# Patient Record
Sex: Male | Born: 1975
Health system: Southern US, Community
[De-identification: ages and names within clinical notes are randomized; demographics above are authoritative.]

## PROBLEM LIST (undated history)

## (undated) DIAGNOSIS — G4733 Obstructive sleep apnea (adult) (pediatric): Secondary | ICD-10-CM

## (undated) DIAGNOSIS — Z8619 Personal history of other infectious and parasitic diseases: Secondary | ICD-10-CM

## (undated) DIAGNOSIS — K5090 Crohn's disease, unspecified, without complications: Secondary | ICD-10-CM

## (undated) DIAGNOSIS — F419 Anxiety disorder, unspecified: Secondary | ICD-10-CM

## (undated) DIAGNOSIS — F329 Major depressive disorder, single episode, unspecified: Secondary | ICD-10-CM

## (undated) DIAGNOSIS — F32A Depression, unspecified: Secondary | ICD-10-CM

## (undated) DIAGNOSIS — K439 Ventral hernia without obstruction or gangrene: Secondary | ICD-10-CM

## (undated) DIAGNOSIS — K603 Anal fistula: Secondary | ICD-10-CM

## (undated) DIAGNOSIS — Z87442 Personal history of urinary calculi: Secondary | ICD-10-CM

## (undated) DIAGNOSIS — I10 Essential (primary) hypertension: Secondary | ICD-10-CM

## (undated) DIAGNOSIS — K9089 Other intestinal malabsorption: Secondary | ICD-10-CM

## (undated) DIAGNOSIS — E785 Hyperlipidemia, unspecified: Secondary | ICD-10-CM

## (undated) DIAGNOSIS — K602 Anal fissure, unspecified: Secondary | ICD-10-CM

## (undated) DIAGNOSIS — Z9049 Acquired absence of other specified parts of digestive tract: Secondary | ICD-10-CM

## (undated) HISTORY — DX: Major depressive disorder, single episode, unspecified: F32.9

## (undated) HISTORY — DX: Depression, unspecified: F32.A

## (undated) HISTORY — DX: Anxiety disorder, unspecified: F41.9

## (undated) HISTORY — PX: HYPOSPADIAS CORRECTION: SHX483

## (undated) HISTORY — DX: Other intestinal malabsorption: K90.89

## (undated) HISTORY — DX: Hyperlipidemia, unspecified: E78.5

## (undated) HISTORY — PX: COLONOSCOPY: SHX174

## (undated) HISTORY — DX: Essential (primary) hypertension: I10

## (undated) HISTORY — DX: Ventral hernia without obstruction or gangrene: K43.9

## (undated) HISTORY — PX: OTHER SURGICAL HISTORY: SHX169

---

## 2005-10-14 ENCOUNTER — Ambulatory Visit: Payer: Self-pay | Admitting: Gastroenterology

## 2005-10-29 ENCOUNTER — Ambulatory Visit: Payer: Self-pay | Admitting: Internal Medicine

## 2005-11-17 ENCOUNTER — Ambulatory Visit: Payer: Self-pay | Admitting: Internal Medicine

## 2006-07-27 ENCOUNTER — Ambulatory Visit: Payer: Self-pay | Admitting: Internal Medicine

## 2006-07-27 LAB — CONVERTED CEMR LAB
AST: 19 units/L (ref 0–37)
Albumin: 3.5 g/dL (ref 3.5–5.2)
Alkaline Phosphatase: 80 units/L (ref 39–117)
Basophils Absolute: 0 10*3/uL (ref 0.0–0.1)
Basophils Relative: 0 % (ref 0.0–1.0)
Bilirubin, Direct: 0.1 mg/dL (ref 0.0–0.3)
CO2: 29 meq/L (ref 19–32)
CRP, High Sensitivity: 25 — ABNORMAL HIGH (ref 0.00–5.00)
Creatinine, Ser: 0.7 mg/dL (ref 0.4–1.5)
GFR calc non Af Amer: 140 mL/min
Lymphocytes Relative: 15.5 % (ref 12.0–46.0)
Monocytes Relative: 11.7 % — ABNORMAL HIGH (ref 3.0–11.0)
Neutrophils Relative %: 70.1 % (ref 43.0–77.0)
Platelets: 207 10*3/uL (ref 150–400)
Potassium: 4 meq/L (ref 3.5–5.1)
RBC: 4.52 M/uL (ref 4.22–5.81)
Sodium: 141 meq/L (ref 135–145)
Total Bilirubin: 0.7 mg/dL (ref 0.3–1.2)

## 2006-07-29 ENCOUNTER — Encounter: Admission: RE | Admit: 2006-07-29 | Discharge: 2006-07-29 | Payer: Self-pay | Admitting: Internal Medicine

## 2006-07-29 ENCOUNTER — Encounter: Payer: Self-pay | Admitting: Internal Medicine

## 2006-09-28 ENCOUNTER — Encounter (HOSPITAL_COMMUNITY): Admission: RE | Admit: 2006-09-28 | Discharge: 2006-12-23 | Payer: Self-pay

## 2006-10-19 ENCOUNTER — Encounter (INDEPENDENT_AMBULATORY_CARE_PROVIDER_SITE_OTHER): Payer: Self-pay | Admitting: *Deleted

## 2006-10-20 ENCOUNTER — Inpatient Hospital Stay (HOSPITAL_COMMUNITY): Admission: EM | Admit: 2006-10-20 | Discharge: 2006-10-25 | Payer: Self-pay | Admitting: Emergency Medicine

## 2007-03-07 ENCOUNTER — Encounter (HOSPITAL_COMMUNITY): Admission: RE | Admit: 2007-03-07 | Discharge: 2007-04-29 | Payer: Self-pay | Admitting: Internal Medicine

## 2007-07-01 ENCOUNTER — Emergency Department (HOSPITAL_COMMUNITY): Admission: EM | Admit: 2007-07-01 | Discharge: 2007-07-02 | Payer: Self-pay | Admitting: Emergency Medicine

## 2007-07-11 ENCOUNTER — Encounter (HOSPITAL_COMMUNITY): Admission: RE | Admit: 2007-07-11 | Discharge: 2007-10-09 | Payer: Self-pay

## 2007-07-20 ENCOUNTER — Ambulatory Visit: Payer: Self-pay | Admitting: Internal Medicine

## 2007-07-20 DIAGNOSIS — K5080 Crohn's disease of both small and large intestine without complications: Secondary | ICD-10-CM | POA: Insufficient documentation

## 2007-07-20 DIAGNOSIS — I10 Essential (primary) hypertension: Secondary | ICD-10-CM | POA: Insufficient documentation

## 2007-07-20 DIAGNOSIS — E669 Obesity, unspecified: Secondary | ICD-10-CM | POA: Insufficient documentation

## 2007-07-20 DIAGNOSIS — G4733 Obstructive sleep apnea (adult) (pediatric): Secondary | ICD-10-CM | POA: Insufficient documentation

## 2007-07-20 DIAGNOSIS — F418 Other specified anxiety disorders: Secondary | ICD-10-CM | POA: Insufficient documentation

## 2007-07-20 DIAGNOSIS — G4700 Insomnia, unspecified: Secondary | ICD-10-CM | POA: Insufficient documentation

## 2007-07-20 LAB — CONVERTED CEMR LAB
ALT: 30 units/L (ref 0–53)
AST: 21 units/L (ref 0–37)
Alkaline Phosphatase: 69 units/L (ref 39–117)
Basophils Absolute: 0 10*3/uL (ref 0.0–0.1)
Calcium: 9.3 mg/dL (ref 8.4–10.5)
Chloride: 102 meq/L (ref 96–112)
Eosinophils Absolute: 0.1 10*3/uL (ref 0.0–0.6)
Eosinophils Relative: 0.8 % (ref 0.0–5.0)
GFR calc non Af Amer: 104 mL/min
HCT: 43.3 % (ref 39.0–52.0)
Hemoglobin: 15 g/dL (ref 13.0–17.0)
Hepatitis B Surface Ag: NEGATIVE
MCV: 87.3 fL (ref 78.0–100.0)
Monocytes Absolute: 0.7 10*3/uL (ref 0.2–0.7)
Neutro Abs: 5.5 10*3/uL (ref 1.4–7.7)
RBC: 4.96 M/uL (ref 4.22–5.81)
Sodium: 138 meq/L (ref 135–145)
Vit D, 1,25-Dihydroxy: 16 — ABNORMAL LOW (ref 30–89)

## 2007-07-21 ENCOUNTER — Encounter: Payer: Self-pay | Admitting: Internal Medicine

## 2007-07-26 ENCOUNTER — Encounter (HOSPITAL_COMMUNITY): Admission: RE | Admit: 2007-07-26 | Discharge: 2007-10-24 | Payer: Self-pay | Admitting: Internal Medicine

## 2007-08-05 ENCOUNTER — Encounter (INDEPENDENT_AMBULATORY_CARE_PROVIDER_SITE_OTHER): Payer: Self-pay | Admitting: Surgery

## 2007-08-05 ENCOUNTER — Inpatient Hospital Stay (HOSPITAL_COMMUNITY): Admission: EM | Admit: 2007-08-05 | Discharge: 2007-08-13 | Payer: Self-pay | Admitting: Emergency Medicine

## 2007-08-05 ENCOUNTER — Encounter (INDEPENDENT_AMBULATORY_CARE_PROVIDER_SITE_OTHER): Payer: Self-pay | Admitting: *Deleted

## 2007-08-13 ENCOUNTER — Encounter (INDEPENDENT_AMBULATORY_CARE_PROVIDER_SITE_OTHER): Payer: Self-pay | Admitting: *Deleted

## 2007-08-18 ENCOUNTER — Ambulatory Visit: Payer: Self-pay | Admitting: Internal Medicine

## 2007-09-02 ENCOUNTER — Ambulatory Visit: Payer: Self-pay | Admitting: Internal Medicine

## 2007-09-27 ENCOUNTER — Encounter: Admission: RE | Admit: 2007-09-27 | Discharge: 2007-09-27 | Payer: Self-pay | Admitting: Family Medicine

## 2007-10-14 ENCOUNTER — Ambulatory Visit: Payer: Self-pay | Admitting: Internal Medicine

## 2007-10-21 ENCOUNTER — Telehealth: Payer: Self-pay | Admitting: Internal Medicine

## 2007-10-25 ENCOUNTER — Telehealth: Payer: Self-pay | Admitting: Internal Medicine

## 2007-10-26 ENCOUNTER — Encounter: Payer: Self-pay | Admitting: Internal Medicine

## 2007-10-31 ENCOUNTER — Emergency Department (HOSPITAL_COMMUNITY): Admission: EM | Admit: 2007-10-31 | Discharge: 2007-11-01 | Payer: Self-pay | Admitting: Emergency Medicine

## 2007-11-01 ENCOUNTER — Encounter (INDEPENDENT_AMBULATORY_CARE_PROVIDER_SITE_OTHER): Payer: Self-pay | Admitting: *Deleted

## 2007-12-28 ENCOUNTER — Encounter: Payer: Self-pay | Admitting: Internal Medicine

## 2008-01-02 ENCOUNTER — Ambulatory Visit: Payer: Self-pay | Admitting: Internal Medicine

## 2008-01-03 ENCOUNTER — Telehealth: Payer: Self-pay | Admitting: Internal Medicine

## 2008-01-04 ENCOUNTER — Encounter: Payer: Self-pay | Admitting: Internal Medicine

## 2008-01-04 ENCOUNTER — Encounter (HOSPITAL_COMMUNITY): Admission: RE | Admit: 2008-01-04 | Discharge: 2008-02-15 | Payer: Self-pay | Admitting: Internal Medicine

## 2008-01-18 ENCOUNTER — Encounter: Payer: Self-pay | Admitting: Internal Medicine

## 2008-02-15 ENCOUNTER — Encounter: Payer: Self-pay | Admitting: Internal Medicine

## 2008-04-16 ENCOUNTER — Encounter (HOSPITAL_COMMUNITY): Admission: RE | Admit: 2008-04-16 | Discharge: 2008-07-15 | Payer: Self-pay | Admitting: Internal Medicine

## 2008-04-16 ENCOUNTER — Encounter: Payer: Self-pay | Admitting: Internal Medicine

## 2008-04-30 ENCOUNTER — Telehealth: Payer: Self-pay | Admitting: Internal Medicine

## 2008-05-29 ENCOUNTER — Encounter: Payer: Self-pay | Admitting: Internal Medicine

## 2008-06-21 ENCOUNTER — Telehealth (INDEPENDENT_AMBULATORY_CARE_PROVIDER_SITE_OTHER): Payer: Self-pay

## 2008-06-29 ENCOUNTER — Encounter (INDEPENDENT_AMBULATORY_CARE_PROVIDER_SITE_OTHER): Payer: Self-pay

## 2008-07-06 ENCOUNTER — Ambulatory Visit: Payer: Self-pay | Admitting: Internal Medicine

## 2008-07-10 ENCOUNTER — Encounter: Payer: Self-pay | Admitting: Internal Medicine

## 2008-09-03 ENCOUNTER — Encounter (HOSPITAL_COMMUNITY): Admission: RE | Admit: 2008-09-03 | Discharge: 2008-12-02 | Payer: Self-pay | Admitting: Internal Medicine

## 2008-09-04 ENCOUNTER — Encounter: Payer: Self-pay | Admitting: Internal Medicine

## 2008-10-16 ENCOUNTER — Encounter: Payer: Self-pay | Admitting: Internal Medicine

## 2008-11-06 ENCOUNTER — Telehealth (INDEPENDENT_AMBULATORY_CARE_PROVIDER_SITE_OTHER): Payer: Self-pay

## 2008-11-27 ENCOUNTER — Encounter: Payer: Self-pay | Admitting: Internal Medicine

## 2008-12-01 ENCOUNTER — Telehealth: Payer: Self-pay | Admitting: Internal Medicine

## 2008-12-18 ENCOUNTER — Encounter (INDEPENDENT_AMBULATORY_CARE_PROVIDER_SITE_OTHER): Payer: Self-pay

## 2009-01-21 ENCOUNTER — Telehealth: Payer: Self-pay | Admitting: Internal Medicine

## 2009-01-24 ENCOUNTER — Ambulatory Visit: Payer: Self-pay | Admitting: Internal Medicine

## 2009-01-24 LAB — CONVERTED CEMR LAB
AST: 31 units/L (ref 0–37)
Alkaline Phosphatase: 71 units/L (ref 39–117)
Basophils Absolute: 0 10*3/uL (ref 0.0–0.1)
Basophils Relative: 0.9 % (ref 0.0–3.0)
HCT: 43.6 % (ref 39.0–52.0)
MCHC: 34.5 g/dL (ref 30.0–36.0)
Monocytes Absolute: 0.7 10*3/uL (ref 0.1–1.0)
Neutrophils Relative %: 56.5 % (ref 43.0–77.0)
Platelets: 202 10*3/uL (ref 150.0–400.0)

## 2009-01-25 ENCOUNTER — Ambulatory Visit: Payer: Self-pay | Admitting: Internal Medicine

## 2009-01-30 ENCOUNTER — Encounter (HOSPITAL_COMMUNITY): Admission: RE | Admit: 2009-01-30 | Discharge: 2009-04-30 | Payer: Self-pay | Admitting: Internal Medicine

## 2009-01-31 ENCOUNTER — Encounter: Payer: Self-pay | Admitting: Internal Medicine

## 2009-02-16 IMAGING — CT CT ABDOMEN W/O CM
2 of 4 series · 16 of 46 positions shown, 18 images · non-contrast
Comparison: CT abdomen pelvis with contrast 08/05/2007.

CT ABDOMEN

CLINICAL DATA: 31-year-old male history of Crohn disease with
recent abdominal surgery.  Suprapubic pain and difficulty with
urination.  Micro hematuria.

CT ABDOMEN AND PELVIS WITHOUT CONTRAST
TECHNIQUE: Multidetector CT imaging of the abdomen and pelvis was
performed following the standard
protocol without intravenous contrast.

[Series 2: 220 stone 5.0 b40f st · axial · 0.75mm/px · z∈[-391,-61]mm · 13 of 72 slices shown, 15 images]
[im 3/72  soft-tissue]
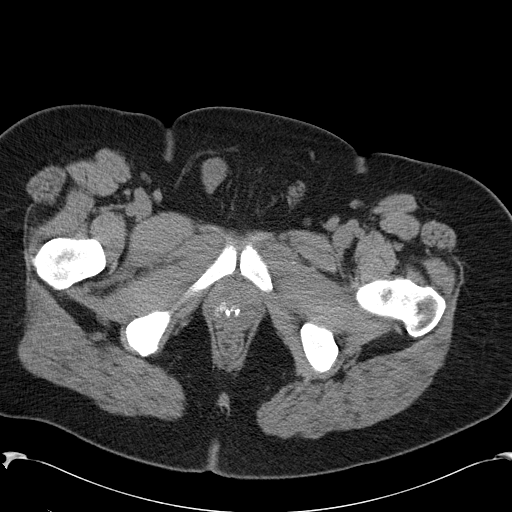
[im 3/72  bone]
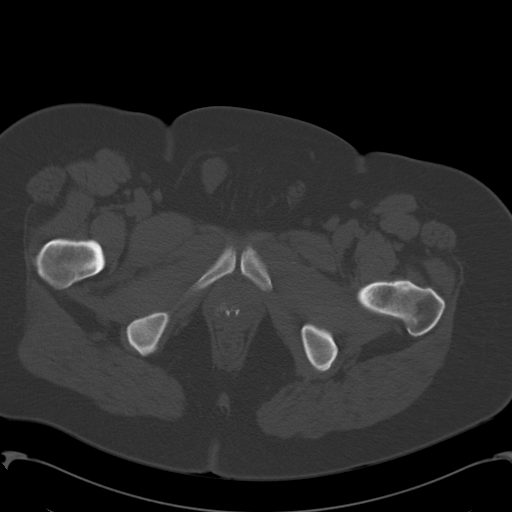
[im 9/72  soft-tissue]
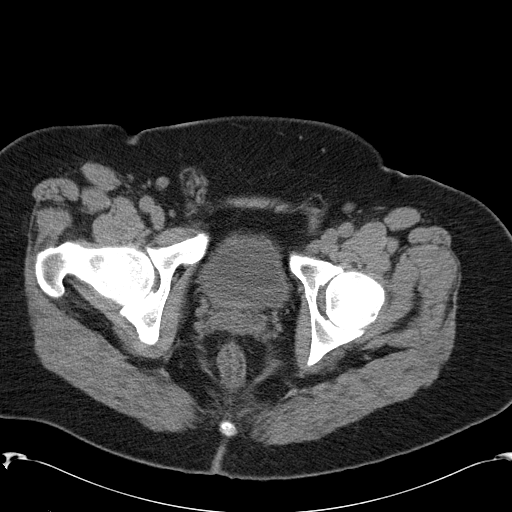
[im 14/72  soft-tissue]
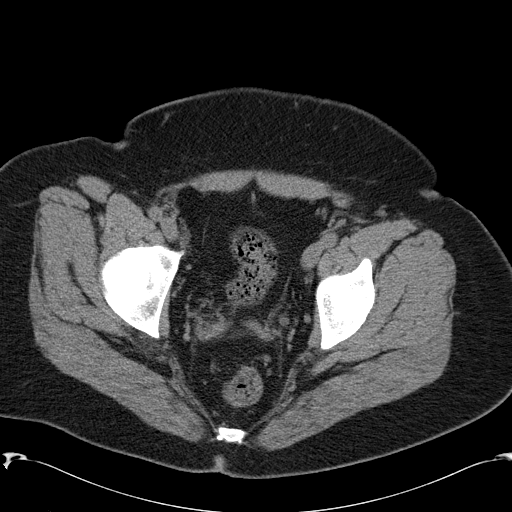
[im 20/72  soft-tissue]
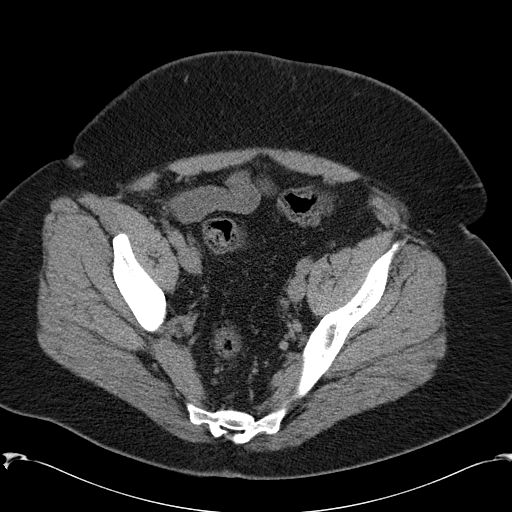
[im 25/72  soft-tissue]
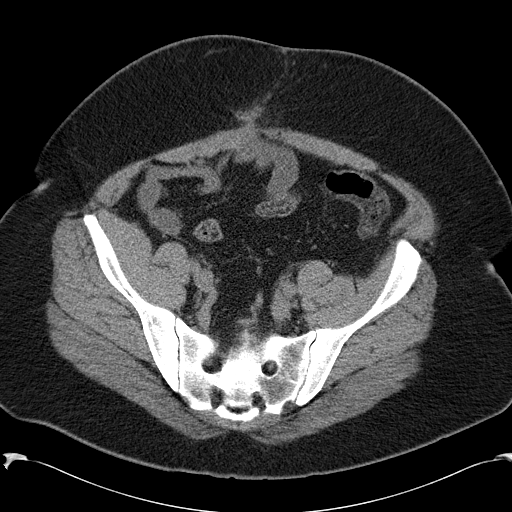
[im 31/72  soft-tissue]
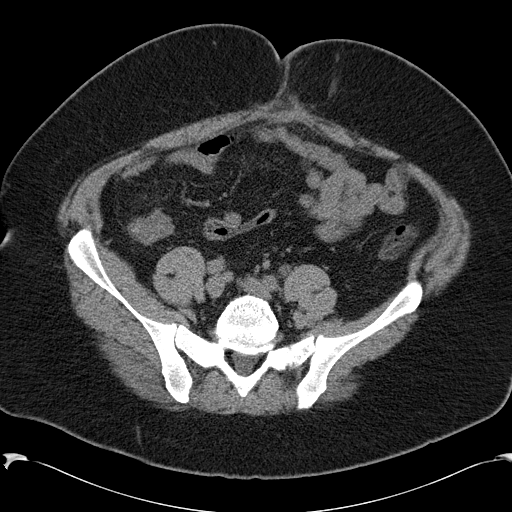
[im 36/72  soft-tissue]
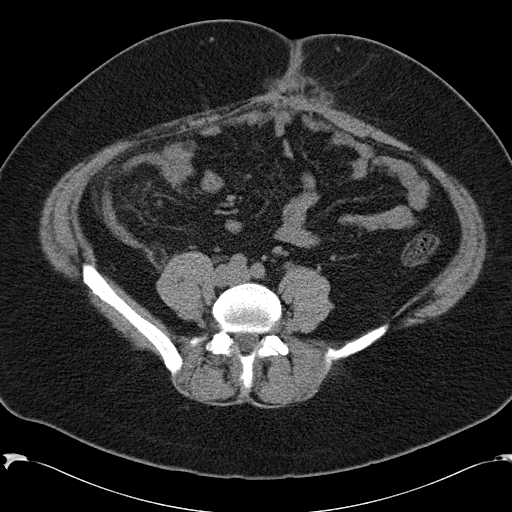
[im 41/72  soft-tissue]
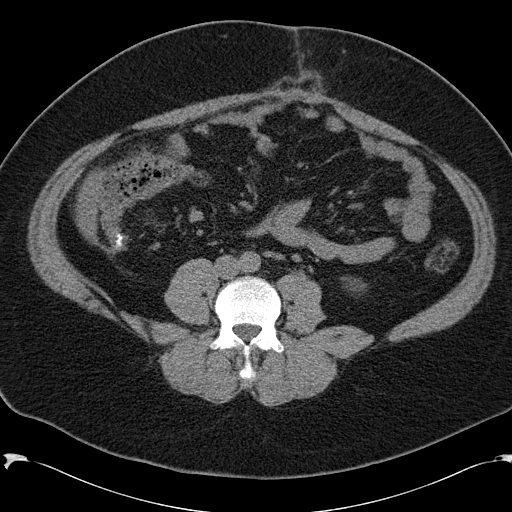
[im 47/72  soft-tissue]
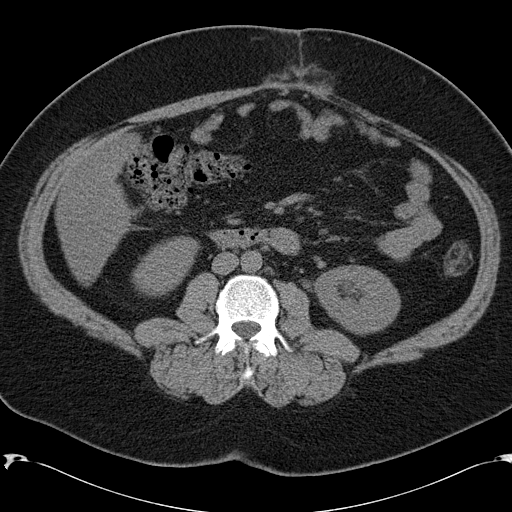
[im 47/72  bone]
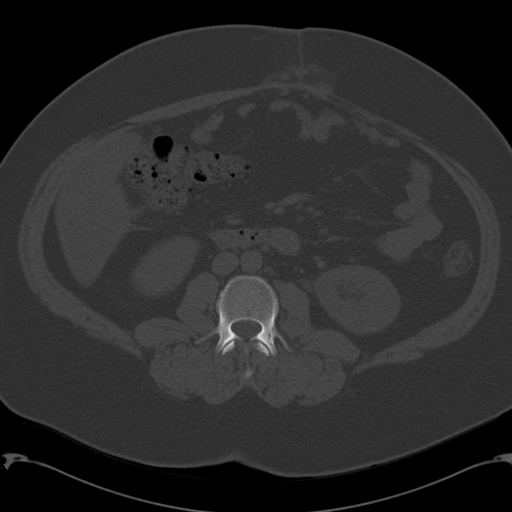
[im 52/72  soft-tissue]
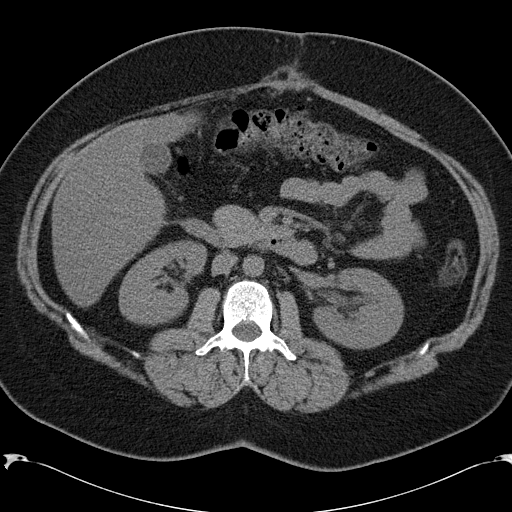
[im 58/72  soft-tissue]
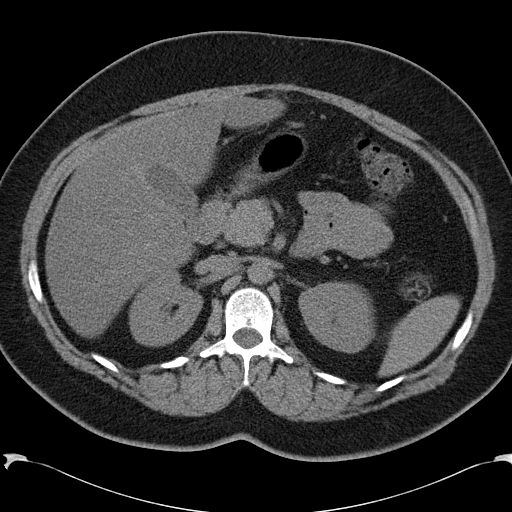
[im 63/72  soft-tissue]
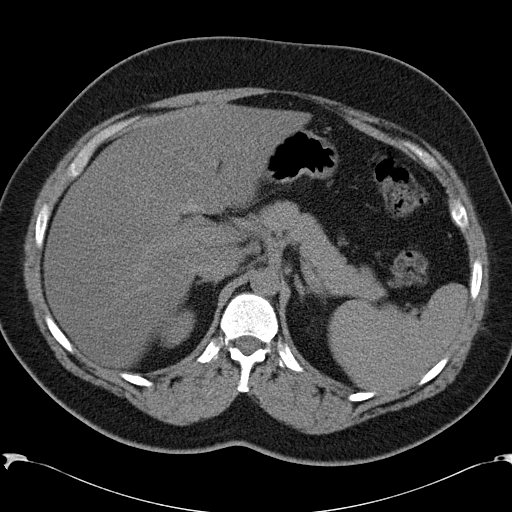
[im 69/72  soft-tissue]
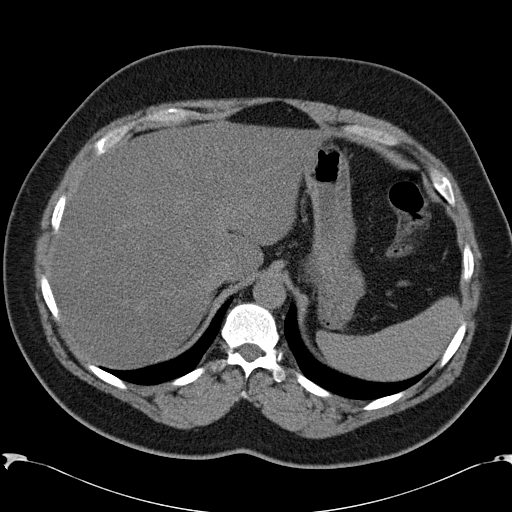

[Series 602: <mpr thick range> · coronal · 0.75mm/px · 3 of 87 slices shown]
[im 29/87  soft-tissue]
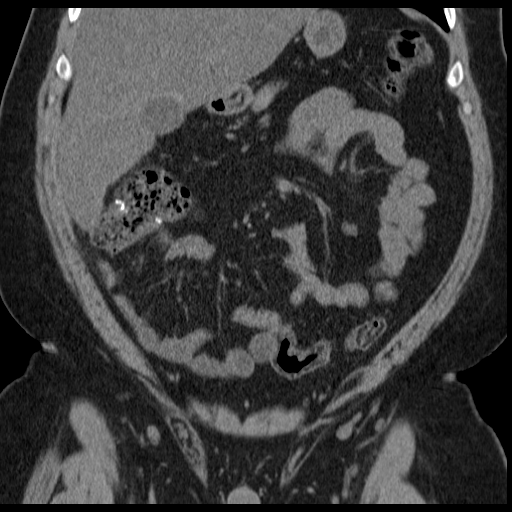
[im 39/87  soft-tissue]
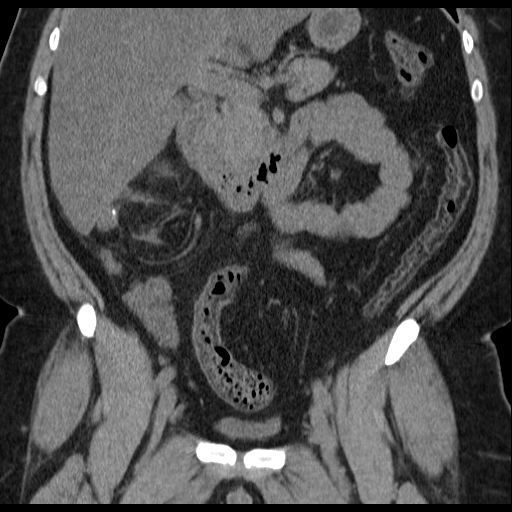
[im 48/87  soft-tissue]
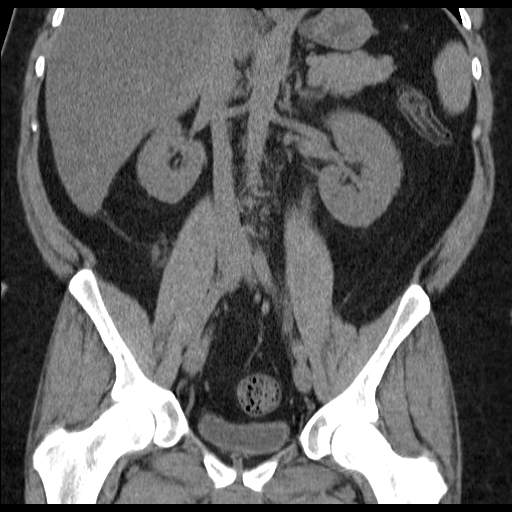

[16 of 46 positions shown; findings below may reference images not displayed]

FINDINGS: Visualized lung bases are clear.  No osseous
abnormality.  Stable hepatic steatosis.  Otherwise visualized
noncontrasted liver, gallbladder, spleen, pancreas, adrenal glands,
stomach, duodenum, proximal small bowel loops, right kidney and
proximal right ureter are normal.

There is mild to moderate left hydronephrosis and left hydroureter
proximally with periureteric stranding.  See pelvis findings below.

No free fluid.  Postoperative changes to the ventral abdomen
without abnormal fluid collection.  New postoperative changes to
the ascending colon likely reflecting creation of any the terminal
ileum.  No dilated bowel.  Chronic mucosal hypertrophy in the
distal colon unchanged and compatible with sequelae of chronic
inflammatory bowel disease.  No focal inflammatory changes in the
mesentery.
IMPRESSION: 1.  Acute urologic obstruction on the left.  See pelvis findings
below.
2.  Interval postoperative changes to the abdomen with no adverse
features.
3.  Hepatic steatosis.

CT PELVIS
FINDINGS: The distal left ureter is dilated.  There is
periureteric stranding.  There is a three - 4 mm calculus at the
left ureterovesicle junction.  The bladder is otherwise normal.
The distal right ureter is normal.

Distal colon is stable in appearance with some chronic mucosal
hypertrophy which may reflect sequelae of inflammatory bowel
disease.  No free fluid.  No acute osseous abnormality.
IMPRESSION: 1.  3 - 4 mm obstructing ureteral stone is at the left UVJ.

2.  Otherwise no acute findings in the pelvis.

## 2009-03-15 ENCOUNTER — Emergency Department (HOSPITAL_COMMUNITY): Admission: EM | Admit: 2009-03-15 | Discharge: 2009-03-15 | Payer: Self-pay | Admitting: Emergency Medicine

## 2009-03-15 ENCOUNTER — Encounter (INDEPENDENT_AMBULATORY_CARE_PROVIDER_SITE_OTHER): Payer: Self-pay | Admitting: *Deleted

## 2009-03-21 ENCOUNTER — Encounter: Payer: Self-pay | Admitting: Internal Medicine

## 2009-03-21 ENCOUNTER — Telehealth (INDEPENDENT_AMBULATORY_CARE_PROVIDER_SITE_OTHER): Payer: Self-pay

## 2009-04-24 ENCOUNTER — Encounter: Payer: Self-pay | Admitting: Internal Medicine

## 2009-04-24 ENCOUNTER — Telehealth: Payer: Self-pay | Admitting: Internal Medicine

## 2009-05-01 ENCOUNTER — Encounter (HOSPITAL_COMMUNITY): Admission: RE | Admit: 2009-05-01 | Discharge: 2009-06-21 | Payer: Self-pay | Admitting: Internal Medicine

## 2009-05-02 ENCOUNTER — Encounter: Payer: Self-pay | Admitting: Internal Medicine

## 2009-06-18 ENCOUNTER — Encounter: Payer: Self-pay | Admitting: Internal Medicine

## 2009-06-24 ENCOUNTER — Telehealth (INDEPENDENT_AMBULATORY_CARE_PROVIDER_SITE_OTHER): Payer: Self-pay

## 2009-06-26 ENCOUNTER — Ambulatory Visit: Payer: Self-pay | Admitting: Internal Medicine

## 2009-07-29 ENCOUNTER — Encounter (HOSPITAL_COMMUNITY): Admission: RE | Admit: 2009-07-29 | Discharge: 2009-10-27 | Payer: Self-pay | Admitting: Internal Medicine

## 2009-07-31 ENCOUNTER — Ambulatory Visit: Payer: Self-pay | Admitting: Internal Medicine

## 2009-08-04 ENCOUNTER — Telehealth: Payer: Self-pay | Admitting: Gastroenterology

## 2009-08-05 ENCOUNTER — Telehealth: Payer: Self-pay | Admitting: Internal Medicine

## 2009-08-07 ENCOUNTER — Telehealth (INDEPENDENT_AMBULATORY_CARE_PROVIDER_SITE_OTHER): Payer: Self-pay

## 2009-08-14 ENCOUNTER — Encounter: Payer: Self-pay | Admitting: Internal Medicine

## 2009-09-03 ENCOUNTER — Telehealth (INDEPENDENT_AMBULATORY_CARE_PROVIDER_SITE_OTHER): Payer: Self-pay | Admitting: *Deleted

## 2009-09-25 ENCOUNTER — Encounter: Payer: Self-pay | Admitting: Internal Medicine

## 2009-10-14 ENCOUNTER — Telehealth: Payer: Self-pay | Admitting: Internal Medicine

## 2009-11-05 ENCOUNTER — Encounter (HOSPITAL_COMMUNITY): Admission: RE | Admit: 2009-11-05 | Discharge: 2010-02-03 | Payer: Self-pay | Admitting: Internal Medicine

## 2009-11-06 ENCOUNTER — Encounter: Payer: Self-pay | Admitting: Internal Medicine

## 2009-11-29 ENCOUNTER — Ambulatory Visit: Payer: Self-pay | Admitting: Internal Medicine

## 2009-11-29 LAB — CONVERTED CEMR LAB
ALT: 33 units/L (ref 0–53)
AST: 28 units/L (ref 0–37)
Albumin: 4.1 g/dL (ref 3.5–5.2)
Alkaline Phosphatase: 72 units/L (ref 39–117)
Basophils Relative: 1 % (ref 0.0–3.0)
CRP, High Sensitivity: 2.48 (ref 0.00–5.00)
Eosinophils Absolute: 0.1 10*3/uL (ref 0.0–0.7)
GFR calc non Af Amer: 132.24 mL/min (ref >60)
Glucose, Bld: 87 mg/dL (ref 70–99)
HCT: 44.3 % (ref 39.0–52.0)
Hemoglobin: 15.2 g/dL (ref 13.0–17.0)
MCHC: 34.3 g/dL (ref 30.0–36.0)
MCV: 92 fL (ref 78.0–100.0)
Monocytes Absolute: 0.8 10*3/uL (ref 0.1–1.0)
Neutrophils Relative %: 60.9 % (ref 43.0–77.0)
Platelets: 216 10*3/uL (ref 150.0–400.0)
RBC: 4.81 M/uL (ref 4.22–5.81)
RDW: 14.4 % (ref 11.5–14.6)
Sodium: 142 meq/L (ref 135–145)
Total Bilirubin: 0.5 mg/dL (ref 0.3–1.2)
WBC: 6.8 10*3/uL (ref 4.5–10.5)

## 2009-12-16 ENCOUNTER — Telehealth: Payer: Self-pay | Admitting: Internal Medicine

## 2009-12-16 ENCOUNTER — Ambulatory Visit: Payer: Self-pay | Admitting: Internal Medicine

## 2009-12-18 ENCOUNTER — Telehealth: Payer: Self-pay | Admitting: Internal Medicine

## 2009-12-18 LAB — CONVERTED CEMR LAB
ALT: 39 units/L (ref 0–53)
AST: 44 units/L — ABNORMAL HIGH (ref 0–37)
Albumin: 4.4 g/dL (ref 3.5–5.2)
Alkaline Phosphatase: 60 units/L (ref 39–117)
Bilirubin, Direct: 0.1 mg/dL (ref 0.0–0.3)
Eosinophils Relative: 1.8 % (ref 0.0–5.0)
Lymphocytes Relative: 34.7 % (ref 12.0–46.0)
Lymphs Abs: 2.1 10*3/uL (ref 0.7–4.0)
MCHC: 34.8 g/dL (ref 30.0–36.0)
Monocytes Absolute: 0.7 10*3/uL (ref 0.1–1.0)
Monocytes Relative: 11.2 % (ref 3.0–12.0)
Neutro Abs: 3.1 10*3/uL (ref 1.4–7.7)
RBC: 4.6 M/uL (ref 4.22–5.81)
Total Bilirubin: 0.7 mg/dL (ref 0.3–1.2)
Total Protein: 7.7 g/dL (ref 6.0–8.3)
WBC: 6.1 10*3/uL (ref 4.5–10.5)

## 2009-12-25 ENCOUNTER — Encounter: Payer: Self-pay | Admitting: Internal Medicine

## 2009-12-26 ENCOUNTER — Telehealth: Payer: Self-pay | Admitting: Internal Medicine

## 2009-12-26 ENCOUNTER — Ambulatory Visit: Payer: Self-pay | Admitting: Internal Medicine

## 2009-12-26 LAB — CONVERTED CEMR LAB
ALT: 38 U/L
AST: 42 U/L — ABNORMAL HIGH
Albumin: 4.3 g/dL
Alkaline Phosphatase: 52 U/L
Bilirubin, Direct: 0.1 mg/dL
Total Bilirubin: 0.7 mg/dL
Total Protein: 7.7 g/dL

## 2009-12-27 ENCOUNTER — Telehealth: Payer: Self-pay | Admitting: Internal Medicine

## 2010-01-01 ENCOUNTER — Telehealth: Payer: Self-pay | Admitting: Internal Medicine

## 2010-01-31 ENCOUNTER — Encounter: Payer: Self-pay | Admitting: Internal Medicine

## 2010-02-03 ENCOUNTER — Ambulatory Visit: Payer: Self-pay | Admitting: Internal Medicine

## 2010-02-04 ENCOUNTER — Encounter (HOSPITAL_COMMUNITY)
Admission: RE | Admit: 2010-02-04 | Discharge: 2010-05-05 | Payer: Self-pay | Source: Home / Self Care | Admitting: Internal Medicine

## 2010-02-05 ENCOUNTER — Encounter: Payer: Self-pay | Admitting: Internal Medicine

## 2010-02-18 ENCOUNTER — Ambulatory Visit: Payer: Self-pay | Admitting: Internal Medicine

## 2010-02-18 DIAGNOSIS — R112 Nausea with vomiting, unspecified: Secondary | ICD-10-CM | POA: Insufficient documentation

## 2010-03-17 ENCOUNTER — Telehealth: Payer: Self-pay | Admitting: Internal Medicine

## 2010-03-19 ENCOUNTER — Encounter: Payer: Self-pay | Admitting: Internal Medicine

## 2010-03-24 ENCOUNTER — Telehealth: Payer: Self-pay | Admitting: Internal Medicine

## 2010-04-30 ENCOUNTER — Encounter: Payer: Self-pay | Admitting: Internal Medicine

## 2010-05-05 LAB — CONVERTED CEMR LAB
AST: 45 units/L — ABNORMAL HIGH (ref 0–37)
Albumin: 4.2 g/dL (ref 3.5–5.2)

## 2010-05-14 ENCOUNTER — Telehealth: Payer: Self-pay | Admitting: Internal Medicine

## 2010-05-19 ENCOUNTER — Ambulatory Visit: Payer: Self-pay | Admitting: Internal Medicine

## 2010-05-21 LAB — CONVERTED CEMR LAB
ALT: 26 units/L (ref 0–53)
Albumin: 4 g/dL (ref 3.5–5.2)
Alkaline Phosphatase: 78 units/L (ref 39–117)
Basophils Relative: 0.6 % (ref 0.0–3.0)
Eosinophils Absolute: 0.1 10*3/uL (ref 0.0–0.7)
Eosinophils Relative: 1.7 % (ref 0.0–5.0)
Hemoglobin: 15.1 g/dL (ref 13.0–17.0)
Lymphocytes Relative: 23.6 % (ref 12.0–46.0)
Monocytes Relative: 10.4 % (ref 3.0–12.0)
Neutro Abs: 3.7 10*3/uL (ref 1.4–7.7)
Neutrophils Relative %: 63.7 % (ref 43.0–77.0)
Platelets: 238 10*3/uL (ref 150.0–400.0)
RBC: 4.61 M/uL (ref 4.22–5.81)
Total Bilirubin: 0.4 mg/dL (ref 0.3–1.2)
WBC: 5.8 10*3/uL (ref 4.5–10.5)

## 2010-06-10 ENCOUNTER — Encounter (HOSPITAL_COMMUNITY)
Admission: RE | Admit: 2010-06-10 | Discharge: 2010-07-22 | Payer: Self-pay | Source: Home / Self Care | Attending: Internal Medicine | Admitting: Internal Medicine

## 2010-06-17 ENCOUNTER — Encounter: Payer: Self-pay | Admitting: Internal Medicine

## 2010-06-24 ENCOUNTER — Telehealth (INDEPENDENT_AMBULATORY_CARE_PROVIDER_SITE_OTHER): Payer: Self-pay

## 2010-06-25 ENCOUNTER — Ambulatory Visit
Admission: RE | Admit: 2010-06-25 | Discharge: 2010-06-25 | Payer: Self-pay | Source: Home / Self Care | Attending: Internal Medicine | Admitting: Internal Medicine

## 2010-07-06 ENCOUNTER — Telehealth: Payer: Self-pay | Admitting: Internal Medicine

## 2010-07-08 ENCOUNTER — Telehealth: Payer: Self-pay | Admitting: Internal Medicine

## 2010-07-13 ENCOUNTER — Encounter: Payer: Self-pay | Admitting: Internal Medicine

## 2010-07-22 NOTE — Progress Notes (Signed)
Summary: meds and labwork  Phone Note Call from Patient Call back at Home Phone [phone]   Caller: Patient Call For: Dr. Carlean Purl Reason for Call: Talk to Nurse Summary of Call: has no more refills for Azaphioprine and will be completely out after Sunday... wants to know what he needs to do to get more, f/u appt isnt until August... also confirmed with pt that he can come in this week for labwork Initial call taken by: Allison Shelor,  December 26, 2009 9:08 AM  Follow-up for Phone Call        advised we will send 1 refill until labs are done.  He will come this afternoon for labs.  Refill sent to [facility]. Follow-up by: Stephanie Phillips CMA (AAMA),  December 26, 2009 10:20 AM    Prescriptions: AZATHIOPRINE 50 MG TABS (AZATHIOPRINE) 4 by mouth qd  #120 x 0   Entered by:   Stephanie Phillips CMA (AAMA)   Authorized by:   Nashanti Duquette E Najai Waszak MD, FACG   Signed by:   Stephanie Phillips CMA (AAMA) on 12/26/2009   Method used:   Electronically to        [facility] (retail)       [address]       [place], [place]  [address]       Ph: [phone]       Fax: [phone]   [place]:   [phone]

## 2010-07-22 NOTE — Progress Notes (Signed)
Summary: Remicade  Phone Note Call from Patient Call back at Home Phone [phone]   Caller: Patient Call For: Dr. Carlean Purl Reason for Call: Talk to Nurse Summary of Call: pt would like to resch Remicade schedule Initial call taken by: Lucien Mons,  August 05, 2009 8:54 AM  Follow-up for Phone Call        No further fever, still with some vomiting this am.  Phenergan is helping.  I have asked him to call me back when all vomiting has stopped, afebrile, and feels better to arrange for Remicade. Follow-up by: Barb Merino RN, [place],  August 05, 2009 9:27 AM  Additional Follow-up for Phone Call Additional follow up Details #1::        agree Additional Follow-up by: Gatha Mayer MD, Marval Regal,  August 05, 2009 10:21 AM

## 2010-07-22 NOTE — Assessment & Plan Note (Signed)
Summary: crohn's disease/remicade needs annual PPD  Nurse Visit   Allergies: No Known Drug Allergies  Immunizations Administered:  PPD Skin Test:    Vaccine Type: PPD    Site: right forearm    Mfr: Sanofi Pasteur    Dose: 0.1 ml    Route: ID    Given by: Christian Mate CMA ([place])    Exp. Date: 10/16/2011    Lot #: [id number]  PPD Results    Date of reading: 06/28/2009    Results: < 52m    Interpretation: negative PMarisue HumbleNCMA  June 28, 2009 8:11 AM  Orders Added: 1)  TB Skin Test [86580] 2)  Admin 1st Vaccine [[id number]

## 2010-07-22 NOTE — Progress Notes (Signed)
Summary: Triage  Phone Note Call from Patient Call back at Home Phone [phone]   Caller: Patient Call For: Dr. Carlean Purl Reason for Call: Talk to Nurse Summary of Call: would like to discuss his orders for his bloodwork Initial call taken by: Webb Laws,  December 16, 2009 3:50 PM  Follow-up for Phone Call        Left message for patient to call back [place], CGRN  December 16, 2009 3:59 PM Left message for patient to call back Barb Merino RN, [facility]  December 17, 2009 9:50 AM  patient says his lab question is no longer needed. Barb Merino RN, CGRN  December 17, 2009 10:14 AM

## 2010-07-22 NOTE — Op Note (Signed)
Summary: [facility]    [facility]    Imported By: Rise Patience 05/21/2010 14:55:52  _____________________________________________________________________  External Attachment:    Type:   Image     Comment:   External Document

## 2010-07-22 NOTE — Op Note (Signed)
Summary: Remicade/[place]  [facility]   Imported By: Phillis Knack 12/30/2009 08:28:47  _____________________________________________________________________  External Attachment:    Type:   Image     Comment:   External Document

## 2010-07-22 NOTE — Assessment & Plan Note (Signed)
Summary: Follow up for restarting Imuran/rs   History of Present Illness Visit Type: Follow-up Visit Primary GI MD: Silvano Rusk MD [facility] Primary Provider: Luanne Bras, MD Chief Complaint: Crohn's  History of Present Illness:   35 yo wm with long-standing fistulizing and obstructing  Crohn's disease, s/p right hemi-colectomy and on Remicade Pt. c/o diarrhea x 5 BMs per day with occasional blood. Unable to see primary care due to an old bill issue Divorce is ramping up, separated x 2 years Life is very stressful Cannot sleep, ? anti-depressant needs ramping BP high He can tell wen its time for the Remicade    GI Review of Systems    Reports abdominal pain.      Denies acid reflux, belching, bloating, chest pain, dysphagia with liquids, dysphagia with solids, heartburn, loss of appetite, nausea, vomiting, vomiting blood, weight loss, and  weight gain.      Reports diarrhea and  rectal bleeding.     Denies anal fissure, black tarry stools, change in bowel habit, constipation, diverticulosis, fecal incontinence, heme positive stool, hemorrhoids, irritable bowel syndrome, jaundice, light color stool, liver problems, and  rectal pain. Colonoscopy  Procedure date:  10/14/2007  Findings:      1) MILDLY ACTIVE CROHNS IN NEO-TERMINAL ILEUM AND ANASTAMOSIS 2) CROHN'S COLITIS ALSO WITH SCATTERED, TINY APHTHOUS ULCERS 3) MILD ANAL STENOSIS 4) EXCELLENT PREP     Current Medications (verified): 1)  Paxil 30 Mg  Tabs (Paroxetine Hcl) .... Once Daily 2)  Remicade 100 Mg Solr (Infliximab) .... 5 Mg/kg Every 6 Weeks 3)  Ibuprofen 200 Mg Tabs (Ibuprofen) .... As Needed 4)  Imodium A-D 2 Mg Tabs (Loperamide Hcl) .... Take 1-2 Tablets Every Morning 5)  Promethazine Hcl 12.5 Mg  Tabs (Promethazine Hcl) .... Take One To Two Pills, By Mouth, Every 5-6 Hours As Needed For Nausea  Allergies (verified): No Known Drug Allergies  Past History:  Past Medical History: Reviewed history from  01/25/2009 and no changes required. OBSTRUCTIVE SLEEP APNEA (ICD-327.23) DEPRESSION (ICD-311) ANXIETY (ICD-300.00) ESSENTIAL HYPERTENSION, BENIGN (ICD-401.1) OBESITY, UNSPECIFIED (ICD-278.00) CROHN'S DISEASE SMALL INTESTINE/COLON, FISTULIZING, PERIANAL NEPHROLITHIASIS VIT D DFEFICIENCY  Past Surgical History: Reviewed history from 09/02/2007 and no changes required. hypospadia surgery (childhood) ILEOCECECTOMY FOR CROHN'S STRICTURE/ABSCESS, 08/05/07  Family History: Reviewed history from 07/31/2009 and no changes required. Family History of Heart Disease: Father No FH of Colon Cancer:  Social History: Reviewed history from 01/25/2009 and no changes required. Occupation:  [place] Patient has never smoked.  Alcohol Use - no Daily Caffeine Use-5 drinks per day Illicit Drug Use - no Patient does not get regular exercise.  Married, children  Review of Systems       not suicidal  Vital Signs:  Patient profile:   35 year old male Height:      68 inches Weight:      232 pounds BMI:     35.40 Pulse rate:   84 / minute Pulse rhythm:   regular BP sitting:   150 / 98  (left arm)  Vitals Entered By: Randye Lobo NCMA (November 29, 2009 9:48 AM)  Physical Exam  General:  Well developed, well nourished, no acute distress.obese.   Eyes:  anicteric Lungs:  Clear throughout to auscultation. Heart:  Regular rate and rhythm; no murmurs, rubs,  or bruits. Abdomen:  Soft, nontender and nondistended. No masses, hepatosplenomegaly or hernias noted. Normal bowel sounds. low midline surgical scar Psych:  Alert and cooperative. Normal mood and affect.   Impression & Recommendations:  Problem # 1:  CROHN'S DISEASE, LARGE AND SMALL INTESTINES (ICD-555.2) Assessment Deteriorated Diagnosed 2005, has been on azathioprine and Remicade on and off On remicade monotherapy now since 2009 right hemicolectomy (perforation) and recuuernt perianal fistula 9controlled0 Having some diarrhea  which could be IBS, post-resection and/or Crohn's Will add back Imuran at prior dose of 200 mg once daily (90m/kg) as immunomodulator and remicade shown to have better outcomes than monoTx   Orders: TLB-CBC Platelet - w/Differential (85025-CBCD) TLB-CRP-High Sensitivity (C-Reactive Protein) (86140-FCRP) TLB-CMP (Comprehensive Metabolic Pnl) (802111-BZ[facility]  Problem # 2:  LONG-TERM USE OF MEDICATIONS: AZATHIOPRINE, REMICADE (ICD-V58.69) Assessment: Unchanged adding back Imuran 200 mg daily (22mkg)'he is aware of need for monitoring no live vaccines  Problem # 3:  ANXIETY (ICD-300.00) Assessment: Deteriorated increase paxil to 409 mg once daily (prior dose) add at bedtime as needed clonazepam1m58mor insomnia and anxiety situational stressors of divorce undoubtedly playing a role  Problem # 4:  DEPRESSION (ICD-311) Assessment: Unchanged not suicidal increase Paxil 30 to 40 mg daily  Patient Instructions: 1)  Please go to the basement to have your lab tests drawn today.  2)  Please pick up your medications at your pharmacy.  3)  Restart Azathioprine is directed below. 4)  Please return in 2 weeks for labs. 5)  Please schedule a follow-up appointment in 2 months.  6)  The medication list was reviewed and reconciled.  All changed / newly prescribed medications were explained.  A complete medication list was provided to the patient / caregiver. Prescriptions: PAROXETINE HCL 40 MG TABS (PAROXETINE HCL) 1 by mouth once daily  #30 x 5   Entered and Authorized by:   CarGatha Mayer, FACUc RegentsSigned by:   CarGatha Mayer, FACSaint ALPhons[facility] 11/29/2009   Method used:   Electronically to        [facility] [place])       [address]     [place]  [id number]    Ph: [phone]    Fax: [phone]:   [id number] 50 MG TABS (AZATHIOPRINE) 4 by mouth qd  #120 x 0   Entered and Authorized by:   CarGatha Mayer, FACTirr [facility] Signed by:   CarGatha Mayer, FACG on 11/29/2009   Method used:   Electronically to        [facility] (retail)       [address]     [place]  [id number]    Ph: [phone]    Fax: [phone]:   [id number] 1 MG TABS (CLONAZEPAM) 1 by mouth at bedtime as needed to sleep  #30 x 0   Entered and Authorized by:   CarGatha Mayer, FACNovamed Surgery Center Of NashuaSigned by:   CarGatha Mayer, FACG on 11/29/2009   Method used:   Printed then faxed to ...       [facility] [place])       [address]     [place]  [id number]    Ph: [phone]    Fax: [phone]:   [id number]

## 2010-07-22 NOTE — Progress Notes (Signed)
Summary: Follow up appointment  Phone Note Outgoing Call Call back at Home Phone [phone]   Call placed by: Genella Mech CMA Deborra Medina),  October 14, 2009 8:31 AM Summary of Call: Called pt to schedule a follow up appointment for June/July. Waiting on pt to R/C. Initial call taken by: Genella Mech CMA Deborra Medina),  October 14, 2009 8:31 AM  Follow-up for Phone Call        Pt returned call, He has a follow up appointment with Dr Carlean Purl on 11/29/2009 at 9:45am. Follow-up by: Genella Mech CMA Deborra Medina),  October 14, 2009 8:35 AM

## 2010-07-22 NOTE — Progress Notes (Signed)
Summary: FMLA document for completion from the [facility] document for completion from the [place]. Request forwarded to [employer]. Dena Chavis  September 03, 2009 12:10 PM

## 2010-07-22 NOTE — Op Note (Signed)
Summary: Remicade/[facility]   Imported By: Phillis Knack 08/19/2009 10:41:00  _____________________________________________________________________  External Attachment:    Type:   Image     Comment:   External Document

## 2010-07-22 NOTE — Progress Notes (Signed)
Summary: clonazepam, STD testing?  ---- Converted from flag ---- ---- 12/18/2009 9:52 AM, Barb Merino RN, CGRN wrote: he is requesting he get some labs for STD , HIV  because he is going through a divorce ------------------------------  Pt called back and stated he is using clonazepam for sleep.  Some nights he will need to use 2 tabs.  Pt wants to know if he should continue with this or is there another option.  States Dr. Carlean Purl mentioned we may need to try somehting else. Pt states he is OK with taking 2 clonazepam.  If this is the continued plan he will need refill called to [facility]. [place] Deborra Medina)  December 18, 2009 4:00 PM    Phone Note Call from Patient Call back at Work Phone [phone]      Additional Follow-up for Phone Call Additional follow up Details #2::    for now Clonazepam 53m tabs 1/2-1 at bedtime as needed sleep #30 1 refill re: STD testing we can do an HIV but if no genital lesions or symptoms would not do others - does he have specific concerns for other diseases? Follow-up by: CGatha MayerMD, FMarval Regal  December 18, 2009 6:32 PM  Additional Follow-up for Phone Call Additional follow up Details #3:: Details for Additional Follow-up Action Taken: [facility] work vAdvertising account executive  SAbelino DerrickCMA (Deborra Medina  December 19, 2009 1:31 PM  I spoke to pt and advised him of the above.  He is agreeable with that plan. He is not having any symptoms.  He is going through a divorse and wanted to be checked before entering the dating scene again.  Advised pt I would add HIV test to next labs and send rx for clonazepam to pharmacy. Additional Follow-up by: SAbelino DerrickCMA (Deborra Medina,  December 20, 2009 9:31 AM  New/Updated Medications: CLONAZEPAM 2 MG TABS (CLONAZEPAM) take 1/2-1 tablet by mouth at bedtime for sleep Prescriptions: CLONAZEPAM 2 MG TABS (CLONAZEPAM) take 1/2-1 tablet by mouth at bedtime for sleep  #30 x 1   Entered by:   SAbelino DerrickCMA (ABethany   Authorized by:   CGatha MayerMD, FPutnam County Hospital  Signed by:   SAbelino DerrickCMA (ASarasota on 12/20/2009   Method used:   Printed then [facility]ed [facility] ...       [facility]       [address]       [place]  [address]      Ph: [phone]      Fax: [phone]  [place]   [phone]

## 2010-07-22 NOTE — Progress Notes (Signed)
Summary: Renmicade orders and elevated BP  Phone Note Outgoing Call   Summary of Call: Let him know BP was elevated at remicade infusion 04/30/10 148/102, 156/96. he may need treatment but would need other measurements. He should measure this at home (can do in a drugstore) and sle f/u PCP about this.  He also needs a new set of orders for his Remicade, next due 06/11/10, continue with same dose and schedule Gatha Mayer MD, [facility]  May 14, 2010 4:28 PM   Follow-up for Phone Call        LM to [facility] on work voicemail, updated orders.  Abelino Derrick CMA Deborra Medina)  May 19, 2010 2:27 PM  LM to [facility] on work voicemail Abelino Derrick CMA Deborra Medina)  May 20, 2010 11:31 AM   advised pt of above.  He is agreeable and will follow up with PCP.  Also reminded pt that he will be due for  PPD in January and we will call him. Follow-up by: Abelino Derrick CMA Deborra Medina),  May 21, 2010 8:27 AM

## 2010-07-22 NOTE — Op Note (Signed)
Summary: Remicade/[place]  Remicade/   Imported By: Phillis Knack 02/13/2010 14:18:03  _____________________________________________________________________  External Attachment:    Type:   Image     Comment:   External Document

## 2010-07-22 NOTE — Assessment & Plan Note (Signed)
Summary: 2 MO F/U.Marland KitchenAM   History of Present Illness Visit Type: Follow-up Visit Primary GI MD: Silvano Rusk MD [facility] Primary Aby Gessel: Luanne Bras, MD Chief Complaint: Follow up Crohns History of Present Illness:   35 yo wm with Crohn's ileo-colitis on Remicade and azathioprine. He restarted azathioprine in June and is here for reassessment. His AST has been mildly elevated since restarting azathioprine. Patient complains of nausea and vomiting every morning for about 2-3 weeks.  May happen when he is brushing teeth or a certain scent. He only produces a small amount of mucous. He is under stress with divorce proceedings.  No diarrhea or abdominal pain.   GI Review of Systems    Reports nausea and  vomiting.      Denies abdominal pain, acid reflux, belching, bloating, chest pain, dysphagia with liquids, dysphagia with solids, heartburn, loss of appetite, vomiting blood, weight loss, and  weight gain.        Denies anal fissure, black tarry stools, change in bowel habit, constipation, diarrhea, diverticulosis, fecal incontinence, heme positive stool, hemorrhoids, irritable bowel syndrome, jaundice, light color stool, liver problems, rectal bleeding, and  rectal pain.    Current Medications (verified): 1)  Paroxetine Hcl 40 Mg Tabs (Paroxetine Hcl) .Marland Kitchen.. 1 By Mouth Once Daily 2)  Remicade 100 Mg Solr (Infliximab) .... 5 Mg/kg Every 6 Weeks 3)  Ibuprofen 200 Mg Tabs (Ibuprofen) .... As Needed 4)  Clonazepam 2 Mg Tabs (Clonazepam) .... Take 1/2-1 Tablet By Mouth At Bedtime For Sleep 5)  Azathioprine 50 Mg Tabs (Azathioprine) .... 4 By Mouth Qd  Allergies (verified): No Known Drug Allergies  Past History:  Past Medical History: Reviewed history from 01/25/2009 and no changes required. OBSTRUCTIVE SLEEP APNEA (ICD-327.23) DEPRESSION (ICD-311) ANXIETY (ICD-300.00) ESSENTIAL HYPERTENSION, BENIGN (ICD-401.1) OBESITY, UNSPECIFIED (ICD-278.00) CROHN'S DISEASE SMALL INTESTINE/COLON,  FISTULIZING, PERIANAL NEPHROLITHIASIS VIT D DFEFICIENCY  Past Surgical History: Reviewed history from 09/02/2007 and no changes required. hypospadia surgery (childhood) ILEOCECECTOMY FOR CROHN'S STRICTURE/ABSCESS, 08/05/07  Family History: Reviewed history from 07/31/2009 and no changes required. Family History of Heart Disease: Father No FH of Colon Cancer:  Social History: Reviewed history from 01/25/2009 and no changes required. Occupation:  [place] Patient has never smoked.  Alcohol Use - no Daily Caffeine Use-5 drinks per day Illicit Drug Use - no Patient does not get regular exercise.  Married, children  Vital Signs:  Patient profile:   35 year old male Height:      68 inches Weight:      232.6 pounds BMI:     35.49 Pulse rate:   84 / minute Pulse rhythm:   regular BP sitting:   152 / 84  (left arm) Cuff size:   regular  Vitals Entered By: Bernita Buffy CMA Deborra Medina) (February 18, 2010 4:04 PM)  Physical Exam  General:  Well developed, well nourished, no acute distress.obese.   Abdomen:  Soft, nontender and nondistended. No masses, hepatosplenomegaly or hernias noted. Normal bowel sounds. low midline surgical scar   Impression & Recommendations:  Problem # 1:  CROHN'S DISEASE, LARGE AND SMALL INTESTINES (ICD-555.2) Diagnosed 2005, has been on azathioprine and Remicade on and off On Remicade monotherapy now since 2009 right hemicolectomy (perforation) and recuuernt perianal fistula (controlled0 Azathioprine restarted 11/2009.  Seems ok at this time except for the AM nausea and vomiting.  Problem # 2:  LONG-TERM USE OF MEDICATIONS: AZATHIOPRINE, REMICADE (ICD-V58.69) Assessment: Unchanged mild AST elevation, will observe. he will need a reminder to get flu vaccine this  Fall and will also work on other vaccinations aas we move forward.  Problem # 3:  NAUSEA AND VOMITING (ICD-787.01) Assessment: New AM only, no pain I am suspecting it is anxiety-related  due to stress of divorce. Advised we monitor. He try better sleep hygiene and consider Benadryl ghs Also could be GERD so stop nighttime snacks.  Patient Instructions: 1)  Please schedule a follow-up appointment in 6 months. Sooner as needed 2)  If the nausea problem does not resolve after a few weeks or worsens let Dr. Carlean Purl know. 3)  Avoid late night snacks also, the nausea could be frm acid reflx. 4)  The medication list was reviewed and reconciled.  All changed / newly prescribed medications were explained.  A complete medication list was provided to the patient / caregiver.

## 2010-07-22 NOTE — Progress Notes (Signed)
Summary: results call  Phone Note Outgoing Call   Summary of Call: let him know HIV is neg LFT's with minimal abnormality that I do not think is a problem but will monitor recheck LFT's in 1 month Gatha Mayer MD, [facility]  January 01, 2010 6:36 AM   Follow-up for Phone Call        patient aware , I have sent myself a reminder flag for August 15 labs. Barb Merino RN, [facility]  January 01, 2010 9:25 AM Follow-up by: Barb Merino RN, [place],  January 01, 2010 9:25 AM

## 2010-07-22 NOTE — Op Note (Signed)
Summary: [facility]    [facility]    Imported By: Rise Patience 11/15/2009 13:43:32  _____________________________________________________________________  External Attachment:    Type:   Image     Comment:   External Document

## 2010-07-22 NOTE — Op Note (Signed)
Summary: [facility]  [facility]   Imported By: Phillis Knack 06/25/2009 10:20:37  _____________________________________________________________________  External Attachment:    Type:   Image     Comment:   External Document

## 2010-07-22 NOTE — Progress Notes (Signed)
Summary: Clonazepam  Phone Note Refill Request Message from:  Fax from Pharmacy  Refills Requested: Medication #1:  CLONAZEPAM 2 MG TABS take 1/2-1 tablet by mouth at bedtime for sleep   Dosage confirmed as above?Dosage Confirmed   Supply Requested: 1 month   Last Refilled: 02/21/2010  Method Requested: Fax to Local Pharmacy Next Appointment Scheduled: due in Feb 2012, labs in Nov 2011 Initial call taken by: Abelino Derrick CMA Deborra Medina),  March 17, 2010 9:03 AM Caller: [facility] DrMarland Kitchen  Follow-up for Phone Call        Pt is not out of meds, he called in all of his refills at one time.  Is it OK to refill?  How many refills? Follow-up by: Abelino Derrick CMA Deborra Medina),  March 17, 2010 9:06 AM  Additional Follow-up for Phone Call Additional follow up Details #1::        3 refills total Additional Follow-up by: Gatha Mayer MD, Marval Regal,  March 17, 2010 3:12 PM    Additional Follow-up for Phone Call Additional follow up Details #2::    refills sent.  Pt aware meds will be at pharmacy unless he hears from me otherwise. Follow-up by: Abelino Derrick CMA Deborra Medina),  March 17, 2010 3:30 PM  Prescriptions: CLONAZEPAM 2 MG TABS (CLONAZEPAM) take 1/2-1 tablet by mouth at bedtime for sleep  #30 x 2   Entered by:   Abelino Derrick CMA ([place])   Authorized by:   Gatha Mayer MD, [facility]   Signed by:   Abelino Derrick CMA ([place]) on 03/17/2010   Method used:   Printed then faxed to ...       [facility] DrMarland Kitchen (retail)       [address]       [place], [place]  [address]       Ph: [phone]       Fax: [phone]   [place]:   [phone]

## 2010-07-22 NOTE — Progress Notes (Signed)
Summary: Reschedule remicade  Phone Note Call from Patient Call back at Home Phone [phone]   Caller: Patient Summary of Call: Patient  called to say he has returned to work today and feels much better.  Wants to get him remicade rescheduled.  Please advise Initial call taken by: Barb Merino RN, [place],  August 07, 2009 3:35 PM  Follow-up for Phone Call        ok to receive Remicade infusoion at prior dose and resume prior interval Follow-up by: Gatha Mayer MD, Marval Regal,  August 08, 2009 9:06 AM  Additional Follow-up for Phone Call Additional follow up Details #1::        Patient  rescheduled for 08-14-09 at 11:00.  new orders sent, [name] [name] is aware. Additional Follow-up by: Barb Merino RN, [place],  August 08, 2009 9:58 AM

## 2010-07-22 NOTE — Assessment & Plan Note (Signed)
Summary: follow up crohn's/remicade/sheri   History of Present Illness Visit Type: Follow-up Visit Primary GI MD: Silvano Rusk MD [facility] Primary Provider: Luanne Bras, MD Chief Complaint: Crohn's/ remicade reschedule History of Present Illness:   35 yo white man with fistulizing and cicatrizing Crohn's ileo-colitis on Remicae therapy and s/p ileocecectomy 2/09.  Recent fever started 3-4 days ago, skipped work x 2 days. Son ill also, he was dxed with influenza. Now congested, last fever yesterday. ? Aches, Tmax 102.3.  Skipped remicade appointment this week dueto active infection  He is having chronic loose stools with some urgency. Has never tried Imodium. He tends to defecate multiple times.   Not really a nocturnal issue. No bleeding, anal discharge or drainage.    GI Review of Systems      Denies abdominal pain, acid reflux, belching, bloating, chest pain, dysphagia with liquids, dysphagia with solids, heartburn, loss of appetite, nausea, vomiting, vomiting blood, weight loss, and  weight gain.        Denies anal fissure, black tarry stools, change in bowel habit, constipation, diarrhea, diverticulosis, fecal incontinence, heme positive stool, hemorrhoids, irritable bowel syndrome, jaundice, light color stool, rectal bleeding, and  rectal pain.    Current Medications (verified): 1)  Paxil 30 Mg  Tabs (Paroxetine Hcl) .... Once Daily 2)  Remicade 100 Mg Solr (Infliximab) .... 5 Mg/kg Every 6 Weeks 3)  Ibuprofen 200 Mg Tabs (Ibuprofen) .... As Needed  Allergies (verified): No Known Drug Allergies  Past History:  Past Medical History: Reviewed history from 01/25/2009 and no changes required. OBSTRUCTIVE SLEEP APNEA (ICD-327.23) DEPRESSION (ICD-311) ANXIETY (ICD-300.00) ESSENTIAL HYPERTENSION, BENIGN (ICD-401.1) OBESITY, UNSPECIFIED (ICD-278.00) CROHN'S DISEASE SMALL INTESTINE/COLON, FISTULIZING, PERIANAL NEPHROLITHIASIS VIT D DFEFICIENCY  Past Surgical  History: Reviewed history from 09/02/2007 and no changes required. hypospadia surgery (childhood) ILEOCECECTOMY FOR CROHN'S STRICTURE/ABSCESS, 08/05/07  Family History: Family History of Heart Disease: Father No FH of Colon Cancer:  Social History: Reviewed history from 01/25/2009 and no changes required. Occupation:  [place] Patient has never smoked.  Alcohol Use - no Daily Caffeine Use-5 drinks per day Illicit Drug Use - no Patient does not get regular exercise.  Married, children  Review of Systems       as per HPI  Vital Signs:  Patient profile:   35 year old male Height:      68 inches Weight:      231.13 pounds BMI:     35.27 Pulse rate:   64 / minute Pulse rhythm:   regular BP sitting:   124 / 82  (left arm) Cuff size:   regular  Vitals Entered By: June McMurray [place] Deborra Medina) (July 31, 2009 3:45 PM)  Physical Exam  General:  Well developed, well nourished, no acute distress.obese.   Eyes:  anicteric Mouth:  No deformity or lesions, dentition normal. pharnx clear Lungs:  Clear throughout to auscultation. Heart:  Regular rate and rhythm; no murmurs, rubs,  or bruits. Neurologic:  Alert and  oriented x4;     Impression & Recommendations:  Problem # 1:  CROHN'S DISEASE, LARGE AND SMALL INTESTINES (ICD-555.2) Assessment Unchanged He is having diarrhea which is somewhat problematic for him. This has been an issue for him that I think he may have accepted. I suspect more likely a post-resection issue with loss of IC valve and possibly bile salt malabsorption (? if enough intestine removed). Will start by trying Imodium 1-2 AM and consider bile salt binder pending follow-up. Need to consider adding back azathioprine  since evidence out since last seen indicates better response with immunomodulator and remicade in Crohn's. Remicade monotherapy had been used due to prior evidence and expert recommendations re: monotherapy in younger patients safer. Continue  Remicade infusions once confident viral infection cleared. Will review and recommend changes.  Problem # 2:  VIRAL INFECTION (ICD-079.99) Assessment: New E[place]ing from influenza it seems. He will call back re sxs and will then advise re: having Remicade infusion  Problem # 3:  LONG-TERM USE OF MEDICATIONS: AZATHIOPRINE, REMICADE (ICD-V58.69) not on AZA at this time but will consider reatrting pending review. PPD negative 1/11  Patient Instructions: 1)  Please call with a symptom follow up on Monday.   2)  Do not take Remicade until instructed to do so by Dr. Carlean Purl. 3)  Please schedule a follow-up appointment in 3-6 months.  This will be determined by Dr. Carlean Purl next week. 4)  Begin taking Imodium as directed on medication list below. 5)  Copy sent to : Luanne Bras, MD 6)  The medication list was reviewed and reconciled.  All changed / newly prescribed medications were explained.  A complete medication list was provided to the patient / caregiver.  Appended Document: follow up crohn's/remicade/sheri please have him schedule a follow-up in June/July to check up and discuss possibly adding Imuran back to regimen  Appended Document: follow up crohn's/remicade/sheri Done appointment scheduled for 11/29/2009 at 9:45

## 2010-07-22 NOTE — Miscellaneous (Signed)
Summary: New Remicade Order  F[facility]ed [facility] from [facility] for new [facility]ade order.  Clinical Lists Changes  Orders: Added new Test order of Remicade Infusion (Remicade) - Signed

## 2010-07-22 NOTE — Progress Notes (Signed)
Summary: Flu Vaccine  ---- Converted from flag ---- ---- 02/18/2010 4:44 PM, Gatha Mayer MD, [facility] wrote: He will need a flu vaccine so call him and remind him to get one. Phone note to document please ------------------------------  Phone Note Outgoing Call   Summary of Call: LM to [facility] on work voicemail. Initial call taken by: Abelino Derrick CMA Deborra Medina),  March 24, 2010 3:47 PM  Follow-up for Phone Call        [facility] from pt.  He is scheduled to receive his flu shot on 10/12 at work. Follow-up by: Abelino Derrick CMA Deborra Medina),  March 25, 2010 8:43 AM

## 2010-07-22 NOTE — Progress Notes (Signed)
Summary: results  Phone Note Call from Patient Call back at Home Phone [phone]   Caller: Patient Call For: [name] [name] Reason for Call: Talk to Nurse, Lab or Test Results Summary of Call: Patient would like blood work results Initial call taken by: Ronalee Red,  December 27, 2009 3:33 PM  Follow-up for Phone Call        I have left him a message that "his requested lab was negative", I did not ID HIV results and that Dr [name] [name] will review LFTs on Monday when he returns. Follow-up by: Barb Merino RN, [place],  December 27, 2009 3:45 PM

## 2010-07-22 NOTE — Progress Notes (Signed)
Summary: Schedule PPD skin test  Phone Note Outgoing Call Call back at [facility] Phone [phone]   Call placed by: Barb Merino RN, CGRN,  June 24, 2009 10:39 AM Call placed to: Patient Summary of Call: Left message for patient to call back to schedule PPD skin test  Follow-up for Phone Call        PPD skin test scheduled for 06-26-09, REV scheduled for 07-30-09 1:30. Follow-up by: Barb Merino RN, CGRN,  June 24, 2009 10:52 AM

## 2010-07-22 NOTE — Op Note (Signed)
Summary: [facility]ade Infusion  [facility]   Imported By: Bubba Hales 10/15/2009 08:19:09  _____________________________________________________________________  External Attachment:    Type:   Image     Comment:   External Document

## 2010-07-22 NOTE — Progress Notes (Signed)
Summary: [place]  [place]   Imported By: Phillis Knack 10/14/2009 12:13:58  _____________________________________________________________________  External Attachment:    Type:   Image     Comment:   External Document

## 2010-07-22 NOTE — Op Note (Signed)
Summary: Remicade/[place]  Remicade/[place]   Imported By: Phillis Knack 03/26/2010 14:31:27  _____________________________________________________________________  External Attachment:    Type:   Image     Comment:   External Document

## 2010-07-22 NOTE — Progress Notes (Signed)
  Phone Note Call from Patient   Caller: Patient Summary of Call: has had influenza symptoms most of the week, started vomitting today.  Non-bloody, several times.  No abd pain, no bloating, no distension.  +flatus, +usual loose stools (didn't start immodium yet).   Doesn't sound obstructive (chrons related), he agrees it seems different than his SBO that he had previously.    Possibly influenza related, perhaps norovirus (going around community).    Will call in oral and rectal phenergan that he can try.  HE will call if symptoms begin to sound more obstructive. Initial call taken by: Milus Banister MD,  August 04, 2009 5:04 PM    New/Updated Medications: PROMETHAZINE HCL 12.5 MG  TABS (PROMETHAZINE HCL) take one to two pills, by mouth, every 5-6 hours as needed for nausea PROMETHAZINE HCL 25 MG  SUPP (PROMETHAZINE HCL) insert one suppository rectally every 5-6 hours as needed for nausea Prescriptions: PROMETHAZINE HCL 25 MG  SUPP (PROMETHAZINE HCL) insert one suppository rectally every 5-6 hours as needed for nausea  #25 x 0   Entered and Authorized by:   Milus Banister MD   Signed by:   Milus Banister MD on 08/04/2009   Method used:   Electronically to        [facility]. [address] [phone]* (retail)       [address], [place]  [address]       Ph: [phone] or [phone]       Fax: [phone]   RxID:   [id number] PROMETHAZINE HCL 12.5 MG  TABS (PROMETHAZINE HCL) take one to two pills, by mouth, every 5-6 hours as needed for nausea  #25 x 0   Entered and Authorized by:   Milus Banister MD   Signed by:   Milus Banister MD on 08/04/2009   Method used:   Electronically to        [place]. [address] [phone]* (retail)       [address]       [place], [place]  [address]       Ph: [phone] or [phone]       Fax: [phone]   RxID:   [id number]   Appended Document:  will call him back to reassess  today  Appended Document:  see phone note from 08-05-09

## 2010-07-24 NOTE — Op Note (Signed)
Summary: [facility]    [facility]    Imported By: Rise Patience 07/04/2010 14:21:09  _____________________________________________________________________  External Attachment:    Type:   Image     Comment:   External Document

## 2010-07-24 NOTE — Progress Notes (Signed)
Summary: refill clonazepam  ---- Converted from flag ---- ---- 07/04/2010 2:29 PM, Hope Pigeon CMA wrote: I received via fax a request for [name] [name] to refill his Clonazepam 42m. Last dispensed on 05-21-2010 for 30 tablets....Marland Kitchenn 03/17/10, you wrote that patient could have "3 refills total." Can he have any further refills or does he need to see you for additional refills? ------------------------------       Additional Follow-up for Phone Call Additional follow up Details #2::    ok to refill x 2 He needs follow-up me in Feb or March  Follow-up by: CGatha MayerMD, FMarval Regal  July 06, 2010 6:36 PM  Prescriptions: CLONAZEPAM 2 MG TABS (CLONAZEPAM) take 1/2-1 tablet by mouth at bedtime for sleep  #30 x 1   Entered by:   KHope PigeonCMA   Authorized by:   CGatha MayerMD, [facility]  Signed by:   KHope PigeonCMA on 07/07/2010   Method used:   Printed then faxed to ...       [facility]       [address]       [place]  [address]      Ph: [phone]      Fax: [phone]  [place]   [phone] Called patient home phone number and it was disconnected. Called patient at work and left vm on his machine at work for him to call the office back.  I have not faxed RX yet. Will send RX when I hear from patient.   07-08-2010---[facility]

## 2010-07-24 NOTE — Progress Notes (Signed)
Summary: Medicine Refill   ---- Converted from flag ---- ---- 07/08/2010 8:13 AM, Ronalee Red wrote: Patient returning your call please call him at [phone] ------------------------------  Phone Note Call from Patient   Summary of Call: I called patient back and left message on vm for him to return call to office.. Patient just needs an return office visit appointment

## 2010-07-24 NOTE — Progress Notes (Signed)
Summary: Schedule TB skin test  Phone Note Outgoing Call Call back at [facility] Phone [phone]   Call placed by: Barb Merino RN, CGRN,  June 24, 2010 10:55 AM Call placed to: Patient Summary of Call: Left message for patient to call back to discuss scheduling a TB skin test for remicade Initial call taken by: Barb Merino RN, CGRN,  June 24, 2010 10:55 AM  Follow-up for Phone Call        Left message for patient to call back Barb Merino RN, CGRN  June 25, 2010 9:37 AM  patient is scheduled for TB skin test for [facility] 06/30/10 Follow-up by: Barb Merino RN, CGRN,  June 25, 2010 9:46 AM

## 2010-07-24 NOTE — Assessment & Plan Note (Signed)
Summary: TB skin test for remicade/sheri  Nurse Visit   Allergies: No Known Drug Allergies  Immunizations Administered:  PPD Skin Test:    Vaccine Type: PPD    Site: right forearm    Mfr: Sanofi Pasteur    Dose: 0.1 ml    Route: ID    Given by: Bernita Buffy CMA ([place])    Exp. Date: 04/24/2011    Lot #: [id number]  PPD Results    Date of reading: 06/27/2010    Results: < 74m    Interpretation: negative  Orders Added: 1)  TB Skin Test [86580] 2)  Admin 1st Vaccine [[19802]

## 2010-07-30 ENCOUNTER — Encounter: Payer: Self-pay | Admitting: Internal Medicine

## 2010-07-30 ENCOUNTER — Encounter (HOSPITAL_COMMUNITY): Payer: 59 | Attending: Internal Medicine

## 2010-07-30 DIAGNOSIS — Z79899 Other long term (current) drug therapy: Secondary | ICD-10-CM | POA: Insufficient documentation

## 2010-07-30 DIAGNOSIS — K5090 Crohn's disease, unspecified, without complications: Secondary | ICD-10-CM | POA: Insufficient documentation

## 2010-08-01 ENCOUNTER — Ambulatory Visit: Payer: Self-pay | Admitting: Internal Medicine

## 2010-08-12 ENCOUNTER — Encounter: Payer: Self-pay | Admitting: Internal Medicine

## 2010-08-12 ENCOUNTER — Inpatient Hospital Stay (INDEPENDENT_AMBULATORY_CARE_PROVIDER_SITE_OTHER)
Admission: RE | Admit: 2010-08-12 | Discharge: 2010-08-12 | Disposition: A | Discharge status: Home / Self Care | Payer: 59 | Source: Ambulatory Visit

## 2010-08-12 ENCOUNTER — Telehealth: Payer: Self-pay | Admitting: Internal Medicine

## 2010-08-12 DIAGNOSIS — B029 Zoster without complications: Secondary | ICD-10-CM

## 2010-08-13 NOTE — Op Note (Signed)
Summary: Remicade/[place]  Remicade/[place]   Imported By: Phillis Knack 08/08/2010 14:23:03  _____________________________________________________________________  External Attachment:    Type:   Image     Comment:   External Document

## 2010-08-14 ENCOUNTER — Telehealth: Payer: Self-pay | Admitting: Internal Medicine

## 2010-08-18 ENCOUNTER — Other Ambulatory Visit: Payer: Self-pay

## 2010-08-19 NOTE — Progress Notes (Signed)
Summary: Triage  Phone Note Call from Patient Call back at Home Phone [phone]   Caller: Patient Call For: Dr. Carlean Purl Reason for Call: Talk to Nurse Summary of Call: Has shingles and having alot of pain....wants to discuss Initial call taken by: Webb Laws,  August 14, 2010 11:07 AM  Follow-up for Phone Call        Left message for patient to call back [place], [facility]  August 14, 2010 1:54 PM  he left me a return voicemail with the following complaints.  Patient is c/o itching and burning from shingles rash, having a lot of pain.  He is using zovirax cream and this seems to have aggravated this.  He is wondering if this is normal?  Using ibuprofen for the pain.  Dr Carlean Purl please advise Follow-up by: Barb Merino RN, [place],  August 14, 2010 2:21 PM  Additional Follow-up for Phone Call Additional follow up Details #1::        I am not sure - out of my realm of experience he is going to need to schedule with PCP for follow-up on this one I think I have gone ahead with hydrocodone-APAP for pain for now see rx I gave Additional Follow-up by: Gatha Mayer MD, Marval Regal,  August 14, 2010 4:29 PM    Additional Follow-up for Phone Call Additional follow up Details #2::    Patient advised to come pick up rx and Dr Celesta Aver recommendations. Follow-up by: Barb Merino RN, [place],  August 14, 2010 5:09 PM  New/Updated Medications: HYDROCODONE-ACETAMINOPHEN 5-500 MG TABS (HYDROCODONE-ACETAMINOPHEN) 1 by mouth every4-6 hours as needed for pain Prescriptions: HYDROCODONE-ACETAMINOPHEN 5-500 MG TABS (HYDROCODONE-ACETAMINOPHEN) 1 by mouth every4-6 hours as needed for pain  #20 x 0   Entered and Authorized by:   Gatha Mayer MD, [facility]   Signed by:   Gatha Mayer MD, [facility] on 08/14/2010   Method used:   Printed then faxed to ...       [facility] DrMarland Kitchen (retail)       [address]       [place], [place]  [address]       Ph: [phone]   Fax: [phone]   [place]:   [phone]

## 2010-08-19 NOTE — Progress Notes (Signed)
Summary: Remicade ?s  Phone Note Call from Patient Call back at Home Phone [phone]   Caller: Patient Call For: Dr. Carlean Purl Reason for Call: Talk to Nurse Summary of Call: PT wants to speak with a nurse about Remicade. Initial call taken by: Martinique Johnson,  August 12, 2010 8:24 AM  Follow-up for Phone Call         Is in pain - is pretty sure its shingles. Should he go to ER or wait & see if we can squeezed him in by leaving this message. Irwin Brakeman [facility]  August 12, 2010 9:52 AM  Additional Follow-up for Phone Call Additional follow up Details #1::        Patient thinks he has Shingles.  He reports a small raised blister type rash on his side and is very painful.  He can't get in with his primary care today, so he is going to go to Urgent care.  He is asked to call me back with the diagnosis.  He had his last Remicade on 07/30/10 Additional Follow-up by: Barb Merino RN, CGRN,  August 12, 2010 10:00 AM    Additional Follow-up for Phone Call Additional follow up Details #2::    ok - lets get the documentation from the visit to urgent care  Follow-up by: Gatha Mayer MD, Marval Regal,  August 12, 2010 10:40 AM

## 2010-08-19 NOTE — Letter (Addendum)
Summary: Urgent Care Visit- Shingles  [place], [place] - MRN: [id number] Acct#: [id number] PHYSICIAN DOCUMENTATION SHEET Tue Feb 21 19:21:15 EST 2012 [name] [name]. [facility] [address] [place], [place] [address] PHONE: [phone] MRN: [id number] Account #: [id number] Name: [name] [name], [name] [name] Sex: M Age: 35 DOB: 1975-12-27 Complaint: RASH Primary Diagnosis: Shingles Arrival Time: 08/12/2010 10:53 Discharge Time: 08/12/2010 12:02 All Providers: Dr. Lujean Rave - DO PROVIDER: Dr. Lujean Rave - DO HPI: The patient is a 35 year old male who presents with a chief complaint of rASH. The history was provided by the patient. ONSET SAT WITH RASH LEFT UPPER LAT CHEST. PAINFUL BLISTERS. HX OF SHINGLES IN THE PAST. NO TX PTA. 11:48 08/12/2010 by Lujean Rave - DO, Dr. Leonard Downing: Statement: all systems negative except as marked or noted in the HPI 11:48 08/12/2010 by Lujean Rave - DO, Dr. [facility]: Documentation: physician reviewed/amended Historian: patient Patient's Current Physicians Patient's Current Physicians (please list PCP first) [facility], - Family Practice Past medical history: hypertension, Crohn's disease, depression Family History: CAD, hypertension Surgical History: none Social History: non-smoker, no drug abuse, occasional drinker Allergies Drug Reaction Allergy Note NKDA 11:43 08/12/2010 by Lujean Rave - DO, Dr. Home Medications: Documentation: physician reviewed/amended 1 [name] [name], [name] [name] - MRN: [id number] Acct#: [id number] Medications Medication [Medication] Dosage Frequency Last Dose Imuran Oral Remicade IV Paxil Oral 11:43 08/12/2010 by Lujean Rave - DO, Dr. Physical examination: Vital signs and O2 SAT: reviewed, normal Constitutional: well developed, well nourished, well hydrated, in no acute distress Cardiovascular: regular rate and rhythm Respiratory: breath sounds clear & equal bilaterally Skin: NOTE - 3 SCABBED ULCERS WITH ERYTHEMATOUS BASE LEFT  UPPER OUTER CHEST. 11:49 08/12/2010 by Lujean Rave - DO, Dr. Patient disposition: Patient disposition: Disch - Home Primary Diagnosis: shingles Counseling: advised of diagnosis, advised of treatment plan 11:43 08/12/2010 by Lujean Rave - DO, Dr. MDM: Comments: PATIENT JUST OUTSIDE [address] [place]. DISCUSSED THIS WITH PATIENT AND HE ELECTS TO USE TOPICAL [place]. 11:44 08/12/2010 by Lujean Rave - DO, Dr. Prescriptions: Prescription Medication Dispense Sig Line acyclovir 5 % Topical Cream 15 GMS APPLY Q4 HRS 5 TIMES DAILY 11:45 08/12/2010 by Lujean Rave - DO, Dr. Medication disposition: Medications Medication [Medication] Dosage Frequency Last Dose Medication disposition PCP contact Imuran Oral continue Remicade IV continue Paxil Oral continue 11:45 08/12/2010 by Lujean Rave - DO, Dr. Kingsley Callander [name], [name] [name] - MRN: [id number] Acct#: [id number] Discharge: Discharge Instructions: shingles Append a Note to Discharge Instructions: [facility] . [place] PCP. Referral/Appointment Refer Patient To: Phone Number: Follow-up in Appointment Details: *No PCP, Per pt./family/records Drug Instructions: antibiotic antiviral oral zovirax 11:46 08/12/2010 by Lujean Rave - DO, Dr. Return to Work/School: Sheet is for: [name] [name], [name] [name] Was in the ED from: 08/12/2010 10:53 Until: 08/12/2010 11:46 Return Disposition: May return to work without restrictions Return Date: 08/13/2010 11:46 08/12/2010 by Lujean Rave - DO, Dr. Hyman Bible electronically signed by Responsible Physician 19:21 08/12/2010 by Lujean Rave - DO, Dr. Garald Braver

## 2010-09-02 ENCOUNTER — Ambulatory Visit (INDEPENDENT_AMBULATORY_CARE_PROVIDER_SITE_OTHER): Payer: 59 | Admitting: Internal Medicine

## 2010-09-02 ENCOUNTER — Other Ambulatory Visit: Payer: Self-pay | Admitting: Internal Medicine

## 2010-09-02 ENCOUNTER — Other Ambulatory Visit: Payer: 59

## 2010-09-02 ENCOUNTER — Encounter: Payer: Self-pay | Admitting: Internal Medicine

## 2010-09-02 DIAGNOSIS — B029 Zoster without complications: Secondary | ICD-10-CM | POA: Insufficient documentation

## 2010-09-02 DIAGNOSIS — Z79899 Other long term (current) drug therapy: Secondary | ICD-10-CM

## 2010-09-02 DIAGNOSIS — K5080 Crohn's disease of both small and large intestine without complications: Secondary | ICD-10-CM

## 2010-09-02 LAB — CBC WITH DIFFERENTIAL/PLATELET
Basophils Relative: 0.6 % (ref 0.0–3.0)
Eosinophils Relative: 1 % (ref 0.0–5.0)
Lymphocytes Relative: 26.4 % (ref 12.0–46.0)
Neutrophils Relative %: 61.5 % (ref 43.0–77.0)
RBC: 4.73 Mil/uL (ref 4.22–5.81)
WBC: 6.2 10*3/uL (ref 4.5–10.5)

## 2010-09-02 LAB — COMPREHENSIVE METABOLIC PANEL
Albumin: 4.7 g/dL (ref 3.5–5.2)
BUN: 14 mg/dL (ref 6–23)
CO2: 29 mEq/L (ref 19–32)
Calcium: 9.5 mg/dL (ref 8.4–10.5)
Chloride: 101 mEq/L (ref 96–112)
Glucose, Bld: 85 mg/dL (ref 70–99)
Potassium: 4.5 mEq/L (ref 3.5–5.1)

## 2010-09-02 NOTE — Discharge Summary (Signed)
Summary: Crohns Flare   NAME:  [name] [name], [name] [name]               ACCOUNT NO.:  [id number]      MEDICAL RECORD NO.:  [id number]          PATIENT TYPE:  INP      LOCATION:  5156                         FACILITY:  [facility]      PHYSICIAN:  Irine Seal, MD    DATE OF BIRTH:  Apr 02, 1976      DATE OF ADMISSION:  08/04/2007   DATE OF DISCHARGE:  08/13/2007                                  DISCHARGE SUMMARY      PRIMARY CARE PHYSICIAN/NURSE PRACTITIONER:  Luanne Bras of [facility]   [facility] at [facility].      GASTROENTEROLOGIST:  Gatha Mayer, MD of [facility].      SURGEON:  Adin Hector, MD of [facility].      DISCHARGE DIAGNOSIS:   1. Crohn's flare with severe colonic stricture and contained micro       perforation status post exploratory laparotomy and ileocecectomy       with primary anastomosis performed on August 05, 2007 per Dr.       Johney Maine.   2. Postoperative ileus.   3. Escherichia coli urinary tract infection.   4. Obstructive sleep apnea.   5. Hypertension.   6. Depression.   7. Anxiety.   8. Hepatic steatosis      DISCHARGE MEDICATIONS:   1. Bactrim DS 1 tablet p.o. b.i.d. x1 week.   2. Percocet 5/325 one to two tablets p.o. q.4 h. p.r.n. pain.   3. Prednisone 10 mg daily x1 week, 5 mg daily x1 week, then 5 mg every       other day x1 week.   4. Lisinopril 10 mg p.o. daily.   5. Paxil 40 mg p.o. daily.   6. Xanax 0.25 mg p.r.n.      DISPOSITION AND FOLLOWUP:  The patient will be discharged home to follow   up with Dr. Johney Maine of [facility] surgery in 2 weeks.  The patient   is also to follow up with Dr. Carlean Purl of [facility] gastroenterology on   August 23, 2007 at 3:00 p.m. The patient is also to follow up with   PCP/nurse practitioner Luanne Bras in 2-3 weeks.  A repeat urinalysis   needs to be checked for resolution of UTI.  The patient's blood pressure   will also need to be reassessed then.      CONSULTATIONS:   1. A  gastroenterology consult was done, the patient was seen by       [facility] gastroenterology and Dr. Henrene Pastor on August 05, 2007.   2. The patient was also seen by Dr. Carlean Purl on August 11, 2007.   3. [facility] was consulted, and the patient was seen by       Dr. Brantley Stage, on August 05, 2007.      PROCEDURES PERFORMED:   1. Exploratory laparotomy was performed on August 05, 2007 with       ileocecectomy with primary anastomosis   2. A CT of the abdomen and pelvis was performed on August 05, 2007  which showed chronic, but severe stricture of the ascending colon       with active inflammation surrounding this level, bowel wall       thickening involving the cecum, and marked dilatation of the distal       terminal ileum.  The appearance, most compatible, with acute-on-       chronic inflammatory bowel disease and obstruction appearance       suggesting contained colonic perforation, and a developing       fistulous tract from the ascending colon, other chronic small bowel       inflammatory changes, and chronic diffuse colonic wall thickening       which may represent additional inflammatory involvement,       progression of hepatic steatosis, stable pelvis with suspected       chronic inflammatory wall thickening of the distal colon.      BRIEF ADMISSION HISTORY AND PHYSICAL:  [name] is a 35-year-   old white male with past medical history of obesity and Crohn's disease,   who had been recently started back on Remicade to treat his Crohn's for   which he has continued to have problems with.  He started the late   afternoon hours of August 04, 2007 having abdominal pain, mostly in   the right upper quadrant, but with some radiation to the rest of the   abdomen.  When the patient could not take it anymore, he came to the   emergency room.  He was noted to be slightly tachycardiac.  Labs were   drawn, and the patient was found to have a white count of  16.6 and had a   97% shift.  Otherwise he was notable for an incidental urinary tract   infection.  CT scan of the abdomen and pelvis noted acute-on-chronic   inflammatory findings, severe stricture of the ascending colon,   subsequent obstruction of the cecum and terminal ileum, and suspicious   containing bowel colon perforation.  They also noted some addition of   some small-bowel mesentery inflammation compatible with Crohn's, and   also noted some incidental progression of hepatic steatosis.  With these   findings, the ER contacted [facility], who after reviewing   the films and the patient's history, felt actually that surgical consult   was not needed; and that the patient be able to manage with GI   intervention.  They, however, requested that the patient be admitted to   the hospitalist service, and they would consult.      Currently the patient received some medication for pain which he said   briefly helped his abdominal pain.  He says, however that it was coming   back.  He also complained of headache.  He denied any visual changes, no   dysphagia, no chest pain, no palpitations, no shortness of breath,   wheezing, or coughing.  He complained of abdominal pain mostly, again,   in the right area described as sharp and crampy, but diffusing into the   rest of the abdomen.  The patient denied any hematuria, dysuria,   constipation, diarrhea, focal extremity numbness, weakness or pain.   Review of systems was otherwise negative.      PHYSICAL EXAM:  VITAL SIGNS:  Per admitting physician temperature of   99.6, blood pressure 123/76, heart rate 112, respirations 20, saturating   96% on room air.   GENERAL: The patient was alert and oriented x3 in some  moderate distress   secondary to abdominal pain.   HEENT: Normocephalic, atraumatic.  Mucous membranes dry.   NECK:  No carotid bruits.  Neck was supple, no lymphadenopathy.   CARDIOVASCULAR:  Regular rate and rhythm.     RESPIRATORY:  Decreased inspiratory effort secondary to abdominal pain,   but clear to auscultation bilaterally.   ABDOMEN:  Soft with some diffuse tenderness even with minor   auscultation.  Hypoactive bowel sounds.   EXTREMITIES: No clubbing, cyanosis, or edema.      ADMISSION LABS:  Urinalysis showed moderate leukocyte esterase, 11-20   white cells, many bacteria.  Sodium 136, potassium 3.6, chloride 103,   bicarb 27, BUN 13, creatinine 1, glucose 112, CT scan as stated above.      HOSPITAL COURSE:  Problem #1: Crohn's flare with severe colonic   stricture and contained colonic perforation status post exploratory   laparotomy and ileocecectomy with primary anastomosis.  The patient was   admitted, was made n.p.o., was placed on a PPI and IV fluid, and was   given pain medication for pain control and nausea control as well.  The   patient was placed on IV antibiotics of Cipro and Flagyl.  Both   gastroenterologists consulted on the patient, and so did general surgery   consult on the patient.  The patient was taken to the operating room   were exploratory  laparotomy any via cystectomy with primary anastomosis   was performed.  The patient remained stable during the hospitalization.   The patient's diet was advanced slowly.  The patient tolerated the diet.   The patient had a few episodes of nausea, but no vomiting.  The patient   started to have some positive flatus and also was started to have some   bowel movements as well.  The patient remained stable throughout the   hospitalization, and her by day of discharge the patient was in stable   and improved condition.  The patient was also initially placed on IV   Solu-Medrol which was then tapered down to oral prednisone.  The patient   was seen, again, by his gastroenterologist Dr. Carlean Purl who had   recommended that the patient's steroids be tapered off and the patient   followup as an outpatient with him, for possible start of  exanthrope   with the other patient.  The patient remained stable and improved, and   by day of discharge the patient was in stable and improved condition to   follow up with the gastroenterologist and general surgeon.      Problem #2:  Postop ileus.  This occurred postoperatively, but improved   quickly.  The patient was able to tolerate oral intake.  The patient's   diet was advanced.  The patient started passing gas, and started having   bowel movements.  By the day of discharge the patient's postop ileus had   resolved.      Problem #3:  E. coli UTI.  This was noted incidentally on a urinalysis.   The patient initially was on IV Cipro which covered for his a urinary   tract infection.  The antibiotics were then discontinued and changed.   Urine cultures came back, and the patient was placed on Bactrim DS which   the urine culture was sensitive to.  The patient will be discharged home   to complete 1 more week of antibiotics with Bactrim DS to complete a 2-   week course of antibiotic therapy.  These will need to be followed up   per PCP/nurse practitioner for resolution of a urinary tract infection.   The rest of the patient's chronic medical issues were stable throughout   the hospitalization, and on day of discharge the patient was in stable   and improved condition.      Vital signs on day of discharge temperature 97.4, pulse of 62, blood   pressure 139/83, respiratory rate 80, saturating 97% on room air.   Discharge labs sodium 137, potassium 4.3, chloride 102, bicarb 27, BUN   8, creatinine 0.89 and glucose of 116, calcium of 8.8.  CBC white count   9.8, hemoglobin 13.6, platelets 329, and hematocrit 40.9.  The patient   will be discharged in stable and improved condition to home with   followup as stated and scheduled above.      Add to disposition and follow up that the patient's hepatic steatosis   will need to be followed up as an outpatient.  It has been a pleasure    taking care of Mr. Arley Salamone.               Irine Seal, MD   Electronically Signed            DT/MEDQ  D:  08/13/2007  T:  08/14/2007  Job:  [id number]      cc:   Gatha Mayer, MD,FACG   Adin Hector, MD

## 2010-09-08 ENCOUNTER — Encounter: Payer: Self-pay | Admitting: Internal Medicine

## 2010-09-08 ENCOUNTER — Encounter (INDEPENDENT_AMBULATORY_CARE_PROVIDER_SITE_OTHER): Payer: Self-pay | Admitting: *Deleted

## 2010-09-08 ENCOUNTER — Other Ambulatory Visit: Payer: 59

## 2010-09-08 DIAGNOSIS — K5080 Crohn's disease of both small and large intestine without complications: Secondary | ICD-10-CM

## 2010-09-08 LAB — CONVERTED CEMR LAB: Hep B S Ab: NEGATIVE

## 2010-09-09 ENCOUNTER — Encounter: Payer: Self-pay | Admitting: Internal Medicine

## 2010-09-09 ENCOUNTER — Telehealth: Payer: Self-pay | Admitting: *Deleted

## 2010-09-09 ENCOUNTER — Other Ambulatory Visit: Payer: Self-pay | Admitting: Internal Medicine

## 2010-09-09 DIAGNOSIS — K5080 Crohn's disease of both small and large intestine without complications: Secondary | ICD-10-CM

## 2010-09-09 MED ORDER — INFLIXIMAB 100 MG IV SOLR: INTRAVENOUS | Status: DC

## 2010-09-09 NOTE — Telephone Encounter (Signed)
Ok to renew for his Crohn's for 6 months I believe he has had TB test Will need to find and fax to them

## 2010-09-09 NOTE — Progress Notes (Signed)
This encounter was created in error - please disregard.

## 2010-09-09 NOTE — Telephone Encounter (Signed)
[name] [name] from Short Stay at [facility] called to request new orders for Remicade. She also wants documentation on TB test for 2012 if we have it. Fax [phone].  Dr Carlean Purl, old Remicade order was for 54m/kg every 6 weeks. Pre med Benadryl 25-551mpo prior to infusion, Tylenol 62573mo prior to infusion. Ok to renew? Thanks.

## 2010-09-09 NOTE — Assessment & Plan Note (Signed)
Summary: MED REFILLS--CH.    SCH'D W/PT//AETNA//MEDLIST//CX POLICY ADV...   Vital Signs:  Patient profile:   35 year old male Height:      68 inches Weight:      233 pounds BMI:     35.56 Pulse rate:   88 / minute Pulse rhythm:   regular BP sitting:   142 / 82  (right arm) Cuff size:   regular  Vitals Entered By: Marlon Pel CMA Deborra Medina) (September 02, 2010 9:54 AM)  History of Present Illness Visit Type: Follow-up Visit Primary GI MD: Silvano Rusk MD [facility] Primary Provider: Luanne Bras, MD Chief Complaint: f/y Crohn's disease and shingles last month. Pt states the shingles have scabbed over and are not painful now. Pt denies abd pain but has some diarrhea.  History of Present Illness:   He denies any abdominal pain or rectal pain. No drainage from rectum/fistula. Occasional diarrhea. Overall feeling well with respect to Crohn's. He continues to have nocturnal insomnia and would like a refill on clonazepam. Is sleepy during the day frequently. Has sleep apnea and a device (CPAP) but is not using it.  Next Remicade due 09/10/10.  He would like a new PCP referral.          Current Medications (verified): 1)  Paroxetine Hcl 40 Mg Tabs (Paroxetine Hcl) .Marland Kitchen.. 1 By Mouth Once Daily 2)  Remicade 100 Mg Solr (Infliximab) .... 5 Mg/kg Every 6 Weeks 3)  Ibuprofen 200 Mg Tabs (Ibuprofen) .... As Needed 4)  Azathioprine 50 Mg Tabs (Azathioprine) .... 4 By Mouth Qd  Allergies (verified): No Known Drug Allergies  Past History:  Past Medical History: OBSTRUCTIVE SLEEP APNEA (ICD-327.23) DEPRESSION (ICD-311) ANXIETY (ICD-300.00) ESSENTIAL HYPERTENSION, BENIGN (ICD-401.1) OBESITY, UNSPECIFIED (ICD-278.00) CROHN'S DISEASE SMALL INTESTINE/COLON, FISTULIZING, PERIANAL NEPHROLITHIASIS VIT D DFEFICIENCY Shingles  Past Surgical History: Reviewed history from 09/02/2007 and no changes required. hypospadia surgery (childhood) ILEOCECECTOMY FOR CROHN'S STRICTURE/ABSCESS,  08/05/07  Family History: Reviewed history from 07/31/2009 and no changes required. Family History of Heart Disease: Father No FH of Colon Cancer:  Social History: Reviewed history from 01/25/2009 and no changes required. Occupation:  [place] Patient has never smoked.  Alcohol Use - no Daily Caffeine Use-5 drinks per day Illicit Drug Use - no Patient does not get regular exercise.  Married, children  Physical Exam  General:  Well developed, well nourished, no acute distress.obese.   Lungs:  Clear throughout to auscultation. Heart:  Regular rate and rhythm; no murmurs, rubs,  or bruits. Abdomen:  Soft, nontender and nondistended. No masses, hepatosplenomegaly or hernias noted. Normal bowel sounds. low midline surgical scar Skin:  few crusted lesions (red) T5 dermatome on left lateral to nipple arae but not in mid-axillary area  mild erythema and small pustular and papular lesions consistent with folliculitis in waistband area   Impression & Recommendations:  Problem # 1:  CROHN'S DISEASE, LARGE AND SMALL INTESTINES (ICD-555.2) Diagnosed 2005, has been on azathioprine and Remicade on and off On Remicade monotherapy now since 2009 right hemicolectomy (perforation) and recuuernt perianal fistula (controlled) Azathioprine restarted 11/2009. Due to data showing better outcomes on dual tx (biologic and immunomodulator).   doing well. No changes at this time.   Orders: TLB-CBC Platelet - w/Differential (85025-CBCD) TLB-CMP (Comprehensive Metabolic Pnl) (17001-VCBS)  Problem # 2:  LONG-TERM USE OF MEDICATIONS: AZATHIOPRINE, REMICADE (ICD-V58.69) Assessment: Unchanged shingles recently no signs toxicity to date need vaccination evaluaton: will have him get HAV total and Hep B surface Ab. He should also receive  a pneumovax and he should be able to get that in pulmonary clinic.  Problem # 3:  HERPES ZOSTER (ICD-053.9) Assessment: Improved Resolving. i think he should be  ok for next remicade 3/21  Problem # 4:  ANXIETY (ICD-300.00) hold off on clonazepam - he is out using to sleep at night but also not using CPAP  Problem # 5:  OBSTRUCTIVE SLEEP APNEA (ICD-327.23) Assessment: New Have not addressed in past. He is not in compliance and has other sleep issues. Sleep MD consult requested.  Orders: Pulmonary Medicine Referral (Pulmonary)  Other Orders: Internal Medicine/Primary Care Referral [facility])  Patient Instructions: 1)  You have been scheduled for an appointment with Dr Adella Hare (1st floor) on Oct 28, 2010 @ 1:30 pm with a 1:15 arrival. 2)  You have been schedule for an appointment with Dr Chesley Mires at [facility] Pulmonary (2nd floor) on October 07, 2010 @ 2:30 pm with 2:15 pm arrival. 3)  Please make sure to bring all of your medications including vitamins to both of your doctors visits. 4)  Your physician requests that you go to the basement floor of our office to have the following labwork completed before leaving today: CBC, CMET 5)  Please schedule a follow-up appointment in 6 months or sooner as needed 6)  The medication list was reviewed and reconciled.  All changed / newly prescribed medications were explained.  A complete medication list was provided to the patient / caregiver.   Orders Added: 1)  Internal Medicine/Primary Care Referral [IMPrimary] 2)  Pulmonary Medicine Referral [Pulmonary] 3)  TLB-CBC Platelet - w/Differential [85025-CBCD] 4)  TLB-CMP (Comprehensive Metabolic Pnl) [[id number]]

## 2010-09-10 ENCOUNTER — Other Ambulatory Visit: Payer: Self-pay | Admitting: *Deleted

## 2010-09-10 ENCOUNTER — Encounter (HOSPITAL_COMMUNITY): Payer: 59 | Attending: Internal Medicine

## 2010-09-10 DIAGNOSIS — Z79899 Other long term (current) drug therapy: Secondary | ICD-10-CM | POA: Insufficient documentation

## 2010-09-10 DIAGNOSIS — K5090 Crohn's disease, unspecified, without complications: Secondary | ICD-10-CM | POA: Insufficient documentation

## 2010-09-10 NOTE — Telephone Encounter (Signed)
Barb Merino, RN faxed new orders with TB update to Short Stay yesterday.

## 2010-09-10 NOTE — Telephone Encounter (Signed)
Barb Merino, RN faxed a new order yesterday.

## 2010-09-26 LAB — URINALYSIS, ROUTINE W REFLEX MICROSCOPIC
Bilirubin Urine: NEGATIVE
Glucose, UA: NEGATIVE mg/dL
Leukocytes, UA: NEGATIVE
Nitrite: NEGATIVE
Specific Gravity, Urine: 1.024 (ref 1.005–1.030)
pH: 6 (ref 5.0–8.0)

## 2010-09-26 LAB — URINE MICROSCOPIC-ADD ON

## 2010-10-03 ENCOUNTER — Encounter: Payer: Self-pay | Admitting: Pulmonary Disease

## 2010-10-07 ENCOUNTER — Institutional Professional Consult (permissible substitution): Payer: 59 | Admitting: Pulmonary Disease

## 2010-10-10 ENCOUNTER — Emergency Department (HOSPITAL_COMMUNITY): Payer: 59

## 2010-10-10 ENCOUNTER — Emergency Department (HOSPITAL_COMMUNITY)
Admission: EM | Admit: 2010-10-10 | Discharge: 2010-10-11 | Disposition: A | Discharge status: Home / Self Care | Payer: 59 | Attending: Emergency Medicine | Admitting: Emergency Medicine

## 2010-10-10 DIAGNOSIS — R509 Fever, unspecified: Secondary | ICD-10-CM | POA: Insufficient documentation

## 2010-10-10 DIAGNOSIS — N200 Calculus of kidney: Secondary | ICD-10-CM | POA: Insufficient documentation

## 2010-10-10 DIAGNOSIS — N289 Disorder of kidney and ureter, unspecified: Secondary | ICD-10-CM | POA: Insufficient documentation

## 2010-10-10 DIAGNOSIS — R109 Unspecified abdominal pain: Secondary | ICD-10-CM | POA: Insufficient documentation

## 2010-10-11 LAB — COMPREHENSIVE METABOLIC PANEL
ALT: 24 U/L (ref 0–53)
Alkaline Phosphatase: 74 U/L (ref 39–117)
BUN: 12 mg/dL (ref 6–23)
CO2: 27 mEq/L (ref 19–32)
Chloride: 101 mEq/L (ref 96–112)
Glucose, Bld: 119 mg/dL — ABNORMAL HIGH (ref 70–99)
Potassium: 3.4 mEq/L — ABNORMAL LOW (ref 3.5–5.1)
Sodium: 138 mEq/L (ref 135–145)
Total Bilirubin: 0.9 mg/dL (ref 0.3–1.2)

## 2010-10-11 LAB — DIFFERENTIAL
Basophils Relative: 1 % (ref 0–1)
Eosinophils Relative: 0 % (ref 0–5)
Monocytes Absolute: 1.2 10*3/uL — ABNORMAL HIGH (ref 0.1–1.0)
Monocytes Relative: 23 % — ABNORMAL HIGH (ref 3–12)
Neutrophils Relative %: 59 % (ref 43–77)

## 2010-10-11 LAB — CBC
HCT: 45.2 % (ref 39.0–52.0)
Hemoglobin: 15.4 g/dL (ref 13.0–17.0)
MCV: 93 fL (ref 78.0–100.0)
RBC: 4.86 MIL/uL (ref 4.22–5.81)
RDW: 14.7 % (ref 11.5–15.5)
WBC: 5.3 10*3/uL (ref 4.0–10.5)

## 2010-10-11 LAB — URINALYSIS, ROUTINE W REFLEX MICROSCOPIC
Glucose, UA: NEGATIVE mg/dL
Hgb urine dipstick: NEGATIVE
Specific Gravity, Urine: 1.046 — ABNORMAL HIGH (ref 1.005–1.030)
Urobilinogen, UA: 0.2 mg/dL (ref 0.0–1.0)

## 2010-10-11 MED ORDER — IOHEXOL 300 MG/ML  SOLN
125.0000 mL | Freq: Once | INTRAMUSCULAR | Status: AC | PRN
  Administered 2010-10-11: 125 mL via INTRAVENOUS

## 2010-10-13 ENCOUNTER — Encounter: Payer: Self-pay | Admitting: Internal Medicine

## 2010-10-13 ENCOUNTER — Other Ambulatory Visit (INDEPENDENT_AMBULATORY_CARE_PROVIDER_SITE_OTHER): Payer: 59

## 2010-10-13 ENCOUNTER — Ambulatory Visit (INDEPENDENT_AMBULATORY_CARE_PROVIDER_SITE_OTHER): Payer: 59 | Admitting: Internal Medicine

## 2010-10-13 VITALS — BP 130/82 | HR 80 | Temp 99.1°F | Ht 69.0 in | Wt 229.0 lb

## 2010-10-13 DIAGNOSIS — R112 Nausea with vomiting, unspecified: Secondary | ICD-10-CM

## 2010-10-13 DIAGNOSIS — R109 Unspecified abdominal pain: Secondary | ICD-10-CM

## 2010-10-13 DIAGNOSIS — D849 Immunodeficiency, unspecified: Secondary | ICD-10-CM

## 2010-10-13 DIAGNOSIS — D899 Disorder involving the immune mechanism, unspecified: Secondary | ICD-10-CM

## 2010-10-13 DIAGNOSIS — K5080 Crohn's disease of both small and large intestine without complications: Secondary | ICD-10-CM

## 2010-10-13 DIAGNOSIS — Z79899 Other long term (current) drug therapy: Secondary | ICD-10-CM

## 2010-10-13 LAB — COMPREHENSIVE METABOLIC PANEL
Albumin: 4.1 g/dL (ref 3.5–5.2)
Alkaline Phosphatase: 71 U/L (ref 39–117)
BUN: 12 mg/dL (ref 6–23)
CO2: 30 mEq/L (ref 19–32)
Calcium: 8.4 mg/dL (ref 8.4–10.5)
Chloride: 95 mEq/L — ABNORMAL LOW (ref 96–112)
GFR: 93.29 mL/min (ref >60.00)
Glucose, Bld: 96 mg/dL (ref 70–99)
Potassium: 3.5 mEq/L (ref 3.5–5.1)

## 2010-10-13 LAB — CBC WITH DIFFERENTIAL/PLATELET
Basophils Relative: 1.1 % (ref 0.0–3.0)
Eosinophils Relative: 0.3 % (ref 0.0–5.0)
MCV: 95.8 fl (ref 78.0–100.0)
Monocytes Absolute: 0.7 10*3/uL (ref 0.1–1.0)
Neutrophils Relative %: 62.1 % (ref 43.0–77.0)
RBC: 4.61 Mil/uL (ref 4.22–5.81)
WBC: 3.9 10*3/uL — ABNORMAL LOW (ref 4.5–10.5)

## 2010-10-13 LAB — AMYLASE: Amylase: 34 U/L (ref 27–131)

## 2010-10-13 LAB — LIPASE: Lipase: 21 U/L (ref 11.0–59.0)

## 2010-10-13 NOTE — Patient Instructions (Signed)
Please have your labs drawn in the basement later today or first thing tomorrow morning. Keep sipping liquids frequently in small amounts to stay hydrated. Call us back if things worsen before we contact you tomorrow.

## 2010-10-14 ENCOUNTER — Encounter: Payer: Self-pay | Admitting: Internal Medicine

## 2010-10-14 ENCOUNTER — Telehealth: Payer: Self-pay | Admitting: Internal Medicine

## 2010-10-14 ENCOUNTER — Inpatient Hospital Stay (HOSPITAL_COMMUNITY)
Admission: AD | Admit: 2010-10-14 | Discharge: 2010-10-23 | DRG: 864 | Disposition: A | Discharge status: Home / Self Care | Payer: 59 | Source: Ambulatory Visit | Attending: Gastroenterology | Admitting: Gastroenterology

## 2010-10-14 DIAGNOSIS — N200 Calculus of kidney: Secondary | ICD-10-CM | POA: Diagnosis present

## 2010-10-14 DIAGNOSIS — R21 Rash and other nonspecific skin eruption: Secondary | ICD-10-CM | POA: Diagnosis not present

## 2010-10-14 DIAGNOSIS — Z79899 Other long term (current) drug therapy: Secondary | ICD-10-CM | POA: Insufficient documentation

## 2010-10-14 DIAGNOSIS — R112 Nausea with vomiting, unspecified: Secondary | ICD-10-CM

## 2010-10-14 DIAGNOSIS — E860 Dehydration: Secondary | ICD-10-CM

## 2010-10-14 DIAGNOSIS — R509 Fever, unspecified: Principal | ICD-10-CM | POA: Diagnosis present

## 2010-10-14 DIAGNOSIS — R1013 Epigastric pain: Secondary | ICD-10-CM | POA: Diagnosis present

## 2010-10-14 DIAGNOSIS — F3289 Other specified depressive episodes: Secondary | ICD-10-CM | POA: Diagnosis present

## 2010-10-14 DIAGNOSIS — R1011 Right upper quadrant pain: Secondary | ICD-10-CM | POA: Diagnosis present

## 2010-10-14 DIAGNOSIS — K5900 Constipation, unspecified: Secondary | ICD-10-CM | POA: Diagnosis not present

## 2010-10-14 DIAGNOSIS — K7689 Other specified diseases of liver: Secondary | ICD-10-CM | POA: Diagnosis present

## 2010-10-14 DIAGNOSIS — F329 Major depressive disorder, single episode, unspecified: Secondary | ICD-10-CM | POA: Diagnosis present

## 2010-10-14 DIAGNOSIS — K5080 Crohn's disease of both small and large intestine without complications: Secondary | ICD-10-CM | POA: Diagnosis present

## 2010-10-14 DIAGNOSIS — D849 Immunodeficiency, unspecified: Secondary | ICD-10-CM

## 2010-10-14 DIAGNOSIS — N289 Disorder of kidney and ureter, unspecified: Secondary | ICD-10-CM | POA: Diagnosis present

## 2010-10-14 DIAGNOSIS — B259 Cytomegaloviral disease, unspecified: Secondary | ICD-10-CM | POA: Diagnosis present

## 2010-10-14 DIAGNOSIS — Z8619 Personal history of other infectious and parasitic diseases: Secondary | ICD-10-CM

## 2010-10-14 HISTORY — DX: Personal history of other infectious and parasitic diseases: Z86.19

## 2010-10-14 HISTORY — DX: Crohn's disease, unspecified, without complications: K50.90

## 2010-10-14 NOTE — Telephone Encounter (Signed)
I called patient - still febrile and feeling bad with nausea and vomiting. He needs a direct admit to hospital - [place] with observation admit for nausea, vomiting and fever with dehydration - immunosuppressed  Dr. Ardis Hughs knows and agrees Plan is for labs, EGD, ID consult also (? CMV) [place] is to hold azathioprine also and we need to postpone any Remicade Tx  I will talk to [place] also -

## 2010-10-14 NOTE — Telephone Encounter (Signed)
I have contacted [facility] they will call me back when a bed is available.  Patient is aware that I will contact him when the bed is available.

## 2010-10-14 NOTE — Telephone Encounter (Signed)
Patient advised his bed is ready and he should go to [facility] admitting.  He will go to bed 1343

## 2010-10-14 NOTE — Progress Notes (Signed)
Quick Note:  Labs suggestive of virus. How is he today? ______

## 2010-10-14 NOTE — Progress Notes (Signed)
  Subjective:    Patient ID: [name] [name], male    DOB: 1976/05/15, 35 y.o.   MRN: [id number]  HPI  35 year old white man with Crohn's disease of the large and small intestine, on chronic Remicade therapy. Approximately 5 days ago he became sick with nausea, vomiting and severe abdominal cramps. He had diarrhea as well. He has had fevers up to 103. He went to the emergency department 3 days ago where he was evaluated with labs and a CT scan which did not show any significant GI pathology. There is a small lesion that was recommended for. Since then he has been about the same with persistent nausea and vomiting though he is keeping some liquids in. He feels weak and has myalgias. He's had chills. Today his MAXIMUM TEMPERATURE was 101.4. He is drinking Gatorade and eating [place] ices. He is having sweats. He has eaten some solid food but not much. He is not having diarrhea at this point and there never was much. He is really not having his bowels much at all. No sick contacts or travel no new medications.  Review of Systems    negative for skin rash, cough sinus symptoms, no urinary symptoms. Objective:   Physical Exam Well-developed mildly ill obese white man Eyes anicteric Mouth posterior pharynx clear Neck supple Lungs clear Heart S1-S2 no rubs murmurs or gallops Abdomen is obese soft moderately tender in the upper abdomen without rebound or guarding. Bowel sounds present. Anorectal area is inspected and there is no fistula or inflammatory change. Extremities without edema Skin no rash Alert oriented x3        Assessment & Plan:  #1 nausea and vomiting with fever. This seems to be a gastroenteritis and not his Crohn's disease based upon all the available information. Drug reaction to his immunomodulating Remicade as possible and would favor the former over the latter if at all. It's unlikely. I will assess with labs to include CBC CMET amylase and lipase. He is to continue liquids  and supportive care at this point. We will check on him tomorrow by phone.  #2 left kidney lesion on CT, unclear significance "There is a small mildly complex exophytic 1.0 cm lesion noted  arising at the interpole region of the left kidney, with question of peripheral enhancement. This appears to have increased in size  from the prior study. Renal ultrasound would be helpful for further evaluation."  We'll arrange renal ultrasound at some point to follow this up I don't think is related to his current problems.  #3 Crohn's disease large and small intestine, I do not think this is symptomatic at this time  #4 chronic immunosuppression with immunomodulator and Remicade therapy this puts him at increased risk of infections including viral infections. We'll keep this in mind. Have not yet held his therapy it depends upon the clinical course and findings. He needs more vaccine evaluation and follow-up after this.

## 2010-10-14 NOTE — Telephone Encounter (Signed)
Not feeling any better he states she is actually feeling worse..  Still having chills, sweats and vomiting. Denies diarrhea just had a solid stool "for the first time in days".  Temp this am 101.  Dr Carlean Purl please advise

## 2010-10-14 NOTE — Progress Notes (Signed)
Reviewed with the patient see phone note from 10/14/10 for further documentation

## 2010-10-15 ENCOUNTER — Other Ambulatory Visit: Payer: Self-pay | Admitting: Gastroenterology

## 2010-10-15 DIAGNOSIS — R112 Nausea with vomiting, unspecified: Secondary | ICD-10-CM

## 2010-10-15 DIAGNOSIS — K5080 Crohn's disease of both small and large intestine without complications: Secondary | ICD-10-CM

## 2010-10-15 DIAGNOSIS — R1013 Epigastric pain: Secondary | ICD-10-CM

## 2010-10-15 DIAGNOSIS — K2970 Gastritis, unspecified, without bleeding: Secondary | ICD-10-CM

## 2010-10-15 DIAGNOSIS — I38 Endocarditis, valve unspecified: Secondary | ICD-10-CM

## 2010-10-15 DIAGNOSIS — K2990 Gastroduodenitis, unspecified, without bleeding: Secondary | ICD-10-CM

## 2010-10-15 HISTORY — PX: UPPER GASTROINTESTINAL ENDOSCOPY: SHX188

## 2010-10-15 LAB — COMPREHENSIVE METABOLIC PANEL
ALT: 30 U/L (ref 0–53)
AST: 56 U/L — ABNORMAL HIGH (ref 0–37)
Calcium: 7.8 mg/dL — ABNORMAL LOW (ref 8.4–10.5)
Creatinine, Ser: 0.81 mg/dL (ref 0.4–1.5)
GFR calc Af Amer: >60 mL/min (ref >60)
Sodium: 136 mEq/L (ref 135–145)
Total Protein: 6.8 g/dL (ref 6.0–8.3)

## 2010-10-15 LAB — CBC
MCHC: 32.8 g/dL (ref 30.0–36.0)
Platelets: 163 10*3/uL (ref 150–400)
RDW: 15.1 % (ref 11.5–15.5)

## 2010-10-16 ENCOUNTER — Observation Stay (HOSPITAL_COMMUNITY): Payer: 59

## 2010-10-16 ENCOUNTER — Other Ambulatory Visit (HOSPITAL_COMMUNITY): Payer: 59

## 2010-10-16 DIAGNOSIS — R1013 Epigastric pain: Secondary | ICD-10-CM

## 2010-10-16 DIAGNOSIS — K2990 Gastroduodenitis, unspecified, without bleeding: Secondary | ICD-10-CM

## 2010-10-16 DIAGNOSIS — K5080 Crohn's disease of both small and large intestine without complications: Secondary | ICD-10-CM

## 2010-10-16 DIAGNOSIS — R112 Nausea with vomiting, unspecified: Secondary | ICD-10-CM

## 2010-10-16 DIAGNOSIS — K2970 Gastritis, unspecified, without bleeding: Secondary | ICD-10-CM

## 2010-10-16 LAB — EPSTEIN-BARR VIRUS VCA ANTIBODY PANEL
EBV NA IgG: 2.45 {ISR} — ABNORMAL HIGH
EBV VCA IgG: 4.67 {ISR} — ABNORMAL HIGH
EBV VCA IgM: 0.36 {ISR}

## 2010-10-17 ENCOUNTER — Inpatient Hospital Stay (HOSPITAL_COMMUNITY): Payer: 59

## 2010-10-17 ENCOUNTER — Encounter (HOSPITAL_COMMUNITY): Payer: Self-pay | Admitting: Radiology

## 2010-10-17 DIAGNOSIS — K2970 Gastritis, unspecified, without bleeding: Secondary | ICD-10-CM

## 2010-10-17 DIAGNOSIS — R112 Nausea with vomiting, unspecified: Secondary | ICD-10-CM

## 2010-10-17 DIAGNOSIS — K5080 Crohn's disease of both small and large intestine without complications: Secondary | ICD-10-CM

## 2010-10-17 DIAGNOSIS — K2990 Gastroduodenitis, unspecified, without bleeding: Secondary | ICD-10-CM

## 2010-10-17 DIAGNOSIS — R1013 Epigastric pain: Secondary | ICD-10-CM

## 2010-10-17 LAB — DIFFERENTIAL
Basophils Absolute: 0.2 10*3/uL — ABNORMAL HIGH (ref 0.0–0.1)
Basophils Relative: 4 % — ABNORMAL HIGH (ref 0–1)
Eosinophils Relative: 0 % (ref 0–5)
Lymphocytes Relative: 51 % — ABNORMAL HIGH (ref 12–46)
Lymphs Abs: 2.8 10*3/uL (ref 0.7–4.0)
Monocytes Relative: 10 % (ref 3–12)
Neutro Abs: 2 10*3/uL (ref 1.7–7.7)

## 2010-10-17 LAB — COMPREHENSIVE METABOLIC PANEL
ALT: 58 U/L — ABNORMAL HIGH (ref 0–53)
AST: 114 U/L — ABNORMAL HIGH (ref 0–37)
Alkaline Phosphatase: 93 U/L (ref 39–117)
Calcium: 8.5 mg/dL (ref 8.4–10.5)
GFR calc Af Amer: >60 mL/min (ref >60)
Glucose, Bld: 133 mg/dL — ABNORMAL HIGH (ref 70–99)
Potassium: 3.6 mEq/L (ref 3.5–5.1)
Sodium: 137 mEq/L (ref 135–145)
Total Protein: 7.4 g/dL (ref 6.0–8.3)

## 2010-10-17 LAB — CBC
MCH: 31 pg (ref 26.0–34.0)
MCHC: 33.3 g/dL (ref 30.0–36.0)
MCV: 93.3 fL (ref 78.0–100.0)
Platelets: 182 10*3/uL (ref 150–400)
RDW: 15.4 % (ref 11.5–15.5)
WBC: 5.6 10*3/uL (ref 4.0–10.5)

## 2010-10-17 LAB — LACTATE DEHYDROGENASE: LDH: 440 U/L — ABNORMAL HIGH (ref 94–250)

## 2010-10-17 LAB — CK TOTAL AND CKMB (NOT AT ARMC): Total CK: 34 U/L (ref 7–232)

## 2010-10-17 MED ORDER — IOHEXOL 300 MG/ML  SOLN
80.0000 mL | Freq: Once | INTRAMUSCULAR | Status: AC | PRN
  Administered 2010-10-17: 80 mL via INTRAVENOUS

## 2010-10-19 LAB — HEPATIC FUNCTION PANEL
ALT: 68 U/L — ABNORMAL HIGH (ref 0–53)
Indirect Bilirubin: 0.3 mg/dL (ref 0.3–0.9)
Total Protein: 7.5 g/dL (ref 6.0–8.3)

## 2010-10-19 LAB — BASIC METABOLIC PANEL
BUN: 9 mg/dL (ref 6–23)
Calcium: 8.8 mg/dL (ref 8.4–10.5)
GFR calc non Af Amer: >60 mL/min (ref >60)
Potassium: 4.1 mEq/L (ref 3.5–5.1)
Sodium: 137 mEq/L (ref 135–145)

## 2010-10-19 LAB — HEPATITIS PANEL, ACUTE
Hep A IgM: NEGATIVE
Hep B C IgM: NEGATIVE
Hepatitis B Surface Ag: NEGATIVE

## 2010-10-19 LAB — CBC
MCV: 94.4 fL (ref 78.0–100.0)
Platelets: 225 10*3/uL (ref 150–400)
RDW: 15.7 % — ABNORMAL HIGH (ref 11.5–15.5)
WBC: 12.8 10*3/uL — ABNORMAL HIGH (ref 4.0–10.5)

## 2010-10-20 ENCOUNTER — Inpatient Hospital Stay (HOSPITAL_COMMUNITY): Payer: 59

## 2010-10-20 DIAGNOSIS — R1013 Epigastric pain: Secondary | ICD-10-CM

## 2010-10-20 DIAGNOSIS — K5080 Crohn's disease of both small and large intestine without complications: Secondary | ICD-10-CM

## 2010-10-20 DIAGNOSIS — R932 Abnormal findings on diagnostic imaging of liver and biliary tract: Secondary | ICD-10-CM

## 2010-10-20 LAB — ANTI-NUCLEAR AB-TITER (ANA TITER)

## 2010-10-20 LAB — ANA: Anti Nuclear Antibody(ANA): POSITIVE — AB

## 2010-10-20 MED ORDER — TECHNETIUM TC 99M MEBROFENIN IV KIT: 5.5000 | PACK | Freq: Once | INTRAVENOUS | Status: AC | PRN

## 2010-10-20 MED ORDER — FLUDEOXYGLUCOSE F - 18 (FDG) INJECTION
14.9000 | Freq: Once | INTRAVENOUS | Status: AC | PRN
  Administered 2010-10-20: 14.9 via INTRAVENOUS

## 2010-10-20 MED ORDER — SINCALIDE 5 MCG IJ SOLR: 0.0200 ug/kg | Freq: Once | INTRAMUSCULAR | Status: DC

## 2010-10-21 ENCOUNTER — Inpatient Hospital Stay (HOSPITAL_COMMUNITY): Payer: 59

## 2010-10-21 DIAGNOSIS — R112 Nausea with vomiting, unspecified: Secondary | ICD-10-CM

## 2010-10-21 DIAGNOSIS — K5080 Crohn's disease of both small and large intestine without complications: Secondary | ICD-10-CM

## 2010-10-21 DIAGNOSIS — R1013 Epigastric pain: Secondary | ICD-10-CM

## 2010-10-21 DIAGNOSIS — R932 Abnormal findings on diagnostic imaging of liver and biliary tract: Secondary | ICD-10-CM

## 2010-10-21 LAB — IGG, IGA, IGM
IgA: 333 mg/dL (ref 68–378)
IgM, Serum: 170 mg/dL (ref 60–263)

## 2010-10-21 LAB — PROTEIN ELECTROPH W RFLX QUANT IMMUNOGLOBULINS
Alpha-1-Globulin: 7.2 % — ABNORMAL HIGH (ref 2.9–4.9)
Alpha-2-Globulin: 11.1 % (ref 7.1–11.8)
Beta 2: 6.1 % (ref 3.2–6.5)
Beta Globulin: 7.7 % — ABNORMAL HIGH (ref 4.7–7.2)
Gamma Globulin: 17.4 % (ref 11.1–18.8)

## 2010-10-21 LAB — CMV (CYTOMEGALOVIRUS) DNA ULTRAQUANT, PCR: CMV DNA Quant: 3079 copies/mL — ABNORMAL HIGH (ref <200)

## 2010-10-21 LAB — IMMUNOFIXATION ADD-ON

## 2010-10-22 ENCOUNTER — Encounter (HOSPITAL_COMMUNITY): Payer: 59

## 2010-10-22 DIAGNOSIS — K2990 Gastroduodenitis, unspecified, without bleeding: Secondary | ICD-10-CM

## 2010-10-22 DIAGNOSIS — R1013 Epigastric pain: Secondary | ICD-10-CM

## 2010-10-22 DIAGNOSIS — R509 Fever, unspecified: Secondary | ICD-10-CM

## 2010-10-22 DIAGNOSIS — K5080 Crohn's disease of both small and large intestine without complications: Secondary | ICD-10-CM

## 2010-10-22 DIAGNOSIS — K2970 Gastritis, unspecified, without bleeding: Secondary | ICD-10-CM

## 2010-10-22 LAB — CULTURE, BLOOD (ROUTINE X 2)
Culture  Setup Time: 201204260215
Culture: NO GROWTH

## 2010-10-23 DIAGNOSIS — K2990 Gastroduodenitis, unspecified, without bleeding: Secondary | ICD-10-CM

## 2010-10-23 DIAGNOSIS — K5080 Crohn's disease of both small and large intestine without complications: Secondary | ICD-10-CM

## 2010-10-23 DIAGNOSIS — K2970 Gastritis, unspecified, without bleeding: Secondary | ICD-10-CM

## 2010-10-23 DIAGNOSIS — R1013 Epigastric pain: Secondary | ICD-10-CM

## 2010-10-23 DIAGNOSIS — M79609 Pain in unspecified limb: Secondary | ICD-10-CM

## 2010-10-23 DIAGNOSIS — R932 Abnormal findings on diagnostic imaging of liver and biliary tract: Secondary | ICD-10-CM

## 2010-10-24 ENCOUNTER — Encounter: Payer: Self-pay | Admitting: Pulmonary Disease

## 2010-10-24 ENCOUNTER — Telehealth: Payer: Self-pay | Admitting: Internal Medicine

## 2010-10-24 LAB — HISTOPLASMA ANTIGEN, URINE

## 2010-10-24 NOTE — Telephone Encounter (Signed)
Left message for patient to call back  

## 2010-10-24 NOTE — Telephone Encounter (Signed)
Dr Carlean Purl spoke with the patient. He is to call back next week with an update.

## 2010-10-27 ENCOUNTER — Encounter: Payer: Self-pay | Admitting: Internal Medicine

## 2010-10-28 ENCOUNTER — Ambulatory Visit (INDEPENDENT_AMBULATORY_CARE_PROVIDER_SITE_OTHER): Payer: 59 | Admitting: Internal Medicine

## 2010-10-28 ENCOUNTER — Telehealth: Payer: Self-pay | Admitting: Pulmonary Disease

## 2010-10-28 ENCOUNTER — Ambulatory Visit (INDEPENDENT_AMBULATORY_CARE_PROVIDER_SITE_OTHER): Payer: 59 | Admitting: Pulmonary Disease

## 2010-10-28 ENCOUNTER — Encounter: Payer: Self-pay | Admitting: Internal Medicine

## 2010-10-28 ENCOUNTER — Encounter: Payer: Self-pay | Admitting: Pulmonary Disease

## 2010-10-28 VITALS — BP 138/92 | HR 104 | Temp 98.4°F | Ht 68.0 in | Wt 218.8 lb

## 2010-10-28 DIAGNOSIS — B259 Cytomegaloviral disease, unspecified: Secondary | ICD-10-CM

## 2010-10-28 DIAGNOSIS — E669 Obesity, unspecified: Secondary | ICD-10-CM

## 2010-10-28 DIAGNOSIS — G4733 Obstructive sleep apnea (adult) (pediatric): Secondary | ICD-10-CM

## 2010-10-28 NOTE — Progress Notes (Signed)
Subjective:    Patient ID: [name] [name], male    DOB: [birth year], 35 y.o.   MRN: [id number]  HPI 35 yo male for evaluation of sleep apnea.  He had a sleep test years ago.  He was told he had sleep apnea, and started on CPAP.  He thinks his wife took his CPAP after his divorce.  He is not sure how much CPAP was helping.  His mask used to shift a lot, and his would wake him up.  He feels tired and sleepy all the time.  He tried using klonopin to help sleep, but this made no difference.  He is a restless sleeper.  He does snore.  He will wake up with a cough.  He goes to bed at 11pm.  He is not currently using anything to help sleep.  He falls asleep quickly, and will wake up once during the night.  He gets out of bed at 630am.  He occasionally gets headaches in the morning.  He drinks lots of caffeine during the day to stay awake.  He will occasionally talk in his sleep, and gets cramps in his legs.  The patient denies sleep walking, bruxism, or nightmares.  There is no history of restless legs.  The patient denies sleep hallucinations, sleep paralysis, or cataplexy.  He lost about 12 lbs after he was treated for CMV infection.  There is no history of thyroid disease.  His father has sleep apnea, and uses CPAP.  He does not smoke cigarettes, and has occasional alcohol use.  Past Medical History  Diagnosis Date  . Obstructive sleep apnea (adult) (pediatric)   . Depressive disorder, not elsewhere classified   . Anxiety state, unspecified   . Essential hypertension, benign   . Obesity, unspecified   . Herpes zoster infection 07/2010  . Vitamin D deficiency   . Crohn's disease   . Hyperlipidemia   . CMV (cytomegalovirus infection) 10/28/2010     Family History  Problem Relation Age of Onset  . Heart disease Father     CAD/MI  . Hyperlipidemia Father   . Hypertension Father   . Gout Father   . Sleep apnea Father   . Colon cancer Neg Hx   . Diabetes Neg Hx   . COPD Neg Hx   .  Obesity Mother   . Stroke Maternal Grandfather   . Allergies Sister   . Allergies Mother   . Asthma Sister   . Cancer Mother      History   Social History  . Marital Status: Legally Separated    Spouse Name: N/A    Number of Children: 2  . Years of Education: 14   Occupational History  . [employer]  .     Social History Main Topics  . Smoking status: Never Smoker   . Smokeless tobacco: Never Used  . Alcohol Use: 0.5 oz/week    1 drink(s) per week     ocass  . Drug Use: No  . Sexually Active: Not Currently -- Male partner(s)   Other Topics Concern  . Not on file   Social History Narrative   HSG, [employer] - 2 years. Married '99 - 59yr/divorced. 2 sons - '98, '00 split custody. 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 No history of physical or sexual abuse.      No Known Allergies   Outpatient Prescriptions Prior to Visit  Medication Sig Dispense Refill  .  acetaminophen (TYLENOL) 325 MG tablet Take 650 mg by mouth as needed.        . inFLIXimab (REMICADE) 100 MG injection Dx crohn's  Infuse 5 mg/kg IV every 6 weeks at [facility].  Weight 232 lbs. Benadryl 25-50 mg po prior to infusion.  Tylenol 625 mg po prior to infusion Last PPD skin test negative 07/17/10   1 each  3  . PARoxetine (PAXIL) 40 MG tablet Take 40 mg by mouth every morning.        . valGANciclovir (VALCYTE) 450 MG tablet Take 450 mg by mouth 2 (two) times daily. 2 tablets bid       . azaTHIOprine (IMURAN) 50 MG tablet 4 by mouth every day       . ibuprofen (ADVIL,MOTRIN) 200 MG tablet Take 200 mg by mouth as needed.            Review of Systems  Constitutional: Positive for appetite change and unexpected weight change.  Respiratory: Positive for shortness of breath.   Gastrointestinal: Positive for abdominal pain.  Neurological: Positive for headaches.  Psychiatric/Behavioral: Positive for dysphoric mood.       Objective:    Physical Exam Filed Vitals:   10/28/10 1621  BP: 138/92  Pulse: 104  Temp: 98.4 F (36.9 C)  TempSrc: Oral  Height: 5' 8"  (1.727 m)  Weight: 218 lb 12.8 oz (99.247 kg)  SpO2: 97%   General - Obese, healthy, no distress HEENT - PERRLA, EOMI, No sinus tenderness/discharge, MP 3, 3+ tonsills, no oral exudate, no LAN, no thyromegaly Cardiac - S1S2 regular, no murmur, pulses symmetric Chest - CTA Abd - soft, obese, non-tender Ext - no e/c/c Neuro - normal strength, CN intact, A&O x 3 Psych - normal mood/behavior  Assessment & Plan:   OBSTRUCTIVE SLEEP APNEA He has sleep disruption, snoring, and daytime sleepiness.  He has a history of depression.  He has prior history of sleep apnea, but sleep test was done several years ago.  He likely still has sleep apnea.  I explained how sleep apnea can affect the patient's health.  Driving precautions and importance of weight loss were discussed.  Treatment options for sleep apnea were reviewed.  To further assess will arrange for repeat sleep study.    Updated Medication List Outpatient Encounter Prescriptions as of 10/28/2010  Medication Sig Dispense Refill  . acetaminophen (TYLENOL) 325 MG tablet Take 650 mg by mouth as needed.        . inFLIXimab (REMICADE) 100 MG injection Dx crohn's  Infuse 5 mg/kg IV every 6 weeks at [facility].  Weight 232 lbs. Benadryl 25-50 mg po prior to infusion.  Tylenol 625 mg po prior to infusion Last PPD skin test negative 07/17/10   1 each  3  . PARoxetine (PAXIL) 40 MG tablet Take 40 mg by mouth every morning.        . promethazine (PHENERGAN) 25 MG tablet prn      . valGANciclovir (VALCYTE) 450 MG tablet Take 450 mg by mouth 2 (two) times daily. 2 tablets bid       . azaTHIOprine (IMURAN) 50 MG tablet 4 by mouth every day       . ibuprofen (ADVIL,MOTRIN) 200 MG tablet Take 200 mg by mouth as needed.

## 2010-10-28 NOTE — Telephone Encounter (Signed)
done

## 2010-10-28 NOTE — Progress Notes (Signed)
Subjective:    Patient ID: [name] [name], male    DOB: 1976/05/07, 34 y.o.   MRN: [id number]  HPI [name] [name] presents to establish for on-going continuity care. He was recently hospitalized for poor po intake with dehydration with a final diagnosis of CMV - [name] [name]. He was discharges last Thursday - continues on [place]. He does have temperature spikes and on/off not feeling well.   Past Medical History  Diagnosis Date  . Obstructive sleep apnea (adult) (pediatric)   . Depressive disorder, not elsewhere classified   . Anxiety state, unspecified   . Essential hypertension, benign   . Obesity, unspecified   . Herpes zoster infection 07/2010  . Vitamin D deficiency   . Crohn's disease    Past Surgical History  Procedure Date  . Small intestine surgery     1 ft removed   Family History  Problem Relation Age of Onset  . Heart disease Father     CAD/MI  . Hyperlipidemia Father   . Hypertension Father   . Gout Father   . Sleep apnea Father   . Colon cancer Neg Hx   . Cancer Neg Hx     colon  . Diabetes Neg Hx   . COPD Neg Hx   . Obesity Mother   . Stroke Maternal Grandfather    History   Social History  . Marital Status: Legally Separated    Spouse Name: N/A    Number of Children: 2  . Years of Education: 14   Occupational History  . [employer]  .     Social History Main Topics  . Smoking status: Never Smoker   . Smokeless tobacco: Never Used  . Alcohol Use: 0.5 oz/week    1 drink(s) per week     ocass  . Drug Use: No  . Sexually Active: Not Currently -- Male partner(s)   Other Topics Concern  . Not on file   Social History Narrative   HSG, [employer] - 2 years. Married '99 - 54yr/divorced. 2 sons - '98, '00 split custody. 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 No history of physical or sexual abuse.        Review of Systems Review of Systems  Constitutional:  Positive for fevers &  chills,  decreased activity and unexpected weight change with 13 lb loss.  HENT:  Negative for hearing loss, ear pain, congestion, neck stiffness and postnasal drip.   Eyes: Negative for pain, discharge and visual disturbance.  Respiratory: Negative for chest tightness and wheezing.   Cardiovascular: Negative for chest pain and palpitations.       [No decreased exercise tolerance Gastrointestinal: [ change in bowel habit with constipation now resolved. No bloating or gas. No reflux or indigestion Genitourinary: Negative for urgency, frequency, flank pain and difficulty urinating.  Musculoskeletal: Postive  for myalgias, back pain, arthralgias.  Neurological: Negative for dizziness, tremors, weakness and headaches.  Hematological: Negative for adenopathy.  Psychiatric/Behavioral: Negative for behavioral problems and dysphoric mood.        Objective:   Physical Exam Overweight white male in no distress HEENT normal Neck - normal Chest Clear Cor - RRR Abd- obese       Assessment & Plan:  1. ID - recovering from CMV infection. All hospital records reviewed.  Plan - follow-up with GI as instructed  2. Crohn's - stable  3. Sleep Apnea - he is to see pulmonary latter  today for follow-up and determination as to his candidacy for CPAP  4. Health maintenance - discussed the important of weight management and the "golden rules:" smart food choices, portion size control (meal of 100 chews, regular exercise. [employer] weiht 175 lbs. Goal to loose 1-2 lbs per month.  In summary - a nice man who presents to establish for care. He is asked to return in the near future for a more complete exam, monitoring of his recovery from CMV and for weigh in.

## 2010-10-28 NOTE — Patient Instructions (Signed)
Will schedule sleep test Will call to schedule follow up after sleep test reviewed

## 2010-10-28 NOTE — Assessment & Plan Note (Addendum)
He has sleep disruption, snoring, and daytime sleepiness.  He has a history of depression.  He has prior history of sleep apnea, but sleep test was done several years ago.  He likely still has sleep apnea.  I explained how sleep apnea can affect the patient's health.  Driving precautions and importance of weight loss were discussed.  Treatment options for sleep apnea were reviewed.  To further assess will arrange for repeat sleep study.

## 2010-10-29 ENCOUNTER — Telehealth: Payer: Self-pay | Admitting: Internal Medicine

## 2010-10-29 NOTE — Telephone Encounter (Signed)
Patient is advised he should take his forms to [name] and they will fill them out and Dr Carlean Purl will sign them.

## 2010-11-02 ENCOUNTER — Encounter: Payer: Self-pay | Admitting: Pulmonary Disease

## 2010-11-04 NOTE — Op Note (Signed)
[name] [name], [name] [name]               ACCOUNT NO.:  [id number]   MEDICAL RECORD NO.:  [id number]          PATIENT TYPE:  INP   LOCATION:  5156                         FACILITY:  [facility]   PHYSICIAN:  Adin Hector, MD     DATE OF BIRTH:  [birth year]   DATE OF PROCEDURE:  08/05/2007  DATE OF DISCHARGE:                               OPERATIVE REPORT   PRIMARY CARE PHYSICIAN:  Luanne Bras at [facility]   GASTROENTEROLOGIST:  Gatha Mayer, MD,FACG or at least with [facility].   PREOPERATIVE DIAGNOSIS:  Crohn disease with small bowel obstruction and  perforations.   POSTOPERATIVE DIAGNOSIS:  Crohn disease with small bowel obstruction and  perforations.   PROCEDURE:  1. Exploratory laparotomy.  2. Ileocecectomy with primary anastomosis.   SURGEON:  Adin Hector, MD   ASSISTANT:  Edsel Petrin. Dalbert Batman, M.D.   ANESTHESIA:  General anesthesia   SPECIMENS:  About 6 cm of terminal ileum, cecum and 6 cm of ascending  colon.   DRAINS:  None.   ESTIMATED BLOOD LOSS:  250 ml.   COMPLICATIONS:  None immediate.   INDICATION:  [name] [name] is a 35 year old male who struggled with Crohn  disease with ileocecal involvement.  He has been treated with Remicade  in the past, but it has been transitioned over to steroids.  He started  having worsening abdominal pain and symptoms concerning for obstructive  symptoms.  He was admitted and hydrated.  He underwent a CAT scan which  showed evidence of significant mesenteric phlegmon and air concerning  for perforation into the ileocecal region.  He was severely tender, and  his white count was elevated.  Basic concerns and options were  discussed.  Recommendation was made for exploration of the abdomen with  resection with possibility of ostomy or anastomosis.   The risks such as  stroke, heart attack, deep vein thrombosis, pulmonary  embolism were discussed.  Risks such as bleeding, need for transfusion,  wound infection, abscess, injury to the  organs, anastomotic breakdown,  and the need for ileostomy and other risks were discussed.  Questions  were answered.  He and his family agreed to proceed.   OPERATIVE FINDINGS:  His terminal ileum, probably the distal foot of his  ileum was slightly thickened but no major creeping of fat.  No  intraluminal thickening.  However,near the ileocecal valve, he had a  very large phlegmonous mass with a very thickened mesentery.  This  involved part of his proximal descending colon as well.  From the  hepatic flexure distally was not involved.  There was no evidence of any  other significant involvement.   DESCRIPTION OF PROCEDURE:  Informed consent was confirmed.  The patient  was positioned supine, with arms out.  He had sequential compressive  devices active during the entire case.  He underwent general anesthesia  without any difficulty.  He had a nasogastric tube placed.  His abdomen  was prepped and draped in sterile fashion.   Entry was gained in the abdomen through a upper midline laparotomy  incision.  This was carried  infraumbilically and further down given to  his very large body habitus.  Entry into the abdomen revealed no mass or  free air.  We initially had to use a [place] to help to get adequate  exposure.  I could feel a large phlegmonous mass in the right lateral  abdomen at the ileocecal junction.  The colon was mobilized from the  cecum all the way to the mid transverse colon and retromesenteric  mobilization was done.  The terminal ileal mesentery was freed off as  well.  This allowed the mass to be mobilized into the wound consistent  with a large phlegmonous mass as noted above.  There was no massive  contamination noted.   We elected to resect the mass.  Therefore about 6 inches proximal to the  ileocecal valve, we made a window in the terminal ileum, and the  mesentery was taken with LigaSure more distally.  Another window was  made in the mid ascending colon,  in the more normal area with less  inflamed colon, and the mesentery was taken more proximally gradually  over time, and we were able to use the LigaSure to help ligate through  the mesentery staying close to the terminal ileum and cecum.  Once  everything was completely freed off, a side-to-side staple anastomosis  was done with a 75 GIA stapler.  The stapling defect was closed off  using a TA90 since the TA 60 was too small.  Specimen was sent off.  Inspection was made of the staple line.  Hemostasis was assured using  intermittent 2-0 silk stitches.  The mesenteric defect was closed using  figure-of-eight 2-0 silk stitches to good result.  Anastomosis was  widely patent and viable.  It was allowed to fall down in his right  lateral abdomen and upper abdomen.  Copious irrigation was done with  clear return.  Hemostasis was excellent.  The wound was closed using a  #1 PDS looped stitch, and the skin was closed with staples.  Sterile  dressing was applied.  The patient was extubated and taken to the  recovery room in stable condition.   I explained the operative findings to the patient's significant other.  Postoperative instructions were discussed.  She expressed understanding  and appreciation.      Adin Hector, MD  Electronically Signed     SCG/MEDQ  D:  08/05/2007  T:  08/08/2007  Job:  [id number]   cc:   Gatha Mayer, MD,FACG  Luanne Bras

## 2010-11-04 NOTE — Assessment & Plan Note (Signed)
[facility]                         GASTROENTEROLOGY OFFICE NOTE   [name] [name], [name] [name]                      MRN:          [id number]  DATE:07/20/2007                            DOB:          04/11/1976    CHIEF COMPLAINT:  Followup of Crohn's disease.   [name] [name] returns after about a year's absence.  He has Crohn's ileitis  with history of perianal fistulae.  His last imaging was a CT  enterography July 29, 2006, which showed distal and terminal ileum  mural enhancement consistent with Crohn's disease.  He had gone on some  Remicade about that time and had a few treatments, but due to insurance  issues had to stop and has been maintained on prednisone.  He cannot  really get below 10 mg of prednisone without symptoms.  He is now on  Medicaid and has insurance again.  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  He is not a smoker.  He does not  use alcohol.  He is otherwise doing well, but wants to get off the  prednisone obviously. No fistula activity.  His symptoms are controlled  reasonably well on 10 mg of prednisone daily.   REVIEW OF SYSTEMS:  Also includes some headaches.  Recently he was found  to have elevated blood pressure and was started on lisinopril with  resolution of symptoms and he is normotensive.   PAST MEDICAL HISTORY:  1. Crohn's ileitis diagnosed a number of years ago with history of      fistulizing disease.  He has been on and off Remicade in other      areas as well as in [place].  2. Last colonoscopy was in [place].  Do not have those records.  3. Obesity.  4. Fatty liver.  5. Sleep apnea.  Has been on CPAP.  6. History of anxiety.  7. Hypertension.   No known drug allergies.   PHYSICAL EXAMINATION:  Reveals an obese white man in no acute distress.  Weight:  242 pounds.  He is somewhat Cushingoid.  Pulse:  76.  Blood  pressure:  122/72.  EYES:  Anicteric.  NECK:  Supple.  No  thyromegaly, no mass.  CHEST:  Clear.  HEART:  S1, S2.  No rubs or gallops.  ABDOMEN:  Obese, soft, nontender.  No organomegaly or mass.  RECTAL:  Area shows no perianal change.  There are a few minor, fleshy  skin tags in the canal.  SKIN:  He has a low back tattoo.  Otherwise free of rash or lesions.  PSYCH:  He is alert and oriented x3.  Appropriate mod and affect.  There is no peripheral edema in the lower extremities.   ASSESSMENT:  1. Crohn's ileitis.  No active fistulizing disease now fortunately.  2. Obesity.  3. Hypertension.  Question if it is related to his prednisone.  4. Chronic steroid use.   PLAN:  1. PPD today. Hepatitis B surface antigen, CBC, C-reactive protein,      CMET and a vitamin D level.  2. Reinitiate Remicade0.2 in six weeks  and then every 8 weeks.  He      understands the risks, benefits and indications of this and has      been on it before.  3. We will try to taper prednisone after that.  4. I will see him eight weeks after he starts his Remicade, sooner if      needed.  5. At some point a DEXA scan should be ordered due to his chronic      steroid use.     [name] Mayer, MD,FACG  Electronically Signed    CEG/MedQ  DD: 07/20/2007  DT: 07/20/2007  Job #: [id number]

## 2010-11-04 NOTE — Consult Note (Signed)
[name] [name], [name] [name]               ACCOUNT NO.:  [id number]   MEDICAL RECORD NO.:  [id number]          PATIENT TYPE:  INP   LOCATION:  5156                         FACILITY:  [facility]   PHYSICIAN:  Thomas A. Cornett, M.D.DATE OF BIRTH:  1975/07/04   DATE OF CONSULTATION:  DATE OF DISCHARGE:                                 CONSULTATION   REQUESTING PHYSICIAN:  Sheila Oats, M.D.   PRIMARY CARE PHYSICIAN:  Maebelle Munroe, N.P.   CONSULTING SURGEON:  Dr. Joyice Faster. Cornett, M.D.   GASTROENTEROLOGIST:  Gatha Mayer, MD,FACG   REASON FOR CONSULTATION:  Abdominal pain and CT finding of ascending  colon stricture with contained perforation.   HISTORY OF PRESENT ILLNESS:  This is a 35 year old white male who has a  history of Crohn's disease, obstructive sleep apnea, hypertension, and  dyslipidemia, who presented with an acute chronic flareup and abdominal  pain.  He states that his abdominal pain began yesterday afternoon.  He  says that, at that time, it was even hard to walk if he was just bent  over in pain.  At this time, he got associated nausea; however, has not  had any vomiting.  He states that his last bowel movement was several  days ago; however, he did not notice any blood in his stool.  Throughout  the day yesterday, his pain continued and just became more severe.  At  this time, he came to the ER.  Once he was found to have a white blood  cell count at 16,600 with neutrophils at 87%.  An incidental UTI was  also found, for which he was started on Cipro for.  A CT scan of his  abdomen and pelvis reveals an acute on-chronic inflammatory findings  with severe stricture of the ascending colon and subsequent obstruction  of the cecum and terminal ileum.  He was also suspicious for a contained  colon perforation.  It also shows some chronic diffuse colonic wall  thickening, without acute inflammation.  Because of all this, we were  consulted for possible surgical  intervention.   REVIEW OF SYSTEMS:  See HPI.  Otherwise, the patient complains of a dry  mouth as well as currently having a headache, otherwise all other  systems are negative.   FAMILY HISTORY:  Negative for Crohn disease; otherwise, is  noncontributory.   PAST MEDICAL HISTORY:  1. Crohn's disease.  2. Obesity.  3. Obstructive sleep apnea.  4. Hypertension.  5. Dyslipidemia.   PAST SURGICAL HISTORY:  None.   SOCIAL HISTORY:  He denies alcohol, tobacco, or any other illicit drug  use.  The patient states that he is married.   ALLERGIES:  NKDA.   MEDICATIONS:  1. Lexapro 10 mg.  2. Paxil 20 mg.  3. Prednisone 10 mg.  4. Xanax 0.25 mg.  5. Was given Remicade infusion 2 weeks ago.   PHYSICAL EXAM:  GENERAL:  This is a pleasant, well-developed, well-  nourished 35 year old white male, who is lying in bed in mild distress.  VITAL SIGNS:  Temperature 98, pulse 97, respirations 18, blood pressure  111/65.  HEENT:  Eyes:  Sclerae noninjected and PERRLA.  Ear, nose, and throat:  His mouth is pink and somewhat dry; otherwise, was negative.  NECK:  His trachea is midline, supple with no thyromegaly or masses are  noted.  RESPIRATIONS.  Lungs were clear to auscultation bilaterally with no  wheezes, rhonchi or rales noted.  Respiratory effort is normal.  HEART:  Regular rhythm with mild tachycardia.  S1 and S3 are noted with no  murmurs, gallops or rubs noted.  He does have palpable carotid and pedal  pulses, all at +2.  CHEST:  Symmetrical.  ABDOMEN:  Soft, diffusely tender but exquisitely tender in the right  lower quadrant, with some voluntary guarding.  The patient is somewhat  distended and has absent bowel sounds.  No masses, hernias or scars are  noted on the belly.  LYMPH:  No lymphadenopathy is noted in the patient's neck.  MUSCULOSKELETAL:  No clubbing or cyanosis or edema is noted in all four  extremities.  SKIN:  There are no masses, lesions or rashes.  NEURO:   Cranial nerves II-XII are grossly intact.  The patient is lying  in bed, and therefore his gait cannot be assessed at this time. PSYCH:  The patient is alert and oriented x3.   LABORATORY:  His affect is appropriate.   LABS:  White blood cell count is 16,600.  Hemoglobin is 14.9,  neutrophils are 87%, sodium 136, potassium 3.6, BUN 13, creatinine 1.0.   DIAGNOSTIC:  CT scan as noted in the HPI.   IMPRESSION:  1. Acute on-chronic flare up of Crohn's disease.  2. Severe a descending colon stricture with contained colon      perforation.  3. Hypertension.  4. Obstructive sleep apnea.  5. Dehydration.  6. Obesity.   PLAN:  At this time, we agree with the administration of Cipro and  Flagyl.  This will help with any infection that is in the abdomen at  this time, as well as with any infection that is found in his bladder.  At this time, we also agree with keeping the patient on n.p.o. status,  in case emergency surgery is needed today or any further surgery is  needed tomorrow.  The patient is also complaining of some nausea;  however, no vomiting, so at this time we do not feel  like an NG tube will be necessary.  We also agree with continuation of  his IV fluids.  The patient will probably need surgical intervention;  however, due to the complexity of the CT scan, I will await Dr.  Josetta Huddle evaluation of the patient, and at that time any further  recommendations will be per Dr. Brantley Stage.      Henreitta Cea, PA      Marcello Moores A. Cornett, M.D.  Electronically Signed    KEO/MEDQ  D:  08/05/2007  T:  08/07/2007  Job:  [id number]   cc:   Sheila Oats, M.D.  Gatha Mayer, MD,FACG  Luanne Bras, N.P.

## 2010-11-04 NOTE — Discharge Summary (Signed)
[name], [name]               ACCOUNT NO.:  [id number]   MEDICAL RECORD NO.:  [id number]          PATIENT TYPE:  INP   LOCATION:  5156                         FACILITY:  [facility]   PHYSICIAN:  Irine Seal, MD    DATE OF BIRTH:  05-24-1976   DATE OF ADMISSION:  08/04/2007  DATE OF DISCHARGE:  08/13/2007                               DISCHARGE SUMMARY   PRIMARY CARE PHYSICIAN/NURSE PRACTITIONER:  Luanne Bras of [facility] at [facility].   GASTROENTEROLOGIST:  Gatha Mayer, MD of [facility].   SURGEON:  Adin Hector, MD of [facility].   DISCHARGE DIAGNOSIS:  1. Crohn's flare with severe colonic stricture and contained micro      perforation status post exploratory laparotomy and ileocecectomy      with primary anastomosis performed on August 05, 2007 per Dr.      Johney Maine.  2. Postoperative ileus.  3. Escherichia coli urinary tract infection.  4. Obstructive sleep apnea.  5. Hypertension.  6. Depression.  7. Anxiety.  8. Hepatic steatosis   DISCHARGE MEDICATIONS:  1. Bactrim DS 1 tablet p.o. b.i.d. x1 week.  2. Percocet 5/325 one to two tablets p.o. q.4 h. p.r.n. pain.  3. Prednisone 10 mg daily x1 week, 5 mg daily x1 week, then 5 mg every      other day x1 week.  4. Lisinopril 10 mg p.o. daily.  5. Paxil 40 mg p.o. daily.  6. Xanax 0.25 mg p.r.n.   DISPOSITION AND FOLLOWUP:  The patient will be discharged home to follow  up with Dr. Johney Maine of [facility] in 2 weeks.  The patient  is also to follow up with Dr. Carlean Purl of [facility] gastroenterology on  August 23, 2007 at 3:00 p.m. The patient is also to follow up with  PCP/nurse practitioner Luanne Bras in 2-3 weeks.  A repeat urinalysis  needs to be checked for resolution of UTI.  The patient's blood pressure  will also need to be reassessed then.   CONSULTATIONS:  1. A gastroenterology consult was done, the patient was seen by      [facility] gastroenterology and  Dr. Henrene Pastor on August 05, 2007.  2. The patient was also seen by Dr. Carlean Purl on August 11, 2007.  3. [facility] was consulted, and the patient was seen by      Dr. Brantley Stage, on August 05, 2007.   PROCEDURES PERFORMED:  1. Exploratory laparotomy was performed on August 05, 2007 with      ileocecectomy with primary anastomosis  2. A CT of the abdomen and pelvis was performed on August 05, 2007      which showed chronic, but severe stricture of the ascending colon      with active inflammation surrounding this level, bowel wall      thickening involving the cecum, and marked dilatation of the distal      terminal ileum.  The appearance, most compatible, with acute-on-      chronic inflammatory bowel disease and obstruction appearance      suggesting contained colonic perforation, and  a developing      fistulous tract from the ascending colon, other chronic small bowel      inflammatory changes, and chronic diffuse colonic wall thickening      which may represent additional inflammatory involvement,      progression of hepatic steatosis, stable pelvis with suspected      chronic inflammatory wall thickening of the distal colon.   BRIEF ADMISSION HISTORY AND PHYSICAL:  [name] is a 89-year-  old white male with past medical history of obesity and Crohn's disease,  who had been recently started back on Remicade to treat his Crohn's for  which he has continued to have problems with.  He started the late  afternoon hours of August 04, 2007 having abdominal pain, mostly in  the right upper quadrant, but with some radiation to the rest of the  abdomen.  When the patient could not take it anymore, he came to the  emergency room.  He was noted to be slightly tachycardiac.  Labs were  drawn, and the patient was found to have a white count of 16.6 and had a  97% shift.  Otherwise he was notable for an incidental urinary tract  infection.  CT scan of the abdomen and  pelvis noted acute-on-chronic  inflammatory findings, severe stricture of the ascending colon,  subsequent obstruction of the cecum and terminal ileum, and suspicious  containing bowel colon perforation.  They also noted some addition of  some small-bowel mesentery inflammation compatible with Crohn's, and  also noted some incidental progression of hepatic steatosis.  With these  findings, the ER contacted [facility], who after reviewing  the films and the patient's history, felt actually that surgical consult  was not needed; and that the patient be able to manage with GI  intervention.  They, however, requested that the patient be admitted to  the hospitalist service, and they would consult.   Currently the patient received some medication for pain which he said  briefly helped his abdominal pain.  He says, however that it was coming  back.  He also complained of headache.  He denied any visual changes, no  dysphagia, no chest pain, no palpitations, no shortness of breath,  wheezing, or coughing.  He complained of abdominal pain mostly, again,  in the right area described as sharp and crampy, but diffusing into the  rest of the abdomen.  The patient denied any hematuria, dysuria,  constipation, diarrhea, focal extremity numbness, weakness or pain.  Review of systems was otherwise negative.   PHYSICAL EXAM:  VITAL SIGNS:  Per admitting physician temperature of  99.6, blood pressure 123/76, heart rate 112, respirations 20, saturating  96% on room air.  GENERAL: The patient was alert and oriented x3 in some moderate distress  secondary to abdominal pain.  HEENT: Normocephalic, atraumatic.  Mucous membranes dry.  NECK:  No carotid bruits.  Neck was supple, no lymphadenopathy.  CARDIOVASCULAR:  Regular rate and rhythm.  RESPIRATORY:  Decreased inspiratory effort secondary to abdominal pain,  but clear to auscultation bilaterally.  ABDOMEN:  Soft with some diffuse tenderness  even with minor  auscultation.  Hypoactive bowel sounds.  EXTREMITIES: No clubbing, cyanosis, or edema.   ADMISSION LABS:  Urinalysis showed moderate leukocyte esterase, 11-20  white cells, many bacteria.  Sodium 136, potassium 3.6, chloride 103,  bicarb 27, BUN 13, creatinine 1, glucose 112, CT scan as stated above.   HOSPITAL COURSE:  Problem #1: Crohn's flare with severe colonic  stricture and contained colonic perforation status post exploratory  laparotomy and ileocecectomy with primary anastomosis.  The patient was  admitted, was made n.p.o., was placed on a PPI and IV fluid, and was  given pain medication for pain control and nausea control as well.  The  patient was placed on IV antibiotics of Cipro and Flagyl.  Both  gastroenterologists consulted on the patient, and so did general surgery  consult on the patient.  The patient was taken to the operating room  were exploratory  laparotomy any via cystectomy with primary anastomosis  was performed.  The patient remained stable during the hospitalization.  The patient's diet was advanced slowly.  The patient tolerated the diet.  The patient had a few episodes of nausea, but no vomiting.  The patient  started to have some positive flatus and also was started to have some  bowel movements as well.  The patient remained stable throughout the  hospitalization, and her by day of discharge the patient was in stable  and improved condition.  The patient was also initially placed on IV  Solu-Medrol which was then tapered down to oral prednisone.  The patient  was seen, again, by his gastroenterologist Dr. Carlean Purl who had  recommended that the patient's steroids be tapered off and the patient  followup as an outpatient with him, for possible start of exanthrope  with the other patient.  The patient remained stable and improved, and  by day of discharge the patient was in stable and improved condition to  follow up with the  gastroenterologist and general surgeon.   Problem #2:  Postop ileus.  This occurred postoperatively, but improved  quickly.  The patient was able to tolerate oral intake.  The patient's  diet was advanced.  The patient started passing gas, and started having  bowel movements.  By the day of discharge the patient's postop ileus had  resolved.   Problem #3:  E. coli UTI.  This was noted incidentally on a urinalysis.  The patient initially was on IV Cipro which covered for his a urinary  tract infection.  The antibiotics were then discontinued and changed.  Urine cultures came back, and the patient was placed on Bactrim DS which  the urine culture was sensitive to.  The patient will be discharged home  to complete 1 more week of antibiotics with Bactrim DS to complete a 2-  week course of antibiotic therapy.  These will need to be followed up  per PCP/nurse practitioner for resolution of a urinary tract infection.  The rest of the patient's chronic medical issues were stable throughout  the hospitalization, and on day of discharge the patient was in stable  and improved condition.   Vital signs on day of discharge temperature 97.4, pulse of 62, blood  pressure 139/83, respiratory rate 80, saturating 97% on room air.  Discharge labs sodium 137, potassium 4.3, chloride 102, bicarb 27, BUN  8, creatinine 0.89 and glucose of 116, calcium of 8.8.  CBC white count  9.8, hemoglobin 13.6, platelets 329, and hematocrit 40.9.  The patient  will be discharged in stable and improved condition to home with  followup as stated and scheduled above.   Add to disposition and follow up that the patient's hepatic steatosis  will need to be followed up as an outpatient.  It has been a pleasure  taking care of [name].      Irine Seal, MD  Electronically Signed  DT/MEDQ  D:  08/13/2007  T:  08/14/2007  Job:  [id number]   cc:   Gatha Mayer, MD,FACG  Adin Hector, MD

## 2010-11-04 NOTE — H&P (Signed)
[name] [name], [name] [name]               ACCOUNT NO.:  [id number]   MEDICAL RECORD NO.:  [id number]          PATIENT TYPE:  EMS   LOCATION:  MAJO                         FACILITY:  [facility]   PHYSICIAN:  Annita Brod, M.D.DATE OF BIRTH:  05-02-1976   DATE OF ADMISSION:  08/05/2007  DATE OF DISCHARGE:                              HISTORY & PHYSICAL   PRIMARY CARE PHYSICIAN:  Luanne Bras, NP at [facility].   CONSULTATIONS:  Gatha Mayer, MD,FACG, [facility].   CHIEF COMPLAINT:  Abdominal pain.   HISTORY OF PRESENT ILLNESS:  The patient is a 35 year old white male  with past medical history of obesity and Crohn's disease who has been  recently started back on Remicade to help treat his Crohn's of which he  has continued to have problems with.  He started the late afternoon  hours of August 04, 2007, started having abdominal pain, mostly in the  right upper quadrant but with some radiation to the rest of the abdomen.  When the patient could not take anymore, he came into the emergency  room.  He was noted to be slightly tachycardic, and labs were drawn on  the patient.  He was found to have a white count of 16.6 and 97% shift.  Otherwise, he was notable for an incidental UTI.  A CT scan of the  abdomen and pelvis noted acute on chronic inflammatory findings.  Severe  stricture of the ascending colon, subsequent obstruction of the cecum  and terminal ileum and suspicious containing bowel colon preparation.  They also noted some addition of some small bowel mesentery  inflammation, all compatible with Crohn's.  They also noted some  incidental progression of hepatic steatosis.  With these findings, the  ER contacted [facility] who, after reviewing the films and the patient's  history, felt actually that surgical consult was not needed and that  they should be able to manage with GI intervention. They, however,  requested that the patient be admitted to the hospitalist  service and  that they would consult.  Currently, the patient received some  medication for pain which he said briefly helped his abdominal pain.  He  says, however, it is coming back.  He also complains of a headache.  He  denies any vision changes, no dysphagia, no chest pain or palpitations.  No shortness of breath, wheezing or coughing.  He complains of abdominal  pain, mostly again in the right area, described as sharp and crampy, but  diffusing to the rest of the abdomen.  He denies any hematuria, dysuria,  constipation, diarrhea, focal extremity numbness, weakness or pain.  Review of systems otherwise negative.   PAST MEDICAL HISTORY:  1. Crohn's disease.  2. Obesity.   MEDICATIONS:  1. Prednisone 10.  2. Paxil.  3. Lisinopril.  4. Xanax.  5. Remicade.   ALLERGIES:  No known drug allergies.   SOCIAL HISTORY:  Denies any tobacco, alcohol or drug use.   FAMILY HISTORY:  Noncontributory.   PHYSICAL EXAMINATION:  VITAL SIGNS:  On admission, heart rate 112, blood  pressure 123/76, temperature 99.6,  respirations 20, O2 saturation 96% on  room air.  GENERAL:  Alert and oriented x3 in some moderate distress secondary to  pain.  HEENT: Normocephalic, atraumatic.  Mucous membranes dry. No carotid  bruits.  HEART:  Regular rate and rhythm.  S1, S2.  LUNGS:  Decreased inspiratory efforts secondary to abdominal pain,  but  clear to auscultation  bilaterally.  ABDOMEN:  Soft with some diffuse tenderness, even with minor  auscultation.  Hypoactive bowel sounds.  EXTREMITIES:  No cyanosis, clubbing or edema.   LABORATORY DATA:  Urinalysis, moderate leukocyte esterase, 11-20 white  cells, many bacteria.  Sodium 136, potassium 3.6, chloride 103,  bicarbonate 27, BUN 13, creatinine 1, glucose 112.  CT scan as per HPI.   ASSESSMENT/PLAN:  1. Colonic obstruction secondary to stricture with contained      perforation.  GI will see.  N.p.o., PPI.  Pain and nausea control      plus IV  antibiotics.  2. Crohn's disease causing #1.  Continue prednisone.  Change to Solu-      Medrol.  3. UTI.  IV Cipro.  4. Hepatic steatosis.  Recommending outpatient weigh loss.      Annita Brod, M.D.  Electronically Signed     SKK/MEDQ  D:  08/05/2007  T:  08/05/2007  Job:  [id number]   cc:   Berlinda Last, NP  Gatha Mayer, MD,FACG

## 2010-11-04 NOTE — Consult Note (Signed)
NAMEMEGAN, HAYDUK               ACCOUNT NO.:  [id number]   MEDICAL RECORD NO.:  [id number]          PATIENT TYPE:  INP   LOCATION:  5156                         FACILITY:  [facility]   PHYSICIAN:  Docia Chuck. Henrene Pastor, MD      DATE OF BIRTH:  [birth year]   DATE OF CONSULTATION:  08/05/2007  DATE OF DISCHARGE:                                 CONSULTATION   REASON FOR CONSULTATION:  Abdominal pain and abnormal CT findings.   HISTORY OF PRESENT ILLNESS:  Mr. Dube is a 35 year old white male.  He  has a history of Crohn's ileitis that was diagnosed in [place] where  he had previously had colonoscopy.  The patient was admitted in late  April 2008 for a few days with a self-limiting small-bowel obstruction.  CT scan showed thickening in the terminal ileum, cecum, and ascending  colon, compatible with a Crohn's flare and associated partial small-  bowel obstruction.  There was consideration for using Remicade, but he  did not have health insurance.  Discharge medications from that April  hospitalization were prednisone taper, Paxil, and a brief course of  ciprofloxacin for a UTI at that time.  The patient did not follow up in  the gastroenterology office until July 20, 2007.  Dr. Celesta Aver note  says that the patient has a history of perirenal fistula.  A CT  enteropathy of July 29, 2006, had shown disease in the terminal  ileum.  He had briefly been on Remicade for a few treatments, but,  again, he had to stop this due to insurance issues.  The patient has  recently been treated with prednisone but unable to get below 10 mg of  prednisone without recurrent symptoms of abdominal pain.  The patient  recently was accepted into the [facility] program.  The patient was  reinitiated on Remicade and has received two treatments so far, the last  one being on February 3,2009.  He was doing fairly well, but on the day  of this admission he developed acute pain in his lower abdomen, not the  typical  location for his Crohn's which he describes as being in the  upper abdomen.  He was nauseated but did not throw up.  Pain was quite  severe in nature.Marland Kitchen  He came to the ER were his white blood cell count  was 16.6, and a CT scan showed acute on chronic inflammatory changes  with severe stricturing in the descending colon with subsequent  obstruction at the cecum and terminal ileum.  Findings were also  suspicious for contained colon perforation.  Small-bowel mesentery  inflammation was similar to prior CT scans and consistent with Crohn's  disease.  The patient was admitted to the [facility] hospitalist service, as  his primary care doctor is an [place].  [facility] was asked to  see the patient regarding change in clinical status and CT findings.   PAST MEDICAL HISTORY:  1. Crohn's ileitis at diagnosed several years ago with history of      fistulizing disease.  Has been on and off Remicade both in  [place] as well as in [place], [place].  Last      colonoscopy was in [place]. I do not have the records available      for review.  2. Obesity.  3. Fatty liver,  4. Sleep apnea, has been treated with CPAP.  5. Anxiety.  6. Hypertension.   ALLERGIES:  No known drug allergies.   MEDICATIONS ON ADMISSION:  1. Lisinopril 10 mg once daily.  2. Paxil 20 mg two p.o. daily.  3. Prednisone 10 mg daily.  4. Xanax 0.25 mg p.r.n.  5. Remicade, received 2 infusions so far, the latest being February 3.   CURRENT HOSPITAL MEDICATIONS:  1. Ciprofloxacin 400 mg IV twice daily.  2. Morphine p.r.n.  3. Solu-Medrol 10 mg IV daily.  4. Zofran p.r.n.  5. Protonix 40 mg IV once daily.   SOCIAL HISTORY:  Te patient lives in [place].  He is married.  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  He does  not smoke, does not use alcohol.   FAMILY HISTORY:  Noncontributory.   LABORATORY DATA:  White blood cell count 16,600, hemoglobin 14.9,  hematocrit  43.5, MCV 89.1, platelets 224,000.  Differential shows a left  shift present with 87% neutrophils.  Urinalysis is positive for  nitrites, moderate leukocyte esterase, 11-20 white blood cells, 0-2 red  blood cells, and many bacteria.  There is no urine culture.  Sodium 138,  potassium 3.4, chloride 102,  glucose 92, BUN 19, creatinine 0.9.   Abdominal and pelvic CT as described above.   PHYSICAL EXAMINATION:  GENERAL:  The patient is an obese white male,  does not appear toxic, looks a bit uncomfortable.  VITAL SIGNS:  Temperature 98, pulse 97,  respirations 18.  Blood  pressure 111/65, room air saturation 91%.  HEENT:  Sclerae nonicteric.  Conjunctivae pink.  Extraocular movements  are intact.  Oropharynx is smooth and moist.  Dentition in good repair.  NECK:  No JVD, no masses, no bruits.  CHEST:  Clear to auscultation and percussion bilaterally.  No shortness  of breath.  No cough.  CARDIOVASCULAR:  Regular rate and rhythm with S1-S2 audible.  No  murmurs, rubs or gallops.  ABDOMEN:  Obese, soft with marked tenderness in the suprapubic area and  right mid abdomen.  It hurts the patient to move from a sitting position  in the bed to a lying position.  The abdominal pain is worsened by  flexion of the leg or the hip.  There is guarding and mild rebound.  Bowel sounds are somewhat diminished.  RECTAL AND GU:  Exams were deferred.  EXTREMITIES:  No cyanosis, clubbing or edema.  NEUROLOGIC:  The patient is alert and oriented x3.  He moves all fours  albeit with some pain of the abdomen when he moves his legs.  There is  no tremor.  PSYCHIATRIC:  The patient does not appear depressed.   IMPRESSION:  1. Complicated Crohn's ileitis and right colonic Crohn's disease, now      with evidence for stricturing, infection, and localized      perforation. There are peritoneal signs in this patient on      steroids.  Dr. Henrene Pastor has fully evaluated the patient and deems      urgent surgical  intervention appropriate. He has spoken to Dr.      Erroll Luna (general surgeon) who will be seeing the patient.      Pain management has  been adequate so far with the use of available      morphine.  Next, Solu-Medrol dose is a bit low, and will increase      this dose to 30 mg IV daily for stress dose coverage.  2. History of fatty liver and obesity.   PLAN:  Continue to keep the patient n.p.o. and support with IV fluids.  Continue the intrveneous antibiotics of Ciprofloxacin and metronidazole  as already started. Stress dose steroids. Urgent surgical evaluation.      Azucena Freed, [name]      Docia Chuck. Henrene Pastor, MD  Electronically Signed    SG/MEDQ  D:  08/05/2007  T:  08/07/2007  Job:  [id number]   cc:   Gatha Mayer, MD,FACG  Luanne Bras, N.P., [facility]

## 2010-11-04 NOTE — Assessment & Plan Note (Signed)
[facility]                                 ON-CALL NOTE   [name], [name]                        MRN:          [id number]  DATE:10/31/2007                            DOB:          1975/07/31    Mr. Hume called stating that he is having urinary frequency.  He is on  Imuran for Crohn's disease.  He has inquired whether or not this is from  his medicine.  I stated that I think it is unlikely that this is from  Imuran.  He has what sounds like fistulas and Crohn's disease.  I told  him to call the office tomorrow so he can speak with Dr. Carlean Purl or see  him because of his symptoms.  A urinary tract infection is a  possibility.     Sandy Salaam. Deatra Ina, MD,FACG  Electronically Signed    RDK/MedQ  DD: 10/31/2007  DT: 10/31/2007  Job #: [id number]   cc:   Gatha Mayer, MD,FACG

## 2010-11-04 NOTE — Consult Note (Signed)
NAMEJENSYN, CAMBRIA               ACCOUNT NO.:  [id number]   MEDICAL RECORD NO.:  [id number]          PATIENT TYPE:  INP   LOCATION:  5156                         FACILITY:  [facility]   PHYSICIAN:  Gatha Mayer, MD,FACGDATE OF BIRTH:  05/29/1976   DATE OF CONSULTATION:  08/11/2007  DATE OF DISCHARGE:                                 CONSULTATION   Mr. Dorris is a patient of mine with Crohn's of the ileum and colon.  He  was admitted after he had an abscess and perforation, and Dr. Brantley Stage  performed a resection of the ileum and right colon.  He is much better  though he has had an ileus.  He is weaning on his prednisone.   Prior to his admission, he was on prednisone and Remicade.  He had had  intermittent therapy due to insurance issues.  At this point, it sounds  like most, if not all, of his active Crohn's disease was resected, or  least the significantly active disease.   I plan to see him 3-4 weeks after discharge.  I have asked that he be  tapered off his prednisone at discharge.  I am going to suspend his  Remicade therapy, as is not clear we should continue that, but I think  we will probably start azathioprine in him as a way to prevent  recurrence of his Crohn's disease.  He could come to require Remicade or  another anti-tumor necrosis factor agent in the future, and but unless  his restaging colonoscopy 2 months after his operation shows significant  problems, will likely stick with azathioprine for the time being.  I  have explained this to him and he understands.   I have also explained his bowel habits will be different after a right  hemicolectomy and loss of the ileocecal valve and that he could have  some diarrhea that is not necessarily related to his Crohn's.      Gatha Mayer, MD,FACG  Electronically Signed     CEG/MEDQ  D:  08/11/2007  T:  08/12/2007  Job:  [id number]

## 2010-11-04 NOTE — Assessment & Plan Note (Signed)
[facility]                         GASTROENTEROLOGY OFFICE NOTE   [name] [name], [name] [name]                      MRN:          [id number]  DATE:09/02/2007                            DOB:          29-Sep-1975    CHIEF COMPLAINT:  Followup of Crohn's disease after resection.   Discharge summary, consult note and H&P reviewed.  The patient had a  Crohn's flare with colonic stricture and a contained microperforation.  He had an ileocecectomy and primary anastomosis performed by Dr. Johney Maine.  He had postoperative ileus and eventually recovered.  He is now one  month out.  He has had some trouble with an abscess along his wound.  He  was originally on some Augmentin that caused some diarrhea.  That was  discontinued three days ago and the diarrhea is somewhat improved.  His  midline area is tender in the area of this abscess, or wound infection.  He has a wick in there and is continuing to follow up with Dr. Johney Maine.  We have suspended his Remicade at this time.  He is on no specific  Crohn's therapy and he has tapered off his prednisone completely.  He  remains on lisinopril and multivitamin.  He has used Xanax and Aleve  p.r.n.   There are no known drug allergies.   PHYSICAL EXAM:  Weight 238 pounds, pulse 72, blood pressure 122/70.  There is a midline wound in the abdomen that is well-healed from  surgery.  About two-thirds of the way down, there is a small open area  that is packed and I can see a gauze wick in there.  Up above, on the  left side, he has a several-centimeter area of firmness and tenderness  that is probably this wick plus or minus any other inflammation.  There  is no erythema or other significant surrounding skin change.   ASSESSMENT:  Crohn's ileocolitis, history of fistulizing disease,  previously on Remicade, was also on prednisone.  Recent ileocecectomy  with primary anastomosis for contained abscess and stricture.   PLAN:   Colonoscopy in four to six weeks to reassess.  At that point, we  will determine the best therapy.  I am leaning towards azathioprine and  not Remicade.  In the meantime, cancel Remicade.     Gatha Mayer, MD,FACG  Electronically Signed    CEG/MedQ  DD: 09/02/2007  DT: 09/02/2007  Job #: [id number]   cc:   Adin Hector, MD  Luanne Bras, M.D.

## 2010-11-05 ENCOUNTER — Telehealth: Payer: Self-pay | Admitting: Pulmonary Disease

## 2010-11-05 NOTE — Telephone Encounter (Signed)
Forwarded to Dr. Halford Chessman for review.

## 2010-11-07 ENCOUNTER — Telehealth: Payer: Self-pay

## 2010-11-07 NOTE — Assessment & Plan Note (Signed)
[facility] OFFICE NOTE   [name] [name], [name] [name]                      MRN:          [id number]  DATE:07/27/2006                            DOB:          12-Nov-1975    CHIEF COMPLAINT:  Followup of Crohn's disease flare.   HISTORY:  I have not seen [name] [name] in a year.  At that time we were  going to go on Remicade after Humira turned out to be too expensive.  However, due to cost issues he basically stopped taking all of his  therapy and the only medication he is on now is Paxil 40 mg daily.  About a month ago he developed nausea and vomiting problems, felt like  he was having a flare of his Crohn's.  He took a few days of prednisone  that he had on-hand and things are better, though he is still having  five or seven loose bowel movements a day.  He is not describing  bleeding.  He is still has some mucopurulent discharge from a fistula at  his anal area.  Denies fever or chills at this time.  There are no  urinary symptoms.   DRUG ALLERGIES:  None known.   PAST MEDICAL HISTORY:  Crohn's disease.  In the past he has been on  Remicade but it turns out he was started on Imuran when he started  having flares as his Remicade infusions went out beyond 8 weeks.  He  also has history of depression and nephrolithiasis and sleep apnea using  CPAP at night, and he has had surgery for hypospadia.   SOCIAL HISTORY, FAMILY HISTORY:  Reviewed and unchanged.   Weight 227 pounds, pulse 78, blood pressure 132/86.  Eyes anicteric,  lungs clear.  Heart S1, S2; no murmurs or gallop.  Abdomen shows striae,  soft, mild to moderate tenderness in the right lower quadrant without  organomegaly or mass.  Inspection of the rectum shows a small fistulous  tract, erythema, and pinhole-sized opening at about 9 o'clock in the  left lateral decubitus position.  There is no fluctuance, there is no  underlying mass.  The rectal exam is  tender.  He has some mild anal  stenosis but I do not detect any stricture or problem.   ASSESSMENT:  Crohn's disease, presumably small-bowel Crohn's disease  with a perianal fistula.  I never did receive a colonoscopy report as we  requested from [place].  I am not sure as to the exact extent of his  disease.   PLAN:  1. Initiate prednisone 30 mg daily.  2. Cipro 500 mg b.i.d. for 14 days.  3. CBC with diff, CMET, TSH, C-reactive protein.  4. Will refer him for Remicade, with Dr. Justine Null most likely, as the      hospital is very costly.  5. CT enterography to stage his Crohn's disease.  6. May need a colonoscopy.   Await the staging information prior to re-initiating Remicade.  Will  probably go with monotherapy rather than Remicade plus immunomodulator,  based upon current understanding of these diseases and his previous  response to Remicade.  We did review the potential toxicities of  lymphoma that are thought to potentially be increased on immunomodulator  plus Remicade therapy.     Gatha Mayer, MD,FACG  Electronically Signed    CEG/MedQ  DD: 07/27/2006  DT: 07/27/2006  Job #: [id number]   cc:   [name] [name], M.D.

## 2010-11-07 NOTE — Discharge Summary (Signed)
[name] [name], [name] [name]               ACCOUNT NO.:  [id number]   MEDICAL RECORD NO.:  [id number]          PATIENT TYPE:  INP   LOCATION:  5524                         FACILITY:  [facility]   PHYSICIAN:  Helen Hashimoto, MD    DATE OF BIRTH:  [birth year]   DATE OF ADMISSION:  10/19/2006  DATE OF DISCHARGE:  10/22/2006                               DISCHARGE SUMMARY   PRIMARY PHYSICIAN:  Girard Cooter, M.D.   DISCHARGE DIAGNOSES:  1. Crohn's disease.  2. Small bowel obstruction secondary to Crohn's disease.  3. Constipation.  4. Urinary tract infection.  5. Depression.  6. Obesity.   DISCHARGE MEDICATIONS:  1. Prednisone tapering dose.  2. Paxil 40 mg p.o. once daily.  3. Ciprofloxacin 500 mg p.o. q.12h. x3 days   CONSULTATIONS:  None.   PROCEDURES:  None.   FOLLOW-UP APPOINTMENTS:  Dr. Girard Cooter in 1 week.   CONDITION AT THE TIME OF DISCHARGE:  The patient is stable.   RADIOLOGY STUDIES:  1. CT scan of the abdomen and pelvis done October 20, 2006 showed      findings compatible with Crohn's exacerbation and partial small-      bowel obstruction.  2. Abdominal x-ray done October 20, 2006 showed dilated midabdominal      small bowel consistent with partial small-bowel obstruction.  3. Repeat abdominal x-ray done in May1, 2008 showed improvement in      the small bowel distention.   COURSE OF HOSPITALIZATION:  1. Crohn's disease flare.  This patient was admitted to the hospital.      Initially he was kept n.p.o. and diet was advanced to liquid to      soft until the patient went to regular diet.  Currently he is      taking regular diet without any problems.  He was also started on      Solu-Medrol in the hospital and that was switched to prednisone in      tapering dose.  2. Urinary tract infection.  His urine is showing a borderline UTI      with no cultures of his urine.  Patient is already receiving Cipro      as part of his treatment for Crohn's and that should cover for his   urine infection.  3. Depression.  The patient was continued on Paxil.  4. Obesity.  The patient was advised about the risk.   Assessment time is 40 minutes.      Helen Hashimoto, MD  Electronically Signed     NAE/MEDQ  D:  10/22/2006  T:  10/22/2006  Job:  [id number]   cc:   Girard Cooter, MD

## 2010-11-07 NOTE — H&P (Signed)
[name] [name], [name] [name]               ACCOUNT NO.:  [id number]   MEDICAL RECORD NO.:  [id number]          PATIENT TYPE:  INP   LOCATION:  6712                         FACILITY:  [facility]   PHYSICIAN:  Annita Brod, M.D.DATE OF BIRTH:  12/15/1975   DATE OF ADMISSION:  10/19/2006  DATE OF DISCHARGE:                              HISTORY & PHYSICAL   PATIENT'S PRIMARY CARE Alexsa Flaum:  He goes to [facility].   CHIEF COMPLAINT:  Abdominal pain.   HISTORY OF PRESENT ILLNESS:  The patient is a 35 year old white male  with past medical history of Crohn's disease and previous history of  small bowel obstruction who presents with complaints of the same.  He  says he has been previously well, but for the last several days he has  had worsening abdominal pain.  When his Crohn's disease flares up, he  starts a prednisone taper, but this time he started having abdominal  distention and could not keep anything down and came into the emergency  room.  The patient had lab work done and was found to have a white count  of 7.5 with a 78% shift.  The rest of his labs are noted for a small  UTI, but otherwise, normal labs.  The patient had an acute abdominal  series showing dilated mid abdominal small bowel loops continuing over  his pressure small bowel obstructing which CT scan of the abdomen with  contrast showed wall thickening and insulation involving the terminal  ileum, cecum and descending colon compatible with Crohn's' exacerbation  associated with prominent small bowel loops suggesting partial small  bowel obstruction.  With these findings, it was felt best the patient  come into the emergency room.  Currently, he has received pain and  nausea medication and is feeling sleepy but his pain is stable.  He  denied any headaches, vision changes, dysphagia, chest pain,  palpitations, shortness of breath, wheezing, cough.  Currently no  abdominal pain.  No hematuria, dysuria,  constipation, diarrhea, focal  extremity numbness, weakness or pain.  His review of systems otherwise  negative.   PAST MEDICAL HISTORY:  1. Crohn's disease.  2. Previous history of bowel obstruction secondary to Crohn's.  3. History of depression.   MEDICATIONS:  1. Paxil.  2. Prednisone p.r.n.   ALLERGIES:  No known drug allergies.   SOCIAL HISTORY:  Denies any tobacco, alcohol or drug use.   FAMILY HISTORY:  Noncontributory.   PHYSICAL EXAMINATION:  VITAL SIGNS:  Temperature 98.6, heart rate 66,  blood pressure 130/79, respirations 18, O2 saturations 96% on room air.  GENERAL:  The patient is alert and oriented x3.  No apparent distress.  HEENT:  Normocephalic, atraumatic.  Mucous membranes slightly dry.  No  carotid bruits.  HEART:  Regular rhythm, S1, S2.  LUNGS:  Clear to auscultation bilaterally.  ABDOMEN:  Soft, distended, currently nontender, scant bowel sounds.  EXTREMITIES:  No cyanosis, clubbing or edema.   LABORATORY DATA:  White count 7.5, H&H 15.6 and 46.5, MCV 86, platelets  254, 78% neutrophils.  UA shows a small leukocyte esterase,  nitrite  negative but with 11-20 white cells.  Lipase was 29.  LFTs are  unremarkable.  __________ albumin. Sodium 136, potassium 4.1, chloride  107, bicarbonate 24, BUN 13, __________, creatinine 0.7, glucose  __________.   ASSESSMENT/PLAN:  1. Small bowel obstruction, make the patient n.p.o.  Pain and nausea      control and check a repeat abdominal series on May 1.  2. Crohn's disease exacerbation causing #1.  Will put the patient on      IV Solu-Medrol instead of p.o. prednisone.  Because of his bowel      obstruction, might be able to go with mesalamine or sulfasalazine      at this time because of inability to take p.o.  3. Urinary tract infection.  Will give IV Cipro.  4. Depression.  Hold on his Paxil.      Annita Brod, M.D.  Electronically Signed     SKK/MEDQ  D:  10/20/2006  T:  10/20/2006  Job:   [id number]   cc:   Girard Cooter, MD  Gatha Mayer, MD,FACG

## 2010-11-07 NOTE — Telephone Encounter (Signed)
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

## 2010-11-07 NOTE — Telephone Encounter (Signed)
Left message for patient to call back  

## 2010-11-07 NOTE — Consult Note (Signed)
NAMECARLYN, MULLENBACH               ACCOUNT NO.:  [id number]   MEDICAL RECORD NO.:  [id number]          PATIENT TYPE:  INP   LOCATION:  5524                         FACILITY:  [facility]   PHYSICIAN:  Gwenyth Ober, M.D.    DATE OF BIRTH:  08-15-1975   DATE OF CONSULTATION:  DATE OF DISCHARGE:                                 CONSULTATION   Dear Dr. Hartford Poli, thank you for asking me to see Mr. Buzby, 35 year old,  who on abdominal films today showed what we thought was an early partial  small-bowel obstruction.   The patient has a history of Crohn's disease and previous bowel  obstruction secondary to Crohn's disease.  He is being followed by Dr.  Carlean Purl for his disease.  He has been maintained on Remicade for his  Crohn's disease and occasionally takes steroids, prednisone.  He is  currently starting himself on 40 mg a day after he thought he felt an  onset of symptoms.  His major problem he has not had a bowel movement  for 6 days but he has had no nausea, vomiting.  No fever or chills.  His  abdomen does not appear to be very bloated.   PAST MEDICAL HISTORY:  Significant for the Crohn's disease.  He has not  had surgery for this problem before.  He had a single view x-ray which  showed gas in the colon but some dilated small-bowel loops suspicion  right upper quadrant.  Surgical consultation was obtained.   On my examination of the patient today, he is afebrile.  His other vital  signs are stable.  On abdominal exam, he has normoactive bowel sounds.  He has some right and left lower quadrant abdominal tenderness and some  epigastric discomfort but no peritonitis.   X-rays demonstrate some small bowel loops in right upper quadrant which  are mildly dilated with gas in the colon.  He has had no nausea,  vomiting.  There is no free air on his x-rays and he has no peritonitis.   My impression is that he has got some mild sort of recurrence of his  Crohn's disease and this should be  treated medically.  The patient does  not require an NG tube; however, if he is having problems, he should be  n.p.o. and maybe considered starting on TNA until his symptoms have  completely resolved.  No need for operation or surgical intervention at  this time.      Gwenyth Ober, M.D.  Electronically Signed     JOW/MEDQ  D:  10/23/2006  T:  10/23/2006  Job:  [id number]

## 2010-11-07 NOTE — Telephone Encounter (Signed)
He has not returned back to work.  He would like to go back on Tues 11/17/10.  He has one more week on Valcyte and makes him shake and would like to wait to return until he finishes the drug.

## 2010-11-11 ENCOUNTER — Telehealth: Payer: Self-pay | Admitting: Internal Medicine

## 2010-11-11 NOTE — Telephone Encounter (Signed)
I spoke with the patient and advised him paperwork is on Dr Celesta Aver desk and we will call him as soon as it is ready.

## 2010-11-12 NOTE — Telephone Encounter (Signed)
Per Dr Carlean Purl patient needs an office visit prior to returning to work.

## 2010-11-12 NOTE — Telephone Encounter (Signed)
Ok - He needs to be seen by me or an extender next week for follow-up Once we have that date fill it in on form for next appt and we can send

## 2010-11-13 ENCOUNTER — Telehealth: Payer: Self-pay | Admitting: Internal Medicine

## 2010-11-13 ENCOUNTER — Other Ambulatory Visit (INDEPENDENT_AMBULATORY_CARE_PROVIDER_SITE_OTHER): Payer: 59

## 2010-11-13 ENCOUNTER — Encounter: Payer: Self-pay | Admitting: Nurse Practitioner

## 2010-11-13 ENCOUNTER — Ambulatory Visit (INDEPENDENT_AMBULATORY_CARE_PROVIDER_SITE_OTHER): Payer: 59 | Admitting: Nurse Practitioner

## 2010-11-13 DIAGNOSIS — K5080 Crohn's disease of both small and large intestine without complications: Secondary | ICD-10-CM

## 2010-11-13 DIAGNOSIS — B259 Cytomegaloviral disease, unspecified: Secondary | ICD-10-CM

## 2010-11-13 DIAGNOSIS — R7989 Other specified abnormal findings of blood chemistry: Secondary | ICD-10-CM | POA: Insufficient documentation

## 2010-11-13 LAB — HEPATIC FUNCTION PANEL
Albumin: 3.7 g/dL (ref 3.5–5.2)
Total Protein: 7.3 g/dL (ref 6.0–8.3)

## 2010-11-13 LAB — CBC WITH DIFFERENTIAL/PLATELET
Basophils Absolute: 0 10*3/uL (ref 0.0–0.1)
Basophils Relative: 0.6 % (ref 0.0–3.0)
Eosinophils Absolute: 0.1 10*3/uL (ref 0.0–0.7)
HCT: 39.8 % (ref 39.0–52.0)
Hemoglobin: 13.6 g/dL (ref 13.0–17.0)
Lymphocytes Relative: 42 % (ref 12.0–46.0)
Lymphs Abs: 2.9 10*3/uL (ref 0.7–4.0)
MCHC: 34.2 g/dL (ref 30.0–36.0)
MCV: 96 fl (ref 78.0–100.0)
Monocytes Absolute: 0.4 10*3/uL (ref 0.1–1.0)
Neutro Abs: 3.5 10*3/uL (ref 1.4–7.7)
RBC: 4.14 Mil/uL — ABNORMAL LOW (ref 4.22–5.81)
RDW: 16.7 % — ABNORMAL HIGH (ref 11.5–14.6)

## 2010-11-13 NOTE — Progress Notes (Signed)
[name] [name] [id number] 1975/11/28   HISTORY OR PRESENT ILLNESS : Patient is a 35 year old white male followed by Dr. Carlean Purl for history of  Crohn's disease of large and small bowel. He was on Remicade and Azathioprine prior to being recently hospitalized with upper abdominal pain, nausea, vomiting and fevers. Work up including CTscan, U/S, and EGD were negative. There was a suspicion for CMV. HIDA remarkable for ejection fraction of 9% but symptoms were not consistent with biliary dyskinesia. Infectious Disease evaluated the patient, multiple laboratory studies were done and negative with exception of CMV. His viral load was 3,079. He was started on Valcyte which he completed yesterday. Patient's Azathioprine has been on hold. He missed his last dose of Remicade while hospitalized. His next infusion is scheduled for June 13. Patient is feeling much better as far as weakness and myalgias. His diarrhea however is" through the roof".  No fevers. Stools mostly loose, 7-8 episodes a day. No rectal bleeding. Minimal epigastric pain. Appetite great. No weight loss.  Patient has still been having problems sleeping. He is scheduled for a sleep study on June 6.  Current Medications, Allergies, Past Medical History, Past Surgical History, Family History and Social History were reviewed in [employer] record.   PHYSICAL EXAMINATION : General: Well developed white male in no acute distress Head: Normocephalic and atraumatic Eyes:  sclerae anicteric,conjunctive pink. Ears: Normal auditory acuity Mouth: No deformity or lesions Neck: Supple, no masses.  Lungs: Clear throughout to auscultation Heart: Regular rate and rhythm; no murmurs heard Abdomen: Soft, obese,nondistended, nontender. No masses or hepatomegaly noted. Normal bowel sounds Rectal: Not done Musculoskeletal: Symmetrical with no gross deformities  Skin: No lesions on visible extremities Extremities: No edema or  deformities noted Neurological: Alert oriented x 4, grossly nonfocal Cervical Nodes:  No significant cervical adenopathy Psychological:  Alert and cooperative. Normal mood and affect  ASSESSMENT AND PLAN :

## 2010-11-13 NOTE — Patient Instructions (Addendum)
Please go to the basement level to have your labs drawn.  Do not restart Imuran. We will call  you with the next appointment.  Tye Savoy ACNP is talking to Dr. Carlean Purl .

## 2010-11-13 NOTE — Telephone Encounter (Signed)
I have left a voicemail for [name] [name] from his company.  She left a voicemail for me asking to clarify exactly how many hours he can work.  Patient is allowed to work up to 8 hours next week and per Dr Carlean Purl will need more frequent breaks and he will leave it up to the patient how he feels if he want to work less for 1 week then resume normal activity and hours.

## 2010-11-13 NOTE — Assessment & Plan Note (Signed)
Transaminitis with liver function studies of 10/19/2010 revealing AST of 94 ALT 68 phosphatase of 120. Bilirubin was normal. We'll recheck LFTs today.

## 2010-11-16 ENCOUNTER — Encounter: Payer: Self-pay | Admitting: Nurse Practitioner

## 2010-11-16 NOTE — Assessment & Plan Note (Addendum)
Off Azathioprine secondary to CMV. Patient missed last Remicade infusion while hospitalized. Next infusion due 12/03/10. Having 6-7 loose stools a day. At this point, low suspicion for C-Difficile but need to keep in mind. He may use Immodium up to 21m /day as needed. Will check labs today and discuss further management with Dr. GAlphonsa Ginprimary gastroenterologist.

## 2010-11-18 ENCOUNTER — Telehealth: Payer: Self-pay

## 2010-11-18 NOTE — Progress Notes (Signed)
Note reviewed. Labs are ok except mild elevation of LFT's which we can follow-up He should go ahead with 6/13 Remicade I think and see me in 2-3 weeks after that We should also call him when he is finished with Valcyte and get a phone reassessment

## 2010-11-18 NOTE — Telephone Encounter (Signed)
Patient has completed his valcyte.  No changes in his condition.  He is advised to proceed with Remicade on 6/13.  He is scheduled for REV 12/29/10

## 2010-11-18 NOTE — Telephone Encounter (Signed)
ok 

## 2010-11-25 NOTE — Telephone Encounter (Signed)
I have left a message for the patient to call back

## 2010-11-25 NOTE — Progress Notes (Signed)
The patinet has an appointment scheduled with Dr Silvano Rusk.  Barb Merino, RN CGRN  made the appointment for the patient.

## 2010-11-25 NOTE — Telephone Encounter (Signed)
Please get a symptom update re: diarrhea especially this week If still having please have him do a C diff PCR

## 2010-11-26 ENCOUNTER — Encounter (HOSPITAL_BASED_OUTPATIENT_CLINIC_OR_DEPARTMENT_OTHER): Payer: 59

## 2010-11-28 ENCOUNTER — Telehealth: Payer: Self-pay | Admitting: Internal Medicine

## 2010-11-28 LAB — AFB CULTURE, BLOOD

## 2010-11-28 MED ORDER — INFLIXIMAB 100 MG IV SOLR: INTRAVENOUS | Status: DC

## 2010-11-28 NOTE — Telephone Encounter (Signed)
Remicade order faxed to [phone]

## 2010-11-30 ENCOUNTER — Ambulatory Visit (HOSPITAL_BASED_OUTPATIENT_CLINIC_OR_DEPARTMENT_OTHER): Payer: 59 | Attending: Pulmonary Disease

## 2010-11-30 DIAGNOSIS — F329 Major depressive disorder, single episode, unspecified: Secondary | ICD-10-CM | POA: Insufficient documentation

## 2010-11-30 DIAGNOSIS — G4733 Obstructive sleep apnea (adult) (pediatric): Secondary | ICD-10-CM | POA: Insufficient documentation

## 2010-11-30 DIAGNOSIS — F3289 Other specified depressive episodes: Secondary | ICD-10-CM | POA: Insufficient documentation

## 2010-12-01 NOTE — Telephone Encounter (Signed)
lmom for pt to call back

## 2010-12-02 ENCOUNTER — Other Ambulatory Visit (INDEPENDENT_AMBULATORY_CARE_PROVIDER_SITE_OTHER): Payer: 59

## 2010-12-02 DIAGNOSIS — K5080 Crohn's disease of both small and large intestine without complications: Secondary | ICD-10-CM

## 2010-12-02 LAB — CBC WITH DIFFERENTIAL/PLATELET
Basophils Absolute: 0 10*3/uL (ref 0.0–0.1)
Eosinophils Relative: 1.2 % (ref 0.0–5.0)
HCT: 41.3 % (ref 39.0–52.0)
Hemoglobin: 14.1 g/dL (ref 13.0–17.0)
Lymphocytes Relative: 34.2 % (ref 12.0–46.0)
Lymphs Abs: 2.5 10*3/uL (ref 0.7–4.0)
Monocytes Relative: 9.8 % (ref 3.0–12.0)
Neutro Abs: 4 10*3/uL (ref 1.4–7.7)
RBC: 4.31 Mil/uL (ref 4.22–5.81)
RDW: 16 % — ABNORMAL HIGH (ref 11.5–14.6)
WBC: 7.4 10*3/uL (ref 4.5–10.5)

## 2010-12-02 LAB — HEPATIC FUNCTION PANEL
ALT: 40 U/L (ref 0–53)
AST: 40 U/L — ABNORMAL HIGH (ref 0–37)
Albumin: 4.2 g/dL (ref 3.5–5.2)
Alkaline Phosphatase: 86 U/L (ref 39–117)

## 2010-12-02 NOTE — Telephone Encounter (Signed)
Left message for patient to call back  

## 2010-12-03 ENCOUNTER — Encounter (HOSPITAL_COMMUNITY): Payer: 59 | Attending: Internal Medicine

## 2010-12-03 DIAGNOSIS — Z79899 Other long term (current) drug therapy: Secondary | ICD-10-CM | POA: Insufficient documentation

## 2010-12-03 DIAGNOSIS — K5090 Crohn's disease, unspecified, without complications: Secondary | ICD-10-CM | POA: Insufficient documentation

## 2010-12-03 NOTE — Telephone Encounter (Signed)
Multiple calls made to the patient no return calls from the patient

## 2010-12-04 ENCOUNTER — Telehealth: Payer: Self-pay

## 2010-12-04 DIAGNOSIS — R197 Diarrhea, unspecified: Secondary | ICD-10-CM

## 2010-12-04 NOTE — Telephone Encounter (Signed)
Patient left me a voicemail about his symptoms.  He is still having diarrhea, but was hoping the Remicade infusion he was having yesterday would help.

## 2010-12-04 NOTE — Telephone Encounter (Signed)
If still having diarrhea next week he needs stool for C diff PCR - check with him monday

## 2010-12-08 NOTE — Telephone Encounter (Signed)
Patient having 8 episodes of diarrhea a day.  He will come tomorrow for c-diff.

## 2010-12-08 NOTE — Telephone Encounter (Signed)
Left message for patient to call back  

## 2010-12-09 ENCOUNTER — Encounter: Payer: Self-pay | Admitting: Internal Medicine

## 2010-12-09 ENCOUNTER — Ambulatory Visit (INDEPENDENT_AMBULATORY_CARE_PROVIDER_SITE_OTHER): Payer: 59 | Admitting: Internal Medicine

## 2010-12-09 VITALS — BP 140/100 | HR 69 | Temp 97.8°F | Wt 230.0 lb

## 2010-12-09 DIAGNOSIS — I10 Essential (primary) hypertension: Secondary | ICD-10-CM

## 2010-12-09 DIAGNOSIS — E669 Obesity, unspecified: Secondary | ICD-10-CM

## 2010-12-09 MED ORDER — LISINOPRIL 10 MG PO TABS: 10.0000 mg | ORAL_TABLET | Freq: Every day | ORAL | Status: DC

## 2010-12-09 NOTE — Patient Instructions (Signed)
Diarrhea - to be followed by Dr. Carlean Purl and company  Blood pressure - start the lisinopril. Check BP at home and report back. [email or web address]. Return to lab in one month to check renal (kidney) function to rule out adverse effect on blood flow to the kidney. No appointment or fasting needed.  Weight management - remember the golden rules: smart food choices, portion size control and regular exericse defined as a minimum of 3 sessions a week of 30 minutes with a heart rate 1.8 times resting. Goal is to loose 1-2 lbs per month.   Return for follow-up visit, assuming all goes well, in 6 months.

## 2010-12-09 NOTE — Progress Notes (Signed)
  Subjective:    Patient ID: [name] [name], male    DOB: 1976-03-14, 35 y.o.   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 IN the interval he has been seen by Dr. Halford Chessman and has a sleep study last Sunday - results pending. He has been having 6-8 loose,mucusy stools per day. GI is managing the work-up for this and he is to submit stool for C. Diff testing.   He reports that he will be very stiff in his back when he first stands up after a long time at his desk.   He has received a copy of the original intake note and has no additions or corrections.  PMH, FamHx and SocHx reviewed for any changes and relevance.   Review of Systems Review of Systems  Constitutional:  Negative for fever, chills, activity change and unexpected weight change.  HEENT:  Negative for hearing loss, ear pain, congestion, neck stiffness and postnasal drip. Negative for sore throat or swallowing problems. Negative for dental complaints.   Eyes: Negative for vision loss or change in visual acuity.  Respiratory: Negative for chest tightness and wheezing.   Cardiovascular: Negative for chest pain and palpitation. No decreased exercise tolerance Gastrointestinal: No change in bowel habit. No bloating or gas. No reflux or indigestion Genitourinary: Negative for urgency, frequency, flank pain and difficulty urinating.  Musculoskeletal: Negative for myalgias, back pain, arthralgias and gait problem.  Neurological: Negative for dizziness, tremors, weakness and headaches.  Hematological: Negative for adenopathy.  Psychiatric/Behavioral: Negative for behavioral problems and dysphoric mood.       Objective:   Physical Exam Vitals noted - weight gain noted  Wt Readings from Last 3 Encounters:  12/09/10 230 lb (104.327 kg)  11/13/10 228 lb (103.42 kg)  10/28/10 218 lb 12.8 oz (99.247 kg)  Gen'l - WNWD overweight white male Chest - clear, no increased WOB Cor - RRR       Assessment &  Plan:

## 2010-12-10 ENCOUNTER — Telehealth: Payer: Self-pay | Admitting: Pulmonary Disease

## 2010-12-10 ENCOUNTER — Encounter: Payer: Self-pay | Admitting: Pulmonary Disease

## 2010-12-10 NOTE — Procedures (Addendum)
[name], [name]               ACCOUNT NO.:  [id number]  MEDICAL RECORD NO.:  [id number]          PATIENT TYPE:  OUT  LOCATION:  SLEEP CENTER                 FACILITY[facility]  [facility]  PHYSICIAN:  Chesley Mires, MD        DATE OF BIRTH:  1976/05/04  DATE OF STUDY:  11/30/2010                           NOCTURNAL POLYSOMNOGRAM  REFERRING PHYSICIAN:  Chesley Mires, MD  INDICATION FOR STUDY:  [name] is a 35 year old male who has a history of depression, snoring, and daytime sleepiness.  He had a previous sleep study years ago who was told he had sleep apnea.  However he had not been on therapy for this.  Since that time he has not had a change in his weight.  He is therefore referred to the sleep lab for evaluation of hypersomnia with obstructive sleep apnea.  Height is 5 feet 9 inches, weight is 220 pounds.  BMI is 32.  Neck size is 16.5 inches.  EPWORTH SLEEPINESS SCORE:  17.  MEDICATIONS:  Paxil.  SLEEP ARCHITECTURE:  The patient has followed a split night study protocol.  During the diagnostic portion of the study, total recording time was 136 minutes, total sleep time was 124 minutes, sleep efficiency was 91%, sleep latency was 7 minutes.  This portion of the study was notable for the lack of stage III sleep and REM sleep and the patient slept predominately in the nonsupine position.  During the titration portion of study, total recording time was 227 minutes.  Total sleep time was 181 minutes.  Sleep efficiency was 79%. Sleep latency was 1 minute.  REM latency is 106 minutes.  This portion of the study was notable for the lack of stage III sleep.  The patient slept in both the supine and nonsupine positions.  RESPIRATORY DATA:  The average respiratory rate was 20.  Loud snoring was noted by the technician.  During the diagnostic portion of the study, the overall apnea-hypopnea index was 14.  The events were exclusively obstructive in nature.  During the titration portion of the  study, the patient was started on CPAP at 4 and increased to 13 cm of water.  With CPAP set at 12 cm of water, the apnea-hypopnea index was reduced to 5.  At this pressure setting, he was observed in REM sleep and supine sleep.  OXYGEN DATA:  The baseline oxygenation was 96%.  The oxygen saturation nadir was 87%.  The study was conducted without the use of supplemental oxygen.  CARDIAC DATA:  The average heart rate was 61 and the rhythm strip showed normal sinus rhythm with occasional PVCs.  MOVEMENT-PARASOMNIA:  The periodic limb movement index was 29 during the diagnostic portion of the study and 9 during the therapeutic portion of the study.  The patient had 1 restroom trip.  IMPRESSIONS-RECOMMENDATIONS:  This study shows evidence for moderate obstructive sleep apnea.  He had good control of his sleep disordered breathing with CPAP set at 12 cm of water.  In addition to diet, exercise, and weight reduction, I would recommend the patient to be started on CPAP at 12 cm of water and monitored for his clinical response.  Chesley Mires, MD Diplomat, American Board of Sleep Medicine Electronically Signed    VS/MEDQ  D:  12/09/2010 14:12:56  T:  12/10/2010 05:10:48  Job:  [id number]

## 2010-12-10 NOTE — Assessment & Plan Note (Signed)
BP Readings from Last 3 Encounters:  12/09/10 140/100  11/13/10 146/84  10/28/10 138/92   Plan - resume medical therapy for BP with ACE/Hct            Follow-up readings with adjustments as needed.

## 2010-12-10 NOTE — Telephone Encounter (Signed)
PSG 11/30/10>>AHI 14, SpO2 low 87%.  CPAP 12 cm H2O.  Left message on pt's voicemail explaining that he has mild/moderate sleep apnea.  He did well with CPAP 12 cm H2O in lab.  He is to call back to notify if he wants to proceed with CPAP set up first, then ROV 6 to 8 weeks after CPAP.  Other option is to schedule ROV first and further discuss options for treating sleep apnea.  Advised him to call back to my nurse to notify about decision.

## 2010-12-10 NOTE — Assessment & Plan Note (Signed)
Reviewed with him again the importance of weight management including his c/o back pain   Plan - continued to emphasize smart food choices, portion size control, exercise with a goal of loosing 11-2 lbs per month.

## 2010-12-10 NOTE — Telephone Encounter (Signed)
Ok He will need a follow-up visit with me if not already on by July I think he has one

## 2010-12-20 DIAGNOSIS — F3289 Other specified depressive episodes: Secondary | ICD-10-CM

## 2010-12-20 DIAGNOSIS — F329 Major depressive disorder, single episode, unspecified: Secondary | ICD-10-CM

## 2010-12-20 DIAGNOSIS — G4733 Obstructive sleep apnea (adult) (pediatric): Secondary | ICD-10-CM

## 2010-12-24 NOTE — Discharge Summary (Addendum)
[name], [name]               ACCOUNT NO.:  [id number]  MEDICAL RECORD NO.:  [id number]  LOCATION:  5638                         FACILITY:  [facility]  PHYSICIAN:  Lowella Bandy. Olevia Perches, MD     DATE OF BIRTH:  07-06-1975  DATE OF ADMISSION:  10/14/2010 DATE OF DISCHARGE:  10/23/2010                              DISCHARGE SUMMARY   DISCHARGING PHYSICIAN:  Lowella Bandy. Olevia Perches, MD  PRIMARY GASTROENTEROLOGIST:  Gatha Mayer, MD, [facility]  DISPOSITION:  Home in stable condition.  DISCHARGE MEDICATIONS: 1. Valcyte 450 mg 2 tablets twice daily for 18 days. 2. Paxil 40 mg daily. 3. Remicade infusion every 6 weeks.  DISCONTINUED MEDICATIONS:  Imuran and ibuprofen.  CONSULTATIONS:  Infectious Disease.  PROCEDURES:  EGD by Dr. Owens Loffler.  DISCHARGE DIAGNOSES: 1. Nausea, vomiting, epigastric pain, and fever, resolving. Symptoms felt to be    secondary to cytomegalovirus. 2. History of fistulizing Crohn ileocolitis, stable.  The patient's     azathioprine has been discontinued secondary to #1. He will continue     Remicade infusions.  3. Depression, on treatment. 4. History of nonobstructive kidney stone.  HOSPITAL COURSE:  [name] [name] is a 35 year old male followed by Dr. Carlean Purl for history of fistualizing Crohn's disease for which he has been maintained on azathioprine and Remicade.  The patient was admitted on October 14, 2010 after being evaluated in the office for a several day  history of epigastric pain, nausea, vomiting, fever, and chills.Out of concern for viral etiology (?CMV), patient's Azathiopurine was held. Once admitted,  evaluation began with an upper endoscopy which was unrevealing except for gastritis and duodenitis. An ultrasound of the abdomen was ordered to evaluate the gallbladder as well as a kidney lesion which had been seen on a CT scan of the abdomen and pelvis in April of this year. The ultrasound was negative for gallstones or other gallbladder abnormalities.    There was a benign appearing 1 cm cyst on the left kidney. Infectious  Disease evaluated the patient for ongoing fever and chills. Epstein-Barr studies were compatible with past infection, HIV was negative.  Viral hepatitis panel negative, cryptococcal antigen negative.  Antinuclear antibody qualitative was positive but titer negative.  Histoplasma antigen was negative. Quantiferon TB was negative. Coccidiodes antibody was positive at 1:8. CMV DNA positive at 3079. [name] [name] continued to run fevers greater than 102. Infectious Disease obtained a CT scan of chest which showed a subcarinal lymph node, measuring 1.4 cm, and a left hilar lymph node, 1.2 cm. There was also a tiny nodule on the right lower lobe, measuring 3.6 mm.  Over the weekend Dr. Collene Mares, who was covering for our service, became concerned about gallbladder disease given ongoing upper abdominal pain and fevers. She ordered a HIDA scan for  further evaluation.  The patient's ejection fraction was very low but he was actively taking pain medications so results were unreliable. Infectious Disease obtained a PET scan to further evaluate abnormal lymph nodes seen  on CTscan.  The PET scan showed shotty hypermetabolic adenopathy within the neck, chest, abdomen, and pelvis. Findings were felt to be nonspecific. Lymph node biopsy was discussed, but Radiology felt their small  size would preclude biopsy. Dr. Carlean Purl, the patient's primary gastroenterologist,  was not on-call at the hospital for the week, but did weigh in on the case. He felt the patient's symptoms were secondary to CMV infection despite the fact that viral load was less than 100,000. The patient was started on Valcyte.  Within a couple of days the patient's fever had reduced to low grade, his nausea, vomiting, and abdominal pain had markedly improved.  The patient was eating, requiring less medication for pain, and desiring discharge. He was discharged home in stable  condition on above medications on Oct 23, 2010.  The plan was to hold azathioprine, as well as Remicade until further notice.  [name] [name] would follow up with Dr. Carlean Purl in the office within a couple of weeks. Tye Savoy, NP   ______________________________ Lowella Bandy. Olevia Perches, MD    PG/MEDQ  D:  12/22/2010  T:  12/23/2010  Job:  [id number]  Electronically Signed by Tye Savoy NP on 12/24/2010 10:20:53 AM Electronically Signed by Delfin Edis MD on 12/24/2010 06:14:06 PM

## 2010-12-25 NOTE — Telephone Encounter (Signed)
lmomtcb x1 

## 2010-12-29 ENCOUNTER — Ambulatory Visit (INDEPENDENT_AMBULATORY_CARE_PROVIDER_SITE_OTHER): Payer: 59 | Admitting: Internal Medicine

## 2010-12-29 ENCOUNTER — Encounter: Payer: Self-pay | Admitting: Internal Medicine

## 2010-12-29 DIAGNOSIS — B259 Cytomegaloviral disease, unspecified: Secondary | ICD-10-CM

## 2010-12-29 DIAGNOSIS — K5080 Crohn's disease of both small and large intestine without complications: Secondary | ICD-10-CM

## 2010-12-29 DIAGNOSIS — D899 Disorder involving the immune mechanism, unspecified: Secondary | ICD-10-CM

## 2010-12-29 DIAGNOSIS — Z79899 Other long term (current) drug therapy: Secondary | ICD-10-CM

## 2010-12-29 DIAGNOSIS — D849 Immunodeficiency, unspecified: Secondary | ICD-10-CM

## 2010-12-29 NOTE — Progress Notes (Signed)
  Subjective:    Patient ID: [name] [name], male    DOB: 09-16-1975, 35 y.o.   MRN: [id number]  HPI This is a 35 year old white man followed for Crohn's ileocolitis, status post ileocolonic resection. He is on Remicade therapy. His most recent problems for cytomegalovirus infection of the colon with severe diarrhea. His azathioprine was discontinued at that time. He had some persistent diarrhea but that resolved, so he never did submit his Clostridium difficile PCR test. He started Culturelle, a probiotic and believes that helped though the diarrhea also resolved when he restarted his Remicade infusions.   Review of Systems No fevers, no abdominal pain no skin rash reported doing well overall at this time.    Objective:   Physical Exam Obese well-developed well-nourished white man no acute distress Lungs clear Heart S1-S2 no rubs murmurs or gallops The abdomen is obese soft and nontender without organomegaly or mass, there are well-healed surgical scars        Assessment & Plan:

## 2010-12-29 NOTE — Assessment & Plan Note (Signed)
This appears to be resolved after antiviral treatment and withdrawal of azathioprine.

## 2010-12-29 NOTE — Assessment & Plan Note (Signed)
His diarrhea has resolved, and he is asymptomatic at this time. Continue his Remicade therapy, but will not restart azathioprine due to the CMV infection. Return for routine visit in about 6 months, sooner if needed.

## 2011-01-05 ENCOUNTER — Telehealth: Payer: Self-pay | Admitting: Internal Medicine

## 2011-01-05 ENCOUNTER — Telehealth: Payer: Self-pay

## 2011-01-05 DIAGNOSIS — B029 Zoster without complications: Secondary | ICD-10-CM | POA: Insufficient documentation

## 2011-01-05 MED ORDER — HYDROCODONE-APAP-DIETARY PROD 5-500 MG PO MISC: 1.0000 | ORAL | Status: DC | PRN

## 2011-01-05 MED ORDER — VALACYCLOVIR HCL 1 G PO TABS: 1000.0000 mg | ORAL_TABLET | Freq: Three times a day (TID) | ORAL | Status: DC

## 2011-01-05 NOTE — Telephone Encounter (Signed)
Dr Carlean Purl please advise?

## 2011-01-05 NOTE — Assessment & Plan Note (Signed)
Sounds like a recurrence as opposed to post-herpetic neuralgia Will treat with valcyclovir and hydrocodone/APAP and then arrange for office assessment

## 2011-01-05 NOTE — Telephone Encounter (Signed)
Recurrent shingles occurs only 5% of time. Please have one of my colleagues see him for quick look before calling in medications.

## 2011-01-05 NOTE — Telephone Encounter (Signed)
Pt states that he was treated for shingles in April by GI Dr. Nita Sickle. C/o shingles breakout on his chest that is very painful and would like to know if he should schedule and OV or can something be sent to pharmacy for him. Please advise

## 2011-01-05 NOTE — Telephone Encounter (Signed)
Appointment scheduled.

## 2011-01-05 NOTE — Telephone Encounter (Signed)
Painful dermatomal distribution just like shingles before - in upper chest. No rash yet No fever Ibuprofen and tylenol not helping the pain  Plan valcyclovir 1000 mg tid and hydrocodone/APAP - see meds - sent and faxed respectively  We will call him (RN) to reasess and arrange office follow-up with me/extender later this week

## 2011-01-06 NOTE — Telephone Encounter (Signed)
Scheduled patient for OV on 01/09/11 at 2:30 PM. Left a message for patient to call me.

## 2011-01-07 NOTE — Telephone Encounter (Signed)
Patient left a message that he was returning my call. Called patient back and left a message with his appointment time on 01/09/11 and a request he call me if he cannot keep this appointment.

## 2011-01-08 NOTE — Telephone Encounter (Signed)
lmomtcb x1 

## 2011-01-09 ENCOUNTER — Ambulatory Visit: Payer: 59 | Admitting: Internal Medicine

## 2011-01-14 ENCOUNTER — Ambulatory Visit (HOSPITAL_COMMUNITY)
Admission: RE | Admit: 2011-01-14 | Discharge: 2011-01-14 | Disposition: A | Discharge status: Home / Self Care | Payer: 59 | Source: Ambulatory Visit | Attending: Internal Medicine | Admitting: Internal Medicine

## 2011-01-14 DIAGNOSIS — K5090 Crohn's disease, unspecified, without complications: Secondary | ICD-10-CM | POA: Insufficient documentation

## 2011-01-16 NOTE — Telephone Encounter (Signed)
lmomtcb x1 

## 2011-01-21 ENCOUNTER — Encounter: Payer: Self-pay | Admitting: *Deleted

## 2011-01-21 ENCOUNTER — Telehealth: Payer: Self-pay | Admitting: Internal Medicine

## 2011-01-21 DIAGNOSIS — B029 Zoster without complications: Secondary | ICD-10-CM

## 2011-01-21 NOTE — Telephone Encounter (Signed)
Letter sent for patient to call for an appt with Dr. Halford Chessman.

## 2011-01-21 NOTE — Telephone Encounter (Signed)
[name] [name] is under good control.  He is still having pain at the shingles site.  Dr Carlean Purl please advise

## 2011-01-22 NOTE — Telephone Encounter (Signed)
Dr Carlean Purl the patient got 30 on 01/05/11 according to [facility].  Is it ok to refill hydrocodone?

## 2011-01-23 MED ORDER — HYDROCODONE-APAP-DIETARY PROD 5-500 MG PO MISC: 1.0000 | ORAL | Status: DC | PRN

## 2011-01-23 NOTE — Telephone Encounter (Signed)
Ok to refill hydrocodone - #30 x 1 he needs to see me next week

## 2011-01-23 NOTE — Telephone Encounter (Signed)
I have left a message for the patient that he is scheduled for REV 01/28/11 11:15 and that he can pick up his RX

## 2011-01-28 ENCOUNTER — Ambulatory Visit (INDEPENDENT_AMBULATORY_CARE_PROVIDER_SITE_OTHER): Payer: 59 | Admitting: Internal Medicine

## 2011-01-28 ENCOUNTER — Encounter: Payer: Self-pay | Admitting: Internal Medicine

## 2011-01-28 VITALS — BP 162/106 | HR 68 | Ht 69.0 in | Wt 225.6 lb

## 2011-01-28 DIAGNOSIS — D899 Disorder involving the immune mechanism, unspecified: Secondary | ICD-10-CM

## 2011-01-28 DIAGNOSIS — D849 Immunodeficiency, unspecified: Secondary | ICD-10-CM

## 2011-01-28 DIAGNOSIS — B029 Zoster without complications: Secondary | ICD-10-CM

## 2011-01-28 DIAGNOSIS — B0229 Other postherpetic nervous system involvement: Secondary | ICD-10-CM | POA: Insufficient documentation

## 2011-01-28 MED ORDER — HYDROCODONE-ACETAMINOPHEN 5-500 MG PO TABS: 1.0000 | ORAL_TABLET | ORAL | Status: DC | PRN

## 2011-01-28 MED ORDER — NORTRIPTYLINE HCL 25 MG PO CAPS: 25.0000 mg | ORAL_CAPSULE | Freq: Every day | ORAL | Status: DC

## 2011-01-28 NOTE — Patient Instructions (Signed)
Your prescription(s) has(have) been sent to your pharmacy for you to pick up (Hydrocodone and Nortriptyline). Call us back in 1 month with an update on your progress. Return to see Dr. Carlean Purl in 3 months.

## 2011-01-28 NOTE — Progress Notes (Signed)
  Subjective:    Patient ID: [name] [name], male    DOB: [birth year], 34 y.o.   MRN: [id number]  HPI Still with left chest wall pain where shingles rash was. Rash gone (scar). Sharp and painful, worst at night. Hydrocodone helps but still a problem.    Review of Systems As above     Objective:   Physical Exam obese NAD Small hyperpigmented area in left  T 5or 6 dermatome - mid axillary line, somewhat hyperesthetic there also No other trash        Assessment & Plan:

## 2011-01-29 ENCOUNTER — Encounter: Payer: Self-pay | Admitting: Internal Medicine

## 2011-01-29 NOTE — Assessment & Plan Note (Signed)
Continue hydrocodone-APAP and add nortripylline. I explained that this may take time. Investigate if any sense to vaccinate.  Will refer to ID clinic for opinions re: vaccination indicated?  Would hold Remicade if so, I think

## 2011-01-29 NOTE — Assessment & Plan Note (Addendum)
Creates difficulties with zoster vaccine as cannot give while on treatment. ? If vaccine any help at this point but may be. Will investigate. He is not immune to hepatitis B and will need vaccination. Needs a hepatitis A total Ab to check immunity. Need to reassess other vaccine status - I looked in E chart and do not think he has had pneumococcal vaccine, either so he will need that also. Will contact him about this

## 2011-01-29 NOTE — Assessment & Plan Note (Addendum)
Sounds like he had recurrence (Feb and July) CMV was in between. Azathioprine was stopped. Finished another course of valcyclovir. Hold off on next Remicade til he sees ID

## 2011-01-30 ENCOUNTER — Telehealth: Payer: Self-pay

## 2011-01-30 DIAGNOSIS — D849 Immunodeficiency, unspecified: Secondary | ICD-10-CM

## 2011-01-30 NOTE — Telephone Encounter (Signed)
Left message for pt to call back  °

## 2011-01-30 NOTE — Telephone Encounter (Signed)
Message copied by Faythe Casa on Fri Jan 30, 2011 10:31 AM ------      Message from: Silvano Rusk E      Created: Thu Jan 29, 2011  8:39 PM      Regarding: tests and consult       1) Let him know I would like to refer him to ID clinic re: recurrent zoster in setting of Remicade therapy - ? Vaccinate, other - he has seen Dr. Johnnye Sima and Tommy Medal in the hosiptal before re: CMV infection      2) I need him to get a hepatitis A antibody total, use immunosuppression dx      3) Do not take Remicade unless I or ID doc says to go ahead, next one in a few weeks      4) He is going to need pneumococcal vaccine and hepatitis vaccines and will let him know once I get HAV titer back

## 2011-02-02 NOTE — Telephone Encounter (Signed)
Pt aware. Lab orders entered. Pt to see Dr. Tommy Medal 02/18/11@11 :30am. Office located in [place] [address]. Message left for pt to call back.

## 2011-02-02 NOTE — H&P (Signed)
[name], [name]               ACCOUNT NO.:  [id number]  MEDICAL RECORD NO.:  [id number]           PATIENT TYPE:  I  LOCATION:  5409                         FACILITY:  [facility]  PHYSICIAN:  Milus Banister, MD   DATE OF BIRTH:  1975-12-25  DATE OF ADMISSION:  10/14/2010 DATE OF DISCHARGE:  10/23/2010                             HISTORY & PHYSICAL   GASTROENTEROLOGIST:  The patient's primary gastroenterologist is Dr. Gatha [name]  HISTORY OF PRESENT ILLNESS:  [name] [name] is a 35 year old white male followed by Dr. Carlean [name] for history of fistulizing Crohn disease.  The patient had an ileocolectomy in 2009 for stricture/abscess.  The patient is currently maintained on azathioprine and Remicade.  His last flare was 7 to 8 months ago.  At present, his bowel movements are at baseline which is loose, about 5 times a day.  Since Thursday the patient has been having nausea, vomiting, epigastric pain, sweats and chills.  Epigastric pain is unrelated to meals, does not radiate through to the back and is described as stabbing in nature.  The patient does take ibuprofen, several a week.  No hematemesis.  No sick contacts.  No new medications. No GERD symptoms.  Weight is stable.  He is being admitted for further evaluation and treatment of aforementioned symptoms.  PAST MEDICAL HISTORY:  Crohn's ileocolitis, depression, shingles, nephrolithiasis.  FAMILY HISTORY:  No colorectal cancer or inflammatory bowel disease.  SOCIAL HISTORY:  Never smoked.  The patient is divorced, with children. No significant alcohol use.  HOME MEDICATIONS:  Paxil 40 mg daily, Remicade infusion every 6 weeks, ibuprofen as needed, azathioprine 200 mg daily.  ALLERGIES:  No known drug allergies.  REVIEW OF SYSTEMS:  Positive for fever.  Positive for generalized body aches.  Positive for decreased urine output.  Positive for depression. All other systems reviewed and negative except were noted in history  of present illness.  LABORATORY STUDIES:  Labs drawn in our office yesterday revealed a white count of 3.9, hemoglobin 14.8, platelets 176, MCV 95.  Sodium 138, potassium 3.5, chloride 95, CO2 of 30, BUN 12, creatinine 1.0, AST 45. LFTs otherwise normal.  Note, monocytes elevated at 18.9 on cell differential.  Urinalysis was normal.  IMAGING:  CT scan of the abdomen and pelvis with IV contrast in April of this year showed the gallbladder decompressed.  Small, mildly complex left renal exophilic lesion, 1.0 cm in size, with possible peripheral enhancement.  Small bowel anastomosis unremarkable.  Minimal left diverticulosis.  Small, nonobstructing 2 mm to 3 mm stone in both kidneys.  IMPRESSION AND PLAN:  The patient is a 35 year old white male with: 1. History fistulizing Crohn's ileocolitis, status post ileocolectomy     in February 2009, maintained on Remicade and Azathioprine for     about a year now.   2. Five day history of chills, sweats, nausea, vomiting and epigastric     pain.  Rule out viral etiologies such as cytomegalovirus.  Rule out    development of upper gastrointestinal tract Crohn's disease.  Note,     the patient is not having diarrhea typical of  his IBD flares.  Rule out biliary    disease with right upper quadrant tenderness on examination. 2. Depression, treated. 3. Nonobstructive kidney stones.  PLAN:  Admit for IV hydration, analgesics, hold azathioprine.  Begin DVT prophylaxis.  Possible upper endoscopy and/or ultrasound for evaluation of above symptoms.  Will recheck labs today.  Clear liquids as tolerated.  Infectious Disease consult will be placed for ongoing fevers.     Tye Savoy, NP   ______________________________ Milus Banister, MD    PG/MEDQ  D:  11/18/2010  T:  11/19/2010  Job:  [id number]  Electronically Signed by Tye Savoy NP on 12/22/2010 02:29:48 PM Electronically Signed by Owens Loffler MD on 02/02/2011 12:37:26 PM

## 2011-02-03 ENCOUNTER — Telehealth: Payer: Self-pay

## 2011-02-03 ENCOUNTER — Other Ambulatory Visit: Payer: Self-pay | Admitting: Licensed Clinical Social Worker

## 2011-02-03 DIAGNOSIS — Z9225 Personal history of immunosupression therapy: Secondary | ICD-10-CM

## 2011-02-03 DIAGNOSIS — B029 Zoster without complications: Secondary | ICD-10-CM

## 2011-02-03 NOTE — Telephone Encounter (Signed)
Message copied by Marlon Pel on Tue Feb 03, 2011 11:32 AM ------      Message from: HUNT, [place]      Created: Mon Feb 02, 2011 10:07 AM      Regarding: FW: tests and consult                   ----- Message -----         From: Gatha Mayer, MD         Sent: 01/29/2011   8:39 PM           To: Maury Dus, RN      Subject: tests and consult                                        1) Let him know I would like to refer him to ID clinic re: recurrent zoster in setting of Remicade therapy - ? Vaccinate, other - he has seen Dr. Johnnye Sima and Tommy Medal in the hosiptal before re: CMV infection      2) I need him to get a hepatitis A antibody total, use immunosuppression dx      3) Do not take Remicade unless I or ID doc says to go ahead, next one in a few weeks      4) He is going to need pneumococcal vaccine and hepatitis vaccines and will let him know once I get HAV titer back

## 2011-02-03 NOTE — Telephone Encounter (Signed)
See additional phone note for additional documentation

## 2011-02-04 NOTE — Telephone Encounter (Signed)
Patient has not returned calls about the appt.  i have left him a detailed message with the appt details and I will mail him a letter.  I have asked him to please call back and confirm he has gotten my messages.

## 2011-02-18 ENCOUNTER — Ambulatory Visit: Payer: 59 | Admitting: Infectious Disease

## 2011-02-20 ENCOUNTER — Telehealth: Payer: Self-pay | Admitting: Internal Medicine

## 2011-02-20 DIAGNOSIS — B0229 Other postherpetic nervous system involvement: Secondary | ICD-10-CM

## 2011-02-20 NOTE — Telephone Encounter (Signed)
Patient missed his Infectious Disease and is out of Vicodin. Has r/s with ID on 03/03/11. Would like pain medication refilled. Fax from pharmacy has been sent.

## 2011-02-25 ENCOUNTER — Encounter (HOSPITAL_COMMUNITY): Payer: 59

## 2011-02-25 ENCOUNTER — Other Ambulatory Visit: Payer: Self-pay

## 2011-02-25 MED ORDER — INFLIXIMAB 100 MG IV SOLR: INTRAVENOUS | Status: DC

## 2011-03-03 ENCOUNTER — Ambulatory Visit (INDEPENDENT_AMBULATORY_CARE_PROVIDER_SITE_OTHER): Payer: 59 | Admitting: Infectious Disease

## 2011-03-03 ENCOUNTER — Encounter: Payer: Self-pay | Admitting: Infectious Disease

## 2011-03-03 VITALS — BP 148/98 | HR 79 | Temp 98.3°F | Ht 69.0 in | Wt 225.5 lb

## 2011-03-03 DIAGNOSIS — B0229 Other postherpetic nervous system involvement: Secondary | ICD-10-CM

## 2011-03-03 DIAGNOSIS — B029 Zoster without complications: Secondary | ICD-10-CM

## 2011-03-03 DIAGNOSIS — B259 Cytomegaloviral disease, unspecified: Secondary | ICD-10-CM

## 2011-03-03 MED ORDER — OXYCODONE-ACETAMINOPHEN 10-325 MG PO TABS: 1.0000 | ORAL_TABLET | Freq: Every evening | ORAL | Status: DC | PRN

## 2011-03-03 NOTE — Progress Notes (Signed)
Subjective:    Patient ID: [name] [name], male    DOB: 31-Mar-1976, 35 y.o.   MRN: [id number]  HPI  58 year old with Crohns disease whom I met in th hospital at [facility] when he had a FUO that turned out to be CMV infection --which resolved with antiviral therapy. He has had an outbreak fo zoster in college prior to dx of his CD. He then recently had painful outbreakt of vesicular lesion in dermatomal distribution on chest evaluated and rx by Dr Carlean Purl and thought to be yet another outbreak of varicella. He remembers distinctly having the chickepox as a child. The patient has had his biologic therapy held for past 7 weeks in anticipation of possibly giving him the zostavax vaccine. He was referred to our clinic to assess for the safety of giving him this vaccine and to help in the risk benefit calculus of giving him this vaccine. I spent greater than 45 minutes with the patient including greater than 50% of time counselling the pt reviewing the literature and coordinating care. Initially I was reluctant to giv him zostavax given the package inserts warnings against giving the vaccine within 3 months of immonsuppressive therapy. However upon review of recent retrospecitve review from [place] I thin he could be given teh vaccine now with low likelihood of disseminated vaccine infection a risk that could be further attenuated by delaying his TNF alpha therapy for another 6 weeks post vaccine, while in the interim controlling his CD with steroids or other less potent immunosuppressives.  Review of Systems  Constitutional: Negative for fever, chills, diaphoresis, activity change, appetite change, fatigue and unexpected weight change.  HENT: Negative for congestion, sore throat, rhinorrhea, sneezing, trouble swallowing and sinus pressure.   Eyes: Negative for photophobia and visual disturbance.  Respiratory: Negative for cough, chest tightness, shortness of breath, wheezing and stridor.   Cardiovascular:  Negative for chest pain, palpitations and leg swelling.  Gastrointestinal: Negative for nausea, vomiting, abdominal pain, diarrhea, constipation, blood in stool, abdominal distention and anal bleeding.  Genitourinary: Negative for dysuria, hematuria, flank pain and difficulty urinating.  Musculoskeletal: Positive for arthralgias. Negative for myalgias, back pain, joint swelling and gait problem.  Skin: Positive for rash. Negative for color change, pallor and wound.  Neurological: Negative for dizziness, tremors, weakness and light-headedness.  Hematological: Negative for adenopathy. Does not bruise/bleed easily.  Psychiatric/Behavioral: Negative for behavioral problems, confusion, sleep disturbance, dysphoric mood, decreased concentration and agitation.       Objective:   Physical Exam  Constitutional: He is oriented to person, place, and time. He appears well-developed and well-nourished. No distress.  HENT:  Head: Normocephalic and atraumatic.  Mouth/Throat: Oropharynx is clear and moist. No oropharyngeal exudate.  Eyes: Conjunctivae and EOM are normal. Pupils are equal, round, and reactive to light. No scleral icterus.  Neck: Normal range of motion. Neck supple. No JVD present.  Cardiovascular: Normal rate, regular rhythm and normal heart sounds.  Exam reveals no gallop and no friction rub.   No murmur heard. Pulmonary/Chest: Effort normal and breath sounds normal. No respiratory distress. He has no wheezes. He has no rales. He exhibits no tenderness.  Abdominal: He exhibits no distension and no mass. There is no tenderness. There is no rebound and no guarding.  Musculoskeletal: He exhibits no edema and no tenderness.  Lymphadenopathy:    He has no cervical adenopathy.  Neurological: He is alert and oriented to person, place, and time. He has normal reflexes. He exhibits normal muscle  tone. Coordination normal.  Skin: Skin is warm and dry. Rash noted. He is not diaphoretic. No erythema.  No pallor.          Healing rash where he apparently had zoster  Psychiatric: He has a normal mood and affect. His behavior is normal. Judgment and thought content normal.          Assessment & Plan:

## 2011-03-03 NOTE — Assessment & Plan Note (Signed)
Upon first reviewing the literature and recommendations to avoid vaccination of patients receivign potent immunosuppressive drugs such as infliximab, I felt reluctant to recommend this vaccine to [name] [name] while he was in any window of receiving such agents and indeed current product inserts caution against giving such vaccines within 3 months after receipt of such therapy. I therefore initially proposed one of two options to the patient: Forego zostavax  Vs change to an immunosuppressive drug that is not contraindicated in this case and avoid biologics for the three months prior to and 3 months after receipt of the vaccine  [facility] after the patient left clinic I was able to delve a bit deeper into the matter and found an article in [place] from July of 2012 in which nearly 500K medicare patients were examined retrospectively who were receiving immunosuppresive therapy. This study found reduced risk of zoster infection in those who received the vaccine. SPecifically among the 600 plus patients who received BIologics such as infliximab there were ZERO cases of zoster or complications of infection from the live vaccine in 42 days following receipt of the vaccine. The authors questioned the contention that such patients should not receive zostavax and recommended randomized trial to further clarify the matter.   I think that based on this data, I would actually feel comfortable having [name] [name] receive the zostavax vaccine. I would however consider witholding the TNF alpha antagonist therapy until he was out of the window of the incubation period for the live vaccine. During this time his CD could be managed with other less potent immunosuppressives. After the 42 day window he could then be given his Infliximab again.   Association Between Vaccination for Herpes Zoster and Risk of Herpes Zoster Infection Among Older Patients With Selected Immune-Mediated Diseases

## 2011-03-03 NOTE — Patient Instructions (Addendum)
Based on package insert and recommendations I would delay zostavax vaccine for three months after infliximab. One could have you back on azathioprine however in the interim if vaccination was pursued  Immunosuppressants (such as infliximab) : May enhance the adverse/toxic effect of Vaccines (Live). Vaccinial infections may develop. Immunosuppressants may diminish the therapeutic effect of Vaccines    Avoid use of live organism vaccines with immunosuppressants; live-attenuated vaccines should not be given for at least 3 months after immunosuppressants.   Exceptions: AzaTHIOprine; Beclomethasone; Beclomethasone (Oral Inhalation); Betamethasone; Budesonide; Budesonide (Systemic, Oral Inhalation); Corticotropin; Cortisone; Cytarabine (Liposomal); Dexamethasone; Dexamethasone (Systemic); Fludrocortisone; Fluticasone; Fluticasone (Oral Inhalation); Hydrocortisone; Hydrocortisone (Systemic); Hydroxychloroquine; Leflunomide; Mercaptopurine; Methotrexate; MethylPREDNISolone; Mometasone; PrednisoLONE; PrednisoLONE (Systemic); PredniSONE; Triamcinolone; Triamcinolone (Systemic).

## 2011-03-03 NOTE — Assessment & Plan Note (Signed)
resolved 

## 2011-03-03 NOTE — Assessment & Plan Note (Signed)
I will increase his narcotics to 59m of percocet at night as his pain is not well controlled on vicodin but he will need to talk to his PCP with re to continuation of this dose

## 2011-03-05 ENCOUNTER — Telehealth: Payer: Self-pay | Admitting: Internal Medicine

## 2011-03-05 NOTE — Telephone Encounter (Signed)
Dr Drucilla Schmidt has instructed the patient that he will need to be off Remicade for 3 months prior to shingles vaccine then for 3 months after.

## 2011-03-06 NOTE — Telephone Encounter (Signed)
I have left a message for the patient with Dr Celesta Aver response

## 2011-03-06 NOTE — Telephone Encounter (Signed)
That is not what I interpreted from Dr. Derek Mound note - looked like needed to be off 42 days after the vaccine I will clarify and let him know next week with a call from me or you -

## 2011-03-12 LAB — I-STAT 8, (EC8 V) (CONVERTED LAB)
BUN: 14
Bicarbonate: 26 — ABNORMAL HIGH
Glucose, Bld: 94
Operator id: 294511
pCO2, Ven: 34.3 — ABNORMAL LOW

## 2011-03-13 LAB — CBC
HCT: 43.5
HCT: 34.7 — ABNORMAL LOW
HCT: 36.9 — ABNORMAL LOW
HCT: 40.7
HCT: 43.7
Hemoglobin: 14.9
Hemoglobin: 11.6 — ABNORMAL LOW
Hemoglobin: 12.3 — ABNORMAL LOW
Hemoglobin: 14.9
MCHC: 34.3
MCHC: 33.5
MCV: 89.8
MCV: 90.7
MCV: 90.7
Platelets: 179
Platelets: 210
Platelets: 327
RBC: 4.53
RBC: 4.86
RDW: 14
RDW: 14.3
RDW: 14.4
RDW: 15.1
WBC: 11.2 — ABNORMAL HIGH
WBC: 14.3 — ABNORMAL HIGH
WBC: 9.7
WBC: 9.8

## 2011-03-13 LAB — BASIC METABOLIC PANEL
BUN: 10
BUN: 10
BUN: 6
BUN: 6
CO2: 29
CO2: 31
Calcium: 8.5
Calcium: 8.9
Calcium: 9.1
Chloride: 102
Chloride: 104
Creatinine, Ser: 0.71
Creatinine, Ser: 0.74
Creatinine, Ser: 0.89
GFR calc Af Amer: >60
GFR calc Af Amer: >60
GFR calc Af Amer: >60
GFR calc non Af Amer: >60
GFR calc non Af Amer: >60
GFR calc non Af Amer: >60
GFR calc non Af Amer: >60
Glucose, Bld: 103 — ABNORMAL HIGH
Glucose, Bld: 108 — ABNORMAL HIGH
Glucose, Bld: 116 — ABNORMAL HIGH
Glucose, Bld: 123 — ABNORMAL HIGH
Glucose, Bld: 95
Potassium: 4.1
Potassium: 4.3
Potassium: 4.4
Potassium: 4.5
Sodium: 137
Sodium: 139

## 2011-03-13 LAB — POCT I-STAT CREATININE: Operator id: 161631

## 2011-03-13 LAB — DIFFERENTIAL
Basophils Absolute: 0
Basophils Relative: 0
Eosinophils Relative: 0
Lymphocytes Relative: 6 — ABNORMAL LOW
Monocytes Absolute: 1.1 — ABNORMAL HIGH

## 2011-03-13 LAB — I-STAT 8, (EC8 V) (CONVERTED LAB)
Acid-Base Excess: 2
Chloride: 103
pCO2, Ven: 39.6 — ABNORMAL LOW
pH, Ven: 7.435 — ABNORMAL HIGH

## 2011-03-13 LAB — URINE CULTURE

## 2011-03-13 LAB — URINE MICROSCOPIC-ADD ON

## 2011-03-13 LAB — URINALYSIS, ROUTINE W REFLEX MICROSCOPIC
Glucose, UA: NEGATIVE
Hgb urine dipstick: NEGATIVE
Specific Gravity, Urine: 1.01

## 2011-03-17 ENCOUNTER — Telehealth: Payer: Self-pay | Admitting: Internal Medicine

## 2011-03-17 ENCOUNTER — Other Ambulatory Visit: Payer: Self-pay | Admitting: *Deleted

## 2011-03-17 DIAGNOSIS — B0229 Other postherpetic nervous system involvement: Secondary | ICD-10-CM

## 2011-03-17 NOTE — Telephone Encounter (Signed)
I spoke to him but was cut off before I could give him all info  1) Please get him an appointment with Dr. [name] in neurology this week or next re: post-herpetic neuralgia  2) If he wants to do Zoster vaccine they have it in primary care per Dr. Linda Hedges - arrange it if he wants to do it. I endorse it but cannot promise benefit.   3)If he takes the zoster vaccine then NO REMICADE for 42 days and do not take any other immunosuppressants for 42 days unless I ok it or ID oks it.  4) If he decides against zoster vaccine now then resume Remicade as before (he is 9 weeks since last)

## 2011-03-17 NOTE — Telephone Encounter (Signed)
He called & LM asking for refill of pain med. Per OV notes, md will defer further refills back to his pcp. Will tell him this when he calls me back. He also referred to his shingles vaccine? Will ask him what is needed when he calls back

## 2011-03-17 NOTE — Telephone Encounter (Signed)
Dr Carlean Purl please advise on what you want to do with Shingles vaccine.

## 2011-03-17 NOTE — Telephone Encounter (Signed)
He called back. I told him about the pain meds coming from the pcp. He told me he will call that md. He states the mds are still deciding what to do about the Zoster vaccine

## 2011-03-18 ENCOUNTER — Encounter: Payer: Self-pay | Admitting: Neurology

## 2011-03-18 ENCOUNTER — Telehealth: Payer: Self-pay | Admitting: Internal Medicine

## 2011-03-18 ENCOUNTER — Telehealth: Payer: Self-pay | Admitting: *Deleted

## 2011-03-18 NOTE — Telephone Encounter (Signed)
Spoke w/pt - He is scheduled for OV with Dr [name] next week Dx: post herpetic neuralgia. Dr Carlean Purl and Dr Tommy Medal will no longer give pt pain meds and he is out.   1. Pt has been given percocet and hydrocodone recently and would like RX for either for pain until he see's Dr [name] next week. He also was given nortriptyline which he says has not helped.   2. Please see notes from GI and ID. They suggested looking into shingles vaccine due to this recent infection and that he is immunocompromised. He feels that he has been given the option and wants Dr Linda Hedges opinion. Should he get this? He will check coverage thru insurance but wants to know if it is worth the cost if he has to pay out of pocket?  (Pt is willing to come in for OV for eval and med management but there are no open apts at this time)

## 2011-03-18 NOTE — Telephone Encounter (Signed)
Patient is scheduled for Dr [name] 03/26/11 1:30.  Patient is aware.  He wants to think about zoster vaccine.  He will call back once he makes a decision.

## 2011-03-19 MED ORDER — GABAPENTIN 300 MG PO CAPS: ORAL_CAPSULE | ORAL | Status: DC

## 2011-03-19 MED ORDER — HYDROCODONE-ACETAMINOPHEN 5-325 MG PO TABS
1.0000 | ORAL_TABLET | Freq: Four times a day (QID) | ORAL | Status: DC | PRN
Start: 2011-03-19 — End: 2012-05-10

## 2011-03-19 MED ORDER — ZOSTER VACCINE LIVE 19400 UNT/0.65ML ~~LOC~~ SOLR: 0.6500 mL | Freq: Once | SUBCUTANEOUS | Status: DC

## 2011-03-19 NOTE — Telephone Encounter (Signed)
[facility] for refill on hydrocodone/apap 5/325 1 po q6 prn #60 2. Gabapentin 352m for post herpetic neuralgia: 1 qd x 1, 1 bid x 1, 1 tid x 3, if tolerating advance to 2 (600 mg) tid. #180 3. OK for zostavax

## 2011-03-19 NOTE — Telephone Encounter (Signed)
Left mess to call office back to inform and need to confirm pharmacy

## 2011-03-19 NOTE — Telephone Encounter (Signed)
Patient informed. 

## 2011-03-19 NOTE — Telephone Encounter (Signed)
I have left a message that Dr Linda Hedges has authorized pain meds and that he should speak to his office about that.  He can call back for any questions.

## 2011-03-23 ENCOUNTER — Other Ambulatory Visit: Payer: Self-pay | Admitting: *Deleted

## 2011-03-23 MED ORDER — ZOSTER VACCINE LIVE 19400 UNT/0.65ML ~~LOC~~ SOLR: 0.6500 mL | Freq: Once | SUBCUTANEOUS | Status: DC

## 2011-03-26 ENCOUNTER — Encounter: Payer: Self-pay | Admitting: Neurology

## 2011-03-26 ENCOUNTER — Ambulatory Visit (INDEPENDENT_AMBULATORY_CARE_PROVIDER_SITE_OTHER): Payer: 59 | Admitting: Neurology

## 2011-03-26 VITALS — BP 136/96 | HR 80 | Ht 69.0 in | Wt 231.0 lb

## 2011-03-26 DIAGNOSIS — B0229 Other postherpetic nervous system involvement: Secondary | ICD-10-CM

## 2011-03-26 MED ORDER — GABAPENTIN 300 MG PO CAPS: 1200.0000 mg | ORAL_CAPSULE | Freq: Three times a day (TID) | ORAL | Status: DC

## 2011-03-26 MED ORDER — OXYCODONE-ACETAMINOPHEN 10-325 MG PO TABS: 1.0000 | ORAL_TABLET | Freq: Four times a day (QID) | ORAL | Status: DC | PRN

## 2011-03-26 NOTE — Progress Notes (Signed)
Dear Dr. Linda [name] and Dr. Carlean [name],  Thank you for having me see [name] [name] in consultation today at [facility] for his problem with post-herpetic neuralgia.  As you may recall, he is a 35 y.o. year old male with a history of shingles in the setting of chronic immunosuppression with Remicade for his Crohn's disease.  He has had shingles before involving the left shoulder but had another bout in February of this year.  The post herpetic dysthesias has been unresponsive to nortriptyline 30m qhs.  He was started on gabapentin and rapidly increased to 6079mtid over 1 week.  He notes that Percocet prescribed by Dr. KeTally Dueas been effective at treating the pain when it gets extremely bad.  Hydrocodone ha not helped.  He just had the shingles vaccine on Monday without any incident.  He has been off his Remicade for about 10 weeks because of the need for the vaccine.  Past Medical History  Diagnosis Date  . Obstructive sleep apnea (adult) (pediatric)   . Anxiety and depression   . Essential hypertension, benign   . Obesity, unspecified   . Herpes zoster infection 07/2010, 12/2010    with post-herpetic neuralgia  . Vitamin D deficiency   . Crohn's disease   . Hyperlipidemia   . CMV (cytomegalovirus infection) 10/28/2010  . Nephrolithiasis   . Fistula, perirectal     history of    Past Surgical History  Procedure Date  . Small intestine surgery     1 ft removed, ileo-cecectomy  . Hypospadias correction     Childhood   . Upper gastrointestinal endoscopy 10/15/2010    w/biopsy, mild gastritis and duodenitis  . Colonoscopy 10/14/2007    crohn's colitis, aphthous ulcers, mild anal stenosis    History   Social History  . Marital Status: Legally Separated    Spouse Name: N/A    Number of Children: 2  . Years of Education: 14   Occupational History  . [name]   customer service   Social History Main Topics  . Smoking status: Never Smoker   . Smokeless tobacco:  Never Used  . Alcohol Use: No  . Drug Use: No  . Sexually Active: Not Currently -- Male partner(s)   Other Topics Concern  . None   Social History Narrative   HSG, [employer] 2 years. Married '99 - 1039yrivorced. 2 sons - '98, '00 split custody. 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 No exercise. No history of physical or sexual abuse.     Family History  Problem Relation Age of Onset  . Heart disease Father     CAD/MI  . Hyperlipidemia Father   . Hypertension Father   . Gout Father   . Sleep apnea Father   . Colon cancer Neg Hx   . Diabetes Neg Hx   . COPD Neg Hx   . Obesity Mother   . Stroke Maternal Grandfather   . Allergies Sister   . Allergies Mother   . Asthma Sister   . Cancer Mother       ROS:  13 52stems were reviewed and are notable for anxiety and depression.  All other review of systems are unremarkable.   Examination:  Filed Vitals:   03/26/11 1224  BP: 136/96  Pulse: 80  Height: 5' 9"  (1.753 m)  Weight: 231 lb (104.781 kg)     In general, obese young man in NAD.  Cardiovascular: The patient has a regular  rate and rhythm and no carotid bruits.  Fundoscopy:  Disks are flat. Vessel caliber within normal limits.  Mental status:   The patient is oriented to person, place and time. Recent and remote memory are intact. Attention span and concentration are normal. Language including repetition, naming, following commands are intact. Fund of knowledge of current and historical events, as well as vocabulary are normal.  Cranial Nerves: Pupils are equally round and reactive to light. Visual fields full to confrontation. Extraocular movements are intact without nystagmus. Facial sensation and muscles of mastication are intact. Muscles of facial expression are symmetric. Hearing intact to bilateral finger rub. Tongue protrusion, uvula, palate midline.  Shoulder shrug intact  Motor:  The patient has normal bulk and tone, no pronator drift.  There  are no adventitious movements.   SA EF EE FA HF KF KE FDF FPF Right 5 5 5 5 5 5 5 5 5  Left 5 5 5 5 5 5 5 5 5   Reflexes:   Biceps  Triceps Brachioradialis Knee Ankle  Right 2+  2+  2+   2+ 2+  Left  2+  2+  2+   2+ 2+  Toes down  Coordination:  Normal finger to nose.  No dysdiadokinesia.  Sensation is intact to touch.  Gait and Station are normal.  Tandem gait is intact.  Romberg is negative  Skin: two obvious post-herpetic lesion in left underarm(?T6) with significant pain on light touch.   Impression: Post herpetic neuralgia.   Recommendations: I am going to give him a schedule to increase the gabapentin to 3647m a day.  I will also give him PRN percocet for breakthrough pain.  If that does not work to control the pain then he is going to call [place] I would start Lyrica and increase it to 6021ma day.  If we are unsuccessful with Lyrica then I would suggest a long acting narcotic such as methadone.   We will see the patient back in 6 weeks.  Thank you for having us[place]oreaee [name] Flown consultation.  Feel free to contact me with any questions.  MaKavin LeechoJacelyn [name] [facility], [place] [address] [id number]: [phone]: [phone]

## 2011-03-26 NOTE — Patient Instructions (Signed)
After 1 week, and if the pain persists then increase to gabapentin 334m tabs 3 tabs three times per day. If pain continues after 1 week then increase to 4 tabs three times per day. If after 1 week and pain persists call for change of medication.

## 2011-04-02 ENCOUNTER — Telehealth: Payer: Self-pay

## 2011-04-02 NOTE — Telephone Encounter (Signed)
Message copied by Marlon Pel on Thu Apr 02, 2011 11:52 AM ------      Message from: Silvano Rusk E      Created: Wed Apr 01, 2011  6:34 PM      Regarding: next remicade       What plans do we have for him to restart remicade - should be able to do so 6 weeks - 42 days after his zoster vaccine

## 2011-04-02 NOTE — Telephone Encounter (Signed)
I have left a message for the patient to call back to discuss Remicade infusion scheduled for 05/04/11 10:00

## 2011-04-06 NOTE — Telephone Encounter (Signed)
Patient advised.

## 2011-04-10 ENCOUNTER — Encounter: Payer: Self-pay | Admitting: Endocrinology

## 2011-04-10 ENCOUNTER — Other Ambulatory Visit: Payer: Self-pay | Admitting: Neurology

## 2011-04-10 ENCOUNTER — Other Ambulatory Visit (INDEPENDENT_AMBULATORY_CARE_PROVIDER_SITE_OTHER): Payer: 59

## 2011-04-10 ENCOUNTER — Ambulatory Visit (INDEPENDENT_AMBULATORY_CARE_PROVIDER_SITE_OTHER): Payer: 59 | Admitting: Endocrinology

## 2011-04-10 DIAGNOSIS — D849 Immunodeficiency, unspecified: Secondary | ICD-10-CM

## 2011-04-10 DIAGNOSIS — I10 Essential (primary) hypertension: Secondary | ICD-10-CM

## 2011-04-10 DIAGNOSIS — R61 Generalized hyperhidrosis: Secondary | ICD-10-CM

## 2011-04-10 DIAGNOSIS — D899 Disorder involving the immune mechanism, unspecified: Secondary | ICD-10-CM

## 2011-04-10 LAB — BASIC METABOLIC PANEL
BUN: 13 mg/dL (ref 6–23)
Chloride: 103 mEq/L (ref 96–112)
Creatinine, Ser: 0.8 mg/dL (ref 0.4–1.5)
Glucose, Bld: 96 mg/dL (ref 70–99)

## 2011-04-10 LAB — CBC WITH DIFFERENTIAL/PLATELET
Basophils Absolute: 0 10*3/uL (ref 0.0–0.1)
Eosinophils Absolute: 0.1 10*3/uL (ref 0.0–0.7)
Eosinophils Relative: 0.9 % (ref 0.0–5.0)
Lymphs Abs: 2.3 10*3/uL (ref 0.7–4.0)
MCV: 95.2 fl (ref 78.0–100.0)
Monocytes Absolute: 0.6 10*3/uL (ref 0.1–1.0)
Neutrophils Relative %: 57.6 % (ref 43.0–77.0)
Platelets: 213 10*3/uL (ref 150.0–400.0)
RDW: 15.8 % — ABNORMAL HIGH (ref 11.5–14.6)
WBC: 7.1 10*3/uL (ref 4.5–10.5)

## 2011-04-10 LAB — HEPATIC FUNCTION PANEL
ALT: 48 U/L (ref 0–53)
Bilirubin, Direct: 0.2 mg/dL (ref 0.0–0.3)
Total Bilirubin: 0.6 mg/dL (ref 0.3–1.2)

## 2011-04-10 NOTE — Progress Notes (Signed)
Subjective:    Patient ID: [name] [name], male    DOB: Sep 26, 1975, 35 y.o.   MRN: [id number]  HPI Pt states a few days of alternating mild sweating, throughout the body, and assoc chills. He is temporarily off the remicade, due to the recent zostavax.   No change in chronic diarrhea.   Past Medical History  Diagnosis Date  . Obstructive sleep apnea (adult) (pediatric)   . Anxiety and depression   . Essential hypertension, benign   . Obesity, unspecified   . Herpes zoster infection 07/2010, 12/2010    with post-herpetic neuralgia  . Vitamin D deficiency   . Crohn's disease   . Hyperlipidemia   . CMV (cytomegalovirus infection) 10/28/2010  . Nephrolithiasis   . Fistula, perirectal     history of    Past Surgical History  Procedure Date  . Small intestine surgery     1 ft removed, ileo-cecectomy  . Hypospadias correction     Childhood   . Upper gastrointestinal endoscopy 10/15/2010    w/biopsy, mild gastritis and duodenitis  . Colonoscopy 10/14/2007    crohn's colitis, aphthous ulcers, mild anal stenosis    History   Social History  . Marital Status: Legally Separated    Spouse Name: N/A    Number of Children: 2  . 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 Smoking status: Never Smoker   . Smokeless tobacco: Never Used  . Alcohol Use: No  . Drug Use: No  . Sexually Active: Not Currently -- Male partner(s)   Other Topics Concern  . Not on file   Social History Narrative   HSG, [employer] [facility] [place] - 2 years. Married '99 - 19yr/divorced. 2 sons - '98, '00 split custody. 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 No history of physical or sexual abuse.     Current Outpatient Prescriptions on File Prior to Visit  Medication Sig Dispense Refill  . acetaminophen (TYLENOL) 325 MG tablet Take 650 mg by mouth as needed.        . gabapentin (NEURONTIN) 300 MG capsule Take 4 capsules  (1,200 mg total) by mouth 3 (three) times daily. increase as directed  180 capsule  5  . inFLIXimab (REMICADE) 100 MG injection weight:      228 LBS      Dx: 555.2 Crohn's disease  Dosing instructions:   5  mg/kg     every  6   weeks  Pre medication: BENADRYL 25-50 MG BY MOUTH PRIOR TO INFUSION, TYLENOL 625 MG BY MOUTH PRIOR TO INFUSION  Date of Last TB Skin Test: 06/25/10  Results: NEGATIVE    1 each  2  . lactobacillus rhamnosus, GG, (CULTURELLE) 10 B CELL capsule Take 1 capsule by mouth daily.        .Marland Kitchenlisinopril (PRINIVIL,ZESTRIL) 10 MG tablet Take 1 tablet (10 mg total) by mouth daily.  30 tablet  11  . oxyCODONE-acetaminophen (PERCOCET) 10-325 MG per tablet Take 1 tablet by mouth every 6 (six) hours as needed for pain.  60 tablet  0  . PARoxetine (PAXIL) 40 MG tablet Take 40 mg by mouth every morning.        . promethazine (PHENERGAN) 25 MG tablet prn      . zoster vaccine live, PF, (ZOSTAVAX) 132023UNT/0.65ML injection Inject 19,400 Units into the skin once.  1 vial  0  No Known Allergies  Family History  Problem Relation Age of Onset  . Heart disease Father     CAD/MI  . Hyperlipidemia Father   . Hypertension Father   . Gout Father   . Sleep apnea Father   . Colon cancer Neg Hx   . Diabetes Neg Hx   . COPD Neg Hx   . Obesity Mother   . Stroke Maternal Grandfather   . Allergies Sister   . Allergies Mother   . Asthma Sister   . Cancer Mother     BP 152/98  Pulse 73  Temp(Src) 98.3 F (36.8 C) (Oral)  Ht 5' 9"  (1.753 m)  Wt 228 lb (103.42 kg)  BMI 33.67 kg/m2  SpO2 97%     Review of Systems Denies chills/n/v/brbpr/hematuria/cough/dysuria/abd pain.      Objective:   Physical Exam VITAL SIGNS:  See vs page GENERAL: no distress head: no deformity eyes: no periorbital swelling, no proptosis external nose and ears are normal mouth: no lesion seen Both eac's and tm's are normal NECK: There is no palpable thyroid enlargement.  No thyroid nodule is palpable.   No palpable lymphadenopathy at the anterior neck. LUNGS:  Clear to auscultation HEART:  Regular rate and rhythm without murmurs noted. Normal S1,S2.   ABDOMEN: abdomen is soft, nontender.  no hepatosplenomegaly.   not distended.  no hernia Ext: no edema.   Lab Results  Component Value Date   WBC 7.1 04/10/2011   HGB 14.7 04/10/2011   HCT 43.4 04/10/2011   PLT 213.0 04/10/2011   GLUCOSE 96 04/10/2011   ALT 48 04/10/2011   AST 56* 04/10/2011   NA 141 04/10/2011   K 4.2 04/10/2011   CL 103 04/10/2011   CREATININE 0.8 04/10/2011   BUN 13 04/10/2011   CO2 27 04/10/2011   TSH 2.42 04/10/2011      Assessment & Plan:  Sweating, uncertain etiology, new elev hepatic transaminases, improved Diarrhea, persistent, uncertain etiology

## 2011-04-10 NOTE — Patient Instructions (Addendum)
blood tests are being requested for you today.  please call [phone] to hear your test results.  You will be prompted to enter the 9-digit "MRN" number that appears at the top left of this page, followed by #.  Then you will hear the message. Please have your blood pressure rechecked with dr Linda Hedges soon. I hope you feel better soon.  If you don't feel better by next week, please see dr Linda Hedges. The state of at least three ongoing medical problems is addressed today: (update: i left message on phone-tree:  rx as we discussed)

## 2011-04-10 NOTE — Telephone Encounter (Signed)
Pt would like a refill of the Percocet. Also, pt states the Neurontin isn't helping.

## 2011-04-13 ENCOUNTER — Other Ambulatory Visit: Payer: Self-pay | Admitting: Neurology

## 2011-04-13 DIAGNOSIS — B0229 Other postherpetic nervous system involvement: Secondary | ICD-10-CM

## 2011-04-13 MED ORDER — OXYCODONE-ACETAMINOPHEN 10-325 MG PO TABS: 1.0000 | ORAL_TABLET | Freq: Four times a day (QID) | ORAL | Status: DC | PRN

## 2011-04-13 MED ORDER — PREGABALIN 50 MG PO CAPS: 50.0000 mg | ORAL_CAPSULE | Freq: Three times a day (TID) | ORAL | Status: DC

## 2011-04-13 NOTE — Telephone Encounter (Signed)
Addended by: Deeann Cree on: 04/13/2011 02:56 PM   Modules accepted: Orders

## 2011-04-13 NOTE — Telephone Encounter (Signed)
Spoke with pt he states he is taking 1263m tid of the neurontin, so I called in the Lyrica 558mtid to [facility] [place].  He will come by today to pick up the percocet.

## 2011-04-13 NOTE — Telephone Encounter (Signed)
Tiffany -- I tried to call Haleem, but got a message.  Could you call him and find out if he is at 1200 tid of the neurontin.  If so we can add Lyrica 50 tid.  Also we can call in a refill for his Percocet.  Thx.

## 2011-04-21 ENCOUNTER — Other Ambulatory Visit: Payer: Self-pay | Admitting: Internal Medicine

## 2011-04-21 DIAGNOSIS — K5090 Crohn's disease, unspecified, without complications: Secondary | ICD-10-CM

## 2011-04-24 ENCOUNTER — Other Ambulatory Visit: Payer: Self-pay | Admitting: Internal Medicine

## 2011-04-24 NOTE — Telephone Encounter (Signed)
Ok to renew Paxil at same dose and frequency for 6 months total

## 2011-04-24 NOTE — Telephone Encounter (Signed)
Received a call from Judson Roch at [facility], questioning if patient had is shingles vaccine. Remicade appointment will be canceled until they find out if vaccine has been given. Called the patient and left a message on voicemail to return my call.

## 2011-04-25 ENCOUNTER — Other Ambulatory Visit (HOSPITAL_COMMUNITY): Payer: Self-pay | Admitting: *Deleted

## 2011-04-30 ENCOUNTER — Other Ambulatory Visit (HOSPITAL_COMMUNITY): Payer: Self-pay | Admitting: General Practice

## 2011-04-30 ENCOUNTER — Telehealth: Payer: Self-pay | Admitting: Internal Medicine

## 2011-04-30 NOTE — Telephone Encounter (Signed)
All questions answered

## 2011-05-01 ENCOUNTER — Other Ambulatory Visit (HOSPITAL_COMMUNITY): Payer: Self-pay | Admitting: *Deleted

## 2011-05-04 ENCOUNTER — Encounter (HOSPITAL_COMMUNITY)
Admission: RE | Admit: 2011-05-04 | Discharge: 2011-05-04 | Disposition: A | Discharge status: Home / Self Care | Payer: 59 | Source: Ambulatory Visit | Attending: Internal Medicine | Admitting: Internal Medicine

## 2011-05-04 DIAGNOSIS — K5090 Crohn's disease, unspecified, without complications: Secondary | ICD-10-CM

## 2011-05-04 MED ORDER — DIPHENHYDRAMINE HCL 25 MG PO CAPS: 50.0000 mg | ORAL_CAPSULE | ORAL | Status: DC

## 2011-05-04 MED ORDER — DIPHENHYDRAMINE HCL 25 MG PO CAPS
50.0000 mg | ORAL_CAPSULE | Freq: Once | ORAL | Status: AC
  Administered 2011-05-04: 50 mg via ORAL

## 2011-05-04 MED ORDER — ACETAMINOPHEN ER 650 MG PO TBCR
650.0000 mg | EXTENDED_RELEASE_TABLET | ORAL | Status: DC
  Administered 2011-05-04: 650 mg via ORAL

## 2011-05-04 MED ORDER — SODIUM CHLORIDE 0.9 % IV SOLN
5.0000 mg/kg | INTRAVENOUS | Status: DC
  Administered 2011-05-04: 500 mg via INTRAVENOUS
  Filled 2011-05-04: qty 50

## 2011-05-04 MED ORDER — ACETAMINOPHEN 325 MG PO TABS
650.0000 mg | ORAL_TABLET | ORAL | Status: DC
  Administered 2011-05-04: 650 mg via ORAL

## 2011-05-07 ENCOUNTER — Ambulatory Visit (INDEPENDENT_AMBULATORY_CARE_PROVIDER_SITE_OTHER): Payer: 59 | Admitting: Neurology

## 2011-05-07 ENCOUNTER — Encounter: Payer: Self-pay | Admitting: Neurology

## 2011-05-07 VITALS — BP 128/90 | HR 76 | Ht 69.0 in | Wt 231.0 lb

## 2011-05-07 DIAGNOSIS — B0229 Other postherpetic nervous system involvement: Secondary | ICD-10-CM

## 2011-05-07 MED ORDER — OXYCODONE-ACETAMINOPHEN 10-325 MG PO TABS: 1.0000 | ORAL_TABLET | Freq: Four times a day (QID) | ORAL | Status: DC | PRN

## 2011-05-07 MED ORDER — PREGABALIN 50 MG PO CAPS: ORAL_CAPSULE | ORAL | Status: DC

## 2011-05-07 NOTE — Progress Notes (Signed)
Dear Dr. Linda Hedges and Dr. Carlean Purl,  I saw  [name] [name] back in [facility] for his problem with post herpetic neuralgia in the left T6 distribution.  When I first saw him I increased his Neurontin to 2743m a day and then started him on Lyrica and increased him to 580mthree times a day.  He does not feel any relief from the medication.  He feels the Percocet gives him the most relief.  The Lyrica is also costly, which may make it prohibitive over the long term.  Medical history, social history, family history, medications and allergies were reviewed and have not changed since the last clinic visit unless noted above.  ROS:  13 systems were reviewed and are notable for chronic abdominal pain, diarrhea from his Crohn's.  His fatigue from the Lyrica has resolved.    All other review of systems are unremarkable.   Impression/Recs:  Left T6 Post-herpetic neuralgia, poorly controlled.  I am going to increase his Lyrica to a target dose of 15041mid.  If he gets unacceptable side effects, then we either need to choose between a trial of Trileptal or a long acting narcotic such as methadone.  I think the methadone is probably more likely to help given his response to Percocet.  He will let me know how the trial of Lyrica goes.  I have told him he can stop the Neurontin and continue off it unless he notices an exacerbation of the pain.   We will see the patient back in 6 weeks.  MatKavin LeechnJacelyn GripD [facility], [place]

## 2011-05-07 NOTE — Patient Instructions (Signed)
Titration schedule for Lyrica 3m caps  in 5 days(after being off the gabapentin) increase to 1 cap in the am., 1 at midday, 2 at night then in 5 days increase to 2 cap in the am., 1 at midday, 2 at night then in 5 days increase to 2 caps three times per day then in 5 days increase to 2 cap in the am., 2 at midday, 3 at night then in 5 days increase to 3 cap in the am., 2 at midday, 3 at night then in 5 days increase to 3 caps three times per day from then on.

## 2011-05-22 ENCOUNTER — Telehealth: Payer: Self-pay | Admitting: Neurology

## 2011-05-22 NOTE — Telephone Encounter (Signed)
Pt would like a refill on Percocet. Picked up Lyrica rx ($127) but he hasn't noticed any difference.

## 2011-05-22 NOTE — Telephone Encounter (Signed)
ok to refill.  tiffany cld you take care of.

## 2011-05-26 ENCOUNTER — Other Ambulatory Visit: Payer: Self-pay

## 2011-05-26 DIAGNOSIS — B0229 Other postherpetic nervous system involvement: Secondary | ICD-10-CM

## 2011-05-26 MED ORDER — OXYCODONE-ACETAMINOPHEN 10-325 MG PO TABS: 1.0000 | ORAL_TABLET | Freq: Four times a day (QID) | ORAL | Status: DC | PRN

## 2011-05-27 NOTE — Telephone Encounter (Signed)
Med refilled and pt came to pick up rx

## 2011-06-11 ENCOUNTER — Encounter (HOSPITAL_COMMUNITY)
Admission: RE | Admit: 2011-06-11 | Discharge: 2011-06-11 | Disposition: A | Discharge status: Home / Self Care | Payer: 59 | Source: Ambulatory Visit | Attending: Internal Medicine | Admitting: Internal Medicine

## 2011-06-11 ENCOUNTER — Encounter (HOSPITAL_COMMUNITY): Payer: Self-pay

## 2011-06-11 DIAGNOSIS — K5090 Crohn's disease, unspecified, without complications: Secondary | ICD-10-CM | POA: Insufficient documentation

## 2011-06-11 MED ORDER — ACETAMINOPHEN 325 MG PO TABS
650.0000 mg | ORAL_TABLET | ORAL | Status: DC
  Administered 2011-06-11: 650 mg via ORAL

## 2011-06-11 MED ORDER — DIPHENHYDRAMINE HCL 25 MG PO CAPS
50.0000 mg | ORAL_CAPSULE | ORAL | Status: DC
  Administered 2011-06-11: 50 mg via ORAL

## 2011-06-11 MED ORDER — SODIUM CHLORIDE 0.9 % IV SOLN
5.0000 mg/kg | INTRAVENOUS | Status: DC
  Administered 2011-06-11: 500 mg via INTRAVENOUS
  Filled 2011-06-11: qty 50

## 2011-06-11 MED ORDER — SODIUM CHLORIDE 0.9 % IV SOLN
Freq: Once | INTRAVENOUS | Status: AC
  Administered 2011-06-11: 14:00:00 via INTRAVENOUS

## 2011-06-25 ENCOUNTER — Encounter: Payer: Self-pay | Admitting: Neurology

## 2011-06-25 ENCOUNTER — Ambulatory Visit (INDEPENDENT_AMBULATORY_CARE_PROVIDER_SITE_OTHER): Payer: 59 | Admitting: Neurology

## 2011-06-25 VITALS — BP 160/98 | HR 64 | Ht 69.0 in | Wt 236.0 lb

## 2011-06-25 DIAGNOSIS — B0229 Other postherpetic nervous system involvement: Secondary | ICD-10-CM

## 2011-06-25 MED ORDER — METHADONE HCL 5 MG PO TABS
ORAL_TABLET | ORAL | Status: DC
Start: 2011-06-25 — End: 2011-06-25

## 2011-06-25 MED ORDER — OXYCODONE-ACETAMINOPHEN 10-325 MG PO TABS: 1.0000 | ORAL_TABLET | Freq: Four times a day (QID) | ORAL | Status: DC | PRN

## 2011-06-25 MED ORDER — METHADONE HCL 5 MG PO TABS
ORAL_TABLET | ORAL | Status: DC
Start: 2011-06-25 — End: 2011-07-10

## 2011-06-25 NOTE — Progress Notes (Signed)
Dear Dr. Linda Hedges,  I saw  [name] [name] back in [facility] for his problem with post-herpetic neuralgia.  As you may recall, he is a 36 y.o. year old male with a history of IBD and Zoster of the T5 dermatome on the left who continues to have refractory pain that has been unresponsive to both high doses of neurontin as well as Lyrica.    He continues to get some response from 10-20 mg dosages of oxycodone/percocet.   Medical history, social history, and family history were reviewed and have not changed since the last clinic visit.  Current Outpatient Prescriptions on File Prior to Visit  Medication Sig Dispense Refill  . acetaminophen (TYLENOL) 325 MG tablet Take 650 mg by mouth as needed.        . inFLIXimab (REMICADE) 100 MG injection weight:      228 LBS      Dx: 555.2 Crohn's disease  Dosing instructions:   5  mg/kg     every  6   weeks  Pre medication: BENADRYL 25-50 MG BY MOUTH PRIOR TO INFUSION, TYLENOL 625 MG BY MOUTH PRIOR TO INFUSION  Date of Last TB Skin Test: 06/25/10  Results: NEGATIVE    1 each  2  . lisinopril (PRINIVIL,ZESTRIL) 10 MG tablet Take 1 tablet (10 mg total) by mouth daily.  30 tablet  11  . PARoxetine (PAXIL) 40 MG tablet TAKE 1 TABLET DAILY  90 tablet  1  . pregabalin (LYRICA) 50 MG capsule increase to 3 caps three times a day as directed.  270 capsule  3  . promethazine (PHENERGAN) 25 MG tablet prn      . lactobacillus rhamnosus, GG, (CULTURELLE) 10 B CELL capsule Take 1 capsule by mouth daily.          No Known Allergies  ROS:  13 systems were reviewed and are unremarkable.  Exam: . Filed Vitals:   06/25/11 1056  BP: 160/98  Pulse: 64  Height: 5' 9"  (1.753 m)  Weight: 236 lb (107.049 kg)   Impression:  Post-herpetic neuralgia.  Recommendations:  I am going to start he patient on methadone which I prefer due to its action on the NMDA receptor.  I am starting 2.98m daily to increase to 2.5 bid.  He will call me after several weeks and we  will increase the dose from there.  He is going to wean off the pregabalin over two weeks.  He will continue to use the Percocet but I have asked him to only use two pills at once at most two times per day because of the Tylenol dosage.  If he needs another shorter acting medication I would likely use hydromorphone.  We will see the patient back in 3  months.  MKavin LeechWJacelyn Grip MD [facility], [place]

## 2011-06-26 ENCOUNTER — Ambulatory Visit: Payer: 59 | Admitting: Neurology

## 2011-07-10 ENCOUNTER — Other Ambulatory Visit: Payer: Self-pay | Admitting: Neurology

## 2011-07-10 ENCOUNTER — Telehealth: Payer: Self-pay | Admitting: Neurology

## 2011-07-10 DIAGNOSIS — B0229 Other postherpetic nervous system involvement: Secondary | ICD-10-CM

## 2011-07-10 MED ORDER — METHADONE HCL 5 MG PO TABS: ORAL_TABLET | ORAL | Status: DC

## 2011-07-10 MED ORDER — OXYCODONE-ACETAMINOPHEN 10-325 MG PO TABS: 1.0000 | ORAL_TABLET | Freq: Four times a day (QID) | ORAL | Status: DC | PRN

## 2011-07-10 NOTE — Telephone Encounter (Signed)
[name] [name] - if you can find out if he is tolerating the methadone?  If so and his pain is not controlled I would increase his dose to 5 bid.  We can also refill his percocet.  He will of course have to come here to get the prescriptions.

## 2011-07-10 NOTE — Telephone Encounter (Signed)
Called patient. States he is tolerating the Methadone without problems. Will increase that to 5 mg BID. He will come to the office between 4:30 and 5:00 to pick up both prescriptions.

## 2011-07-10 NOTE — Telephone Encounter (Signed)
The patient presented to the office to speak with the nurse. I did talk to the patient who came to request a refill on his Percocet. He states he has 9 pills left and knows that it is a long weekend coming up. *Percocet was last filled on January 3rd.* Reports that he has completely weaned off of the Lyrica. He is taking the Methadone 2.5 mg BID as Dr. [name] prescribed. I told the patient that I would check with Dr. [name] and we would call him back. He asked that we call him on his cell phone.  **Dr. [name], please advise refill on Percocet. Thank you.

## 2011-07-21 ENCOUNTER — Other Ambulatory Visit: Payer: Self-pay

## 2011-07-21 DIAGNOSIS — K5090 Crohn's disease, unspecified, without complications: Secondary | ICD-10-CM

## 2011-07-22 ENCOUNTER — Encounter (HOSPITAL_COMMUNITY)
Admission: RE | Admit: 2011-07-22 | Discharge: 2011-07-22 | Disposition: A | Discharge status: Home / Self Care | Payer: 59 | Source: Ambulatory Visit | Attending: Internal Medicine | Admitting: Internal Medicine

## 2011-07-22 DIAGNOSIS — K5090 Crohn's disease, unspecified, without complications: Secondary | ICD-10-CM | POA: Insufficient documentation

## 2011-07-22 MED ORDER — SODIUM CHLORIDE 0.9 % IV SOLN
INTRAVENOUS | Status: DC
  Administered 2011-07-22: 13:00:00 via INTRAVENOUS

## 2011-07-22 MED ORDER — DIPHENHYDRAMINE HCL 25 MG PO TABS
50.0000 mg | ORAL_TABLET | ORAL | Status: DC
  Administered 2011-07-22: 50 mg via ORAL

## 2011-07-22 MED ORDER — ACETAMINOPHEN ER 650 MG PO TBCR
650.0000 mg | EXTENDED_RELEASE_TABLET | ORAL | Status: DC
  Administered 2011-07-22: 650 mg via ORAL

## 2011-07-22 MED ORDER — SODIUM CHLORIDE 0.9 % IV SOLN
5.0000 mg/kg | INTRAVENOUS | Status: DC
  Administered 2011-07-22: 500 mg via INTRAVENOUS
  Filled 2011-07-22: qty 50

## 2011-07-23 ENCOUNTER — Encounter (HOSPITAL_COMMUNITY): Payer: 59

## 2011-07-23 MED FILL — Diphenhydramine HCl Cap 25 MG: ORAL | Qty: 1 | Status: AC

## 2011-07-24 ENCOUNTER — Other Ambulatory Visit: Payer: Self-pay | Admitting: Neurology

## 2011-07-24 DIAGNOSIS — B0229 Other postherpetic nervous system involvement: Secondary | ICD-10-CM

## 2011-07-24 MED ORDER — OXYCODONE-ACETAMINOPHEN 10-325 MG PO TABS: 1.0000 | ORAL_TABLET | Freq: Four times a day (QID) | ORAL | Status: DC | PRN

## 2011-07-24 MED ORDER — METHADONE HCL 5 MG PO TABS: ORAL_TABLET | ORAL | Status: DC

## 2011-07-24 NOTE — Telephone Encounter (Signed)
Patient requesting refill on Percocet. Last filled on 0/18/13.

## 2011-07-24 NOTE — Telephone Encounter (Signed)
Called and spoke with the patient. Will come about 4 pm today to pick up Percocet prescription. He reports that he is taking the Methadone 5 mg BID and is tolerating the medication but that it is not really helping control or reduce his pain. I told him that I would let Dr. [name] know. **Dr. [name], Juluis Rainier...Marland KitchenMarland Kitchen

## 2011-07-24 NOTE — Telephone Encounter (Signed)
That is fine.  Can you find out if the methadone is helping.  He should be on 81m bid.

## 2011-07-24 NOTE — Telephone Encounter (Signed)
Pt would like percocet refilled.

## 2011-08-05 ENCOUNTER — Ambulatory Visit (INDEPENDENT_AMBULATORY_CARE_PROVIDER_SITE_OTHER): Payer: 59 | Admitting: Internal Medicine

## 2011-08-05 ENCOUNTER — Encounter: Payer: Self-pay | Admitting: *Deleted

## 2011-08-05 ENCOUNTER — Encounter: Payer: Self-pay | Admitting: Internal Medicine

## 2011-08-05 VITALS — BP 124/92 | HR 61 | Temp 98.3°F

## 2011-08-05 DIAGNOSIS — J0100 Acute maxillary sinusitis, unspecified: Secondary | ICD-10-CM

## 2011-08-05 DIAGNOSIS — D849 Immunodeficiency, unspecified: Secondary | ICD-10-CM

## 2011-08-05 DIAGNOSIS — J069 Acute upper respiratory infection, unspecified: Secondary | ICD-10-CM

## 2011-08-05 DIAGNOSIS — D899 Disorder involving the immune mechanism, unspecified: Secondary | ICD-10-CM

## 2011-08-05 MED ORDER — AMOXICILLIN-POT CLAVULANATE 875-125 MG PO TABS: 1.0000 | ORAL_TABLET | Freq: Two times a day (BID) | ORAL | Status: AC

## 2011-08-05 MED ORDER — LEVOFLOXACIN 500 MG PO TABS: 500.0000 mg | ORAL_TABLET | Freq: Every day | ORAL | Status: DC

## 2011-08-05 NOTE — Progress Notes (Signed)
  Subjective:    HPI  complains of head cold symptoms  Onset >1 week ago, wax/wane symptoms  associated with rhinorrhea, sneezing, sore throat, mild headache and low grade fever Also myalgias, sinus pressure and mild chest congestion > cream thick and occasionally blood-tinged sputum Min relief with OTC meds Precipitated by sick contacts  Past Medical History  Diagnosis Date  . Obstructive sleep apnea (adult) (pediatric)   . Anxiety and depression   . Essential hypertension, benign   . Obesity, unspecified   . Herpes zoster infection 07/2010, 12/2010    with post-herpetic neuralgia  . Vitamin d deficiency   . Crohn's disease   . Hyperlipidemia   . CMV (cytomegalovirus infection) 10/28/2010  . Nephrolithiasis   . Fistula, perirectal     history of    Review of Systems Constitutional: No fever or night sweats, no unexpected weight change Pulmonary: No pleurisy or hemoptysis Cardiovascular: No chest pain or palpitations     Objective:   Physical Exam BP 124/92  Pulse 61  Temp(Src) 98.3 F (36.8 C) (Oral)  SpO2 96% GEN: mildly ill appearing and audible head congestion HENT: NCAT, mild sinus tenderness bilaterally, nares with clear discharge, oropharynx mild erythema, no exudate Eyes: Vision grossly intact, no conjunctivitis Lungs: Clear to auscultation without rhonchi or wheeze, no increased work of breathing Cardiovascular: Regular rate and rhythm, no bilateral edema  Lab Results  Component Value Date   WBC 7.1 04/10/2011   HGB 14.7 04/10/2011   HCT 43.4 04/10/2011   PLT 213.0 04/10/2011   GLUCOSE 96 04/10/2011   ALT 48 04/10/2011   AST 56* 04/10/2011   NA 141 04/10/2011   K 4.2 04/10/2011   CL 103 04/10/2011   CREATININE 0.8 04/10/2011   BUN 13 04/10/2011   CO2 27 04/10/2011   TSH 2.42 04/10/2011       Assessment & Plan:  Viral URI >>maxillary sinusitis Immunosuppression due to Remicade treatment of Crohn's disease   Empiric antibiotics prescribed due  to symptom duration greater than 7 days and immunosuppression/comorbid dz prescriptions done Symptomatic care with Tylenol or Advil, decongestant, hydration and rest -  salt gargle advised as needed Work note x 48h

## 2011-08-05 NOTE — Patient Instructions (Addendum)
It was good to see you today. Augmentin antibiotics 2x/daily for one week Continue your over-the-counter Tylenol and decongestant combination as needed Hydrate, rest and call if symptoms worse or unimproved in next 7-10 days on treatment Work note provided for next 2 days as discussed

## 2011-08-06 ENCOUNTER — Telehealth: Payer: Self-pay | Admitting: Neurology

## 2011-08-06 NOTE — Telephone Encounter (Signed)
He can get them tomorrow.  Or between 5 and 6 today.

## 2011-08-06 NOTE — Telephone Encounter (Signed)
Pt called for refill of percocet. He also states that he thinks the methadone is beginning to work but he wonders if he can increase the dose?

## 2011-08-06 NOTE — Telephone Encounter (Signed)
He didn't give a specific time, but he has already called back once today so I think ASAP.

## 2011-08-06 NOTE — Telephone Encounter (Signed)
yes we can increase the dose.  When does he want to pick up the prescriptions?

## 2011-08-07 ENCOUNTER — Other Ambulatory Visit: Payer: Self-pay | Admitting: Neurology

## 2011-08-07 DIAGNOSIS — B0229 Other postherpetic nervous system involvement: Secondary | ICD-10-CM

## 2011-08-07 MED ORDER — METHADONE HCL 5 MG PO TABS: ORAL_TABLET | ORAL | Status: DC

## 2011-08-07 MED ORDER — OXYCODONE-ACETAMINOPHEN 10-325 MG PO TABS: 1.0000 | ORAL_TABLET | Freq: Four times a day (QID) | ORAL | Status: DC | PRN

## 2011-08-07 NOTE — Telephone Encounter (Signed)
increased methadone to 10 tid and have refilled percocet script.  patient is getting some effect from his current dose.

## 2011-08-07 NOTE — Telephone Encounter (Signed)
Patient in to pick up script.

## 2011-08-07 NOTE — Telephone Encounter (Signed)
LM on voicemail informing pt he can pick up his rx any time today. Advised him to call us back if he had any questions.

## 2011-08-21 ENCOUNTER — Other Ambulatory Visit: Payer: Self-pay | Admitting: Neurology

## 2011-08-21 DIAGNOSIS — B0229 Other postherpetic nervous system involvement: Secondary | ICD-10-CM

## 2011-08-21 MED ORDER — OXYCODONE-ACETAMINOPHEN 10-325 MG PO TABS: 1.0000 | ORAL_TABLET | Freq: Four times a day (QID) | ORAL | Status: DC | PRN

## 2011-08-21 NOTE — Telephone Encounter (Signed)
ok.  before I write the prescriptions I will just quickly need to talk to him.  Let me know what time he wants to come in.

## 2011-08-21 NOTE — Telephone Encounter (Signed)
Left a message on the patient's mobile phone to call the office.

## 2011-08-21 NOTE — Telephone Encounter (Signed)
Spoke with the patient at his work number. Aware that the script is ready for pick up. Patient reports the Methadone is helping since the dose was increased. No other issues.

## 2011-08-21 NOTE — Telephone Encounter (Signed)
Pt called for refill of Percocet. Pt states that he has been taking Methodone as you prescribed and it seems to be working. He wants to pick up his refill this afternoon.

## 2011-09-02 ENCOUNTER — Encounter (HOSPITAL_COMMUNITY)
Admission: RE | Admit: 2011-09-02 | Discharge: 2011-09-02 | Disposition: A | Discharge status: Home / Self Care | Payer: 59 | Source: Ambulatory Visit | Attending: Internal Medicine | Admitting: Internal Medicine

## 2011-09-02 ENCOUNTER — Encounter (HOSPITAL_COMMUNITY): Payer: Self-pay

## 2011-09-02 VITALS — BP 115/80 | HR 62 | Temp 98.5°F | Resp 15 | Wt 227.0 lb

## 2011-09-02 DIAGNOSIS — K5090 Crohn's disease, unspecified, without complications: Secondary | ICD-10-CM | POA: Insufficient documentation

## 2011-09-02 MED ORDER — SODIUM CHLORIDE 0.9 % IV SOLN
5.0000 mg/kg | INTRAVENOUS | Status: DC
  Administered 2011-09-02: 500 mg via INTRAVENOUS
  Filled 2011-09-02: qty 50

## 2011-09-02 MED ORDER — ACETAMINOPHEN ER 650 MG PO TBCR: 650.0000 mg | EXTENDED_RELEASE_TABLET | ORAL | Status: DC

## 2011-09-02 MED ORDER — SODIUM CHLORIDE 0.9 % IV SOLN
INTRAVENOUS | Status: DC
  Administered 2011-09-02: 14:00:00 via INTRAVENOUS

## 2011-09-02 MED ORDER — ACETAMINOPHEN 325 MG PO TABS
650.0000 mg | ORAL_TABLET | Freq: Once | ORAL | Status: AC
  Administered 2011-09-02: 650 mg via ORAL

## 2011-09-02 MED ORDER — DIPHENHYDRAMINE HCL 25 MG PO TABS
50.0000 mg | ORAL_TABLET | ORAL | Status: DC
  Administered 2011-09-02: 50 mg via ORAL

## 2011-09-02 NOTE — Discharge Instructions (Signed)
Call MD for any problems  Return on Apr 24 , 2013 at 1p

## 2011-09-03 ENCOUNTER — Encounter (HOSPITAL_COMMUNITY): Payer: 59

## 2011-09-07 MED FILL — Diphenhydramine HCl Cap 25 MG: ORAL | Qty: 1 | Status: AC

## 2011-09-11 ENCOUNTER — Telehealth: Payer: Self-pay

## 2011-09-11 NOTE — Telephone Encounter (Signed)
Pt calling for his monthly refill of percocet.  I let him know you are out of town until Monday, and we would call him once Rx was ready.

## 2011-09-14 ENCOUNTER — Other Ambulatory Visit: Payer: Self-pay | Admitting: Neurology

## 2011-09-14 DIAGNOSIS — B0229 Other postherpetic nervous system involvement: Secondary | ICD-10-CM

## 2011-09-14 MED ORDER — OXYCODONE-ACETAMINOPHEN 10-325 MG PO TABS: 1.0000 | ORAL_TABLET | Freq: Four times a day (QID) | ORAL | Status: DC | PRN

## 2011-09-14 NOTE — Telephone Encounter (Signed)
Rx ready, pt will come to pick up.

## 2011-09-24 ENCOUNTER — Ambulatory Visit: Payer: 59 | Admitting: Neurology

## 2011-09-25 ENCOUNTER — Emergency Department (INDEPENDENT_AMBULATORY_CARE_PROVIDER_SITE_OTHER)
Admission: EM | Admit: 2011-09-25 | Discharge: 2011-09-25 | Disposition: A | Discharge status: Home / Self Care | Payer: 59 | Source: Home / Self Care | Attending: Emergency Medicine | Admitting: Emergency Medicine

## 2011-09-25 ENCOUNTER — Emergency Department (INDEPENDENT_AMBULATORY_CARE_PROVIDER_SITE_OTHER): Payer: 59

## 2011-09-25 ENCOUNTER — Encounter (HOSPITAL_COMMUNITY): Payer: Self-pay

## 2011-09-25 DIAGNOSIS — A0811 Acute gastroenteropathy due to Norwalk agent: Secondary | ICD-10-CM

## 2011-09-25 DIAGNOSIS — K5090 Crohn's disease, unspecified, without complications: Secondary | ICD-10-CM

## 2011-09-25 LAB — CBC
MCHC: 34.7 g/dL (ref 30.0–36.0)
Platelets: 225 10*3/uL (ref 150–400)
RDW: 14.3 % (ref 11.5–15.5)
WBC: 11.1 10*3/uL — ABNORMAL HIGH (ref 4.0–10.5)

## 2011-09-25 LAB — DIFFERENTIAL
Basophils Absolute: 0 10*3/uL (ref 0.0–0.1)
Basophils Relative: 0 % (ref 0–1)
Lymphocytes Relative: 30 % (ref 12–46)
Monocytes Absolute: 1 10*3/uL (ref 0.1–1.0)
Neutro Abs: 6.6 10*3/uL (ref 1.7–7.7)
Neutrophils Relative %: 59 % (ref 43–77)

## 2011-09-25 LAB — COMPREHENSIVE METABOLIC PANEL
ALT: 38 U/L (ref 0–53)
Albumin: 4.7 g/dL (ref 3.5–5.2)
Alkaline Phosphatase: 92 U/L (ref 39–117)
Chloride: 100 mEq/L (ref 96–112)
Potassium: 3.6 mEq/L (ref 3.5–5.1)
Sodium: 138 mEq/L (ref 135–145)
Total Bilirubin: 0.5 mg/dL (ref 0.3–1.2)
Total Protein: 8.9 g/dL — ABNORMAL HIGH (ref 6.0–8.3)

## 2011-09-25 MED ORDER — ONDANSETRON 8 MG PO TBDP: 8.0000 mg | ORAL_TABLET | Freq: Three times a day (TID) | ORAL | Status: AC | PRN

## 2011-09-25 MED ORDER — ONDANSETRON 4 MG PO TBDP
ORAL_TABLET | ORAL | Status: AC
  Filled 2011-09-25: qty 1

## 2011-09-25 MED ORDER — ONDANSETRON 4 MG PO TBDP
8.0000 mg | ORAL_TABLET | Freq: Once | ORAL | Status: AC
  Administered 2011-09-25: 8 mg via ORAL

## 2011-09-25 MED ORDER — ACETAMINOPHEN 325 MG PO TABS
ORAL_TABLET | ORAL | Status: AC
  Filled 2011-09-25: qty 3

## 2011-09-25 NOTE — Discharge Instructions (Signed)
Norovirus Infection Norovirus illness is caused by a viral infection. The term norovirus refers to a group of viruses. Any of those viruses can cause norovirus illness. This illness is often referred to by other names such as viral gastroenteritis, stomach flu, and food poisoning. Anyone can get a norovirus infection. People can have the illness multiple times during their lifetime. CAUSES  Norovirus is found in the stool or vomit of infected people. It is easily spread from person to person (contagious). People with norovirus are contagious from the moment they begin feeling ill. They may remain contagious for as long as 3 days to 2 weeks after recovery. People can become infected with the virus in several ways. This includes:  Eating food or drinking liquids that are contaminated with norovirus.   Touching surfaces or objects contaminated with norovirus, and then placing your hand in your mouth.   Having direct contact with a person who is infected and shows symptoms. This may occur while caring for someone with illness or while sharing foods or eating utensils with someone who is ill.  SYMPTOMS  Symptoms usually begin 1 to 2 days after ingestion of the virus. Symptoms may include:  Nausea.   Vomiting.   Diarrhea.   Stomach cramps.   Low-grade fever.   Chills.   Headache.   Muscle aches.   Tiredness.  Most people with norovirus illness get better within 1 to 2 days. Some people become dehydrated because they cannot drink enough liquids to replace those lost from vomiting and diarrhea. This is especially true for young children, the elderly, and others who are unable to care for themselves. DIAGNOSIS  Diagnosis is based on your symptoms and exam. Currently, only state public health laboratories have the ability to test for norovirus in stool or vomit. TREATMENT  No specific treatment exists for norovirus infections. No vaccine is available to prevent infections. Norovirus illness  is usually brief in healthy people. If you are ill with vomiting and diarrhea, you should drink enough water and fluids to keep your urine clear or pale yellow. Dehydration is the most serious health effect that can result from this infection. By drinking oral rehydration solution (ORS), people can reduce their chance of becoming dehydrated. There are many commercially available pre-made and powdered ORS designed to safely rehydrate people. These may be recommended by your caregiver. Replace any new fluid losses from diarrhea or vomiting with ORS as follows:  If your child weighs 10 kg or less (22 lb or less), give 60 to 120 ml ( to  cup or 2 to 4 oz) of ORS for each diarrheal stool or vomiting episode.   If your child weighs more than 10 kg (more than 22 lb), give 120 to 240 ml ( to 1 cup or 4 to 8 oz) of ORS for each diarrheal stool or vomiting episode.  HOME CARE INSTRUCTIONS   Follow all your caregiver's instructions.   Avoid sugar-free and alcoholic drinks while ill.   Only take over-the-counter or prescription medicines for pain, vomiting, diarrhea, or fever as directed by your caregiver.  You can decrease your chances of coming in contact with norovirus or spreading it by following these steps:  Frequently wash your hands, especially after using the toilet, changing diapers, and before eating or preparing food.   Carefully wash fruits and vegetables. Cook shellfish before eating them.   Do not prepare food for others while you are infected and for at least 3 days after recovering from illness.  Thoroughly clean and disinfect contaminated surfaces immediately after an episode of illness using a bleach-based household cleaner.   Immediately remove and wash clothing or linens that may be contaminated with the virus.   Use the toilet to dispose of any vomit or stool. Make sure the surrounding area is kept clean.   Food that may have been contaminated by an ill person should be  discarded.  SEEK IMMEDIATE MEDICAL CARE IF:   You develop symptoms of dehydration that do not improve with fluid replacement. This may include:   Excessive sleepiness.   Lack of tears.   Dry mouth.   Dizziness when standing.   Weak pulse.  Document Released: 08/29/2002 Document Revised: 05/28/2011 Document Reviewed: 09/30/2009 [facility] Patient Information 2012 [place].   You have been diagnosed with gastroenteritis.  This can be caused by a virus or a bacteria.  Viral infections can last from less than a day to a week.  If your symptoms last more than a week, a bacterial infection is more likely.  Either way, you must assume you are contagious and take infectious precautions.  If you work in food preparation, you should stay out of work.  Likewise, you should not prepare food for your family.  Practice frequent hand washing.  Hand sanitizer does not reliably kill the virus.  Wash your hands after you use the bathroom, touch your mouth or face, and before contact with anyone.  Do not kiss anyone and do not let anyone eat or drink after you.  For right now, we recommend taking only clear liquids.  This would include things like Gator Aid or other sports drinks, tea, water, ice chips, clear juices, ginger ale, Seven-Up, Sprite, Pedialyte, jello, clear broth--anything you can see through and applesauce.  You should do this for at least 24 hours, perhaps longer.  We recommend small sips at a time.  Sometimes drinking a large amount will cause you to be nauseated and you will vomit it back up.  Sometimes it helps to have this chilled or drink it over ice chips.  Once your stomach settles down a little, you can advance to a very light diet.  We have a diet called the b.r.a.t. Diet which stands for the following:  Bananas  Rice  Apple sauce (not apple juice)  Toast or crackers.  If diarrhea becomes a problem, you may try Imodeum unless your doctor tells you not to. You can take up to  4 per day or 1 every 6 hours.  Stick with this for about 24 hours, then you may advance to a more regular diet, but your stomach will be sensitive for 5 to 7 days, so it would be a good idea to avoid heavy, greasy, fried, or spicey foods.    You should return if:  You symptoms are not better in 3 days or they have gone on for 7 days total.  You have severe symptoms of high fever or severe abdominal pain.  You feel you are getting dehydrated with dizziness, weakness, muscle cramps, or severe fatigue.  You have blood in your vomitus or stool.  This includes black discoloration of your vomitus or stool.  But remember that [place] can cause black stools.

## 2011-09-25 NOTE — ED Provider Notes (Signed)
Chief Complaint  Patient presents with  . GI Problem    History of Present Illness:   [name] [name] is a 36 year old male with a history of Crohn's disease who has had a two-day history of nausea and vomiting and he has slight epigastric abdominal pain, nasal congestion, and a pounding headache. He has not had diarrhea. He's felt somewhat sweaty and chilled. He denies sore throat or cough. He has no urinary symptoms and no history of recent blood in the stool. His Crohn's disease is managed by Dr. Carlean Purl. He is on Remicade for this and is under good control.  Review of Systems:  Other than noted above, the patient denies any of the following symptoms: Systemic:  No fevers, chills, sweats, weight loss or gain, fatigue, or tiredness. ENT:  No nasal congestion, rhinorrhea, or sore throat. Lungs:  No cough, wheezing, or shortness of breath. Cardiac:  No chest pain, syncope, or presyncope. GI:  No abdominal pain, nausea, vomiting, anorexia, diarrhea, constipation, blood in stool or vomitus. GU:  No dysuria, frequency, or urgency.  [place]:  Past medical history, family history, social history, meds, and allergies were reviewed.  Physical Exam:   Vital signs:  BP 143/96  Pulse 97  Temp(Src) 97.9 F (36.6 C) (Oral)  Resp 20  SpO2 96% General:  Alert and oriented.  In no distress.  Skin warm and dry.  Good skin turgor, brisk capillary refill. ENT:  No scleral icterus, moist mucous membranes, no oral lesions, pharynx clear. Lungs:  Breath sounds clear and equal bilaterally.  No wheezes, rales, or rhonchi. Heart:  Rhythm regular, without extrasystoles.  No gallops or murmers. Abdomen:   abdomen was soft, flat, nondistended. There is no dominant pain to palpation, no guarding, or rebound. He has a midline scar just above the umbilicus. Bowel sounds are normally active. No organomegaly or mass.  Skin: Clear, warm, and dry.  Good turgor.  Brisk capillary refill.  Labs:   Results for orders placed during  the hospital encounter of 09/25/11  CBC      Component Value Range   WBC 11.1 (*) 4.0 - 10.5 (K/uL)   RBC 5.30  4.22 - 5.81 (MIL/uL)   Hemoglobin 17.0  13.0 - 17.0 (g/dL)   HCT 49.0  39.0 - 52.0 (%)   MCV 92.5  78.0 - 100.0 (fL)   MCH 32.1  26.0 - 34.0 (pg)   MCHC 34.7  30.0 - 36.0 (g/dL)   RDW 14.3  11.5 - 15.5 (%)   Platelets 225  150 - 400 (K/uL)  DIFFERENTIAL      Component Value Range   Neutrophils Relative 59  43 - 77 (%)   Neutro Abs 6.6  1.7 - 7.7 (K/uL)   Lymphocytes Relative 30  12 - 46 (%)   Lymphs Abs 3.4  0.7 - 4.0 (K/uL)   Monocytes Relative 9  3 - 12 (%)   Monocytes Absolute 1.0  0.1 - 1.0 (K/uL)   Eosinophils Relative 1  0 - 5 (%)   Eosinophils Absolute 0.1  0.0 - 0.7 (K/uL)   Basophils Relative 0  0 - 1 (%)   Basophils Absolute 0.0  0.0 - 0.1 (K/uL)  COMPREHENSIVE METABOLIC PANEL      Component Value Range   Sodium 138  135 - 145 (mEq/L)   Potassium 3.6  3.5 - 5.1 (mEq/L)   Chloride 100  96 - 112 (mEq/L)   CO2 25  19 - 32 (mEq/L)   Glucose, Bld 113 (*)  70 - 99 (mg/dL)   BUN 10  6 - 23 (mg/dL)   Creatinine, Ser 0.64  0.50 - 1.35 (mg/dL)   Calcium 10.0  8.4 - 10.5 (mg/dL)   Total Protein 8.9 (*) 6.0 - 8.3 (g/dL)   Albumin 4.7  3.5 - 5.2 (g/dL)   AST 35  0 - 37 (U/L)   ALT 38  0 - 53 (U/L)   Alkaline Phosphatase 92  39 - 117 (U/L)   Total Bilirubin 0.5  0.3 - 1.2 (mg/dL)   GFR calc non Af Amer >90  >90 (mL/min)   GFR calc Af Amer >90  >90 (mL/min)  LIPASE, BLOOD      Component Value Range   Lipase 31  11 - 59 (U/L)     Course in Urgent Care Center:   He was given Zofran 8 mg by mouth and after that felt a lot better with less nausea. He tolerated this medication well otherwise without any immediate side effects.   Assessment:  The primary encounter diagnosis was Gastroenteritis due to norovirus. A diagnosis of Crohn's disease was also pertinent to this visit.  Plan:   1.  The following meds were prescribed:   New Prescriptions   ONDANSETRON (ZOFRAN  ODT) 8 MG DISINTEGRATING TABLET    Take 1 tablet (8 mg total) by mouth every 8 (eight) hours as needed for nausea.   2.  The patient was instructed in symptomatic care and handouts were given. 3.  The patient was told to return if becoming worse in any way, if no better in 2 or 3 days, and given some red flag symptoms that would indicate earlier return. 4.  The patient was told to take only sips of clear liquids for the next 24 hours and then advance to a b.r.a.t. Diet.      Harden Mo, MD 09/25/11 2112

## 2011-09-25 NOTE — ED Notes (Signed)
Pt states he has ben sick since Wednesday, unable to keep down much in wat of meds or fluids; has been having a pounding HA, worse after vomiting or position changes; NAD at present

## 2011-09-28 ENCOUNTER — Telehealth: Payer: Self-pay

## 2011-09-28 MED ORDER — PROMETHAZINE HCL 25 MG PO TABS: 25.0000 mg | ORAL_TABLET | Freq: Three times a day (TID) | ORAL | Status: DC | PRN

## 2011-09-28 NOTE — Telephone Encounter (Signed)
Will try phenergan 25 mg every 6 hours. Rx sent. Pt called

## 2011-09-28 NOTE — Telephone Encounter (Signed)
Pt called stating that he was Dx with Norovirus this past Friday. Pt is requesting advisement from MD because there has been no improvement in his sxs, please advise.

## 2011-09-28 NOTE — Telephone Encounter (Signed)
Pt states that he was given Zofran 8 mg q8h prn but it is not helping. Pt is requesting another medication for nausea if there is any, please advise.

## 2011-09-28 NOTE — Telephone Encounter (Signed)
Treatment is supportive care: medication for nausea if needed; immodium or Rx lomotil for diarrhea; hydration; tyelnol for fever, aches, bland diet.

## 2011-09-29 ENCOUNTER — Encounter (HOSPITAL_COMMUNITY): Payer: Self-pay

## 2011-09-29 ENCOUNTER — Telehealth: Payer: Self-pay

## 2011-09-29 ENCOUNTER — Emergency Department (HOSPITAL_COMMUNITY)
Admission: EM | Admit: 2011-09-29 | Discharge: 2011-09-29 | Disposition: A | Discharge status: Home / Self Care | Payer: 59 | Attending: Emergency Medicine | Admitting: Emergency Medicine

## 2011-09-29 DIAGNOSIS — R51 Headache: Secondary | ICD-10-CM | POA: Insufficient documentation

## 2011-09-29 DIAGNOSIS — Z79899 Other long term (current) drug therapy: Secondary | ICD-10-CM | POA: Insufficient documentation

## 2011-09-29 DIAGNOSIS — I10 Essential (primary) hypertension: Secondary | ICD-10-CM | POA: Insufficient documentation

## 2011-09-29 DIAGNOSIS — R630 Anorexia: Secondary | ICD-10-CM | POA: Insufficient documentation

## 2011-09-29 DIAGNOSIS — F341 Dysthymic disorder: Secondary | ICD-10-CM | POA: Insufficient documentation

## 2011-09-29 DIAGNOSIS — K5090 Crohn's disease, unspecified, without complications: Secondary | ICD-10-CM | POA: Insufficient documentation

## 2011-09-29 DIAGNOSIS — G4733 Obstructive sleep apnea (adult) (pediatric): Secondary | ICD-10-CM | POA: Insufficient documentation

## 2011-09-29 DIAGNOSIS — R112 Nausea with vomiting, unspecified: Secondary | ICD-10-CM | POA: Insufficient documentation

## 2011-09-29 DIAGNOSIS — E785 Hyperlipidemia, unspecified: Secondary | ICD-10-CM | POA: Insufficient documentation

## 2011-09-29 LAB — BASIC METABOLIC PANEL
BUN: 10 mg/dL (ref 6–23)
Calcium: 9.3 mg/dL (ref 8.4–10.5)
Chloride: 100 mEq/L (ref 96–112)
Creatinine, Ser: 0.83 mg/dL (ref 0.50–1.35)
GFR calc Af Amer: >90 mL/min (ref >90)
GFR calc non Af Amer: >90 mL/min (ref >90)

## 2011-09-29 LAB — DIFFERENTIAL
Basophils Relative: 0 % (ref 0–1)
Eosinophils Absolute: 0 10*3/uL (ref 0.0–0.7)
Eosinophils Relative: 0 % (ref 0–5)
Monocytes Absolute: 1.1 10*3/uL — ABNORMAL HIGH (ref 0.1–1.0)
Monocytes Relative: 10 % (ref 3–12)

## 2011-09-29 LAB — CBC
HCT: 46.8 % (ref 39.0–52.0)
Hemoglobin: 16.1 g/dL (ref 13.0–17.0)
MCH: 31.9 pg (ref 26.0–34.0)
MCHC: 34.4 g/dL (ref 30.0–36.0)
MCV: 92.9 fL (ref 78.0–100.0)
RDW: 14.2 % (ref 11.5–15.5)

## 2011-09-29 MED ORDER — KETOROLAC TROMETHAMINE 30 MG/ML IJ SOLN
30.0000 mg | Freq: Once | INTRAMUSCULAR | Status: AC
  Administered 2011-09-29: 30 mg via INTRAVENOUS
  Filled 2011-09-29: qty 1

## 2011-09-29 MED ORDER — KETOROLAC TROMETHAMINE 10 MG PO TABS: ORAL_TABLET | ORAL | Status: DC

## 2011-09-29 MED ORDER — DIPHENHYDRAMINE HCL 50 MG/ML IJ SOLN
25.0000 mg | Freq: Once | INTRAMUSCULAR | Status: AC
  Administered 2011-09-29: 25 mg via INTRAVENOUS
  Filled 2011-09-29: qty 1

## 2011-09-29 MED ORDER — METOCLOPRAMIDE HCL 5 MG/ML IJ SOLN
10.0000 mg | Freq: Once | INTRAMUSCULAR | Status: AC
  Administered 2011-09-29: 10 mg via INTRAVENOUS
  Filled 2011-09-29: qty 2

## 2011-09-29 MED ORDER — SODIUM CHLORIDE 0.9 % IV BOLUS (SEPSIS)
1000.0000 mL | Freq: Once | INTRAVENOUS | Status: AC
  Administered 2011-09-29: 1000 mL via INTRAVENOUS

## 2011-09-29 NOTE — ED Provider Notes (Signed)
History     CSN: [id number]  Arrival date & time 09/29/11  1255   First MD Initiated Contact with Patient 09/29/11 1728      Chief Complaint  Patient presents with  . Emesis    since friday   . Headache    (Consider location/radiation/quality/duration/timing/severity/associated sxs/prior treatment) HPI Comments: Patient presents with multiple medical problems. Patient developed a headache, nausea and vomiting approximately 6 days ago. He was seen at [facility] urgent care and had a benign workup and was told that he had gastroenteritis. Patient was prescribed Zofran which he has been taking. Patient continues to have headache which is worse at the crown of his head. He does not have a history of migraines or persistent headaches. He denies head injury. He denies fever or neck pain. He denies vision change or trouble walking. The patient has continued to have nausea and vomiting. Last episode of vomiting was this morning. He states he is unable to keep anything down. No abdominal pain. Patient takes methadone for postherpetic neuralgia. Patient has Crohn's disease which is well controlled.  Patient is a 36 y.o. male presenting with headaches. The history is provided by the patient.  Headache  This is a new problem. The current episode started more than 2 days ago. The problem occurs constantly. The problem has not changed since onset.The headache is associated with nothing. Pain location: crown. The pain does not radiate. Associated symptoms include nausea and vomiting. Pertinent negatives include no fever and no shortness of breath.    Past Medical History  Diagnosis Date  . Obstructive sleep apnea (adult) (pediatric)   . Anxiety and depression   . Essential hypertension, benign   . Obesity, unspecified   . Herpes zoster infection 07/2010, 12/2010    with post-herpetic neuralgia  . Vitamin d deficiency   . Crohn's disease   . Hyperlipidemia   . CMV (cytomegalovirus infection) 10/28/2010   . Nephrolithiasis   . Fistula, perirectal     history of  . Sinus infection 08/05/11    Past Surgical History  Procedure Date  . Small intestine surgery     1 ft removed, ileo-cecectomy  . Hypospadias correction     Childhood   . Upper gastrointestinal endoscopy 10/15/2010    w/biopsy, mild gastritis and duodenitis  . Colonoscopy 10/14/2007    crohn's colitis, aphthous ulcers, mild anal stenosis    Family History  Problem Relation Age of Onset  . Heart disease Father     CAD/MI  . Hyperlipidemia Father   . Hypertension Father   . Gout Father   . Sleep apnea Father   . Colon cancer Neg Hx   . Diabetes Neg Hx   . COPD Neg Hx   . Obesity Mother   . Stroke Maternal Grandfather   . Allergies Sister   . Allergies Mother   . Asthma Sister   . Cancer Mother     History  Substance Use Topics  . Smoking status: Never Smoker   . Smokeless tobacco: Never Used  . Alcohol Use: No      Review of Systems  Constitutional: Negative for fever and fatigue.  HENT: Negative for neck pain.   Eyes: Negative for photophobia, pain and visual disturbance.  Respiratory: Negative for shortness of breath.   Cardiovascular: Negative for chest pain.  Gastrointestinal: Positive for nausea and vomiting. Negative for abdominal pain, diarrhea, constipation and blood in stool.  Genitourinary: Negative for decreased urine volume.  Musculoskeletal: Negative for  back pain and gait problem.  Skin: Negative for wound.  Neurological: Positive for headaches. Negative for dizziness, weakness, light-headedness and numbness.  Psychiatric/Behavioral: Negative for confusion and decreased concentration.    Allergies  Review of patient's allergies indicates no known allergies.  Home Medications   Current Outpatient Rx  Name Route Sig Dispense Refill  . ACETAMINOPHEN 325 MG PO TABS Oral Take 650 mg by mouth as needed. PAIN    . INFLIXIMAB 100 MG IV SOLR  weight:      228 LBS      Dx: 555.2 Crohn's  disease  Dosing instructions:   5  mg/kg     every  6   weeks  Pre medication: BENADRYL 25-50 MG BY MOUTH PRIOR TO INFUSION, TYLENOL 625 MG BY MOUTH PRIOR TO INFUSION  Date of Last TB Skin Test: 06/25/10  Results: NEGATIVE   1 each 2  . LISINOPRIL 10 MG PO TABS Oral Take 1 tablet (10 mg total) by mouth daily. 30 tablet 11  . METHADONE HCL 5 MG PO TABS Oral Take 10 mg by mouth 3 (three) times daily. increase to 2 tabs three times a day as directed.    Marland Kitchen ONDANSETRON 8 MG PO TBDP Oral Take 1 tablet (8 mg total) by mouth every 8 (eight) hours as needed for nausea. 20 tablet 0  . PAROXETINE HCL 40 MG PO TABS  TAKE 1 TABLET DAILY 90 tablet 1  . KETOROLAC TROMETHAMINE 10 MG PO TABS  1 po q 8 limit 9 doses, #9 9 tablet 0    BP 169/90  Pulse 109  Temp(Src) 98.6 F (37 C) (Oral)  Resp 24  SpO2 99%  Physical Exam  Nursing note and vitals reviewed. Constitutional: He is oriented to person, place, and time. He appears well-developed and well-nourished.  HENT:  Head: Normocephalic and atraumatic. Head is without raccoon's eyes and without Battle's sign.  Right Ear: Tympanic membrane, external ear and ear canal normal. No hemotympanum.  Left Ear: Tympanic membrane, external ear and ear canal normal. No hemotympanum.  Nose: Nose normal.  Mouth/Throat: Oropharynx is clear and moist.  Eyes: Conjunctivae, EOM and lids are normal. Pupils are equal, round, and reactive to light.  Neck: Normal range of motion. Neck supple.       No meningeal signs  Cardiovascular: Normal rate and regular rhythm.   No murmur heard. Pulmonary/Chest: Effort normal and breath sounds normal. No respiratory distress. He has no rales.  Abdominal: Soft. He exhibits no distension. There is no tenderness. There is no rebound and no guarding.  Musculoskeletal: Normal range of motion.       Cervical back: He exhibits normal range of motion, no tenderness and no bony tenderness.       Thoracic back: He exhibits no tenderness and no  bony tenderness.       Lumbar back: He exhibits no tenderness and no bony tenderness.  Neurological: He is alert and oriented to person, place, and time. He has normal strength and normal reflexes. No cranial nerve deficit or sensory deficit. He exhibits normal muscle tone. Coordination and gait normal. GCS eye subscore is 4. GCS verbal subscore is 5. GCS motor subscore is 6.  Skin: Skin is warm and dry.  Psychiatric: He has a normal mood and affect.    ED Course  Procedures (including critical care time)  Labs Reviewed  CBC - Abnormal; Notable for the following:    WBC 11.1 (*)    All other components  within normal limits  DIFFERENTIAL - Abnormal; Notable for the following:    Lymphs Abs 4.1 (*)    Monocytes Absolute 1.1 (*)    All other components within normal limits  BASIC METABOLIC PANEL - Abnormal; Notable for the following:    Glucose, Bld 103 (*)    All other components within normal limits   No results found.   1. Headache   2. Nausea and vomiting     Patient seen and examined. Work-up initiated. Medications ordered.   Vital signs reviewed and are as follows: Filed Vitals:   09/29/11 1849  BP: 166/91  Pulse: 84  Temp:   Resp:    8:05 PM patient reevaluated. He states that his pain has gone from a 9.5-7.5 after Reglan and Benadryl. He has not vomited. I ordered Toradol. Renal function is normal. Patient is not significantly orthostatic. He has received 1 L of fluids. Will continue to monitor. Discussed with Dr. Tomi Bamberger.  8:48 PM Pain resolved after toradol. PO trial given.   9:13 PM Patient tolerated PO's. He is agreeable to discharge home. Urged to return with persistent vomiting or return of symptoms. Urged PCP follow-up in 3 days.   MDM  HA, no neurological findings. Pain for 6 days. Not worsening, no head injury. Appears well here. Do not suspect intracranial bleed, tumor, or other dangerous etiology at this point however close follow-up is warranted. Patient  appears well, stable at time of discharge.         Carlisle Cater, [place] 09/29/11 2116

## 2011-09-29 NOTE — Telephone Encounter (Signed)
Returned call to patient // rx called to [facility]

## 2011-09-29 NOTE — Telephone Encounter (Signed)
Ketorolac 10 mg 1 po q 8 limit 9 doses, #9

## 2011-09-29 NOTE — Telephone Encounter (Signed)
Patient called c/o "headache that he cannot get to stop". Per pt he was diagnosed with norovirus on Friday 09/25/11 and would like to know if MD can prescribe something to help. Thanks

## 2011-09-29 NOTE — ED Notes (Signed)
Pt in from home with c/o nausea/vomiting since Friday and headache since Thursday denies pain in neck states was seen at [facility] for similar sx with no relief

## 2011-09-29 NOTE — Discharge Instructions (Signed)
Please read and follow all provided instructions.  Your diagnoses today include:  1. Headache   2. Nausea and vomiting     Tests performed today include:  Blood counts which were not concerning for a serious cause of your pain  Vital signs. See below for your results today.   Medications:  In the Emergency Department you received:  Reglan - antinausea/headache medication  Benadryl - antihistamine to counteract potential side effects of reglan  Toradol - anti-inflammatory pain medication    Additional information:  Follow any educational materials contained in this packet.  You are having a headache. No specific cause was found today for your headache. It may have been a migraine or other cause of headache. Stress, anxiety, fatigue, and depression are common triggers for headaches.  Your headache today does not appear to be life-threatening or require hospitalization, but often the exact cause of headaches is not determined in the emergency department. Therefore, follow-up with your doctor is very important to find out what may have caused your headache and whether or not you need any further diagnostic testing or treatment.   Sometimes headaches can appear benign (not harmful), but then more serious symptoms can develop which should prompt an immediate re-evaluation by your doctor or the emergency department.  BE VERY CAREFUL not to take multiple medicines containing Tylenol (also called acetaminophen). Doing so can lead to an overdose which can damage your liver and cause liver failure and possibly death.   Follow-up instructions: Please follow-up with your primary care provider in the next 3 days for further evaluation of your symptoms. If you do not have a primary care doctor -- see below for referral information.   Return instructions:   Please return to the Emergency Department if you experience worsening symptoms.  Return if the medications do not resolve your headache,  if it recurs, or if you have multiple episodes of vomiting or cannot keep down fluids.  Return if you have a change from the usual headache.  RETURN IMMEDIATELY IF you:  Develop a sudden, severe headache  Develop confusion or become poorly responsive or faint  Develop a fever above 100.60F or problem breathing  Have a change in speech, vision, swallowing, or understanding  Develop new weakness, numbness, tingling, incoordination in your arms or legs  Have a seizure  Please return if you have any other emergent concerns.  Additional Information:  Your vital signs today were: BP 166/91  Pulse 84  Temp(Src) 98.6 F (37 C) (Oral)  Resp 24  SpO2 99% If your blood pressure (BP) was elevated above 135/85 this visit, please have this repeated by your doctor within one month. -------------- No Primary Care Doctor Call [facility]  [phone] Other agencies that provide inexpensive medical care    [place]  [facility]  [place]  [phone]    [facility]  [phone]    [facility]  [facility]  [phone] -------------- RESOURCE GUIDE:  Dental Problems  Patients with Medicaid: [facility] [phone] [address]  [place] Phone:  [phone]                                                   Phone:  [phone]  If unable to pay or uninsured, contact:  [facility] or [facility] to become qualified for the adult dental clinic.  Chronic Pain Problems Contact [facility]  [phone] Patients need to be referred by their primary care doctor.  Insufficient Money for Medicine Contact [employer]:  call "211" or [place] [phone].  [place]  [phone] [facility]  [place]   [phone] (emergency services [phone])  Substance Abuse [facility]  [phone] [facility] [phone] [facility] [place] [phone] Chinita Pester [phone] Residential & Outpatient Substance Abuse Program  [phone]  Abuse/Neglect [place] [phone] [place] [phone] (After Hours)  Emergency [place] [phone]  [place] [facility] [place] [phone] [place] [phone]  [facility] Dept. [address]      [place] Phone:  [phone]                                   Phone:  [phone]                 Phone:  [place] Phone:  [place] [phone] (After Hours)

## 2011-09-29 NOTE — ED Provider Notes (Signed)
Medical screening examination/treatment/procedure(s) were performed by non-physician practitioner and as supervising physician I was immediately available for consultation/collaboration.   Kathalene Frames, MD 09/29/11 2119

## 2011-09-30 ENCOUNTER — Telehealth: Payer: Self-pay | Admitting: Neurology

## 2011-09-30 NOTE — Telephone Encounter (Signed)
Pt rescheduled the fu appt that he no showed. Pt is now scheduled to come in on 0423/2013. Pt had norovirus and was unable to keep down any of his medications. He wants instructions on restarting the methadone since he was vomiting. He also needs refills on both the methadone and the percocet since he won't be able to come in to the office until 10/13/2011. Please advise.

## 2011-10-01 ENCOUNTER — Encounter: Payer: Self-pay | Admitting: Internal Medicine

## 2011-10-01 ENCOUNTER — Ambulatory Visit (INDEPENDENT_AMBULATORY_CARE_PROVIDER_SITE_OTHER): Payer: 59 | Admitting: Internal Medicine

## 2011-10-01 ENCOUNTER — Other Ambulatory Visit: Payer: Self-pay | Admitting: Neurology

## 2011-10-01 VITALS — BP 164/98 | HR 93 | Temp 99.3°F | Resp 16 | Wt 216.8 lb

## 2011-10-01 DIAGNOSIS — G4733 Obstructive sleep apnea (adult) (pediatric): Secondary | ICD-10-CM

## 2011-10-01 DIAGNOSIS — I10 Essential (primary) hypertension: Secondary | ICD-10-CM

## 2011-10-01 MED ORDER — METHADONE HCL 5 MG PO TABS: 10.0000 mg | ORAL_TABLET | Freq: Three times a day (TID) | ORAL | Status: DC

## 2011-10-01 MED ORDER — AMLODIPINE-OLMESARTAN 5-40 MG PO TABS: 1.0000 | ORAL_TABLET | Freq: Every day | ORAL | Status: DC

## 2011-10-01 MED ORDER — OXYCODONE-ACETAMINOPHEN 10-325 MG PO TABS: 1.0000 | ORAL_TABLET | Freq: Four times a day (QID) | ORAL | Status: DC | PRN

## 2011-10-01 NOTE — Assessment & Plan Note (Signed)
He is not compliant with his CPAP treatment, I told him that he needs to treat his OSA to help control the BP and headaches

## 2011-10-01 NOTE — Patient Instructions (Signed)

## 2011-10-01 NOTE — Progress Notes (Signed)
Subjective:    Patient ID: [name] [name], male    DOB: 03/03/1976, 36 y.o.   MRN: [id number]  Hypertension This is a chronic problem. The current episode started more than 1 year ago. The problem has been gradually worsening since onset. The problem is uncontrolled. Associated symptoms include headaches. Pertinent negatives include no anxiety, blurred vision, chest pain, malaise/fatigue, neck pain, orthopnea, palpitations, peripheral edema, PND, shortness of breath or sweats. Past treatments include ACE inhibitors. The current treatment provides mild improvement. Compliance problems include exercise, diet and psychosocial issues.  Identifiable causes of hypertension include sleep apnea.      Review of Systems  Constitutional: Negative.  Negative for malaise/fatigue.  HENT: Negative for neck pain.   Eyes: Negative.  Negative for blurred vision.  Respiratory: Positive for apnea. Negative for cough, choking, chest tightness, shortness of breath, wheezing and stridor.   Cardiovascular: Negative for chest pain, palpitations, orthopnea, leg swelling and PND.  Gastrointestinal: Negative.  Negative for nausea, vomiting, abdominal pain, diarrhea, constipation, blood in stool and abdominal distention.  Genitourinary: Negative.   Musculoskeletal: Negative.  Negative for myalgias, back pain, joint swelling, arthralgias and gait problem.  Skin: Negative for color change, pallor, rash and wound.  Neurological: Positive for headaches. Negative for dizziness, tremors, seizures, syncope, facial asymmetry, speech difficulty, weakness, light-headedness and numbness.  Hematological: Negative for adenopathy. Does not bruise/bleed easily.  Psychiatric/Behavioral: Negative.        Objective:   Physical Exam  Vitals reviewed. Constitutional: He is oriented to person, place, and time. He appears well-developed and well-nourished. No distress.  HENT:  Head: Normocephalic and atraumatic.  Mouth/Throat:  Oropharynx is clear and moist. No oropharyngeal exudate.  Eyes: Conjunctivae and EOM are normal. Pupils are equal, round, and reactive to light. Right eye exhibits no discharge. Left eye exhibits no discharge. No scleral icterus.  Neck: Normal range of motion. Neck supple. No JVD present. No tracheal deviation present. No thyromegaly present.  Cardiovascular: Normal rate, regular rhythm, normal heart sounds and intact distal pulses.  Exam reveals no gallop and no friction rub.   No murmur heard. Pulmonary/Chest: Effort normal and breath sounds normal. No stridor. No respiratory distress. He has no wheezes. He has no rales. He exhibits no tenderness.  Abdominal: Soft. Bowel sounds are normal. He exhibits no distension and no mass. There is no tenderness. There is no rebound and no guarding.  Musculoskeletal: Normal range of motion. He exhibits no edema and no tenderness.  Lymphadenopathy:    He has no cervical adenopathy.  Neurological: He is alert and oriented to person, place, and time. He has normal strength. He is not disoriented. He displays no atrophy and normal reflexes. No cranial nerve deficit or sensory deficit. He exhibits normal muscle tone. He displays a negative Romberg sign. He displays no seizure activity. Coordination and gait normal. He displays no Babinski's sign on the right side. He displays no Babinski's sign on the left side.  Reflex Scores:      Tricep reflexes are 1+ on the right side and 1+ on the left side.      Bicep reflexes are 1+ on the left side.      Brachioradialis reflexes are 1+ on the right side and 1+ on the left side.      Patellar reflexes are 1+ on the right side and 1+ on the left side.      Achilles reflexes are 1+ on the right side and 1+ on the left side. Skin: Skin  is warm and dry. No rash noted. He is not diaphoretic. No erythema. No pallor.  Psychiatric: He has a normal mood and affect. His behavior is normal. Judgment and thought content normal.       Lab Results  Component Value Date   WBC 11.1* 09/29/2011   HGB 16.1 09/29/2011   HCT 46.8 09/29/2011   PLT 219 09/29/2011   GLUCOSE 103* 09/29/2011   ALT 38 09/25/2011   AST 35 09/25/2011   NA 138 09/29/2011   K 3.5 09/29/2011   CL 100 09/29/2011   CREATININE 0.83 09/29/2011   BUN 10 09/29/2011   CO2 27 09/29/2011   TSH 2.42 04/10/2011      Assessment & Plan:

## 2011-10-01 NOTE — Assessment & Plan Note (Signed)
His BP is not well controlled so I changed his treatment to azor

## 2011-10-05 ENCOUNTER — Telehealth: Payer: Self-pay

## 2011-10-05 ENCOUNTER — Other Ambulatory Visit: Payer: Self-pay | Admitting: Internal Medicine

## 2011-10-05 MED ORDER — KETOROLAC TROMETHAMINE 10 MG PO TABS: ORAL_TABLET | ORAL | Status: DC

## 2011-10-05 NOTE — Telephone Encounter (Signed)
Pt called stating that he is still having headaches and has used the last Toradol tablet this morning. Pt is requesting Rx refill, please advise in Dr Linda Hedges' absence, thanks!

## 2011-10-06 ENCOUNTER — Telehealth: Payer: Self-pay | Admitting: *Deleted

## 2011-10-06 NOTE — Telephone Encounter (Signed)
Scheduled OV 04.17.13 @ 10:15am w/TLJ

## 2011-10-06 NOTE — Telephone Encounter (Signed)
Patient needs appointment for migraine headache

## 2011-10-07 ENCOUNTER — Encounter: Payer: Self-pay | Admitting: Internal Medicine

## 2011-10-07 ENCOUNTER — Ambulatory Visit (INDEPENDENT_AMBULATORY_CARE_PROVIDER_SITE_OTHER): Payer: 59 | Admitting: Internal Medicine

## 2011-10-07 VITALS — BP 140/90 | HR 95 | Temp 98.3°F | Resp 16

## 2011-10-07 DIAGNOSIS — I10 Essential (primary) hypertension: Secondary | ICD-10-CM

## 2011-10-07 DIAGNOSIS — R51 Headache: Secondary | ICD-10-CM | POA: Insufficient documentation

## 2011-10-07 DIAGNOSIS — R519 Headache, unspecified: Secondary | ICD-10-CM | POA: Insufficient documentation

## 2011-10-07 MED ORDER — ELETRIPTAN HYDROBROMIDE 40 MG PO TABS: 40.0000 mg | ORAL_TABLET | ORAL | Status: DC | PRN

## 2011-10-07 NOTE — Assessment & Plan Note (Addendum)
I think he is having a protracted migraine HA so I have asked him to try relpax and I have ordered a CT of his brain to look for CNS mass, bleed, CVA

## 2011-10-07 NOTE — Progress Notes (Signed)
Subjective:    Patient ID: [name] [name], male    DOB: [birth year], 36 y.o.   MRN: [id number]  Headache  This is a recurrent problem. The current episode started 1 to 4 weeks ago. The problem occurs constantly. The problem has been unchanged. The pain is located in the bilateral region. The pain does not radiate. The pain quality is similar to prior headaches. The quality of the pain is described as throbbing. The pain is at a severity of 6/10. The pain is mild. Associated symptoms include insomnia, nausea, phonophobia, photophobia and vomiting. Pertinent negatives include no abdominal pain, abnormal behavior, anorexia, back pain, blurred vision, coughing, dizziness, drainage, ear pain, eye pain, eye redness, facial sweating, fever, hearing loss, loss of balance, muscle aches, neck pain, numbness, rhinorrhea, scalp tenderness, seizures, sinus pressure, sore throat, swollen glands, tingling, tinnitus, visual change, weakness or weight loss. The symptoms are aggravated by emotional stress. He has tried oral narcotics and NSAIDs for the symptoms. The treatment provided moderate relief. His past medical history is significant for hypertension and obesity.      Review of Systems  Constitutional: Negative for fever, chills, weight loss, diaphoresis, activity change, appetite change, fatigue and unexpected weight change.  HENT: Negative for hearing loss, ear pain, sore throat, rhinorrhea, neck pain, sinus pressure and tinnitus.   Eyes: Positive for photophobia. Negative for blurred vision, pain, discharge, redness, itching and visual disturbance.  Respiratory: Negative for cough, chest tightness, shortness of breath, wheezing and stridor.   Cardiovascular: Negative for chest pain, palpitations and leg swelling.  Gastrointestinal: Positive for nausea and vomiting. Negative for abdominal pain, diarrhea, constipation, blood in stool, abdominal distention, anal bleeding, rectal pain and anorexia.    Genitourinary: Negative.   Musculoskeletal: Negative for myalgias, back pain, joint swelling, arthralgias and gait problem.  Skin: Negative.   Neurological: Positive for headaches. Negative for dizziness, tingling, tremors, seizures, syncope, facial asymmetry, speech difficulty, weakness, light-headedness, numbness and loss of balance.  Hematological: Negative for adenopathy. Does not bruise/bleed easily.  Psychiatric/Behavioral: The patient has insomnia.        Objective:   Physical Exam  Vitals reviewed. Constitutional: He is oriented to person, place, and time. He appears well-developed and well-nourished.  Non-toxic appearance. He does not have a sickly appearance. He does not appear ill. No distress.  HENT:  Head: Normocephalic and atraumatic.  Mouth/Throat: Oropharynx is clear and moist. No oropharyngeal exudate.  Eyes: Conjunctivae and EOM are normal. Pupils are equal, round, and reactive to light. Right eye exhibits no discharge. Left eye exhibits no discharge. No scleral icterus.  Neck: Normal range of motion. Neck supple. No JVD present. No tracheal deviation present. No thyromegaly present.  Cardiovascular: Normal rate, regular rhythm, normal heart sounds and intact distal pulses.  Exam reveals no gallop and no friction rub.   No murmur heard. Pulmonary/Chest: Effort normal and breath sounds normal. No stridor. No respiratory distress. He has no wheezes. He has no rales. He exhibits no tenderness.  Abdominal: Soft. Bowel sounds are normal. He exhibits no distension. There is no tenderness. There is no rebound and no guarding.  Musculoskeletal: Normal range of motion. He exhibits no edema and no tenderness.  Lymphadenopathy:    He has no cervical adenopathy.  Neurological: He is alert and oriented to person, place, and time. He has normal strength. He displays no atrophy, no tremor and normal reflexes. No cranial nerve deficit or sensory deficit. He exhibits normal muscle tone.  He displays a negative Romberg  sign. He displays no seizure activity. Coordination and gait normal. He displays no Babinski's sign on the right side. He displays no Babinski's sign on the left side.  Reflex Scores:      Tricep reflexes are 1+ on the right side and 1+ on the left side.      Bicep reflexes are 1+ on the right side and 1+ on the left side.      Brachioradialis reflexes are 1+ on the right side and 1+ on the left side.      Patellar reflexes are 1+ on the right side and 1+ on the left side.      Achilles reflexes are 1+ on the right side and 1+ on the left side. Skin: Skin is warm and dry. No rash noted. He is not diaphoretic. No erythema. No pallor.  Psychiatric: He has a normal mood and affect. His behavior is normal. Judgment and thought content normal.      Lab Results  Component Value Date   WBC 11.1* 09/29/2011   HGB 16.1 09/29/2011   HCT 46.8 09/29/2011   PLT 219 09/29/2011   GLUCOSE 103* 09/29/2011   ALT 38 09/25/2011   AST 35 09/25/2011   NA 138 09/29/2011   K 3.5 09/29/2011   CL 100 09/29/2011   CREATININE 0.83 09/29/2011   BUN 10 09/29/2011   CO2 27 09/29/2011   TSH 2.42 04/10/2011      Assessment & Plan:

## 2011-10-07 NOTE — Assessment & Plan Note (Signed)
His BP has improved on azor, I have asked him to continue the same for now

## 2011-10-07 NOTE — Patient Instructions (Signed)

## 2011-10-08 ENCOUNTER — Telehealth: Payer: Self-pay | Admitting: *Deleted

## 2011-10-08 ENCOUNTER — Ambulatory Visit: Payer: 59 | Admitting: Endocrinology

## 2011-10-08 ENCOUNTER — Telehealth: Payer: Self-pay | Admitting: Internal Medicine

## 2011-10-08 DIAGNOSIS — R51 Headache: Secondary | ICD-10-CM

## 2011-10-08 NOTE — Telephone Encounter (Signed)
Spoke with pt and he states he has been having problems with headaches. States that the medication he was given yesterday by his PCP is not helping. Let pt know he should call his PCP back and speak with them about the medication, they may want to give him something different or it may take time for the medication to work. Pt verbalized understanding.

## 2011-10-08 NOTE — Telephone Encounter (Signed)
Pt reports seen 04.17.13 for Migraine and Rx given is subtherapeutic [states only Toradol has worked in past]-waiting to hear back on CT scan and would like to know if any advice to further aleve H/A. Please advise.

## 2011-10-08 NOTE — Telephone Encounter (Signed)
I would defer to Dr Ronnald Ramp, who saw pt apr 18  CT results still pending on emr at this time

## 2011-10-09 ENCOUNTER — Other Ambulatory Visit (INDEPENDENT_AMBULATORY_CARE_PROVIDER_SITE_OTHER): Payer: 59

## 2011-10-09 ENCOUNTER — Encounter: Payer: Self-pay | Admitting: Endocrinology

## 2011-10-09 ENCOUNTER — Telehealth: Payer: Self-pay

## 2011-10-09 ENCOUNTER — Telehealth: Payer: Self-pay | Admitting: Internal Medicine

## 2011-10-09 ENCOUNTER — Ambulatory Visit (INDEPENDENT_AMBULATORY_CARE_PROVIDER_SITE_OTHER): Payer: 59 | Admitting: Endocrinology

## 2011-10-09 VITALS — BP 132/98 | HR 101 | Temp 98.2°F | Ht 69.0 in | Wt 214.0 lb

## 2011-10-09 DIAGNOSIS — R51 Headache: Secondary | ICD-10-CM

## 2011-10-09 LAB — SEDIMENTATION RATE: Sed Rate: 15 mm/hr (ref 0–22)

## 2011-10-09 MED ORDER — ONDANSETRON HCL 4 MG PO TABS: 4.0000 mg | ORAL_TABLET | Freq: Three times a day (TID) | ORAL | Status: DC | PRN

## 2011-10-09 MED ORDER — OXYCODONE HCL 30 MG PO TABS: 30.0000 mg | ORAL_TABLET | ORAL | Status: DC | PRN

## 2011-10-09 MED ORDER — ONDANSETRON HCL 4 MG PO TABS: 4.0000 mg | ORAL_TABLET | Freq: Three times a day (TID) | ORAL | Status: AC | PRN

## 2011-10-09 NOTE — Telephone Encounter (Signed)
He can't keep taking toradol, he should change to motrin

## 2011-10-09 NOTE — Telephone Encounter (Signed)
A user error has taken place: encounter opened in error, closed for administrative reasons.

## 2011-10-09 NOTE — Progress Notes (Signed)
Subjective:    Patient ID: [name] [name], male    DOB: 1975/08/06, 36 y.o.   MRN: [id number]  HPI Pt states few weeks of intermittent moderate headache, worst at the crown of the head, and assoc diaphoresis.  No help with percocet.   Past Medical History  Diagnosis Date  . Obstructive sleep apnea (adult) (pediatric)   . Anxiety and depression   . Essential hypertension, benign   . Obesity, unspecified   . Herpes zoster infection 07/2010, 12/2010    with post-herpetic neuralgia  . Vitamin d deficiency   . Crohn's disease   . Hyperlipidemia   . CMV (cytomegalovirus infection) 10/28/2010  . Nephrolithiasis   . Fistula, perirectal     history of  . Sinus infection 08/05/11    Past Surgical History  Procedure Date  . Small intestine surgery     1 ft removed, ileo-cecectomy  . Hypospadias correction     Childhood   . Upper gastrointestinal endoscopy 10/15/2010    w/biopsy, mild gastritis and duodenitis  . Colonoscopy 10/14/2007    crohn's colitis, aphthous ulcers, mild anal stenosis    History   Social History  . Marital Status: Legally Separated    Spouse Name: N/A    Number of Children: 2  . 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 Smoking status: Never Smoker   . Smokeless tobacco: Never Used  . Alcohol Use: No  . Drug Use: No  . Sexually Active: Not Currently -- Male partner(s)   Other Topics Concern  . Not on file   Social History Narrative   HSG, [employer] - 2 years. Married '99 - 41yr/divorced. 2 sons - '98, '00 split custody. 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 No history of physical or sexual abuse.     Current Outpatient Prescriptions on File Prior to Visit  Medication Sig Dispense Refill  . acetaminophen (TYLENOL) 325 MG tablet Take 650 mg by mouth as needed. PAIN      . amLODipine-olmesartan (AZOR) 5-40 MG per tablet Take 1 tablet by mouth daily.  70  tablet  0  . inFLIXimab (REMICADE) 100 MG injection weight:      228 LBS      Dx: 555.2 Crohn's disease  Dosing instructions:   5  mg/kg     every  6   weeks  Pre medication: BENADRYL 25-50 MG BY MOUTH PRIOR TO INFUSION, TYLENOL 625 MG BY MOUTH PRIOR TO INFUSION  Date of Last TB Skin Test: 06/25/10  Results: NEGATIVE    1 each  2  . ketorolac (TORADOL) 10 MG tablet 1 po q 8 limit 9 doses, #9  9 tablet  0  . methadone (DOLOPHINE) 5 MG tablet Take 2 tablets (10 mg total) by mouth 3 (three) times daily. increase to 2 tabs three times a day as directed.  180 tablet  0  . oxyCODONE-acetaminophen (PERCOCET) 10-325 MG per tablet Take 1 tablet by mouth every 6 (six) hours as needed for pain.  60 tablet  0  . PARoxetine (PAXIL) 40 MG tablet TAKE 1 TABLET DAILY  90 tablet  1  . eletriptan (RELPAX) 40 MG tablet One tablet by mouth at onset of headache. May repeat in 2 hours if headache persists or recurs. may repeat in 2 hours if necessary  6 tablet  0    No Known  Allergies  Family History  Problem Relation Age of Onset  . Heart disease Father     CAD/MI  . Hyperlipidemia Father   . Hypertension Father   . Gout Father   . Sleep apnea Father   . Colon cancer Neg Hx   . Diabetes Neg Hx   . COPD Neg Hx   . Obesity Mother   . Stroke Maternal Grandfather   . Allergies Sister   . Allergies Mother   . Asthma Sister   . Cancer Mother     BP 132/98  Pulse 101  Temp(Src) 98.2 F (36.8 C) (Oral)  Ht 5' 9"  (1.753 m)  Wt 214 lb (97.07 kg)  BMI 31.60 kg/m2  SpO2 97%   Review of Systems He also has n/v, but no LOC    Objective:   Physical Exam VS: see vs page GEN: no distress HEAD: head: no deformity eyes: no periorbital swelling, no proptosis external nose and ears are normal mouth: no lesion seen NECK: supple, thyroid is not enlarged MUSCULOSKELETAL: gait is normal and steady NEURO:  cn 2-12 grossly intact.   readily moves all 4's.  sensation is intact to touch on the feet SKIN:  Not  diaphoretic now NODES:  None palpable at the neck PSYCH: alert, oriented x3.  Does not appear anxious nor depressed.    Lab Results  Component Value Date   ESRSEDRATE 15 10/09/2011      Assessment & Plan:  Headache, persistent

## 2011-10-09 NOTE — Telephone Encounter (Signed)
Pt aware of referral. Pt calling again stating he is out of Toradol. He states Relpax hasnt touched his pain. Can he have something to hold him until neuro appt?

## 2011-10-09 NOTE — Patient Instructions (Addendum)
blood tests are being requested for you today.  You will receive a letter with results. Please do the CT scan as scheduled. i have sent a prescription to your pharmacy, for anti-nausea.   Here is a prescription for a pain medication. I hope you feel better soon.  If you don't feel better by next week, please call back.

## 2011-10-09 NOTE — Telephone Encounter (Signed)
Pt informed

## 2011-10-09 NOTE — Telephone Encounter (Signed)
Called and spoke with the patient. He states that since having the Nor[place] virus last week he has had a migraine HA every day and he states he has never had migraines before. Pain is a 10 out of a 10 on the pain scale; nausea and vomiting along with sweating and he "can hear the blood rushing in his ears". Also says the pain is worse when he goes to the bathroom; not associated with straining as per the patient. He is having a head CT but that isn't until Monday. Tried Relpax with no relief. Toradol has helped but he knows he just can't keep taking that. Has an appointment today at 3:15 downstairs with the PCP. Patient says he is going to stop by our office after that appointment and let us know what is going on. Lastly, he also reports that PCP has referred him to "another neurologist" on [address] but he didn't know who it was. The patient states he is confused because Dr. [name] is his neurologist and PCP knows that. I asked that he let us know the outcome of his MD visit and that I would let Dr. [name] know what was going on as well. **Dr. [name], [place].

## 2011-10-09 NOTE — Telephone Encounter (Signed)
I think he should see a headache specialist

## 2011-10-11 ENCOUNTER — Encounter: Payer: Self-pay | Admitting: Endocrinology

## 2011-10-12 ENCOUNTER — Ambulatory Visit (INDEPENDENT_AMBULATORY_CARE_PROVIDER_SITE_OTHER)
Admission: RE | Admit: 2011-10-12 | Discharge: 2011-10-12 | Disposition: A | Discharge status: Home / Self Care | Payer: 59 | Source: Ambulatory Visit | Attending: Internal Medicine | Admitting: Internal Medicine

## 2011-10-12 ENCOUNTER — Ambulatory Visit: Payer: 59 | Admitting: Internal Medicine

## 2011-10-12 ENCOUNTER — Telehealth: Payer: Self-pay | Admitting: *Deleted

## 2011-10-12 DIAGNOSIS — R51 Headache: Secondary | ICD-10-CM

## 2011-10-12 NOTE — Telephone Encounter (Signed)
Called pt to inform of lab results. Pt informed of lab results on VM and to callback office with any questions/concerns (letter also mailed out to pt).

## 2011-10-13 ENCOUNTER — Inpatient Hospital Stay (HOSPITAL_COMMUNITY)
Admission: AD | Admit: 2011-10-13 | Discharge: 2011-10-15 | DRG: 103 | Disposition: A | Discharge status: Home / Self Care | Payer: 59 | Source: Ambulatory Visit | Attending: Neurology | Admitting: Neurology

## 2011-10-13 ENCOUNTER — Inpatient Hospital Stay (HOSPITAL_COMMUNITY): Payer: 59

## 2011-10-13 ENCOUNTER — Encounter: Payer: Self-pay | Admitting: Neurology

## 2011-10-13 ENCOUNTER — Other Ambulatory Visit: Payer: Self-pay

## 2011-10-13 ENCOUNTER — Ambulatory Visit (INDEPENDENT_AMBULATORY_CARE_PROVIDER_SITE_OTHER): Payer: 59 | Admitting: Neurology

## 2011-10-13 VITALS — BP 120/74 | HR 84 | Wt 221.0 lb

## 2011-10-13 DIAGNOSIS — R51 Headache: Principal | ICD-10-CM | POA: Diagnosis present

## 2011-10-13 DIAGNOSIS — B0229 Other postherpetic nervous system involvement: Secondary | ICD-10-CM | POA: Diagnosis present

## 2011-10-13 DIAGNOSIS — Z823 Family history of stroke: Secondary | ICD-10-CM

## 2011-10-13 DIAGNOSIS — Z8249 Family history of ischemic heart disease and other diseases of the circulatory system: Secondary | ICD-10-CM

## 2011-10-13 DIAGNOSIS — H532 Diplopia: Secondary | ICD-10-CM | POA: Diagnosis present

## 2011-10-13 DIAGNOSIS — E669 Obesity, unspecified: Secondary | ICD-10-CM | POA: Diagnosis present

## 2011-10-13 DIAGNOSIS — K5090 Crohn's disease, unspecified, without complications: Secondary | ICD-10-CM

## 2011-10-13 DIAGNOSIS — E785 Hyperlipidemia, unspecified: Secondary | ICD-10-CM | POA: Diagnosis present

## 2011-10-13 DIAGNOSIS — Z79899 Other long term (current) drug therapy: Secondary | ICD-10-CM

## 2011-10-13 DIAGNOSIS — G4733 Obstructive sleep apnea (adult) (pediatric): Secondary | ICD-10-CM | POA: Diagnosis present

## 2011-10-13 DIAGNOSIS — K5010 Crohn's disease of large intestine without complications: Secondary | ICD-10-CM | POA: Diagnosis present

## 2011-10-13 DIAGNOSIS — F341 Dysthymic disorder: Secondary | ICD-10-CM | POA: Diagnosis present

## 2011-10-13 DIAGNOSIS — F40298 Other specified phobia: Secondary | ICD-10-CM | POA: Diagnosis present

## 2011-10-13 DIAGNOSIS — Z825 Family history of asthma and other chronic lower respiratory diseases: Secondary | ICD-10-CM

## 2011-10-13 DIAGNOSIS — E559 Vitamin D deficiency, unspecified: Secondary | ICD-10-CM | POA: Diagnosis present

## 2011-10-13 LAB — DIFFERENTIAL
Basophils Absolute: 0 10*3/uL (ref 0.0–0.1)
Basophils Relative: 0 % (ref 0–1)
Eosinophils Absolute: 0.3 10*3/uL (ref 0.0–0.7)
Lymphs Abs: 3.5 10*3/uL (ref 0.7–4.0)
Neutrophils Relative %: 36 % — ABNORMAL LOW (ref 43–77)

## 2011-10-13 LAB — CBC
MCH: 31.1 pg (ref 26.0–34.0)
Platelets: 267 10*3/uL (ref 150–400)
RBC: 4.41 MIL/uL (ref 4.22–5.81)
RDW: 14.1 % (ref 11.5–15.5)

## 2011-10-13 LAB — COMPREHENSIVE METABOLIC PANEL
AST: 41 U/L — ABNORMAL HIGH (ref 0–37)
Alkaline Phosphatase: 79 U/L (ref 39–117)
BUN: 11 mg/dL (ref 6–23)
CO2: 32 mEq/L (ref 19–32)
Chloride: 101 mEq/L (ref 96–112)
Creatinine, Ser: 0.77 mg/dL (ref 0.50–1.35)
GFR calc non Af Amer: >90 mL/min (ref >90)
Total Bilirubin: 0.2 mg/dL — ABNORMAL LOW (ref 0.3–1.2)

## 2011-10-13 MED ORDER — ACETAMINOPHEN 325 MG PO TABS
650.0000 mg | ORAL_TABLET | ORAL | Status: DC
  Administered 2011-10-14: 650 mg via ORAL
  Filled 2011-10-13: qty 2

## 2011-10-13 MED ORDER — ONDANSETRON HCL 4 MG PO TABS: 4.0000 mg | ORAL_TABLET | Freq: Four times a day (QID) | ORAL | Status: DC | PRN

## 2011-10-13 MED ORDER — ACETAMINOPHEN 325 MG PO TABS: 650.0000 mg | ORAL_TABLET | ORAL | Status: DC

## 2011-10-13 MED ORDER — SODIUM CHLORIDE 0.9 % IV SOLN: INTRAVENOUS | Status: DC

## 2011-10-13 MED ORDER — TUBERCULIN PPD 5 UNIT/0.1ML ID SOLN
5.0000 [IU] | Freq: Once | INTRADERMAL | Status: AC
  Administered 2011-10-13: 5 [IU] via INTRADERMAL
  Filled 2011-10-13: qty 0.1

## 2011-10-13 MED ORDER — DIPHENHYDRAMINE HCL 50 MG PO CAPS
50.0000 mg | ORAL_CAPSULE | ORAL | Status: DC
  Administered 2011-10-14: 50 mg via ORAL
  Filled 2011-10-13: qty 1

## 2011-10-13 MED ORDER — DIPHENHYDRAMINE HCL 50 MG PO CAPS
50.0000 mg | ORAL_CAPSULE | ORAL | Status: DC
  Filled 2011-10-13: qty 1

## 2011-10-13 MED ORDER — HYDROCODONE-ACETAMINOPHEN 5-325 MG PO TABS
1.0000 | ORAL_TABLET | ORAL | Status: DC | PRN
  Administered 2011-10-13 – 2011-10-15 (×7): 2 via ORAL
  Filled 2011-10-13 (×7): qty 2

## 2011-10-13 MED ORDER — INFLIXIMAB 100 MG IV SOLR
5.0000 mg/kg | INTRAVENOUS | Status: DC
  Filled 2011-10-13: qty 50

## 2011-10-13 MED ORDER — SODIUM CHLORIDE 0.9 % IJ SOLN: 3.0000 mL | INTRAMUSCULAR | Status: DC | PRN

## 2011-10-13 MED ORDER — ACETAMINOPHEN 325 MG PO TABS: 650.0000 mg | ORAL_TABLET | Freq: Four times a day (QID) | ORAL | Status: DC | PRN

## 2011-10-13 MED ORDER — SODIUM CHLORIDE 0.9 % IJ SOLN
3.0000 mL | Freq: Two times a day (BID) | INTRAMUSCULAR | Status: DC
  Administered 2011-10-15: 3 mL via INTRAVENOUS

## 2011-10-13 MED ORDER — SODIUM CHLORIDE 0.9 % IJ SOLN
3.0000 mL | Freq: Two times a day (BID) | INTRAMUSCULAR | Status: DC
  Administered 2011-10-14: 3 mL via INTRAVENOUS

## 2011-10-13 MED ORDER — ONDANSETRON HCL 4 MG/2ML IJ SOLN: 4.0000 mg | Freq: Four times a day (QID) | INTRAMUSCULAR | Status: DC | PRN

## 2011-10-13 MED ORDER — SODIUM CHLORIDE 0.9 % IV SOLN
500.0000 mg | INTRAVENOUS | Status: DC
  Administered 2011-10-14: 500 mg via INTRAVENOUS
  Filled 2011-10-13: qty 50

## 2011-10-13 MED ORDER — ACETAMINOPHEN 650 MG RE SUPP: 650.0000 mg | Freq: Four times a day (QID) | RECTAL | Status: DC | PRN

## 2011-10-13 MED ORDER — SODIUM CHLORIDE 0.9 % IV SOLN: 250.0000 mL | INTRAVENOUS | Status: DC | PRN

## 2011-10-13 NOTE — H&P (Signed)
Admission H&P    Chief Complaint: Headache and diplopia.  HPI: [name] [name] is an 36 y.o. male with a history of Crohn's disease treated with Remicade and recent herpes zoster infection, presenting with headache of moderate to severe intensity as well as diplopia on right and left lateral gaze for about 10 days. Headache is worse with coughing or straining. There is associated nausea with the headaches are severe. Pain involves the top of his head primarily. Neck pain is only been slight. He has had no documented fever. Patient was admitted for evaluation to rule out possible meningitis, including imaging studies of his brain and lumbar puncture under fluoroscopy. CT scan of his head today showed no acute intracranial abnormality.  Past Medical History  Diagnosis Date  . Obstructive sleep apnea (adult) (pediatric)   . Anxiety and depression   . Essential hypertension, benign   . Obesity, unspecified   . Herpes zoster infection 07/2010, 12/2010    with post-herpetic neuralgia  . Vitamin d deficiency   . Crohn's disease   . Hyperlipidemia   . CMV (cytomegalovirus infection) 10/28/2010  . Nephrolithiasis   . Fistula, perirectal     history of  . Sinus infection 08/05/11    Past Surgical History  Procedure Date  . Small intestine surgery     1 ft removed, ileo-cecectomy  . Hypospadias correction     Childhood   . Upper gastrointestinal endoscopy 10/15/2010    w/biopsy, mild gastritis and duodenitis  . Colonoscopy 10/14/2007    crohn's colitis, aphthous ulcers, mild anal stenosis    Family History  Problem Relation Age of Onset  . Heart disease Father     CAD/MI  . Hyperlipidemia Father   . Hypertension Father   . Gout Father   . Sleep apnea Father   . Colon cancer Neg Hx   . Diabetes Neg Hx   . COPD Neg Hx   . Obesity Mother   . Stroke Maternal Grandfather   . Allergies Sister   . Allergies Mother   . Asthma Sister   . Cancer Mother    Social History:  reports that he  has never smoked. He has never used smokeless tobacco. He reports that he does not drink alcohol or use illicit drugs.  Allergies: No Known Allergies  Medications Prior to Admission  Medication Sig Dispense Refill  . acetaminophen (TYLENOL) 325 MG tablet Take 650 mg by mouth as needed. PAIN      . amLODipine-olmesartan (AZOR) 5-40 MG per tablet Take 1 tablet by mouth daily.  70 tablet  0  . eletriptan (RELPAX) 40 MG tablet One tablet by mouth at onset of headache. May repeat in 2 hours if headache persists or recurs. may repeat in 2 hours if necessary  6 tablet  0  . ketorolac (TORADOL) 10 MG tablet 1 po q 8 limit 9 doses, #9  9 tablet  0  . methadone (DOLOPHINE) 5 MG tablet Take 2 tablets (10 mg total) by mouth 3 (three) times daily. increase to 2 tabs three times a day as directed.  180 tablet  0  . ondansetron (ZOFRAN) 4 MG tablet Take 1 tablet (4 mg total) by mouth every 8 (eight) hours as needed for nausea.  30 tablet  0  . oxycodone (ROXICODONE) 30 MG immediate release tablet Take 1 tablet (30 mg total) by mouth every 4 (four) hours as needed for pain.  20 tablet  0  . oxyCODONE-acetaminophen (PERCOCET) 10-325 MG per  tablet Take 1 tablet by mouth every 6 (six) hours as needed for pain.  60 tablet  0  . PARoxetine (PAXIL) 40 MG tablet TAKE 1 TABLET DAILY  90 tablet  1    ROS: Negative except for presenting symptoms as described above, and recent infection herpes zoster.  Physical Examination: There were no vitals taken for this visit.  HEENT-  Normocephalic, no lesions, without obvious abnormality.  Normal external eye and conjunctiva.  Normal TM's bilaterally.  Normal auditory canals and external ears. Normal external nose, mucus membranes and septum.  Normal pharynx. Neck supple with no masses, nodes, nodules or enlargement. Cardiovascular - regular rate and rhythm, S1, S2 normal, no murmur, click, rub or gallop Lungs - chest clear, no wheezing, rales, normal symmetric air entry,  Heart exam - S1, S2 normal, no murmur, no gallop, rate regular Abdomen - soft, non-tender; bowel sounds normal; no masses,  no organomegaly Extremities - no joint deformities, effusion, or inflammation, no edema and no skin discoloration  Neurologic Examination: Mental Status: Alert, oriented, thought content appropriate.  Speech fluent without evidence of aphasia. Able to follow commands without difficulty. Cranial Nerves: II-Visual fields were normal. III/IV/VI-Pupils were equal and reacted. Extraocular movements were full and conjugate, although patient had subjective diplopia on right and left lateral gaze.    V/VII-no facial numbness and no facial weakness. VIII-normal. X-normal speech and symmetrical palatal movement. XII-midline tongue extension Motor: 5/5 bilaterally with normal tone and bulk Sensory: Normal throughout. Deep Tendon Reflexes: 2+ and brisk and symmetric. Plantars: Flexor bilaterally Cerebellar: Normal finger-to-nose testing. Carotid auscultation: Normal   No results found for this or any previous visit (from the past 48 hour(s)). Ct Head Wo Contrast  10/12/2011  *RADIOLOGY REPORT*  Clinical Data: Headache, dizziness, nausea  CT HEAD WITHOUT CONTRAST  Technique:  Contiguous axial images were obtained from the base of the skull through the vertex without contrast.  Comparison: 07/02/2007  Findings: There is no evidence of acute intracranial hemorrhage, brain edema, mass lesion, acute infarction,   mass effect, or midline shift. Acute infarct may be inapparent on noncontrast CT. No other intra-axial abnormalities are seen, and the ventricles and sulci are within normal limits in size and symmetry.   No abnormal extra-axial fluid collections or masses are identified.  No significant calvarial abnormality.  IMPRESSION: 1. Negative for bleed or other acute intracranial process.  Original Report Authenticated By: Trecia Rogers, M.D.    Assessment/Plan  Recurrent  severe headache with diplopia and nausea in patient who is somewhat immune compromised on Remicade for Crohn's disease. Meningitis will need to be ruled out.  Plan: 1. Lumbar puncture under fluoroscopy 2. MRI of the brain without and with contrast 3. Management of headache and nausea as needed with analgesic and antibiotic medications, respectively.  C.R. Nicole Kindred, MD [facility]  10/13/2011, 9:04 PM

## 2011-10-13 NOTE — Progress Notes (Signed)
Dear Dr. Linda Hedges,  I saw  [name] [name] back in [facility] for his problem with post-herpetic neuralgia requiring chronic opiod treatment but who is now complaining of persistent headache after a "norovirus" infection.  As you may recall, he is a 36 y.o. year old male with a history of Crohn's disease, chronically immunosuppressed with infliximab who has had a two week history of headache that localizes to the top of his head, that is unrelenting.  Interestingly, it started with nausea and vomiting that was diagnosed as norovirus although it is unclear whether this was diagnosed by labs or clinically.  He was sent home with Zofran from the ED.  His headache has persisted.  It is invariant to position, but worse when he has a bowel movement(although he says he is not straining).  He did say that he hears pulsatile tinnitus and has developed new double vision.  He is constantly breaking out into a sweat.  He has a mild stiff neck, but denies significant photo or phonophobia.    The headache does not seem to be improving.  He has no history of headaches.  He has not had a lumbar puncture.    Notably the patient has a history of a CMV infection.  Medical history, social history, and family history were reviewed and have not changed since the last clinic visit.  Current Outpatient Prescriptions on File Prior to Visit  Medication Sig Dispense Refill  . acetaminophen (TYLENOL) 325 MG tablet Take 650 mg by mouth as needed. PAIN      . amLODipine-olmesartan (AZOR) 5-40 MG per tablet Take 1 tablet by mouth daily.  70 tablet  0  . eletriptan (RELPAX) 40 MG tablet One tablet by mouth at onset of headache. May repeat in 2 hours if headache persists or recurs. may repeat in 2 hours if necessary  6 tablet  0  . methadone (DOLOPHINE) 5 MG tablet Take 2 tablets (10 mg total) by mouth 3 (three) times daily. increase to 2 tabs three times a day as directed.  180 tablet  0  . ondansetron (ZOFRAN) 4 MG  tablet Take 1 tablet (4 mg total) by mouth every 8 (eight) hours as needed for nausea.  30 tablet  0  . oxycodone (ROXICODONE) 30 MG immediate release tablet Take 1 tablet (30 mg total) by mouth every 4 (four) hours as needed for pain.  20 tablet  0  . oxyCODONE-acetaminophen (PERCOCET) 10-325 MG per tablet Take 1 tablet by mouth every 6 (six) hours as needed for pain.  60 tablet  0  . PARoxetine (PAXIL) 40 MG tablet TAKE 1 TABLET DAILY  90 tablet  1  . ketorolac (TORADOL) 10 MG tablet 1 po q 8 limit 9 doses, #9  9 tablet  0    No Known Allergies  ROS:  13 systems were reviewed and are notable for constant sweating.  He denies any swollen lymphnodes..  All other review of systems are unremarkable.  Exam: . Filed Vitals:   10/13/11 1450  BP: 120/74  Pulse: 84  Weight: 221 lb (100.245 kg)    In general, slightly sick appearing man.  H&N: supple, no meningismus, no nodes  Fundi:  appears to have spontaneous venous pulsations.  Mental status:   The patient is oriented to person, place and time. Recent and remote memory are intact. Attention span and concentration are normal. Language including repetition, naming, following commands are intact. Fund of knowledge of current and historical  events, as well as vocabulary are normal.  Cranial Nerves: Pupils are equally round and reactive to light. Visual fields full to confrontation. EOMs reveal an abnormality of abduction of the left eye.  Facial sensation and muscles of mastication are intact. Muscles of facial expression are symmetric. Hearing intact to bilateral finger rub. Tongue protrusion, uvula, palate midline.  Shoulder shrug intact  Motor:  Normal bulk and tone, no drift and 5/5 muscle strength bilaterally.  Reflexes:  3+ thoughout, toes down.  Coordination:  Normal finger to nose  Gait:  Normal gait and station.    Impression/Recommendations:  1.  Headache - I have concern that the patient has developed a chronic meningitis  given his immunesuppression. Certainly he also seems to have a new sixth nerve palsy which implicates either high CSF pressure or a focal abnormality of the sixth cranial nerve.  I would like to admit the patient to hospitalist service with the neurohospitalists consulting.  I would suggest an immediate MRI brain with and without contrast with thin cuts through the brainstem, ?new left sixth nerve palsy, as well as a lumbar puncture done under fluoroscopy.  With the following CSF studies:  Cell count and diff, Glucose, Protein, Lactate, gram stain and bacterial culture, AFB smear and AFB culture, fungal stain and fungal culture, HSV pcr, csf CrAG, CMV pcr, VZV pcr and IgG, VDR, ACE, Lyme Abs.  I will also see the patient after he is admitted to hospital.  Kavin Leech. [name], MD [facility], [place]

## 2011-10-14 ENCOUNTER — Inpatient Hospital Stay (HOSPITAL_COMMUNITY): Payer: 59

## 2011-10-14 ENCOUNTER — Encounter (HOSPITAL_COMMUNITY): Admission: RE | Admit: 2011-10-14 | Payer: 59 | Source: Ambulatory Visit

## 2011-10-14 ENCOUNTER — Encounter (HOSPITAL_COMMUNITY): Payer: Self-pay

## 2011-10-14 DIAGNOSIS — Z0279 Encounter for issue of other medical certificate: Secondary | ICD-10-CM

## 2011-10-14 DIAGNOSIS — G039 Meningitis, unspecified: Secondary | ICD-10-CM

## 2011-10-14 DIAGNOSIS — H532 Diplopia: Secondary | ICD-10-CM | POA: Diagnosis present

## 2011-10-14 LAB — CRYPTOCOCCAL ANTIGEN, CSF

## 2011-10-14 LAB — CSF CELL COUNT WITH DIFFERENTIAL
RBC Count, CSF: 0 /mm3
WBC, CSF: 1 /mm3 (ref 0–5)

## 2011-10-14 LAB — PROTEIN AND GLUCOSE, CSF
Glucose, CSF: 61 mg/dL (ref 43–76)
Total  Protein, CSF: 38 mg/dL (ref 15–45)

## 2011-10-14 MED ORDER — GADOBENATE DIMEGLUMINE 529 MG/ML IV SOLN
20.0000 mL | Freq: Once | INTRAVENOUS | Status: AC
  Administered 2011-10-14: 20 mL via INTRAVENOUS

## 2011-10-14 MED ORDER — LORAZEPAM 2 MG/ML IJ SOLN
1.0000 mg | Freq: Once | INTRAMUSCULAR | Status: AC
  Administered 2011-10-14: 1 mg via INTRAVENOUS

## 2011-10-14 MED ORDER — PAROXETINE HCL 20 MG PO TABS
40.0000 mg | ORAL_TABLET | Freq: Every day | ORAL | Status: DC
  Administered 2011-10-14 – 2011-10-15 (×2): 40 mg via ORAL
  Filled 2011-10-14 (×2): qty 2

## 2011-10-14 MED ORDER — IRBESARTAN 150 MG PO TABS
150.0000 mg | ORAL_TABLET | Freq: Every day | ORAL | Status: DC
  Administered 2011-10-14 – 2011-10-15 (×2): 150 mg via ORAL
  Filled 2011-10-14 (×2): qty 1

## 2011-10-14 MED ORDER — LORAZEPAM 2 MG/ML IJ SOLN
INTRAMUSCULAR | Status: AC
  Administered 2011-10-14: 1 mg via INTRAVENOUS
  Filled 2011-10-14: qty 1

## 2011-10-14 MED ORDER — AMLODIPINE BESYLATE 5 MG PO TABS
5.0000 mg | ORAL_TABLET | Freq: Every day | ORAL | Status: DC
  Administered 2011-10-14 – 2011-10-15 (×2): 5 mg via ORAL
  Filled 2011-10-14 (×2): qty 1

## 2011-10-14 MED ORDER — METHADONE HCL 10 MG PO TABS
10.0000 mg | ORAL_TABLET | Freq: Three times a day (TID) | ORAL | Status: DC
  Administered 2011-10-14 – 2011-10-15 (×5): 10 mg via ORAL
  Filled 2011-10-14 (×5): qty 1

## 2011-10-14 MED ORDER — OXYCODONE HCL 5 MG PO TABS
20.0000 mg | ORAL_TABLET | ORAL | Status: DC | PRN
  Administered 2011-10-14: 20 mg via ORAL
  Filled 2011-10-14: qty 4

## 2011-10-14 MED ORDER — SODIUM CHLORIDE 0.9 % IV SOLN
Freq: Once | INTRAVENOUS | Status: AC
  Administered 2011-10-14: 10:00:00 via INTRAVENOUS

## 2011-10-14 NOTE — Progress Notes (Signed)
INITIAL ADULT NUTRITION ASSESSMENT Date: 10/14/2011   Time: 12:11 PM Reason for Assessment: Nutrition Risk  ASSESSMENT: Male 36 y.o.  Dx: headache, diplopia  Hx:  Past Medical History  Diagnosis Date  . Obstructive sleep apnea (adult) (pediatric)   . Anxiety and depression   . Essential hypertension, benign   . Obesity, unspecified   . Herpes zoster infection 07/2010, 12/2010    with post-herpetic neuralgia  . Vitamin d deficiency   . Crohn's disease   . Hyperlipidemia   . CMV (cytomegalovirus infection) 10/28/2010  . Nephrolithiasis   . Fistula, perirectal     history of  . Sinus infection 08/05/11    Related Meds:     . sodium chloride   Intravenous Q6 weeks  . sodium chloride   Intravenous Once  . acetaminophen  650 mg Oral Q6 weeks  . amLODipine  5 mg Oral Daily  . diphenhydrAMINE  50 mg Oral Q6 weeks  . inFLIXimab (REMICADE) infusion  500 mg Intravenous Q6 weeks  . irbesartan  150 mg Oral Daily  . methadone  10 mg Oral TID  . PARoxetine  40 mg Oral Daily  . sodium chloride  3 mL Intravenous Q12H  . sodium chloride  3 mL Intravenous Q12H  . tuberculin  5 Units Intradermal Once  . DISCONTD: sodium chloride   Intravenous Q6 weeks  . DISCONTD: acetaminophen  650 mg Oral Q6 weeks  . DISCONTD: diphenhydrAMINE  50 mg Oral Q6 weeks  . DISCONTD: inFLIXimab (REMICADE) infusion  5 mg/kg Intravenous Q6 weeks     Ht: 5' 9"  (175.3 cm)  Wt: 219 lb 9.3 oz (99.6 kg)  Ideal Wt: 72.7 kg % Ideal Wt: 137%  Usual Wt: 103 kg % Usual Wt: 97%  Body mass index is 32.43 kg/(m^2). Obesity  Food/Nutrition Related Hx: Pt has been vomiting after some meals, not had a good appetite for about 2 weeks. Lost ~10 lbs per pt report Labs:  CMP     Component Value Date/Time   NA 140 10/13/2011 2142   K 4.3 10/13/2011 2142   CL 101 10/13/2011 2142   CO2 32 10/13/2011 2142   GLUCOSE 102* 10/13/2011 2142   BUN 11 10/13/2011 2142   CREATININE 0.77 10/13/2011 2142   CALCIUM 9.5 10/13/2011 2142     PROT 7.3 10/13/2011 2142   ALBUMIN 3.7 10/13/2011 2142   AST 41* 10/13/2011 2142   ALT 46 10/13/2011 2142   ALKPHOS 79 10/13/2011 2142   BILITOT 0.2* 10/13/2011 2142   GFRNONAA >90 10/13/2011 2142   GFRAA >90 10/13/2011 2142    Intake/Output Summary (Last 24 hours) at 10/14/11 1214 Last data filed at 10/14/11 0900  Gross per 24 hour  Intake      0 ml  Output      0 ml  Net      0 ml     Diet Order: NPO  Supplements/Tube Feeding: none  IVF:    Estimated Nutritional Needs:   Kcal: 2000-2200 Protein: 75-85 gm  Fluid:  > 2 L  Pt is awaiting MRI of brain and LP. Has lost 7 lbs (3%) in two weeks from vomiting and poor appetite r/t headache.Pt started on oxycodone and methadone for pain. Anticipate return of appetite and weight once diet is advanced.  NUTRITION DIAGNOSIS: -Inadequate oral intake (NI-2.1).  Status: Ongoing  RELATED TO: N/V  AS EVIDENCE BY: 7 lb weight loss in 2 weeks  MONITORING/EVALUATION(Goals): Goal: diet will advance to be able to  provide adequate nutrition intake Monitor: diet advance, po intake, weight, labs, MRI/LP results  EDUCATION NEEDS: -No education needs identified at this time  INTERVENTION: 1. No nutrition interventions at this time 2. RD will re-evaluate needs once diet is advanced.  3. RD will continue to follow  Dietitian [phone]  [place] Per approved criteria  -Obesity Unspecified    [name] [name] [name] [name] 10/14/2011, 12:11 PM

## 2011-10-14 NOTE — Progress Notes (Signed)
Outpatient neuro  Subjective: Patient continues to have chills overnight.  Headache unchanged.  No nausea noted.  Still continues to have double vision.  Objective: Vital signs in last 24 hours: Temp:  [98.1 F (36.7 C)-98.3 F (36.8 C)] 98.3 F (36.8 C) (04/24 0500) Pulse Rate:  [59-84] 59  (04/24 0500) Resp:  [18-20] 20  (04/24 0500) BP: (119-128)/(74-76) 119/75 mmHg (04/24 0500) SpO2:  [95 %] 95 % (04/24 0500) Weight:  [219 lb 9.3 oz (99.6 kg)-221 lb (100.245 kg)] 219 lb 9.3 oz (99.6 kg) (04/23 2300)     Nutritional status: NPO  General exam: no acute distress. Cardiac exam: normal S1 and S2, Regular rate and rhythm. Respiratory exam: lungs were clear to auscultation. Neurological exam: AAO*3. No aphasia.  Was able to tell me months of the year forwards and backwards correctly, exhibiting good attention span. Recall was 3 of 3 after 5 minutes. Followed complex commands. Cranial nerves: bilateral abduction deficits, PERRL. Visual fields were full. Sensation to V1 through V3 areas of the face was intact and symmetric throughout. There was no facial asymmetry. Hearing to finger rub was equal and symmetrical bilaterally. Shoulder shrug was 5/5 and symmetric bilaterally. Head rotation was 5/5 bilaterally. There was no dysarthria or palatal deviation. Eliciting maneuvers: Straight leg raise was negative bilaterally. Spurling maneuvers were performed in the patient and were negative. The patient also had Tinel's and Phalen's maneuvers performed at the wrists, which were negative. Motor: strength was 5/5 and symmetric throughout.  Coordination: finger-to-nose and heel-to-shin were intact and symmetric bilaterally. Reflexes: were 2+ in upper extremities and 3+ at the knees and 3+ at the ankles. Plantar response was downgoing bilaterally.   Lab Results:  [facility] 10/13/11 2142  WBC 7.8  HGB 13.7  HCT 41.3  PLT 267  NA 140  K 4.3  CL 101  CO2 32  GLUCOSE 102*  BUN 11  CREATININE 0.77    CALCIUM 9.5  LABA1C --    Studies/Results: X-ray Chest Pa And Lateral   10/13/2011  *RADIOLOGY REPORT*  Clinical Data: Headaches.  CHEST - 2 VIEW  Comparison: 10/21/2010  Findings: Normal heart size and pulmonary vascularity.  No focal airspace consolidation in the lungs.  No blunting of costophrenic angles.  No pneumothorax.  No significant change since the previous study.  IMPRESSION: No evidence of active pulmonary disease.  Original Report Authenticated By: Neale Burly, M.D.   Ct Head Wo Contrast  10/12/2011  *RADIOLOGY REPORT*  Clinical Data: Headache, dizziness, nausea  CT HEAD WITHOUT CONTRAST  Technique:  Contiguous axial images were obtained from the base of the skull through the vertex without contrast.  Comparison: 07/02/2007  Findings: There is no evidence of acute intracranial hemorrhage, brain edema, mass lesion, acute infarction,   mass effect, or midline shift. Acute infarct may be inapparent on noncontrast CT. No other intra-axial abnormalities are seen, and the ventricles and sulci are within normal limits in size and symmetry.   No abnormal extra-axial fluid collections or masses are identified.  No significant calvarial abnormality.  IMPRESSION: 1. Negative for bleed or other acute intracranial process.  Original Report Authenticated By: Trecia Rogers, M.D.    Medications: I have reviewed the patient's current medications.  Assessment/Plan: 1.  Headache - possible chronic meningitis.  Getting MRI brain with and without contrast as well as LP today.  Will start oxycodone 11m q6h prn for headache.  Also, will get ID consult re other labs to add to LP/CSF 2.  Post-herpetic neuralgia.  Will restart methadone 11m tid.    LOS: 1 day   [name] [name]

## 2011-10-14 NOTE — Progress Notes (Signed)
Subjective: Patient reports continued headache but improved.  Rates headache now at a 2/10.  Continues to have diplopia with lateral vision.   MRI and LP pending.    Objective: Current vital signs: BP 119/75  Pulse 59  Temp(Src) 98.3 F (36.8 C) (Oral)  Resp 20  Ht 5' 9"  (1.753 m)  Wt 99.6 kg (219 lb 9.3 oz)  BMI 32.43 kg/m2  SpO2 95% Vital signs in last 24 hours: Temp:  [98.1 F (36.7 C)-98.3 F (36.8 C)] 98.3 F (36.8 C) (04/24 0500) Pulse Rate:  [59-84] 59  (04/24 0500) Resp:  [18-20] 20  (04/24 0500) BP: (119-128)/(74-76) 119/75 mmHg (04/24 0500) SpO2:  [95 %] 95 % (04/24 0500) Weight:  [99.6 kg (219 lb 9.3 oz)-100.245 kg (221 lb)] 99.6 kg (219 lb 9.3 oz) (04/23 2300)  Intake/Output from previous day:   Intake/Output this shift:   Nutritional status: NPO  Neurologic Exam: Mental Status:  Alert, oriented, thought content appropriate. Speech fluent without evidence of aphasia. Able to follow commands without difficulty.  Cranial Nerves:  II-Visual fields were normal.  III/IV/VI-Pupils were equal and reacted. Extraocular movements were full and conjugate, although patient had subjective diplopia on right and left lateral gaze (objects side-by-side).  V/VII-no facial numbness and no facial weakness.  VIII-normal.  X-normal speech and symmetrical palatal movement.  XII-midline tongue extension  Motor: 5/5 bilaterally with normal tone and bulk  Sensory: Normal throughout.  Deep Tendon Reflexes: 2+ and brisk and symmetric.  Plantars: Flexor bilaterally  Cerebellar: Normal finger-to-nose testing.    Lab Results: Results for orders placed during the hospital encounter of 10/13/11 (from the past 48 hour(s))  COMPREHENSIVE METABOLIC PANEL     Status: Abnormal   Collection Time   10/13/11  9:42 PM      Component Value Range Comment   Sodium 140  135 - 145 (mEq/L)    Potassium 4.3  3.5 - 5.1 (mEq/L)    Chloride 101  96 - 112 (mEq/L)    CO2 32  19 - 32 (mEq/L)    Glucose, Bld 102 (*) 70 - 99 (mg/dL)    BUN 11  6 - 23 (mg/dL)    Creatinine, Ser 0.77  0.50 - 1.35 (mg/dL)    Calcium 9.5  8.4 - 10.5 (mg/dL)    Total Protein 7.3  6.0 - 8.3 (g/dL)    Albumin 3.7  3.5 - 5.2 (g/dL)    AST 41 (*) 0 - 37 (U/L)    ALT 46  0 - 53 (U/L)    Alkaline Phosphatase 79  39 - 117 (U/L)    Total Bilirubin 0.2 (*) 0.3 - 1.2 (mg/dL)    GFR calc non Af Amer >90  >90 (mL/min)    GFR calc Af Amer >90  >90 (mL/min)   CBC     Status: Normal   Collection Time   10/13/11  9:42 PM      Component Value Range Comment   WBC 7.8  4.0 - 10.5 (K/uL)    RBC 4.41  4.22 - 5.81 (MIL/uL)    Hemoglobin 13.7  13.0 - 17.0 (g/dL)    HCT 41.3  39.0 - 52.0 (%)    MCV 93.7  78.0 - 100.0 (fL)    MCH 31.1  26.0 - 34.0 (pg)    MCHC 33.2  30.0 - 36.0 (g/dL)    RDW 14.1  11.5 - 15.5 (%)    Platelets 267  150 - 400 (K/uL)   DIFFERENTIAL  Status: Abnormal   Collection Time   10/13/11  9:42 PM      Component Value Range Comment   Neutrophils Relative 36 (*) 43 - 77 (%)    Neutro Abs 2.8  1.7 - 7.7 (K/uL)    Lymphocytes Relative 45  12 - 46 (%)    Lymphs Abs 3.5  0.7 - 4.0 (K/uL)    Monocytes Relative 14 (*) 3 - 12 (%)    Monocytes Absolute 1.1 (*) 0.1 - 1.0 (K/uL)    Eosinophils Relative 4  0 - 5 (%)    Eosinophils Absolute 0.3  0.0 - 0.7 (K/uL)    Basophils Relative 0  0 - 1 (%)    Basophils Absolute 0.0  0.0 - 0.1 (K/uL)     Studies/Results: X-ray Chest Pa And Lateral   10/13/2011  *RADIOLOGY REPORT*  Clinical Data: Headaches.  CHEST - 2 VIEW  Comparison: 10/21/2010  Findings: Normal heart size and pulmonary vascularity.  No focal airspace consolidation in the lungs.  No blunting of costophrenic angles.  No pneumothorax.  No significant change since the previous study.  IMPRESSION: No evidence of active pulmonary disease.  Original Report Authenticated By: Neale Burly, M.D.   Ct Head Wo Contrast  10/12/2011  *RADIOLOGY REPORT*  Clinical Data: Headache, dizziness, nausea  CT  HEAD WITHOUT CONTRAST  Technique:  Contiguous axial images were obtained from the base of the skull through the vertex without contrast.  Comparison: 07/02/2007  Findings: There is no evidence of acute intracranial hemorrhage, brain edema, mass lesion, acute infarction,   mass effect, or midline shift. Acute infarct may be inapparent on noncontrast CT. No other intra-axial abnormalities are seen, and the ventricles and sulci are within normal limits in size and symmetry.   No abnormal extra-axial fluid collections or masses are identified.  No significant calvarial abnormality.  IMPRESSION: 1. Negative for bleed or other acute intracranial process.  Original Report Authenticated By: Trecia Rogers, M.D.   Mr Jeri Cos IE Contrast  10/14/2011  *RADIOLOGY REPORT*  Clinical Data: Herpes zoster infection.  Evaluate for meningeal involvement.  MRI HEAD WITHOUT AND WITH CONTRAST  Technique:  Multiplanar, multiecho pulse sequences of the brain and surrounding structures were obtained according to standard protocol without and with intravenous contrast  Contrast: 29m MULTIHANCE GADOBENATE DIMEGLUMINE 529 MG/ML IV SOLN  Comparison: 10/12/2011.  Findings: There is no evidence for acute infarction, intracranial hemorrhage, mass lesion, hydrocephalus, or extra-axial fluid. There is no significant atrophy or white matter disease.  The major intracranial vascular structures appear widely patent.  Post contrast, there is no abnormal enhancement of the brain or meninges.  Cavernous sinuses.  Normal.  Slight empty sella. Negative pineal and cerebellar tonsils.  Unremarkable orbits, sinuses, and mastoids. Compared with prior CT, there is no change.  IMPRESSION: No acute intracranial findings.  Slight empty sella.  No abnormal post contrast enhancement.  Original Report Authenticated By: JStaci Righter M.D.    Medications:  I have reviewed the patient's current medications. Scheduled:   . sodium chloride   Intravenous  Q6 weeks  . sodium chloride   Intravenous Once  . acetaminophen  650 mg Oral Q6 weeks  . amLODipine  5 mg Oral Daily  . diphenhydrAMINE  50 mg Oral Q6 weeks  . gadobenate dimeglumine  20 mL Intravenous Once  . inFLIXimab (REMICADE) infusion  500 mg Intravenous Q6 weeks  . irbesartan  150 mg Oral Daily  . LORazepam  1 mg  Intravenous Once  . methadone  10 mg Oral TID  . PARoxetine  40 mg Oral Daily  . sodium chloride  3 mL Intravenous Q12H  . sodium chloride  3 mL Intravenous Q12H  . tuberculin  5 Units Intradermal Once  . DISCONTD: sodium chloride   Intravenous Q6 weeks  . DISCONTD: acetaminophen  650 mg Oral Q6 weeks  . DISCONTD: diphenhydrAMINE  50 mg Oral Q6 weeks  . DISCONTD: inFLIXimab (REMICADE) infusion  5 mg/kg Intravenous Q6 weeks    Assessment/Plan:  Patient Active Hospital Problem List: Headache with diplopia   Assessment:  Headache improved but diplopia continues.  Methadone has been increased.  On OxyIR and Norco as well.  Being used for headache and post herpetic neuralgia pain.  MRI performed and shows no acute abnormalities or any abnormal enhancement.     Plan: 1.  LP pending-orders written for what studies CSF to be sent for.    2.  With increase in Methadone will discontinue OxyIR but continue Norco prn.    3.  Ativan for claustrophobia   LOS: 1 day   Alexis Goodell, MD [facility] [phone] 10/14/2011  2:17 PM

## 2011-10-14 NOTE — Procedures (Signed)
Lumbar puncture performed at O0-3 without complication. Approximately 28 ml clear CSF obtained and sent to laboratory per requesting physician's instructions. Full report to follow in radiology.

## 2011-10-14 NOTE — Consult Note (Signed)
Date of Admission:  10/13/2011  Date of Consult:  10/14/2011  Reason for Consult:? Possible chronic meningitis, CNS infection in pt with Crohns on TNF blockade Referring Physician: Dr. [name]   HPI: [name] [name] is an 36 y.o. male who I met at [facility] in April of 2012 when he had FUO and diarrheal illness. During that admission he was found to be CMV viremic and was treated with clinical improvement with valcyte. He also had a lung nodule that we worked up with serologies at that time and PET scan and EUS biopsy had been considered. He has had problems with VZV zoster and postherpetic neuralgia and I saw pt in [place] [facility] as well with question of whether pt could get zostavax vaccine. He has been followed closely by Dr. [name] in Neurology for his postherpetic Neurology. Over the past  Month he has had a severe headache on top of his head that increases in severity as he gets up to use the bathroom and is accompanied by severe rushing sound in his ears. Pain becomes so severe that he breaks out in profuse drenching sweats. He has not had much in way of overt fevers but may have had some weight loss. He has developed worsening diplopia with lateral gaze. Dr. [name] was concerned about a possible chronic meningitis given pts constellation of symptoms and his high risk for pathogens such as TB and dimorphic fungi given his TNF alpha therapy and his recent bouts with CMV and VZV infection. Large volume LP today shows only 1 to 2 wbc and unremarkable CSF protein and glucose. MRI PRE LP is also normal. Approximately 33m of CSF was removed to facilitate requisite 8+ml of CSF centrifuged for sediment for AFB and separate 810mCSF centrifuged for sediment for fungal cultures. CSF has also been sent for crypto ag, CMV and HSV by PCR as well as VDRL. There should be ample left for additional studies if necessary.    Past Medical History  Diagnosis Date  . Obstructive sleep apnea (adult) (pediatric)   .  Anxiety and depression   . Essential hypertension, benign   . Obesity, unspecified   . Herpes zoster infection 07/2010, 12/2010    with post-herpetic neuralgia  . Vitamin d deficiency   . Crohn's disease   . Hyperlipidemia   . CMV (cytomegalovirus infection) 10/28/2010  . Nephrolithiasis   . Fistula, perirectal     history of  . Sinus infection 08/05/11    Past Surgical History  Procedure Date  . Small intestine surgery     1 ft removed, ileo-cecectomy  . Hypospadias correction     Childhood   . Upper gastrointestinal endoscopy 10/15/2010    w/biopsy, mild gastritis and duodenitis  . Colonoscopy 10/14/2007    crohn's colitis, aphthous ulcers, mild anal stenosis  ergies:   No Known Allergies   Medications: I have reviewed patients current medications as documented in Epic Anti-infectives    None      Social History:  reports that he has never smoked. He has never used smokeless tobacco. He reports that he does not drink alcohol or use illicit drugs.  Family History  Problem Relation Age of Onset  . Heart disease Father     CAD/MI  . Hyperlipidemia Father   . Hypertension Father   . Gout Father   . Sleep apnea Father   . Colon cancer Neg Hx   . Diabetes Neg Hx   . COPD Neg Hx   .  Obesity Mother   . Allergies Mother   . Cancer Mother   . Stroke Maternal Grandfather   . Allergies Sister   . Asthma Sister     As in HPI and primary teams notes otherwise 12 point review of systems is negative  Blood pressure 118/68, pulse 66, temperature 97.3 F (36.3 C), temperature source Oral, resp. rate 18, height 5' 9"  (1.753 m), weight 219 lb 9.3 oz (99.6 kg), SpO2 98.00%. General: Alert and awake, oriented x3, not in any acute distress. lyiing flat on bed post LP HEENT: anicteric sclera, pupils reactive to light and accommodation, EOMI, oropharynx clear and without exudate CVS regular rate, normal r,  no murmur rubs or gallops Chest: clear to auscultation bilaterally, no  wheezing, rales or rhonchi Abdomen: soft nontender, nondistended, normal bowel sounds, Extremities: no  clubbing or edema noted bilaterally Skin: no rashes Neuro: nonfocal, strength and sensation intact   Results for orders placed during the hospital encounter of 10/13/11 (from the past 48 hour(s))  COMPREHENSIVE METABOLIC PANEL     Status: Abnormal   Collection Time   10/13/11  9:42 PM      Component Value Range Comment   Sodium 140  135 - 145 (mEq/L)    Potassium 4.3  3.5 - 5.1 (mEq/L)    Chloride 101  96 - 112 (mEq/L)    CO2 32  19 - 32 (mEq/L)    Glucose, Bld 102 (*) 70 - 99 (mg/dL)    BUN 11  6 - 23 (mg/dL)    Creatinine, Ser 0.77  0.50 - 1.35 (mg/dL)    Calcium 9.5  8.4 - 10.5 (mg/dL)    Total Protein 7.3  6.0 - 8.3 (g/dL)    Albumin 3.7  3.5 - 5.2 (g/dL)    AST 41 (*) 0 - 37 (U/L)    ALT 46  0 - 53 (U/L)    Alkaline Phosphatase 79  39 - 117 (U/L)    Total Bilirubin 0.2 (*) 0.3 - 1.2 (mg/dL)    GFR calc non Af Amer >90  >90 (mL/min)    GFR calc Af Amer >90  >90 (mL/min)   CBC     Status: Normal   Collection Time   10/13/11  9:42 PM      Component Value Range Comment   WBC 7.8  4.0 - 10.5 (K/uL)    RBC 4.41  4.22 - 5.81 (MIL/uL)    Hemoglobin 13.7  13.0 - 17.0 (g/dL)    HCT 41.3  39.0 - 52.0 (%)    MCV 93.7  78.0 - 100.0 (fL)    MCH 31.1  26.0 - 34.0 (pg)    MCHC 33.2  30.0 - 36.0 (g/dL)    RDW 14.1  11.5 - 15.5 (%)    Platelets 267  150 - 400 (K/uL)   DIFFERENTIAL     Status: Abnormal   Collection Time   10/13/11  9:42 PM      Component Value Range Comment   Neutrophils Relative 36 (*) 43 - 77 (%)    Neutro Abs 2.8  1.7 - 7.7 (K/uL)    Lymphocytes Relative 45  12 - 46 (%)    Lymphs Abs 3.5  0.7 - 4.0 (K/uL)    Monocytes Relative 14 (*) 3 - 12 (%)    Monocytes Absolute 1.1 (*) 0.1 - 1.0 (K/uL)    Eosinophils Relative 4  0 - 5 (%)    Eosinophils Absolute 0.3  0.0 - 0.7 (  K/uL)    Basophils Relative 0  0 - 1 (%)    Basophils Absolute 0.0  0.0 - 0.1 (K/uL)   CSF  CELL COUNT WITH DIFFERENTIAL     Status: Abnormal   Collection Time   10/14/11  2:55 PM      Component Value Range Comment   Tube # 4      Color, CSF COLORLESS  COLORLESS     Appearance, CSF CLEAR (*) CLEAR     Supernatant NOT INDICATED      RBC Count, CSF 0  0 (/cu mm)    WBC, CSF 1  0 - 5 (/cu mm)    Segmented Neutrophils-CSF TOO FEW TO COUNT, SMEAR AVAILABLE FOR REVIEW  0 - 6 (%)    Lymphs, CSF RARE  40 - 80 (%)    Monocyte-Macrophage-Spinal Fluid RARE  15 - 45 (%)   CSF CELL COUNT WITH DIFFERENTIAL     Status: Abnormal   Collection Time   10/14/11  2:55 PM      Component Value Range Comment   Tube # 1      Color, CSF COLORLESS  COLORLESS     Appearance, CSF CLEAR (*) CLEAR     Supernatant NOT INDICATED      RBC Count, CSF 0  0 (/cu mm)    WBC, CSF 2  0 - 5 (/cu mm)    Segmented Neutrophils-CSF TOO FEW TO COUNT, SMEAR AVAILABLE FOR REVIEW  0 - 6 (%)    Lymphs, CSF RARE  40 - 80 (%)    Monocyte-Macrophage-Spinal Fluid RARE  15 - 45 (%)   PROTEIN AND GLUCOSE, CSF     Status: Normal   Collection Time   10/14/11  2:55 PM      Component Value Range Comment   Glucose, CSF 61  43 - 76 (mg/dL)    Total  Protein, CSF 38  15 - 45 (mg/dL)       Component Value Date/Time   SDES BLOOD RIGHT HAND 10/15/2010 2105   SPECREQUEST BOTTLES DRAWN AEROBIC AND ANAEROBIC 5 CC EACH 10/15/2010 2105   CULT NO GROWTH 5 DAYS 10/15/2010 2105   REPTSTATUS 10/22/2010 FINAL 10/15/2010 2105   X-ray Chest Pa And Lateral   10/13/2011  *RADIOLOGY REPORT*  Clinical Data: Headaches.  CHEST - 2 VIEW  Comparison: 10/21/2010  Findings: Normal heart size and pulmonary vascularity.  No focal airspace consolidation in the lungs.  No blunting of costophrenic angles.  No pneumothorax.  No significant change since the previous study.  IMPRESSION: No evidence of active pulmonary disease.  Original Report Authenticated By: Neale Burly, M.D.   Mr Jeri Cos EH Contrast  10/14/2011  *RADIOLOGY REPORT*  Clinical Data: Herpes  zoster infection.  Evaluate for meningeal involvement.  MRI HEAD WITHOUT AND WITH CONTRAST  Technique:  Multiplanar, multiecho pulse sequences of the brain and surrounding structures were obtained according to standard protocol without and with intravenous contrast  Contrast: 36m MULTIHANCE GADOBENATE DIMEGLUMINE 529 MG/ML IV SOLN  Comparison: 10/12/2011.  Findings: There is no evidence for acute infarction, intracranial hemorrhage, mass lesion, hydrocephalus, or extra-axial fluid. There is no significant atrophy or white matter disease.  The major intracranial vascular structures appear widely patent.  Post contrast, there is no abnormal enhancement of the brain or meninges.  Cavernous sinuses.  Normal.  Slight empty sella. Negative pineal and cerebellar tonsils.  Unremarkable orbits, sinuses, and mastoids. Compared with prior CT, there is no change.  IMPRESSION: No acute intracranial findings.  Slight empty sella.  No abnormal post contrast enhancement.  Original Report Authenticated By: Staci Righter, M.D.     No results found for this or any previous visit (from the past 720 hour(s)).   Impression/Recommendation  36 year old with Crohns disease prior CMV viremia and concern for colitis, prior lung nodule admitted with severe headaches with drenching sweats (from pain he believes) and diplopia  1) Rule out CNS infection: Now that we have the LP results with normal WBC the pt does not have evidence for an acute or chronic meningitis. THere are a few exceptions in that cryptococcus can present with normal CSF parameters but the pending CSF crypto ag will rule in or out that diagnosis. We have the large volumes needed to culture for AFB and fungal infection should he have had CSF showing meningitis. I am not terribly suspicious for CMV encephalitis at this point given absence of confusion fevers. CSF for CMV is sent however. Similarly VZV would be VERY unusual to cause his constellation of symptoms this  far out from most recent flare of zoster and with benign MRI. Again HSV 1 encephalitis not in the picture with pristine temporal lobes and no confusion or fevers. I think checking serum RPR and CSF VDRL are reasonable. WIth normal wbc and protein no need to go for FTA abs if these tests are negative.    Thank you so much for this interesting consult,   Alcide Evener 10/14/2011, 6:07 PM   [phone] (pager) [phone] (office)

## 2011-10-15 ENCOUNTER — Inpatient Hospital Stay (HOSPITAL_COMMUNITY): Payer: 59

## 2011-10-15 ENCOUNTER — Encounter (HOSPITAL_COMMUNITY): Payer: Self-pay | Admitting: Radiology

## 2011-10-15 LAB — HERPES SIMPLEX VIRUS(HSV) DNA BY PCR: HSV 2 DNA: NOT DETECTED

## 2011-10-15 LAB — HIV ANTIBODY (ROUTINE TESTING W REFLEX): HIV: NONREACTIVE

## 2011-10-15 MED ORDER — IOHEXOL 350 MG/ML SOLN
50.0000 mL | Freq: Once | INTRAVENOUS | Status: AC | PRN
  Administered 2011-10-15: 50 mL via INTRAVENOUS

## 2011-10-15 MED ORDER — AMITRIPTYLINE HCL 25 MG PO TABS: ORAL_TABLET | ORAL | Status: DC

## 2011-10-15 NOTE — Progress Notes (Signed)
1800 Patient apprehensive about going home and starting a new medication several attempts placed to Dr. Doy Mince and no return call, als attempt to paged Dr. [name] and no return call. Charge nurse made aware.

## 2011-10-15 NOTE — Progress Notes (Signed)
Subjective: Still with headaches, I reviewed all his labs so far with him   Antibiotics:  Anti-infectives    None      Medications: Scheduled Meds:   . sodium chloride   Intravenous Q6 weeks  . acetaminophen  650 mg Oral Q6 weeks  . amLODipine  5 mg Oral Daily  . diphenhydrAMINE  50 mg Oral Q6 weeks  . inFLIXimab (REMICADE) infusion  500 mg Intravenous Q6 weeks  . irbesartan  150 mg Oral Daily  . methadone  10 mg Oral TID  . PARoxetine  40 mg Oral Daily  . sodium chloride  3 mL Intravenous Q12H  . sodium chloride  3 mL Intravenous Q12H   Continuous Infusions:  PRN Meds:.sodium chloride, acetaminophen, acetaminophen, HYDROcodone-acetaminophen, iohexol, ondansetron (ZOFRAN) IV, ondansetron, sodium chloride   Objective: Weight change:   Intake/Output Summary (Last 24 hours) at 10/15/11 2006 Last data filed at 10/15/11 1900  Gross per 24 hour  Intake   1440 ml  Output      2 ml  Net   1438 ml   Blood pressure 115/65, pulse 75, temperature 97.7 F (36.5 C), temperature source Oral, resp. rate 18, height 5' 9"  (1.753 m), weight 219 lb 9.3 oz (99.6 kg), SpO2 96.00%. Temp:  [97.7 F (36.5 C)-98.1 F (36.7 C)] 97.7 F (36.5 C) (04/25 1321) Pulse Rate:  [60-75] 75  (04/25 1321) Resp:  [18-20] 18  (04/25 1321) BP: (98-116)/(65-67) 115/65 mmHg (04/25 1321) SpO2:  [94 %-96 %] 96 % (04/25 1321)  Physical Exam: General: Alert and awake, oriented x3, not in any acute distress. lyiing flat on bed post LP  HEENT: anicteric sclera, pupils reactive to light and accommodation, EOMI, oropharynx clear and without exudate  CVS regular rate, normal r, no murmur rubs or gallops  Chest: clear to auscultation bilaterally, no wheezing, rales or rhonchi  Abdomen: soft nontender, nondistended, normal bowel sounds,  Extremities: no clubbing or edema noted bilaterally  Skin: no rashes  Neuro: nonfocal, strength and sensation intact  Lab Results:  Basename 10/13/11 2142  WBC 7.8  HGB  13.7  HCT 41.3  PLT 267    BMET  Basename 10/13/11 2142  NA 140  K 4.3  CL 101  CO2 32  GLUCOSE 102*  BUN 11  CREATININE 0.77  CALCIUM 9.5    Micro Results: Recent Results (from the past 240 hour(s))  CSF CULTURE     Status: Normal (Preliminary result)   Collection Time   10/14/11  2:55 PM      Component Value Range Status Comment   Specimen Description CSF   Final    Special Requests Immunocompromised TUBE2@8CC    Final    Gram Stain     Final    Value: CYTOSPIN NO WBC SEEN     NO ORGANISMS SEEN   Culture NO GROWTH   Final    Report Status PENDING   Incomplete   AFB CULTURE WITH SMEAR     Status: Normal (Preliminary result)   Collection Time   10/14/11  2:55 PM      Component Value Range Status Comment   Specimen Description CSF   Final    Special Requests Immunocompromised TUBE 2@8CC    Final    ACID FAST SMEAR NO ACID FAST BACILLI SEEN   Final    Culture     Final    Value: CULTURE WILL BE EXAMINED FOR 6 WEEKS BEFORE ISSUING A FINAL REPORT   Report Status PENDING  Incomplete     Studies/Results: Ct Angio Head W/cm &/or Wo Cm  10/15/2011  *RADIOLOGY REPORT*  Clinical Data:  Pulsatile tinnitus and headache.  Question AV fistula or venous thrombosis.  CT ANGIOGRAPHY HEAD  Technique:  Multidetector CT imaging of the head was performed using the standard protocol during bolus administration of intravenous contrast.  Multiplanar CT image reconstructions including MIPs were obtained to evaluate the vascular anatomy.  Contrast: 70m OMNIPAQUE IOHEXOL 350 MG/ML SOLN  Comparison:  MRI brain 10/14/2011.  Findings:  The precontrast images demonstrate no acute infarct, hemorrhage, or mass lesion.  The ventricles are of normal size.  No significant extra-axial fluid collection is present.  The paranasal sinuses and mastoid air cells are clear.  The osseous skull is intact.  The arterial phase imaging demonstrates a normal appearance of the internal carotid arteries at the brain stem  bilaterally.  The right A1 segment is dominant.  The anterior communicating artery is patent.  There is some segmental attenuation of distal MCA branch vessels and ACA branch vessels bilaterally.  No significant proximal stenosis or occlusion is present.  The left vertebral artery is the dominant vessel.  The PICA origins are visualized and normal bilaterally.  The basilar artery is within normal limits.  The right posterior cerebral artery is of fetal type.  A small right P1 segment is evident.  There is segmental narrowing of the left superior cerebellar artery.  No dural fistula is evident.  The postcontrast images demonstrate no pathologic enhancement.  The dural sinuses are patent.   Review of the MIP images confirms the above findings.  IMPRESSION:  1.  Negative CTA of the head. 2.  The dural sinuses are patent. 3.  No evidence for AV fistula. 4.  Mild small vessel disease.  Original Report Authenticated By: [name] [name], M.D.   X-ray Chest Pa And Lateral   10/13/2011  *RADIOLOGY REPORT*  Clinical Data: Headaches.  CHEST - 2 VIEW  Comparison: 10/21/2010  Findings: Normal heart size and pulmonary vascularity.  No focal airspace consolidation in the lungs.  No blunting of costophrenic angles.  No pneumothorax.  No significant change since the previous study.  IMPRESSION: No evidence of active pulmonary disease.  Original Report Authenticated By: [name] Burly M.D.   Mr BJeri CosWUYContrast  10/14/2011  *RADIOLOGY REPORT*  Clinical Data: Herpes zoster infection.  Evaluate for meningeal involvement.  MRI HEAD WITHOUT AND WITH CONTRAST  Technique:  Multiplanar, multiecho pulse sequences of the brain and surrounding structures were obtained according to standard protocol without and with intravenous contrast  Contrast: 257mMULTIHANCE GADOBENATE DIMEGLUMINE 529 MG/ML IV SOLN  Comparison: 10/12/2011.  Findings: There is no evidence for acute infarction, intracranial hemorrhage, mass lesion,  hydrocephalus, or extra-axial fluid. There is no significant atrophy or white matter disease.  The major intracranial vascular structures appear widely patent.  Post contrast, there is no abnormal enhancement of the brain or meninges.  Cavernous sinuses.  Normal.  Slight empty sella. Negative pineal and cerebellar tonsils.  Unremarkable orbits, sinuses, and mastoids. Compared with prior CT, there is no change.  IMPRESSION: No acute intracranial findings.  Slight empty sella.  No abnormal post contrast enhancement.  Original Report Authenticated By: [name] RighterM.D.   Dg Fluoro Guide Lumbar Puncture  10/15/2011  *RADIOLOGY REPORT*  Clinical Data:  Headache.  DIAGNOSTIC LUMBAR PUNCTURE UNDER FLUOROSCOPIC GUIDANCE  Fluoroscopy time:  0.3 minutes.  Technique:  Informed consent was obtained from the patient prior  to the procedure, including potential complications of headache, allergy, and pain.   With the patient prone, the lower back was prepped with Betadine.  1% Lidocaine was used for local anesthesia. Lumbar puncture was performed at the L3-4 level using a 20 gauge needle with return of clear CSF with an opening pressure of 9 cm water.   28 ml of CSF were obtained for laboratory studies.  The patient tolerated the procedure well and there were no apparent complications.  IMPRESSION: Lumbar puncture without complication.  Specimens sent to the laboratory.  Original Report Authenticated By: [name] [name], M.D.      Assessment/Plan: [name] [name] is a 36 y.o. male with   with Crohns disease prior CMV viremia and concern for colitis, prior lung nodule admitted with severe headaches with drenching sweats (from pain he believes) and diplopia  CSF profile is benign  MRI benign  He is afebrile  I agree with DC from inpatient world  I am happy to have him followup with me to review ID labs as an outpatient and will flag my clinic manager to arrange fu with me   LOS: 2 days   Alcide Evener 10/15/2011, 8:06 PM

## 2011-10-15 NOTE — Progress Notes (Signed)
Subjective: Patient continues to have headache, although well controlled on Norco.  No significant nausea.  Continues to have pulsatile tinnitus when headache gets bad.  Still having double vision.  Objective: Vital signs in last 24 hours: Temp:  [97.3 F (36.3 C)-98.1 F (36.7 C)] 98.1 F (36.7 C) (04/25 0547) Pulse Rate:  [60-72] 60  (04/25 0547) Resp:  [18-20] 18  (04/25 0547) BP: (98-118)/(65-68) 98/65 mmHg (04/25 0547) SpO2:  [94 %-98 %] 94 % (04/25 0547)  Intake/Output from previous day: 04/24 0701 - 04/25 0700 In: 1505 [P.O.:360; I.V.:895; IV Piggyback:250] Out: 2 [Urine:2] Intake/Output this shift:   Nutritional status: General  General exam: no acute distress. GI: Abdomen was tender in lower abdomen.  . Neurological exam: AAO*3. No aphasia.  Followed complex commands. Cranial nerves: Reveal abnormality of abduction of left and right eye in lateral gaze, PERRL. Sensation to V1 through V3 areas of the face was intact and symmetric throughout. There was no facial asymmetry. Hearing to finger rub was equal and symmetrical bilaterally. Shoulder shrug was 5/5 and symmetric bilaterally. Head rotation was 5/5 bilaterally. There was no dysarthria or palatal deviation.Motor: strength was 5/5 and symmetric throughout. Coordination: finger-to-nose intact and symmetric bilaterally. Reflexes: were 2+ in upper extremities and 3+ in knees and ankles. Plantar response was downgoing bilaterally.   Lab Results:  [facility] 10/13/11 2142  WBC 7.8  HGB 13.7  HCT 41.3  PLT 267  NA 140  K 4.3  CL 101  CO2 32  GLUCOSE 102*  BUN 11  CREATININE 0.77  CALCIUM 9.5  LABA1C --   Lipid Panel No results found for this basename: CHOL,TRIG,HDL,CHOLHDL,VLDL,LDLCALC in the last 72 hours  Studies/Results: X-ray Chest Pa And Lateral   10/13/2011  *RADIOLOGY REPORT*  Clinical Data: Headaches.  CHEST - 2 VIEW  Comparison: 10/21/2010  Findings: Normal heart size and pulmonary vascularity.  No focal  airspace consolidation in the lungs.  No blunting of costophrenic angles.  No pneumothorax.  No significant change since the previous study.  IMPRESSION: No evidence of active pulmonary disease.  Original Report Authenticated By: Neale Burly, M.D.   Mr Jeri Cos DG Contrast  10/14/2011  *RADIOLOGY REPORT*  Clinical Data: Herpes zoster infection.  Evaluate for meningeal involvement.  MRI HEAD WITHOUT AND WITH CONTRAST  Technique:  Multiplanar, multiecho pulse sequences of the brain and surrounding structures were obtained according to standard protocol without and with intravenous contrast  Contrast: 6m MULTIHANCE GADOBENATE DIMEGLUMINE 529 MG/ML IV SOLN  Comparison: 10/12/2011.  Findings: There is no evidence for acute infarction, intracranial hemorrhage, mass lesion, hydrocephalus, or extra-axial fluid. There is no significant atrophy or white matter disease.  The major intracranial vascular structures appear widely patent.  Post contrast, there is no abnormal enhancement of the brain or meninges.  Cavernous sinuses.  Normal.  Slight empty sella. Negative pineal and cerebellar tonsils.  Unremarkable orbits, sinuses, and mastoids. Compared with prior CT, there is no change.  IMPRESSION: No acute intracranial findings.  Slight empty sella.  No abnormal post contrast enhancement.  Original Report Authenticated By: JStaci Righter M.D.   Dg Fluoro Guide Lumbar Puncture  10/15/2011  *RADIOLOGY REPORT*  Clinical Data:  Headache.  DIAGNOSTIC LUMBAR PUNCTURE UNDER FLUOROSCOPIC GUIDANCE  Fluoroscopy time:  0.3 minutes.  Technique:  Informed consent was obtained from the patient prior to the procedure, including potential complications of headache, allergy, and pain.   With the patient prone, the lower back was prepped with Betadine.  1% Lidocaine  was used for local anesthesia. Lumbar puncture was performed at the L3-4 level using a 20 gauge needle with return of clear CSF with an opening pressure of 9 cm water.    28 ml of CSF were obtained for laboratory studies.  The patient tolerated the procedure well and there were no apparent complications.  IMPRESSION: Lumbar puncture without complication.  Specimens sent to the laboratory.  Original Report Authenticated By: Arvid Right. Luther Parody, M.D.    Medicati2ons: I have reviewed the patient's current medications.  Assessment/Plan: 1.  Headache - Clearly not meningitis given benign tap and negative CrAG.  MRI was unremarkable.  Given the pulsatile tinnitus I am going to CTA/CTV to look for venous sinus thrombosis or AV fistula although this is unlikely.  I also am going to send off a CMV quantitative DNA titer given his previous infection.  However, this headache could be a)signs of a indolent system infection b)withdrawl from his opiates given he was off them for 9 days(but has been back on them for 7) because of nausea and vomiting or a primary headache disorder - I really don't have a good explanation.  If the patient's CTA/CTV is normal then he can be discharged home.   LOS: 2 days   Charlita Brian

## 2011-10-15 NOTE — Progress Notes (Signed)
Patient is concerned about being d/c tonight due to him not taking amitriptyline before.  Patient being d/c on amitriptyline for headaches. D/C order wrote by Dr. [name] today. Notified on call doctor for Dr. Doy Mince due to patient requesting to stay tonight due to new medication. On call MD stated that amitriptyline did not not start working till 7-10 days after starting medication.  MD stated there was no reason for patient to stay. Tried to contact Dr. [name] on multiple occassions. [facility] doesn't have number for Dr. [name] neurology. Patient has tried multiple times also to call Dr. Jodi Mourning office, patient receiving busy signal also. Phone number in epic has busy signal when dialed.  Told patient this information that unable to get in touch with Dr. [name]. Patient decided to go home. Hassan Buckler, Charge Nurse

## 2011-10-15 NOTE — Discharge Summary (Signed)
Physician Discharge Summary  Patient ID: [name] [name] MRN: [id number] DOB/AGE: [id number] y.o.  Admit date: 10/13/2011 Discharge date: 10/15/2011  Admission Diagnoses:  Headache  Discharge Diagnoses: Headache, Diplopia  Active Problems:  Diplopia   Discharged Condition: good  Hospital Course: Patient was admitted for new headache with a history of immunesuppression on Remicaide.  Question of possible meningitis.  CSF unremarkable.  MRI brain normal.  CTA/CTV normal.  No cause of his headache other than it started with nausea and vomiting that was similar to his symptoms when he had a CMV infection.  He continues to have profuse sweating.  CMV serum titers are outstanding as is fungal cultures on LP as well as AFB and bacterial cultures.   It is possible that this headache is a system infection(although given normal CBC this is felt to be less likely.)  I would like him to follow up with infectious diseases.  I am going to start him on Elavil 12m->50mg qhs to see if this might help his headache.  Consults: Infections Diseases  Significant Diagnostic Studies: MRI Brain, CTA/CTV head, LP - CSF   Discharge Exam: Blood pressure 115/65, pulse 75, temperature 97.7 F (36.5 C), temperature source Oral, resp. rate 18, height 5' 9"  (1.753 m), weight 219 lb 9.3 oz (99.6 kg), SpO2 96.00%. Normal neuro exam except abnormality of abduction of both eyes(that may be chronic) as well as diffusely brisk reflexes.  Disposition: 01-Home or Self Care  Discharge Orders    Future Appointments: Provider: Department: Dept Phone: Center:   10/26/2011 11:30 AM KKathee Delton MD [facility] [phone]     Future Orders Please Complete By Expires   Diet - low sodium heart healthy      Increase activity slowly        Medication List  As of 10/15/2011  5:07 PM   STOP taking these medications         oxycodone 30 MG immediate release tablet      sodium chloride 0.9 % SOLN with inFLIXimab  100 MG SOLR         TAKE these medications         acetaminophen 325 MG tablet   Commonly known as: TYLENOL   Take 650 mg by mouth as needed. PAIN      amitriptyline 25 MG tablet   Commonly known as: ELAVIL   start with 1 tab at night for 5 days, then increase to 2 tabs at night from then on.      amLODipine-olmesartan 5-40 MG per tablet   Commonly known as: AZOR   Take 1 tablet by mouth daily.      methadone 5 MG tablet   Commonly known as: DOLOPHINE   Take 2 tablets (10 mg total) by mouth 3 (three) times daily. increase to 2 tabs three times a day as directed.      ondansetron 4 MG tablet   Commonly known as: ZOFRAN   Take 1 tablet (4 mg total) by mouth every 8 (eight) hours as needed for nausea.      oxyCODONE-acetaminophen 10-325 MG per tablet   Commonly known as: PERCOCET   Take 1 tablet by mouth every 6 (six) hours as needed for pain.      PARoxetine 40 MG tablet   Commonly known as: PAXIL   TAKE 1 TABLET DAILY             Signed: Georgios Kina 10/15/2011, 5:07 PM

## 2011-10-15 NOTE — Progress Notes (Signed)
Utilization review completed. Rozanna Boer, RN, BSN.  10/15/11

## 2011-10-15 NOTE — Progress Notes (Signed)
Pt discharged to home. Reviewed medications to continue, outpatient MD appt. Pt left with clothes, shoes, phone. Pt had voiced concerns about leaving without a definite treatment for his presenting problem (headache, diplopia). Day RN, night RN and night charge nurse attempted to contact MD for more information but were unable to reach MD. Pt agreed to be seen outpatient instead.  Marchelle Gearing RN

## 2011-10-16 ENCOUNTER — Other Ambulatory Visit: Payer: Self-pay | Admitting: Neurology

## 2011-10-16 ENCOUNTER — Telehealth: Payer: Self-pay | Admitting: Neurology

## 2011-10-16 MED ORDER — OXYCODONE HCL 30 MG PO TABS: 30.0000 mg | ORAL_TABLET | Freq: Four times a day (QID) | ORAL | Status: DC | PRN

## 2011-10-16 MED ORDER — DEXAMETHASONE 2 MG PO TABS: ORAL_TABLET | ORAL | Status: AC

## 2011-10-16 MED ORDER — DEXAMETHASONE 2 MG PO TABS: ORAL_TABLET | ORAL | Status: DC

## 2011-10-16 NOTE — Telephone Encounter (Signed)
Called and spoke with the patient. [name] [name] states [name] [name] was discharged from the hospital yesterday. Started the Amitriptyline last night as Dr. [name] recommended. It did not help him to rest as [name] [name] was still awake at 3:30 this morning. Inability to sleep was not related to ill effects from the Amitriptyline. [name] [name] continues to have a HA--last night it was about a 4/10 and today it is a 7.5/10. Continues to have n/v; Zofran does help. [name] [name] states [name] [name] just feels bad and doesn't know what else to do or what to expect. [name] [name] would like to speak with Dr. [name]. I told the him I would let Dr. [name] know his situation. The patient was ok with this plan. Dr. [name], please advise.

## 2011-10-16 NOTE — Telephone Encounter (Signed)
Received a voice mail message from the patient at 12:02 pm stating he was " discharged from the hospital yesterday. Got a script for a new medication; vomited after taking my meds; up until about 3:30 this morning. Still having a HA and just don't know what is going on and what I need to do. Please call me."

## 2011-10-17 ENCOUNTER — Ambulatory Visit: Payer: 59 | Admitting: Family Medicine

## 2011-10-18 LAB — CSF CULTURE W GRAM STAIN

## 2011-10-20 LAB — B. BURGDORFI ANTIBODIES, CSF

## 2011-10-20 LAB — VARICELLA-ZOSTER BY PCR

## 2011-10-21 ENCOUNTER — Telehealth: Payer: Self-pay

## 2011-10-21 ENCOUNTER — Other Ambulatory Visit: Payer: Self-pay | Admitting: Internal Medicine

## 2011-10-21 NOTE — Telephone Encounter (Signed)
Good Morning!  May I refill pt's Paxil?  Thank you for addressing this.

## 2011-10-21 NOTE — Telephone Encounter (Signed)
Pt notified that Paxil refilled per Dr. Carlean Purl and that pt needs to come see [place] before August for f/u visit regarding his Crohn's.  Pt said no problem and will call back to make appt.

## 2011-10-21 NOTE — Telephone Encounter (Signed)
Ok to fill it for 3 months with 1 refill [name] [name] will need to see me this summer (by August) for routine Crohn's follow-up

## 2011-10-26 ENCOUNTER — Institutional Professional Consult (permissible substitution): Payer: 59 | Admitting: Pulmonary Disease

## 2011-10-29 LAB — MISCELLANEOUS TEST

## 2011-11-03 ENCOUNTER — Encounter: Payer: Self-pay | Admitting: Internal Medicine

## 2011-11-09 ENCOUNTER — Other Ambulatory Visit: Payer: Self-pay | Admitting: Neurology

## 2011-11-09 MED ORDER — OXYCODONE-ACETAMINOPHEN 10-325 MG PO TABS: 1.0000 | ORAL_TABLET | Freq: Four times a day (QID) | ORAL | Status: DC | PRN

## 2011-11-09 MED ORDER — METHADONE HCL 5 MG PO TABS: 10.0000 mg | ORAL_TABLET | Freq: Three times a day (TID) | ORAL | Status: DC

## 2011-11-09 NOTE — Telephone Encounter (Signed)
Pt needs refills on Methadone and Percocet.

## 2011-11-11 LAB — FUNGUS CULTURE W SMEAR: Fungal Smear: NONE SEEN

## 2011-11-14 ENCOUNTER — Encounter (HOSPITAL_COMMUNITY): Payer: Self-pay

## 2011-11-14 ENCOUNTER — Emergency Department (INDEPENDENT_AMBULATORY_CARE_PROVIDER_SITE_OTHER)
Admission: EM | Admit: 2011-11-14 | Discharge: 2011-11-14 | Disposition: A | Discharge status: Home Health | Payer: 59 | Source: Home / Self Care | Attending: Family Medicine | Admitting: Family Medicine

## 2011-11-14 ENCOUNTER — Emergency Department (INDEPENDENT_AMBULATORY_CARE_PROVIDER_SITE_OTHER): Payer: 59

## 2011-11-14 DIAGNOSIS — S93609A Unspecified sprain of unspecified foot, initial encounter: Secondary | ICD-10-CM

## 2011-11-14 DIAGNOSIS — S93529A Sprain of metatarsophalangeal joint of unspecified toe(s), initial encounter: Secondary | ICD-10-CM

## 2011-11-14 DIAGNOSIS — S93601A Unspecified sprain of right foot, initial encounter: Secondary | ICD-10-CM

## 2011-11-14 MED ORDER — DICLOFENAC POTASSIUM 50 MG PO TABS: 50.0000 mg | ORAL_TABLET | Freq: Three times a day (TID) | ORAL | Status: DC

## 2011-11-14 NOTE — ED Provider Notes (Signed)
History     CSN: [id number]  Arrival date & time 11/14/11  1131   First MD Initiated Contact with Patient 11/14/11 1204      Chief Complaint  Patient presents with  . Foot Injury    (Consider location/radiation/quality/duration/timing/severity/associated sxs/prior treatment) Patient is a 36 y.o. male presenting with foot injury. The history is provided by the patient.  Foot Injury  The incident occurred 2 days ago. The incident occurred at home (during night tripped after getting off of commode.). The injury mechanism was a fall. The pain is present in the right foot. The quality of the pain is described as throbbing. The pain is mild. The pain has been constant since onset. He reports no foreign bodies present.    Past Medical History  Diagnosis Date  . Obstructive sleep apnea (adult) (pediatric)   . Anxiety and depression   . Essential hypertension, benign   . Obesity, unspecified   . Herpes zoster infection 07/2010, 12/2010    with post-herpetic neuralgia  . Vitamin d deficiency   . Crohn's disease   . Hyperlipidemia   . CMV (cytomegalovirus infection) 10/28/2010  . Nephrolithiasis   . Fistula, perirectal     history of  . Sinus infection 08/05/11    Past Surgical History  Procedure Date  . Small intestine surgery     1 ft removed, ileo-cecectomy  . Hypospadias correction     Childhood   . Upper gastrointestinal endoscopy 10/15/2010    w/biopsy, mild gastritis and duodenitis  . Colonoscopy 10/14/2007    crohn's colitis, aphthous ulcers, mild anal stenosis    Family History  Problem Relation Age of Onset  . Heart disease Father     CAD/MI  . Hyperlipidemia Father   . Hypertension Father   . Gout Father   . Sleep apnea Father   . Colon cancer Neg Hx   . Diabetes Neg Hx   . COPD Neg Hx   . Obesity Mother   . Allergies Mother   . Cancer Mother   . Stroke Maternal Grandfather   . Allergies Sister   . Asthma Sister     History  Substance Use Topics  .  Smoking status: Never Smoker   . Smokeless tobacco: Never Used  . Alcohol Use: No      Review of Systems  Constitutional: Negative.   Musculoskeletal: Positive for gait problem.    Allergies  Review of patient's allergies indicates no known allergies.  Home Medications   Current Outpatient Rx  Name Route Sig Dispense Refill  . ACETAMINOPHEN 325 MG PO TABS Oral Take 650 mg by mouth as needed. PAIN    . AMITRIPTYLINE HCL 25 MG PO TABS  start with 1 tab at night for 5 days, then increase to 2 tabs at night from then on. 60 tablet 3  . AMLODIPINE-OLMESARTAN 5-40 MG PO TABS Oral Take 1 tablet by mouth daily. 70 tablet 0  . DICLOFENAC POTASSIUM 50 MG PO TABS Oral Take 1 tablet (50 mg total) by mouth 3 (three) times daily. For pain as needed,. 15 tablet 0  . METHADONE HCL 5 MG PO TABS Oral Take 2 tablets (10 mg total) by mouth 3 (three) times daily. 180 tablet 0  . OXYCODONE-ACETAMINOPHEN 10-325 MG PO TABS Oral Take 1 tablet by mouth every 6 (six) hours as needed for pain. 60 tablet 0  . PAROXETINE HCL 40 MG PO TABS  TAKE 1 TABLET DAILY 90 tablet 1    BP  108/66  Pulse 88  Temp(Src) 99 F (37.2 C) (Oral)  Resp 17  SpO2 97%  Physical Exam  Nursing note and vitals reviewed. Constitutional: He appears well-developed and well-nourished.  Musculoskeletal: He exhibits edema and tenderness.       Feet:    ED Course  Procedures (including critical care time)  Labs Reviewed - No data to display Dg Foot Complete Right  11/14/2011  *RADIOLOGY REPORT*  Clinical Data: Right foot injury with a MTP joint pain.  Bruising.  RIGHT FOOT COMPLETE - 3+ VIEW  Comparison: 09/27/2007 ankle radiographs  Findings: Abnormal soft tissue swelling noted along the dorsum of the foot.  Alignment at the Lisfranc joint appears normal.  No fracture is observed.  A small os peroneus is present.  The sesamoids appear intact.  Plantar calcaneal spur noted.  The tibiotalar joint is indistinct on the lateral  projection although I suspect that this is orientation related.  IMPRESSION:  1.  Plantar calcaneal spur 2.  Dorsal soft tissue swelling. 3.  No fracture observed.  Original Report Authenticated By: Carron Curie, M.D.     1. Sprain of foot joint, right, initial encounter       MDM  X-rays reviewed and report per radiologist.         Billy Fischer, MD 11/14/11 1409

## 2011-11-14 NOTE — ED Notes (Signed)
Pt tripped on Thursday pm while attempting to stand from sitting on commode.  Pain and swelling of top of rt foot and bruising of rt 4th and 5th toes.

## 2011-11-14 NOTE — Discharge Instructions (Signed)
Warm soak 15 minutes twice a day, use medicine and boot for comfort as needed, activity as tolerated.

## 2011-11-19 ENCOUNTER — Encounter: Payer: Self-pay | Admitting: Infectious Disease

## 2011-11-19 ENCOUNTER — Ambulatory Visit (INDEPENDENT_AMBULATORY_CARE_PROVIDER_SITE_OTHER): Payer: 59 | Admitting: Infectious Disease

## 2011-11-19 VITALS — BP 113/78 | HR 78 | Temp 97.6°F | Ht 68.0 in | Wt 280.0 lb

## 2011-11-19 DIAGNOSIS — Z2089 Contact with and (suspected) exposure to other communicable diseases: Secondary | ICD-10-CM | POA: Insufficient documentation

## 2011-11-19 DIAGNOSIS — B259 Cytomegaloviral disease, unspecified: Secondary | ICD-10-CM

## 2011-11-19 DIAGNOSIS — B029 Zoster without complications: Secondary | ICD-10-CM

## 2011-11-19 DIAGNOSIS — R51 Headache: Secondary | ICD-10-CM

## 2011-11-19 NOTE — Progress Notes (Signed)
Subjective:    Patient ID: [name] [name], male    DOB: Jan 17, 1976, 36 y.o.   MRN: [id number]  HPI  [name] [name] is an 36 y.o. male who I met at [facility] in April of 2012 when he had FUO and diarrheal illness. During that admission he was found to be CMV viremic and was treated with clinical improvement with valcyte. He also had a lung nodule that we worked up with serologies at that time and PET scan and EUS biopsy had been considered. He has had problems with VZV zoster and postherpetic neuralgia and I saw pt in [place] [facility] as well with question of whether pt could get zostavax vaccine. He has been followed closely by Dr. [name] in Neurology for his postherpetic Neurology. Over the past Month  Prior to admission in April of 2013 he had progression of a severe headache on top of his head that increases in severity as he gets up to use the bathroom and is accompanied by severe rushing sound in his ears. Pain becomes so severe that he breaks out in profuse drenching sweats. He had not had much in way of overt fevers but may have had some weight loss. He had developed worsening diplopia with lateral gaze. Dr. [name] was concerned about a possible chronic meningitis given pts constellation of symptoms and his high risk for pathogens such as TB and dimorphic fungi given his TNF alpha therapy and his recent bouts with CMV and VZV infection. Large volume LP showed only 1 to 2 wbc and unremarkable CSF protein and glucose. MRI PRE LP was also normal. Approximately 58m of CSF was removed to facilitate requisite 8+ml of CSF centrifuged for sediment for AFB and separate 812mCSF centrifuged for sediment for fungal cultures. FUngal cultures were negative FINAl at 4 weeks and AFB are negative to date. Reviewed these culture data, as well as additional CSF tests were performed that included did a CSF cytomegalovirus PCR that was negative herpes simplex type I and 2 varicella-zoster virus PCR that were all  negative. He also had a CSF VDRL that was negative. He had varicella-zoster antibodies performed on spinal fluid which were negative. He is a cryptococcal antigen on spinal fluid which was negative. We also reviewed lab tests from April 2012 when he had a fever of unknown origin workup. 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 He has lived in [place] the past. He has never had a documented coccidioidal infection. He has no granulomatous disease on chest x-ray. He has received therapy with anti-tumor necrosis factor antibody infusions for several years without any evidence for recurrence of a fungal infection such as that with Coccidioides inmitus. Currently he is doing very well having received corticosteroid therapy for his headaches in the hospital he is no longer having more headaches. He has no fevers nausea chills or malaise.   Review of Systems  Constitutional: Negative for fever, chills, diaphoresis, activity change, appetite change, fatigue and unexpected weight change.  HENT: Negative for congestion, sore throat, rhinorrhea, sneezing, trouble swallowing and sinus pressure.   Eyes: Negative for photophobia and visual disturbance.  Respiratory: Negative for cough, chest tightness, shortness of breath, wheezing and stridor.   Cardiovascular: Negative for chest pain, palpitations and leg swelling.  Gastrointestinal: Negative for nausea, vomiting, abdominal pain, diarrhea, constipation, blood in stool, abdominal distention and anal bleeding.  Genitourinary: Negative for dysuria, hematuria, flank pain and difficulty urinating.  Musculoskeletal: Negative for myalgias, back pain, joint swelling, arthralgias and gait problem.  Skin: Negative for color change, pallor, rash and wound.  Neurological: Negative for dizziness, tremors, weakness and light-headedness.  Hematological: Negative for adenopathy. Does not bruise/bleed easily.    Psychiatric/Behavioral: Negative for behavioral problems, confusion, sleep disturbance, dysphoric mood, decreased concentration and agitation.       Objective:   Physical Exam  Constitutional: He is oriented to person, place, and time. He appears well-developed and well-nourished. No distress.  HENT:  Head: Normocephalic and atraumatic.  Mouth/Throat: Oropharynx is clear and moist. No oropharyngeal exudate.  Eyes: Conjunctivae and EOM are normal. Pupils are equal, round, and reactive to light. No scleral icterus.  Neck: Normal range of motion. Neck supple. No JVD present.  Cardiovascular: Normal rate, regular rhythm and normal heart sounds.  Exam reveals no gallop and no friction rub.   No murmur heard. Pulmonary/Chest: Effort normal and breath sounds normal. No respiratory distress. He has no wheezes. He has no rales. He exhibits no tenderness.  Abdominal: He exhibits no distension and no mass. There is no tenderness. There is no rebound and no guarding.  Musculoskeletal: He exhibits no edema and no tenderness.  Lymphadenopathy:    He has no cervical adenopathy.  Neurological: He is alert and oriented to person, place, and time. He has normal reflexes. He exhibits normal muscle tone. Coordination normal.  Skin: Skin is warm and dry. He is not diaphoretic. No erythema. No pallor.  Psychiatric: He has a normal mood and affect. His behavior is normal. Judgment and thought content normal.          Assessment & Plan:  Headache This was not due to meningitis or encephalitis. It resolved with corpus her in therapy. There's no need for further workup at this time.  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 Given that Mr. Mehlhoff has undergone several years of therapy with tumor necrosis after alpha antagonist therapy without evidence of her current fungal infection I do not feel  that the positive antibodies should push [place] to prophylax him against Coccidioides infection with fluconazole. We did consider checking Coccidioides antibodies by immunodiffusion but I don't know this ago and at change the risk-benefit equation for [place]. The patient is fine with this and we will simply continue to observe. Certainly should he develop a severe infection this positive antibody test should inform concern for possible reactivation of Coccidioides. PMI test certainly also could be cross reactive with some other fungal species, or be a false positive test.  CMV (cytomegalovirus infection) No evidence of recurrence  Herpes zoster infection He has been safely vaccinated with varicella-zoster vaccine to attenuate risk for further recurrence

## 2011-11-19 NOTE — Assessment & Plan Note (Signed)
This was not due to meningitis or encephalitis. It resolved with corpus her in therapy. There's no need for further workup at this time.

## 2011-11-19 NOTE — Assessment & Plan Note (Signed)
His, and fixing antibody that is positive indicates possible exposure to coccidioides, possibly while living in [place]. Given that [name] has undergone several years of therapy with tumor necrosis after alpha antagonist therapy without evidence of her current fungal infection I do not feel that the positive antibodies should push [place] to prophylax him against Coccidioides infection with fluconazole. We did consider checking Coccidioides antibodies by immunodiffusion but I don't know this ago and at change the risk-benefit equation for [place]. The patient is fine with this and we will simply continue to observe. Certainly should he develop a severe infection this positive antibody test should inform concern for possible reactivation of Coccidioides. PMI test certainly also could be cross reactive with some other fungal species, or be a false positive test.

## 2011-11-19 NOTE — Assessment & Plan Note (Signed)
No evidence of recurrence

## 2011-11-19 NOTE — Assessment & Plan Note (Signed)
He has been safely vaccinated with varicella-zoster vaccine to attenuate risk for further recurrence

## 2011-11-24 ENCOUNTER — Other Ambulatory Visit: Payer: Self-pay

## 2011-11-24 ENCOUNTER — Telehealth: Payer: Self-pay | Admitting: *Deleted

## 2011-11-24 DIAGNOSIS — K5090 Crohn's disease, unspecified, without complications: Secondary | ICD-10-CM

## 2011-11-24 MED ORDER — INFLIXIMAB 100 MG IV SOLR: INTRAVENOUS | Status: DC

## 2011-11-24 NOTE — Telephone Encounter (Signed)
Last remicade was given while he was inpatient on 10/13/11.  New orders entered

## 2011-11-24 NOTE — Telephone Encounter (Signed)
[facility] needs new orders for remicade today, patient is due tomorrow.

## 2011-11-25 ENCOUNTER — Encounter (HOSPITAL_COMMUNITY)
Admission: RE | Admit: 2011-11-25 | Discharge: 2011-11-25 | Disposition: A | Discharge status: Home / Self Care | Payer: 59 | Source: Ambulatory Visit | Attending: Internal Medicine | Admitting: Internal Medicine

## 2011-11-25 ENCOUNTER — Encounter (HOSPITAL_COMMUNITY): Payer: Self-pay

## 2011-11-25 VITALS — BP 107/71 | HR 65 | Temp 98.4°F | Resp 18 | Ht 68.0 in | Wt 222.0 lb

## 2011-11-25 DIAGNOSIS — K5090 Crohn's disease, unspecified, without complications: Secondary | ICD-10-CM

## 2011-11-25 MED ORDER — SODIUM CHLORIDE 0.9 % IV SOLN
5.0000 mg/kg | INTRAVENOUS | Status: DC
  Administered 2011-11-25: 500 mg via INTRAVENOUS
  Filled 2011-11-25: qty 50

## 2011-11-25 MED ORDER — ACETAMINOPHEN 325 MG PO TABS
650.0000 mg | ORAL_TABLET | ORAL | Status: DC
  Administered 2011-11-25: 650 mg via ORAL

## 2011-11-25 MED ORDER — DIPHENHYDRAMINE HCL 25 MG PO CAPS
50.0000 mg | ORAL_CAPSULE | ORAL | Status: DC
  Administered 2011-11-25: 50 mg via ORAL

## 2011-11-25 MED ORDER — SODIUM CHLORIDE 0.9 % IV SOLN
INTRAVENOUS | Status: DC
  Administered 2011-11-25: 15:00:00 via INTRAVENOUS

## 2011-11-25 NOTE — Discharge Instructions (Signed)
Infliximab injection What is this medicine? INFLIXIMAB (in Cocke i mab) is used to treat Crohn's disease and ulcerative colitis. It is also used to treat ankylosing spondylitis, psoriasis, and some forms of arthritis. This medicine may be used for other purposes; ask your health care provider or pharmacist if you have questions. What should I tell my health care provider before I take this medicine? They need to know if you have any of these conditions: -diabetes -exposure to tuberculosis -heart failure -hepatitis or liver disease -immune system problems -infection -lung or breathing disease, like COPD -multiple sclerosis -current or past resident of [place] or [place] -seizure disorder -an unusual or allergic reaction to infliximab, mouse proteins, other medicines, foods, dyes, or preservatives -pregnant or trying to get pregnant -breast-feeding How should I use this medicine? This medicine is for injection into a vein. It is usually given by a health care professional in a hospital or clinic setting. A special MedGuide will be given to you by the pharmacist with each prescription and refill. Be sure to read this information carefully each time. Talk to your pediatrician regarding the use of this medicine in children. Special care may be needed. Overdosage: If you think you have taken too much of this medicine contact a poison control center or emergency room at once. NOTE: This medicine is only for you. Do not share this medicine with others. What if I miss a dose? It is important not to miss your dose. Call your doctor or health care professional if you are unable to keep an appointment. What may interact with this medicine? Do not take this medicine with any of the following medications: -anakinra -rilonacept This medicine may also interact with the following medications: -vaccines This list may not describe all possible interactions. Give your health care provider  a list of all the medicines, herbs, non-prescription drugs, or dietary supplements you use. Also tell them if you smoke, drink alcohol, or use illegal drugs. Some items may interact with your medicine. What should I watch for while using this medicine? Visit your doctor or health care professional for regular checks on your progress. If you get a cold or other infection while receiving this medicine, call your doctor or health care professional. Do not treat yourself. This medicine may decrease your body's ability to fight infections. Before beginning therapy, your doctor may do a test to see if you have been exposed to tuberculosis. This medicine may make the symptoms of heart failure worse in some patients. If you notice symptoms such as increased shortness of breath or swelling of the ankles or legs, contact your health care provider right away. If you are going to have surgery or dental work, tell your health care professional or dentist that you have received this medicine. If you take this medicine for plaque psoriasis, stay out of the sun. If you cannot avoid being in the sun, wear protective clothing and use sunscreen. Do not use sun lamps or tanning beds/booths. What side effects may I notice from receiving this medicine? Side effects that you should report to your doctor or health care professional as soon as possible: -allergic reactions like skin rash, itching or hives, swelling of the face, lips, or tongue -chest pain -fever or chills, usually related to the infusion -muscle or joint pain -red, scaly patches or raised bumps on the skin -signs of infection - fever or chills, cough, sore throat, pain or difficulty passing urine -swollen lymph nodes in the neck, underarm,  or groin areas -unexplained weight loss -unusual bleeding or bruising -unusually weak or tired -yellowing of the eyes or skin Side effects that usually do not require medical attention (report to your doctor or health  care professional if they continue or are bothersome): -headache -heartburn or stomach pain -nausea, vomiting This list may not describe all possible side effects. Call your doctor for medical advice about side effects. You may report side effects to FDA at 1-800-FDA-1088. Where should I keep my medicine? This drug is given in a hospital or clinic and will not be stored at home. NOTE: This sheet is a summary. It may not cover all possible information. If you have questions about this medicine, talk to your doctor, pharmacist, or health care provider.  2012, Elsevier/Gold Standard. (01/25/2008 10:26:02 AM)

## 2011-11-26 LAB — AFB CULTURE WITH SMEAR (NOT AT ARMC)

## 2011-11-30 ENCOUNTER — Encounter: Payer: Self-pay | Admitting: Endocrinology

## 2011-11-30 ENCOUNTER — Ambulatory Visit (INDEPENDENT_AMBULATORY_CARE_PROVIDER_SITE_OTHER): Payer: 59 | Admitting: Endocrinology

## 2011-11-30 VITALS — BP 108/62 | HR 122 | Temp 98.1°F | Ht 68.0 in | Wt 230.0 lb

## 2011-11-30 DIAGNOSIS — R51 Headache: Secondary | ICD-10-CM

## 2011-11-30 MED ORDER — PAROXETINE HCL 40 MG PO TABS: ORAL_TABLET | ORAL | Status: DC

## 2011-11-30 MED ORDER — OXYCODONE HCL 30 MG PO TABS: 30.0000 mg | ORAL_TABLET | Freq: Four times a day (QID) | ORAL | Status: AC | PRN

## 2011-11-30 NOTE — Patient Instructions (Addendum)
Here is a refill of the pain medication. Please see dr lewitt tomorrow as scheduled.

## 2011-11-30 NOTE — Progress Notes (Signed)
Subjective:    Patient ID: [name] [name], male    DOB: [birth year], 36 y.o.   MRN: [id number]  HPI Pt was hospitalized with headache 6 weeks ago.  It resolved with steroids.  He saw dr lewitt in f/u, but headache had resolved by then.  It has now recurred  4 days.  He describes it as severe, worst at the crown of the head--he has had assoc n/v.  He drove himself here today.   Past Medical History  Diagnosis Date  . Obstructive sleep apnea (adult) (pediatric)   . Anxiety and depression   . Essential hypertension, benign   . Obesity, unspecified   . Herpes zoster infection 07/2010, 12/2010    with post-herpetic neuralgia  . Vitamin d deficiency   . Crohn's disease   . Hyperlipidemia   . CMV (cytomegalovirus infection) 10/28/2010  . Nephrolithiasis   . Fistula, perirectal     history of  . Sinus infection 08/05/11    Past Surgical History  Procedure Date  . Small intestine surgery     1 ft removed, ileo-cecectomy  . Hypospadias correction     Childhood   . Upper gastrointestinal endoscopy 10/15/2010    w/biopsy, mild gastritis and duodenitis  . Colonoscopy 10/14/2007    crohn's colitis, aphthous ulcers, mild anal stenosis    History   Social History  . Marital Status: Legally Separated    Spouse Name: N/A    Number of Children: 2  . 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 Smoking status: Never Smoker   . Smokeless tobacco: Never Used  . Alcohol Use: No  . Drug Use: No  . Sexually Active: Not Currently -- Male partner(s)   Other Topics Concern  . Not on file   Social History Narrative   HSG, [employer] - 2 years. Married '99 - 5yr/divorced. 2 sons - '98, '00 split custody. 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 No history of physical or sexual abuse.     Current Outpatient Prescriptions on File Prior to Visit  Medication Sig Dispense Refill  . acetaminophen  (TYLENOL) 325 MG tablet Take 650 mg by mouth as needed. PAIN      . amitriptyline (ELAVIL) 25 MG tablet start with 1 tab at night for 5 days, then increase to 2 tabs at night from then on.  60 tablet  3  . amLODipine-olmesartan (AZOR) 5-40 MG per tablet Take 1 tablet by mouth daily.  70 tablet  0  . inFLIXimab (REMICADE) 100 MG injection Remicade 5 mg/kg IV q 6 weeks  1 each  3  . methadone (DOLOPHINE) 5 MG tablet Take 2 tablets (10 mg total) by mouth 3 (three) times daily.  180 tablet  0  . PARoxetine (PAXIL) 40 MG tablet TAKE 1 TABLET DAILY  30 tablet  0  . diclofenac (CATAFLAM) 50 MG tablet Take 1 tablet (50 mg total) by mouth 3 (three) times daily. For pain as needed,.  15 tablet  0    No Known Allergies  Family History  Problem Relation Age of Onset  . Heart disease Father     CAD/MI  . Hyperlipidemia Father   . Hypertension Father   . Gout Father   . Sleep apnea Father   . Colon cancer Neg Hx   . Diabetes Neg Hx   . COPD Neg Hx   .  Obesity Mother   . Allergies Mother   . Cancer Mother   . Stroke Maternal Grandfather   . Allergies Sister   . Asthma Sister     BP 108/62  Pulse 122  Temp 98.1 F (36.7 C) (Oral)  Ht 5' 8"  (1.727 m)  Wt 230 lb (104.327 kg)  BMI 34.97 kg/m2  SpO2 94%   Review of Systems Denies LOC and visual loss.      Objective:   Physical Exam VITAL SIGNS:  See vs page GENERAL: no distress head: no deformity eyes: no periorbital swelling, no proptosis external nose and ears are normal mouth: no lesion seen Both eac's and tm's are normal Neck: supple CN 2-12 intact     Assessment & Plan:  Headache, recurrent

## 2011-12-09 ENCOUNTER — Telehealth: Payer: Self-pay | Admitting: Neurology

## 2011-12-09 ENCOUNTER — Telehealth: Payer: Self-pay | Admitting: Internal Medicine

## 2011-12-09 NOTE — Telephone Encounter (Signed)
For the management of complex headache and problem with meds prescribed by Dr. Catalina Gravel - he should contact or see Dr. Catalina Gravel. I have a very high degree of confidence in Dr. Catalina Gravel.

## 2011-12-09 NOTE — Telephone Encounter (Signed)
Pt was referred to the [facility] for headaches to Dr. Catalina Gravel.  He was put on depakote 250 mg twice a day.  It was increased to 500 mg twice a day starting yesterday.  He is sleepy and disoriented.  He still has the headache.  Should he come here or back to Dr. Catalina Gravel?  When could he be worked in?

## 2011-12-09 NOTE — Telephone Encounter (Signed)
Pt saw Dr. Melton Alar after hospitalization and was put on Depakote. 1 BID, then increased yesterday to 2 BID. Experiencing negative symptoms such as grogginess and inability to organize thoughts. He's been taking the medication for a little over a week. Pt wanted to alert this office to find out if he should be on this rx and if these symptoms are normal.

## 2011-12-09 NOTE — Telephone Encounter (Signed)
Left a voice message for the patient stating he should contact Dr. Ovid Curd office for advice re: side effects of the Depakote. Let him know that Dr. [name] was out of the office until tomorrow am.

## 2011-12-14 NOTE — Telephone Encounter (Signed)
Pt followed up with Dr. Catalina Gravel.

## 2011-12-18 ENCOUNTER — Telehealth: Payer: Self-pay | Admitting: Neurology

## 2011-12-18 MED ORDER — METHADONE HCL 5 MG PO TABS: 10.0000 mg | ORAL_TABLET | Freq: Three times a day (TID) | ORAL | Status: DC

## 2011-12-18 NOTE — Telephone Encounter (Signed)
Left a vm message at the work number that his prescription was ready for pick up.

## 2011-12-18 NOTE — Telephone Encounter (Signed)
Pt would like refills on methadone. Headache wellness doctor prescribed Depakote, which is causing unusual dreams. He is also sleeping for longer periods of time than usual. Pt reports that he's has spoken to headache wellness who told him to "give it some time" but he would still like Dr. Jodi Mourning opinion. Pt has been taking Depakote since 12/01/2011.

## 2011-12-21 ENCOUNTER — Other Ambulatory Visit: Payer: Self-pay | Admitting: Neurology

## 2011-12-21 ENCOUNTER — Ambulatory Visit: Payer: 59 | Admitting: Neurology

## 2011-12-21 ENCOUNTER — Encounter: Payer: Self-pay | Admitting: Neurology

## 2011-12-21 ENCOUNTER — Ambulatory Visit (INDEPENDENT_AMBULATORY_CARE_PROVIDER_SITE_OTHER): Payer: 59 | Admitting: Neurology

## 2011-12-21 VITALS — BP 108/70 | HR 120 | Ht 68.0 in | Wt 229.0 lb

## 2011-12-21 DIAGNOSIS — G43009 Migraine without aura, not intractable, without status migrainosus: Secondary | ICD-10-CM

## 2011-12-21 DIAGNOSIS — B0229 Other postherpetic nervous system involvement: Secondary | ICD-10-CM

## 2011-12-21 MED ORDER — OXYCODONE-ACETAMINOPHEN 10-325 MG PO TABS: 2.0000 | ORAL_TABLET | Freq: Every day | ORAL | Status: DC

## 2011-12-21 MED ORDER — PROCHLORPERAZINE MALEATE 5 MG PO TABS: 5.0000 mg | ORAL_TABLET | Freq: Four times a day (QID) | ORAL | Status: DC | PRN

## 2011-12-21 MED ORDER — RIZATRIPTAN BENZOATE 10 MG PO TBDP: 10.0000 mg | ORAL_TABLET | ORAL | Status: DC | PRN

## 2011-12-21 MED ORDER — METHADONE HCL 5 MG PO TABS: 10.0000 mg | ORAL_TABLET | Freq: Three times a day (TID) | ORAL | Status: DC

## 2011-12-21 NOTE — Telephone Encounter (Signed)
Patient called and spoke with Lattie Haw in scheduling. Made an appointment to be seen today.

## 2011-12-21 NOTE — Telephone Encounter (Signed)
Left a vm message that prescription read for pick up--called his home #.

## 2011-12-21 NOTE — Progress Notes (Signed)
Dear Dr. Linda Hedges,  I saw  [name] [name] back in [facility] for his problem with presumed migraine headaches as well as post-herpetic neuralgia.  As you may recall, he is a 36 y.o. year old male with a history of Chrohn's disease on Remicade who I have managed for post-herpetic pain in his left thoracic area.  He has tried on gabapentin 2754m per day and then added Lyrica 4538ma day.  He did not respond to this and was taking Percocet every six hours for the pain.  I started him on methadone and have increased him to 1074mid.  He takes this in conjunction with 2 10/325m7mlls of Percocet at night and feels his pain is well treated.  I also have been involved in treatment of new headaches, that are accompanied by a pounding sensation at the vertex and photophobia with nausea.  He had a very prolonged headache initially in the April time frame after a norovirus infection.  I was worried at that time about chronic meningitis given his immunomodulated state.  However, MRI brain and CSF analysis were normal.  His headache responded to a decadron taper.  At that same time I had started him on Elavil, despite only really having one headache event.  He had another 4 day headache similar in quality that now sound like prolonged migraine. There was prominent nausea and photophobia. He has seen Dr. ElioSharman Crate started him on Depakote 500 bid, but this has made him feel unwell so he has stopped this.  He has not had any further headaches.  He is tolerating 50mg41mvil qhs.  Medical history, social history, and family history were reviewed and have not changed since the last clinic visit.  Current Outpatient Prescriptions on File Prior to Visit  Medication Sig Dispense Refill  . acetaminophen (TYLENOL) 325 MG tablet Take 650 mg by mouth as needed. PAIN      . amitriptyline (ELAVIL) 25 MG tablet start with 1 tab at night for 5 days, then increase to 2 tabs at night from then on.  60 tablet  3    . amLODipine-olmesartan (AZOR) 5-40 MG per tablet Take 1 tablet by mouth daily.  70 tablet  0  . divalproex (DEPAKOTE) 250 MG DR tablet Take 250 mg by mouth. Two tabs twice a day      . inFLIXimab (REMICADE) 100 MG injection Remicade 5 mg/kg IV q 6 weeks  1 each  3  . PARoxetine (PAXIL) 40 MG tablet TAKE 1 TABLET DAILY  30 tablet  0  . DISCONTD: methadone (DOLOPHINE) 5 MG tablet Take 2 tablets (10 mg total) by mouth 3 (three) times daily.  180 tablet  0  . diclofenac (CATAFLAM) 50 MG tablet Take 1 tablet (50 mg total) by mouth 3 (three) times daily. For pain as needed,.  15 tablet  0  . prochlorperazine (COMPAZINE) 5 MG tablet Take 1 tablet (5 mg total) by mouth every 6 (six) hours as needed (nausea and headache).  30 tablet  2  . rizatriptan (MAXALT-MLT) 10 MG disintegrating tablet Take 1 tablet (10 mg total) by mouth as needed for migraine. May repeat in 2 hours if needed  10 tablet  11    No Known Allergies  ROS:  13 systems were reviewed and  are unremarkable.  Exam: . Filed Vitals:   12/21/11 1023  BP: 108/70  Pulse: 120  Height: 5' 8"  (1.727 m)  Weight: 229 lb (103.874 kg)  Impression/Recommendations:  1.  Post-herpetic neuralgia - I have refilled his methadone and percocet again today.  I have suggested that he see Dr. Hardin Negus in pain mgmt about possibly trying the Qutenza patch to see if he could taper his methadone.  As I will be leaving practicing neurology in [place] it would also be a more suitable place for him to follow for his PHN. 2.  Migraine headaches - very infrequent.  I am not going to change his preventatives at this time but may increase his Elavil in the future if they recur.  I have given him Maxalt 74m prn and Compazine 524mprn as abortives if the headaches return.  We will see the patient back in 3 months.  MaKavin LeechoJacelyn GripMD [facility], [place]

## 2011-12-21 NOTE — Telephone Encounter (Signed)
Left a message at his work number that prescription is ready for pick up.

## 2012-01-01 ENCOUNTER — Encounter: Payer: Self-pay | Admitting: Internal Medicine

## 2012-01-01 ENCOUNTER — Ambulatory Visit (INDEPENDENT_AMBULATORY_CARE_PROVIDER_SITE_OTHER): Payer: 59 | Admitting: Internal Medicine

## 2012-01-01 VITALS — BP 128/86 | HR 84 | Temp 97.1°F | Resp 16

## 2012-01-01 DIAGNOSIS — F329 Major depressive disorder, single episode, unspecified: Secondary | ICD-10-CM

## 2012-01-01 DIAGNOSIS — I10 Essential (primary) hypertension: Secondary | ICD-10-CM

## 2012-01-01 DIAGNOSIS — F3289 Other specified depressive episodes: Secondary | ICD-10-CM

## 2012-01-01 DIAGNOSIS — R51 Headache: Secondary | ICD-10-CM

## 2012-01-01 MED ORDER — PAROXETINE HCL 40 MG PO TABS: ORAL_TABLET | ORAL | Status: DC

## 2012-01-01 MED ORDER — AMLODIPINE-OLMESARTAN 5-40 MG PO TABS: 1.0000 | ORAL_TABLET | Freq: Every day | ORAL | Status: DC

## 2012-01-01 MED ORDER — OXYCODONE-ACETAMINOPHEN 10-325 MG PO TABS: 2.0000 | ORAL_TABLET | Freq: Every day | ORAL | Status: DC

## 2012-01-01 NOTE — Patient Instructions (Signed)

## 2012-01-04 NOTE — Assessment & Plan Note (Signed)
His BP is well controlled 

## 2012-01-04 NOTE — Progress Notes (Signed)
Subjective:    Patient ID: [name] [name], male    DOB: [birth year], 36 y.o.   MRN: [id number]  Headache  This is a chronic problem. The current episode started more than 1 year ago. The problem occurs intermittently. The problem has been unchanged. The pain is located in the bilateral region. The pain does not radiate. The pain quality is similar to prior headaches. The quality of the pain is described as aching. The pain is at a severity of 2/10. The pain is mild. Associated symptoms include insomnia. Pertinent negatives include no abdominal pain, abnormal behavior, anorexia, back pain, blurred vision, coughing, dizziness, drainage, ear pain, eye pain, eye redness, eye watering, facial sweating, fever, hearing loss, loss of balance, muscle aches, nausea, neck pain, numbness, phonophobia, photophobia, rhinorrhea, scalp tenderness, seizures, sinus pressure, sore throat, swollen glands, tingling, tinnitus, visual change, vomiting, weakness or weight loss. He has tried oral narcotics and triptans for the symptoms. The treatment provided significant relief. His past medical history is significant for hypertension and migraine headaches.  Hypertension This is a chronic problem. The current episode started more than 1 year ago. The problem has been gradually improving since onset. The problem is controlled. Associated symptoms include headaches. Pertinent negatives include no anxiety, blurred vision, chest pain, malaise/fatigue, neck pain, orthopnea, palpitations, peripheral edema, PND, shortness of breath or sweats. Past treatments include angiotensin blockers and calcium channel blockers.      Review of Systems  Constitutional: Negative.  Negative for fever, weight loss and malaise/fatigue.  HENT: Negative for hearing loss, ear pain, sore throat, rhinorrhea, neck pain, sinus pressure and tinnitus.   Eyes: Negative.  Negative for blurred vision, photophobia, pain and redness.  Respiratory: Negative.   Negative for cough and shortness of breath.   Cardiovascular: Negative.  Negative for chest pain, palpitations, orthopnea and PND.  Gastrointestinal: Negative.  Negative for nausea, vomiting, abdominal pain and anorexia.  Genitourinary: Negative.   Musculoskeletal: Negative.  Negative for back pain.  Skin: Negative.   Neurological: Positive for headaches. Negative for dizziness, tingling, seizures, weakness, numbness and loss of balance.  Hematological: Negative.   Psychiatric/Behavioral: The patient has insomnia.        Objective:   Physical Exam  Vitals reviewed. Constitutional: He appears well-developed and well-nourished. No distress.  HENT:  Head: Normocephalic and atraumatic.  Mouth/Throat: Oropharynx is clear and moist. No oropharyngeal exudate.  Eyes: Conjunctivae are normal. Right eye exhibits no discharge. Left eye exhibits no discharge. No scleral icterus.  Neck: Normal range of motion. Neck supple. No JVD present. No tracheal deviation present. No thyromegaly present.  Cardiovascular: Normal rate, regular rhythm, normal heart sounds and intact distal pulses.  Exam reveals no gallop and no friction rub.   No murmur heard. Pulmonary/Chest: Effort normal and breath sounds normal. No stridor. No respiratory distress. He has no wheezes. He has no rales. He exhibits no tenderness.  Abdominal: Soft. Bowel sounds are normal. He exhibits no distension. There is no tenderness. There is no rebound.  Musculoskeletal: Normal range of motion. He exhibits no edema and no tenderness.  Lymphadenopathy:    He has no cervical adenopathy.  Neurological: He is alert. He has normal strength. He displays no atrophy, no tremor and normal reflexes. No cranial nerve deficit or sensory deficit. He exhibits normal muscle tone. He displays a negative Romberg sign. He displays no seizure activity. Coordination and gait normal. He displays no Babinski's sign on the right side. He displays no Babinski's sign  on the  left side.  Reflex Scores:      Tricep reflexes are 1+ on the right side and 1+ on the left side.      Bicep reflexes are 1+ on the right side and 1+ on the left side.      Brachioradialis reflexes are 1+ on the right side and 1+ on the left side.      Patellar reflexes are 1+ on the right side and 1+ on the left side.      Achilles reflexes are 1+ on the right side and 1+ on the left side. Skin: Skin is warm and dry. No rash noted. He is not diaphoretic. No erythema. No pallor.  Psychiatric: He has a normal mood and affect. His behavior is normal. Judgment and thought content normal.          Assessment & Plan:

## 2012-01-04 NOTE — Assessment & Plan Note (Signed)
He will continue his current meds for headache treatment

## 2012-01-06 ENCOUNTER — Encounter (HOSPITAL_COMMUNITY)
Admission: RE | Admit: 2012-01-06 | Discharge: 2012-01-06 | Disposition: A | Discharge status: Home / Self Care | Payer: 59 | Source: Ambulatory Visit | Attending: Internal Medicine | Admitting: Internal Medicine

## 2012-01-06 ENCOUNTER — Encounter (HOSPITAL_COMMUNITY): Payer: Self-pay

## 2012-01-06 VITALS — BP 132/76 | HR 84 | Temp 98.3°F | Resp 14 | Wt 236.2 lb

## 2012-01-06 DIAGNOSIS — K5090 Crohn's disease, unspecified, without complications: Secondary | ICD-10-CM | POA: Insufficient documentation

## 2012-01-06 MED ORDER — SODIUM CHLORIDE 0.9 % IV SOLN
INTRAVENOUS | Status: DC
  Administered 2012-01-06: 16:00:00 via INTRAVENOUS

## 2012-01-06 MED ORDER — DIPHENHYDRAMINE HCL 25 MG PO CAPS
50.0000 mg | ORAL_CAPSULE | ORAL | Status: DC
  Administered 2012-01-06: 50 mg via ORAL

## 2012-01-06 MED ORDER — ACETAMINOPHEN 325 MG PO TABS
ORAL_TABLET | ORAL | Status: AC
  Administered 2012-01-06: 650 mg via ORAL
  Filled 2012-01-06: qty 2

## 2012-01-06 MED ORDER — ACETAMINOPHEN 325 MG PO TABS
650.0000 mg | ORAL_TABLET | ORAL | Status: DC
  Administered 2012-01-06: 650 mg via ORAL

## 2012-01-06 MED ORDER — SODIUM CHLORIDE 0.9 % IV SOLN
5.0000 mg/kg | INTRAVENOUS | Status: DC
  Administered 2012-01-06: 500 mg via INTRAVENOUS
  Filled 2012-01-06: qty 50

## 2012-01-06 MED ORDER — DIPHENHYDRAMINE HCL 25 MG PO CAPS
ORAL_CAPSULE | ORAL | Status: AC
  Administered 2012-01-06: 50 mg via ORAL
  Filled 2012-01-06: qty 2

## 2012-01-26 ENCOUNTER — Telehealth: Payer: Self-pay | Admitting: Neurology

## 2012-01-26 MED ORDER — AMITRIPTYLINE HCL 25 MG PO TABS: ORAL_TABLET | ORAL | Status: DC

## 2012-01-26 MED ORDER — METHADONE HCL 5 MG PO TABS: 10.0000 mg | ORAL_TABLET | Freq: Three times a day (TID) | ORAL | Status: DC

## 2012-01-26 NOTE — Telephone Encounter (Signed)
Prescription is ready for pick up (Methadone). Elavil sent via e-scribe. Pt is aware.

## 2012-01-26 NOTE — Telephone Encounter (Signed)
Pt needs written rx refill on Methadone. He is completely out of the medication. Would also like a refill on Elavil. Please call patient once this is ready for pick up.

## 2012-02-11 ENCOUNTER — Telehealth: Payer: Self-pay | Admitting: Neurology

## 2012-02-11 ENCOUNTER — Telehealth: Payer: Self-pay | Admitting: Internal Medicine

## 2012-02-11 NOTE — Telephone Encounter (Signed)
Left a message for the patient to return my call.  

## 2012-02-11 NOTE — Telephone Encounter (Signed)
He should take away 41m a day every two weeks.  for example  03/26/09 the first week, 10/24/08 the second week, 5/5/5 the third week, 5/0/5 the fourth week, 0/0/5 the fifth week, 0/0/0 the sixth week.

## 2012-02-11 NOTE — Telephone Encounter (Signed)
[name] [name] returned my call. He would like to find out from Dr. [name] how to wean off of the Methadone as he has lost his job as well as his insurance as of July 19 th. He was never seen at [facility] due to the job loss. He reports that the Methadone isn't all that expensive but that he is having to watch his budget very carefully. I told him I would let Dr. [name] know his current situation and that we would be in touch. He is ok to wait.  **Dr. [name] please advise.

## 2012-02-11 NOTE — Telephone Encounter (Signed)
Pt would like to speak to nurse concerning his methadone rx.

## 2012-02-11 NOTE — Telephone Encounter (Signed)
Called and spoke with [name]. Instructions given as per Dr. [name] below re: weaning off of the Methadone. The patient will call us if he needs one last prescription. No other concerns voiced at this time.

## 2012-02-15 NOTE — Telephone Encounter (Signed)
Patient has lost his job and his insurance.  He is to get his next Remicade infusion on Wed.  He is provided the number to apply for the [facility] assistance program.  He is advised to go his Remicade infusion on Wed.

## 2012-02-17 ENCOUNTER — Encounter (HOSPITAL_COMMUNITY)
Admission: RE | Admit: 2012-02-17 | Discharge: 2012-02-17 | Disposition: A | Discharge status: Home / Self Care | Payer: 59 | Source: Ambulatory Visit | Attending: Internal Medicine | Admitting: Internal Medicine

## 2012-02-17 ENCOUNTER — Encounter (HOSPITAL_COMMUNITY): Payer: Self-pay

## 2012-02-17 VITALS — BP 116/75 | HR 81 | Temp 97.8°F | Resp 16 | Wt 238.4 lb

## 2012-02-17 DIAGNOSIS — K5090 Crohn's disease, unspecified, without complications: Secondary | ICD-10-CM | POA: Insufficient documentation

## 2012-02-17 MED ORDER — ACETAMINOPHEN 325 MG PO TABS
650.0000 mg | ORAL_TABLET | ORAL | Status: DC
  Administered 2012-02-17: 650 mg via ORAL
  Filled 2012-02-17: qty 2

## 2012-02-17 MED ORDER — SODIUM CHLORIDE 0.9 % IV SOLN
INTRAVENOUS | Status: DC
  Administered 2012-02-17: 15:00:00 via INTRAVENOUS

## 2012-02-17 MED ORDER — SODIUM CHLORIDE 0.9 % IV SOLN
5.0000 mg/kg | INTRAVENOUS | Status: AC
  Administered 2012-02-17: 500 mg via INTRAVENOUS
  Filled 2012-02-17: qty 50

## 2012-02-17 MED ORDER — DIPHENHYDRAMINE HCL 25 MG PO CAPS
50.0000 mg | ORAL_CAPSULE | ORAL | Status: DC
  Administered 2012-02-17: 50 mg via ORAL
  Filled 2012-02-17: qty 2

## 2012-03-01 ENCOUNTER — Ambulatory Visit: Payer: 59 | Admitting: Neurology

## 2012-03-02 ENCOUNTER — Telehealth: Payer: Self-pay | Admitting: Internal Medicine

## 2012-03-02 DIAGNOSIS — K5090 Crohn's disease, unspecified, without complications: Secondary | ICD-10-CM

## 2012-03-02 NOTE — Telephone Encounter (Signed)
Left message for patient to call back  

## 2012-03-02 NOTE — Telephone Encounter (Signed)
Patient c/o 6-7 BM a day for the last few weeks with epigastric pain. He feels extreme bloating  He denies fever, rectal bleeding, nausea or vomiting.  He does not have another Remicade until 03/30/12.  Dr. Carlean Purl please advise

## 2012-03-03 NOTE — Telephone Encounter (Signed)
He needs an appointment 1-2 weeks  Ask him how much diarrhea - any recent trips or antibiotics?

## 2012-03-04 NOTE — Telephone Encounter (Signed)
Trip to [place] last weekend, but no antibiotics.  REV scheduled for the 24th

## 2012-03-04 NOTE — Telephone Encounter (Signed)
CBC, BMET and CRP before I see him please

## 2012-03-04 NOTE — Telephone Encounter (Signed)
Patient aware.

## 2012-03-04 NOTE — Telephone Encounter (Signed)
Left message for patient to call back  

## 2012-03-11 ENCOUNTER — Other Ambulatory Visit (INDEPENDENT_AMBULATORY_CARE_PROVIDER_SITE_OTHER): Payer: 59

## 2012-03-11 DIAGNOSIS — K5090 Crohn's disease, unspecified, without complications: Secondary | ICD-10-CM

## 2012-03-11 LAB — BASIC METABOLIC PANEL
BUN: 11 mg/dL (ref 6–23)
Chloride: 103 mEq/L (ref 96–112)
GFR: 115.6 mL/min (ref >60.00)
Potassium: 3.8 mEq/L (ref 3.5–5.1)
Sodium: 136 mEq/L (ref 135–145)

## 2012-03-11 LAB — CBC WITH DIFFERENTIAL/PLATELET
Basophils Absolute: 0 10*3/uL (ref 0.0–0.1)
Eosinophils Absolute: 0.1 10*3/uL (ref 0.0–0.7)
Hemoglobin: 14.8 g/dL (ref 13.0–17.0)
Lymphocytes Relative: 43.9 % (ref 12.0–46.0)
MCHC: 33.7 g/dL (ref 30.0–36.0)
Monocytes Relative: 6.6 % (ref 3.0–12.0)
Neutro Abs: 4.1 10*3/uL (ref 1.4–7.7)
Neutrophils Relative %: 48.4 % (ref 43.0–77.0)
RDW: 13.8 % (ref 11.5–14.6)

## 2012-03-11 LAB — C-REACTIVE PROTEIN: CRP: 0.5 mg/dL (ref 0.5–20.0)

## 2012-03-15 ENCOUNTER — Ambulatory Visit (INDEPENDENT_AMBULATORY_CARE_PROVIDER_SITE_OTHER): Payer: Self-pay | Admitting: Internal Medicine

## 2012-03-15 ENCOUNTER — Encounter: Payer: Self-pay | Admitting: Internal Medicine

## 2012-03-15 VITALS — BP 138/72 | HR 88 | Ht 69.0 in | Wt 239.0 lb

## 2012-03-15 DIAGNOSIS — K5080 Crohn's disease of both small and large intestine without complications: Secondary | ICD-10-CM

## 2012-03-15 DIAGNOSIS — R197 Diarrhea, unspecified: Secondary | ICD-10-CM

## 2012-03-15 NOTE — Progress Notes (Signed)
Subjective:    Patient ID: [name] [name], male    DOB: 1975/11/22, 36 y.o.   MRN: [id number]  HPI The patient returns for followup. She has Crohn's disease status post a right hemicolectomy. He has been maintained on Remicade, 5 mg per kilogram IV every 6 weeks with good results. In the spring, April of 2013 he had worsening blood pressure. Lisinopril was changed to a combination of amlodipine olmesartan. Sometime this summer he began to have diarrhea. He is having problems to where the Remicade does not seem to make a difference. He has fairly constant intermittent diarrhea bloating and gassy cramps. He is not describing bleeding. There's been no fever. His C-reactive protein last week was normal. He has not had recent antibiotics.  He was laid off in July, is pursuing assistance for his Remicade therapy through [employer] and is also trying to get into the reduced fee indigent [facility] program.  Wt Readings from Last 3 Encounters:  03/15/12 239 lb (108.41 kg)  02/17/12 238 lb 6 oz (108.126 kg)  01/06/12 236 lb 4 oz (107.162 kg)   Medications, allergies, past medical history, past surgical history, family history and social history are reviewed and updated in the EMR.  Review of Systems He continues to have insomnia problem    Objective:   Physical Exam Is obese and in no acute distress Well  developed well-nourished Abdomen is obese soft nontender no masses bowel sounds present somewhat increased  Lab Results  Component Value Date   WBC 8.6 03/11/2012   HGB 14.8 03/11/2012   HCT 44.0 03/11/2012   MCV 90.3 03/11/2012   PLT 222.0 03/11/2012      Chemistry      Component Value Date/Time   NA 136 03/11/2012 1054   K 3.8 03/11/2012 1054   CL 103 03/11/2012 1054   CO2 26 03/11/2012 1054   BUN 11 03/11/2012 1054   CREATININE 0.8 03/11/2012 1054      Component Value Date/Time   CALCIUM 9.1 03/11/2012 1054   ALKPHOS 79 10/13/2011 2142   AST 41* 10/13/2011 2142   ALT 46 10/13/2011 2142     BILITOT 0.2* 10/13/2011 2142      Lab Results  Component Value Date   CRP 0.5 03/11/2012         Assessment & Plan:   1. CROHN'S DISEASE, LARGE AND SMALL INTESTINES   2. Diarrhea    1. I wonder if he does not have an olmesartan-induced sprue like enteropathy which is being reported with increasing frequency. 2. We discussed upper endoscopy to diagnose this. Perhaps a simple her way would be to hold this medication and change blood pressure medications, and see if the diarrhea resolves as it should if that is the problem. We discussed this approach versus scoping and have decided to try stopping the medication. 3. Stool for fecal lactoferrin be checked. If negative that supports this enteropathy problem. If positive not so helpful. 4. I will discuss alternative blood pressure medications with his primary care physician. 5. Further plans and in clinical course and the results. He knows to call back if things worsen. He also understands that we have less diagnostic accuracy with this approach but accepts this.  CC: Adella Hare, MD   Discussed with Dr. Linda Hedges - will Rx with amlodipine 5 mg qd and HCTZ 25 mg qd I will get BMET in 2 weeks (at risk of low k with diarrhea and HCTZ) and BP check when he comes  and will cc Dr. Linda Hedges

## 2012-03-15 NOTE — Patient Instructions (Addendum)
Your physician has requested that you go to the basement for the following lab work before leaving today: Stool studies  Hold your AZOR as that may be causing some of your symptoms.  Dr. Carlean Purl will be in touch with Dr. Linda Hedges about other options for a blood pressure medicine.  We hope to be in touch by the end of the week with you.    Thank you for choosing me and [facility].  Gatha Mayer, M.D., [facility]

## 2012-03-15 NOTE — Assessment & Plan Note (Signed)
Not convinced his diarrhea is related to Crohn's ? olmesartan I will check stool WBC Also hold amlodipine-olmesartan

## 2012-03-16 MED ORDER — HYDROCHLOROTHIAZIDE 25 MG PO TABS: 25.0000 mg | ORAL_TABLET | Freq: Every day | ORAL | Status: DC

## 2012-03-16 MED ORDER — AMLODIPINE BESYLATE 5 MG PO TABS: 5.0000 mg | ORAL_TABLET | Freq: Every day | ORAL | Status: DC

## 2012-03-17 ENCOUNTER — Telehealth: Payer: Self-pay

## 2012-03-17 DIAGNOSIS — K5090 Crohn's disease, unspecified, without complications: Secondary | ICD-10-CM

## 2012-03-17 NOTE — Telephone Encounter (Signed)
Left message for patient to call back  

## 2012-03-17 NOTE — Telephone Encounter (Signed)
Patient aware he will come for BMET, BP check, and symptom update prior to his remicade infusion on 03/30/12

## 2012-03-17 NOTE — Telephone Encounter (Signed)
Message copied by Marlon Pel on Thu Mar 17, 2012  3:14 PM ------      Message from: Silvano Rusk E      Created: Wed Mar 16, 2012  4:43 PM      Regarding: new BP meds       Let him know I rxed amlodipine 49m daily and HCTZ 25 mg daily fr his BP after discussing with Dr. NLinda Hedges           Needs MET in 2 weeks and BP check then in our offic plus I will want a symptom update re: diarrhea            HCTZ is diuretic and may make him urinate more

## 2012-03-18 ENCOUNTER — Other Ambulatory Visit: Payer: Self-pay

## 2012-03-18 DIAGNOSIS — R197 Diarrhea, unspecified: Secondary | ICD-10-CM

## 2012-03-19 LAB — FECAL LACTOFERRIN, QUANT: Lactoferrin: NEGATIVE

## 2012-03-21 ENCOUNTER — Other Ambulatory Visit: Payer: Self-pay

## 2012-03-21 MED ORDER — DICYCLOMINE HCL 20 MG PO TABS: 20.0000 mg | ORAL_TABLET | Freq: Four times a day (QID) | ORAL | Status: DC | PRN

## 2012-03-21 NOTE — Progress Notes (Signed)
Quick Note:  No inflammation in stool -  Is he any better yet (may not be) If he thinks he is worse or needs additional symptomatic therapy - let me know I think it could be his BP med (olmesartan) or also stress/IBS could be playing a role, with negative lactoferrin Crohn's unlikely ______

## 2012-03-21 NOTE — Progress Notes (Signed)
Quick Note:  Have him try dicyclomine 20 mg every 6 hours as needed #90 - should be able to get for $10 at [facility] Ask him to update me on Friday - sooner if worse ______

## 2012-03-29 ENCOUNTER — Other Ambulatory Visit: Payer: Self-pay

## 2012-03-29 DIAGNOSIS — K5090 Crohn's disease, unspecified, without complications: Secondary | ICD-10-CM

## 2012-03-30 ENCOUNTER — Ambulatory Visit: Payer: Self-pay | Admitting: Internal Medicine

## 2012-03-30 ENCOUNTER — Encounter (HOSPITAL_COMMUNITY)
Admission: RE | Admit: 2012-03-30 | Discharge: 2012-03-30 | Disposition: A | Discharge status: Home / Self Care | Payer: 59 | Source: Ambulatory Visit | Attending: Internal Medicine | Admitting: Internal Medicine

## 2012-03-30 VITALS — BP 138/84

## 2012-03-30 VITALS — BP 139/89 | HR 73 | Temp 97.8°F | Resp 18 | Wt 239.4 lb

## 2012-03-30 DIAGNOSIS — K5090 Crohn's disease, unspecified, without complications: Secondary | ICD-10-CM

## 2012-03-30 MED ORDER — DIPHENHYDRAMINE HCL 25 MG PO TABS: 50.0000 mg | ORAL_TABLET | Freq: Every day | ORAL | Status: DC

## 2012-03-30 MED ORDER — SODIUM CHLORIDE 0.9 % IV SOLN: INTRAVENOUS | Status: DC

## 2012-03-30 MED ORDER — SODIUM CHLORIDE 0.9 % IV SOLN
5.0000 mg/kg | INTRAVENOUS | Status: DC
  Administered 2012-03-30: 500 mg via INTRAVENOUS
  Filled 2012-03-30: qty 50

## 2012-03-30 MED ORDER — ACETAMINOPHEN 325 MG PO TABS
650.0000 mg | ORAL_TABLET | Freq: Every day | ORAL | Status: DC
  Administered 2012-03-30: 650 mg via ORAL
  Filled 2012-03-30: qty 2

## 2012-03-30 MED ORDER — DIPHENHYDRAMINE HCL 25 MG PO CAPS
50.0000 mg | ORAL_CAPSULE | Freq: Every day | ORAL | Status: DC
  Administered 2012-03-30: 50 mg via ORAL
  Filled 2012-03-30: qty 2

## 2012-03-30 NOTE — Patient Instructions (Signed)
Patient states even after stopping the BP medicine he has intermittant diarrhea with some BRB in stool starting yesterday. Also having abdominal crampy pain.

## 2012-04-01 ENCOUNTER — Telehealth: Payer: Self-pay | Admitting: Internal Medicine

## 2012-04-01 MED ORDER — PREDNISONE 10 MG PO TABS: 40.0000 mg | ORAL_TABLET | Freq: Every day | ORAL | Status: DC

## 2012-04-01 NOTE — Telephone Encounter (Signed)
Still with abdominal cramps and diarrhea with new rectal bleeding.  Not clear what issue is - fecal lactoferrin was negative  I do ? If Crohn's flare - will give a trial of prednisone  He is to call back early next week with an update

## 2012-04-05 ENCOUNTER — Telehealth: Payer: Self-pay

## 2012-04-05 NOTE — Telephone Encounter (Signed)
Left message for patient to call back  

## 2012-04-05 NOTE — Telephone Encounter (Signed)
Message copied by Marlon Pel on Tue Apr 05, 2012 11:48 AM ------      Message from: Gatha Mayer      Created: Tue Apr 05, 2012  8:33 AM      Regarding: check in       If he doesn't call in today give him a call and see if prednisone I started is making a difference

## 2012-04-06 NOTE — Telephone Encounter (Signed)
There has been an improvement on prednisone. He reports that his stool is still loose, but not having as many BM a day.  He is currently on 30 mg of prednisone

## 2012-04-06 NOTE — Telephone Encounter (Signed)
Left message for patient to call back  

## 2012-04-07 NOTE — Telephone Encounter (Signed)
Stay on 30 mg daily until Friday then go to 20 mg daily for 2 weeks then if doing ok to 15 mg daily (1 1/2 tabs) See me 1 month (at 15 mg daily prednione) If not improving or worse call back sooner

## 2012-04-07 NOTE — Telephone Encounter (Signed)
Patient aware.  REV scheduled for 05/10/12.  He is aware to call us back if his symptoms don't continue to improve or worsen

## 2012-04-07 NOTE — Telephone Encounter (Signed)
Left message for patient to call back  

## 2012-05-10 ENCOUNTER — Encounter: Payer: Self-pay | Admitting: Internal Medicine

## 2012-05-10 ENCOUNTER — Ambulatory Visit (INDEPENDENT_AMBULATORY_CARE_PROVIDER_SITE_OTHER): Payer: Self-pay | Admitting: Internal Medicine

## 2012-05-10 ENCOUNTER — Telehealth: Payer: Self-pay

## 2012-05-10 VITALS — BP 164/105 | HR 85 | Temp 98.0°F | Ht 69.0 in | Wt 246.5 lb

## 2012-05-10 VITALS — BP 150/100 | HR 100 | Ht 69.0 in | Wt 246.0 lb

## 2012-05-10 DIAGNOSIS — K5080 Crohn's disease of both small and large intestine without complications: Secondary | ICD-10-CM

## 2012-05-10 DIAGNOSIS — R21 Rash and other nonspecific skin eruption: Secondary | ICD-10-CM

## 2012-05-10 DIAGNOSIS — B029 Zoster without complications: Secondary | ICD-10-CM

## 2012-05-10 DIAGNOSIS — R197 Diarrhea, unspecified: Secondary | ICD-10-CM

## 2012-05-10 DIAGNOSIS — Z79899 Other long term (current) drug therapy: Secondary | ICD-10-CM

## 2012-05-10 MED ORDER — PREDNISONE 10 MG PO TABS: 5.0000 mg | ORAL_TABLET | Freq: Every day | ORAL | Status: AC

## 2012-05-10 MED ORDER — HYDROCODONE-ACETAMINOPHEN 5-325 MG PO TABS: 1.0000 | ORAL_TABLET | Freq: Four times a day (QID) | ORAL | Status: AC | PRN

## 2012-05-10 MED ORDER — ACYCLOVIR 200 MG PO CAPS: 800.0000 mg | ORAL_CAPSULE | Freq: Every day | ORAL | Status: DC

## 2012-05-10 NOTE — Patient Instructions (Addendum)
We have sent the following medications to your pharmacy for you to pick up at your convenience: Prednisone and Norco  You have been made an appointment with Dr. Baxter Flattery with ID for 2:00 pm today and we will be back in touch today regarding your Remicade infusion.  Thank you for choosing me and [facility].  Gatha Mayer, M.D., [facility]

## 2012-05-10 NOTE — Progress Notes (Signed)
INFECTIOUS DISEASES CLINIC NOTE  RFV:  Recurrent zoster Subjective:    Patient ID: [name] [name], male    DOB: Dec 29, 1975, 36 y.o.   MRN: [id number]  HPIDennis is a 36yo Male with ileocolic Crohn's disease status post a right hemicolectomy. Maintained on prednisone taper and  Remicade, 5 mg per kilogram IV every 6 weeks that had intermittently stopped due to loss of insurance but to resume tomorrow to treat his IBD. The patient also has history of having post herpetic neuralgia for which he was previously on lyrica and methadone in the past. He was also vaccinated for zoster in the past year  He states that he started to having increasing pain in setting of new small pustular lesions start on his left chest wall which started 5-6 days ago. He states that the pain and rash continue to worsen with lesions that are also on his right side of chest. Denies itching but subscribes to pain, burning sensation, very similar to his previous outbreaks.  He initially had shingles in [employer] school affecting his right shoulder blade, then 2nd episode on his left chest wall. The lesions have yet to become large or vesicular in nature as of yet. He denies fever,chills, nightsweats, shortness of breath cough, or abdominal pain. He was seen by Dr. Carlean Purl who has given some pain medications which has helped significantly.  Current Outpatient Prescriptions on File Prior to Visit  Medication Sig Dispense Refill  . acetaminophen (TYLENOL) 325 MG tablet Take 650 mg by mouth as needed. PAIN      . amLODipine (NORVASC) 5 MG tablet Take 1 tablet (5 mg total) by mouth daily.  30 tablet  5  . dicyclomine (BENTYL) 20 MG tablet Take 1 tablet (20 mg total) by mouth every 6 (six) hours as needed.  90 tablet  1  . hydrochlorothiazide (HYDRODIURIL) 25 MG tablet Take 1 tablet (25 mg total) by mouth daily.  30 tablet  5  . HYDROcodone-acetaminophen (NORCO/VICODIN) 5-325 MG per tablet Take 1 tablet by mouth every 6 (six) hours as  needed for pain.  30 tablet  0  . inFLIXimab (REMICADE) 100 MG injection Remicade 5 mg/kg IV q 6 weeks  1 each  3  . PARoxetine (PAXIL) 40 MG tablet TAKE 1 TABLET DAILY  90 tablet  3  . predniSONE (DELTASONE) 10 MG tablet Take 0.5 tablets (5 mg total) by mouth daily. After 2 weeks stop  100 tablet  1  . Probiotic Product (TRUBIOTICS PO) Take 1 capsule by mouth daily.       Active Ambulatory Problems    Diagnosis Date Noted  . OBESITY, UNSPECIFIED 07/20/2007  . ANXIETY 07/20/2007  . DEPRESSION 07/20/2007  . OBSTRUCTIVE SLEEP APNEA 07/20/2007  . Essential hypertension, benign 07/20/2007  . CROHN'S DISEASE, LARGE AND SMALL INTESTINES 07/20/2007  . Encounter for long-term (current) use of other medications 10/14/2010  . Immunosuppression 10/14/2010  . Herpes zoster infection 01/05/2011  . Post herpetic neuralgia 01/28/2011  . CMV (cytomegalovirus infection) 03/03/2011  . Headache 10/07/2011  . Exposure to Coccidioides immitis 11/19/2011   Resolved Ambulatory Problems    Diagnosis Date Noted  . NAUSEA AND VOMITING 02/18/2010  . HERPES ZOSTER 09/02/2010  . CMV (cytomegalovirus infection) 10/28/2010  . Elevated LFTs 11/13/2010  . Diaphoresis 04/10/2011  . Diplopia 10/14/2011   Past Medical History  Diagnosis Date  . Anxiety and depression   . Vitamin D deficiency   . Crohn's disease   . Hyperlipidemia   . Nephrolithiasis   .  Fistula, perirectal   . Sinus infection 08/05/11  . Gastritis   . Duodenitis       Review of Systems Review of Systems  Constitutional: Negative for fever, chills, diaphoresis, activity change, appetite change, fatigue and unexpected weight change.  HENT: Negative for congestion, sore throat, rhinorrhea, sneezing, trouble swallowing and sinus pressure.  Eyes: Negative for photophobia and visual disturbance.  Respiratory: Negative for cough, chest tightness, shortness of breath, wheezing and stridor.  Cardiovascular: Negative for chest pain,  palpitations and leg swelling.  Gastrointestinal: Negative for nausea, vomiting, abdominal pain, diarrhea, constipation, blood in stool, abdominal distention and anal bleeding.  Genitourinary: Negative for dysuria, hematuria, flank pain and difficulty urinating.  Musculoskeletal: Negative for myalgias, back pain, joint swelling, arthralgias and gait problem.  Skin: per hpi  Neurological: pain to left chest wall.Negative for dizziness, tremors, weakness and light-headedness.  Hematological: Negative for adenopathy. Does not bruise/bleed easily.  Psychiatric/Behavioral: Negative for behavioral problems, confusion, sleep disturbance, dysphoric mood, decreased concentration and agitation.       Objective:   Physical Exam  BP 164/105  Pulse 85  Temp 98 F (36.7 C) (Oral)  Ht 5' 9"  (1.753 m)  Wt 246 lb 8 oz (111.812 kg)  BMI 36.40 kg/m2 Physical Exam  Constitutional: He is oriented to person, place, and time. He appears well-developed and well-nourished. No distress.  HENT:  Mouth/Throat: Oropharynx is clear and moist. No oropharyngeal exudate.  Cardiovascular: Normal rate, regular rhythm and normal heart sounds. Exam reveals no gallop and no friction rub.  No murmur heard.  Pulmonary/Chest: Effort normal and breath sounds normal. No respiratory distress. He has no wheezes.  Abdominal: Soft. Bowel sounds are normal. He exhibits no distension. There is no tenderness.  Lymphadenopathy:  He has no cervical adenopathy.  Neurological: He is alert and oriented to person, place, and time.  Skin: Scattered small submm pustules, pinpoint on red base noted in t6-t8 dermatome anterior left sided chest wall with some lesions on right side Psychiatric: He has a normal mood and affect. His behavior is normal.         Assessment & Plan:  36 yo Male with Crohn's disease on low dose prednisone presents with presumed recurrent zoster x 5-6 days. Clinical history suggests recurrent of shingles but  physical exam not as impressive and may evolve to developing vesicles and bullae. He appears to be having significant pain associated with rash.  - although not as effective as if we could have started < 72hr of rash, will do a trial of antivirals with acyclovir 845m 5 x per day x 7 days in order to minimize acute herpetic neuralgia.  - will ask him to send photos and check in with uKoreain 3-4 days to see if symptoms improving  - will recommend to post pone humira for another 7-10DAYS.  HEALTH MAINTENANCE=recommend he gets flu shot at his next doctors visit  [facility]for [facility] Diseases [phone]

## 2012-05-10 NOTE — Telephone Encounter (Signed)
LM for patient letting him know remicade infusion canceled for tomorrow per Dr. Carlean Purl and Dr. Baxter Flattery.

## 2012-05-10 NOTE — Progress Notes (Signed)
  Subjective:    Patient ID: [name] [name], male    DOB: 1975-10-29, 36 y.o.   MRN: [id number]  HPI [name] [name] returns for followup of his Crohn's ileocolitis. He developed diarrhea, I thought that it might be from the olmesartan he was taking, as a sprue-like illness has been described with that. We changed his blood pressure medicine but the diarrhea persisted, infectious workup negative. Prednisone was begun at 40 mg daily he is now on 10 mg daily and has no diarrhea. However about 5-6 days ago he developed a rash on his upper abdomen and lower chest area. He said is painful and feels just like the shingles he has had in the past however this is a bilateral rash. He is using Tylenol but that is not helping the pain. He is not describing fever. There is no abdominal pain. He is due for a Remicade infusion tomorrow. Is asking about a flu shot today though thinks he may not get back to his suspected shingles Medications, allergies, past medical history, past surgical history, family history and social history are reviewed and updated in the EMR.  Review of Systems As above    Objective:   Physical Exam General:  NAD Eyes:   anicteric Lungs:  clear Heart:  S1S2 no rubs, murmurs or gallops Abdomen:  Soft, tender upper abdomen around T6 dermatome with papular pustular rash there, bilateral Ext:   no edema       Assessment & Plan:   1. Regional enteritis of small intestine with large intestine   2. Diarrhea   This is improved, presumably from a Crohn's flare. Would leave him on his generic blood pressure medications, he notes and I note that his blood pressure is elevated today, pain may be doing that.   3. Rash  ? recurrent bilateral zoster     sounds like zoster but I have never seen at to be bilateral, I had the my colleagues look at as well and we think he could be a pustular rash associated with that. He will be seen by infectious disease specialist today, for another opinion. In the  meantime we need to hold his Remicade infusion.   4. Long-term use of immunosuppressant medication

## 2012-05-11 ENCOUNTER — Encounter (HOSPITAL_COMMUNITY): Admission: RE | Admit: 2012-05-11 | Payer: 59 | Source: Ambulatory Visit

## 2012-05-21 ENCOUNTER — Other Ambulatory Visit: Payer: Self-pay | Admitting: Internal Medicine

## 2012-05-21 NOTE — Progress Notes (Signed)
Called in rx of vicodin 5/325, 1 tab Q6hr PRN for pain. #30 with NR for pain associated with shingles. I will ask him to pursue discussing pain management with his PCP

## 2012-05-30 ENCOUNTER — Telehealth: Payer: Self-pay | Admitting: Internal Medicine

## 2012-05-30 NOTE — Telephone Encounter (Signed)
Patient had recent shingles episode.  He was placed on antivirals and the rash never came out, but Dr. Baxter Flattery was convinced that it was shingles.  He states he still has the shingles pain, but no visible rash.  When can he resume Remicade?

## 2012-05-31 ENCOUNTER — Telehealth: Payer: Self-pay | Admitting: Internal Medicine

## 2012-05-31 NOTE — Telephone Encounter (Signed)
Ok to resume Remicade Tell [name] [name] to schedule a follow-up with me I will try to help him with the pain - if he needs meds he should ask me

## 2012-05-31 NOTE — Telephone Encounter (Signed)
See encounter from yesterday

## 2012-06-01 ENCOUNTER — Other Ambulatory Visit: Payer: Self-pay

## 2012-06-01 DIAGNOSIS — K5090 Crohn's disease, unspecified, without complications: Secondary | ICD-10-CM

## 2012-06-01 NOTE — Telephone Encounter (Signed)
Left message for patient to call back Remicade rescheduled for 06/10/12 10:00, new orders placed

## 2012-06-01 NOTE — Telephone Encounter (Signed)
Patient advised that Remicade for 06/10/12 10:00.   Dr. Tomasa Blase gave him a rx for norco.  He only has two pills left.  He is taking at night to sleep, rarely taking it during the day.  He is requesting a refill

## 2012-06-01 NOTE — Telephone Encounter (Signed)
Please print out a refill for #45 of those and I will sign it

## 2012-06-02 MED ORDER — HYDROCODONE-ACETAMINOPHEN 5-325 MG PO TABS: 1.0000 | ORAL_TABLET | Freq: Four times a day (QID) | ORAL | Status: DC | PRN

## 2012-06-02 NOTE — Telephone Encounter (Signed)
I have faxed RX, I left Anders a message to pick up rx

## 2012-06-10 ENCOUNTER — Encounter (HOSPITAL_COMMUNITY)
Admission: RE | Admit: 2012-06-10 | Payer: 59 | Source: Ambulatory Visit | Attending: Internal Medicine | Admitting: Internal Medicine

## 2012-06-16 ENCOUNTER — Ambulatory Visit (HOSPITAL_COMMUNITY)
Admission: RE | Admit: 2012-06-16 | Discharge: 2012-06-16 | Disposition: A | Discharge status: Home / Self Care | Payer: Self-pay | Source: Ambulatory Visit | Attending: Internal Medicine | Admitting: Internal Medicine

## 2012-06-16 ENCOUNTER — Encounter (HOSPITAL_COMMUNITY): Payer: Self-pay

## 2012-06-16 VITALS — BP 152/84 | HR 73 | Temp 98.5°F | Resp 19 | Wt 246.4 lb

## 2012-06-16 DIAGNOSIS — K5090 Crohn's disease, unspecified, without complications: Secondary | ICD-10-CM | POA: Insufficient documentation

## 2012-06-16 DIAGNOSIS — Z5189 Encounter for other specified aftercare: Secondary | ICD-10-CM | POA: Insufficient documentation

## 2012-06-16 MED ORDER — SODIUM CHLORIDE 0.9 % IV SOLN
5.0000 mg/kg | INTRAVENOUS | Status: DC
  Administered 2012-06-16: 600 mg via INTRAVENOUS
  Filled 2012-06-16: qty 60

## 2012-06-16 MED ORDER — SODIUM CHLORIDE 0.9 % IV SOLN
INTRAVENOUS | Status: DC
  Administered 2012-06-16: 250 mL via INTRAVENOUS

## 2012-06-16 MED ORDER — DIPHENHYDRAMINE HCL 25 MG PO TABS
50.0000 mg | ORAL_TABLET | Freq: Every day | ORAL | Status: DC
  Administered 2012-06-16: 50 mg via ORAL
  Filled 2012-06-16 (×3): qty 2

## 2012-06-16 MED ORDER — ACETAMINOPHEN 325 MG PO TABS
650.0000 mg | ORAL_TABLET | Freq: Every day | ORAL | Status: DC
  Administered 2012-06-16: 650 mg via ORAL
  Filled 2012-06-16: qty 2

## 2012-06-28 ENCOUNTER — Telehealth: Payer: Self-pay | Admitting: Internal Medicine

## 2012-06-28 NOTE — Telephone Encounter (Signed)
Patient is due for office visit he will come in tomorrow at 10:45

## 2012-06-29 ENCOUNTER — Ambulatory Visit: Payer: Self-pay | Admitting: Internal Medicine

## 2012-07-21 ENCOUNTER — Encounter: Payer: Self-pay | Admitting: Internal Medicine

## 2012-07-21 ENCOUNTER — Ambulatory Visit (INDEPENDENT_AMBULATORY_CARE_PROVIDER_SITE_OTHER): Payer: Self-pay | Admitting: Internal Medicine

## 2012-07-21 VITALS — BP 162/106 | HR 80 | Ht 67.25 in | Wt 240.2 lb

## 2012-07-21 DIAGNOSIS — F329 Major depressive disorder, single episode, unspecified: Secondary | ICD-10-CM

## 2012-07-21 DIAGNOSIS — Z23 Encounter for immunization: Secondary | ICD-10-CM

## 2012-07-21 DIAGNOSIS — I10 Essential (primary) hypertension: Secondary | ICD-10-CM

## 2012-07-21 DIAGNOSIS — F32A Depression, unspecified: Secondary | ICD-10-CM

## 2012-07-21 DIAGNOSIS — R197 Diarrhea, unspecified: Secondary | ICD-10-CM

## 2012-07-21 DIAGNOSIS — F3289 Other specified depressive episodes: Secondary | ICD-10-CM

## 2012-07-21 DIAGNOSIS — K5080 Crohn's disease of both small and large intestine without complications: Secondary | ICD-10-CM

## 2012-07-21 MED ORDER — LISINOPRIL-HYDROCHLOROTHIAZIDE 20-25 MG PO TABS: 1.0000 | ORAL_TABLET | Freq: Every day | ORAL | Status: DC

## 2012-07-21 MED ORDER — NA SULFATE-K SULFATE-MG SULF 17.5-3.13-1.6 GM/177ML PO SOLN: ORAL | Status: DC

## 2012-07-21 MED ORDER — HYDROCODONE-ACETAMINOPHEN 5-325 MG PO TABS: 1.0000 | ORAL_TABLET | Freq: Four times a day (QID) | ORAL | Status: DC | PRN

## 2012-07-21 MED ORDER — PAROXETINE HCL 20 MG PO TABS: 40.0000 mg | ORAL_TABLET | ORAL | Status: DC

## 2012-07-21 NOTE — Progress Notes (Signed)
  Subjective:    Patient ID: [name] [name], male    DOB: 08-Nov-1975, 37 y.o.   MRN: [id number]  HPI [name] [name] returns to follow-up for Crohn's disease. He was last seen in November - at which time he was complaining of diarrhea. Stool lactoferrin was negative. We tried dicyclomine but it did not help. He also had a likely recurrence of zoster (bilateral) and was treated with valcyclovir by Dr. Baxter Flattery. His rash is gone but he again has post-herpetic neuralgia - though not as severe as the last time. He is requiring one hydrocodone-APAP to sleep (due to pain). Having 3 or so loose stools every AM. No weight loss. He is not having much abdominal pain and there is no bleeding or fever.  He is using half doses of his BP meds and paroxetine. Income remains down - working part-time at [employer]. Needs refills and needs influenza vaccine. He has gotten onto [employer] (orange card) program.  Medications, allergies, past medical history, past surgical history, family history and social history are reviewed and updated in the EMR.   Review of Systems Mild sore throat and nasal congestion past few days    Objective:   Physical Exam General:  NAD Eyes:   anicteric Lungs:  clear Heart:  S1S2 no rubs, murmurs or gallops Abdomen:  Soft, mildly tender with fullness in RLQ, BS+ Ext:   no edema     Assessment & Plan:   1. Crohn's disease, small and large intestine  Ambulatory referral to Gastroenterology  2. Diarrhea  Ambulatory referral to Gastroenterology  3. Hypertension, uncontrolled  lisinopril-hydrochlorothiazide (PRINZIDE,ZESTORETIC) 20-25 MG per tablet  4. Depression  PARoxetine (PAXIL) 20 MG tablet  5. Need for prophylactic vaccination and inoculation against influenza     1. Schedule colonoscopy to assess Crohn's disease and diarrhea The risks and benefits as well as alternatives of endoscopic procedure(s) have been discussed and reviewed. All questions answered. The patient agrees to proceed. Change  BP meds to lisinopril HCTZ combo - cheaper for him as is a $4 generic - he is to check BP at home/drugstore and let me know Refill paroxetine - use 20 mg tabs 2 a day - $4 Rx Influenza vaccine

## 2012-07-21 NOTE — Patient Instructions (Addendum)
You have been scheduled for a colonoscopy with propofol. Please follow written instructions given to you at your visit today.  Please use the suprep kit you have been given today. If you use inhalers (even only as needed) or a CPAP machine, please bring them with you on the day of your procedure.  You have been given a flu vaccine today.  You may use Imodium as needed for diarrhea.   You have been given a written rx for Norco to take with you today, also medicine has been sent to [facility] for you to pick up.  Thank you for choosing me and [facility].  Gatha Mayer, M.D., [facility]

## 2012-07-27 ENCOUNTER — Other Ambulatory Visit: Payer: Self-pay

## 2012-07-27 DIAGNOSIS — K5090 Crohn's disease, unspecified, without complications: Secondary | ICD-10-CM

## 2012-07-28 ENCOUNTER — Encounter (HOSPITAL_COMMUNITY)
Admission: RE | Admit: 2012-07-28 | Discharge: 2012-07-28 | Disposition: A | Discharge status: Home / Self Care | Payer: Self-pay | Source: Ambulatory Visit | Attending: Internal Medicine | Admitting: Internal Medicine

## 2012-07-28 VITALS — BP 126/72 | HR 55 | Temp 96.9°F | Resp 18 | Wt 235.2 lb

## 2012-07-28 DIAGNOSIS — K5090 Crohn's disease, unspecified, without complications: Secondary | ICD-10-CM | POA: Insufficient documentation

## 2012-07-28 MED ORDER — DIPHENHYDRAMINE HCL 25 MG PO CAPS
50.0000 mg | ORAL_CAPSULE | Freq: Every day | ORAL | Status: DC
  Administered 2012-07-28: 50 mg via ORAL
  Filled 2012-07-28 (×3): qty 2

## 2012-07-28 MED ORDER — SODIUM CHLORIDE 0.9 % IV SOLN
5.0000 mg/kg | INTRAVENOUS | Status: DC
  Administered 2012-07-28: 500 mg via INTRAVENOUS
  Filled 2012-07-28: qty 50

## 2012-07-28 MED ORDER — ACETAMINOPHEN 325 MG PO TABS
650.0000 mg | ORAL_TABLET | Freq: Every day | ORAL | Status: DC
  Administered 2012-07-28: 650 mg via ORAL
  Filled 2012-07-28: qty 2

## 2012-07-28 MED ORDER — SODIUM CHLORIDE 0.9 % IV SOLN: INTRAVENOUS | Status: DC

## 2012-07-28 MED ORDER — SODIUM CHLORIDE 0.9 % IV SOLN
INTRAVENOUS | Status: DC
  Administered 2012-07-28: 08:00:00 via INTRAVENOUS

## 2012-08-02 ENCOUNTER — Encounter: Payer: Self-pay | Admitting: Internal Medicine

## 2012-08-02 ENCOUNTER — Ambulatory Visit (AMBULATORY_SURGERY_CENTER): Payer: Self-pay | Admitting: Internal Medicine

## 2012-08-02 VITALS — BP 121/85 | HR 59 | Temp 98.0°F | Resp 24 | Ht 67.0 in | Wt 240.0 lb

## 2012-08-02 DIAGNOSIS — K5080 Crohn's disease of both small and large intestine without complications: Secondary | ICD-10-CM

## 2012-08-02 DIAGNOSIS — R197 Diarrhea, unspecified: Secondary | ICD-10-CM

## 2012-08-02 MED ORDER — SODIUM CHLORIDE 0.9 % IV SOLN: 500.0000 mL | INTRAVENOUS | Status: DC

## 2012-08-02 NOTE — Op Note (Signed)
[place]  [address]. [place], [address]   COLONOSCOPY PROCEDURE REPORT  PATIENT: [name] [name], [name] [name]  MR#: [id number] BIRTHDATE: 09/24/1975 , 36  yrs. old GENDER: Male ENDOSCOPIST: Gatha Mayer, MD, [facility] PROCEDURE DATE:  08/02/2012 PROCEDURE:   Colonoscopy, diagnostic ASA CLASS:   Class II INDICATIONS:unexplained diarrhea and High risk patient with previously diagnosed Crohn's disease: large & small intestine. MEDICATIONS: propofol (Diprivan) 261m IV, MAC sedation, administered by CRNA, and These medications were titrated to patient response per physician's verbal order  DESCRIPTION OF PROCEDURE:   After the risks benefits and alternatives of the procedure were thoroughly explained, informed consent was obtained.  A digital rectal exam revealed no rectal mass.   The LB [id number] endoscope was introduced through the anus and advanced to the ileum. No adverse events experienced. The quality of the prep was Suprep adequate  The instrument was then slowly withdrawn as the colon was fully examined.      COLON FINDINGS: There was a there was a narrow rectal vault so retroflexion was not performed.   The transverse, splenic flexure, descending, sigmoid colon and rectum appeared unremarkable.  No polyps or cancers were seen.   There was evidence of a prior side-to-side ileocolonic surgical anastomosis in the transverse colon - it was normal.  The mucosa appeared normal in the terminal ileum.  Retroflexion was not performed due to a narrow rectal vault. The time to anastomosis=2 minutes 05 seconds.  Withdrawal time=8 minutes 54 seconds.  The scope was withdrawn and the procedure completed. COMPLICATIONS: There were no complications.  ENDOSCOPIC IMPRESSION: 1.   There was a narrow rectal vault 2.   Normal colon 3.   There was evidence of a prior ileocolonic surgical anastomosis in the transverse colon 4.   Normal mucosa in the terminal  ileum  RECOMMENDATIONS: will likely try bile salt binder   eSigned:  CGatha Mayer MD, FUniversity Of Wi Hospit[facility] Authority02/04/2013 2:36 PM   cc: The Patient

## 2012-08-02 NOTE — Progress Notes (Signed)
Lidocaine-40mg IV prior to Propofol InductionPropofol given over incremental dosages 

## 2012-08-02 NOTE — Patient Instructions (Addendum)
The exam was normal except for narrowing of the anal canal.  I will discuss possible treatment with you - may try to get some samples to try.  Thank you for choosing me and [facility].  Gatha Mayer, MD, FACG   YOU HAD AN ENDOSCOPIC PROCEDURE TODAY AT [facility]: Refer to the procedure report that was given to you for any specific questions about what was found during the examination.  If the procedure report does not answer your questions, please call your gastroenterologist to clarify.  If you requested that your care partner not be given the details of your procedure findings, then the procedure report has been included in a sealed envelope for you to review at your convenience later.  YOU SHOULD EXPECT: Some feelings of bloating in the abdomen. Passage of more gas than usual.  Walking can help get rid of the air that was put into your GI tract during the procedure and reduce the bloating. If you had a lower endoscopy (such as a colonoscopy or flexible sigmoidoscopy) you may notice spotting of blood in your stool or on the toilet paper. If you underwent a bowel prep for your procedure, then you may not have a normal bowel movement for a few days.  DIET: Your first meal following the procedure should be a light meal and then it is ok to progress to your normal diet.  A half-sandwich or bowl of soup is an example of a good first meal.  Heavy or fried foods are harder to digest and may make you feel nauseous or bloated.  Likewise meals heavy in dairy and vegetables can cause extra gas to form and this can also increase the bloating.  Drink plenty of fluids but you should avoid alcoholic beverages for 24 hours.  ACTIVITY: Your care partner should take you home directly after the procedure.  You should plan to take it easy, moving slowly for the rest of the day.  You can resume normal activity the day after the procedure however you should NOT DRIVE or use heavy  machinery for 24 hours (because of the sedation medicines used during the test).    SYMPTOMS TO REPORT IMMEDIATELY: A gastroenterologist can be reached at any hour.  During normal business hours, 8:30 AM to 5:00 PM Monday through Friday, call [phone].  After hours and on weekends, please call the GI answering service at [phone] who will take a message and have the physician on call contact you.   Following lower endoscopy (colonoscopy or flexible sigmoidoscopy):  Excessive amounts of blood in the stool  Significant tenderness or worsening of abdominal pains  Swelling of the abdomen that is new, acute  Fever of 100F or higher  FOLLOW UP: If any biopsies were taken you will be contacted by phone or by letter within the next 1-3 weeks.  Call your gastroenterologist if you have not heard about the biopsies in 3 weeks.  Our staff will call the home number listed on your records the next business day following your procedure to check on you and address any questions or concerns that you may have at that time regarding the information given to you following your procedure. This is a courtesy call and so if there is no answer at the home number and we have not heard from you through the emergency physician on call, we will assume that you have returned to your regular daily activities without incident.  SIGNATURES/CONFIDENTIALITY: You and/or your  care partner have signed paperwork which will be entered into your electronic medical record.  These signatures attest to the fact that that the information above on your After Visit Summary has been reviewed and is understood.  Full responsibility of the confidentiality of this discharge information lies with you and/or your care-partner.

## 2012-08-02 NOTE — Progress Notes (Signed)
Patient did not experience any of the following events: a burn prior to discharge; a fall within the facility; wrong site/side/patient/procedure/implant event; or a hospital transfer or hospital admission upon discharge from the facility. (G8907) Patient did not have preoperative order for IV antibiotic SSI prophylaxis. (G8918)  

## 2012-08-03 ENCOUNTER — Telehealth: Payer: Self-pay | Admitting: *Deleted

## 2012-08-03 NOTE — Telephone Encounter (Signed)
Left message that we called for f/u

## 2012-08-09 ENCOUNTER — Telehealth: Payer: Self-pay

## 2012-08-09 MED ORDER — COLESEVELAM HCL 625 MG PO TABS: 1875.0000 mg | ORAL_TABLET | Freq: Two times a day (BID) | ORAL | Status: DC

## 2012-08-09 NOTE — Telephone Encounter (Signed)
Informed patient that Welchol samples up front for pick up.  Put note with instructions in with the samples and he will call us back in 2 weeks with an update.    Per Dr. Silvano Rusk  Tell him I have samples to try to help his diarrhea      Take 2 Welchol with lunch and supper to see what happens to diarrhea      It binds bile acids which may be causing his diarrhea      He should call back with report in 2 weeks

## 2012-08-25 ENCOUNTER — Telehealth: Payer: Self-pay | Admitting: Internal Medicine

## 2012-08-25 MED ORDER — DIPHENOXYLATE-ATROPINE 2.5-0.025 MG PO TABS: ORAL_TABLET | ORAL | Status: DC

## 2012-08-25 NOTE — Telephone Encounter (Signed)
Patient called me back and left a voicemail that he is out of meds and still having diarrhea.  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  Dr. Carlean Purl please advise

## 2012-08-25 NOTE — Telephone Encounter (Signed)
Left message for patient to call back  

## 2012-08-25 NOTE — Telephone Encounter (Signed)
Patient aware.  [facility].   He will call back for continued diarrhea and if the lomotil helps for a refill

## 2012-08-25 NOTE — Telephone Encounter (Signed)
I am assuming he is saying that the [facility] trial did not help  He can try Lomotil 1-2 qid prn to see if that will help would do a small Rx at first like 20 since he pays cash

## 2012-09-08 ENCOUNTER — Encounter (HOSPITAL_COMMUNITY)
Admission: RE | Admit: 2012-09-08 | Discharge: 2012-09-08 | Disposition: A | Discharge status: Home / Self Care | Payer: Self-pay | Source: Ambulatory Visit | Attending: Internal Medicine | Admitting: Internal Medicine

## 2012-09-08 ENCOUNTER — Encounter (HOSPITAL_COMMUNITY): Payer: Self-pay

## 2012-09-08 VITALS — BP 130/95 | HR 70 | Temp 98.2°F | Resp 16 | Ht 69.0 in | Wt 229.6 lb

## 2012-09-08 DIAGNOSIS — K5090 Crohn's disease, unspecified, without complications: Secondary | ICD-10-CM | POA: Insufficient documentation

## 2012-09-08 MED ORDER — SODIUM CHLORIDE 0.9 % IV SOLN
5.0000 mg/kg | INTRAVENOUS | Status: DC
  Administered 2012-09-08: 500 mg via INTRAVENOUS
  Filled 2012-09-08: qty 50

## 2012-09-08 MED ORDER — DIPHENHYDRAMINE HCL 25 MG PO CAPS
50.0000 mg | ORAL_CAPSULE | Freq: Every day | ORAL | Status: DC
  Administered 2012-09-08: 50 mg via ORAL
  Filled 2012-09-08: qty 2

## 2012-09-08 MED ORDER — SODIUM CHLORIDE 0.9 % IV SOLN
INTRAVENOUS | Status: DC
  Administered 2012-09-08: 09:00:00 via INTRAVENOUS

## 2012-09-08 MED ORDER — ACETAMINOPHEN 325 MG PO TABS
650.0000 mg | ORAL_TABLET | Freq: Every day | ORAL | Status: DC
  Administered 2012-09-08: 650 mg via ORAL
  Filled 2012-09-08: qty 2

## 2012-09-27 ENCOUNTER — Telehealth: Payer: Self-pay | Admitting: Internal Medicine

## 2012-09-27 MED ORDER — HYDROCODONE-ACETAMINOPHEN 5-325 MG PO TABS: 1.0000 | ORAL_TABLET | Freq: Four times a day (QID) | ORAL | Status: DC | PRN

## 2012-09-27 NOTE — Telephone Encounter (Signed)
Rx printed out and was faxed to [facility] at # [phone].  LM for patient letting him know.

## 2012-09-27 NOTE — Telephone Encounter (Signed)
Ok but #30 no refills

## 2012-09-27 NOTE — Telephone Encounter (Signed)
Please advise if ok Sir, thank you.

## 2012-10-17 ENCOUNTER — Telehealth: Payer: Self-pay | Admitting: Internal Medicine

## 2012-10-17 ENCOUNTER — Other Ambulatory Visit: Payer: Self-pay

## 2012-10-17 DIAGNOSIS — K5090 Crohn's disease, unspecified, without complications: Secondary | ICD-10-CM

## 2012-10-17 NOTE — Telephone Encounter (Signed)
New Remicade orders placed

## 2012-10-18 ENCOUNTER — Other Ambulatory Visit: Payer: Self-pay | Admitting: Internal Medicine

## 2012-10-18 MED ORDER — DIPHENOXYLATE-ATROPINE 2.5-0.025 MG PO TABS: ORAL_TABLET | ORAL | Status: DC

## 2012-10-20 ENCOUNTER — Encounter (HOSPITAL_COMMUNITY): Admission: RE | Admit: 2012-10-20 | Payer: Managed Care, Other (non HMO) | Source: Ambulatory Visit

## 2012-10-21 ENCOUNTER — Encounter (HOSPITAL_COMMUNITY): Payer: Self-pay

## 2012-10-21 ENCOUNTER — Encounter (HOSPITAL_COMMUNITY)
Admission: RE | Admit: 2012-10-21 | Discharge: 2012-10-21 | Disposition: A | Discharge status: Home / Self Care | Payer: Managed Care, Other (non HMO) | Source: Ambulatory Visit | Attending: Internal Medicine | Admitting: Internal Medicine

## 2012-10-21 VITALS — BP 110/70 | HR 70 | Temp 97.8°F | Resp 18 | Ht 69.0 in | Wt 224.2 lb

## 2012-10-21 DIAGNOSIS — K5090 Crohn's disease, unspecified, without complications: Secondary | ICD-10-CM

## 2012-10-21 MED ORDER — DIPHENHYDRAMINE HCL 25 MG PO TABS
50.0000 mg | ORAL_TABLET | Freq: Every day | ORAL | Status: DC
  Administered 2012-10-21: 50 mg via ORAL
  Filled 2012-10-21 (×3): qty 2

## 2012-10-21 MED ORDER — SODIUM CHLORIDE 0.9 % IV SOLN
5.0000 mg/kg | INTRAVENOUS | Status: DC
  Administered 2012-10-21: 500 mg via INTRAVENOUS
  Filled 2012-10-21: qty 50

## 2012-10-21 MED ORDER — SODIUM CHLORIDE 0.9 % IV SOLN
INTRAVENOUS | Status: DC
  Administered 2012-10-21: 2502 mL via INTRAVENOUS

## 2012-10-21 MED ORDER — ACETAMINOPHEN 325 MG PO TABS
650.0000 mg | ORAL_TABLET | Freq: Every day | ORAL | Status: DC
  Administered 2012-10-21: 650 mg via ORAL
  Filled 2012-10-21: qty 2

## 2012-11-03 ENCOUNTER — Encounter (HOSPITAL_COMMUNITY): Payer: Managed Care, Other (non HMO)

## 2012-11-06 ENCOUNTER — Ambulatory Visit (INDEPENDENT_AMBULATORY_CARE_PROVIDER_SITE_OTHER): Payer: Private Health Insurance - Indemnity | Admitting: Emergency Medicine

## 2012-11-06 VITALS — BP 110/90 | HR 72 | Temp 98.1°F | Resp 18 | Ht 67.0 in | Wt 221.0 lb

## 2012-11-06 DIAGNOSIS — J039 Acute tonsillitis, unspecified: Secondary | ICD-10-CM

## 2012-11-06 MED ORDER — HYDROCODONE-ACETAMINOPHEN 5-325 MG PO TABS: 1.0000 | ORAL_TABLET | ORAL | Status: DC | PRN

## 2012-11-06 MED ORDER — PENICILLIN V POTASSIUM 500 MG PO TABS: 500.0000 mg | ORAL_TABLET | Freq: Four times a day (QID) | ORAL | Status: DC

## 2012-11-06 NOTE — Patient Instructions (Addendum)
Tonsillitis Tonsils are lumps of lymphoid tissues at the back of the throat. Each tonsil has 20 crevices (crypts). Tonsils help fight nose and throat infections and keep infection from spreading to other parts of the body for the first 18 months of life. Tonsillitis is an infection of the throat that causes the tonsils to become red, tender, and swollen. CAUSES Sudden and, if treated, temporary (acute) tonsillitis is usually caused by infection with streptococcal bacteria. Long lasting (chronic) tonsillitis occurs when the crypts of the tonsils become filled with pieces of food and bacteria, which makes it easy for the tonsils to become constantly infected. SYMPTOMS  Symptoms of tonsillitis include:  A sore throat.  White patches on the tonsils.  Fever.  Tiredness. DIAGNOSIS Tonsillitis can be diagnosed through a physical exam. Diagnosis can be confirmed with the results of lab tests, including a throat culture. TREATMENT  The goals of tonsillitis treatment include the reduction of the severity and duration of symptoms, prevention of associated conditions, and prevention of disease transmission. Tonsillitis caused by bacteria can be treated with antibiotics. Usually, treatment with antibiotics is started before the cause of the tonsillitis is known. However, if it is determined that the cause is not bacterial, antibiotics will not treat the tonsillitis. If attacks of tonsillitis are severe and frequent, your caregiver may recommend surgery to remove the tonsils (tonsillectomy). HOME CARE INSTRUCTIONS   Rest as much as possible and get plenty of sleep.  Drink plenty of fluids. While the throat is very sore, eat soft foods or liquids, such as sherbet, soups, or instant breakfast drinks.  Eat frozen ice pops.  Older children and adults may gargle with a warm or cold liquid to help soothe the throat. Mix 1 teaspoon of salt in 1 cup of water.  Other family members who also develop a sore  throat or fever should have a medical exam or throat culture.  Only take over-the-counter or prescription medicines for pain, discomfort, or fever as directed by your caregiver.  If you are given antibiotics, take them as directed. Finish them even if you start to feel better. SEEK MEDICAL CARE IF:   Your baby is older than 3 months with a rectal temperature of 100.5 F (38.1 C) or higher for more than 1 day.  Large, tender lumps develop in your neck.  A rash develops.  Green, yellow-brown, or bloody substance is coughed up.  You are unable to swallow liquids or food for 24 hours.  Your child is unable to swallow food or liquids for 12 hours. SEEK IMMEDIATE MEDICAL CARE IF:   You develop any new symptoms such as vomiting, severe headache, stiff neck, chest pain, or trouble breathing or swallowing.  You have severe throat pain along with drooling or voice changes.  You have severe pain, unrelieved with recommended medications.  You are unable to fully open the mouth.  You develop redness, swelling, or severe pain anywhere in the neck.  You have a fever.  Your baby is older than 3 months with a rectal temperature of 102 F (38.9 C) or higher.  Your baby is 39 months old or younger with a rectal temperature of 100.4 F (38 C) or higher. MAKE SURE YOU:   Understand these instructions.  Will watch your condition.  Will get help right away if you are not doing well or get worse. Document Released: 03/18/2005 Document Revised: 08/31/2011 Document Reviewed: 08/14/2010 [facility] Patient Information 2013 [place].

## 2012-11-06 NOTE — Progress Notes (Signed)
[facility] [address] [place] [address] [phone]  Date:  11/06/2012   Name:  [name] [name]   DOB:  07/19/1975   MRN:  [id number]  PCP:  Adella Hare, MD    Chief Complaint: Sore Throat   History of Present Illness:  [name] [name] is a 37 y.o. very pleasant male patient who presents with the following:  Ill with nasal congestion and a clear nasal discharge.  Sore throat since Thursday . No fever or chills.  Painful swallowing  No cough, wheezing or shortness of breath.  No nausea or vomiting or stool change.  No rash.  No improvement with over the counter medications or other home remedies. Denies other complaint or health concern today.   Patient Active Problem List   Diagnosis Date Noted  . Exposure to Coccidioides immitis 11/19/2011  . Headache 10/07/2011  . CMV (cytomegalovirus infection) 03/03/2011  . Post herpetic neuralgia 01/28/2011  . Herpes zoster infection 01/05/2011  . Encounter for long-term (current) use of other medications 10/14/2010  . Immunosuppression 10/14/2010  . OBESITY, UNSPECIFIED 07/20/2007  . ANXIETY 07/20/2007  . DEPRESSION 07/20/2007  . OBSTRUCTIVE SLEEP APNEA 07/20/2007  . Essential hypertension, benign 07/20/2007  . CROHN'S DISEASE, LARGE AND SMALL INTESTINES 07/20/2007    Past Medical History  Diagnosis Date  . Obstructive sleep apnea (adult) (pediatric)   . Anxiety and depression   . Essential hypertension, benign   . Obesity, unspecified   . Herpes zoster infection 07/2010, 12/2010    with post-herpetic neuralgia  . Vitamin D deficiency   . Crohn's disease   . Hyperlipidemia   . CMV (cytomegalovirus infection) 10/28/2010  . Nephrolithiasis   . Fistula, perirectal     history of  . Sinus infection 08/05/11  . Gastritis   . Duodenitis     Past Surgical History  Procedure Laterality Date  . Small intestine surgery  08/05/07    1 ft removed, ileo-cecectomy  . Hypospadias correction      Childhood    . Upper gastrointestinal endoscopy  10/15/2010    w/biopsy, mild gastritis and duodenitis  . Colonoscopy  10/14/2007    crohn's colitis, aphthous ulcers, mild anal stenosis    History  Substance Use Topics  . Smoking status: Never Smoker   . Smokeless tobacco: Never Used  . Alcohol Use: No    Family History  Problem Relation Age of Onset  . Heart disease Father     CAD/MI  . Hyperlipidemia Father   . Hypertension Father   . Gout Father   . Sleep apnea Father   . Colon cancer Neg Hx   . Diabetes Neg Hx   . COPD Neg Hx   . Obesity Mother   . Allergies Mother   . Cancer Mother   . Stroke Maternal Grandfather   . Allergies Sister   . Asthma Sister     No Known Allergies  Medication list has been reviewed and updated.  Current Outpatient Prescriptions on File Prior to Visit  Medication Sig Dispense Refill  . acetaminophen (TYLENOL) 325 MG tablet Take 650 mg by mouth as needed. PAIN      . inFLIXimab (REMICADE) 100 MG injection Remicade 5 mg/kg IV q 6 weeks  1 each  3  . lisinopril-hydrochlorothiazide (PRINZIDE,ZESTORETIC) 20-25 MG per tablet Take 1 tablet by mouth daily.  30 tablet  11  . PARoxetine (PAXIL) 20 MG tablet Take 2 tablets (40 mg total) by mouth  every morning.  60 tablet  11  . diphenoxylate-atropine (LOMOTIL) 2.5-0.025 MG per tablet Take 1-2 po qID as needed for diarrhea  20 tablet  0  . HYDROcodone-acetaminophen (NORCO) 5-325 MG per tablet Take 1 tablet by mouth every 6 (six) hours as needed for pain.  30 tablet  0  . Probiotic Product (TRUBIOTICS PO) Take 1 capsule by mouth daily.       No current facility-administered medications on file prior to visit.    Review of Systems:  As per HPI, otherwise negative.    Physical Examination: Filed Vitals:   11/06/12 1139  BP: 110/90  Pulse: 72  Temp: 98.1 F (36.7 C)  Resp: 18   Filed Vitals:   11/06/12 1139  Height: 5' 7"  (1.702 m)  Weight: 221 lb (100.245 kg)   Body mass index is 34.61  kg/(m^2). Ideal Body Weight: Weight in (lb) to have BMI = 25: 159.3  GEN: WDWN, NAD, Non-toxic, A & O x 3 HEENT: Atraumatic, Normocephalic. Neck supple. No masses, No LAD.  Muffled voice.  Exudative tonsillitis Ears and Nose: No external deformity. CV: RRR, No M/G/R. No JVD. No thrill. No extra heart sounds. PULM: CTA B, no wheezes, crackles, rhonchi. No retractions. No resp. distress. No accessory muscle use. ABD: S, NT, ND, +BS. No rebound. No HSM. EXTR: No c/c/e NEURO Normal gait.  PSYCH: Normally interactive. Conversant. Not depressed or anxious appearing.  Calm demeanor.    Assessment and Plan: Tonsillitis Due to immunocompromise, will treat rather than rely on strep and CBC Follow up as needed Pen v k vicodin   Signed,  Ellison Carwin, MD

## 2012-11-09 ENCOUNTER — Encounter: Payer: Self-pay | Admitting: Internal Medicine

## 2012-11-09 ENCOUNTER — Other Ambulatory Visit: Payer: Managed Care, Other (non HMO)

## 2012-11-09 ENCOUNTER — Ambulatory Visit (INDEPENDENT_AMBULATORY_CARE_PROVIDER_SITE_OTHER): Payer: Managed Care, Other (non HMO) | Admitting: Internal Medicine

## 2012-11-09 VITALS — BP 122/82 | HR 61 | Temp 98.0°F | Ht 67.0 in | Wt 222.0 lb

## 2012-11-09 DIAGNOSIS — J029 Acute pharyngitis, unspecified: Secondary | ICD-10-CM

## 2012-11-09 DIAGNOSIS — Z2089 Contact with and (suspected) exposure to other communicable diseases: Secondary | ICD-10-CM

## 2012-11-09 DIAGNOSIS — Z202 Contact with and (suspected) exposure to infections with a predominantly sexual mode of transmission: Secondary | ICD-10-CM

## 2012-11-09 MED ORDER — PREDNISONE 10 MG PO TABS: ORAL_TABLET | ORAL | Status: DC

## 2012-11-09 MED ORDER — METHYLPREDNISOLONE ACETATE 80 MG/ML IJ SUSP
80.0000 mg | Freq: Once | INTRAMUSCULAR | Status: AC
  Administered 2012-11-09: 80 mg via INTRAMUSCULAR

## 2012-11-09 NOTE — Patient Instructions (Signed)
Viral and Bacterial Pharyngitis Pharyngitis is soreness (inflammation) or infection of the pharynx. It is also called a sore throat. CAUSES  Most sore throats are caused by viruses and are part of a cold. However, some sore throats are caused by strep and other bacteria. Sore throats can also be caused by post nasal drip from draining sinuses, allergies and sometimes from sleeping with an open mouth. Infectious sore throats can be spread from person to person by coughing, sneezing and sharing cups or eating utensils. TREATMENT  Sore throats that are viral usually last 3-4 days. Viral illness will get better without medications (antibiotics). Strep throat and other bacterial infections will usually begin to get better about 24-48 hours after you begin to take antibiotics. HOME CARE INSTRUCTIONS   If the caregiver feels there is a bacterial infection or if there is a positive strep test, they will prescribe an antibiotic. The full course of antibiotics must be taken. If the full course of antibiotic is not taken, you or your child may become ill again. If you or your child has strep throat and do not finish all of the medication, serious heart or kidney diseases may develop.  Drink enough water and fluids to keep your urine clear or pale yellow.  Only take over-the-counter or prescription medicines for pain, discomfort or fever as directed by your caregiver.  Get lots of rest.  Gargle with salt water ( tsp. of salt in a glass of water) as often as every 1-2 hours as you need for comfort.  Hard candies may soothe the throat if individual is not at risk for choking. Throat sprays or lozenges may also be used. SEEK MEDICAL CARE IF:   Large, tender lumps in the neck develop.  A rash develops.  Green, yellow-brown or bloody sputum is coughed up.  Your baby is older than 3 months with a rectal temperature of 100.5 F (38.1 C) or higher for more than 1 day. SEEK IMMEDIATE MEDICAL CARE IF:   A  stiff neck develops.  You or your child are drooling or unable to swallow liquids.  You or your child are vomiting, unable to keep medications or liquids down.  You or your child has severe pain, unrelieved with recommended medications.  You or your child are having difficulty breathing (not due to stuffy nose).  You or your child are unable to fully open your mouth.  You or your child develop redness, swelling, or severe pain anywhere on the neck.  You have a fever.  Your baby is older than 3 months with a rectal temperature of 102 F (38.9 C) or higher.  Your baby is 64 months old or younger with a rectal temperature of 100.4 F (38 C) or higher. MAKE SURE YOU:   Understand these instructions.  Will watch your condition.  Will get help right away if you are not doing well or get worse. Document Released: 06/08/2005 Document Revised: 08/31/2011 Document Reviewed: 09/05/2007 [facility] Patient Information 2014 [place], [place].

## 2012-11-09 NOTE — Addendum Note (Signed)
Addended by: Legrand Como on: 11/09/2012 04:22 PM   Modules accepted: Orders

## 2012-11-09 NOTE — Progress Notes (Signed)
HPI  Pt presents to the clinic today with c/o sore throat. He went to [facility] on Sunday for the same. They did not do a throat culture or RST. He was given penicillin but symptoms are unchanged. He is having a hard time swallowing. He does snore on a routine basis. He is not sure why is throat is so swollen. He really feels like the antibiotics are not helping. Additionally, he got a phone call from the health department stating the he has been with a partner who may have syphilis. He has not symptoms but he would like testing today.  Review of Systems      Past Medical History  Diagnosis Date  . Obstructive sleep apnea (adult) (pediatric)   . Anxiety and depression   . Essential hypertension, benign   . Obesity, unspecified   . Herpes zoster infection 07/2010, 12/2010    with post-herpetic neuralgia  . Vitamin D deficiency   . Crohn's disease   . Hyperlipidemia   . CMV (cytomegalovirus infection) 10/28/2010  . Nephrolithiasis   . Fistula, perirectal     history of  . Sinus infection 08/05/11  . Gastritis   . Duodenitis     Family History  Problem Relation Age of Onset  . Heart disease Father     CAD/MI  . Hyperlipidemia Father   . Hypertension Father   . Gout Father   . Sleep apnea Father   . Colon cancer Neg Hx   . Diabetes Neg Hx   . COPD Neg Hx   . Obesity Mother   . Allergies Mother   . Cancer Mother   . Stroke Maternal Grandfather   . Allergies Sister   . Asthma Sister     History   Social History  . Marital Status: Legally Separated    Spouse Name: N/A    Number of Children: 2  . 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 Smoking status: Never Smoker   . Smokeless tobacco: Never Used  . Alcohol Use: No  . Drug Use: No  . Sexually Active: Not Currently -- Male partner(s)   Other Topics Concern  . Not on file   Social History Narrative   HSG, [employer] - 2 years.  Married '99 - 55yr/divorced. 2 sons - '98, '00 split custody. 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 No history of physical or sexual abuse.     No Known Allergies   Constitutional:  Denies headache, fatigue, fever or abrupt weight changes.  HEENT:  Positive sore throat. Denies eye redness, eye pain, pressure behind the eyes, facial pain, nasal congestion, ear pain, ringing in the ears, wax buildup, runny nose or bloody nose. Respiratory: . Denies cough, difficulty breathing or shortness of breath.  Cardiovascular: Denies chest pain, chest tightness, palpitations or swelling in the hands or feet.   No other specific complaints in a complete review of systems (except as listed in HPI above).  Objective:   BP 122/82  Pulse 61  Temp(Src) 98 F (36.7 C) (Oral)  Ht 5' 7"  (1.702 m)  Wt 222 lb (100.699 kg)  BMI 34.76 kg/m2  SpO2 95% Wt Readings from Last 3 Encounters:  11/09/12 222 lb (100.699 kg)  11/06/12 221 lb (100.245 kg)  10/21/12 224 lb 4 oz (101.719 kg)     General: Appears his stated age, well developed, well nourished  in NAD. HEENT: Head: normal shape and size; Eyes: sclera white, no icterus, conjunctiva pink, PERRLA and EOMs intact; Ears: Tm's gray and intact, normal light reflex; Nose: mucosa pink and moist, septum midline; Throat/Mouth: + PND. Teeth present, mucosa erythematous and moist 3+ tonsils bilaterally, no exudate noted, no lesions or ulcerations noted.  Neck: Mild tonsillar lymphadenopathy. Neck supple, trachea midline. No massses, lumps or thyromegaly present.  Cardiovascular: Normal rate and rhythm. S1,S2 noted.  No murmur, rubs or gallops noted. No JVD or BLE edema. No carotid bruits noted. Pulmonary/Chest: Normal effort and positive vesicular breath sounds. No respiratory distress. No wheezes, rales or ronchi noted.      Assessment & Plan:   Acute pharyngitis, unresolved:  Get some rest and drink plenty of water Do salt water gargles for the  sore throat eRx for pred taper 80 mg IM today  Exposure to STD:  Will perform STD panel today Encouraged pt on the use of safe sex practices  RTC as needed or if symptoms persist.

## 2012-11-10 LAB — SYPHILIS: RPR W/REFLEX TO RPR TITER AND TREPONEMAL ANTIBODIES, TRADITIONAL SCREENING AND DIAGNOSIS ALGORITHM

## 2012-11-10 LAB — HEPATITIS C RNA QUANTITATIVE: HCV Quantitative: NOT DETECTED IU/mL (ref <15)

## 2012-11-10 LAB — HIV ANTIBODY (ROUTINE TESTING W REFLEX): HIV: NONREACTIVE

## 2012-11-23 ENCOUNTER — Ambulatory Visit: Payer: Managed Care, Other (non HMO)

## 2012-11-23 ENCOUNTER — Ambulatory Visit (INDEPENDENT_AMBULATORY_CARE_PROVIDER_SITE_OTHER): Payer: Managed Care, Other (non HMO) | Admitting: Internal Medicine

## 2012-11-23 ENCOUNTER — Encounter: Payer: Self-pay | Admitting: Internal Medicine

## 2012-11-23 VITALS — BP 132/86 | HR 78 | Temp 97.8°F | Ht 67.0 in | Wt 225.0 lb

## 2012-11-23 DIAGNOSIS — J029 Acute pharyngitis, unspecified: Secondary | ICD-10-CM

## 2012-11-23 DIAGNOSIS — J351 Hypertrophy of tonsils: Secondary | ICD-10-CM

## 2012-11-23 LAB — POCT RAPID STREP A (OFFICE): Rapid Strep A Screen: NEGATIVE

## 2012-11-23 MED ORDER — CEFTRIAXONE SODIUM 500 MG IJ SOLR
250.0000 mg | Freq: Once | INTRAMUSCULAR | Status: AC
  Administered 2012-11-23: 250 mg via INTRAMUSCULAR

## 2012-11-23 MED ORDER — HYDROCODONE-ACETAMINOPHEN 5-325 MG PO TABS: 1.0000 | ORAL_TABLET | ORAL | Status: DC | PRN

## 2012-11-23 MED ORDER — AZITHROMYCIN 500 MG PO TABS: 500.0000 mg | ORAL_TABLET | Freq: Every day | ORAL | Status: DC

## 2012-11-23 NOTE — Progress Notes (Signed)
HPI  Pt presents to the clinic today with c/o sore throat.  This started about 2 weeks ago. He is having a hard time swallowing.  He is not sure why is throat is so swollen. He was recently treated for strep throat. He does have very large tonsils. He is requesting referral to ENT for further evaluation. Of note, his partner has also been having similar symptoms. He was recently diagnosed with gonorrhea of the throat.  Review of Systems      Past Medical History  Diagnosis Date  . Obstructive sleep apnea (adult) (pediatric)   . Anxiety and depression   . Essential hypertension, benign   . Obesity, unspecified   . Herpes zoster infection 07/2010, 12/2010    with post-herpetic neuralgia  . Vitamin D deficiency   . Crohn's disease   . Hyperlipidemia   . CMV (cytomegalovirus infection) 10/28/2010  . Nephrolithiasis   . Fistula, perirectal     history of  . Sinus infection 08/05/11  . Gastritis   . Duodenitis     Family History  Problem Relation Age of Onset  . Heart disease Father     CAD/MI  . Hyperlipidemia Father   . Hypertension Father   . Gout Father   . Sleep apnea Father   . Colon cancer Neg Hx   . Diabetes Neg Hx   . COPD Neg Hx   . Obesity Mother   . Allergies Mother   . Cancer Mother   . Stroke Maternal Grandfather   . Allergies Sister   . Asthma Sister     History   Social History  . Marital Status: Legally Separated    Spouse Name: N/A    Number of Children: 2  . 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 Smoking status: Never Smoker   . Smokeless tobacco: Never Used  . Alcohol Use: No  . Drug Use: No  . Sexually Active: Not Currently -- Male partner(s)   Other Topics Concern  . Not on file   Social History Narrative   HSG, [employer] - 2 years. Married '99 - 78yr/divorced. 2 sons - '98, '00 split custody. 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 No  history of physical or sexual abuse.     No Known Allergies   Constitutional:  Denies headache, fatigue, fever or abrupt weight changes.  HEENT:  Positive sore throat. Denies eye redness, eye pain, pressure behind the eyes, facial pain, nasal congestion, ear pain, ringing in the ears, wax buildup, runny nose or bloody nose. Respiratory: . Denies cough, difficulty breathing or shortness of breath.  Cardiovascular: Denies chest pain, chest tightness, palpitations or swelling in the hands or feet.   No other specific complaints in a complete review of systems (except as listed in HPI above).  Objective:    Wt Readings from Last 3 Encounters:  11/09/12 222 lb (100.699 kg)  11/06/12 221 lb (100.245 kg)  10/21/12 224 lb 4 oz (101.719 kg)     General: Appears his stated age, well developed, well nourished in NAD. HEENT: Head: normal shape and size; Eyes: sclera white, no icterus, conjunctiva pink, PERRLA and EOMs intact; Ears: Tm's gray and intact, normal light reflex; Nose: mucosa pink and moist, septum midline; Throat/Mouth: + PND. Teeth present, mucosa erythematous and moist 3+ tonsils bilaterally, no exudate noted, no lesions or ulcerations noted.  Neck: Mild tonsillar lymphadenopathy. Neck supple, trachea midline. No massses, lumps or thyromegaly present.  Cardiovascular: Normal rate and rhythm. S1,S2 noted.  No murmur, rubs or gallops noted. No JVD or BLE edema. No carotid bruits noted. Pulmonary/Chest: Normal effort and positive vesicular breath sounds. No respiratory distress. No wheezes, rales or ronchi noted.      Assessment & Plan:   Acute pharyngitis, unresolved:  Get some rest and drink plenty of water Do salt water gargles for the sore throat Will test for gonorrhea of the throat Will treat with azithromycin 2 gm and 250 of Rocephin IM today Will refer to ENT for tonsil removal

## 2012-11-23 NOTE — Addendum Note (Signed)
Addended by: Legrand Como on: 11/23/2012 04:34 PM   Modules accepted: Orders

## 2012-11-23 NOTE — Addendum Note (Signed)
Addended by: Legrand Como on: 11/23/2012 04:57 PM   Modules accepted: Orders

## 2012-11-23 NOTE — Addendum Note (Signed)
Addended by: Jearld Fenton on: 11/23/2012 03:55 PM   Modules accepted: Orders

## 2012-11-23 NOTE — Patient Instructions (Signed)
Viral and Bacterial Pharyngitis Pharyngitis is soreness (inflammation) or infection of the pharynx. It is also called a sore throat. CAUSES  Most sore throats are caused by viruses and are part of a cold. However, some sore throats are caused by strep and other bacteria. Sore throats can also be caused by post nasal drip from draining sinuses, allergies and sometimes from sleeping with an open mouth. Infectious sore throats can be spread from person to person by coughing, sneezing and sharing cups or eating utensils. TREATMENT  Sore throats that are viral usually last 3-4 days. Viral illness will get better without medications (antibiotics). Strep throat and other bacterial infections will usually begin to get better about 24-48 hours after you begin to take antibiotics. HOME CARE INSTRUCTIONS   If the caregiver feels there is a bacterial infection or if there is a positive strep test, they will prescribe an antibiotic. The full course of antibiotics must be taken. If the full course of antibiotic is not taken, you or your child may become ill again. If you or your child has strep throat and do not finish all of the medication, serious heart or kidney diseases may develop.  Drink enough water and fluids to keep your urine clear or pale yellow.  Only take over-the-counter or prescription medicines for pain, discomfort or fever as directed by your caregiver.  Get lots of rest.  Gargle with salt water ( tsp. of salt in a glass of water) as often as every 1-2 hours as you need for comfort.  Hard candies may soothe the throat if individual is not at risk for choking. Throat sprays or lozenges may also be used. SEEK MEDICAL CARE IF:   Large, tender lumps in the neck develop.  A rash develops.  Green, yellow-brown or bloody sputum is coughed up.  Your baby is older than 3 months with a rectal temperature of 100.5 F (38.1 C) or higher for more than 1 day. SEEK IMMEDIATE MEDICAL CARE IF:   A  stiff neck develops.  You or your child are drooling or unable to swallow liquids.  You or your child are vomiting, unable to keep medications or liquids down.  You or your child has severe pain, unrelieved with recommended medications.  You or your child are having difficulty breathing (not due to stuffy nose).  You or your child are unable to fully open your mouth.  You or your child develop redness, swelling, or severe pain anywhere on the neck.  You have a fever.  Your baby is older than 3 months with a rectal temperature of 102 F (38.9 C) or higher.  Your baby is 48 months old or younger with a rectal temperature of 100.4 F (38 C) or higher. MAKE SURE YOU:   Understand these instructions.  Will watch your condition.  Will get help right away if you are not doing well or get worse. Document Released: 06/08/2005 Document Revised: 08/31/2011 Document Reviewed: 09/05/2007 [facility] Patient Information 2014 [place], [place].

## 2012-11-26 LAB — GONOCOCCUS CULTURE: Organism ID, Bacteria: NO GROWTH

## 2012-12-07 ENCOUNTER — Telehealth: Payer: Self-pay | Admitting: Internal Medicine

## 2012-12-07 NOTE — Telephone Encounter (Signed)
Letter provided

## 2012-12-15 ENCOUNTER — Telehealth: Payer: Self-pay

## 2012-12-15 ENCOUNTER — Ambulatory Visit (INDEPENDENT_AMBULATORY_CARE_PROVIDER_SITE_OTHER): Payer: Managed Care, Other (non HMO) | Admitting: Internal Medicine

## 2012-12-15 ENCOUNTER — Encounter (HOSPITAL_COMMUNITY)
Admission: RE | Admit: 2012-12-15 | Payer: BC Managed Care – PPO | Source: Ambulatory Visit | Attending: Internal Medicine | Admitting: Internal Medicine

## 2012-12-15 ENCOUNTER — Encounter: Payer: Self-pay | Admitting: Internal Medicine

## 2012-12-15 VITALS — BP 134/92 | HR 93 | Temp 98.5°F | Ht 67.0 in | Wt 222.0 lb

## 2012-12-15 DIAGNOSIS — G4700 Insomnia, unspecified: Secondary | ICD-10-CM

## 2012-12-15 DIAGNOSIS — F4320 Adjustment disorder, unspecified: Secondary | ICD-10-CM

## 2012-12-15 DIAGNOSIS — Z658 Other specified problems related to psychosocial circumstances: Secondary | ICD-10-CM

## 2012-12-15 DIAGNOSIS — F411 Generalized anxiety disorder: Secondary | ICD-10-CM

## 2012-12-15 DIAGNOSIS — F4329 Adjustment disorder with other symptoms: Secondary | ICD-10-CM

## 2012-12-15 DIAGNOSIS — K50919 Crohn's disease, unspecified, with unspecified complications: Secondary | ICD-10-CM

## 2012-12-15 MED ORDER — ZOLPIDEM TARTRATE 5 MG PO TABS: 5.0000 mg | ORAL_TABLET | Freq: Every evening | ORAL | Status: DC | PRN

## 2012-12-15 MED ORDER — ALPRAZOLAM 0.5 MG PO TABS: 0.5000 mg | ORAL_TABLET | Freq: Three times a day (TID) | ORAL | Status: DC | PRN

## 2012-12-15 NOTE — Telephone Encounter (Signed)
Patient has a new insurance in the system and needs Remicade to have pre-cert.
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
## 2012-12-15 NOTE — Patient Instructions (Signed)

## 2012-12-16 ENCOUNTER — Emergency Department (INDEPENDENT_AMBULATORY_CARE_PROVIDER_SITE_OTHER)
Admission: EM | Admit: 2012-12-16 | Discharge: 2012-12-16 | Disposition: A | Discharge status: Home / Self Care | Payer: Managed Care, Other (non HMO) | Source: Home / Self Care | Attending: Emergency Medicine | Admitting: Emergency Medicine

## 2012-12-16 ENCOUNTER — Encounter: Payer: Self-pay | Admitting: Internal Medicine

## 2012-12-16 ENCOUNTER — Encounter (HOSPITAL_COMMUNITY): Payer: Self-pay | Admitting: *Deleted

## 2012-12-16 ENCOUNTER — Emergency Department (INDEPENDENT_AMBULATORY_CARE_PROVIDER_SITE_OTHER): Payer: Managed Care, Other (non HMO)

## 2012-12-16 DIAGNOSIS — J069 Acute upper respiratory infection, unspecified: Secondary | ICD-10-CM

## 2012-12-16 DIAGNOSIS — J40 Bronchitis, not specified as acute or chronic: Secondary | ICD-10-CM

## 2012-12-16 LAB — CBC WITH DIFFERENTIAL/PLATELET
Basophils Absolute: 0 10*3/uL (ref 0.0–0.1)
Basophils Relative: 1 % (ref 0–1)
Eosinophils Absolute: 0.1 10*3/uL (ref 0.0–0.7)
MCH: 32 pg (ref 26.0–34.0)
MCHC: 34.9 g/dL (ref 30.0–36.0)
Monocytes Absolute: 0.9 10*3/uL (ref 0.1–1.0)
Monocytes Relative: 13 % — ABNORMAL HIGH (ref 3–12)
Neutro Abs: 4.1 10*3/uL (ref 1.7–7.7)
Neutrophils Relative %: 58 % (ref 43–77)
RDW: 14.3 % (ref 11.5–15.5)

## 2012-12-16 MED ORDER — HYDROCOD POLST-CHLORPHEN POLST 10-8 MG/5ML PO LQCR: 5.0000 mL | Freq: Two times a day (BID) | ORAL | Status: DC | PRN

## 2012-12-16 MED ORDER — ALBUTEROL SULFATE (5 MG/ML) 0.5% IN NEBU
INHALATION_SOLUTION | RESPIRATORY_TRACT | Status: AC
  Filled 2012-12-16: qty 1

## 2012-12-16 MED ORDER — FEXOFENADINE-PSEUDOEPHED ER 60-120 MG PO TB12: 1.0000 | ORAL_TABLET | Freq: Two times a day (BID) | ORAL | Status: DC

## 2012-12-16 MED ORDER — ALBUTEROL SULFATE (5 MG/ML) 0.5% IN NEBU
2.5000 mg | INHALATION_SOLUTION | Freq: Once | RESPIRATORY_TRACT | Status: AC
  Administered 2012-12-16: 2.5 mg via RESPIRATORY_TRACT

## 2012-12-16 NOTE — ED Notes (Signed)
Chart review.

## 2012-12-16 NOTE — ED Notes (Signed)
Pt  Reports  Symptoms    Of  Symptoms  Of      Body  Aches   Cough    Congestion        Since  Yesterday  Pt  Reports       He  Had  A  High  Fever  Earlier  Today   Pt  Also  Reports  He  Has  crohns  Disease      States  He   Recently  Has       Had   Strep infections  But  denys  sorethroat at this  Time

## 2012-12-16 NOTE — ED Provider Notes (Signed)
History    CSN: [id number] Arrival date & time 12/16/12  1454  First MD Initiated Contact with Patient 12/16/12 1612     Chief Complaint  Patient presents with  . URI   (Consider location/radiation/quality/duration/timing/severity/associated sxs/prior Treatment) HPI  37 yo wm presents today with cough, congestion, fever, body/joint aches since yesterday.  Temp yesterday 101.5.  Has had some sore throat but not bad.  Denies pain with swallowing or dysphagia.  C/o clear nasal drainage.  Was treated for throat infection x 2 by his family NP.  ? Strep and gonococcal infection, although cultures were negative. States that he just doesn't feel well.  No complaints of chest pain.  Some dyspnea.       Past Medical History  Diagnosis Date  . Obstructive sleep apnea (adult) (pediatric)   . Anxiety and depression   . Essential hypertension, benign   . Obesity, unspecified   . Herpes zoster infection 07/2010, 12/2010    with post-herpetic neuralgia  . Vitamin D deficiency   . Crohn's disease   . Hyperlipidemia   . CMV (cytomegalovirus infection) 10/28/2010  . Nephrolithiasis   . Fistula, perirectal     history of  . Sinus infection 08/05/11  . Gastritis   . Duodenitis    Past Surgical History  Procedure Laterality Date  . Small intestine surgery  08/05/07    1 ft removed, ileo-cecectomy  . Hypospadias correction      Childhood   . Upper gastrointestinal endoscopy  10/15/2010    w/biopsy, mild gastritis and duodenitis  . Colonoscopy  10/14/2007    crohn's colitis, aphthous ulcers, mild anal stenosis   Family History  Problem Relation Age of Onset  . Heart disease Father     CAD/MI  . Hyperlipidemia Father   . Hypertension Father   . Gout Father   . Sleep apnea Father   . Colon cancer Neg Hx   . Diabetes Neg Hx   . COPD Neg Hx   . Obesity Mother   . Allergies Mother   . Cancer Mother   . Stroke Maternal Grandfather   . Allergies Sister   . Asthma Sister    History  Substance  Use Topics  . Smoking status: Never Smoker   . Smokeless tobacco: Never Used  . Alcohol Use: No    Review of Systems  Constitutional: Positive for fever, chills and fatigue.  HENT: Positive for congestion and rhinorrhea (clear). Negative for hearing loss, neck stiffness and ear discharge.   Eyes: Positive for discharge (clear, watery).  Respiratory: Positive for cough and shortness of breath.   Cardiovascular: Negative.   Gastrointestinal: Positive for abdominal pain (hx crohn's). Negative for nausea.  Genitourinary: Negative.   Musculoskeletal: Positive for myalgias and arthralgias.  Skin: Negative.   Neurological: Negative.   Psychiatric/Behavioral: Negative.     Allergies  Review of patient's allergies indicates no known allergies.  Home Medications   Current Outpatient Rx  Name  Route  Sig  Dispense  Refill  . acetaminophen (TYLENOL) 325 MG tablet   Oral   Take 650 mg by mouth as needed. PAIN         . ALPRAZolam (XANAX) 0.5 MG tablet   Oral   Take 1 tablet (0.5 mg total) by mouth 3 (three) times daily as needed for sleep.   30 tablet   0   . diphenoxylate-atropine (LOMOTIL) 2.5-0.025 MG per tablet      Take 1-2 po qID as needed for  diarrhea   20 tablet   0   . inFLIXimab (REMICADE) 100 MG injection      Remicade 5 mg/kg IV q 6 weeks   1 each   3   . lisinopril-hydrochlorothiazide (PRINZIDE,ZESTORETIC) 20-25 MG per tablet   Oral   Take 1 tablet by mouth daily.   30 tablet   11   . PARoxetine (PAXIL) 20 MG tablet   Oral   Take 2 tablets (40 mg total) by mouth every morning.   60 tablet   11   . Probiotic Product (TRUBIOTICS PO)   Oral   Take 1 capsule by mouth daily.         Marland Kitchen zolpidem (AMBIEN) 5 MG tablet   Oral   Take 1 tablet (5 mg total) by mouth at bedtime as needed for sleep.   15 tablet   1    BP 122/80  Pulse 118  Temp(Src) 99.6 F (37.6 C) (Oral)  Resp 18  SpO2 98% Physical Exam  Constitutional: He is oriented to person,  place, and time. He appears well-developed and well-nourished.  HENT:  Head: Normocephalic and atraumatic.  Right Ear: External ear normal.  Left Ear: External ear normal.  Nose: Nose normal.  Throat somewhat red.    Eyes: EOM are normal. Pupils are equal, round, and reactive to light.  Neck: Normal range of motion.  Cardiovascular: Regular rhythm and normal heart sounds.   Tachycardic   Pulmonary/Chest: He has wheezes (left). He exhibits no tenderness.  Few scattered rhonchi  Musculoskeletal: Normal range of motion.  Lymphadenopathy:    He has cervical adenopathy (bilateral).  Neurological: He is alert and oriented to person, place, and time.  Skin: Skin is warm and dry.  Psychiatric: He has a normal mood and affect.    ED Course  Procedures (including critical care time) Labs Reviewed  CBC WITH DIFFERENTIAL   No results found. No diagnosis found.  Assessment:  Viral uri and bronchitis  MDM  Will f/u with PCP or [place] in 3-5 days if symptoms not improved or worsen.  Feels better after albuterol neb treatment.  Plan discussed and he voices understanding.  Can continue tylenol prn for fever.  Cannot use anti-inflammatory medication due to hx of crohn's disease.    Meds ordered this encounter  Medications  . albuterol (PROVENTIL) (5 MG/ML) 0.5% nebulizer solution 2.5 mg    Sig:   . fexofenadine-pseudoephedrine (ALLEGRA-D) 60-120 MG per tablet    Sig: Take 1 tablet by mouth every 12 (twelve) hours.    Dispense:  30 tablet    Refill:  0  . chlorpheniramine-HYDROcodone (TUSSIONEX PENNKINETIC ER) 10-8 MG/5ML LQCR    Sig: Take 5 mLs by mouth every 12 (twelve) hours as needed.    Dispense:  140 mL    Refill:  0    Benjiman Core, [name] 12/16/12 [place], [name] 12/16/12 1844

## 2012-12-16 NOTE — ED Provider Notes (Signed)
Medical screening examination/treatment/procedure(s) were performed by non-physician practitioner and as supervising physician I was immediately available for consultation/collaboration.  Philipp Deputy, M.D.  Harden Mo, MD 12/16/12 [phone]

## 2012-12-21 ENCOUNTER — Encounter (HOSPITAL_COMMUNITY)
Admission: RE | Admit: 2012-12-21 | Payer: Managed Care, Other (non HMO) | Source: Ambulatory Visit | Attending: Internal Medicine | Admitting: Internal Medicine

## 2012-12-21 ENCOUNTER — Telehealth: Payer: Self-pay | Admitting: Internal Medicine

## 2012-12-21 ENCOUNTER — Telehealth: Payer: Self-pay

## 2012-12-21 DIAGNOSIS — F411 Generalized anxiety disorder: Secondary | ICD-10-CM

## 2012-12-21 DIAGNOSIS — G4700 Insomnia, unspecified: Secondary | ICD-10-CM

## 2012-12-21 DIAGNOSIS — F4329 Adjustment disorder with other symptoms: Secondary | ICD-10-CM

## 2012-12-21 NOTE — Telephone Encounter (Signed)
Patient notified.  He is asked to call and reschedule with short stay

## 2012-12-21 NOTE — Telephone Encounter (Signed)
Patient is scheduled for Remicade today.  He has chest and head congestion with cough.  He had fever on Friday but none since.  Is it ok for Remicade today?

## 2012-12-21 NOTE — Telephone Encounter (Signed)
Reschedule to next week

## 2012-12-21 NOTE — Telephone Encounter (Signed)
Ambien is working fine.  The Xanax .59m is not working.  He wants to know what else to try.

## 2012-12-21 NOTE — Telephone Encounter (Signed)
Several visits to NP noted. 1. Ok to refill ambien and stop xanax 2 I recommend short term counseling for post-traumatic stress syndrome. If he would like we can make referral.

## 2012-12-22 MED ORDER — BUPROPION HCL ER (XL) 150 MG PO TB24: 150.0000 mg | ORAL_TABLET | Freq: Every day | ORAL | Status: DC

## 2012-12-22 MED ORDER — ZOLPIDEM TARTRATE 5 MG PO TABS: 5.0000 mg | ORAL_TABLET | Freq: Every evening | ORAL | Status: DC | PRN

## 2012-12-22 NOTE — Telephone Encounter (Signed)
Patient returned call. He states he wants the Ambien called to [facility] on [address], and this has been done for qty 15 plus 1 refill as prescribed before. He asks what can be prescribed in place of the xanax for his anxiety?? Lastly he states his work has an Forensic psychologist and they are helping him with the counseling setting him up to see someone. Please advise on the anxiety medication.

## 2012-12-22 NOTE — Telephone Encounter (Signed)
1. Good that he is seeing someone about post-traumatic stress 2. OK to use ambien nightly - ,may increase # to 30 3. For anxiety - trial of Wellbutrin XL 150 mg 1 every AM, #30, 3 refills. Will need office follow up in 3 weeks.

## 2012-12-22 NOTE — Telephone Encounter (Signed)
Phone call to patient letting him know Wellbutrin XL 150 mg was called to [facility] on [address] and f/u in the office in 3 weeks. He was transferred to the front desk to schedule an appointment. While talking with the pharmacy at [place] he already had a script for Ambien #15 filled on 12/15/12 and there is a 1 refill. So I cancelled the one I called in today.

## 2012-12-22 NOTE — Telephone Encounter (Signed)
Left message for patient to return call. He needs to be notified to stop xanax. Need to know where to call in his Ambien. And to notify him about suggested counseling.

## 2012-12-28 ENCOUNTER — Encounter (HOSPITAL_COMMUNITY)
Admission: RE | Admit: 2012-12-28 | Discharge: 2012-12-28 | Disposition: A | Discharge status: Home / Self Care | Payer: Managed Care, Other (non HMO) | Source: Ambulatory Visit | Attending: Internal Medicine | Admitting: Internal Medicine

## 2012-12-28 ENCOUNTER — Encounter (HOSPITAL_COMMUNITY): Payer: Self-pay

## 2012-12-28 VITALS — BP 108/72 | HR 67 | Temp 97.0°F | Resp 20 | Wt 222.4 lb

## 2012-12-28 DIAGNOSIS — K5090 Crohn's disease, unspecified, without complications: Secondary | ICD-10-CM | POA: Insufficient documentation

## 2012-12-28 MED ORDER — DIPHENHYDRAMINE HCL 25 MG PO CAPS
50.0000 mg | ORAL_CAPSULE | Freq: Every day | ORAL | Status: DC
  Administered 2012-12-28: 50 mg via ORAL
  Filled 2012-12-28: qty 2

## 2012-12-28 MED ORDER — SODIUM CHLORIDE 0.9 % IV SOLN
5.0000 mg/kg | INTRAVENOUS | Status: DC
  Administered 2012-12-28: 500 mg via INTRAVENOUS
  Filled 2012-12-28: qty 50

## 2012-12-28 MED ORDER — SODIUM CHLORIDE 0.9 % IV SOLN
INTRAVENOUS | Status: DC
  Administered 2012-12-28: 15:00:00 via INTRAVENOUS

## 2012-12-28 MED ORDER — DIPHENHYDRAMINE HCL 25 MG PO TABS: 50.0000 mg | ORAL_TABLET | Freq: Every day | ORAL | Status: DC

## 2012-12-28 MED ORDER — ACETAMINOPHEN 325 MG PO TABS
650.0000 mg | ORAL_TABLET | Freq: Every day | ORAL | Status: DC
  Administered 2012-12-28: 650 mg via ORAL
  Filled 2012-12-28: qty 2

## 2013-01-09 ENCOUNTER — Emergency Department (HOSPITAL_COMMUNITY)
Admission: EM | Admit: 2013-01-09 | Discharge: 2013-01-09 | Disposition: A | Discharge status: Home / Self Care | Payer: Managed Care, Other (non HMO) | Attending: Emergency Medicine | Admitting: Emergency Medicine

## 2013-01-09 DIAGNOSIS — Z8669 Personal history of other diseases of the nervous system and sense organs: Secondary | ICD-10-CM | POA: Insufficient documentation

## 2013-01-09 DIAGNOSIS — Z792 Long term (current) use of antibiotics: Secondary | ICD-10-CM | POA: Insufficient documentation

## 2013-01-09 DIAGNOSIS — I10 Essential (primary) hypertension: Secondary | ICD-10-CM | POA: Insufficient documentation

## 2013-01-09 DIAGNOSIS — K5090 Crohn's disease, unspecified, without complications: Secondary | ICD-10-CM | POA: Insufficient documentation

## 2013-01-09 DIAGNOSIS — F341 Dysthymic disorder: Secondary | ICD-10-CM | POA: Insufficient documentation

## 2013-01-09 DIAGNOSIS — K089 Disorder of teeth and supporting structures, unspecified: Secondary | ICD-10-CM | POA: Insufficient documentation

## 2013-01-09 DIAGNOSIS — E785 Hyperlipidemia, unspecified: Secondary | ICD-10-CM | POA: Insufficient documentation

## 2013-01-09 DIAGNOSIS — Z862 Personal history of diseases of the blood and blood-forming organs and certain disorders involving the immune mechanism: Secondary | ICD-10-CM | POA: Insufficient documentation

## 2013-01-09 DIAGNOSIS — Z8719 Personal history of other diseases of the digestive system: Secondary | ICD-10-CM | POA: Insufficient documentation

## 2013-01-09 DIAGNOSIS — Z87442 Personal history of urinary calculi: Secondary | ICD-10-CM | POA: Insufficient documentation

## 2013-01-09 DIAGNOSIS — Z8639 Personal history of other endocrine, nutritional and metabolic disease: Secondary | ICD-10-CM | POA: Insufficient documentation

## 2013-01-09 DIAGNOSIS — Z8709 Personal history of other diseases of the respiratory system: Secondary | ICD-10-CM | POA: Insufficient documentation

## 2013-01-09 DIAGNOSIS — K0889 Other specified disorders of teeth and supporting structures: Secondary | ICD-10-CM

## 2013-01-09 DIAGNOSIS — Z8619 Personal history of other infectious and parasitic diseases: Secondary | ICD-10-CM | POA: Insufficient documentation

## 2013-01-09 DIAGNOSIS — E669 Obesity, unspecified: Secondary | ICD-10-CM | POA: Insufficient documentation

## 2013-01-09 MED ORDER — PENICILLIN V POTASSIUM 250 MG PO TABS
500.0000 mg | ORAL_TABLET | Freq: Once | ORAL | Status: AC
  Administered 2013-01-09: 500 mg via ORAL
  Filled 2013-01-09: qty 1

## 2013-01-09 MED ORDER — HYDROCODONE-ACETAMINOPHEN 5-325 MG PO TABS
1.0000 | ORAL_TABLET | Freq: Once | ORAL | Status: AC
  Administered 2013-01-09: 1 via ORAL
  Filled 2013-01-09: qty 1

## 2013-01-09 MED ORDER — PENICILLIN V POTASSIUM 500 MG PO TABS: 500.0000 mg | ORAL_TABLET | Freq: Four times a day (QID) | ORAL | Status: DC

## 2013-01-09 MED ORDER — HYDROCODONE-ACETAMINOPHEN 5-325 MG PO TABS: 1.0000 | ORAL_TABLET | Freq: Four times a day (QID) | ORAL | Status: DC | PRN

## 2013-01-09 NOTE — ED Provider Notes (Signed)
History    CSN: [id number] Arrival date & time 01/09/13  0101  None    Chief Complaint  Patient presents with  . Facial Pain   (Consider location/radiation/quality/duration/timing/severity/associated sxs/prior Treatment) HPI Comments: Patient with [place] cavity in the right lower second molar .  There having increased pain, and facial sensitivity.  Minimal swelling, noted  The history is provided by the patient.   Past Medical History  Diagnosis Date  . Obstructive sleep apnea (adult) (pediatric)   . Anxiety and depression   . Essential hypertension, benign   . Obesity, unspecified   . Herpes zoster infection 07/2010, 12/2010    with post-herpetic neuralgia  . Vitamin D deficiency   . Crohn's disease   . Hyperlipidemia   . CMV (cytomegalovirus infection) 10/28/2010  . Nephrolithiasis   . Fistula, perirectal     history of  . Sinus infection 08/05/11  . Gastritis   . Duodenitis    Past Surgical History  Procedure Laterality Date  . Small intestine surgery  08/05/07    1 ft removed, ileo-cecectomy  . Hypospadias correction      Childhood   . Upper gastrointestinal endoscopy  10/15/2010    w/biopsy, mild gastritis and duodenitis  . Colonoscopy  10/14/2007    crohn's colitis, aphthous ulcers, mild anal stenosis   Family History  Problem Relation Age of Onset  . Heart disease Father     CAD/MI  . Hyperlipidemia Father   . Hypertension Father   . Gout Father   . Sleep apnea Father   . Colon cancer Neg Hx   . Diabetes Neg Hx   . COPD Neg Hx   . Obesity Mother   . Allergies Mother   . Cancer Mother   . Stroke Maternal Grandfather   . Allergies Sister   . Asthma Sister    History  Substance Use Topics  . Smoking status: Never Smoker   . Smokeless tobacco: Never Used  . Alcohol Use: No    Review of Systems  Constitutional: Negative for fever and chills.  HENT: Negative for ear pain, sore throat and trouble swallowing.   Neurological: Negative for dizziness  and headaches.  All other systems reviewed and are negative.    Allergies  Review of patient's allergies indicates no known allergies.  Home Medications   Current Outpatient Rx  Name  Route  Sig  Dispense  Refill  . acetaminophen (TYLENOL) 325 MG tablet   Oral   Take 650 mg by mouth every 6 (six) hours as needed for pain. PAIN         . ALPRAZolam (XANAX) 0.5 MG tablet   Oral   Take 1 tablet (0.5 mg total) by mouth 3 (three) times daily as needed for sleep.   30 tablet   0   . buPROPion (WELLBUTRIN XL) 150 MG 24 hr tablet   Oral   Take 1 tablet (150 mg total) by mouth daily.   30 tablet   3   . ibuprofen (ADVIL,MOTRIN) 200 MG tablet   Oral   Take 600 mg by mouth every 6 (six) hours as needed for pain.         Marland Kitchen inFLIXimab (REMICADE) 100 MG injection   Intravenous   Inject 5 mg/kg into the vein See admin instructions. Remicade 5 mg/kg IV q 6 weeks         . lisinopril-hydrochlorothiazide (PRINZIDE,ZESTORETIC) 20-25 MG per tablet   Oral   Take 1 tablet by mouth  daily.   30 tablet   11   . PARoxetine (PAXIL) 20 MG tablet   Oral   Take 2 tablets (40 mg total) by mouth every morning.   60 tablet   11   . zolpidem (AMBIEN) 5 MG tablet   Oral   Take 1 tablet (5 mg total) by mouth at bedtime as needed for sleep.   15 tablet   1   . HYDROcodone-acetaminophen (NORCO/VICODIN) 5-325 MG per tablet   Oral   Take 1 tablet by mouth every 6 (six) hours as needed for pain.   14 tablet   0   . penicillin v potassium (VEETID) 500 MG tablet   Oral   Take 1 tablet (500 mg total) by mouth 4 (four) times daily.   39 tablet   0    BP 153/92  Pulse 94  Temp(Src) 97.7 F (36.5 C) (Oral)  Resp 18  Ht 5' 9"  (1.753 m)  Wt 222 lb (100.699 kg)  BMI 32.77 kg/m2  SpO2 97% Physical Exam  Nursing note and vitals reviewed. Constitutional: He is oriented to person, place, and time. He appears well-developed and well-nourished.  HENT:  Head: Normocephalic.    Mouth/Throat:    Eyes: Pupils are equal, round, and reactive to light.  Neck: Normal range of motion.  Cardiovascular: Normal rate and regular rhythm.   Pulmonary/Chest: Effort normal and breath sounds normal.  Lymphadenopathy:    He has no cervical adenopathy.  Neurological: He is alert and oriented to person, place, and time.  Skin: Skin is warm and dry.    ED Course  Procedures (including critical care time) Labs Reviewed - No data to display No results found. 1. Pain, dental     MDM   Antibiotic, and Vicodin  Garald Balding, NP 01/09/13 0151

## 2013-01-09 NOTE — ED Provider Notes (Signed)
Medical screening examination/treatment/procedure(s) were performed by non-physician practitioner and as supervising physician I was immediately available for consultation/collaboration.  Kalman Drape, MD 01/09/13 [phone]

## 2013-01-09 NOTE — ED Notes (Signed)
Pt presents with right sided facial pain that began around 8pm last night, pt has taken 2 doses of Tylenol and an Ambien for pain without relief.  Pt states that he has had an upper respiratory infection and "was digging in his ear the other day and something gross came out".  Denies any injury tonight.  Will continue to monitor pt.

## 2013-01-10 ENCOUNTER — Ambulatory Visit: Payer: Managed Care, Other (non HMO) | Admitting: Internal Medicine

## 2013-01-10 ENCOUNTER — Other Ambulatory Visit: Payer: Self-pay | Admitting: Internal Medicine

## 2013-01-10 DIAGNOSIS — F411 Generalized anxiety disorder: Secondary | ICD-10-CM

## 2013-01-10 DIAGNOSIS — G4700 Insomnia, unspecified: Secondary | ICD-10-CM

## 2013-01-10 DIAGNOSIS — Z0289 Encounter for other administrative examinations: Secondary | ICD-10-CM

## 2013-01-10 DIAGNOSIS — F4329 Adjustment disorder with other symptoms: Secondary | ICD-10-CM

## 2013-01-10 MED ORDER — ALPRAZOLAM 1 MG PO TABS: 1.0000 mg | ORAL_TABLET | Freq: Every evening | ORAL | Status: DC | PRN

## 2013-01-19 ENCOUNTER — Ambulatory Visit: Payer: Managed Care, Other (non HMO) | Admitting: Internal Medicine

## 2013-02-06 ENCOUNTER — Other Ambulatory Visit: Payer: Self-pay

## 2013-02-06 DIAGNOSIS — K5090 Crohn's disease, unspecified, without complications: Secondary | ICD-10-CM

## 2013-02-08 ENCOUNTER — Other Ambulatory Visit: Payer: Self-pay | Admitting: Internal Medicine

## 2013-02-08 ENCOUNTER — Ambulatory Visit: Payer: Private Health Insurance - Indemnity

## 2013-02-08 ENCOUNTER — Encounter (HOSPITAL_COMMUNITY): Payer: Self-pay

## 2013-02-08 ENCOUNTER — Emergency Department (INDEPENDENT_AMBULATORY_CARE_PROVIDER_SITE_OTHER)
Admission: EM | Admit: 2013-02-08 | Discharge: 2013-02-08 | Disposition: A | Discharge status: Home / Self Care | Payer: Managed Care, Other (non HMO) | Source: Home / Self Care | Attending: Family Medicine | Admitting: Family Medicine

## 2013-02-08 ENCOUNTER — Encounter (HOSPITAL_COMMUNITY): Admission: RE | Admit: 2013-02-08 | Payer: BC Managed Care – PPO | Source: Ambulatory Visit

## 2013-02-08 DIAGNOSIS — K029 Dental caries, unspecified: Secondary | ICD-10-CM

## 2013-02-08 MED ORDER — KETOROLAC TROMETHAMINE 10 MG PO TABS: 10.0000 mg | ORAL_TABLET | Freq: Four times a day (QID) | ORAL | Status: DC | PRN

## 2013-02-08 MED ORDER — CLINDAMYCIN HCL 300 MG PO CAPS: 300.0000 mg | ORAL_CAPSULE | Freq: Three times a day (TID) | ORAL | Status: DC

## 2013-02-08 NOTE — ED Provider Notes (Signed)
CSN: [id number]     Arrival date & time 02/08/13  1645 History     First MD Initiated Contact with Patient 02/08/13 1724     Chief Complaint  Patient presents with  . Dental Problem   (Consider location/radiation/quality/duration/timing/severity/associated sxs/prior Treatment) Patient is a 37 y.o. male presenting with tooth pain. The history is provided by the patient.  Dental Pain Location:  Lower Lower teeth location:  31/RL 2nd molar Quality:  Throbbing Severity:  Moderate Onset quality:  Sudden (after biting on food tooth broke again.) Duration:  1 day Progression:  Worsening Relieved by:  Nothing Associated symptoms: no facial pain, no facial swelling and no fever   Risk factors: lack of dental care     Past Medical History  Diagnosis Date  . Obstructive sleep apnea (adult) (pediatric)   . Anxiety and depression   . Essential hypertension, benign   . Obesity, unspecified   . Herpes zoster infection 07/2010, 12/2010    with post-herpetic neuralgia  . Vitamin D deficiency   . Crohn's disease   . Hyperlipidemia   . CMV (cytomegalovirus infection) 10/28/2010  . Nephrolithiasis   . Fistula, perirectal     history of  . Sinus infection 08/05/11  . Gastritis   . Duodenitis    Past Surgical History  Procedure Laterality Date  . Small intestine surgery  08/05/07    1 ft removed, ileo-cecectomy  . Hypospadias correction      Childhood   . Upper gastrointestinal endoscopy  10/15/2010    w/biopsy, mild gastritis and duodenitis  . Colonoscopy  10/14/2007    crohn's colitis, aphthous ulcers, mild anal stenosis   Family History  Problem Relation Age of Onset  . Heart disease Father     CAD/MI  . Hyperlipidemia Father   . Hypertension Father   . Gout Father   . Sleep apnea Father   . Colon cancer Neg Hx   . Diabetes Neg Hx   . COPD Neg Hx   . Obesity Mother   . Allergies Mother   . Cancer Mother   . Stroke Maternal Grandfather   . Allergies Sister   . Asthma Sister     History  Substance Use Topics  . Smoking status: Never Smoker   . Smokeless tobacco: Never Used  . Alcohol Use: No    Review of Systems  Constitutional: Negative.  Negative for fever.  HENT: Positive for dental problem. Negative for facial swelling.     Allergies  Review of patient's allergies indicates no known allergies.  Home Medications   Current Outpatient Rx  Name  Route  Sig  Dispense  Refill  . acetaminophen (TYLENOL) 325 MG tablet   Oral   Take 650 mg by mouth every 6 (six) hours as needed for pain. PAIN         . ALPRAZolam (XANAX) 1 MG tablet   Oral   Take 1 tablet (1 mg total) by mouth at bedtime as needed for sleep.   30 tablet   0   . buPROPion (WELLBUTRIN XL) 150 MG 24 hr tablet   Oral   Take 1 tablet (150 mg total) by mouth daily.   30 tablet   3   . clindamycin (CLEOCIN) 300 MG capsule   Oral   Take 1 capsule (300 mg total) by mouth 3 (three) times daily.   28 capsule   0   . HYDROcodone-acetaminophen (NORCO/VICODIN) 5-325 MG per tablet   Oral   Take  1 tablet by mouth every 6 (six) hours as needed for pain.   14 tablet   0   . ibuprofen (ADVIL,MOTRIN) 200 MG tablet   Oral   Take 600 mg by mouth every 6 (six) hours as needed for pain.         Marland Kitchen inFLIXimab (REMICADE) 100 MG injection   Intravenous   Inject 5 mg/kg into the vein See admin instructions. Remicade 5 mg/kg IV q 6 weeks         . ketorolac (TORADOL) 10 MG tablet   Oral   Take 1 tablet (10 mg total) by mouth every 6 (six) hours as needed for pain.   20 tablet   0   . lisinopril-hydrochlorothiazide (PRINZIDE,ZESTORETIC) 20-25 MG per tablet   Oral   Take 1 tablet by mouth daily.   30 tablet   11   . PARoxetine (PAXIL) 20 MG tablet   Oral   Take 2 tablets (40 mg total) by mouth every morning.   60 tablet   11   . penicillin v potassium (VEETID) 500 MG tablet   Oral   Take 1 tablet (500 mg total) by mouth 4 (four) times daily.   39 tablet   0   . zolpidem  (AMBIEN) 5 MG tablet   Oral   Take 1 tablet (5 mg total) by mouth at bedtime as needed for sleep.   15 tablet   1    BP 131/86  Pulse 78  Temp(Src) 98.1 F (36.7 C) (Oral)  Resp 16  SpO2 100% Physical Exam  Nursing note and vitals reviewed. Constitutional: He is oriented to person, place, and time. He appears well-developed and well-nourished. He appears distressed.  HENT:  Mouth/Throat: Uvula is midline, oropharynx is clear and moist and mucous membranes are normal. Abnormal dentition.    Eyes: Pupils are equal, round, and reactive to light.  Neck: Normal range of motion. Neck supple.  Lymphadenopathy:    He has no cervical adenopathy.  Neurological: He is alert and oriented to person, place, and time.  Skin: Skin is warm and dry.    ED Course   Procedures (including critical care time)  Labs Reviewed - No data to display No results found. 1. Pain due to dental caries     MDM    Billy Fischer, MD 02/08/13 [phone]

## 2013-02-08 NOTE — ED Notes (Signed)
I think I broke a tooth

## 2013-02-09 ENCOUNTER — Encounter (HOSPITAL_COMMUNITY): Payer: Managed Care, Other (non HMO)

## 2013-02-09 ENCOUNTER — Other Ambulatory Visit: Payer: Self-pay | Admitting: *Deleted

## 2013-02-09 MED ORDER — ALPRAZOLAM 1 MG PO TABS: 1.0000 mg | ORAL_TABLET | Freq: Every evening | ORAL | Status: DC | PRN

## 2013-02-10 ENCOUNTER — Ambulatory Visit: Payer: Managed Care, Other (non HMO) | Admitting: Internal Medicine

## 2013-02-10 ENCOUNTER — Encounter: Payer: Self-pay | Admitting: Internal Medicine

## 2013-02-10 ENCOUNTER — Ambulatory Visit (INDEPENDENT_AMBULATORY_CARE_PROVIDER_SITE_OTHER): Payer: Managed Care, Other (non HMO) | Admitting: Internal Medicine

## 2013-02-10 VITALS — BP 112/72 | HR 79 | Temp 98.8°F | Ht 69.0 in | Wt 220.5 lb

## 2013-02-10 DIAGNOSIS — F411 Generalized anxiety disorder: Secondary | ICD-10-CM

## 2013-02-10 DIAGNOSIS — Q539 Undescended testicle, unspecified: Secondary | ICD-10-CM

## 2013-02-10 DIAGNOSIS — K089 Disorder of teeth and supporting structures, unspecified: Secondary | ICD-10-CM

## 2013-02-10 DIAGNOSIS — R310 Gross hematuria: Secondary | ICD-10-CM

## 2013-02-10 DIAGNOSIS — K0889 Other specified disorders of teeth and supporting structures: Secondary | ICD-10-CM | POA: Insufficient documentation

## 2013-02-10 MED ORDER — TRAMADOL HCL 50 MG PO TABS: 50.0000 mg | ORAL_TABLET | Freq: Four times a day (QID) | ORAL | Status: DC | PRN

## 2013-02-10 NOTE — Patient Instructions (Signed)
Please take all new medication as prescribed - the pain medication (tramadol)  Please continue all other medications as before, and refills have been done if requested.  Please go to the LAB in the Basement (turn left off the elevator) for the tests to be done today  You will be contacted by phone if any changes need to be made immediately.  Otherwise, you will receive a letter about your results with an explanation, but please check with MyChart first.  You will be contacted regarding the referral for: CT scan, and urology

## 2013-02-10 NOTE — Progress Notes (Signed)
Subjective:    Patient ID: [name] [name], male    DOB: 07-Feb-1976, 37 y.o.   MRN: [id number]  HPI  Here to f/u with acute c/o of 2 episodes BRB on urination last night and this am, but Denies urinary symptoms such as dysuria, frequency, urgency, flank pain, or n/v, fever, chills.  Has hx of renal stones, as well as undescended testicle.   Also seen at [facility] last Wednesday with cracked tooth, tx with cleocin and toradol oral for pain but pain med not working, has dental appt in a few days.    Also with recurrent sweats with going to work at a new position, just promoted,paxil has been ok until now Past Medical History  Diagnosis Date  . Obstructive sleep apnea (adult) (pediatric)   . Anxiety and depression   . Essential hypertension, benign   . Obesity, unspecified   . Herpes zoster infection 07/2010, 12/2010    with post-herpetic neuralgia  . Vitamin D deficiency   . Crohn's disease   . Hyperlipidemia   . CMV (cytomegalovirus infection) 10/28/2010  . Nephrolithiasis   . Fistula, perirectal     history of  . Sinus infection 08/05/11  . Gastritis   . Duodenitis    Past Surgical History  Procedure Laterality Date  . Small intestine surgery  08/05/07    1 ft removed, ileo-cecectomy  . Hypospadias correction      Childhood   . Upper gastrointestinal endoscopy  10/15/2010    w/biopsy, mild gastritis and duodenitis  . Colonoscopy  10/14/2007    crohn's colitis, aphthous ulcers, mild anal stenosis    reports that he has never smoked. He has never used smokeless tobacco. He reports that he does not drink alcohol or use illicit drugs. family history includes Allergies in his mother and sister; Asthma in his sister; Cancer in his mother; Gout in his father; Heart disease in his father; Hyperlipidemia in his father; Hypertension in his father; Obesity in his mother; Sleep apnea in his father; Stroke in his maternal grandfather. There is no history of Colon cancer, Diabetes, or COPD. No Known  Allergies Current Outpatient Prescriptions on File Prior to Visit  Medication Sig Dispense Refill  . acetaminophen (TYLENOL) 325 MG tablet Take 650 mg by mouth every 6 (six) hours as needed for pain. PAIN      . ALPRAZolam (XANAX) 1 MG tablet Take 1 tablet (1 mg total) by mouth at bedtime as needed for sleep.  30 tablet  0  . clindamycin (CLEOCIN) 300 MG capsule Take 1 capsule (300 mg total) by mouth 3 (three) times daily.  28 capsule  0  . ibuprofen (ADVIL,MOTRIN) 200 MG tablet Take 600 mg by mouth every 6 (six) hours as needed for pain.      Marland Kitchen inFLIXimab (REMICADE) 100 MG injection Inject 5 mg/kg into the vein See admin instructions. Remicade 5 mg/kg IV q 6 weeks      . ketorolac (TORADOL) 10 MG tablet Take 1 tablet (10 mg total) by mouth every 6 (six) hours as needed for pain.  20 tablet  0  . lisinopril-hydrochlorothiazide (PRINZIDE,ZESTORETIC) 20-25 MG per tablet Take 1 tablet by mouth daily.  30 tablet  11  . PARoxetine (PAXIL) 20 MG tablet Take 2 tablets (40 mg total) by mouth every morning.  60 tablet  11  . zolpidem (AMBIEN) 5 MG tablet Take 1 tablet (5 mg total) by mouth at bedtime as needed for sleep.  15 tablet  1  No current facility-administered medications on file prior to visit.   Review of Systems  Constitutional: Negative for unexpected weight change, or unusual diaphoresis  HENT: Negative for tinnitus.   Eyes: Negative for photophobia and visual disturbance.  Respiratory: Negative for choking and stridor.   Gastrointestinal: Negative for vomiting and blood in stool.  Genitourinary: Negative for hematuria and decreased urine volume.  Musculoskeletal: Negative for acute joint swelling Skin: Negative for color change and wound.  Neurological: Negative for tremors and numbness other than noted  Psychiatric/Behavioral: Negative for decreased concentration or  hyperactivity.       Objective:   Physical Exam BP 112/72  Pulse 79  Temp(Src) 98.8 F (37.1 C) (Oral)  Ht 5'  9" (1.753 m)  Wt 220 lb 8 oz (100.018 kg)  BMI 32.55 kg/m2  SpO2 96% VS noted, not ill appearing Constitutional: Pt appears well-developed and well-nourished. [name] [name] HENT: Head: NCAT.  Right Ear: External ear normal.  Left Ear: External ear normal.  Eyes: Conjunctivae and EOM are normal. Pupils are equal, round, and reactive to light.  Right back molar with mild gingival tender sweling Neck: Normal range of motion. Neck supple.  Cardiovascular: Normal rate and regular rhythm.   Pulmonary/Chest: Effort normal and breath sounds normal.  Abd:  Soft, NT, non-distended, + BS Neurological: Pt is alert. Not confused  Skin: Skin is warm. No erythema.  Psychiatric: Pt behavior is normal. Thought content normal. 2+ nervous    Assessment & Plan:

## 2013-02-11 NOTE — Assessment & Plan Note (Signed)
Suspect reason for sweats, declines futher tx,  to f/u any worsening symptoms or concerns

## 2013-02-11 NOTE — Assessment & Plan Note (Signed)
Afeb, no drainage, for tramadol prn for hopefully better pain control, f/u with dental as planned

## 2013-02-11 NOTE — Assessment & Plan Note (Signed)
Was noted per pt approx 5 yrs ago, did not f/u with urology at that time and now mentions may need further tx

## 2013-02-11 NOTE — Assessment & Plan Note (Signed)
asympt except for blood, despite lack of pain moe likely to be stone relateed, for CT for further eval, also urine cx and refer urology, doubt malignancy but cant be r/o

## 2013-02-13 ENCOUNTER — Inpatient Hospital Stay: Admission: RE | Admit: 2013-02-13 | Payer: Managed Care, Other (non HMO) | Source: Ambulatory Visit

## 2013-02-15 ENCOUNTER — Ambulatory Visit (HOSPITAL_COMMUNITY)
Admission: RE | Admit: 2013-02-15 | Payer: Managed Care, Other (non HMO) | Source: Ambulatory Visit | Attending: Internal Medicine | Admitting: Internal Medicine

## 2013-02-16 ENCOUNTER — Other Ambulatory Visit: Payer: Self-pay | Admitting: Internal Medicine

## 2013-02-16 ENCOUNTER — Encounter: Payer: Self-pay | Admitting: Internal Medicine

## 2013-02-16 MED ORDER — HYDROCODONE-ACETAMINOPHEN 5-325 MG PO TABS: 1.0000 | ORAL_TABLET | Freq: Four times a day (QID) | ORAL | Status: DC | PRN

## 2013-02-16 NOTE — Progress Notes (Signed)
Norco script called to [facility].

## 2013-02-21 ENCOUNTER — Other Ambulatory Visit: Payer: Self-pay | Admitting: Internal Medicine

## 2013-02-21 MED ORDER — HYDROCODONE-ACETAMINOPHEN 5-325 MG PO TABS: 1.0000 | ORAL_TABLET | Freq: Four times a day (QID) | ORAL | Status: DC | PRN

## 2013-02-21 NOTE — Telephone Encounter (Signed)
RX faxed to pharmacy.

## 2013-02-22 ENCOUNTER — Encounter (HOSPITAL_COMMUNITY): Payer: Self-pay

## 2013-02-22 ENCOUNTER — Ambulatory Visit (HOSPITAL_COMMUNITY)
Admission: RE | Admit: 2013-02-22 | Discharge: 2013-02-22 | Disposition: A | Discharge status: Home / Self Care | Payer: Managed Care, Other (non HMO) | Source: Ambulatory Visit | Attending: Internal Medicine | Admitting: Internal Medicine

## 2013-02-22 VITALS — BP 134/80 | HR 63 | Temp 97.8°F | Resp 16 | Ht 69.0 in | Wt 220.5 lb

## 2013-02-22 DIAGNOSIS — K5090 Crohn's disease, unspecified, without complications: Secondary | ICD-10-CM | POA: Insufficient documentation

## 2013-02-22 MED ORDER — ACETAMINOPHEN 325 MG PO TABS
650.0000 mg | ORAL_TABLET | Freq: Every day | ORAL | Status: DC
  Administered 2013-02-22: 650 mg via ORAL
  Filled 2013-02-22: qty 2

## 2013-02-22 MED ORDER — SODIUM CHLORIDE 0.9 % IV SOLN
5.0000 mg/kg | INTRAVENOUS | Status: DC
  Administered 2013-02-22: 500 mg via INTRAVENOUS
  Filled 2013-02-22: qty 50

## 2013-02-22 MED ORDER — DIPHENHYDRAMINE HCL 25 MG PO TABS
50.0000 mg | ORAL_TABLET | Freq: Every day | ORAL | Status: DC
  Administered 2013-02-22: 50 mg via ORAL
  Filled 2013-02-22 (×2): qty 2

## 2013-02-22 MED ORDER — SODIUM CHLORIDE 0.9 % IV SOLN
INTRAVENOUS | Status: DC
  Administered 2013-02-22: 07:00:00 via INTRAVENOUS

## 2013-03-03 ENCOUNTER — Other Ambulatory Visit: Payer: Self-pay | Admitting: Internal Medicine

## 2013-03-03 ENCOUNTER — Emergency Department (HOSPITAL_COMMUNITY)
Admission: EM | Admit: 2013-03-03 | Discharge: 2013-03-03 | Disposition: A | Discharge status: Home / Self Care | Payer: Managed Care, Other (non HMO) | Attending: Emergency Medicine | Admitting: Emergency Medicine

## 2013-03-03 ENCOUNTER — Encounter (HOSPITAL_COMMUNITY): Payer: Self-pay

## 2013-03-03 DIAGNOSIS — R51 Headache: Secondary | ICD-10-CM | POA: Insufficient documentation

## 2013-03-03 DIAGNOSIS — Y929 Unspecified place or not applicable: Secondary | ICD-10-CM | POA: Insufficient documentation

## 2013-03-03 DIAGNOSIS — Z8709 Personal history of other diseases of the respiratory system: Secondary | ICD-10-CM | POA: Insufficient documentation

## 2013-03-03 DIAGNOSIS — Z862 Personal history of diseases of the blood and blood-forming organs and certain disorders involving the immune mechanism: Secondary | ICD-10-CM | POA: Insufficient documentation

## 2013-03-03 DIAGNOSIS — E669 Obesity, unspecified: Secondary | ICD-10-CM | POA: Insufficient documentation

## 2013-03-03 DIAGNOSIS — Z79899 Other long term (current) drug therapy: Secondary | ICD-10-CM | POA: Insufficient documentation

## 2013-03-03 DIAGNOSIS — Z8619 Personal history of other infectious and parasitic diseases: Secondary | ICD-10-CM | POA: Insufficient documentation

## 2013-03-03 DIAGNOSIS — Y9389 Activity, other specified: Secondary | ICD-10-CM | POA: Insufficient documentation

## 2013-03-03 DIAGNOSIS — F3289 Other specified depressive episodes: Secondary | ICD-10-CM | POA: Insufficient documentation

## 2013-03-03 DIAGNOSIS — F411 Generalized anxiety disorder: Secondary | ICD-10-CM | POA: Insufficient documentation

## 2013-03-03 DIAGNOSIS — Z8639 Personal history of other endocrine, nutritional and metabolic disease: Secondary | ICD-10-CM | POA: Insufficient documentation

## 2013-03-03 DIAGNOSIS — Z8719 Personal history of other diseases of the digestive system: Secondary | ICD-10-CM | POA: Insufficient documentation

## 2013-03-03 DIAGNOSIS — Z87442 Personal history of urinary calculi: Secondary | ICD-10-CM | POA: Insufficient documentation

## 2013-03-03 DIAGNOSIS — F329 Major depressive disorder, single episode, unspecified: Secondary | ICD-10-CM | POA: Insufficient documentation

## 2013-03-03 DIAGNOSIS — X58XXXA Exposure to other specified factors, initial encounter: Secondary | ICD-10-CM | POA: Insufficient documentation

## 2013-03-03 DIAGNOSIS — I10 Essential (primary) hypertension: Secondary | ICD-10-CM | POA: Insufficient documentation

## 2013-03-03 DIAGNOSIS — S025XXA Fracture of tooth (traumatic), initial encounter for closed fracture: Secondary | ICD-10-CM

## 2013-03-03 MED ORDER — HYDROCODONE-ACETAMINOPHEN 5-325 MG PO TABS: 1.0000 | ORAL_TABLET | ORAL | Status: DC | PRN

## 2013-03-03 NOTE — ED Notes (Signed)
Per pt, chewing today and felt sharp pain.  ? Cracked tooth

## 2013-03-03 NOTE — ED Provider Notes (Signed)
CSN: [id number]     Arrival date & time 03/03/13  1959 History  This chart was scribed for non-physician practitioner, Hazel Sams [name], working with Lolita Patella, MD by Forrestine Him, ED Scribe. This patient was seen in room WTR6/WTR6 and the patient's care was started at [facility].    Chief Complaint  Patient presents with  . Dental Pain    The history is provided by the patient. No language interpreter was used.   HPI Comments: [name] [name] is a 37 y.o. male who presents to the Emergency Department complaining of sudden onset, constant right lower dental pain which started 3 hours ago. Pt states he was eating some hard chips, and feels he may have chipped his back right molar. Pt states he also has an associated HA from the pain. Pt describes the pain as throbbing pain, rates the dental pain 9/10. Pt states he does not have any bleeding or swelling to the affected area. Pt states he took 400 mg ibuprofen and orajel with no relief. Pt denies dizziness, light headedness. Pt states he does not have any allergies to any medications.  Past Medical History  Diagnosis Date  . Obstructive sleep apnea (adult) (pediatric)   . Anxiety and depression   . Essential hypertension, benign   . Obesity, unspecified   . Herpes zoster infection 07/2010, 12/2010    with post-herpetic neuralgia  . Vitamin D deficiency   . Crohn's disease   . Hyperlipidemia   . CMV (cytomegalovirus infection) 10/28/2010  . Nephrolithiasis   . Fistula, perirectal     history of  . Sinus infection 08/05/11  . Gastritis   . Duodenitis    Past Surgical History  Procedure Laterality Date  . Small intestine surgery  08/05/07    1 ft removed, ileo-cecectomy  . Hypospadias correction      Childhood   . Upper gastrointestinal endoscopy  10/15/2010    w/biopsy, mild gastritis and duodenitis  . Colonoscopy  10/14/2007    crohn's colitis, aphthous ulcers, mild anal stenosis   Family History  Problem Relation Age of Onset  .  Heart disease Father     CAD/MI  . Hyperlipidemia Father   . Hypertension Father   . Gout Father   . Sleep apnea Father   . Colon cancer Neg Hx   . Diabetes Neg Hx   . COPD Neg Hx   . Obesity Mother   . Allergies Mother   . Cancer Mother   . Stroke Maternal Grandfather   . Allergies Sister   . Asthma Sister    History  Substance Use Topics  . Smoking status: Never Smoker   . Smokeless tobacco: Never Used  . Alcohol Use: No    Review of Systems  HENT: Positive for dental problem (right lower molar).   Neurological: Positive for headaches. Negative for dizziness and light-headedness.    Allergies  Review of patient's allergies indicates no known allergies.  Home Medications   Current Outpatient Rx  Name  Route  Sig  Dispense  Refill  . ALPRAZolam (XANAX) 1 MG tablet   Oral   Take 1 tablet (1 mg total) by mouth at bedtime as needed for sleep.   30 tablet   0   . benzocaine (ORAJEL) 10 % mucosal gel   Mouth/Throat   Use as directed 1 application in the mouth or throat as needed for pain.         Marland Kitchen ibuprofen (ADVIL,MOTRIN) 200 MG tablet  Oral   Take 600 mg by mouth every 6 (six) hours as needed for pain.         Marland Kitchen inFLIXimab (REMICADE) 100 MG injection   Intravenous   Inject 5 mg/kg into the vein See admin instructions. Remicade 5 mg/kg IV q 6 weeks         . lisinopril-hydrochlorothiazide (PRINZIDE,ZESTORETIC) 20-25 MG per tablet   Oral   Take 1 tablet by mouth daily.   30 tablet   11   . PARoxetine (PAXIL) 20 MG tablet   Oral   Take 2 tablets (40 mg total) by mouth every morning.   60 tablet   11   . zolpidem (AMBIEN) 5 MG tablet   Oral   Take 1 tablet (5 mg total) by mouth at bedtime as needed for sleep.   15 tablet   1    BP 141/80  Pulse 61  Temp(Src) 98.8 F (37.1 C) (Oral)  Resp 18  SpO2 95% Physical Exam  Nursing note and vitals reviewed. Constitutional: He is oriented to person, place, and time. He appears well-developed and  well-nourished.  HENT:  Head: Normocephalic and atraumatic.  Posterior right lower second molar is partial chipped. No swelling under the tongue or to the lateral adjacent gums. No bleeding or drainage. Face is symmetric without obvious swelling   Eyes: EOM are normal.  Neck: Normal range of motion. Neck supple.  Cardiovascular: Normal rate, regular rhythm and normal heart sounds.   Pulmonary/Chest: Effort normal and breath sounds normal. No respiratory distress. He has no wheezes. He has no rales. He exhibits no tenderness.  Musculoskeletal: Normal range of motion.  Lymphadenopathy:    He has no cervical adenopathy.  Neurological: He is alert and oriented to person, place, and time.  Skin: Skin is warm and dry.  Psychiatric: He has a normal mood and affect. His behavior is normal.    ED Course  Procedures   DIAGNOSTIC STUDIES: Oxygen Saturation is 92% on RA, Adequate by my interpretation.    COORDINATION OF CARE: 9:48 PM-Discussed treatment plan which includes pain medication and referral to a dentist.  Pt agreed to plan.   Dental Block Performed by: Martie Lee Authorized by: Martie Lee Consent: Verbal consent obtained. Risks and benefits: risks, benefits and alternatives were discussed Consent given by: patient Patient identity confirmed: provided demographic data  Location: Right lower second molar  Local anesthetic: Bupivacaine 0.5% with epinephrine  Anesthetic total: 1.8 ml  Irrigation method: syringe  Patient tolerance: Patient tolerated the procedure well with no immediate complications. Pain improved.      MDM   1. Fracture, tooth, closed, initial encounter     Patient seen and evaluated. He appears well but does appear uncomfortable. No acute distress. Patient given dental block. Prescriptions for Norco and dental referral also provided.   I personally performed the services described in this documentation, which was scribed in my presence.  The recorded information has been reviewed and is accurate.    Martie Lee, [name] 03/03/13 2257

## 2013-03-06 ENCOUNTER — Ambulatory Visit (INDEPENDENT_AMBULATORY_CARE_PROVIDER_SITE_OTHER): Payer: Private Health Insurance - Indemnity | Admitting: Internal Medicine

## 2013-03-06 VITALS — BP 158/100 | HR 63 | Temp 98.1°F | Resp 18 | Ht 67.5 in | Wt 220.0 lb

## 2013-03-06 DIAGNOSIS — K0889 Other specified disorders of teeth and supporting structures: Secondary | ICD-10-CM

## 2013-03-06 DIAGNOSIS — K089 Disorder of teeth and supporting structures, unspecified: Secondary | ICD-10-CM

## 2013-03-06 MED ORDER — HYDROCODONE-ACETAMINOPHEN 7.5-325 MG/15ML PO SOLN: 15.0000 mL | Freq: Once | ORAL | Status: DC

## 2013-03-06 NOTE — Progress Notes (Signed)
  Subjective:    Patient ID: [name] [name], male    DOB: 05/22/1976, 37 y.o.   MRN: [id number]  HPI Cracked tooth 4 days ago. Was eating a chip this evening and felt like the tooth cracked more. Didn't take any pain meds since this morning. Has had to take 2 at a time. Was able to go to work today. Didn't take pain med to work, so did not have any to take.  Dentist can't see him until Friday.   Review of Systems No fever.    Objective:   Physical Exam  Constitutional: He appears well-developed and well-nourished.  Holding ice pack to right cheek. Right rear molar with questionable darkened area posteriorly. No drainage around tooth, no swelling. Tender. No nodes    Assessment & Plan:  Tooth pain - Plan: HYDROcodone-acetaminophen (HYCET) 7.5-325 mg/15 ml solution 15 mL Continue with Norco tomorrow as directed from ER Patient will call dentist office tomorrow morning to see if he can be seen earlier. Soft diet until pain resolved. HTN- encouraged patient to recheck once pain resolved.  Dg/rpd

## 2013-03-08 NOTE — ED Provider Notes (Signed)
Medical screening examination/treatment/procedure(s) were performed by non-physician practitioner and as supervising physician I was immediately available for consultation/collaboration.   Lolita Patella, MD 03/08/13 [phone]

## 2013-03-15 ENCOUNTER — Telehealth: Payer: Self-pay

## 2013-03-15 DIAGNOSIS — G4733 Obstructive sleep apnea (adult) (pediatric): Secondary | ICD-10-CM

## 2013-03-15 MED ORDER — ALPRAZOLAM 1 MG PO TABS
1.0000 mg | ORAL_TABLET | Freq: Every evening | ORAL | Status: DC | PRN
Start: 2013-03-15 — End: 2013-04-10

## 2013-03-15 NOTE — Telephone Encounter (Signed)
Ok to refill (phone in)

## 2013-03-15 NOTE — Telephone Encounter (Signed)
Refill request for Alprazolam

## 2013-03-15 NOTE — Telephone Encounter (Signed)
Called in alprazolam...ds,cma

## 2013-03-16 ENCOUNTER — Other Ambulatory Visit: Payer: Self-pay | Admitting: Internal Medicine

## 2013-04-03 ENCOUNTER — Telehealth: Payer: Self-pay

## 2013-04-03 NOTE — Telephone Encounter (Signed)
Left message on patients voice mail to call back and set up appointment with Dr. Silvano Rusk for Nov./Dec. For his chron's disease.

## 2013-04-05 ENCOUNTER — Telehealth: Payer: Self-pay | Admitting: Internal Medicine

## 2013-04-05 ENCOUNTER — Encounter (HOSPITAL_COMMUNITY): Payer: Self-pay

## 2013-04-05 ENCOUNTER — Encounter (HOSPITAL_COMMUNITY)
Admission: RE | Admit: 2013-04-05 | Discharge: 2013-04-05 | Disposition: A | Discharge status: Home / Self Care | Payer: BC Managed Care – PPO | Source: Ambulatory Visit | Attending: Internal Medicine | Admitting: Internal Medicine

## 2013-04-05 VITALS — BP 139/100 | HR 68 | Temp 97.6°F | Resp 18 | Ht 67.0 in | Wt 212.2 lb

## 2013-04-05 DIAGNOSIS — K5090 Crohn's disease, unspecified, without complications: Secondary | ICD-10-CM | POA: Insufficient documentation

## 2013-04-05 MED ORDER — SODIUM CHLORIDE 0.9 % IV SOLN
5.0000 mg/kg | INTRAVENOUS | Status: DC
  Administered 2013-04-05: 500 mg via INTRAVENOUS
  Filled 2013-04-05: qty 50

## 2013-04-05 MED ORDER — DIPHENHYDRAMINE HCL 25 MG PO CAPS
50.0000 mg | ORAL_CAPSULE | Freq: Every day | ORAL | Status: DC
  Administered 2013-04-05: 50 mg via ORAL
  Filled 2013-04-05: qty 2

## 2013-04-05 MED ORDER — SODIUM CHLORIDE 0.9 % IV SOLN
INTRAVENOUS | Status: DC
  Administered 2013-04-05: 08:00:00 via INTRAVENOUS

## 2013-04-05 MED ORDER — ACETAMINOPHEN 325 MG PO TABS
650.0000 mg | ORAL_TABLET | Freq: Every day | ORAL | Status: DC
  Administered 2013-04-05: 650 mg via ORAL
  Filled 2013-04-05: qty 2

## 2013-04-05 NOTE — Telephone Encounter (Signed)
Discussed with Dr. Carlean Purl.  Ok to wait until he can see his primary care next week, unless he is symptomatic with HA or CP.  He is asked to monitor his BP at home if possible.  He does not have a home machine, he is advised to go to a pharmacy daily that has a machine and check daily.  If he has HA or other symptoms before his appt next week then he should go to Urgent Care.  He verbalized understanding.

## 2013-04-05 NOTE — Telephone Encounter (Signed)
Patient reports that he has been hypertensive and is on meds.  Today during his Remicade infusion his BP was 158/110.  He said that he has been under a great deal of stress and that he can't see his primary care until Monday.  I have asked that he go to an Urgent Care about his BP.  He would like to see what Dr. Celesta Aver advise is prior to going to Urgent care.  I have paged Dr. Carlean Purl to discuss

## 2013-04-06 ENCOUNTER — Emergency Department (HOSPITAL_COMMUNITY)
Admission: EM | Admit: 2013-04-06 | Discharge: 2013-04-06 | Disposition: A | Discharge status: Home / Self Care | Payer: BC Managed Care – PPO | Attending: Emergency Medicine | Admitting: Emergency Medicine

## 2013-04-06 ENCOUNTER — Other Ambulatory Visit: Payer: Self-pay

## 2013-04-06 ENCOUNTER — Emergency Department (HOSPITAL_COMMUNITY): Payer: BC Managed Care – PPO

## 2013-04-06 ENCOUNTER — Encounter (HOSPITAL_COMMUNITY): Payer: Self-pay | Admitting: Emergency Medicine

## 2013-04-06 DIAGNOSIS — I10 Essential (primary) hypertension: Secondary | ICD-10-CM | POA: Insufficient documentation

## 2013-04-06 DIAGNOSIS — Z8719 Personal history of other diseases of the digestive system: Secondary | ICD-10-CM | POA: Insufficient documentation

## 2013-04-06 DIAGNOSIS — Z8619 Personal history of other infectious and parasitic diseases: Secondary | ICD-10-CM | POA: Insufficient documentation

## 2013-04-06 DIAGNOSIS — Z8639 Personal history of other endocrine, nutritional and metabolic disease: Secondary | ICD-10-CM | POA: Insufficient documentation

## 2013-04-06 DIAGNOSIS — Z87442 Personal history of urinary calculi: Secondary | ICD-10-CM | POA: Insufficient documentation

## 2013-04-06 DIAGNOSIS — R002 Palpitations: Secondary | ICD-10-CM

## 2013-04-06 DIAGNOSIS — Z8709 Personal history of other diseases of the respiratory system: Secondary | ICD-10-CM | POA: Insufficient documentation

## 2013-04-06 DIAGNOSIS — F411 Generalized anxiety disorder: Secondary | ICD-10-CM | POA: Insufficient documentation

## 2013-04-06 DIAGNOSIS — Z862 Personal history of diseases of the blood and blood-forming organs and certain disorders involving the immune mechanism: Secondary | ICD-10-CM | POA: Insufficient documentation

## 2013-04-06 DIAGNOSIS — E669 Obesity, unspecified: Secondary | ICD-10-CM | POA: Insufficient documentation

## 2013-04-06 DIAGNOSIS — Z79899 Other long term (current) drug therapy: Secondary | ICD-10-CM | POA: Insufficient documentation

## 2013-04-06 DIAGNOSIS — E876 Hypokalemia: Secondary | ICD-10-CM | POA: Insufficient documentation

## 2013-04-06 DIAGNOSIS — R0602 Shortness of breath: Secondary | ICD-10-CM | POA: Insufficient documentation

## 2013-04-06 DIAGNOSIS — F329 Major depressive disorder, single episode, unspecified: Secondary | ICD-10-CM | POA: Insufficient documentation

## 2013-04-06 DIAGNOSIS — F3289 Other specified depressive episodes: Secondary | ICD-10-CM | POA: Insufficient documentation

## 2013-04-06 LAB — POCT I-STAT, CHEM 8
BUN: 13 mg/dL (ref 6–23)
Chloride: 103 mEq/L (ref 96–112)
Creatinine, Ser: 1 mg/dL (ref 0.50–1.35)
Potassium: 2.9 mEq/L — ABNORMAL LOW (ref 3.5–5.1)
Sodium: 141 mEq/L (ref 135–145)
TCO2: 25 mmol/L (ref 0–100)

## 2013-04-06 LAB — CBC WITH DIFFERENTIAL/PLATELET
Basophils Absolute: 0 10*3/uL (ref 0.0–0.1)
Basophils Relative: 0 % (ref 0–1)
HCT: 43.5 % (ref 39.0–52.0)
Lymphocytes Relative: 47 % — ABNORMAL HIGH (ref 12–46)
Monocytes Absolute: 0.8 10*3/uL (ref 0.1–1.0)
Neutro Abs: 3.2 10*3/uL (ref 1.7–7.7)
Neutrophils Relative %: 41 % — ABNORMAL LOW (ref 43–77)
RDW: 14.3 % (ref 11.5–15.5)
WBC: 7.7 10*3/uL (ref 4.0–10.5)

## 2013-04-06 LAB — POCT I-STAT TROPONIN I

## 2013-04-06 MED ORDER — IOHEXOL 350 MG/ML SOLN
100.0000 mL | Freq: Once | INTRAVENOUS | Status: AC | PRN
  Administered 2013-04-06: 100 mL via INTRAVENOUS

## 2013-04-06 MED ORDER — POTASSIUM CHLORIDE CRYS ER 20 MEQ PO TBCR
60.0000 meq | EXTENDED_RELEASE_TABLET | Freq: Once | ORAL | Status: AC
  Administered 2013-04-06: 60 meq via ORAL
  Filled 2013-04-06: qty 3

## 2013-04-06 MED ORDER — POTASSIUM CHLORIDE CRYS ER 20 MEQ PO TBCR: 20.0000 meq | EXTENDED_RELEASE_TABLET | Freq: Two times a day (BID) | ORAL | Status: DC

## 2013-04-06 NOTE — ED Notes (Signed)
Pt reports palpitations that began this evening, pt admits to associating shortness of breath - pt denies any current chest pain, shortness of breath or palpations - pt A&Ox4, skin warm and dry. Pt w/o any recent hx of cough, fever, n/v.

## 2013-04-06 NOTE — ED Notes (Signed)
Pt. reports palpitations with SOB and chills onset this evening , pt. stated history of Crohn's Disease medicated with Remicade today .

## 2013-04-06 NOTE — ED Provider Notes (Signed)
CSN: [id number]     Arrival date & time 04/06/13  0009 History   None    Chief Complaint  Patient presents with  . Palpitations   (Consider location/radiation/quality/duration/timing/severity/associated sxs/prior Treatment) Patient is a 37 y.o. male presenting with palpitations. The history is provided by the patient. No language interpreter was used.  Palpitations Palpitations quality:  Fast Onset quality:  Gradual Timing:  Constant Progression:  Unchanged Chronicity:  New Context: anxiety   Context: not caffeine   Context comment:  Remiade infusion earlier in the day Relieved by:  Nothing Worsened by:  Nothing tried Ineffective treatments:  None tried Associated symptoms: shortness of breath   Associated symptoms: no chest pain, no cough, no diaphoresis, no syncope and no vomiting   Risk factors: no hx of PE     Past Medical History  Diagnosis Date  . Obstructive sleep apnea (adult) (pediatric)   . Anxiety and depression   . Essential hypertension, benign   . Obesity, unspecified   . Herpes zoster infection 07/2010, 12/2010    with post-herpetic neuralgia  . Vitamin D deficiency   . Crohn's disease   . Hyperlipidemia   . CMV (cytomegalovirus infection) 10/28/2010  . Nephrolithiasis   . Fistula, perirectal     history of  . Sinus infection 08/05/11  . Gastritis   . Duodenitis   . Depression   . Anxiety    Past Surgical History  Procedure Laterality Date  . Small intestine surgery  08/05/07    1 ft removed, ileo-cecectomy  . Hypospadias correction      Childhood   . Upper gastrointestinal endoscopy  10/15/2010    w/biopsy, mild gastritis and duodenitis  . Colonoscopy  10/14/2007    crohn's colitis, aphthous ulcers, mild anal stenosis  . Colon resection     Family History  Problem Relation Age of Onset  . Heart disease Father     CAD/MI  . Hyperlipidemia Father   . Hypertension Father   . Gout Father   . Sleep apnea Father   . Colon cancer Neg Hx   .  Diabetes Neg Hx   . COPD Neg Hx   . Obesity Mother   . Allergies Mother   . Cancer Mother   . Stroke Maternal Grandfather   . Allergies Sister   . Asthma Sister    History  Substance Use Topics  . Smoking status: Never Smoker   . Smokeless tobacco: Never Used  . Alcohol Use: 0.0 oz/week     Comment: rare    Review of Systems  Constitutional: Negative for fever and diaphoresis.  Respiratory: Positive for shortness of breath. Negative for cough.   Cardiovascular: Positive for palpitations. Negative for chest pain, leg swelling and syncope.  Gastrointestinal: Negative for vomiting.  All other systems reviewed and are negative.    Allergies  Review of patient's allergies indicates no known allergies.  Home Medications   Current Outpatient Rx  Name  Route  Sig  Dispense  Refill  . ALPRAZolam (XANAX) 1 MG tablet   Oral   Take 1 tablet (1 mg total) by mouth at bedtime as needed for sleep.   30 tablet   0   . inFLIXimab (REMICADE) 100 MG injection   Intravenous   Inject 5 mg/kg into the vein See admin instructions. Remicade 5 mg/kg IV q 6 weeks         . lisinopril-hydrochlorothiazide (PRINZIDE,ZESTORETIC) 20-25 MG per tablet   Oral   Take 1 tablet  by mouth daily.   30 tablet   11   . PARoxetine (PAXIL) 20 MG tablet   Oral   Take 2 tablets (40 mg total) by mouth every morning.   60 tablet   11   . zolpidem (AMBIEN) 5 MG tablet   Oral   Take 1 tablet (5 mg total) by mouth at bedtime as needed for sleep.   15 tablet   1    BP 159/87  Pulse 80  Temp(Src) 98.5 F (36.9 C) (Oral)  Resp 18  Wt 215 lb 3 oz (97.608 kg)  BMI 33.7 kg/m2  SpO2 96% Physical Exam  Constitutional: He is oriented to person, place, and time. He appears well-developed and well-nourished.  HENT:  Head: Normocephalic and atraumatic.  Mouth/Throat: Oropharynx is clear and moist.  Eyes: Conjunctivae are normal. Pupils are equal, round, and reactive to light.  Neck: Normal range of  motion. Neck supple.  Cardiovascular: Normal rate, regular rhythm and intact distal pulses.   Pulmonary/Chest: Effort normal. He has no wheezes. He has no rales.  Abdominal: Soft. Bowel sounds are normal. There is no tenderness. There is no rebound and no guarding.  Musculoskeletal: Normal range of motion.  Neurological: He is alert and oriented to person, place, and time.  Skin: Skin is warm and dry. He is not diaphoretic.  Psychiatric: He has a normal mood and affect.    ED Course  Procedures (including critical care time) Labs Review Labs Reviewed  CBC WITH DIFFERENTIAL   Imaging Review No results found.  EKG Interpretation   None       MDM  No diagnosis found.  Date: 04/06/2013  Rate: 86  Rhythm: normal sinus rhythm  QRS Axis: normal  Intervals: normal  ST/T Wave abnormalities: normal  Conduction Disutrbances: none  Narrative Interpretation: unremarkable    No abdominal pain, no fevers no cough.  Suspect anxiety from ongoing stress related to financial situation.  Follow up with PMD will treat hypokalemia    Reese Stockman K Albi Rappaport-Rasch, MD 04/06/13 [phone]

## 2013-04-10 ENCOUNTER — Ambulatory Visit (INDEPENDENT_AMBULATORY_CARE_PROVIDER_SITE_OTHER): Payer: BC Managed Care – PPO | Admitting: Internal Medicine

## 2013-04-10 ENCOUNTER — Encounter: Payer: Self-pay | Admitting: Internal Medicine

## 2013-04-10 VITALS — BP 138/98 | HR 68 | Temp 98.3°F | Resp 12 | Wt 214.0 lb

## 2013-04-10 DIAGNOSIS — Z638 Other specified problems related to primary support group: Secondary | ICD-10-CM

## 2013-04-10 DIAGNOSIS — F411 Generalized anxiety disorder: Secondary | ICD-10-CM

## 2013-04-10 DIAGNOSIS — F4329 Adjustment disorder with other symptoms: Secondary | ICD-10-CM

## 2013-04-10 DIAGNOSIS — F439 Reaction to severe stress, unspecified: Secondary | ICD-10-CM

## 2013-04-10 DIAGNOSIS — Z658 Other specified problems related to psychosocial circumstances: Secondary | ICD-10-CM

## 2013-04-10 DIAGNOSIS — Z733 Stress, not elsewhere classified: Secondary | ICD-10-CM

## 2013-04-10 DIAGNOSIS — F4320 Adjustment disorder, unspecified: Secondary | ICD-10-CM

## 2013-04-10 DIAGNOSIS — G4700 Insomnia, unspecified: Secondary | ICD-10-CM

## 2013-04-10 MED ORDER — ZOLPIDEM TARTRATE 5 MG PO TABS: 5.0000 mg | ORAL_TABLET | Freq: Every evening | ORAL | Status: DC | PRN

## 2013-04-10 MED ORDER — ALPRAZOLAM 1 MG PO TABS: 1.0000 mg | ORAL_TABLET | Freq: Two times a day (BID) | ORAL | Status: DC | PRN

## 2013-04-10 NOTE — Progress Notes (Signed)
Subjective:    Patient ID: [name] [name], male    DOB: 1975/07/02, 37 y.o.   MRN: [id number]  HPI  Pt presents to the clinic today with c/o elevated blood pressure and shortness of breath. He went to the ER for the same a few days ago. He did have an EKG which was normal. Chest CT and labwork was all normal. He does report that he is having a lot of trouble with his ex wife. His stress level is increased. He does have a history of anxiety and is currently being treated with paxil and xanax.   Review of Systems      Past Medical History  Diagnosis Date  . Obstructive sleep apnea (adult) (pediatric)   . Anxiety and depression   . Essential hypertension, benign   . Obesity, unspecified   . Herpes zoster infection 07/2010, 12/2010    with post-herpetic neuralgia  . Vitamin D deficiency   . Crohn's disease   . Hyperlipidemia   . CMV (cytomegalovirus infection) 10/28/2010  . Nephrolithiasis   . Fistula, perirectal     history of  . Sinus infection 08/05/11  . Gastritis   . Duodenitis   . Depression   . Anxiety     Current Outpatient Prescriptions  Medication Sig Dispense Refill  . ALPRAZolam (XANAX) 1 MG tablet Take 1 tablet (1 mg total) by mouth at bedtime as needed for sleep.  30 tablet  0  . inFLIXimab (REMICADE) 100 MG injection Inject 5 mg/kg into the vein See admin instructions. Remicade 5 mg/kg IV q 6 weeks      . lisinopril-hydrochlorothiazide (PRINZIDE,ZESTORETIC) 20-25 MG per tablet Take 1 tablet by mouth daily.  30 tablet  11  . PARoxetine (PAXIL) 20 MG tablet Take 2 tablets (40 mg total) by mouth every morning.  60 tablet  11  . potassium chloride SA (K-DUR,KLOR-CON) 20 MEQ tablet Take 1 tablet (20 mEq total) by mouth 2 (two) times daily.  14 tablet  0  . zolpidem (AMBIEN) 5 MG tablet Take 1 tablet (5 mg total) by mouth at bedtime as needed for sleep.  15 tablet  1   Current Facility-Administered Medications  Medication Dose Route Frequency Provider Last Rate Last Dose   . HYDROcodone-acetaminophen (HYCET) 7.5-325 mg/15 ml solution 15 mL  15 mL Oral Once Mancel Bale, [name]        No Known Allergies  Family History  Problem Relation Age of Onset  . Heart disease Father     CAD/MI  . Hyperlipidemia Father   . Hypertension Father   . Gout Father   . Sleep apnea Father   . Colon cancer Neg Hx   . Diabetes Neg Hx   . COPD Neg Hx   . Obesity Mother   . Allergies Mother   . Cancer Mother   . Stroke Maternal Grandfather   . Allergies Sister   . Asthma Sister     History   Social History  . Marital Status: Legally Separated    Spouse Name: N/A    Number of Children: 2  . 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 Smoking status: Never Smoker   . Smokeless tobacco: Never Used  . Alcohol Use: 0.0 oz/week     Comment: rare  . Drug Use: No  . Sexual Activity: Not Currently    Partners: Female  Other Topics Concern  . Not on file   Social History Narrative   HSG, [employer] - 2 years. Married '99 - 50yr/divorced. 2 sons - '98, '00 split custody. 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 No history of physical or sexual abuse.      Constitutional: Denies fever, malaise, fatigue, headache or abrupt weight changes.  Respiratory: Denies difficulty breathing, shortness of breath, cough or sputum production.   Cardiovascular: Denies chest pain, chest tightness, palpitations or swelling in the hands or feet.   Neurological: Pt reports insomnia. Denies dizziness, difficulty with memory, difficulty with speech or problems with balance and coordination.  Psych: Pt reports anxiety and stress. Denies depression, SI/HI.  No other specific complaints in a complete review of systems (except as listed in HPI above).  Objective:   Physical Exam    BP 138/98  Pulse 68  Temp(Src) 98.3 F (36.8 C) (Oral)  Resp 12  Wt 214 lb (97.07 kg)  BMI 33.51 kg/m2   SpO2 97% Wt Readings from Last 3 Encounters:  04/10/13 214 lb (97.07 kg)  04/06/13 215 lb 3 oz (97.608 kg)  04/05/13 212 lb 4 oz (96.276 kg)    General: Appears his stated age, overweight but well developed, well nourished in NAD. Cardiovascular: Normal rate and rhythm. S1,S2 noted.  No murmur, rubs or gallops noted. No JVD or BLE edema. No carotid bruits noted. Pulmonary/Chest: Normal effort and positive vesicular breath sounds. No respiratory distress. No wheezes, rales or ronchi noted.  Neurological: Alert and oriented. Cranial nerves II-XII intact. Coordination normal. +DTRs bilaterally. Psychiatric: Mood anxious and affect normal. Behavior is normal. Judgment and thought content normal.     BMET    Component Value Date/Time   NA 141 04/06/2013 0208   K 2.9* 04/06/2013 0208   CL 103 04/06/2013 0208   CO2 26 03/11/2012 1054   GLUCOSE 107* 04/06/2013 0208   BUN 13 04/06/2013 0208   CREATININE 1.00 04/06/2013 0208   CALCIUM 9.1 03/11/2012 1054   GFRNONAA >90 10/13/2011 2142   GFRAA >90 10/13/2011 2142    Lipid Panel  No results found for this basename: chol, trig, hdl, cholhdl, vldl, ldlcalc    CBC    Component Value Date/Time   WBC 7.7 04/06/2013 0019   RBC 4.82 04/06/2013 0019   HGB 14.6 04/06/2013 0208   HCT 43.0 04/06/2013 0208   PLT 264 04/06/2013 0019   MCV 90.2 04/06/2013 0019   MCH 31.7 04/06/2013 0019   MCHC 35.2 04/06/2013 0019   RDW 14.3 04/06/2013 0019   LYMPHSABS 3.6 04/06/2013 0019   MONOABS 0.8 04/06/2013 0019   EOSABS 0.1 04/06/2013 0019   BASOSABS 0.0 04/06/2013 0019    Hgb A1C No results found for this basename: HGBA1C       Assessment & Plan:

## 2013-04-10 NOTE — Patient Instructions (Addendum)
Anxiety and Panic Attacks Your caregiver has informed you that you are having an anxiety or panic attack. There may be many forms of this. Most of the time these attacks come suddenly and without warning. They come at any time of day, including periods of sleep, and at any time of life. They may be strong and unexplained. Although panic attacks are very scary, they are physically harmless. Sometimes the cause of your anxiety is not known. Anxiety is a protective mechanism of the body in its fight or flight mechanism. Most of these perceived danger situations are actually nonphysical situations (such as anxiety over losing a job). CAUSES  The causes of an anxiety or panic attack are many. Panic attacks may occur in otherwise healthy people given a certain set of circumstances. There may be a genetic cause for panic attacks. Some medications may also have anxiety as a side effect. SYMPTOMS  Some of the most common feelings are:  Intense terror.  Dizziness, feeling faint.  Hot and cold flashes.  Fear of going crazy.  Feelings that nothing is real.  Sweating.  Shaking.  Chest pain or a fast heartbeat (palpitations).  Smothering, choking sensations.  Feelings of impending doom and that death is near.  Tingling of extremities, this may be from over-breathing.  Altered reality (derealization).  Being detached from yourself (depersonalization). Several symptoms can be present to make up anxiety or panic attacks. DIAGNOSIS  The evaluation by your caregiver will depend on the type of symptoms you are experiencing. The diagnosis of anxiety or panic attack is made when no physical illness can be determined to be a cause of the symptoms. TREATMENT  Treatment to prevent anxiety and panic attacks may include:  Avoidance of circumstances that cause anxiety.  Reassurance and relaxation.  Regular exercise.  Relaxation therapies, such as yoga.  Psychotherapy with a psychiatrist or  therapist.  Avoidance of caffeine, alcohol and illegal drugs.  Prescribed medication. SEEK IMMEDIATE MEDICAL CARE IF:   You experience panic attack symptoms that are different than your usual symptoms.  You have any worsening or concerning symptoms. Document Released: 06/08/2005 Document Revised: 08/31/2011 Document Reviewed: 10/10/2009 [facility] Patient Information 2014 [place], [place].

## 2013-04-10 NOTE — Assessment & Plan Note (Signed)
Paxil at max dose Increase xanax to BID Referral to therapy for CBT

## 2013-04-27 ENCOUNTER — Other Ambulatory Visit: Payer: Self-pay

## 2013-05-09 ENCOUNTER — Ambulatory Visit (INDEPENDENT_AMBULATORY_CARE_PROVIDER_SITE_OTHER): Payer: BC Managed Care – PPO | Admitting: Internal Medicine

## 2013-05-09 ENCOUNTER — Encounter: Payer: Self-pay | Admitting: Internal Medicine

## 2013-05-09 VITALS — BP 142/82 | HR 80 | Ht 68.0 in | Wt 216.2 lb

## 2013-05-09 DIAGNOSIS — F411 Generalized anxiety disorder: Secondary | ICD-10-CM

## 2013-05-09 DIAGNOSIS — F329 Major depressive disorder, single episode, unspecified: Secondary | ICD-10-CM

## 2013-05-09 DIAGNOSIS — R071 Chest pain on breathing: Secondary | ICD-10-CM

## 2013-05-09 DIAGNOSIS — R1011 Right upper quadrant pain: Secondary | ICD-10-CM

## 2013-05-09 DIAGNOSIS — Z23 Encounter for immunization: Secondary | ICD-10-CM

## 2013-05-09 DIAGNOSIS — K5080 Crohn's disease of both small and large intestine without complications: Secondary | ICD-10-CM

## 2013-05-09 DIAGNOSIS — R0789 Other chest pain: Secondary | ICD-10-CM | POA: Insufficient documentation

## 2013-05-09 MED ORDER — DICLOFENAC SODIUM 1.5 % TD SOLN: TRANSDERMAL | Status: DC

## 2013-05-09 MED ORDER — PAROXETINE HCL 20 MG PO TABS: 50.0000 mg | ORAL_TABLET | Freq: Every day | ORAL | Status: DC

## 2013-05-09 NOTE — Assessment & Plan Note (Signed)
Is at maximum dose of Peroxin seeing for anxiety though there is a 50 mg dose for PTSD reported. He is going to try 50 mg, he was reluctant to change SSRI. He will let me know how that works, he is given a prescription for 2-1/2 20 mg tablets daily on the Peroxin pain.

## 2013-05-09 NOTE — Assessment & Plan Note (Signed)
Remicade antibodies and level ? Increase dose

## 2013-05-09 NOTE — Patient Instructions (Addendum)
Today you have been given a flu vaccine.  Use a heating pad as needed and avoid exercises that strain that area.  Please come and have lab work done a few days prior to your next remicade infusion.  We have sent the following medications to your pharmacy for you to pick up at your convenience: Diclofenac Sodium, also a printed rx has been given to you for Paxil.   I appreciate the opportunity to care for you.

## 2013-05-09 NOTE — Assessment & Plan Note (Addendum)
Pennsaid, heat

## 2013-05-09 NOTE — Progress Notes (Signed)
  Subjective:    Patient ID: [name] [name], male    DOB: 1975-09-19, 37 y.o.   MRN: [id number]  HPI [name] [name] is here with several complaints. One is a right upper quadrant pain it's been there for 2 weeks generally worse with moving and twisting. Sometimes coughing and deep breathing causes it. He does not recall injury though he does have repetitive motion issues with his job. He is hoping to get a full time job but continues to work at [employer].  He was emergency department a month or so ago with what was probably a panic attack and has had Xanax prescribed. It's not helping too much. He is under quite a bit of stress and still has relations with his ex-wife. He wonders if there is something though he can take regarding his anxiety, whether he can increase the dose of his Paxil. He does not want to switch from that because he use to it and in general has worked well. He still suffers with significant insomnia.  He does know when he is due for his Remicade, and weeks for release 5 and 6 prior to getting his other dose to have increasing diarrhea. Medications, allergies, past medical history, past surgical history, family history and social history are reviewed and updated in the EMR.   Review of Systems As per history of present illness    Objective:   Physical Exam General:  NAD Eyes:   anicteric Lungs:  clear Heart:  S1S2 no rubs, murmurs or gallops Abdomen:  soft and mildly tender in the right upper quadrant and the right lower anterior and lateral ribs, pain is worse with muscle tension for sure, BS+ Ext:   no edema    Data Reviewed:  CT scan from October, chest angiogram study Lab Results  Component Value Date   WBC 7.7 04/06/2013   HGB 14.6 04/06/2013   HCT 43.0 04/06/2013   MCV 90.2 04/06/2013   PLT 264 04/06/2013     Chemistry      Component Value Date/Time   NA 141 04/06/2013 0208   K 2.9* 04/06/2013 0208   CL 103 04/06/2013 0208   CO2 26 03/11/2012 1054   BUN 13 04/06/2013 0208   CREATININE 1.00 04/06/2013 0208      Component Value Date/Time   CALCIUM 9.1 03/11/2012 1054   ALKPHOS 79 10/13/2011 2142   AST 41* 10/13/2011 2142   ALT 46 10/13/2011 2142   BILITOT 0.2* 10/13/2011 2142            Assessment & Plan:

## 2013-05-15 ENCOUNTER — Other Ambulatory Visit: Payer: Self-pay

## 2013-05-15 ENCOUNTER — Telehealth: Payer: Self-pay | Admitting: Internal Medicine

## 2013-05-15 DIAGNOSIS — K5090 Crohn's disease, unspecified, without complications: Secondary | ICD-10-CM

## 2013-05-15 MED ORDER — DICYCLOMINE HCL 20 MG PO TABS: 20.0000 mg | ORAL_TABLET | Freq: Three times a day (TID) | ORAL | Status: DC

## 2013-05-15 NOTE — Telephone Encounter (Signed)
I am not surprised - takes time Let's add dicyclomine 20 mg qAC and ahs  #120 2 refills  Give that a week and let me know

## 2013-05-15 NOTE — Telephone Encounter (Signed)
Patient advised via voicemail.  He is asked to call back for any additional questions or concerns.

## 2013-05-15 NOTE — Telephone Encounter (Signed)
Patient reports diclofenac is not helping his RUQ pain.  He is asking for an alternative tx.  Please advise

## 2013-05-16 ENCOUNTER — Other Ambulatory Visit: Payer: BC Managed Care – PPO

## 2013-05-16 DIAGNOSIS — K5080 Crohn's disease of both small and large intestine without complications: Secondary | ICD-10-CM

## 2013-05-17 ENCOUNTER — Encounter (HOSPITAL_COMMUNITY)
Admission: RE | Admit: 2013-05-17 | Discharge: 2013-05-17 | Disposition: A | Discharge status: Home / Self Care | Payer: BC Managed Care – PPO | Source: Ambulatory Visit | Attending: Internal Medicine | Admitting: Internal Medicine

## 2013-05-17 ENCOUNTER — Encounter (HOSPITAL_COMMUNITY): Payer: Self-pay

## 2013-05-17 VITALS — BP 107/54 | HR 55 | Temp 97.5°F | Resp 18 | Ht 68.0 in | Wt 216.4 lb

## 2013-05-17 DIAGNOSIS — K5090 Crohn's disease, unspecified, without complications: Secondary | ICD-10-CM | POA: Insufficient documentation

## 2013-05-17 MED ORDER — DIPHENHYDRAMINE HCL 25 MG PO CAPS
50.0000 mg | ORAL_CAPSULE | Freq: Every day | ORAL | Status: DC
  Administered 2013-05-17: 50 mg via ORAL
  Filled 2013-05-17: qty 2

## 2013-05-17 MED ORDER — SODIUM CHLORIDE 0.9 % IV SOLN
INTRAVENOUS | Status: DC
  Administered 2013-05-17: 20 mL/h via INTRAVENOUS

## 2013-05-17 MED ORDER — ACETAMINOPHEN 325 MG PO TABS
650.0000 mg | ORAL_TABLET | Freq: Every day | ORAL | Status: DC
  Administered 2013-05-17: 650 mg via ORAL
  Filled 2013-05-17: qty 2

## 2013-05-17 MED ORDER — SODIUM CHLORIDE 0.9 % IV SOLN
5.0000 mg/kg | INTRAVENOUS | Status: DC
  Administered 2013-05-17: 500 mg via INTRAVENOUS
  Filled 2013-05-17: qty 50

## 2013-05-31 ENCOUNTER — Encounter (HOSPITAL_COMMUNITY): Payer: BC Managed Care – PPO

## 2013-06-01 ENCOUNTER — Encounter: Payer: Self-pay | Admitting: Internal Medicine

## 2013-06-01 LAB — INFLIXIMAB (IFX) CONC+ IFX AB
Anti-Infliximab Antibody: 37 ng/mL — ABNORMAL HIGH
Infliximab Drug Level: 9.1 ug/mL

## 2013-06-02 ENCOUNTER — Ambulatory Visit (INDEPENDENT_AMBULATORY_CARE_PROVIDER_SITE_OTHER): Payer: BC Managed Care – PPO | Admitting: Nurse Practitioner

## 2013-06-02 ENCOUNTER — Encounter: Payer: Self-pay | Admitting: Nurse Practitioner

## 2013-06-02 VITALS — BP 110/67 | HR 76 | Ht 68.0 in | Wt 207.4 lb

## 2013-06-02 DIAGNOSIS — F411 Generalized anxiety disorder: Secondary | ICD-10-CM

## 2013-06-02 DIAGNOSIS — R634 Abnormal weight loss: Secondary | ICD-10-CM

## 2013-06-02 DIAGNOSIS — F439 Reaction to severe stress, unspecified: Secondary | ICD-10-CM

## 2013-06-02 DIAGNOSIS — Z733 Stress, not elsewhere classified: Secondary | ICD-10-CM

## 2013-06-02 DIAGNOSIS — R112 Nausea with vomiting, unspecified: Secondary | ICD-10-CM

## 2013-06-02 MED ORDER — ONDANSETRON 8 MG PO TBDP: 8.0000 mg | ORAL_TABLET | Freq: Three times a day (TID) | ORAL | Status: DC | PRN

## 2013-06-02 MED ORDER — DICYCLOMINE HCL 20 MG PO TABS: 20.0000 mg | ORAL_TABLET | Freq: Three times a day (TID) | ORAL | Status: DC

## 2013-06-02 MED ORDER — ALPRAZOLAM 1 MG PO TABS: 1.0000 mg | ORAL_TABLET | Freq: Two times a day (BID) | ORAL | Status: DC | PRN

## 2013-06-02 NOTE — Progress Notes (Signed)
     History of Present Illness:  Patient is a 37 year old male well known to Dr. Carlean Purl for a history of ileocolonic Crohn's disease, s/p right hemicolectomy . He is maintained on Remicade.  Patient saw [place] 11/18 with anxiety, chest wall pain, and RUQ pain.  Patient felt to have right chest wall pain and was prescribed Diclofenac gel which helped to some degree. We also gave me Bentyl which he is taking but not sure if it is helpful. His PCP increased Paxil to 42m daily, which has helped anxiety. Patient here today became of fatigue, weight loss, nausea and vomiting.  His weight is down 11 pounds since 05/09/13. Patient unable to hold anything down except soda which he tells me has been going on for about two months though I don't see mention of it in 05/09/13 visit. Mornings are the worst as he vomits even prior to eating anything. He is either freezing or sweating but hasn't checked temp. Last dose of Remicade was day before Thanksgiving.    Current Medications, Allergies, Past Medical History, Past Surgical History, Family History and Social History were reviewed in [employer].   Physical Exam: General: Pleasant, well developed , white male in no acute distress Head: Normocephalic and atraumatic Eyes:  sclerae anicteric, conjunctiva pink  Ears: Normal auditory acuity Lungs: Clear throughout to auscultation Heart: Regular rate and rhythm Abdomen: Soft, non distended, non-tender. No masses, no hepatomegaly. Normal bowel sounds Musculoskeletal: Symmetrical with no gross deformities  Extremities: No edema  Neurological: Alert oriented x 4, grossly nonfocal Psychological:  Alert and cooperative. Normal mood and affect  Assessment and Recommendations:  143 37year old male well known to Dr. GCarlean Purlfor a history of ileocolonic Crohn's disease, s/p right hemicolectomy . He is maintained on Remicade.   2. nausea, vomiting, 11 pound weight loss in less than one  month. Etiology not clear. Not having significant abdominal pain, bowel movements are at baseline . For further regulation patient will be be scheduled for upper endoscopy to be done on Monday.The benefits, risks, and potential complications of EGD with possible biopsies were discussed with the patient and he agrees to proceed.  For nausea, patient has Phenergan but it causes drowsiness. Will try Zofran ODT.

## 2013-06-02 NOTE — Patient Instructions (Signed)

## 2013-06-02 NOTE — Telephone Encounter (Signed)
I have left a message for the patient to call back to be seen today.

## 2013-06-05 ENCOUNTER — Other Ambulatory Visit: Payer: Self-pay

## 2013-06-05 ENCOUNTER — Ambulatory Visit (AMBULATORY_SURGERY_CENTER): Payer: BC Managed Care – PPO | Admitting: Internal Medicine

## 2013-06-05 ENCOUNTER — Other Ambulatory Visit (INDEPENDENT_AMBULATORY_CARE_PROVIDER_SITE_OTHER): Payer: BC Managed Care – PPO

## 2013-06-05 ENCOUNTER — Encounter: Payer: Self-pay | Admitting: Internal Medicine

## 2013-06-05 VITALS — BP 132/76 | HR 51 | Temp 98.0°F | Resp 43 | Ht 68.0 in | Wt 207.0 lb

## 2013-06-05 DIAGNOSIS — K5080 Crohn's disease of both small and large intestine without complications: Secondary | ICD-10-CM

## 2013-06-05 DIAGNOSIS — K5090 Crohn's disease, unspecified, without complications: Secondary | ICD-10-CM

## 2013-06-05 DIAGNOSIS — R112 Nausea with vomiting, unspecified: Secondary | ICD-10-CM

## 2013-06-05 DIAGNOSIS — R634 Abnormal weight loss: Secondary | ICD-10-CM

## 2013-06-05 LAB — COMPREHENSIVE METABOLIC PANEL
ALT: 13 U/L (ref 0–53)
Albumin: 4 g/dL (ref 3.5–5.2)
Alkaline Phosphatase: 53 U/L (ref 39–117)
BUN: 9 mg/dL (ref 6–23)
CO2: 24 mEq/L (ref 19–32)
Calcium: 8.5 mg/dL (ref 8.4–10.5)
Glucose, Bld: 91 mg/dL (ref 70–99)
Potassium: 3.6 mEq/L (ref 3.5–5.1)
Sodium: 138 mEq/L (ref 135–145)
Total Bilirubin: 0.5 mg/dL (ref 0.3–1.2)
Total Protein: 6.7 g/dL (ref 6.0–8.3)

## 2013-06-05 LAB — CBC WITH DIFFERENTIAL/PLATELET
Basophils Relative: 0.5 % (ref 0.0–3.0)
Eosinophils Relative: 0.9 % (ref 0.0–5.0)
HCT: 39.2 % (ref 39.0–52.0)
Lymphs Abs: 2.1 10*3/uL (ref 0.7–4.0)
MCHC: 33.7 g/dL (ref 30.0–36.0)
MCV: 92.1 fl (ref 78.0–100.0)
Monocytes Absolute: 0.4 10*3/uL (ref 0.1–1.0)
Monocytes Relative: 8.5 % (ref 3.0–12.0)
Neutrophils Relative %: 47.9 % (ref 43.0–77.0)
Platelets: 194 10*3/uL (ref 150.0–400.0)
RBC: 4.26 Mil/uL (ref 4.22–5.81)
WBC: 5.1 10*3/uL (ref 4.5–10.5)

## 2013-06-05 LAB — LIPASE: Lipase: 25 U/L (ref 11.0–59.0)

## 2013-06-05 LAB — AMYLASE: Amylase: 34 U/L (ref 27–131)

## 2013-06-05 MED ORDER — SODIUM CHLORIDE 0.9 % IV SOLN: 500.0000 mL | INTRAVENOUS | Status: DC

## 2013-06-05 NOTE — Progress Notes (Signed)
Report to pacu rn, vss, bbs=clear 

## 2013-06-05 NOTE — Progress Notes (Signed)
Per EGD report 06/05/13 patient needs a CT abdomen and pelvis.  Patient is scheduled for 06/07/13 1:00 at 11:[address]    Left message for patient to call back     I have attempted several times to reach the patient.  I have sent him a my chart message and left him a detailed message on his voicemail.  He is asked to call back for any additional questions or concerns

## 2013-06-05 NOTE — Progress Notes (Signed)
Patient did not experience any of the following events: a burn prior to discharge; a fall within the facility; wrong site/side/patient/procedure/implant event; or a hospital transfer or hospital admission upon discharge from the facility. (G8907) Patient did not have preoperative order for IV antibiotic SSI prophylaxis. (G8918)  

## 2013-06-05 NOTE — Patient Instructions (Addendum)
The esophagus, stomach and small intestine look ok. I will check labs today and we will schedule a CT scan of the abdomen and pelvis to investigate further.  Will call with results.  I appreciate the opportunity to care for you. Gatha Mayer, MD, FACG   YOU HAD AN ENDOSCOPIC PROCEDURE TODAY AT [facility]: Refer to the procedure report that was given to you for any specific questions about what was found during the examination.  If the procedure report does not answer your questions, please call your gastroenterologist to clarify.  If you requested that your care partner not be given the details of your procedure findings, then the procedure report has been included in a sealed envelope for you to review at your convenience later.  YOU SHOULD EXPECT: Some feelings of bloating in the abdomen. Passage of more gas than usual.  Walking can help get rid of the air that was put into your GI tract during the procedure and reduce the bloating. If you had a lower endoscopy (such as a colonoscopy or flexible sigmoidoscopy) you may notice spotting of blood in your stool or on the toilet paper. If you underwent a bowel prep for your procedure, then you may not have a normal bowel movement for a few days.  DIET: Your first meal following the procedure should be a light meal and then it is ok to progress to your normal diet.  A half-sandwich or bowl of soup is an example of a good first meal.  Heavy or fried foods are harder to digest and may make you feel nauseous or bloated.  Likewise meals heavy in dairy and vegetables can cause extra gas to form and this can also increase the bloating.  Drink plenty of fluids but you should avoid alcoholic beverages for 24 hours.  ACTIVITY: Your care partner should take you home directly after the procedure.  You should plan to take it easy, moving slowly for the rest of the day.  You can resume normal activity the day after the procedure however you should  NOT DRIVE or use heavy machinery for 24 hours (because of the sedation medicines used during the test).    SYMPTOMS TO REPORT IMMEDIATELY: A gastroenterologist can be reached at any hour.  During normal business hours, 8:30 AM to 5:00 PM Monday through Friday, call [phone].  After hours and on weekends, please call the GI answering service at [phone] who will take a message and have the physician on call contact you.     Following upper endoscopy (EGD)  Vomiting of blood or coffee ground material  New chest pain or pain under the shoulder blades  Painful or persistently difficult swallowing  New shortness of breath  Fever of 100F or higher  Black, tarry-looking stools  FOLLOW UP: If any biopsies were taken you will be contacted by phone or by letter within the next 1-3 weeks.  Call your gastroenterologist if you have not heard about the biopsies in 3 weeks.  Our staff will call the home number listed on your records the next business day following your procedure to check on you and address any questions or concerns that you may have at that time regarding the information given to you following your procedure. This is a courtesy call and so if there is no answer at the home number and we have not heard from you through the emergency physician on call, we will assume that you have returned to  your regular daily activities without incident.  SIGNATURES/CONFIDENTIALITY: You and/or your care partner have signed paperwork which will be entered into your electronic medical record.  These signatures attest to the fact that that the information above on your After Visit Summary has been reviewed and is understood.  Full responsibility of the confidentiality of this discharge information lies with you and/or your care-partner.   You will be taken to lab on way out today for labwork ordered by Dr Burnard Hawthorne given to you for CT ordered by Dr Carlean Purl,     Dr Celesta Aver nurse will  be calling you with details time and date of CT

## 2013-06-05 NOTE — Progress Notes (Signed)
Agree with Ms. Guenther's assessment and plan. Gatha Mayer, MD, [facility]

## 2013-06-05 NOTE — Op Note (Signed)
[place]  [address]. [place], [address]   ENDOSCOPY PROCEDURE REPORT  PATIENT: [name] [name], [name] [name]  MR#: [id number] BIRTHDATE: Jul 20, 1975 , 37  yrs. old GENDER: Male ENDOSCOPIST: Gatha Mayer, MD, [facility] PROCEDURE DATE:  06/05/2013 PROCEDURE:  EGD, diagnostic ASA CLASS:     Class II INDICATIONS:  Nausea.   Vomiting.  Crohn's disease MEDICATIONS: propofol (Diprivan) 158m IV, MAC sedation, administered by CRNA, and These medications were titrated to patient response per physician's verbal order TOPICAL ANESTHETIC: Cetacaine Spray  DESCRIPTION OF PROCEDURE: After the risks benefits and alternatives of the procedure were thoroughly explained, informed consent was obtained.  The LB [id number] was introduced through the mouth and advanced to the second portion of the duodenum. Without limitations.  The instrument was slowly withdrawn as the mucosa was fully examined.      The upper, middle and distal third of the esophagus were carefully inspected and no abnormalities were noted.  The z-line was well seen at the GEJ.  The endoscope was pushed into the fundus which was normal including a retroflexed view.  The antrum, gastric body, first and second part of the duodenum were unremarkable. Retroflexed views revealed no abnormalities.     The scope was then withdrawn from the patient and the procedure completed.  COMPLICATIONS: There were no complications. ENDOSCOPIC IMPRESSION: Normal EGD  RECOMMENDATIONS: CBC, CMET, lipase, amylase and CRP today (ordered) Will also schedule CT abd/pelvis with contrast to evaluate nauea/vomiting, weight loss and Crohn's   eSigned:  CGatha Mayer MD, [facility]12/15/2014 9:57 AM   CC:The Patient

## 2013-06-06 ENCOUNTER — Telehealth: Payer: Self-pay | Admitting: *Deleted

## 2013-06-06 NOTE — Telephone Encounter (Signed)
Number identifier,Left message, follow-up

## 2013-06-07 ENCOUNTER — Ambulatory Visit (INDEPENDENT_AMBULATORY_CARE_PROVIDER_SITE_OTHER)
Admission: RE | Admit: 2013-06-07 | Discharge: 2013-06-07 | Disposition: A | Discharge status: Home / Self Care | Payer: BC Managed Care – PPO | Source: Ambulatory Visit | Attending: Internal Medicine | Admitting: Internal Medicine

## 2013-06-07 DIAGNOSIS — R634 Abnormal weight loss: Secondary | ICD-10-CM

## 2013-06-07 DIAGNOSIS — K5090 Crohn's disease, unspecified, without complications: Secondary | ICD-10-CM

## 2013-06-07 DIAGNOSIS — R112 Nausea with vomiting, unspecified: Secondary | ICD-10-CM

## 2013-06-08 ENCOUNTER — Encounter: Payer: Self-pay | Admitting: Internal Medicine

## 2013-06-09 ENCOUNTER — Telehealth: Payer: Self-pay | Admitting: Internal Medicine

## 2013-06-09 DIAGNOSIS — F329 Major depressive disorder, single episode, unspecified: Secondary | ICD-10-CM

## 2013-06-09 DIAGNOSIS — F32A Depression, unspecified: Secondary | ICD-10-CM

## 2013-06-09 MED ORDER — PAROXETINE HCL 20 MG PO TABS: 40.0000 mg | ORAL_TABLET | Freq: Every day | ORAL | Status: DC

## 2013-06-09 MED ORDER — PREDNISONE 10 MG PO TABS: 40.0000 mg | ORAL_TABLET | Freq: Every day | ORAL | Status: DC

## 2013-06-09 MED ORDER — MIRTAZAPINE 15 MG PO TBDP: 15.0000 mg | ORAL_TABLET | Freq: Every day | ORAL | Status: DC

## 2013-06-09 NOTE — Telephone Encounter (Signed)
Vale is still struggling with a lot of nausea and vomiting and only tolerating liquids. CT scanning upper GI endoscopy and labs a been unrevealing. He remains under a great deal of stress. He is sleeping somewhat.  I explained its not clear whether this is all stress related or his Crohn's or both. 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 We're going to need to change his Remicade, as it is ineffective at this point.  For the time being we are going to do the following:  #1 prednisone 40 mg daily for a week and then reduce to 30 mg daily and contact me with an update.  #2 mirtazapine 15 mg at bedtime  #3 reduce Paxil to 40 mg daily at bedtime  #4 discontinue infliximab  #5 he will need an appointment to see me in January, my nurse will call him to arrange

## 2013-06-09 NOTE — Progress Notes (Signed)
Quick Note:  Results called See phone note ______

## 2013-06-12 NOTE — Telephone Encounter (Signed)
I have updated his med list and cancelled future appts with Short stay.   I spoke with Junious Dresser I have left a message for Yeray to call back to schedule an office visit in January

## 2013-06-12 NOTE — Addendum Note (Signed)
Addended by: Marlon Pel on: 06/12/2013 10:30 AM   Modules accepted: Orders, Medications

## 2013-06-19 NOTE — Telephone Encounter (Signed)
Patient is scheduled for 06/30/13 2:15.  He is aware of the appt date and time

## 2013-06-19 NOTE — Telephone Encounter (Signed)
Left message for patient to call back  

## 2013-06-27 ENCOUNTER — Encounter: Payer: Self-pay | Admitting: Internal Medicine

## 2013-06-27 ENCOUNTER — Ambulatory Visit (INDEPENDENT_AMBULATORY_CARE_PROVIDER_SITE_OTHER): Payer: BC Managed Care – PPO | Admitting: Internal Medicine

## 2013-06-27 VITALS — BP 112/80 | HR 66 | Temp 98.4°F | Resp 16 | Ht 68.0 in | Wt 211.8 lb

## 2013-06-27 DIAGNOSIS — B353 Tinea pedis: Secondary | ICD-10-CM | POA: Insufficient documentation

## 2013-06-27 MED ORDER — CICLOPIROX OLAMINE 0.77 % EX CREA: TOPICAL_CREAM | Freq: Two times a day (BID) | CUTANEOUS | Status: DC

## 2013-06-27 NOTE — Assessment & Plan Note (Signed)
I have asked him to stop the lamisil spray and to start using loprox

## 2013-06-27 NOTE — Progress Notes (Signed)
Pre visit review using our clinic review tool, if applicable. No additional management support is needed unless otherwise documented below in the visit note. 

## 2013-06-27 NOTE — Patient Instructions (Signed)
Athlete's Foot Athlete's foot (tinea pedis) is a fungal infection of the skin on the feet. It often occurs on the skin between the toes or underneath the toes. It can also occur on the soles of the feet. Athlete's foot is more likely to occur in hot, humid weather. Not washing your feet or changing your socks often enough can contribute to athlete's foot. The infection can spread from person to person (contagious). CAUSES Athlete's foot is caused by a fungus. This fungus thrives in warm, moist places. Most people get athlete's foot by sharing shower stalls, towels, and wet floors with an infected person. People with weakened immune systems, including those with diabetes, may be more likely to get athlete's foot. SYMPTOMS   Itchy areas between the toes or on the soles of the feet.  White, flaky, or scaly areas between the toes or on the soles of the feet.  Tiny, intensely itchy blisters between the toes or on the soles of the feet.  Tiny cuts on the skin. These cuts can develop a bacterial infection.  Thick or discolored toenails. DIAGNOSIS  Your caregiver can usually tell what the problem is by doing a physical exam. Your caregiver may also take a skin sample from the rash area. The skin sample may be examined under a microscope, or it may be tested to see if fungus will grow in the sample. A sample may also be taken from your toenail for testing. TREATMENT  Over-the-counter and prescription medicines can be used to kill the fungus. These medicines are available as powders or creams. Your caregiver can suggest medicines for you. Fungal infections respond slowly to treatment. You may need to continue using your medicine for several weeks. PREVENTION   Do not share towels.  Wear sandals in wet areas, such as shared locker rooms and shared showers.  Keep your feet dry. Wear shoes that allow air to circulate. Wear cotton or wool socks. HOME CARE INSTRUCTIONS   Take medicines as directed by  your caregiver. Do not use steroid creams on athlete's foot.  Keep your feet clean and cool. Wash your feet daily and dry them thoroughly, especially between your toes.  Change your socks every day. Wear cotton or wool socks. In hot climates, you may need to change your socks 2 to 3 times per day.  Wear sandals or canvas tennis shoes with good air circulation.  If you have blisters, soak your feet in Burow's solution or Epsom salts for 20 to 30 minutes, 2 times a day to dry out the blisters. Make sure you dry your feet thoroughly afterward. SEEK MEDICAL CARE IF:   You have a fever.  You have swelling, soreness, warmth, or redness in your foot.  You are not getting better after 7 days of treatment.  You are not completely cured after 30 days.  You have any problems caused by your medicines. MAKE SURE YOU:   Understand these instructions.  Will watch your condition.  Will get help right away if you are not doing well or get worse. Document Released: 06/05/2000 Document Revised: 08/31/2011 Document Reviewed: 03/27/2011 [facility] Patient Information 2014 [place], [place].

## 2013-06-27 NOTE — Progress Notes (Signed)
   Subjective:    Patient ID: [name] [name], male    DOB: 10/19/1975, 38 y.o.   MRN: [id number]  Rash This is a recurrent problem. The current episode started 1 to 4 weeks ago. The problem is unchanged. The affected locations include the left foot and right foot. The rash is characterized by blistering, itchiness, dryness and scaling. He was exposed to nothing. Pertinent negatives include no anorexia, congestion, cough, diarrhea, eye pain, facial edema, fatigue, fever, joint pain, nail changes, rhinorrhea, shortness of breath, sore throat or vomiting. Treatments tried: lamisil spray. The treatment provided mild relief.      Review of Systems  Constitutional: Negative.  Negative for fever and fatigue.  HENT: Negative.  Negative for congestion, rhinorrhea and sore throat.   Eyes: Negative.  Negative for pain.  Respiratory: Negative.  Negative for cough and shortness of breath.   Cardiovascular: Negative.   Gastrointestinal: Negative.  Negative for vomiting, diarrhea and anorexia.  Endocrine: Negative.   Genitourinary: Negative.   Musculoskeletal: Negative.  Negative for joint pain.  Skin: Positive for rash. Negative for nail changes.  Allergic/Immunologic: Positive for immunocompromised state. Negative for environmental allergies and food allergies.  Neurological: Negative.   Hematological: Negative.  Negative for adenopathy. Does not bruise/bleed easily.  Psychiatric/Behavioral: Negative.   All other systems reviewed and are negative.       Objective:   Physical Exam  Vitals reviewed. Constitutional: He is oriented to person, place, and time. He appears well-developed and well-nourished. No distress.  HENT:  Head: Normocephalic and atraumatic.  Mouth/Throat: Oropharynx is clear and moist. No oropharyngeal exudate.  Eyes: Conjunctivae are normal. Right eye exhibits no discharge. Left eye exhibits no discharge. No scleral icterus.  Neck: Normal range of motion. Neck supple. No JVD  present. No tracheal deviation present. No thyromegaly present.  Cardiovascular: Normal rate, regular rhythm, normal heart sounds and intact distal pulses.  Exam reveals no gallop and no friction rub.   No murmur heard. Pulmonary/Chest: Effort normal and breath sounds normal. No stridor. No respiratory distress. He has no wheezes. He has no rales. He exhibits no tenderness.  Abdominal: Soft. Bowel sounds are normal. He exhibits no distension and no mass. There is no tenderness. There is no rebound and no guarding.  Musculoskeletal: He exhibits no edema and no tenderness.  Lymphadenopathy:    He has no cervical adenopathy.  Neurological: He is oriented to person, place, and time.  Skin: Skin is warm and dry. Rash noted. No purpura noted. Rash is not macular, not papular, not maculopapular, not nodular, not pustular, not vesicular and not urticarial. He is not diaphoretic. No erythema. No pallor.  In the 3rd and 4th web spaces of both feet there are some pale blisters and there is maceration, scaling, fissuring. There is no warmth, erythema, exudate, induration, fluctuance, or streaking.     Lab Results  Component Value Date   WBC 5.1 06/05/2013   HGB 13.2 06/05/2013   HCT 39.2 06/05/2013   PLT 194.0 06/05/2013   GLUCOSE 91 06/05/2013   ALT 13 06/05/2013   AST 17 06/05/2013   NA 138 06/05/2013   K 3.6 06/05/2013   CL 107 06/05/2013   CREATININE 0.8 06/05/2013   BUN 9 06/05/2013   CO2 24 06/05/2013   TSH 2.42 04/10/2011        Assessment & Plan:

## 2013-06-28 ENCOUNTER — Encounter (HOSPITAL_COMMUNITY): Payer: BC Managed Care – PPO

## 2013-06-30 ENCOUNTER — Other Ambulatory Visit: Payer: BC Managed Care – PPO

## 2013-06-30 ENCOUNTER — Ambulatory Visit (INDEPENDENT_AMBULATORY_CARE_PROVIDER_SITE_OTHER): Payer: BC Managed Care – PPO | Admitting: Internal Medicine

## 2013-06-30 ENCOUNTER — Encounter: Payer: Self-pay | Admitting: Internal Medicine

## 2013-06-30 VITALS — BP 118/72 | HR 108 | Ht 67.0 in | Wt 208.1 lb

## 2013-06-30 DIAGNOSIS — K5080 Crohn's disease of both small and large intestine without complications: Secondary | ICD-10-CM

## 2013-06-30 DIAGNOSIS — R112 Nausea with vomiting, unspecified: Secondary | ICD-10-CM

## 2013-06-30 DIAGNOSIS — F411 Generalized anxiety disorder: Secondary | ICD-10-CM

## 2013-06-30 DIAGNOSIS — D899 Disorder involving the immune mechanism, unspecified: Secondary | ICD-10-CM

## 2013-06-30 DIAGNOSIS — D849 Immunodeficiency, unspecified: Secondary | ICD-10-CM

## 2013-06-30 MED ORDER — PREDNISONE 5 MG PO TABS: ORAL_TABLET | ORAL | Status: DC

## 2013-06-30 MED ORDER — ADALIMUMAB 40 MG/0.8ML ~~LOC~~ KIT: 40.0000 mg | PACK | Freq: Once | SUBCUTANEOUS | Status: DC

## 2013-06-30 MED ORDER — ADALIMUMAB 40 MG/0.8ML ~~LOC~~ KIT: 40.0000 mg | PACK | SUBCUTANEOUS | Status: DC

## 2013-06-30 NOTE — Assessment & Plan Note (Addendum)
Improved on current regimen of prednisone and I think the starting around or on. That has helped his sleep as well.

## 2013-06-30 NOTE — Assessment & Plan Note (Addendum)
Improved on the Remeron.

## 2013-06-30 NOTE — Patient Instructions (Addendum)
Your physician has requested that you go to the basement for lab work before leaving today.  We have sent the following medications to your pharmacy for you to pick up at your convenience: Prednisone  We are going to send in a Humira starter Kit for you.  We will be in touch about this.  Follow up with [place] in 3 months.  I appreciate the opportunity to care for you.

## 2013-06-30 NOTE — Assessment & Plan Note (Addendum)
Start Humira. Rationale explained. Will taper prednisone.

## 2013-06-30 NOTE — Progress Notes (Signed)
         Subjective:    Patient ID: [name] [name], male    DOB: 1975/08/13, 38 y.o.   MRN: [id number]  HPI The patient is here for followup of his Crohn's ileitis and colitis. In recent time she's had a lot of nausea and vomiting and a workup with EGD and CT scanning and labs was unrevealing. He was started on prednisone and Remeron. He is much better overall though he says he feels like she needs has biologic agents. He has developed antibodies to infliximab.  He is having some chronic diarrhea issues. There is less abdominal pain and he is not really vomiting anymore. He is sleeping much better.  He is concerned about the possibility Of having contracted HIV as he had an unprotected intercourse with an HIV-positive person about a month ago. He is requesting testing.  Medications, allergies, past medical history, past surgical history, family history and social history are reviewed and updated in the EMR.  Review of Systems As above, recent treatment for athlete's foot    Objective:   Physical Exam General:  NAD Eyes:   anicteric Lungs:  clear Heart:  S1S2 no rubs, murmurs or gallops Abdomen:  soft and nontender, BS+ Ext:   no edema    Data Reviewed:  Wt Readings from Last 3 Encounters:  06/30/13 208 lb 2 oz (94.405 kg)  06/27/13 211 lb 12 oz (96.049 kg)  06/05/13 207 lb (93.895 kg)      Assessment & Plan:   1. Regional enteritis of small intestine with large intestine   2. Nausea with vomiting   3. ANXIETY   4. Immunosuppression    Please see problem oriented charting. Will also check HIV antibody reflex. I reminded him that he should practice safe sex and use barrier protection.

## 2013-07-01 LAB — HIV ANTIBODY (ROUTINE TESTING W REFLEX): HIV: NONREACTIVE

## 2013-07-02 ENCOUNTER — Encounter: Payer: Self-pay | Admitting: Internal Medicine

## 2013-07-03 ENCOUNTER — Telehealth: Payer: Self-pay | Admitting: Internal Medicine

## 2013-07-03 NOTE — Telephone Encounter (Signed)
Instructions given to [name] [name] at [employer]

## 2013-07-04 ENCOUNTER — Encounter: Payer: Self-pay | Admitting: Internal Medicine

## 2013-07-04 NOTE — Telephone Encounter (Signed)
Spoke with pt, he states he can get the Humira pen today and he did hear from [place] with Humira. Pt states he was under the impression that he was to bring the first does in the office and he would be shown how to do the first injection. Instructed pt to refrigerate the med when he got in and to plan on coming in the office on Wednesday for assistance.

## 2013-07-06 ENCOUNTER — Telehealth: Payer: Self-pay | Admitting: Internal Medicine

## 2013-07-07 NOTE — Telephone Encounter (Signed)
Left message for patient to call back  

## 2013-07-07 NOTE — Telephone Encounter (Signed)
I spoke with Humira services and they have the request for nursing services.  The patient has the medication and ready to start.  [facility] states they will contact the home health agency to get the teaching scheduled.  He will meet with the Humira Ambassador tonight according to [place].  He will call back for any additional questions or concerns

## 2013-07-17 ENCOUNTER — Encounter: Payer: Self-pay | Admitting: Internal Medicine

## 2013-07-20 ENCOUNTER — Telehealth: Payer: Self-pay | Admitting: Internal Medicine

## 2013-07-20 NOTE — Telephone Encounter (Signed)
Patient will come and pick up the letter from the front desk

## 2013-07-27 ENCOUNTER — Telehealth: Payer: Self-pay | Admitting: Internal Medicine

## 2013-07-27 NOTE — Telephone Encounter (Signed)
Needs a letter faxed to excuse him from work from Dec 12-Dec 28th.  He also needs a work note for 07/24/13-07/25/13.  He needs both faxed to [phone].  I have left a voicemail with Satish to call back and clarify Dec dates.

## 2013-07-28 ENCOUNTER — Encounter: Payer: Self-pay | Admitting: Physician Assistant

## 2013-07-28 ENCOUNTER — Ambulatory Visit (INDEPENDENT_AMBULATORY_CARE_PROVIDER_SITE_OTHER): Payer: BC Managed Care – PPO | Admitting: Physician Assistant

## 2013-07-28 ENCOUNTER — Encounter: Payer: Self-pay | Admitting: Internal Medicine

## 2013-07-28 VITALS — BP 126/88 | HR 80 | Temp 98.0°F | Resp 18 | Ht 69.0 in | Wt 217.5 lb

## 2013-07-28 DIAGNOSIS — G4700 Insomnia, unspecified: Secondary | ICD-10-CM

## 2013-07-28 DIAGNOSIS — M79671 Pain in right foot: Secondary | ICD-10-CM

## 2013-07-28 DIAGNOSIS — F411 Generalized anxiety disorder: Secondary | ICD-10-CM

## 2013-07-28 DIAGNOSIS — Z7721 Contact with and (suspected) exposure to potentially hazardous body fluids: Secondary | ICD-10-CM

## 2013-07-28 DIAGNOSIS — Z23 Encounter for immunization: Secondary | ICD-10-CM

## 2013-07-28 DIAGNOSIS — F439 Reaction to severe stress, unspecified: Secondary | ICD-10-CM

## 2013-07-28 DIAGNOSIS — Z733 Stress, not elsewhere classified: Secondary | ICD-10-CM

## 2013-07-28 DIAGNOSIS — M79609 Pain in unspecified limb: Secondary | ICD-10-CM

## 2013-07-28 MED ORDER — EMTRICITABINE-TENOFOVIR DF 200-300 MG PO TABS: 1.0000 | ORAL_TABLET | Freq: Every day | ORAL | Status: DC

## 2013-07-28 MED ORDER — ALPRAZOLAM 1 MG PO TABS: 1.0000 mg | ORAL_TABLET | Freq: Two times a day (BID) | ORAL | Status: DC | PRN

## 2013-07-28 MED ORDER — ZOLPIDEM TARTRATE 5 MG PO TABS: 5.0000 mg | ORAL_TABLET | Freq: Every evening | ORAL | Status: DC | PRN

## 2013-07-28 NOTE — Patient Instructions (Signed)
It was great meeting you today Mr. Alicea!      Generalized Anxiety Disorder Generalized anxiety disorder (GAD) is a mental disorder. It interferes with life functions, including relationships, work, and school. GAD is different from normal anxiety, which everyone experiences at some point in their lives in response to specific life events and activities. Normal anxiety actually helps [place] prepare for and get through these life events and activities. Normal anxiety goes away after the event or activity is over.  GAD causes anxiety that is not necessarily related to specific events or activities. It also causes excess anxiety in proportion to specific events or activities. The anxiety associated with GAD is also difficult to control. GAD can vary from mild to severe. People with severe GAD can have intense waves of anxiety with physical symptoms (panic attacks).  SYMPTOMS The anxiety and worry associated with GAD are difficult to control. This anxiety and worry are related to many life events and activities and also occur more days than not for 6 months or longer. People with GAD also have three or more of the following symptoms (one or more in children):  Restlessness.   Fatigue.  Difficulty concentrating.   Irritability.  Muscle tension.  Difficulty sleeping or unsatisfying sleep. DIAGNOSIS GAD is diagnosed through an assessment by your caregiver. Your caregiver will ask you questions aboutyour mood,physical symptoms, and events in your life. Your caregiver may ask you about your medical history and use of alcohol or drugs, including prescription medications. Your caregiver may also do a physical exam and blood tests. Certain medical conditions and the use of certain substances can cause symptoms similar to those associated with GAD. Your caregiver may refer you to a mental health specialist for further evaluation. TREATMENT The following therapies are usually used to treat GAD:    Medication Antidepressant medication usually is prescribed for long-term daily control. Antianxiety medications may be added in severe cases, especially when panic attacks occur.   Talk therapy (psychotherapy) Certain types of talk therapy can be helpful in treating GAD by providing support, education, and guidance. A form of talk therapy called cognitive behavioral therapy can teach you healthy ways to think about and react to daily life events and activities.  Stress managementtechniques These include yoga, meditation, and exercise and can be very helpful when they are practiced regularly. A mental health specialist can help determine which treatment is best for you. Some people see improvement with one therapy. However, other people require a combination of therapies. Document Released: 10/03/2012 Document Reviewed: 10/03/2012 [facility] Patient Information 2014 [place], [place].    Insomnia Insomnia is frequent trouble falling and/or staying asleep. Insomnia can be a long term problem or a short term problem. Both are common. Insomnia can be a short term problem when the wakefulness is related to a certain stress or worry. Long term insomnia is often related to ongoing stress during waking hours and/or poor sleeping habits. Overtime, sleep deprivation itself can make the problem worse. Every little thing feels more severe because you are overtired and your ability to cope is decreased. CAUSES   Stress, anxiety, and depression.  Poor sleeping habits.  Distractions such as TV in the bedroom.  Naps close to bedtime.  Engaging in emotionally charged conversations before bed.  Technical reading before sleep.  Alcohol and other sedatives. They may make the problem worse. They can hurt normal sleep patterns and normal dream activity.  Stimulants such as caffeine for several hours prior to bedtime.  Pain syndromes  and shortness of breath can cause insomnia.  Exercise late at  night.  Changing time zones may cause sleeping problems (jet lag). It is sometimes helpful to have someone observe your sleeping patterns. They should look for periods of not breathing during the night (sleep apnea). They should also look to see how long those periods last. If you live alone or observers are uncertain, you can also be observed at a sleep clinic where your sleep patterns will be professionally monitored. Sleep apnea requires a checkup and treatment. Give your caregivers your medical history. Give your caregivers observations your family has made about your sleep.  SYMPTOMS   Not feeling rested in the morning.  Anxiety and restlessness at bedtime.  Difficulty falling and staying asleep. TREATMENT   Your caregiver may prescribe treatment for an underlying medical disorders. Your caregiver can give advice or help if you are using alcohol or other drugs for self-medication. Treatment of underlying problems will usually eliminate insomnia problems.  Medications can be prescribed for short time use. They are generally not recommended for lengthy use.  Over-the-counter sleep medicines are not recommended for lengthy use. They can be habit forming.  You can promote easier sleeping by making lifestyle changes such as:  Using relaxation techniques that help with breathing and reduce muscle tension.  Exercising earlier in the day.  Changing your diet and the time of your last meal. No night time snacks.  Establish a regular time to go to bed.  Counseling can help with stressful problems and worry.  Soothing music and white noise may be helpful if there are background noises you cannot remove.  Stop tedious detailed work at least one hour before bedtime. HOME CARE INSTRUCTIONS   Keep a diary. Inform your caregiver about your progress. This includes any medication side effects. See your caregiver regularly. Take note of:  Times when you are asleep.  Times when you are awake  during the night.  The quality of your sleep.  How you feel the next day. This information will help your caregiver care for you.  Get out of bed if you are still awake after 15 minutes. Read or do some quiet activity. Keep the lights down. Wait until you feel sleepy and go back to bed.  Keep regular sleeping and waking hours. Avoid naps.  Exercise regularly.  Avoid distractions at bedtime. Distractions include watching television or engaging in any intense or detailed activity like attempting to balance the household checkbook.  Develop a bedtime ritual. Keep a familiar routine of bathing, brushing your teeth, climbing into bed at the same time each night, listening to soothing music. Routines increase the success of falling to sleep faster.  Use relaxation techniques. This can be using breathing and muscle tension release routines. It can also include visualizing peaceful scenes. You can also help control troubling or intruding thoughts by keeping your mind occupied with boring or repetitive thoughts like the old concept of counting sheep. You can make it more creative like imagining planting one beautiful flower after another in your backyard garden.  During your day, work to eliminate stress. When this is not possible use some of the previous suggestions to help reduce the anxiety that accompanies stressful situations. MAKE SURE YOU:   Understand these instructions.  Will watch your condition.  Will get help right away if you are not doing well or get worse. Document Released: 06/05/2000 Document Revised: 08/31/2011 Document Reviewed: 07/06/2007 [facility] Patient Information 2014 [place].

## 2013-07-28 NOTE — Progress Notes (Signed)
Subjective:    Patient ID: [name] [name], male    DOB: [birth year], 38 y.o.   MRN: [id number]  HPI Comments: Patient is a 38 year old male who presents to the clinic with complaint of heel pain. Patient reports a few years ago he worked a job where he was on his feet all the time, started having heel pain then and was diagnosed with spur of right heel. States he left that job and stopped having pain, recently started new job where he is on his feet a lot and is now having the pain in the right heel return. Reports previously he had injections into the heel and is wondering if there is something else that can be done or does he need another injection. Patient also explains in a relationship with a partner who was tested positive for HIV. Patient has history of crohn's and recently saw his GI provider who tested him for HIV which was negative. Patient is asking for pre-exposure prophylaxis treatment. Denies other concerns at this time.    Review of Systems  Constitutional: Negative for fever and chills.  Eyes: Negative for pain and visual disturbance.  Respiratory: Negative for cough, chest tightness and shortness of breath.   Cardiovascular: Negative for chest pain, palpitations and leg swelling.  Gastrointestinal:       At his baseline based on crohn's condition  Genitourinary: Negative.   Musculoskeletal:       No foot pain currently, normally after on feet for extended period of time  Skin: Negative for rash.  Neurological: Negative for dizziness.  All other systems reviewed and are negative.      Past Medical History  Diagnosis Date  . Obstructive sleep apnea (adult) (pediatric)   . Anxiety and depression   . Essential hypertension, benign   . Obesity, unspecified   . Herpes zoster infection 07/2010, 12/2010    with post-herpetic neuralgia  . Vitamin D deficiency   . Crohn's disease   . Hyperlipidemia   . CMV (cytomegalovirus infection) 10/28/2010  . Nephrolithiasis   . Fistula,  perirectal     history of  . Sinus infection 08/05/11  . Gastritis   . Duodenitis   . Depression   . Anxiety      Objective:   Physical Exam  Vitals reviewed. Constitutional: He is oriented to person, place, and time. He appears well-developed and well-nourished.  HENT:  Head: Normocephalic and atraumatic.  Right Ear: External ear normal.  Left Ear: External ear normal.  Eyes: Conjunctivae are normal.  Neck: Normal range of motion.  Cardiovascular: Normal rate, regular rhythm, normal heart sounds and intact distal pulses.  Exam reveals no gallop and no friction rub.   No murmur heard. Pulmonary/Chest: Effort normal and breath sounds normal.  Abdominal: Soft.  Musculoskeletal: Normal range of motion.  Neurological: He is alert and oriented to person, place, and time.  Skin: Skin is warm and dry.  Psychiatric: He has a normal mood and affect.    Lab Results  Component Value Date   WBC 5.1 06/05/2013   HGB 13.2 06/05/2013   HCT 39.2 06/05/2013   PLT 194.0 06/05/2013   GLUCOSE 91 06/05/2013   ALT 13 06/05/2013   AST 17 06/05/2013   NA 138 06/05/2013   K 3.6 06/05/2013   CL 107 06/05/2013   CREATININE 0.8 06/05/2013   BUN 9 06/05/2013   CO2 24 06/05/2013   TSH 2.42 04/10/2011       Assessment &  Plan:   Heel pain: Not currently hurting Referral to Dr. Gardenia Phlegm, sports medicine for evaluation and possible orthotics.   Exposure to blood/body fluids Rx for Truvada Need to return to office within 3 months for labs  GAD/Stress/Insomnia Well controlled on current medications Refills for Xanax and Ambien Toxicology contract signed today  Tdap update today.

## 2013-07-28 NOTE — Telephone Encounter (Signed)
Patient sent clarification my chart message.  Letters sent at his request.

## 2013-08-03 ENCOUNTER — Other Ambulatory Visit: Payer: Self-pay | Admitting: Internal Medicine

## 2013-08-03 NOTE — Telephone Encounter (Signed)
Please forward to his PCP

## 2013-08-03 NOTE — Telephone Encounter (Signed)
Please advise regarding refills Sir?

## 2013-08-04 NOTE — Telephone Encounter (Signed)
Dr. Carlean Purl said to forward this to his PCP to be refilled, thank you for your help.

## 2013-08-09 ENCOUNTER — Encounter (HOSPITAL_COMMUNITY): Payer: BC Managed Care – PPO

## 2013-08-10 ENCOUNTER — Ambulatory Visit: Payer: BC Managed Care – PPO | Admitting: Family Medicine

## 2013-08-15 ENCOUNTER — Other Ambulatory Visit: Payer: Self-pay | Admitting: Internal Medicine

## 2013-08-15 ENCOUNTER — Ambulatory Visit: Payer: BC Managed Care – PPO | Admitting: Family Medicine

## 2013-08-15 NOTE — Telephone Encounter (Signed)
Ok to refill x 1 month Then thru pcp

## 2013-08-15 NOTE — Telephone Encounter (Signed)
Please advise regarding refills Sir, thank you.

## 2013-08-16 ENCOUNTER — Other Ambulatory Visit: Payer: Self-pay | Admitting: Internal Medicine

## 2013-08-16 NOTE — Telephone Encounter (Signed)
Ok to refill x 1  

## 2013-08-16 NOTE — Telephone Encounter (Signed)
Please advise Sir, thank you. 

## 2013-08-22 ENCOUNTER — Other Ambulatory Visit (INDEPENDENT_AMBULATORY_CARE_PROVIDER_SITE_OTHER): Payer: BC Managed Care – PPO

## 2013-08-22 ENCOUNTER — Encounter: Payer: Self-pay | Admitting: Family Medicine

## 2013-08-22 ENCOUNTER — Ambulatory Visit (INDEPENDENT_AMBULATORY_CARE_PROVIDER_SITE_OTHER): Payer: BC Managed Care – PPO | Admitting: Family Medicine

## 2013-08-22 VITALS — BP 120/70 | HR 87 | Temp 97.2°F | Resp 16 | Wt 217.0 lb

## 2013-08-22 DIAGNOSIS — M79609 Pain in unspecified limb: Secondary | ICD-10-CM

## 2013-08-22 DIAGNOSIS — M79671 Pain in right foot: Secondary | ICD-10-CM

## 2013-08-22 DIAGNOSIS — M722 Plantar fascial fibromatosis: Secondary | ICD-10-CM | POA: Insufficient documentation

## 2013-08-22 MED ORDER — MELOXICAM 15 MG PO TABS: 15.0000 mg | ORAL_TABLET | Freq: Every day | ORAL | Status: DC

## 2013-08-22 NOTE — Progress Notes (Signed)
Pre visit review using our clinic review tool, if applicable. No additional management support is needed unless otherwise documented below in the visit note. 

## 2013-08-22 NOTE — Assessment & Plan Note (Signed)
Discussed diagnosis, treatment options, and rehabilitation. Please see patient instructions. We'll do icing protocol, anti-inflammatories and warned that this could upset his stomach, we also over-the-counter orthotics.  Patient back in 3-4 weeks. If he continues to have trouble we'll try and nitroglycerin patch. If he continues that problem after that he may be a candidate for orthotics.

## 2013-08-22 NOTE — Patient Instructions (Addendum)
Very nice to meet you Ice bath 20 minutes 2 times a day Spenco orthotics at [employer] or online.  Meloxicam daily for 10 days then as need, if it hurts stomach email me and I will start the nitro patch.  Exercises most days of the week.   Heel lifts on stair 30 reps 1 set daily for first week then 2 sets daily then 3 sets daily thereafter Walk on toes across room 10x then on heels across room 10 x Shoes with rigid sole would be better, keen, merrell or dansko are the best.  NO BAREFOOT AT HOME Come again in 3-4 weeks.

## 2013-08-22 NOTE — Progress Notes (Signed)
  [name] [address], [place] [address] Phone: [phone] Subjective:    I'm seeing this patient by the request  of:  [name], [name]   CC: right heel pain.   [id number] [name] [name] is a 38 y.o. male coming in with complaint of right heel pain. Patient states he has had intermittent heel pain for quite some time. Patient states it seems to be worse in the morning and after sitting a long amount of time. Patient was the pain was on the medial aspect of the heel. Patient has had this exacerbation before and did see a foot centered 2 did do multiple injections which was helpful. Patient notices when he is on his feet more later that night he has more pain. Denies any swelling. Has tried changing shoes without any significant improvement. Patient has tried ibuprofen as well as Tylenol without any significant improvement. No icing. Patient rates the severity of 9/10.      Past medical history, social, surgical and family history all reviewed in electronic medical record.   Review of Systems: No headache, visual changes, nausea, vomiting, diarrhea, constipation, dizziness, abdominal pain, skin rash, fevers, chills, night sweats, weight loss, swollen lymph nodes, body aches, joint swelling, muscle aches, chest pain, shortness of breath, mood changes.   Objective Blood pressure 120/70, pulse 87, temperature 97.2 F (36.2 C), temperature source Oral, resp. rate 16, weight 217 lb (98.431 kg), SpO2 96.00%.  General: No apparent distress alert and oriented x3 mood and affect normal, dressed appropriately.  HEENT: Pupils equal, extraocular movements intact  Respiratory: Patient's speak in full sentences and does not appear short of breath  Cardiovascular: No lower extremity edema, non tender, no erythema  Skin: Warm dry intact with no signs of infection or rash on extremities or on axial skeleton.  Abdomen: Soft nontender  Neuro: Cranial nerves  II through XII are intact, neurovascularly intact in all extremities with 2+ DTRs and 2+ pulses.  Lymph: No lymphadenopathy of posterior or anterior cervical chain or axillae bilaterally.  Gait normal with good balance and coordination.  MSK:  Non tender with full range of motion and good stability and symmetric strength and tone of shoulders, elbows, wrist, hip, knee and ankles bilaterally.  Foot exam: Normal inspection with no visable or palpable fat pad atrophy and no visible swelling/erythema. Patient is tender at medial insertion of plantar fascia into calcaneus. Great toe motion: Mild limitus Arch shape: Over pronation of the hindfoot Other foot breakdown: Pes planus   MSK US performed of: Right foot This study was ordered, performed, and interpreted by Charlann Boxer D.O.  Foot/Ankle:   All structures visualized.   Talar dome unremarkable  Ankle mortise without effusion. Peroneus longus and brevis tendons unremarkable on long and transverse views without sheath effusions. Posterior tibialis, flexor hallucis longus, and flexor digitorum longus tendons unremarkable on long and transverse views without sheath effusions. Achilles tendon visualized along length of tendon and unremarkable on long and transverse views without sheath effusion. Anterior Talofibular Ligament and Calcaneofibular Ligaments unremarkable and intact. Deltoid Ligament unremarkable and intact. Plantar fascia intact a but significant hypoechoic changes. Patient's plantar fascia measures 1.39 cm. Questionable fibroma noted.    IMPRESSION:  Plantar fasciitis measured 1.3 cm     Impression and Recommendations:     This case required medical decision making of moderate complexity.

## 2013-08-23 ENCOUNTER — Encounter (HOSPITAL_COMMUNITY): Payer: Self-pay | Admitting: Emergency Medicine

## 2013-08-23 ENCOUNTER — Emergency Department (HOSPITAL_COMMUNITY)
Admission: EM | Admit: 2013-08-23 | Discharge: 2013-08-23 | Disposition: A | Discharge status: Home / Self Care | Payer: BC Managed Care – PPO | Attending: Emergency Medicine | Admitting: Emergency Medicine

## 2013-08-23 ENCOUNTER — Emergency Department (HOSPITAL_COMMUNITY): Payer: BC Managed Care – PPO

## 2013-08-23 DIAGNOSIS — Z8709 Personal history of other diseases of the respiratory system: Secondary | ICD-10-CM | POA: Insufficient documentation

## 2013-08-23 DIAGNOSIS — R197 Diarrhea, unspecified: Secondary | ICD-10-CM | POA: Insufficient documentation

## 2013-08-23 DIAGNOSIS — R1013 Epigastric pain: Secondary | ICD-10-CM | POA: Insufficient documentation

## 2013-08-23 DIAGNOSIS — Z8619 Personal history of other infectious and parasitic diseases: Secondary | ICD-10-CM | POA: Insufficient documentation

## 2013-08-23 DIAGNOSIS — F411 Generalized anxiety disorder: Secondary | ICD-10-CM | POA: Insufficient documentation

## 2013-08-23 DIAGNOSIS — Z79899 Other long term (current) drug therapy: Secondary | ICD-10-CM | POA: Insufficient documentation

## 2013-08-23 DIAGNOSIS — R109 Unspecified abdominal pain: Secondary | ICD-10-CM

## 2013-08-23 DIAGNOSIS — R630 Anorexia: Secondary | ICD-10-CM | POA: Insufficient documentation

## 2013-08-23 DIAGNOSIS — Z9889 Other specified postprocedural states: Secondary | ICD-10-CM | POA: Insufficient documentation

## 2013-08-23 DIAGNOSIS — F329 Major depressive disorder, single episode, unspecified: Secondary | ICD-10-CM | POA: Insufficient documentation

## 2013-08-23 DIAGNOSIS — I10 Essential (primary) hypertension: Secondary | ICD-10-CM | POA: Insufficient documentation

## 2013-08-23 DIAGNOSIS — Z8639 Personal history of other endocrine, nutritional and metabolic disease: Secondary | ICD-10-CM | POA: Insufficient documentation

## 2013-08-23 DIAGNOSIS — Z862 Personal history of diseases of the blood and blood-forming organs and certain disorders involving the immune mechanism: Secondary | ICD-10-CM | POA: Insufficient documentation

## 2013-08-23 DIAGNOSIS — E669 Obesity, unspecified: Secondary | ICD-10-CM | POA: Insufficient documentation

## 2013-08-23 DIAGNOSIS — R1012 Left upper quadrant pain: Secondary | ICD-10-CM | POA: Insufficient documentation

## 2013-08-23 DIAGNOSIS — F3289 Other specified depressive episodes: Secondary | ICD-10-CM | POA: Insufficient documentation

## 2013-08-23 DIAGNOSIS — Z87442 Personal history of urinary calculi: Secondary | ICD-10-CM | POA: Insufficient documentation

## 2013-08-23 DIAGNOSIS — R1110 Vomiting, unspecified: Secondary | ICD-10-CM

## 2013-08-23 DIAGNOSIS — K5900 Constipation, unspecified: Secondary | ICD-10-CM | POA: Insufficient documentation

## 2013-08-23 DIAGNOSIS — R1011 Right upper quadrant pain: Secondary | ICD-10-CM | POA: Insufficient documentation

## 2013-08-23 DIAGNOSIS — R112 Nausea with vomiting, unspecified: Secondary | ICD-10-CM | POA: Insufficient documentation

## 2013-08-23 LAB — COMPREHENSIVE METABOLIC PANEL
ALK PHOS: 76 U/L (ref 39–117)
ALT: 18 U/L (ref 0–53)
AST: 21 U/L (ref 0–37)
Albumin: 4.1 g/dL (ref 3.5–5.2)
BUN: 15 mg/dL (ref 6–23)
CO2: 27 mEq/L (ref 19–32)
Calcium: 10.1 mg/dL (ref 8.4–10.5)
Chloride: 96 mEq/L (ref 96–112)
Creatinine, Ser: 0.93 mg/dL (ref 0.50–1.35)
GLUCOSE: 95 mg/dL (ref 70–99)
POTASSIUM: 3.8 meq/L (ref 3.7–5.3)
Sodium: 138 mEq/L (ref 137–147)
TOTAL PROTEIN: 7.8 g/dL (ref 6.0–8.3)
Total Bilirubin: 0.3 mg/dL (ref 0.3–1.2)

## 2013-08-23 LAB — LIPASE, BLOOD: LIPASE: 32 U/L (ref 11–59)

## 2013-08-23 LAB — URINALYSIS, ROUTINE W REFLEX MICROSCOPIC
BILIRUBIN URINE: NEGATIVE
Glucose, UA: NEGATIVE mg/dL
Hgb urine dipstick: NEGATIVE
Ketones, ur: NEGATIVE mg/dL
Leukocytes, UA: NEGATIVE
NITRITE: NEGATIVE
PROTEIN: NEGATIVE mg/dL
SPECIFIC GRAVITY, URINE: 1.01 (ref 1.005–1.030)
UROBILINOGEN UA: 0.2 mg/dL (ref 0.0–1.0)
pH: 6 (ref 5.0–8.0)

## 2013-08-23 LAB — CBC WITH DIFFERENTIAL/PLATELET
Basophils Absolute: 0 10*3/uL (ref 0.0–0.1)
Basophils Relative: 0 % (ref 0–1)
EOS ABS: 0.1 10*3/uL (ref 0.0–0.7)
Eosinophils Relative: 2 % (ref 0–5)
HCT: 42.2 % (ref 39.0–52.0)
Hemoglobin: 14.4 g/dL (ref 13.0–17.0)
Lymphocytes Relative: 39 % (ref 12–46)
Lymphs Abs: 3.4 10*3/uL (ref 0.7–4.0)
MCH: 31.3 pg (ref 26.0–34.0)
MCHC: 34.1 g/dL (ref 30.0–36.0)
MCV: 91.7 fL (ref 78.0–100.0)
MONOS PCT: 11 % (ref 3–12)
Monocytes Absolute: 1 10*3/uL (ref 0.1–1.0)
Neutro Abs: 4.2 10*3/uL (ref 1.7–7.7)
Neutrophils Relative %: 48 % (ref 43–77)
Platelets: 258 10*3/uL (ref 150–400)
RBC: 4.6 MIL/uL (ref 4.22–5.81)
RDW: 15 % (ref 11.5–15.5)
WBC: 8.8 10*3/uL (ref 4.0–10.5)

## 2013-08-23 MED ORDER — HYDROMORPHONE HCL PF 1 MG/ML IJ SOLN
1.0000 mg | Freq: Once | INTRAMUSCULAR | Status: AC
Start: 2013-08-23 — End: 2013-08-23
  Administered 2013-08-23: 1 mg via INTRAVENOUS
  Filled 2013-08-23: qty 1

## 2013-08-23 MED ORDER — ONDANSETRON HCL 4 MG PO TABS: 4.0000 mg | ORAL_TABLET | Freq: Four times a day (QID) | ORAL | Status: DC

## 2013-08-23 MED ORDER — ONDANSETRON HCL 4 MG/2ML IJ SOLN
4.0000 mg | Freq: Once | INTRAMUSCULAR | Status: AC
  Administered 2013-08-23: 4 mg via INTRAVENOUS
  Filled 2013-08-23: qty 2

## 2013-08-23 MED ORDER — IOHEXOL 300 MG/ML  SOLN
50.0000 mL | Freq: Once | INTRAMUSCULAR | Status: AC | PRN
  Administered 2013-08-23: 50 mL via ORAL

## 2013-08-23 MED ORDER — IOHEXOL 300 MG/ML  SOLN
100.0000 mL | Freq: Once | INTRAMUSCULAR | Status: AC | PRN
  Administered 2013-08-23: 100 mL via INTRAVENOUS

## 2013-08-23 MED ORDER — SODIUM CHLORIDE 0.9 % IV BOLUS (SEPSIS)
1000.0000 mL | Freq: Once | INTRAVENOUS | Status: AC
  Administered 2013-08-23: 1000 mL via INTRAVENOUS

## 2013-08-23 MED ORDER — HYDROCODONE-ACETAMINOPHEN 5-325 MG PO TABS: 2.0000 | ORAL_TABLET | ORAL | Status: DC | PRN

## 2013-08-23 NOTE — ED Provider Notes (Signed)
CSN: [id number]     Arrival date & time 08/23/13  0033 History   First MD Initiated Contact with Patient 08/23/13 0214     Chief Complaint  Patient presents with  . Abdominal Pain     (Consider location/radiation/quality/duration/timing/severity/associated sxs/prior Treatment) Patient is a 38 y.o. male presenting with abdominal pain. The history is provided by the patient. No language interpreter was used.  Abdominal Pain Pain location:  LUQ, RUQ and epigastric Pain quality: sharp   Pain radiates to:  Does not radiate Pain severity:  Severe Onset quality:  Sudden Duration:  7 hours Timing:  Constant Progression:  Unchanged Chronicity:  Recurrent Context: previous surgery   Context: not sick contacts and not suspicious food intake   Relieved by:  Nothing Worsened by:  Nothing tried Ineffective treatments:  None tried Associated symptoms: anorexia, constipation (at baseline), diarrhea (at baseline), nausea and vomiting   Associated symptoms: no chest pain, no cough, no dysuria, no fatigue, no fever, no hematemesis, no melena and no shortness of breath   Vomiting:    Quality:  Stomach contents   Number of occurrences:  2   Severity:  Moderate   Duration:  4 hours   Timing:  Intermittent   Progression:  Unchanged Risk factors: obesity   Risk factors comment:  Hx of Crohn's disease   Past Medical History  Diagnosis Date  . Obstructive sleep apnea (adult) (pediatric)   . Anxiety and depression   . Essential hypertension, benign   . Obesity, unspecified   . Herpes zoster infection 07/2010, 12/2010    with post-herpetic neuralgia  . Vitamin D deficiency   . Crohn's disease   . Hyperlipidemia   . CMV (cytomegalovirus infection) 10/28/2010  . Nephrolithiasis   . Fistula, perirectal     history of  . Sinus infection 08/05/11  . Gastritis   . Duodenitis   . Depression   . Anxiety    Past Surgical History  Procedure Laterality Date  . Small intestine surgery  08/05/07    1  ft removed, ileo-cecectomy  . Hypospadias correction      Childhood   . Upper gastrointestinal endoscopy  10/15/2010    w/biopsy, mild gastritis and duodenitis  . Colonoscopy  10/14/2007    crohn's colitis, aphthous ulcers, mild anal stenosis  . Colon resection    . Tooth extraction     Family History  Problem Relation Age of Onset  . Heart disease Father     CAD/MI  . Hyperlipidemia Father   . Hypertension Father   . Gout Father   . Sleep apnea Father   . Colon cancer Neg Hx   . Diabetes Neg Hx   . COPD Neg Hx   . Obesity Mother   . Allergies Mother   . Cancer Mother   . Stroke Maternal Grandfather   . Allergies Sister   . Asthma Sister    History  Substance Use Topics  . Smoking status: Never Smoker   . Smokeless tobacco: Never Used  . Alcohol Use: No     Comment: rare    Review of Systems  Constitutional: Negative for fever, activity change, appetite change and fatigue.  HENT: Negative for congestion, facial swelling, rhinorrhea and trouble swallowing.   Eyes: Negative for photophobia and pain.  Respiratory: Negative for cough, chest tightness and shortness of breath.   Cardiovascular: Negative for chest pain and leg swelling.  Gastrointestinal: Positive for nausea, vomiting, abdominal pain, diarrhea (at baseline), constipation (at baseline)  and anorexia. Negative for melena and hematemesis.  Endocrine: Negative for polydipsia and polyuria.  Genitourinary: Negative for dysuria, urgency, decreased urine volume and difficulty urinating.  Musculoskeletal: Negative for back pain and gait problem.  Skin: Negative for color change, rash and wound.  Allergic/Immunologic: Negative for immunocompromised state.  Neurological: Negative for dizziness, facial asymmetry, speech difficulty, weakness, numbness and headaches.  Psychiatric/Behavioral: Negative for confusion, decreased concentration and agitation.      Allergies  Review of patient's allergies indicates no known  allergies.  Home Medications   Current Outpatient Rx  Name  Route  Sig  Dispense  Refill  . adalimumab (HUMIRA PEN) 40 MG/0.8ML injection   Subcutaneous   Inject 0.8 mLs (40 mg total) into the skin every 14 (fourteen) days. After starter kit complete   2 each   3   . ALPRAZolam (XANAX) 1 MG tablet   Oral   Take 1 mg by mouth 2 (two) times daily as needed for anxiety.         . ciclopirox (LOPROX) 0.77 % cream   Topical   Apply 1 application topically 2 (two) times daily as needed. Athletes feet         . emtricitabine-tenofovir (TRUVADA) 200-300 MG per tablet   Oral   Take 1 tablet by mouth daily.   90 tablet   3   . ibuprofen (ADVIL,MOTRIN) 200 MG tablet   Oral   Take 600 mg by mouth every 6 (six) hours as needed.         Marland Kitchen lisinopril-hydrochlorothiazide (PRINZIDE,ZESTORETIC) 20-25 MG per tablet   Oral   Take 1 tablet by mouth daily.         . mirtazapine (REMERON) 15 MG tablet   Oral   Take 15 mg by mouth at bedtime.         . ondansetron (ZOFRAN-ODT) 8 MG disintegrating tablet   Oral   Take 1 tablet (8 mg total) by mouth every 8 (eight) hours as needed for nausea or vomiting.   30 tablet   1   . PARoxetine (PAXIL) 20 MG tablet   Oral   Take 40 mg by mouth daily.         Marland Kitchen zolpidem (AMBIEN) 5 MG tablet   Oral   Take 1 tablet (5 mg total) by mouth at bedtime as needed for sleep.   30 tablet   1   . HYDROcodone-acetaminophen (NORCO) 5-325 MG per tablet   Oral   Take 2 tablets by mouth every 4 (four) hours as needed.   10 tablet   0   . meloxicam (MOBIC) 15 MG tablet   Oral   Take 1 tablet (15 mg total) by mouth daily.   30 tablet   0   . ondansetron (ZOFRAN) 4 MG tablet   Oral   Take 1 tablet (4 mg total) by mouth every 6 (six) hours.   12 tablet   0    BP 102/66  Pulse 65  Temp(Src) 97.5 F (36.4 C) (Oral)  Resp 18  Ht 5' 9"  (1.753 m)  Wt 217 lb (98.431 kg)  BMI 32.03 kg/m2  SpO2 95% Physical Exam  Constitutional: He is  oriented to person, place, and time. He appears well-developed and well-nourished. No distress.  HENT:  Head: Normocephalic and atraumatic.  Mouth/Throat: No oropharyngeal exudate.  Eyes: Pupils are equal, round, and reactive to light.  Neck: Normal range of motion. Neck supple.  Cardiovascular: Normal rate, regular rhythm and  normal heart sounds.  Exam reveals no gallop and no friction rub.   No murmur heard. Pulmonary/Chest: Effort normal and breath sounds normal. No respiratory distress. He has no wheezes. He has no rales.  Abdominal: Soft. Bowel sounds are normal. He exhibits no distension and no mass. There is tenderness in the right upper quadrant, epigastric area and left upper quadrant. There is no rigidity, no rebound and no guarding.  Musculoskeletal: Normal range of motion. He exhibits no edema and no tenderness.  Neurological: He is alert and oriented to person, place, and time.  Skin: Skin is warm and dry.  Psychiatric: He has a normal mood and affect.    ED Course  Procedures (including critical care time) Labs Review Labs Reviewed  CBC WITH DIFFERENTIAL  COMPREHENSIVE METABOLIC PANEL  LIPASE, BLOOD  URINALYSIS, ROUTINE W REFLEX MICROSCOPIC   Imaging Review Ct Abdomen Pelvis W Contrast  08/23/2013   CLINICAL DATA:  Abdominal pain. History of Crohn's, small bowel obstruction  EXAM: CT ABDOMEN AND PELVIS WITH CONTRAST  TECHNIQUE: Multidetector CT imaging of the abdomen and pelvis was performed using the standard protocol following bolus administration of intravenous contrast.  CONTRAST:  121m OMNIPAQUE IOHEXOL 300 MG/ML  SOLN  COMPARISON:  06/07/2013  FINDINGS: BODY WALL: 2 supraumbilical midline hernias, containing fat - similar to prior.  LOWER CHEST: Mild atelectasis at the bases.  ABDOMEN/PELVIS:  Liver: No focal abnormality.  Biliary: No evidence of biliary obstruction or stone.  Pancreas: Unremarkable.  Spleen: Unremarkable.  Adrenals: Unremarkable.  Kidneys and ureters:  Presumed cyst from the interpolar left kidney, 12 mm in diameter. The cyst is more collapsed on today's exam. No hydronephrosis or nephrolithiasis.  Bladder: Unremarkable.  Reproductive: Unremarkable.  Bowel: Postsurgical changes of terminal ileum and proximal colon resection. There is a entero colonic anastomosis at the hepatic flexure. No inflamed segments of small bowel or colon. No evidence of bowel obstruction, stricture, or fistula. No abscess.  Retroperitoneum: Unchanged size of prominent nodes in the remaining ileocolic chain.  Peritoneum: No free fluid or gas.  Vascular: No acute abnormality.  OSSEOUS: No acute abnormalities.  IMPRESSION: No evidence of bowel obstruction or active Crohn's enteritis.   Electronically Signed   By: JJorje GuildM.D.   On: 08/23/2013 04:58   UKoreaExtrem Low Right Ltd  08/22/2013   MSK UKoreaperformed of: Right foot  This study was ordered, performed, and interpreted by ZCharlann BoxerD.O.  Foot/Ankle:  All structures visualized.  Talar dome unremarkable  Ankle mortise without effusion.  Peroneus longus and brevis tendons unremarkable on long and transverse  views without sheath effusions.  Posterior tibialis, flexor hallucis longus, and flexor digitorum longus  tendons unremarkable on long and transverse views without sheath  effusions.  Achilles tendon visualized along length of tendon and unremarkable on long  and transverse views without sheath effusion.  Anterior Talofibular Ligament and Calcaneofibular Ligaments unremarkable  and intact.  Deltoid Ligament unremarkable and intact.  Plantar fascia intact a but significant hypoechoic changes. Patient's  plantar fascia measures 1.39 cm. Questionable fibroma noted.   08/22/2013   Plantar fasciitis measured 1.3 cm     EKG Interpretation None      MDM   Final diagnoses:  Abdominal pain  Vomiting    Pt is a 38y.o. male with Pmhx as above who presents with upper ab pain beginning at about 7:30PM tonight, with assoc n/v  and c/w prior episode of SBO.  Denies hematemesis, bloody stool. On PE, VSS,  pt in NAD. +upper abdominal ttp w/o rebound or guarding. Labs unremarkable.  CT ab pelvis ordered was also unremarkable.  Symptoms much improved with IVD, zofran, dilaudid. Pt able to tolerate PO.  Doubt acute surgical cause of pain such as SBO, cholecystitis, appendicitis. Also doubt acute Crohn's flare or pancreatitis. Pt may have early gastroenteritis. I feel he is safe to d/c home. Return precautions given for new or worsening symptoms including worsening or uncontrolled pain, fever, inability to tolerate PO.          Neta Ehlers, MD 08/23/13 [phone]

## 2013-08-23 NOTE — ED Notes (Signed)
Patient transported to CT 

## 2013-08-23 NOTE — ED Notes (Signed)
Pt given saltine crackers, [place] sandwich and coke.

## 2013-08-23 NOTE — ED Notes (Signed)
Pt reports severe abdominal pain since 1930, states pain is similar to previous obstruction, states he ate Cheerios and immediately vomited them back up. Pt reports taking Zofran that has helped his nausea since, but is still having abdominal pain. Pt a&o x4, ambulatory to triage.

## 2013-08-23 NOTE — Discharge Instructions (Signed)
Abdominal Pain, Adult °Many things can cause abdominal pain. Usually, abdominal pain is not caused by a disease and will improve without treatment. It can often be observed and treated at home. Your health care provider will do a physical exam and possibly order blood tests and X-rays to help determine the seriousness of your pain. However, in many cases, more time must pass before a clear cause of the pain can be found. Before that point, your health care provider may not know if you need more testing or further treatment. °HOME CARE INSTRUCTIONS  °Monitor your abdominal pain for any changes. The following actions may help to alleviate any discomfort you are experiencing: °· Only take over-the-counter or prescription medicines as directed by your health care provider. °· Do not take laxatives unless directed to do so by your health care provider. °· Try a clear liquid diet (broth, tea, or water) as directed by your health care provider. Slowly move to a bland diet as tolerated. °SEEK MEDICAL CARE IF: °· You have unexplained abdominal pain. °· You have abdominal pain associated with nausea or diarrhea. °· You have pain when you urinate or have a bowel movement. °· You experience abdominal pain that wakes you in the night. °· You have abdominal pain that is worsened or improved by eating food. °· You have abdominal pain that is worsened with eating fatty foods. °SEEK IMMEDIATE MEDICAL CARE IF:  °· Your pain does not go away within 2 hours. °· You have a fever. °· You keep throwing up (vomiting). °· Your pain is felt only in portions of the abdomen, such as the right side or the left lower portion of the abdomen. °· You pass bloody or black tarry stools. °MAKE SURE YOU: °· Understand these instructions.   °· Will watch your condition.   °· Will get help right away if you are not doing well or get worse.   °Document Released: 03/18/2005 Document Revised: 03/29/2013 Document Reviewed: 02/15/2013 °[employer]® Patient  Information ©2014 [employer]. ° °

## 2013-08-27 ENCOUNTER — Emergency Department (INDEPENDENT_AMBULATORY_CARE_PROVIDER_SITE_OTHER): Payer: BC Managed Care – PPO

## 2013-08-27 ENCOUNTER — Encounter (HOSPITAL_COMMUNITY): Payer: Self-pay | Admitting: Emergency Medicine

## 2013-08-27 ENCOUNTER — Emergency Department (INDEPENDENT_AMBULATORY_CARE_PROVIDER_SITE_OTHER)
Admission: EM | Admit: 2013-08-27 | Discharge: 2013-08-27 | Disposition: A | Discharge status: Home / Self Care | Payer: BC Managed Care – PPO | Source: Home / Self Care | Attending: Emergency Medicine | Admitting: Emergency Medicine

## 2013-08-27 DIAGNOSIS — R059 Cough, unspecified: Secondary | ICD-10-CM

## 2013-08-27 DIAGNOSIS — R05 Cough: Secondary | ICD-10-CM

## 2013-08-27 DIAGNOSIS — R112 Nausea with vomiting, unspecified: Secondary | ICD-10-CM

## 2013-08-27 DIAGNOSIS — J039 Acute tonsillitis, unspecified: Secondary | ICD-10-CM

## 2013-08-27 LAB — POCT RAPID STREP A: Streptococcus, Group A Screen (Direct): NEGATIVE

## 2013-08-27 LAB — POCT I-STAT, CHEM 8
BUN: 15 mg/dL (ref 6–23)
Calcium, Ion: 1.07 mmol/L — ABNORMAL LOW (ref 1.12–1.23)
Chloride: 97 mEq/L (ref 96–112)
Creatinine, Ser: 0.9 mg/dL (ref 0.50–1.35)
Glucose, Bld: 87 mg/dL (ref 70–99)
HEMATOCRIT: 43 % (ref 39.0–52.0)
Hemoglobin: 14.6 g/dL (ref 13.0–17.0)
Potassium: 3.8 mEq/L (ref 3.7–5.3)
Sodium: 137 mEq/L (ref 137–147)
TCO2: 28 mmol/L (ref 0–100)

## 2013-08-27 LAB — CBC WITH DIFFERENTIAL/PLATELET
BASOS ABS: 0 10*3/uL (ref 0.0–0.1)
BASOS PCT: 0 % (ref 0–1)
Eosinophils Absolute: 0.2 10*3/uL (ref 0.0–0.7)
Eosinophils Relative: 1 % (ref 0–5)
HCT: 40.1 % (ref 39.0–52.0)
Hemoglobin: 13.8 g/dL (ref 13.0–17.0)
Lymphocytes Relative: 26 % (ref 12–46)
Lymphs Abs: 2.9 10*3/uL (ref 0.7–4.0)
MCH: 32 pg (ref 26.0–34.0)
MCHC: 34.4 g/dL (ref 30.0–36.0)
MCV: 93 fL (ref 78.0–100.0)
MONO ABS: 1.4 10*3/uL — AB (ref 0.1–1.0)
Monocytes Relative: 13 % — ABNORMAL HIGH (ref 3–12)
Neutro Abs: 6.7 10*3/uL (ref 1.7–7.7)
Neutrophils Relative %: 60 % (ref 43–77)
PLATELETS: 210 10*3/uL (ref 150–400)
RBC: 4.31 MIL/uL (ref 4.22–5.81)
RDW: 15.2 % (ref 11.5–15.5)
WBC: 11.2 10*3/uL — AB (ref 4.0–10.5)

## 2013-08-27 LAB — POCT INFECTIOUS MONO SCREEN: MONO SCREEN: NEGATIVE

## 2013-08-27 MED ORDER — ONDANSETRON HCL 4 MG/2ML IJ SOLN
INTRAMUSCULAR | Status: AC
  Filled 2013-08-27: qty 2

## 2013-08-27 MED ORDER — PENICILLIN G BENZATHINE 1200000 UNIT/2ML IM SUSP
INTRAMUSCULAR | Status: AC
  Filled 2013-08-27: qty 2

## 2013-08-27 MED ORDER — ONDANSETRON HCL 4 MG/2ML IJ SOLN
4.0000 mg | Freq: Once | INTRAMUSCULAR | Status: AC
  Administered 2013-08-27: 4 mg via INTRAMUSCULAR

## 2013-08-27 MED ORDER — SODIUM CHLORIDE 0.9 % IV SOLN
INTRAVENOUS | Status: DC
  Administered 2013-08-27: 12:00:00 via INTRAVENOUS

## 2013-08-27 MED ORDER — HYDROMORPHONE HCL PF 1 MG/ML IJ SOLN
INTRAMUSCULAR | Status: AC
  Filled 2013-08-27: qty 1

## 2013-08-27 MED ORDER — PENICILLIN G BENZATHINE 1200000 UNIT/2ML IM SUSP
1.2000 10*6.[IU] | Freq: Once | INTRAMUSCULAR | Status: AC
  Administered 2013-08-27: 1.2 10*6.[IU] via INTRAMUSCULAR

## 2013-08-27 MED ORDER — PROMETHAZINE HCL 25 MG RE SUPP: 25.0000 mg | Freq: Four times a day (QID) | RECTAL | Status: DC | PRN

## 2013-08-27 MED ORDER — HYDROMORPHONE HCL PF 1 MG/ML IJ SOLN
1.0000 mg | Freq: Once | INTRAMUSCULAR | Status: AC
  Administered 2013-08-27: 1 mg via INTRAMUSCULAR

## 2013-08-27 MED ORDER — HYDROCOD POLST-CHLORPHEN POLST 10-8 MG/5ML PO LQCR: 5.0000 mL | Freq: Two times a day (BID) | ORAL | Status: DC | PRN

## 2013-08-27 MED ORDER — OXYCODONE-ACETAMINOPHEN 5-325 MG PO TABS: ORAL_TABLET | ORAL | Status: DC

## 2013-08-27 NOTE — Discharge Instructions (Signed)
Tonsillitis Tonsillitis is an infection of the throat that causes the tonsils to become red, tender, and swollen. Tonsils are collections of lymphoid tissue at the back of the throat. Each tonsil has crevices (crypts). Tonsils help fight nose and throat infections and keep infection from spreading to other parts of the body for the first 18 months of life.  CAUSES Sudden (acute) tonsillitis is usually caused by infection with streptococcal bacteria. Long-lasting (chronic) tonsillitis occurs when the crypts of the tonsils become filled with pieces of food and bacteria, which makes it easy for the tonsils to become repeatedly infected. SYMPTOMS  Symptoms of tonsillitis include:  A sore throat, with possible difficulty swallowing.  White patches on the tonsils.  Fever.  Tiredness.  New episodes of snoring during sleep, when you did not snore before.  Small, foul-smelling, yellowish-white pieces of material (tonsilloliths) that you occasionally cough up or spit out. The tonsilloliths can also cause you to have bad breath. DIAGNOSIS Tonsillitis can be diagnosed through a physical exam. Diagnosis can be confirmed with the results of lab tests, including a throat culture. TREATMENT  The goals of tonsillitis treatment include the reduction of the severity and duration of symptoms and prevention of associated conditions. Symptoms of tonsillitis can be improved with the use of steroids to reduce the swelling. Tonsillitis caused by bacteria can be treated with antibiotics. Usually, treatment with antibiotics is started before the cause of the tonsillitis is known. However, if it is determined that the cause is not bacterial, antibiotics will not treat the tonsillitis. If attacks of tonsillitis are severe and frequent, your caregiver may recommend surgery to remove the tonsils (tonsillectomy). HOME CARE INSTRUCTIONS   Rest as much as possible and get plenty of sleep.  Drink plenty of fluids. While the  throat is very sore, eat soft foods or liquids, such as sherbet, soups, or instant breakfast drinks.  Eat frozen ice pops.  Gargle with a warm or cold liquid to help soothe the throat. Mix 1/4 teaspoon of salt and 1/4 teaspoon of baking soda in in 8 oz of water. SEEK MEDICAL CARE IF:   Large, tender lumps develop in your neck.  A rash develops.  A green, yellow-brown, or bloody substance is coughed up.  You are unable to swallow liquids or food for 24 hours.  You notice that only one of the tonsils is swollen. SEEK IMMEDIATE MEDICAL CARE IF:   You develop any new symptoms such as vomiting, severe headache, stiff neck, chest pain, or trouble breathing or swallowing.  You have severe throat pain along with drooling or voice changes.  You have severe pain, unrelieved with recommended medications.  You are unable to fully open the mouth.  You develop redness, swelling, or severe pain anywhere in the neck.  You have a fever. MAKE SURE YOU:   Understand these instructions.  Will watch your condition.  Will get help right away if you are not doing well or get worse. Document Released: 03/18/2005 Document Revised: 02/08/2013 Document Reviewed: 11/25/2012 [facility] Patient Information 2014 [place], [place].

## 2013-08-27 NOTE — ED Notes (Signed)
IV start attempted x 2 by this writer w/o success

## 2013-08-27 NOTE — ED Notes (Signed)
NAD; MD eval only

## 2013-08-27 NOTE — ED Provider Notes (Signed)
Chief Complaint   Chief Complaint  Patient presents with  . Sore Throat    History of Present Illness   [name] [name] is a 38 year old male with Crohn's disease who has had a five-day history of fever, chills, myalgias, severe sore throat, and pain on swallowing. He has had multiple episodes of tonsillitis in the past. Sometimes he has strep and sometimes not. He's also had nasal congestion, rhinorrhea, headache, and a dry cough. He's had some wheezing, chest tightness, and aching in his ribs. He's felt nauseated and vomited. He's had some midabdominal pain and some loose stools. No blood in the vomitus or the stool. He went to the emergency room when this first began and had a CT of his abdomen. At that time GI symptoms with prominent thing. The CT was normal. He was given some Zofran. With this he states he's not able to keep down any medications, liquids, or solids.   Review of Systems   Other than as noted above, the patient denies any of the following symptoms. Systemic:  No fever, chills, sweats, myalgias, or headache. Eye:  No redness, pain or drainage. ENT:  No earache, nasal congestion, sneezing, rhinorrhea, sinus pressure, sinus pain, or post nasal drip. Lungs:  No cough, sputum production, wheezing, shortness of breath, or chest pain. GI:  No abdominal pain, nausea, vomiting, or diarrhea. Skin:  No rash.  [place]   Past medical history, family history, social history, meds, and allergies were reviewed. He has Crohn's disease and high blood pressure. He takes Humira, Xanax, and lisinopril/hydrochlorothiazide. He takes Truvada for HIV prevention. He also is on Mobic, Remeron, Paxil, and Ambien.  Physical Exam     Vital signs:  BP 119/73  Pulse 77  Temp(Src) 98.4 F (36.9 C) (Oral)  Resp 20  SpO2 97% Filed Vitals:   08/27/13 1054 08/27/13 1114 Supine  08/27/13 1115 Sitting  08/27/13 1116 Standing   BP: 129/79 123/75 137/87 119/73  Pulse: 83 65 80 77  Temp: 98.4 F  (36.9 C)     TempSrc: Oral     Resp: 20     SpO2: 97%      General:  Alert, in no distress. Phonation was normal, no drooling, and patient was able to handle secretions well.  Eye:  No conjunctival injection or drainage. Lids were normal. ENT:  TMs and canals were normal, without erythema or inflammation.  Nasal mucosa was clear and uncongested, without drainage.  Mucous membranes were moist.  Exam of pharynx tonsils were enlarged and red with spots of yellow exudate.  There were no oral ulcerations or lesions. There was no bulging of the tonsillar pillars, and the uvula was midline. Neck:  Supple, no adenopathy, tenderness or mass. Lungs:  No respiratory distress.  Lungs were clear to auscultation, without wheezes, rales or rhonchi.  Breath sounds were clear and equal bilaterally.  Heart:  Regular rhythm, without gallops, murmers or rubs. Skin:  Clear, warm, and dry, without rash or lesions.  Labs   Results for orders placed during the hospital encounter of 08/27/13  CBC WITH DIFFERENTIAL      Result Value Ref Range   WBC 11.2 (*) 4.0 - 10.5 K/uL   RBC 4.31  4.22 - 5.81 MIL/uL   Hemoglobin 13.8  13.0 - 17.0 g/dL   HCT 40.1  39.0 - 52.0 %   MCV 93.0  78.0 - 100.0 fL   MCH 32.0  26.0 - 34.0 pg   MCHC 34.4  30.0 -  36.0 g/dL   RDW 15.2  11.5 - 15.5 %   Platelets 210  150 - 400 K/uL   Neutrophils Relative % 60  43 - 77 %   Neutro Abs 6.7  1.7 - 7.7 K/uL   Lymphocytes Relative 26  12 - 46 %   Lymphs Abs 2.9  0.7 - 4.0 K/uL   Monocytes Relative 13 (*) 3 - 12 %   Monocytes Absolute 1.4 (*) 0.1 - 1.0 K/uL   Eosinophils Relative 1  0 - 5 %   Eosinophils Absolute 0.2  0.0 - 0.7 K/uL   Basophils Relative 0  0 - 1 %   Basophils Absolute 0.0  0.0 - 0.1 K/uL  POCT RAPID STREP A ([facility]RG CARE ONLY)      Result Value Ref Range   Streptococcus, Group A Screen (Direct) NEGATIVE  NEGATIVE  POCT INFECTIOUS MONO SCREEN      Result Value Ref Range   Mono Screen NEGATIVE  NEGATIVE  POCT I-STAT,  CHEM 8      Result Value Ref Range   Sodium 137  137 - 147 mEq/L   Potassium 3.8  3.7 - 5.3 mEq/L   Chloride 97  96 - 112 mEq/L   BUN 15  6 - 23 mg/dL   Creatinine, Ser 0.90  0.50 - 1.35 mg/dL   Glucose, Bld 87  70 - 99 mg/dL   Calcium, Ion 1.07 (*) 1.12 - 1.23 mmol/L   TCO2 28  0 - 100 mmol/L   Hemoglobin 14.6  13.0 - 17.0 g/dL   HCT 43.0  39.0 - 52.0 %   Radiology   Dg Chest 2 View  08/27/2013   CLINICAL DATA:  A 5 day history of cough  EXAM: CHEST  2 VIEW  COMPARISON:  CT ANGIO CHEST W/CM &/OR WO/CM dated 04/06/2013; DG CHEST 2 VIEW dated 04/06/2013  FINDINGS: The lungs are adequately inflated. There is no focal infiltrate. The cardiac silhouette is normal in size. The pulmonary vascularity is not engorged. The mediastinum is normal in width. There is no pleural effusion or pneumothorax. The trachea is midline. The observed portions of the bony thorax appear normal.  IMPRESSION: There is no evidence of active cardiopulmonary disease. One cannot exclude acute bronchitis in the appropriate clinical setting.   Electronically Signed   By: Berry Gallacher  [place]   On: 08/27/2013 11:17   I reviewed the images independently and personally and concur with the radiologist's findings.  Course in Ur[address]   He was given 1 L of normal saline intravenously and Zofran 4 mg IV. Thereafter he stated he felt a lot better.  Assessment   The primary encounter diagnosis was Tonsillitis. Diagnoses of Cough and Nausea and vomiting were also pertinent to this visit.  There is no evidence of a peritonsillar abscess.    Plan     1.  Meds:  The following meds were prescribed:   New Prescriptions   CHLORPHENIRAMINE-HYDROCODONE (TUSSIONEX) 10-8 MG/5ML LQCR    Take 5 mLs by mouth every 12 (twelve) hours as needed for cough.   OXYCODONE-ACETAMINOPHEN (PERCOCET) 5-325 MG PER TABLET    1 to 2 tablets every 6 hours as needed for pain.   PROMETHAZINE (PHENERGAN) 25 MG SUPPOSITORY    Place 1 suppository (25 mg  total) rectally every 6 (six) hours as needed for nausea or vomiting.    2.  Patient Education/Counseling:  The patient was given appropriate handouts, self care instructions, and instructed in  symptomatic relief, including hot saline gargles, throat lozenges, infectious precautions, and need to trade out toothbrush.    3.  Follow up:  The patient was told to follow up here if no better in 3 to 4 days, or sooner if becoming worse in any way, and given some red flag symptoms such as difficulty swallowing or breathing which would prompt immediate return.  Since he has had recurring episodes of tonsillitis, referred to Dr. Benjamine Mola for evaluation for tonsillectomy.     Harden Mo, MD 08/27/13 [phone]

## 2013-08-29 LAB — CULTURE, GROUP A STREP

## 2013-08-30 ENCOUNTER — Encounter: Payer: Self-pay | Admitting: Physician Assistant

## 2013-09-09 ENCOUNTER — Ambulatory Visit: Payer: Self-pay | Admitting: Family Medicine

## 2013-09-09 DIAGNOSIS — Z0289 Encounter for other administrative examinations: Secondary | ICD-10-CM

## 2013-09-11 ENCOUNTER — Other Ambulatory Visit: Payer: Self-pay | Admitting: Nurse Practitioner

## 2013-09-11 ENCOUNTER — Other Ambulatory Visit: Payer: Self-pay | Admitting: Internal Medicine

## 2013-09-11 NOTE — Telephone Encounter (Signed)
See if we cannot get these re-directed to PCP

## 2013-09-11 NOTE — Telephone Encounter (Signed)
Sending these request to [name], PCP to see if she can refill.

## 2013-09-11 NOTE — Telephone Encounter (Signed)
Please advise regarding refills Sir, thank you.

## 2013-09-13 ENCOUNTER — Telehealth: Payer: Self-pay | Admitting: *Deleted

## 2013-09-13 ENCOUNTER — Telehealth: Payer: Self-pay | Admitting: Internal Medicine

## 2013-09-13 ENCOUNTER — Other Ambulatory Visit: Payer: Self-pay | Admitting: *Deleted

## 2013-09-13 MED ORDER — ALPRAZOLAM 1 MG PO TABS: 1.0000 mg | ORAL_TABLET | Freq: Two times a day (BID) | ORAL | Status: DC | PRN

## 2013-09-13 MED ORDER — ADALIMUMAB 40 MG/0.8ML ~~LOC~~ KIT: 40.0000 mg | PACK | Freq: Once | SUBCUTANEOUS | Status: DC

## 2013-09-13 NOTE — Telephone Encounter (Signed)
I called and left a message for the patient advising I faxed a prescription to [facility] on [address] for the Xanax.  I told him to call me tomorrow am if that is the wrong pharmacy and I will be glad to fax it to the correct one.

## 2013-09-13 NOTE — Telephone Encounter (Signed)
Patient advised I have one pen.  He will come pick up tomorrow.  He is advised that he will need to go online and apply to [facility] and see if he qualifies for assistance.  [employer] has been in contact with him and they are helping him apply.    PJ- he will ask for you when he comes for pen tomorrow it is in the refrigerator.  I have logged it in [facility]

## 2013-09-18 ENCOUNTER — Inpatient Hospital Stay (HOSPITAL_COMMUNITY)
Admission: EM | Admit: 2013-09-18 | Discharge: 2013-09-21 | DRG: 689 | Disposition: A | Discharge status: Home / Self Care | Payer: BC Managed Care – PPO | Attending: Family Medicine | Admitting: Family Medicine

## 2013-09-18 ENCOUNTER — Encounter (HOSPITAL_COMMUNITY): Payer: Self-pay | Admitting: Emergency Medicine

## 2013-09-18 ENCOUNTER — Telehealth: Payer: Self-pay | Admitting: Internal Medicine

## 2013-09-18 ENCOUNTER — Emergency Department (HOSPITAL_COMMUNITY): Payer: BC Managed Care – PPO

## 2013-09-18 ENCOUNTER — Inpatient Hospital Stay (HOSPITAL_COMMUNITY): Payer: BC Managed Care – PPO

## 2013-09-18 DIAGNOSIS — Z825 Family history of asthma and other chronic lower respiratory diseases: Secondary | ICD-10-CM

## 2013-09-18 DIAGNOSIS — F418 Other specified anxiety disorders: Secondary | ICD-10-CM | POA: Diagnosis present

## 2013-09-18 DIAGNOSIS — N12 Tubulo-interstitial nephritis, not specified as acute or chronic: Secondary | ICD-10-CM | POA: Diagnosis present

## 2013-09-18 DIAGNOSIS — K2980 Duodenitis without bleeding: Secondary | ICD-10-CM | POA: Diagnosis present

## 2013-09-18 DIAGNOSIS — Z8249 Family history of ischemic heart disease and other diseases of the circulatory system: Secondary | ICD-10-CM

## 2013-09-18 DIAGNOSIS — Z79899 Other long term (current) drug therapy: Secondary | ICD-10-CM

## 2013-09-18 DIAGNOSIS — E559 Vitamin D deficiency, unspecified: Secondary | ICD-10-CM | POA: Diagnosis present

## 2013-09-18 DIAGNOSIS — F3289 Other specified depressive episodes: Secondary | ICD-10-CM

## 2013-09-18 DIAGNOSIS — G4700 Insomnia, unspecified: Secondary | ICD-10-CM | POA: Diagnosis present

## 2013-09-18 DIAGNOSIS — Z823 Family history of stroke: Secondary | ICD-10-CM

## 2013-09-18 DIAGNOSIS — M722 Plantar fascial fibromatosis: Secondary | ICD-10-CM

## 2013-09-18 DIAGNOSIS — N390 Urinary tract infection, site not specified: Principal | ICD-10-CM | POA: Diagnosis present

## 2013-09-18 DIAGNOSIS — R109 Unspecified abdominal pain: Secondary | ICD-10-CM | POA: Diagnosis present

## 2013-09-18 DIAGNOSIS — D899 Disorder involving the immune mechanism, unspecified: Secondary | ICD-10-CM

## 2013-09-18 DIAGNOSIS — F411 Generalized anxiety disorder: Secondary | ICD-10-CM

## 2013-09-18 DIAGNOSIS — E669 Obesity, unspecified: Secondary | ICD-10-CM

## 2013-09-18 DIAGNOSIS — E860 Dehydration: Secondary | ICD-10-CM | POA: Diagnosis present

## 2013-09-18 DIAGNOSIS — D72829 Elevated white blood cell count, unspecified: Secondary | ICD-10-CM | POA: Diagnosis present

## 2013-09-18 DIAGNOSIS — E66812 Obesity, class 2: Secondary | ICD-10-CM | POA: Diagnosis present

## 2013-09-18 DIAGNOSIS — A419 Sepsis, unspecified organism: Secondary | ICD-10-CM | POA: Diagnosis present

## 2013-09-18 DIAGNOSIS — K5090 Crohn's disease, unspecified, without complications: Secondary | ICD-10-CM

## 2013-09-18 DIAGNOSIS — I10 Essential (primary) hypertension: Secondary | ICD-10-CM | POA: Diagnosis present

## 2013-09-18 DIAGNOSIS — G4733 Obstructive sleep apnea (adult) (pediatric): Secondary | ICD-10-CM | POA: Diagnosis present

## 2013-09-18 DIAGNOSIS — R112 Nausea with vomiting, unspecified: Secondary | ICD-10-CM | POA: Diagnosis present

## 2013-09-18 DIAGNOSIS — Z8349 Family history of other endocrine, nutritional and metabolic diseases: Secondary | ICD-10-CM

## 2013-09-18 DIAGNOSIS — N419 Inflammatory disease of prostate, unspecified: Secondary | ICD-10-CM | POA: Diagnosis present

## 2013-09-18 DIAGNOSIS — E785 Hyperlipidemia, unspecified: Secondary | ICD-10-CM | POA: Diagnosis present

## 2013-09-18 DIAGNOSIS — D849 Immunodeficiency, unspecified: Secondary | ICD-10-CM | POA: Diagnosis present

## 2013-09-18 DIAGNOSIS — B353 Tinea pedis: Secondary | ICD-10-CM

## 2013-09-18 DIAGNOSIS — F329 Major depressive disorder, single episode, unspecified: Secondary | ICD-10-CM

## 2013-09-18 DIAGNOSIS — K5080 Crohn's disease of both small and large intestine without complications: Secondary | ICD-10-CM | POA: Diagnosis present

## 2013-09-18 LAB — URINALYSIS, ROUTINE W REFLEX MICROSCOPIC
GLUCOSE, UA: NEGATIVE mg/dL
Ketones, ur: NEGATIVE mg/dL
Nitrite: NEGATIVE
Protein, ur: 100 mg/dL — AB
SPECIFIC GRAVITY, URINE: 1.027 (ref 1.005–1.030)
UROBILINOGEN UA: 1 mg/dL (ref 0.0–1.0)
pH: 5.5 (ref 5.0–8.0)

## 2013-09-18 LAB — COMPREHENSIVE METABOLIC PANEL
ALT: 15 U/L (ref 0–53)
AST: 19 U/L (ref 0–37)
Albumin: 4 g/dL (ref 3.5–5.2)
Alkaline Phosphatase: 138 U/L — ABNORMAL HIGH (ref 39–117)
BUN: 16 mg/dL (ref 6–23)
CALCIUM: 9.9 mg/dL (ref 8.4–10.5)
CO2: 23 mEq/L (ref 19–32)
Chloride: 94 mEq/L — ABNORMAL LOW (ref 96–112)
Creatinine, Ser: 1.07 mg/dL (ref 0.50–1.35)
GFR calc non Af Amer: 86 mL/min — ABNORMAL LOW (ref >90)
GLUCOSE: 110 mg/dL — AB (ref 70–99)
Potassium: 3.8 mEq/L (ref 3.7–5.3)
Sodium: 135 mEq/L — ABNORMAL LOW (ref 137–147)
TOTAL PROTEIN: 9.1 g/dL — AB (ref 6.0–8.3)
Total Bilirubin: 0.9 mg/dL (ref 0.3–1.2)

## 2013-09-18 LAB — CBC WITH DIFFERENTIAL/PLATELET
BASOS PCT: 0 % (ref 0–1)
Basophils Absolute: 0 10*3/uL (ref 0.0–0.1)
Eosinophils Absolute: 0 10*3/uL (ref 0.0–0.7)
Eosinophils Relative: 0 % (ref 0–5)
HCT: 40.5 % (ref 39.0–52.0)
Hemoglobin: 14 g/dL (ref 13.0–17.0)
LYMPHS PCT: 18 % (ref 12–46)
Lymphs Abs: 3.2 10*3/uL (ref 0.7–4.0)
MCH: 31.8 pg (ref 26.0–34.0)
MCHC: 34.6 g/dL (ref 30.0–36.0)
MCV: 92 fL (ref 78.0–100.0)
Monocytes Absolute: 2 10*3/uL — ABNORMAL HIGH (ref 0.1–1.0)
Monocytes Relative: 11 % (ref 3–12)
NEUTROS ABS: 13 10*3/uL — AB (ref 1.7–7.7)
NEUTROS PCT: 71 % (ref 43–77)
Platelets: 243 10*3/uL (ref 150–400)
RBC: 4.4 MIL/uL (ref 4.22–5.81)
RDW: 15.5 % (ref 11.5–15.5)
WBC: 18.3 10*3/uL — AB (ref 4.0–10.5)

## 2013-09-18 LAB — URINE MICROSCOPIC-ADD ON

## 2013-09-18 LAB — LIPASE, BLOOD: Lipase: 19 U/L (ref 11–59)

## 2013-09-18 MED ORDER — METOCLOPRAMIDE HCL 5 MG/ML IJ SOLN
10.0000 mg | Freq: Once | INTRAMUSCULAR | Status: AC
  Administered 2013-09-18: 10 mg via INTRAVENOUS
  Filled 2013-09-18: qty 2

## 2013-09-18 MED ORDER — SODIUM CHLORIDE 0.9 % IV BOLUS (SEPSIS)
1000.0000 mL | Freq: Once | INTRAVENOUS | Status: AC
  Administered 2013-09-18: 1000 mL via INTRAVENOUS

## 2013-09-18 MED ORDER — HYDROMORPHONE HCL PF 1 MG/ML IJ SOLN
1.0000 mg | Freq: Once | INTRAMUSCULAR | Status: AC
  Administered 2013-09-18: 1 mg via INTRAVENOUS
  Filled 2013-09-18: qty 1

## 2013-09-18 MED ORDER — PROMETHAZINE HCL 25 MG/ML IJ SOLN
25.0000 mg | INTRAMUSCULAR | Status: AC
  Administered 2013-09-18: 25 mg via INTRAVENOUS
  Filled 2013-09-18: qty 1

## 2013-09-18 MED ORDER — IOHEXOL 300 MG/ML  SOLN
50.0000 mL | Freq: Once | INTRAMUSCULAR | Status: AC | PRN
  Administered 2013-09-18: 50 mL via ORAL

## 2013-09-18 MED ORDER — DEXTROSE 5 % IV SOLN
1.0000 g | Freq: Once | INTRAVENOUS | Status: AC
  Administered 2013-09-18: 1 g via INTRAVENOUS
  Filled 2013-09-18: qty 10

## 2013-09-18 MED ORDER — IOHEXOL 300 MG/ML  SOLN
100.0000 mL | Freq: Once | INTRAMUSCULAR | Status: AC | PRN
  Administered 2013-09-18: 100 mL via INTRAVENOUS

## 2013-09-18 NOTE — H&P (Signed)
[facility] History and Physical  [name] [name]:[id number] DOB: 1975-07-10 DOA: 09/18/2013  Referring physician: Dr. Alvino Chapel PCP: [name], [name] /gastroenterology: Dr. Carlean Purl  Chief Complaint: abdominal pain/ nausea and vomiting  HPI: [name] [name] is a 38 y.o. male  With history of Crohn's disease diagnosed at age 44, anxiety and depression, hypertension, duodenitis who presents to the ED with a three-day history of nausea vomiting and right-sided abdominal pain and inability to keep anything down. Patient endorses a fever to a temp of 102 some associated chills and some generalized weakness. Patient denies any chest pain, no shortness of breath, no cough, no hematemesis, no hematochezia, no melena. Patient denies any constipation, no diarrhea. Patient stated that has had one bowel movement in 3 days and usually has several bowel movements per day secondary to his Crohn's. Patient denies any mucus in the stool. Patient stated that he called his PCP and was told to come to the hospital. Patient was seen in the emergency room comprehensive metabolic profile done on a sodium of 135 alk phosphatase of 138 otherwise was within normal limits. CBC done had a white count of 18.3 otherwise was within normal limits. Urinalysis which was done was turbid with moderate leukocytes too numerous to count WBCs and many bacteria. Acute abdominal series was done was negative. Abdominal ultrasound was unremarkable. Patient was given a dose of IV Rocephin. We were called to admit the patient for further evaluation and management.   Review of Systems:  As per history of present illness otherwise negative. Constitutional:  No weight loss, night sweats, Fevers, chills, fatigue.  HEENT:  No headaches, Difficulty swallowing,Tooth/dental problems,Sore throat,  No sneezing, itching, ear ache, nasal congestion, post nasal drip,  Cardio-vascular:  No chest pain, Orthopnea, PND, swelling in lower  extremities, anasarca, dizziness, palpitations  GI:  No heartburn, indigestion, abdominal pain, nausea, vomiting, diarrhea, change in bowel habits, loss of appetite  Resp:  No shortness of breath with exertion or at rest. No excess mucus, no productive cough, No non-productive cough, No coughing up of blood.No change in color of mucus.No wheezing.No chest wall deformity  Skin:  no rash or lesions.  GU:  no dysuria, change in color of urine, no urgency or frequency. No flank pain.  Musculoskeletal:  No joint pain or swelling. No decreased range of motion. No back pain.  Psych:  No change in mood or affect. No depression or anxiety. No memory loss.   Past Medical History  Diagnosis Date  . Obstructive sleep apnea (adult) (pediatric)   . Anxiety and depression   . Essential hypertension, benign   . Obesity, unspecified   . Herpes zoster infection 07/2010, 12/2010    with post-herpetic neuralgia  . Vitamin D deficiency   . Crohn's disease   . Hyperlipidemia   . CMV (cytomegalovirus infection) 10/28/2010  . Nephrolithiasis   . Fistula, perirectal     history of  . Sinus infection 08/05/11  . Gastritis   . Duodenitis   . Depression   . Anxiety    Past Surgical History  Procedure Laterality Date  . Small intestine surgery  08/05/07    1 ft removed, ileo-cecectomy  . Hypospadias correction      Childhood   . Upper gastrointestinal endoscopy  10/15/2010    w/biopsy, mild gastritis and duodenitis  . Colonoscopy  10/14/2007    crohn's colitis, aphthous ulcers, mild anal stenosis  . Colon resection    . Tooth extraction  Social History:  reports that he has never smoked. He has never used smokeless tobacco. He reports that he does not drink alcohol or use illicit drugs.  No Known Allergies  Family History  Problem Relation Age of Onset  . Heart disease Father     CAD/MI  . Hyperlipidemia Father   . Hypertension Father   . Gout Father   . Sleep apnea Father   . Colon cancer  Neg Hx   . Diabetes Neg Hx   . COPD Neg Hx   . Obesity Mother   . Allergies Mother   . Cancer Mother   . Stroke Maternal Grandfather   . Allergies Sister   . Asthma Sister      Prior to Admission medications   Medication Sig Start Date End Date Taking? Authorizing Provider  adalimumab (HUMIRA PEN) 40 MG/0.8ML injection Inject 0.8 mLs (40 mg total) into the skin every 14 (fourteen) days. After starter kit complete 06/30/13  Yes Gatha Mayer, MD  ALPRAZolam Duanne Moron) 1 MG tablet Take 1 tablet (1 mg total) by mouth 2 (two) times daily as needed for anxiety. 09/13/13  Yes Willia Craze, NP  emtricitabine-tenofovir (TRUVADA) 200-300 MG per tablet Take 1 tablet by mouth daily. 07/28/13  Yes [name], [name]  ibuprofen (ADVIL,MOTRIN) 200 MG tablet Take 600 mg by mouth every 6 (six) hours as needed.   Yes Historical Provider, MD  lisinopril-hydrochlorothiazide (PRINZIDE,ZESTORETIC) 20-25 MG per tablet Take 1 tablet by mouth daily.   Yes Historical Provider, MD  mirtazapine (REMERON) 15 MG tablet Take 15 mg by mouth at bedtime.   Yes Historical Provider, MD  ondansetron (ZOFRAN) 4 MG tablet Take 1 tablet (4 mg total) by mouth every 6 (six) hours. 08/23/13  Yes Neta Ehlers, MD  PARoxetine (PAXIL) 20 MG tablet Take 40 mg by mouth daily.   Yes Historical Provider, MD   Physical Exam: Filed Vitals:   09/18/13 2052  BP: 111/62  Pulse: 88  Temp: 98.8 F (37.1 C)  Resp: 18    BP 111/62  Pulse 88  Temp(Src) 98.8 F (37.1 C) (Oral)  Resp 18  SpO2 97%  General:  Appears calm and comfortable. In no acute cardiopulmonary distress.  Eyes: PERRLA, EOMI, normal lids, irises & conjunctiva ENT: grossly normal hearing, lips & tongue. Dry mucous membranes.  Neck: no LAD, masses or thyromegaly Cardiovascular: RRR, no m/r/g. No LE edema. Respiratory: CTA bilaterally, no w/r/r. Normal respiratory effort. Abdomen: soft, nondistended, right upper quadrant and right mid abdominal region tender to  palpation. No rebound. No guarding. No CVA tenderness. Skin: no rash or induration seen on limited exam Musculoskeletal: grossly normal tone BUE/BLE Psychiatric: grossly normal mood and affect, speech fluent and appropriate Neurologic:  Alert in order x3. Cranial nerves II through XII are grossly intact. No focal deficits.          Labs on Admission:  Basic Metabolic Panel:  Recent Labs Lab 09/18/13 1857  NA 135*  K 3.8  CL 94*  CO2 23  GLUCOSE 110*  BUN 16  CREATININE 1.07  CALCIUM 9.9   Liver Function Tests:  Recent Labs Lab 09/18/13 1857  AST 19  ALT 15  ALKPHOS 138*  BILITOT 0.9  PROT 9.1*  ALBUMIN 4.0    Recent Labs Lab 09/18/13 1857  LIPASE 19   No results found for this basename: AMMONIA,  in the last 168 hours CBC:  Recent Labs Lab 09/18/13 1857  WBC 18.3*  NEUTROABS 13.0*  HGB 14.0  HCT 40.5  MCV 92.0  PLT 243   Cardiac Enzymes: No results found for this basename: CKTOTAL, CKMB, CKMBINDEX, TROPONINI,  in the last 168 hours  BNP (last 3 results) No results found for this basename: PROBNP,  in the last 8760 hours CBG: No results found for this basename: GLUCAP,  in the last 168 hours  Radiological Exams on Admission: US Abdomen Complete  09/18/2013   CLINICAL DATA:  Abdominal pain.  EXAM: ULTRASOUND ABDOMEN COMPLETE  COMPARISON:  10/16/2010  FINDINGS: Gallbladder:  No gallstones or wall thickening visualized. No sonographic Murphy sign noted.  Common bile duct:  Diameter: 5 mm.  Where visualized, no filling defect.  Liver:  No focal lesion identified. Within normal limits in parenchymal echogenicity.  IVC:  No abnormality visualized.  Pancreas:  Not visualize due to shadowing bowel gas.  Spleen:  Size and appearance within normal limits.  Right Kidney:  Length: 13 cm. Echogenicity within normal limits. No mass or hydronephrosis visualized.  Left Kidney:  Length: 13 cm. Echogenicity within normal limits. No mass or hydronephrosis visualized. Known  1 cm cyst in the interpolar region not visualized.  Abdominal aorta:  No aneurysm visualized.  Other findings:  None.  IMPRESSION: Negative abdominal ultrasound.   Electronically Signed   By: Jorje Guild M.D.   On: 09/18/2013 20:56   Dg Abd Acute W/chest  09/18/2013   CLINICAL DATA:  Abdominal pain  EXAM: ACUTE ABDOMEN SERIES (ABDOMEN 2 VIEW & CHEST 1 VIEW)  COMPARISON:  Prior chest x-ray 08/27/2013; prior CT abdomen/ pelvis 08/23/2013  FINDINGS: Stable cardiac and mediastinal contours. Mild diffuse interstitial prominence is similar compared to prior and therefore likely chronic. No focal airspace consolidation, pleural effusion or pneumothorax.  Nonspecific, nonobstructive bowel gas pattern. No dilatation, evidence of free air or significant air-fluid levels. Gas is noted throughout numerous loops of nondilated small bowel in the mid abdomen. Chain surgical sutures are present in the right hemi abdomen consistent with known prior right hemicolectomy and ileocolonic anastomosis. No organomegaly or abnormal calcification. No acute osseous abnormality.  IMPRESSION: Negative abdominal radiographs.  No acute cardiopulmonary disease.   Electronically Signed   By: Jacqulynn Cadet M.D.   On: 09/18/2013 19:15    EKG: Not done  Assessment/Plan Principal Problem:   Abdominal pain, acute Active Problems:   Obesity, unspecified   ANXIETY   DEPRESSION   OBSTRUCTIVE SLEEP APNEA   Essential hypertension, benign   CROHN'S DISEASE, LARGE AND SMALL INTESTINES   Immunosuppression   Nausea with vomiting   UTI (lower urinary tract infection)   Leukocytosis   Abdominal pain   #1 acute abdominal pain Patient with right-sided abdominal pain in the right upper quadrant and right mid abdominal region with associated nausea and vomiting. Questionable etiology. Patient does have a history of Crohn's disease is on immunosuppressive medications noted to have a leukocytosis and fever with some chills. Concern for  possible Crohn's flare. Urinalysis done was consistent with a UTI. Will check blood cultures x2. Check a CT of the abdomen and pelvis. Placed empirically on IV Rocephin, IV fluids, anti-medics, supportive care. If CT is abnormal and worrisome for Crohn's flare will need a GI consultation.  #2 dehydration IV fluids.  #3 nausea and vomiting Questionable etiology. Abdominal x-rays were negative for any acute abnormalities. Abdominal ultrasound was also negative. May be secondary to UTI versus problem #1. Place on IV fluids, antibiotics, CT of the abdomen and pelvis is pending. IV Rocephin empirically for  urinary tract infection.  #4 urinary tract infection Urine cultures are pending. Place on IV Rocephin.  #5 leukocytosis Questionable etiology. May be secondary to UTI. CT of the abdomen and pelvis is pending. Chest x-ray was negative. Will check blood cultures x2. Placed empirically on IV Rocephin. Follow.  #6 Crohn's disease CT of the abdomen and pelvis is pending for further evaluation of problem #1. Will hold patient's immunosuppressive medications for now while treating UTI. Follow. If CT of the abdomen and pelvis is abnormal hours 04 Crohn's flare may need IV steroids and a GI consultation. Follow.  #7 hypertension Hold blood pressure medications for now.  #8 prophylaxis PPI for GI prophylaxis. Lovenox for DVT prophylaxis.  Code Status: Full Family Communication: Updated patient at bedside. No family present. Disposition Plan: Admit to MedSurg  Time spent: 52 minutes  [facility] MD [facility] Pager [phone]

## 2013-09-18 NOTE — ED Notes (Signed)
Pt transported to CT then will transport Floor. Floor notified of delay.

## 2013-09-18 NOTE — Telephone Encounter (Signed)
Patient reports that he has had vomiting since last Friday. Feels either "hot or cold, almost like I have the flu"  Not able to drink or eat anything without vomiting he feels weak.  He is not having any other crohn's symptoms.  No diarrhea.  He is advised he should go to ER or Urgent Care for evaluation, no openings on Urgent schedule to be seen today.  He will call back with an update once he is seen in Er or Urgent care.

## 2013-09-18 NOTE — ED Notes (Signed)
Per pt, states has'nt been able to keep food down since Friday-has Chron's disease-was told to by GI to come to ED to be evaluated

## 2013-09-18 NOTE — ED Provider Notes (Signed)
CSN: [id number]     Arrival date & time 09/18/13  1657 History   First MD Initiated Contact with Patient 09/18/13 1734     Chief Complaint  Patient presents with  . Emesis     (Consider location/radiation/quality/duration/timing/severity/associated sxs/prior Treatment) Patient is a 38 y.o. male presenting with vomiting.  Emesis  38 yo male with hx of chron's disease dx 14 years prior, presents with N/V and abdominal pain since Friday night. Paitnet denies any alcohol use or sick contacts. Patinet Admits to RUQ pain that is sharp and crampy in nature that is intermittent and rated at 8/10. Patient states never had this before. States this is not like his typical Chron's flare. Admits to 6 episodes of N/V per day since Friday. States he ate some hashbrown casserole over the weekend and vomited it back up. Has not eaten much since, just drinking sodas. Patient taken zofran with little relief. Denies CP, SOB, or diarrhea. No bowel movement since Friday. Patient normally 7 BMs per day.  Admits to Fever/chills, Brownish yellow urine. PMH significant for Bowel obstruction 5 yrs ago that he received surgery for.  Patient currently on Humira, switched from remicade a couple months ago. Last colonoscopy was in feb 2014, without noted acute abnormalities. Patient follows with Dr. Carlean Purl. Patient admits to hx of chronic N/V Past Medical History  Diagnosis Date  . Obstructive sleep apnea (adult) (pediatric)   . Anxiety and depression   . Essential hypertension, benign   . Obesity, unspecified   . Herpes zoster infection 07/2010, 12/2010    with post-herpetic neuralgia  . Vitamin D deficiency   . Crohn's disease   . Hyperlipidemia   . CMV (cytomegalovirus infection) 10/28/2010  . Nephrolithiasis   . Fistula, perirectal     history of  . Sinus infection 08/05/11  . Gastritis   . Duodenitis   . Depression   . Anxiety    Past Surgical History  Procedure Laterality Date  . Small intestine surgery   08/05/07    1 ft removed, ileo-cecectomy  . Hypospadias correction      Childhood   . Upper gastrointestinal endoscopy  10/15/2010    w/biopsy, mild gastritis and duodenitis  . Colonoscopy  10/14/2007    crohn's colitis, aphthous ulcers, mild anal stenosis  . Colon resection    . Tooth extraction     Family History  Problem Relation Age of Onset  . Heart disease Father     CAD/MI  . Hyperlipidemia Father   . Hypertension Father   . Gout Father   . Sleep apnea Father   . Colon cancer Neg Hx   . Diabetes Neg Hx   . COPD Neg Hx   . Obesity Mother   . Allergies Mother   . Cancer Mother   . Stroke Maternal Grandfather   . Allergies Sister   . Asthma Sister    History  Substance Use Topics  . Smoking status: Never Smoker   . Smokeless tobacco: Never Used  . Alcohol Use: 1.8 oz/week    3 Shots of liquor per week     Comment: 3/week    Review of Systems  Gastrointestinal: Positive for vomiting.  All other systems reviewed and are negative.      Allergies  Review of patient's allergies indicates no known allergies.  Home Medications   No current outpatient prescriptions on file. BP 114/63  Pulse 72  Temp(Src) 97.7 F (36.5 C) (Oral)  Resp 16  Ht 5'  9" (1.753 m)  Wt 209 lb 11.2 oz (95.119 kg)  BMI 30.95 kg/m2  SpO2 99% Physical Exam  Nursing note and vitals reviewed. Constitutional: He is oriented to person, place, and time. He appears well-developed and well-nourished. No distress.  HENT:  Head: Normocephalic and atraumatic.  Eyes: Conjunctivae are normal. No scleral icterus.  Neck: No JVD present. No tracheal deviation present.  Cardiovascular: Regular rhythm.  Tachycardia present.  Exam reveals no gallop and no friction rub.   No murmur heard. Pulmonary/Chest: Effort normal and breath sounds normal. No respiratory distress. He has no wheezes. He has no rhonchi. He has no rales.  Abdominal: Soft. He exhibits no distension. Bowel sounds are increased. There  is no hepatosplenomegaly. There is tenderness in the right upper quadrant and right lower quadrant. There is no rigidity, no rebound, no guarding, no CVA tenderness, no tenderness at McBurney's point and negative Murphy's sign.  Musculoskeletal: He exhibits no edema.  Neurological: He is alert and oriented to person, place, and time.  Skin: Skin is warm and dry. He is not diaphoretic.  Psychiatric: He has a normal mood and affect. His behavior is normal.    ED Course  Procedures (including critical care time) Labs Review Labs Reviewed  COMPREHENSIVE METABOLIC PANEL - Abnormal; Notable for the following:    Sodium 135 (*)    Chloride 94 (*)    Glucose, Bld 110 (*)    Total Protein 9.1 (*)    Alkaline Phosphatase 138 (*)    GFR calc non Af Amer 86 (*)    All other components within normal limits  CBC WITH DIFFERENTIAL - Abnormal; Notable for the following:    WBC 18.3 (*)    Neutro Abs 13.0 (*)    Monocytes Absolute 2.0 (*)    All other components within normal limits  URINALYSIS, ROUTINE W REFLEX MICROSCOPIC - Abnormal; Notable for the following:    Color, Urine AMBER (*)    APPearance TURBID (*)    Hgb urine dipstick TRACE (*)    Bilirubin Urine SMALL (*)    Protein, ur 100 (*)    Leukocytes, UA MODERATE (*)    All other components within normal limits  URINE MICROSCOPIC-ADD ON - Abnormal; Notable for the following:    Bacteria, UA MANY (*)    All other components within normal limits  COMPREHENSIVE METABOLIC PANEL - Abnormal; Notable for the following:    Sodium 133 (*)    Potassium 3.2 (*)    Glucose, Bld 127 (*)    Albumin 3.3 (*)    All other components within normal limits  CBC WITH DIFFERENTIAL - Abnormal; Notable for the following:    WBC 14.1 (*)    RBC 3.86 (*)    Hemoglobin 12.3 (*)    HCT 35.8 (*)    RDW 15.8 (*)    Neutro Abs 8.6 (*)    Monocytes Relative 14 (*)    Monocytes Absolute 2.0 (*)    All other components within normal limits  GLUCOSE,  CAPILLARY - Abnormal; Notable for the following:    Glucose-Capillary 123 (*)    All other components within normal limits  URINE CULTURE  CULTURE, BLOOD (ROUTINE X 2)  CULTURE, BLOOD (ROUTINE X 2)  LIPASE, BLOOD  CREATININE, SERUM  MAGNESIUM   Imaging Review US Abdomen Complete  09/18/2013   CLINICAL DATA:  Abdominal pain.  EXAM: ULTRASOUND ABDOMEN COMPLETE  COMPARISON:  10/16/2010  FINDINGS: Gallbladder:  No gallstones or wall thickening  visualized. No sonographic Murphy sign noted.  Common bile duct:  Diameter: 5 mm.  Where visualized, no filling defect.  Liver:  No focal lesion identified. Within normal limits in parenchymal echogenicity.  IVC:  No abnormality visualized.  Pancreas:  Not visualize due to shadowing bowel gas.  Spleen:  Size and appearance within normal limits.  Right Kidney:  Length: 13 cm. Echogenicity within normal limits. No mass or hydronephrosis visualized.  Left Kidney:  Length: 13 cm. Echogenicity within normal limits. No mass or hydronephrosis visualized. Known 1 cm cyst in the interpolar region not visualized.  Abdominal aorta:  No aneurysm visualized.  Other findings:  None.  IMPRESSION: Negative abdominal ultrasound.   Electronically Signed   By: Jorje Guild M.D.   On: 09/18/2013 20:56   Ct Abdomen Pelvis W Contrast  09/19/2013   CLINICAL DATA:  38 year old male with a history of Crohn's disease. Lower abdominal pain and diarrhea.  EXAM: CT ABDOMEN AND PELVIS WITH CONTRAST  TECHNIQUE: Multidetector CT imaging of the abdomen and pelvis was performed using the standard protocol following bolus administration of intravenous contrast.  CONTRAST:  85m OMNIPAQUE IOHEXOL 300 MG/ML SOLN, 1063mOMNIPAQUE IOHEXOL 300 MG/ML SOLN  COMPARISON:  Recent prior CT abdomen/ pelvis 08/23/2013. Ultrasound performed earlier today.  FINDINGS: Lower Chest: The lung bases are clear. Visualized cardiac structures are within normal limits for size. No pericardial effusion. Unremarkable  visualized distal thoracic esophagus.  Abdomen: Unremarkable CT appearance of the stomach, duodenum, spleen, adrenal glands and pancreas. Normal hepatic contour and morphology. No discrete hepatic lesion. Gallbladder is unremarkable. No intra or extrahepatic biliary ductal dilatation. Unremarkable appearance of the bilateral kidneys. No focal solid lesion, hydronephrosis or nephrolithiasis. Stable 1 cm renal cyst exophytic from the interpolar left kidney.  Surgical changes of prior right hemicolectomy with widely patent ileocolonic anastomosis. No evidence of bowel obstruction or focal bowel wall thickening. Contrast material passes through the small bowel and into the colon. No free fluid or suspicious adenopathy.  Pelvis: Unremarkable bladder, prostate gland and seminal vesicles. Compared to 08/23/2013, interval development of bilateral iliac chain adenopathy. On series 3, index nodes include 1 cm right common iliac (image 47) 11 mm right external iliac (image 57) and a 9 mm left external iliac (image 63). There is subtle haziness about the newly enlarged lymph nodes suggesting active adenitis. Nonspecific coarse central calcifications in the prostate gland. The seminal vesicles appear enlarged, hyperemic and demonstrates subtle surrounding interstitial stranding. The prostate gland also appears slightly hazy about the margins.  Bones/Soft Tissues: No acute fracture or aggressive appearing lytic or blastic osseous lesion. Multiple small midline ventral abdominal hernias containing omental fat.  Vascular: No significant atherosclerotic vascular disease, aneurysmal dilatation or acute abnormality.  IMPRESSION: 1. Compared to 08/23/2013 there has been interval development of bilateral iliac chain pelvic adenopathy with a subtle haziness around the newly enlarged lymph nodes suggesting acute/active adenitis. This distribution suggests a genitourinary source. The prostate gland and seminal vesicles also appear more  prominent and ill-defined in previously seen with a suggestion of subtle surrounding interstitial inflammation. Query acute prostatitis? There is no significant adenopathy in the mesentery or upper retroperitoneum to suggest a gastrointestinal source. 2. Stable appearance of a multifocal small omental fat containing ventral abdominal hernias. 3. Surgical changes of prior partial right colectomy with a widely patent ileocolonic anastomosis. No evidence of bowel obstruction or active Crohn's disease.   Electronically Signed   By: HeJacqulynn Cadet.D.   On: 09/19/2013 00:36  Dg Abd Acute W/chest  09/18/2013   CLINICAL DATA:  Abdominal pain  EXAM: ACUTE ABDOMEN SERIES (ABDOMEN 2 VIEW & CHEST 1 VIEW)  COMPARISON:  Prior chest x-ray 08/27/2013; prior CT abdomen/ pelvis 08/23/2013  FINDINGS: Stable cardiac and mediastinal contours. Mild diffuse interstitial prominence is similar compared to prior and therefore likely chronic. No focal airspace consolidation, pleural effusion or pneumothorax.  Nonspecific, nonobstructive bowel gas pattern. No dilatation, evidence of free air or significant air-fluid levels. Gas is noted throughout numerous loops of nondilated small bowel in the mid abdomen. Chain surgical sutures are present in the right hemi abdomen consistent with known prior right hemicolectomy and ileocolonic anastomosis. No organomegaly or abnormal calcification. No acute osseous abnormality.  IMPRESSION: Negative abdominal radiographs.  No acute cardiopulmonary disease.   Electronically Signed   By: Jacqulynn Cadet M.D.   On: 09/18/2013 19:15     EKG Interpretation None      MDM   Final diagnoses:  Pyelonephritis  Crohn disease   Patient on chronic immune suppression medications for Chron's presents with with Abdominal pain and N/V. Patient tachycardic on presentation with low grade fever of 100.3 orally. Patient appears in NAD.  UA suggestive of UTI with moderated leukocytes and many bacteria.   Mild hyponatremia to 135  Lipase negative.  Leukocytosis to 18.3. Acute abd series shows no evidence of obstruction.  US shows no evidence of cholecystitis or gall stones. Given patient hx and workup, suspect patient likely with Pyelonephritis. Due to patient immune suppressed state from chronic immunesuppressive medication, plan to admit patient for IV antibiotics and further evaluation. Patient discussed with Dr. Davonna Belling. Plan and results discussed with patient. Patient agrees with plan.  Patient started on IV rocephin.        Sherrie George, [name] 09/19/13 1432

## 2013-09-19 ENCOUNTER — Encounter (HOSPITAL_COMMUNITY): Payer: Self-pay | Admitting: Nurse Practitioner

## 2013-09-19 DIAGNOSIS — R52 Pain, unspecified: Secondary | ICD-10-CM

## 2013-09-19 DIAGNOSIS — N12 Tubulo-interstitial nephritis, not specified as acute or chronic: Secondary | ICD-10-CM

## 2013-09-19 DIAGNOSIS — I10 Essential (primary) hypertension: Secondary | ICD-10-CM

## 2013-09-19 LAB — CBC WITH DIFFERENTIAL/PLATELET
BASOS PCT: 0 % (ref 0–1)
Basophils Absolute: 0 10*3/uL (ref 0.0–0.1)
EOS ABS: 0.1 10*3/uL (ref 0.0–0.7)
Eosinophils Relative: 0 % (ref 0–5)
HCT: 35.8 % — ABNORMAL LOW (ref 39.0–52.0)
Hemoglobin: 12.3 g/dL — ABNORMAL LOW (ref 13.0–17.0)
Lymphocytes Relative: 24 % (ref 12–46)
Lymphs Abs: 3.4 10*3/uL (ref 0.7–4.0)
MCH: 31.9 pg (ref 26.0–34.0)
MCHC: 34.4 g/dL (ref 30.0–36.0)
MCV: 92.7 fL (ref 78.0–100.0)
Monocytes Absolute: 2 10*3/uL — ABNORMAL HIGH (ref 0.1–1.0)
Monocytes Relative: 14 % — ABNORMAL HIGH (ref 3–12)
NEUTROS ABS: 8.6 10*3/uL — AB (ref 1.7–7.7)
NEUTROS PCT: 61 % (ref 43–77)
Platelets: 192 10*3/uL (ref 150–400)
RBC: 3.86 MIL/uL — ABNORMAL LOW (ref 4.22–5.81)
RDW: 15.8 % — ABNORMAL HIGH (ref 11.5–15.5)
WBC: 14.1 10*3/uL — ABNORMAL HIGH (ref 4.0–10.5)

## 2013-09-19 LAB — COMPREHENSIVE METABOLIC PANEL
ALT: 10 U/L (ref 0–53)
AST: 13 U/L (ref 0–37)
Albumin: 3.3 g/dL — ABNORMAL LOW (ref 3.5–5.2)
Alkaline Phosphatase: 68 U/L (ref 39–117)
BILIRUBIN TOTAL: 0.7 mg/dL (ref 0.3–1.2)
BUN: 14 mg/dL (ref 6–23)
CHLORIDE: 96 meq/L (ref 96–112)
CO2: 22 mEq/L (ref 19–32)
CREATININE: 0.98 mg/dL (ref 0.50–1.35)
Calcium: 8.5 mg/dL (ref 8.4–10.5)
GLUCOSE: 127 mg/dL — AB (ref 70–99)
Potassium: 3.2 mEq/L — ABNORMAL LOW (ref 3.7–5.3)
Sodium: 133 mEq/L — ABNORMAL LOW (ref 137–147)
Total Protein: 7.3 g/dL (ref 6.0–8.3)

## 2013-09-19 LAB — CREATININE, SERUM
Creatinine, Ser: 0.95 mg/dL (ref 0.50–1.35)
GFR calc Af Amer: >90 mL/min (ref >90)
GFR calc non Af Amer: >90 mL/min (ref >90)

## 2013-09-19 LAB — MAGNESIUM: MAGNESIUM: 2 mg/dL (ref 1.5–2.5)

## 2013-09-19 LAB — GLUCOSE, CAPILLARY: GLUCOSE-CAPILLARY: 123 mg/dL — AB (ref 70–99)

## 2013-09-19 MED ORDER — EMTRICITABINE-TENOFOVIR DF 200-300 MG PO TABS
1.0000 | ORAL_TABLET | Freq: Every day | ORAL | Status: DC
  Administered 2013-09-19 – 2013-09-21 (×3): 1 via ORAL
  Filled 2013-09-19 (×3): qty 1

## 2013-09-19 MED ORDER — ONDANSETRON HCL 4 MG PO TABS
4.0000 mg | ORAL_TABLET | Freq: Four times a day (QID) | ORAL | Status: DC | PRN
  Administered 2013-09-19: 4 mg via ORAL
  Filled 2013-09-19: qty 1

## 2013-09-19 MED ORDER — MIRTAZAPINE 15 MG PO TABS
15.0000 mg | ORAL_TABLET | Freq: Every day | ORAL | Status: DC
  Administered 2013-09-19 – 2013-09-20 (×3): 15 mg via ORAL
  Filled 2013-09-19 (×4): qty 1

## 2013-09-19 MED ORDER — POLYETHYLENE GLYCOL 3350 17 G PO PACK
17.0000 g | PACK | Freq: Every day | ORAL | Status: DC | PRN
  Filled 2013-09-19: qty 1

## 2013-09-19 MED ORDER — ALUM & MAG HYDROXIDE-SIMETH 200-200-20 MG/5ML PO SUSP: 30.0000 mL | Freq: Four times a day (QID) | ORAL | Status: DC | PRN

## 2013-09-19 MED ORDER — SODIUM CHLORIDE 0.9 % IJ SOLN
3.0000 mL | Freq: Two times a day (BID) | INTRAMUSCULAR | Status: DC
  Administered 2013-09-19 – 2013-09-21 (×6): 3 mL via INTRAVENOUS

## 2013-09-19 MED ORDER — IPRATROPIUM BROMIDE 0.02 % IN SOLN: 0.5000 mg | RESPIRATORY_TRACT | Status: DC | PRN

## 2013-09-19 MED ORDER — METOCLOPRAMIDE HCL 10 MG PO TABS
10.0000 mg | ORAL_TABLET | Freq: Three times a day (TID) | ORAL | Status: DC
  Administered 2013-09-19 – 2013-09-21 (×8): 10 mg via ORAL
  Filled 2013-09-19 (×14): qty 1

## 2013-09-19 MED ORDER — PROMETHAZINE HCL 25 MG/ML IJ SOLN
12.5000 mg | Freq: Four times a day (QID) | INTRAMUSCULAR | Status: DC | PRN
  Filled 2013-09-19: qty 1

## 2013-09-19 MED ORDER — SORBITOL 70 % SOLN
30.0000 mL | Freq: Every day | Status: DC | PRN
  Filled 2013-09-19: qty 30

## 2013-09-19 MED ORDER — ALPRAZOLAM 1 MG PO TABS
1.0000 mg | ORAL_TABLET | Freq: Two times a day (BID) | ORAL | Status: DC | PRN
  Administered 2013-09-19 – 2013-09-20 (×3): 1 mg via ORAL
  Filled 2013-09-19 (×3): qty 1

## 2013-09-19 MED ORDER — POTASSIUM CHLORIDE 10 MEQ/100ML IV SOLN: 10.0000 meq | INTRAVENOUS | Status: DC

## 2013-09-19 MED ORDER — ACETAMINOPHEN 325 MG PO TABS: 650.0000 mg | ORAL_TABLET | Freq: Four times a day (QID) | ORAL | Status: DC | PRN

## 2013-09-19 MED ORDER — MAGNESIUM CITRATE PO SOLN: 1.0000 | Freq: Once | ORAL | Status: AC | PRN

## 2013-09-19 MED ORDER — POTASSIUM CHLORIDE CRYS ER 20 MEQ PO TBCR
40.0000 meq | EXTENDED_RELEASE_TABLET | Freq: Once | ORAL | Status: AC
  Administered 2013-09-19: 40 meq via ORAL
  Filled 2013-09-19: qty 2

## 2013-09-19 MED ORDER — ACETAMINOPHEN 650 MG RE SUPP: 650.0000 mg | Freq: Four times a day (QID) | RECTAL | Status: DC | PRN

## 2013-09-19 MED ORDER — DEXTROSE 5 % IV SOLN
1.0000 g | INTRAVENOUS | Status: DC
  Administered 2013-09-19 – 2013-09-20 (×2): 1 g via INTRAVENOUS
  Filled 2013-09-19 (×3): qty 10

## 2013-09-19 MED ORDER — PROMETHAZINE HCL 25 MG/ML IJ SOLN
12.5000 mg | Freq: Four times a day (QID) | INTRAMUSCULAR | Status: DC | PRN
  Administered 2013-09-19: 12.5 mg via INTRAVENOUS

## 2013-09-19 MED ORDER — SODIUM CHLORIDE 0.9 % IV SOLN
INTRAVENOUS | Status: DC
  Administered 2013-09-19: via INTRAVENOUS
  Administered 2013-09-19 (×2): 1000 mL via INTRAVENOUS
  Administered 2013-09-19 – 2013-09-20 (×2): via INTRAVENOUS

## 2013-09-19 MED ORDER — ENOXAPARIN SODIUM 40 MG/0.4ML ~~LOC~~ SOLN
40.0000 mg | SUBCUTANEOUS | Status: DC
  Administered 2013-09-19 – 2013-09-21 (×3): 40 mg via SUBCUTANEOUS
  Filled 2013-09-19 (×3): qty 0.4

## 2013-09-19 MED ORDER — ALBUTEROL SULFATE (2.5 MG/3ML) 0.083% IN NEBU: 2.5000 mg | INHALATION_SOLUTION | RESPIRATORY_TRACT | Status: DC | PRN

## 2013-09-19 MED ORDER — PAROXETINE HCL 20 MG PO TABS
40.0000 mg | ORAL_TABLET | Freq: Every day | ORAL | Status: DC
  Administered 2013-09-19 – 2013-09-21 (×3): 40 mg via ORAL
  Filled 2013-09-19 (×4): qty 2

## 2013-09-19 MED ORDER — PAROXETINE HCL 20 MG PO TABS: 40.0000 mg | ORAL_TABLET | Freq: Every day | ORAL | Status: DC

## 2013-09-19 MED ORDER — OXYCODONE HCL 5 MG PO TABS
5.0000 mg | ORAL_TABLET | ORAL | Status: DC | PRN
  Administered 2013-09-19: 5 mg via ORAL
  Filled 2013-09-19 (×2): qty 1

## 2013-09-19 MED ORDER — MORPHINE SULFATE 2 MG/ML IJ SOLN
2.0000 mg | INTRAMUSCULAR | Status: DC | PRN
  Administered 2013-09-19 – 2013-09-20 (×6): 2 mg via INTRAVENOUS
  Filled 2013-09-19 (×6): qty 1

## 2013-09-19 MED ORDER — DOCUSATE SODIUM 100 MG PO CAPS
100.0000 mg | ORAL_CAPSULE | Freq: Two times a day (BID) | ORAL | Status: DC
  Administered 2013-09-19 (×3): 100 mg via ORAL
  Filled 2013-09-19 (×7): qty 1

## 2013-09-19 MED ORDER — ONDANSETRON HCL 4 MG/2ML IJ SOLN
4.0000 mg | Freq: Four times a day (QID) | INTRAMUSCULAR | Status: DC | PRN
  Administered 2013-09-19: 4 mg via INTRAVENOUS
  Filled 2013-09-19: qty 2

## 2013-09-19 NOTE — Progress Notes (Signed)
[facility] PROGRESS NOTE  [name] [name] YTK:[id number] DOB: 1975-06-24 DOA: 09/18/2013 PCP: [name], [name]  Assessment/Plan  Severe sepsis due to pyelonephritis with possible associated prostatitis. He continues to have abdominal pain and he may be having some referred pain from the scrotum. CAT scan was negative for abscess or Crohn's flare. -  Followup urine culture -  Followup blood cultures - Continue Rocephin  Dehydration, continue IV fluids  Nausea and vomiting likely related to pain and urinary tract infection. -  D/c zofran and phenergan -  Start reglan  leukocytosis due to urinary tract infection. Chest x-ray negative. -  White blood cell count trending down on Rocephin -  Repeat CBC in a.m.  Crohn's disease, stable. -  Holding immunosuppression we will treat for urinary tract infection.  Hypertension blood pressures continued to be low normal despite holding blood pressure medications Continue to hold blood pressure medications for now.  Depression and anxiety, stable. Continue mirtazapine, Paxil, and Xanax when necessary  Diet:  Regular Access:  PIV IVF:  Yes Proph:  Lovenox  Code Status: Full Family Communication: paitent and friend  Disposition Plan: pending able to tolerate po, possibly home in next 1-2 days.   Consultants:   none  Procedures:   CT scan abdomen and pelvis  chest x-ray  Antibiotics:   ceftriaxone 3/30 (late evening) >>  HPI/Subjective:  Feeling a little better.  Ate some [place] for lunch, but still having considerable pain RLQ and nausea.    Objective: Filed Vitals:   09/19/13 0011 09/19/13 0432 09/19/13 0505 09/19/13 1000  BP: 122/73  97/58 105/59  Pulse: 73  67 77  Temp: 98.1 F (36.7 C)  97.2 F (36.2 C) 97.6 F (36.4 C)  TempSrc: Oral  Oral Oral  Resp: 16  16 18   Height: 5' 9"  (1.753 m)     Weight: 95.845 kg (211 lb 4.8 oz) 95.119 kg (209 lb 11.2 oz)    SpO2: 98%  100% 99%    Intake/Output  Summary (Last 24 hours) at 09/19/13 1313 Last data filed at 09/19/13 0900  Gross per 24 hour  Intake 1390.25 ml  Output    775 ml  Net 615.25 ml   Filed Weights   09/19/13 0011 09/19/13 0432  Weight: 95.845 kg (211 lb 4.8 oz) 95.119 kg (209 lb 11.2 oz)    Exam:   General:  Obese white male, No acute distress  HEENT:  NCAT, MMM  Cardiovascular:  RRR, nl S1, S2 no mrg, 2+ pulses, warm extremities  Respiratory:  CTAB, no increased WOB  Abdomen:   NABS, soft, mild TTP RLQ without rebound or guarding, nondistended  MSK:   Normal tone and bulk, no LEE  Neuro:  Grossly intact  Data Reviewed: Basic Metabolic Panel:  Recent Labs Lab 09/18/13 1857 09/19/13 0144  NA 135* 133*  K 3.8 3.2*  CL 94* 96  CO2 23 22  GLUCOSE 110* 127*  BUN 16 14  CREATININE 1.07 0.95  0.98  CALCIUM 9.9 8.5  MG  --  2.0   Liver Function Tests:  Recent Labs Lab 09/18/13 1857 09/19/13 0144  AST 19 13  ALT 15 10  ALKPHOS 138* 68  BILITOT 0.9 0.7  PROT 9.1* 7.3  ALBUMIN 4.0 3.3*    Recent Labs Lab 09/18/13 1857  LIPASE 19   No results found for this basename: AMMONIA,  in the last 168 hours CBC:  Recent Labs Lab 09/18/13 1857 09/19/13 0144  WBC 18.3* 14.1*  NEUTROABS 13.0* 8.6*  HGB 14.0 12.3*  HCT 40.5 35.8*  MCV 92.0 92.7  PLT 243 192   Cardiac Enzymes: No results found for this basename: CKTOTAL, CKMB, CKMBINDEX, TROPONINI,  in the last 168 hours BNP (last 3 results) No results found for this basename: PROBNP,  in the last 8760 hours CBG:  Recent Labs Lab 09/19/13 0731  GLUCAP 123*    No results found for this or any previous visit (from the past 240 hour(s)).   Studies: US Abdomen Complete  09/18/2013   CLINICAL DATA:  Abdominal pain.  EXAM: ULTRASOUND ABDOMEN COMPLETE  COMPARISON:  10/16/2010  FINDINGS: Gallbladder:  No gallstones or wall thickening visualized. No sonographic Murphy sign noted.  Common bile duct:  Diameter: 5 mm.  Where visualized, no  filling defect.  Liver:  No focal lesion identified. Within normal limits in parenchymal echogenicity.  IVC:  No abnormality visualized.  Pancreas:  Not visualize due to shadowing bowel gas.  Spleen:  Size and appearance within normal limits.  Right Kidney:  Length: 13 cm. Echogenicity within normal limits. No mass or hydronephrosis visualized.  Left Kidney:  Length: 13 cm. Echogenicity within normal limits. No mass or hydronephrosis visualized. Known 1 cm cyst in the interpolar region not visualized.  Abdominal aorta:  No aneurysm visualized.  Other findings:  None.  IMPRESSION: Negative abdominal ultrasound.   Electronically Signed   By: Jorje Guild M.D.   On: 09/18/2013 20:56   Ct Abdomen Pelvis W Contrast  09/19/2013   CLINICAL DATA:  38 year old male with a history of Crohn's disease. Lower abdominal pain and diarrhea.  EXAM: CT ABDOMEN AND PELVIS WITH CONTRAST  TECHNIQUE: Multidetector CT imaging of the abdomen and pelvis was performed using the standard protocol following bolus administration of intravenous contrast.  CONTRAST:  39m OMNIPAQUE IOHEXOL 300 MG/ML SOLN, 1025mOMNIPAQUE IOHEXOL 300 MG/ML SOLN  COMPARISON:  Recent prior CT abdomen/ pelvis 08/23/2013. Ultrasound performed earlier today.  FINDINGS: Lower Chest: The lung bases are clear. Visualized cardiac structures are within normal limits for size. No pericardial effusion. Unremarkable visualized distal thoracic esophagus.  Abdomen: Unremarkable CT appearance of the stomach, duodenum, spleen, adrenal glands and pancreas. Normal hepatic contour and morphology. No discrete hepatic lesion. Gallbladder is unremarkable. No intra or extrahepatic biliary ductal dilatation. Unremarkable appearance of the bilateral kidneys. No focal solid lesion, hydronephrosis or nephrolithiasis. Stable 1 cm renal cyst exophytic from the interpolar left kidney.  Surgical changes of prior right hemicolectomy with widely patent ileocolonic anastomosis. No evidence of  bowel obstruction or focal bowel wall thickening. Contrast material passes through the small bowel and into the colon. No free fluid or suspicious adenopathy.  Pelvis: Unremarkable bladder, prostate gland and seminal vesicles. Compared to 08/23/2013, interval development of bilateral iliac chain adenopathy. On series 3, index nodes include 1 cm right common iliac (image 47) 11 mm right external iliac (image 57) and a 9 mm left external iliac (image 63). There is subtle haziness about the newly enlarged lymph nodes suggesting active adenitis. Nonspecific coarse central calcifications in the prostate gland. The seminal vesicles appear enlarged, hyperemic and demonstrates subtle surrounding interstitial stranding. The prostate gland also appears slightly hazy about the margins.  Bones/Soft Tissues: No acute fracture or aggressive appearing lytic or blastic osseous lesion. Multiple small midline ventral abdominal hernias containing omental fat.  Vascular: No significant atherosclerotic vascular disease, aneurysmal dilatation or acute abnormality.  IMPRESSION: 1. Compared to 08/23/2013 there has been interval development of bilateral iliac chain pelvic  adenopathy with a subtle haziness around the newly enlarged lymph nodes suggesting acute/active adenitis. This distribution suggests a genitourinary source. The prostate gland and seminal vesicles also appear more prominent and ill-defined in previously seen with a suggestion of subtle surrounding interstitial inflammation. Query acute prostatitis? There is no significant adenopathy in the mesentery or upper retroperitoneum to suggest a gastrointestinal source. 2. Stable appearance of a multifocal small omental fat containing ventral abdominal hernias. 3. Surgical changes of prior partial right colectomy with a widely patent ileocolonic anastomosis. No evidence of bowel obstruction or active Crohn's disease.   Electronically Signed   By: Jacqulynn Cadet M.D.   On:  09/19/2013 00:36   Dg Abd Acute W/chest  09/18/2013   CLINICAL DATA:  Abdominal pain  EXAM: ACUTE ABDOMEN SERIES (ABDOMEN 2 VIEW & CHEST 1 VIEW)  COMPARISON:  Prior chest x-ray 08/27/2013; prior CT abdomen/ pelvis 08/23/2013  FINDINGS: Stable cardiac and mediastinal contours. Mild diffuse interstitial prominence is similar compared to prior and therefore likely chronic. No focal airspace consolidation, pleural effusion or pneumothorax.  Nonspecific, nonobstructive bowel gas pattern. No dilatation, evidence of free air or significant air-fluid levels. Gas is noted throughout numerous loops of nondilated small bowel in the mid abdomen. Chain surgical sutures are present in the right hemi abdomen consistent with known prior right hemicolectomy and ileocolonic anastomosis. No organomegaly or abnormal calcification. No acute osseous abnormality.  IMPRESSION: Negative abdominal radiographs.  No acute cardiopulmonary disease.   Electronically Signed   By: Jacqulynn Cadet M.D.   On: 09/18/2013 19:15    Scheduled Meds: . cefTRIAXone (ROCEPHIN)  IV  1 g Intravenous Q24H  . docusate sodium  100 mg Oral BID  . emtricitabine-tenofovir  1 tablet Oral Daily  . enoxaparin (LOVENOX) injection  40 mg Subcutaneous Q24H  . mirtazapine  15 mg Oral QHS  . PARoxetine  40 mg Oral Daily  . sodium chloride  3 mL Intravenous Q12H   Continuous Infusions: . sodium chloride 1,000 mL (09/19/13 0730)    Principal Problem:   Abdominal pain, acute Active Problems:   Obesity, unspecified   ANXIETY   DEPRESSION   OBSTRUCTIVE SLEEP APNEA   Essential hypertension, benign   CROHN'S DISEASE, LARGE AND SMALL INTESTINES   Immunosuppression   Nausea with vomiting   UTI (lower urinary tract infection)   Leukocytosis   Abdominal pain    Time spent: 30 min    Chrisanne Loose, [facility] Pager [phone]. If 7PM-7AM, please contact night-coverage at [email or web address], password [facility] 09/19/2013, 1:13 PM  LOS: 1 day

## 2013-09-19 NOTE — Progress Notes (Signed)
Nutrition Brief Note  Patient identified on the Malnutrition Screening Tool (MST) Report  Wt Readings from Last 15 Encounters:  09/19/13 209 lb 11.2 oz (95.119 kg)  08/23/13 217 lb (98.431 kg)  08/22/13 217 lb (98.431 kg)  07/28/13 217 lb 8 oz (98.657 kg)  06/30/13 208 lb 2 oz (94.405 kg)  06/27/13 211 lb 12 oz (96.049 kg)  06/05/13 207 lb (93.895 kg)  06/02/13 207 lb 6.4 oz (94.076 kg)  05/17/13 216 lb 6 oz (98.147 kg)  05/09/13 216 lb 3.2 oz (98.068 kg)  04/10/13 214 lb (97.07 kg)  04/06/13 215 lb 3 oz (97.608 kg)  04/05/13 212 lb 4 oz (96.276 kg)  03/06/13 220 lb (99.791 kg)  02/22/13 220 lb 8 oz (100.018 kg)    Body mass index is 30.95 kg/(m^2). Patient meets criteria for Obesity I based on current BMI.   Current diet order is Regualr, patient is consuming approximately >75% of meals at this time. Labs and medications reviewed.   Pt reported decreased appetite since Friday (3/27). Had frequent episodes of nausea/vomiting. Was unable to tolerate any solids or liquids. Endorsed an unintentional wt loss of 7 lbs. Nausea is currently resolving, was able to tolerate solids foods at breakfast. Has hx of Crohn's, is knowledgeable about diet and able to select off regular menu to avoid flares  No nutrition interventions warranted at this time. If nutrition issues arise, please consult RD.   Atlee Abide MS RD LDN Clinical Dietitian [phone]

## 2013-09-19 NOTE — Care Management Note (Addendum)
    Page 1 of 1   09/20/2013     3:23:45 PM   CARE MANAGEMENT NOTE 09/20/2013  Patient:  [name] [name], [name] [name]   Account Number:  [id number]  Date Initiated:  09/19/2013  Documentation initiated by:  [name] [name]  Subjective/Objective Assessment:   38 Y/O M ADMITTED W/ABD PAIN,N/V.WT:KTCCE'Q,FDVOUZHQUI,QNV     Action/Plan:   FROM HOME.HAS PCP,PHARMACY.   Anticipated DC Date:  09/21/2013   Anticipated DC Plan:  [place]  CM consult      Choice offered to / List presented to:             Status of service:  In process, will continue to follow Medicare Important Message given?   (If response is "NO", the following Medicare IM given date fields will be blank) Date Medicare IM given:   Date Additional Medicare IM given:    Discharge Disposition:    Per UR Regulation:  Reviewed for med. necessity/level of care/duration of stay  If discussed at [place] of Stay Meetings, dates discussed:    Comments:  09/20/13 [facility] RN,BSN NCM [phone] 09/19/13.INFORMED INSURANCE VERIFIER ALSO.INFORMED PATIENT-VERY AAPPRECIATIVE-ALREADY GIVEN PATIENT INSURANCE [employer] CARD INFO FOR ACCESS TO HEALTH INSURANCE/HE HAS A PCP/$4[facility] MED LIST.NO FURTHER D/C NEEDS.  09/19/13 [name] Niederer RN,BSN NCM [phone] PER INSURANCE VERIFIER-HAS INSURANCE COVERAGE EFFECTIVE 03/22/13.HAS SCRIPT COVERAGE.PROVIDED W/INSURANCE WEBSITE AS RESOURCE, $4 [facility] MED LIST.PATIENT STATES HIS INSURANCE WILL TERMINATE 09/20/13-WILL CHECK AGAIN TOMORROW.PATIENT STATES HE WORKS PART TIME @ [employer] 20HRS/WK-IF YOU GO BELOW 20HRS YOU LOOSE THE INSURANCE.PROVIDED PATIENT W/INSURANCE MEMBER TEL#[phone] TO CALL & CONFIRM INSURANCE STATUS ALSO.HE WAS APPRECIATIVE OF INFORMATION.

## 2013-09-19 NOTE — Telephone Encounter (Signed)
He is admitted with pyelonephritis

## 2013-09-20 LAB — BASIC METABOLIC PANEL
BUN: 11 mg/dL (ref 6–23)
CHLORIDE: 104 meq/L (ref 96–112)
CO2: 24 mEq/L (ref 19–32)
Calcium: 8.1 mg/dL — ABNORMAL LOW (ref 8.4–10.5)
Creatinine, Ser: 0.85 mg/dL (ref 0.50–1.35)
GFR calc Af Amer: >90 mL/min (ref >90)
GFR calc non Af Amer: >90 mL/min (ref >90)
Glucose, Bld: 97 mg/dL (ref 70–99)
Potassium: 4 mEq/L (ref 3.7–5.3)
Sodium: 138 mEq/L (ref 137–147)

## 2013-09-20 LAB — CBC
HEMATOCRIT: 34.2 % — AB (ref 39.0–52.0)
Hemoglobin: 11.3 g/dL — ABNORMAL LOW (ref 13.0–17.0)
MCH: 31.5 pg (ref 26.0–34.0)
MCHC: 33 g/dL (ref 30.0–36.0)
MCV: 95.3 fL (ref 78.0–100.0)
Platelets: 189 10*3/uL (ref 150–400)
RBC: 3.59 MIL/uL — AB (ref 4.22–5.81)
RDW: 15.9 % — AB (ref 11.5–15.5)
WBC: 5.8 10*3/uL (ref 4.0–10.5)

## 2013-09-20 LAB — GLUCOSE, CAPILLARY: Glucose-Capillary: 91 mg/dL (ref 70–99)

## 2013-09-20 MED ORDER — OXYCODONE HCL ER 10 MG PO T12A
10.0000 mg | EXTENDED_RELEASE_TABLET | Freq: Two times a day (BID) | ORAL | Status: DC
  Administered 2013-09-20 – 2013-09-21 (×2): 10 mg via ORAL
  Filled 2013-09-20 (×2): qty 1

## 2013-09-20 MED ORDER — OXYCODONE HCL 5 MG PO TABS
5.0000 mg | ORAL_TABLET | ORAL | Status: DC | PRN
  Administered 2013-09-21: 5 mg via ORAL
  Filled 2013-09-20: qty 1

## 2013-09-20 NOTE — Progress Notes (Signed)
[facility] PROGRESS NOTE  KALIQ LEGE GXQ:[id number] DOB: [birth year] DOA: 09/18/2013 PCP: [name], [name]  Assessment/Plan: Principal Problem:   Abdominal pain, acute - secondary to UTI, resolving on current antibiotic regimen. - Will discontinue IV morphine and place on oral pain regimen. Titrate as necessary - CAT scan of abdomen reported no evidence of bowel obstruction or active Crohn's disease  UTI - growing E coli but sensitivities pending - continue IV ceftriaxone until sensitivities available. - improving daily - CT scan did mention findings suspicious for acute prostatitis   Active Problems:   ANXIETY - stable continue current regimen    DEPRESSION - stable continue paxil     CROHN'S DISEASE, LARGE AND SMALL INTESTINES - Pt to continue routine f/u with GI specialist as outpatient.  Code Status: full Family Communication: discussed directly with patient. Disposition Plan: Pending urine sensitivities. Will most likely require a prolonged antibiotic regimen given findings suspicious for prostatitis.   Consultants:  None  Procedures:  CT of abdomen/pelvis  Antibiotics:  Rocephin   HPI/Subjective: No new complaints. No acute issues reported overnight.  Objective: Filed Vitals:   09/20/13 1434  BP: 122/76  Pulse: 73  Temp: 98 F (36.7 C)  Resp: 16    Intake/Output Summary (Last 24 hours) at 09/20/13 1624 Last data filed at 09/20/13 1500  Gross per 24 hour  Intake 2647.92 ml  Output    425 ml  Net 2222.92 ml   Filed Weights   09/19/13 0011 09/19/13 0432 09/20/13 0444  Weight: 95.845 kg (211 lb 4.8 oz) 95.119 kg (209 lb 11.2 oz) 97.75 kg (215 lb 8 oz)    Exam:   General:  Pt in NAD, alert and awake  Cardiovascular: RRR, no MRG  Respiratory: CTA BL, no wheezes  Abdomen: negaive CVA tenderness, soft, obese, + suprapubic discomfort  Musculoskeletal: no cyanosis or clubbing   Data Reviewed: Basic Metabolic  Panel:  Recent Labs Lab 09/18/13 1857 09/19/13 0144 09/20/13 0508  NA 135* 133* 138  K 3.8 3.2* 4.0  CL 94* 96 104  CO2 23 22 24   GLUCOSE 110* 127* 97  BUN 16 14 11   CREATININE 1.07 0.95  0.98 0.85  CALCIUM 9.9 8.5 8.1*  MG  --  2.0  --    Liver Function Tests:  Recent Labs Lab 09/18/13 1857 09/19/13 0144  AST 19 13  ALT 15 10  ALKPHOS 138* 68  BILITOT 0.9 0.7  PROT 9.1* 7.3  ALBUMIN 4.0 3.3*    Recent Labs Lab 09/18/13 1857  LIPASE 19   No results found for this basename: AMMONIA,  in the last 168 hours CBC:  Recent Labs Lab 09/18/13 1857 09/19/13 0144 09/20/13 0508  WBC 18.3* 14.1* 5.8  NEUTROABS 13.0* 8.6*  --   HGB 14.0 12.3* 11.3*  HCT 40.5 35.8* 34.2*  MCV 92.0 92.7 95.3  PLT 243 192 189   Cardiac Enzymes: No results found for this basename: CKTOTAL, CKMB, CKMBINDEX, TROPONINI,  in the last 168 hours BNP (last 3 results) No results found for this basename: PROBNP,  in the last 8760 hours CBG:  Recent Labs Lab 09/19/13 0731 09/20/13 0748  GLUCAP 123* 91    Recent Results (from the past 240 hour(s))  URINE CULTURE     Status: None   Collection Time    09/18/13  7:13 PM      Result Value Ref Range Status   Specimen Description URINE, CLEAN CATCH   Final   Special Requests  NONE   Final   Culture  Setup Time     Final   Value: 09/19/2013 02:58     Performed at [place]     Final   Value: 15,000 COLONIES/ML     Performed at [facility]   Culture     Final   Value: ESCHERICHIA COLI     Performed at [facility]   Report Status PENDING   Incomplete  CULTURE, BLOOD (ROUTINE X 2)     Status: None   Collection Time    09/18/13 11:00 PM      Result Value Ref Range Status   Specimen Description BLOOD LEFT ANTECUBITAL   Final   Special Requests BOTTLES DRAWN AEROBIC AND ANAEROBIC 5CC   Final   Culture  Setup Time     Final   Value: 09/19/2013 05:12     Performed at [facility]    Culture     Final   Value:        BLOOD CULTURE RECEIVED NO GROWTH TO DATE CULTURE WILL BE HELD FOR 5 DAYS BEFORE ISSUING A FINAL NEGATIVE REPORT     Performed at [facility]   Report Status PENDING   Incomplete  CULTURE, BLOOD (ROUTINE X 2)     Status: None   Collection Time    09/18/13 11:07 PM      Result Value Ref Range Status   Specimen Description BLOOD RIGHT ANTECUBITAL   Final   Special Requests BOTTLES DRAWN AEROBIC AND ANAEROBIC 3CC3   Final   Culture  Setup Time     Final   Value: 09/19/2013 05:12     Performed at [facility]   Culture     Final   Value:        BLOOD CULTURE RECEIVED NO GROWTH TO DATE CULTURE WILL BE HELD FOR 5 DAYS BEFORE ISSUING A FINAL NEGATIVE REPORT     Performed at [facility]   Report Status PENDING   Incomplete     Studies: US Abdomen Complete  09/18/2013   CLINICAL DATA:  Abdominal pain.  EXAM: ULTRASOUND ABDOMEN COMPLETE  COMPARISON:  10/16/2010  FINDINGS: Gallbladder:  No gallstones or wall thickening visualized. No sonographic Murphy sign noted.  Common bile duct:  Diameter: 5 mm.  Where visualized, no filling defect.  Liver:  No focal lesion identified. Within normal limits in parenchymal echogenicity.  IVC:  No abnormality visualized.  Pancreas:  Not visualize due to shadowing bowel gas.  Spleen:  Size and appearance within normal limits.  Right Kidney:  Length: 13 cm. Echogenicity within normal limits. No mass or hydronephrosis visualized.  Left Kidney:  Length: 13 cm. Echogenicity within normal limits. No mass or hydronephrosis visualized. Known 1 cm cyst in the interpolar region not visualized.  Abdominal aorta:  No aneurysm visualized.  Other findings:  None.  IMPRESSION: Negative abdominal ultrasound.   Electronically Signed   By: Jorje Guild M.D.   On: 09/18/2013 20:56   Ct Abdomen Pelvis W Contrast  09/19/2013   CLINICAL DATA:  38 year old male with a history of Crohn's disease. Lower abdominal pain and diarrhea.   EXAM: CT ABDOMEN AND PELVIS WITH CONTRAST  TECHNIQUE: Multidetector CT imaging of the abdomen and pelvis was performed using the standard protocol following bolus administration of intravenous contrast.  CONTRAST:  55m OMNIPAQUE IOHEXOL 300 MG/ML SOLN, 1027mOMNIPAQUE IOHEXOL 300 MG/ML SOLN  COMPARISON:  Recent prior CT  abdomen/ pelvis 08/23/2013. Ultrasound performed earlier today.  FINDINGS: Lower Chest: The lung bases are clear. Visualized cardiac structures are within normal limits for size. No pericardial effusion. Unremarkable visualized distal thoracic esophagus.  Abdomen: Unremarkable CT appearance of the stomach, duodenum, spleen, adrenal glands and pancreas. Normal hepatic contour and morphology. No discrete hepatic lesion. Gallbladder is unremarkable. No intra or extrahepatic biliary ductal dilatation. Unremarkable appearance of the bilateral kidneys. No focal solid lesion, hydronephrosis or nephrolithiasis. Stable 1 cm renal cyst exophytic from the interpolar left kidney.  Surgical changes of prior right hemicolectomy with widely patent ileocolonic anastomosis. No evidence of bowel obstruction or focal bowel wall thickening. Contrast material passes through the small bowel and into the colon. No free fluid or suspicious adenopathy.  Pelvis: Unremarkable bladder, prostate gland and seminal vesicles. Compared to 08/23/2013, interval development of bilateral iliac chain adenopathy. On series 3, index nodes include 1 cm right common iliac (image 47) 11 mm right external iliac (image 57) and a 9 mm left external iliac (image 63). There is subtle haziness about the newly enlarged lymph nodes suggesting active adenitis. Nonspecific coarse central calcifications in the prostate gland. The seminal vesicles appear enlarged, hyperemic and demonstrates subtle surrounding interstitial stranding. The prostate gland also appears slightly hazy about the margins.  Bones/Soft Tissues: No acute fracture or aggressive  appearing lytic or blastic osseous lesion. Multiple small midline ventral abdominal hernias containing omental fat.  Vascular: No significant atherosclerotic vascular disease, aneurysmal dilatation or acute abnormality.  IMPRESSION: 1. Compared to 08/23/2013 there has been interval development of bilateral iliac chain pelvic adenopathy with a subtle haziness around the newly enlarged lymph nodes suggesting acute/active adenitis. This distribution suggests a genitourinary source. The prostate gland and seminal vesicles also appear more prominent and ill-defined in previously seen with a suggestion of subtle surrounding interstitial inflammation. Query acute prostatitis? There is no significant adenopathy in the mesentery or upper retroperitoneum to suggest a gastrointestinal source. 2. Stable appearance of a multifocal small omental fat containing ventral abdominal hernias. 3. Surgical changes of prior partial right colectomy with a widely patent ileocolonic anastomosis. No evidence of bowel obstruction or active Crohn's disease.   Electronically Signed   By: Jacqulynn Cadet M.D.   On: 09/19/2013 00:36   Dg Abd Acute W/chest  09/18/2013   CLINICAL DATA:  Abdominal pain  EXAM: ACUTE ABDOMEN SERIES (ABDOMEN 2 VIEW & CHEST 1 VIEW)  COMPARISON:  Prior chest x-ray 08/27/2013; prior CT abdomen/ pelvis 08/23/2013  FINDINGS: Stable cardiac and mediastinal contours. Mild diffuse interstitial prominence is similar compared to prior and therefore likely chronic. No focal airspace consolidation, pleural effusion or pneumothorax.  Nonspecific, nonobstructive bowel gas pattern. No dilatation, evidence of free air or significant air-fluid levels. Gas is noted throughout numerous loops of nondilated small bowel in the mid abdomen. Chain surgical sutures are present in the right hemi abdomen consistent with known prior right hemicolectomy and ileocolonic anastomosis. No organomegaly or abnormal calcification. No acute osseous  abnormality.  IMPRESSION: Negative abdominal radiographs.  No acute cardiopulmonary disease.   Electronically Signed   By: Jacqulynn Cadet M.D.   On: 09/18/2013 19:15    Scheduled Meds: . cefTRIAXone (ROCEPHIN)  IV  1 g Intravenous Q24H  . docusate sodium  100 mg Oral BID  . emtricitabine-tenofovir  1 tablet Oral Daily  . enoxaparin (LOVENOX) injection  40 mg Subcutaneous Q24H  . metoCLOPramide  10 mg Oral TID AC & HS  . mirtazapine  15 mg Oral QHS  .  PARoxetine  40 mg Oral Daily  . sodium chloride  3 mL Intravenous Q12H   Continuous Infusions: . sodium chloride 125 mL/hr at 09/20/13 1683     Time spent: > 35 minutes    Velvet Bathe  [facility] Pager [phone]. If 7PM-7AM, please contact night-coverage at [email or web address], password [facility] 09/20/2013, 4:24 PM  LOS: 2 days

## 2013-09-21 LAB — CBC
HEMATOCRIT: 32.9 % — AB (ref 39.0–52.0)
Hemoglobin: 10.7 g/dL — ABNORMAL LOW (ref 13.0–17.0)
MCH: 31 pg (ref 26.0–34.0)
MCHC: 32.5 g/dL (ref 30.0–36.0)
MCV: 95.4 fL (ref 78.0–100.0)
Platelets: 196 10*3/uL (ref 150–400)
RBC: 3.45 MIL/uL — AB (ref 4.22–5.81)
RDW: 15.8 % — ABNORMAL HIGH (ref 11.5–15.5)
WBC: 5.9 10*3/uL (ref 4.0–10.5)

## 2013-09-21 LAB — GLUCOSE, CAPILLARY: GLUCOSE-CAPILLARY: 111 mg/dL — AB (ref 70–99)

## 2013-09-21 LAB — BASIC METABOLIC PANEL
BUN: 10 mg/dL (ref 6–23)
CHLORIDE: 106 meq/L (ref 96–112)
CO2: 26 mEq/L (ref 19–32)
Calcium: 8.4 mg/dL (ref 8.4–10.5)
Creatinine, Ser: 0.98 mg/dL (ref 0.50–1.35)
GFR calc Af Amer: >90 mL/min (ref >90)
GLUCOSE: 89 mg/dL (ref 70–99)
POTASSIUM: 4.2 meq/L (ref 3.7–5.3)
SODIUM: 140 meq/L (ref 137–147)

## 2013-09-21 LAB — URINE CULTURE

## 2013-09-21 MED ORDER — CIPROFLOXACIN HCL 500 MG PO TABS: 500.0000 mg | ORAL_TABLET | Freq: Two times a day (BID) | ORAL | Status: DC

## 2013-09-21 MED ORDER — OXYCODONE HCL 5 MG PO TABS: 5.0000 mg | ORAL_TABLET | ORAL | Status: DC | PRN

## 2013-09-21 MED ORDER — LEVOFLOXACIN 500 MG PO TABS: 500.0000 mg | ORAL_TABLET | Freq: Every day | ORAL | Status: DC

## 2013-09-21 NOTE — ED Provider Notes (Signed)
Medical screening examination/treatment/procedure(s) were conducted as a shared visit with non-physician practitioner(s) and myself.  I personally evaluated the patient during the encounter.   EKG Interpretation None     Patient's abdominal pain. Urinalysis shows UTI. With immunosuppression patient will require admission. Discussed with hospitalist, will put the patient, but requested CT scan.  Jasper Riling. Alvino Chapel, MD 09/21/13 [phone]

## 2013-09-21 NOTE — Discharge Summary (Signed)
Physician Discharge Summary  [name] [name]:[id number] DOB: 22-Aug-1975 DOA: 09/18/2013  PCP: [name], [name]  Admit date: 09/18/2013 Discharge date: 09/21/2013  Time spent: > 60  minutes  Recommendations for Outpatient Follow-up:  1. Patient to follow up with PCP within 2 weeks 2. Patient to follow up with GI specialist within 3 weeks 3. Complete oral Cipro (4 days) for total antibiotic treatment of 7 days.  Discharge Diagnoses:  Principal Problem:   Abdominal pain, acute Active Problems:   Obesity, unspecified   ANXIETY   DEPRESSION   OBSTRUCTIVE SLEEP APNEA   Essential hypertension, benign   CROHN'S DISEASE, LARGE AND SMALL INTESTINES   Immunosuppression   Nausea with vomiting   UTI (lower urinary tract infection)   Leukocytosis   Abdominal pain   Discharge Condition: Stable, improved  Diet recommendation: Low sodium heart healthy  Filed Weights   09/19/13 0432 09/20/13 0444 09/21/13 0500  Weight: 95.119 kg (209 lb 11.2 oz) 97.75 kg (215 lb 8 oz) 97.024 kg (213 lb 14.4 oz)    History of present illness:  38 year old male with a history of Crohn's disease, anxiety, depression, hypertension, duodenitis who presented to the ED on 3/30 with a three day history of nausea, vomiting, and right-sided abdominal pain. He was having fever, chills, and some generalized weakness. He denies any constipation or diarrhea.   In the ED he had leukocytosis and sodium on 135. His urinalysis was turbid with moderate leukocytosis and many bacteria. The acute abdominal series was negative and the abdominal ultrasound was unremarkable. He was started on Rocephin and admitted for further evaluation and management  Hospital Course:   Principal Problem:  Abdominal pain, acute  - secondary to UTI, resolving on antibiotic regimen.  - Placed on Oxycontin and oxycodone while inpatient. Discharged on oxycodone PRN  - CT scan of abdomen reported no evidence of bowel obstruction or active  Crohn's disease   Nausea and vomiting: - Resolved - likely related to pain and UTI - Rehydrated with IVF  Hypertension: - Blood pressures soft while inpatient - Hold blood pressure medications for now  UTI/Prostatitis - growing E coli -Pan sensitive - Continues to improve - CT scan did mention findings suspicious for acute prostatitis as such will d/c on Levaquin 500 mg po daily to complete a 6 wk antibiotic course.  Active Problems:  ANXIETY  - stable, continue current regimen   DEPRESSION  - stable, continue paxil   CROHN'S DISEASE, LARGE AND SMALL INTESTINES  - Pt to continue routine f/u with GI specialist as outpatient.    Procedures:  CT abdomen and pelvis  Consultations:  None  Antibiotics: - Rocephin x 3 days in patient - D/c on Cipro x 4 days for total abx treatment of 7 days  Discharge Exam: Filed Vitals:   09/21/13 1340  BP: 127/74  Pulse: 74  Temp: 97.6 F (36.4 C)  Resp: 18    General: pt in NAD, alert and awake Cardiovascular: RRR, no MRG Respiratory: CTA BL, no wheezes  Discharge Instructions      Discharge Orders   Future Orders Complete By Expires   Call MD for:  persistant nausea and vomiting  As directed    Call MD for:  severe uncontrolled pain  As directed    Call MD for:  temperature >100.4  As directed    Diet - low sodium heart healthy  As directed    Discharge instructions  As directed    Comments:  Follow up with PCP within 2 weeks. Follow up with GI specialist within 3 weeks.   Increase activity slowly  As directed        Medication List    STOP taking these medications       ibuprofen 200 MG tablet  Commonly known as:  ADVIL,MOTRIN     lisinopril-hydrochlorothiazide 20-25 MG per tablet  Commonly known as:  PRINZIDE,ZESTORETIC      TAKE these medications       adalimumab 40 MG/0.8ML injection  Commonly known as:  HUMIRA PEN  Inject 0.8 mLs (40 mg total) into the skin every 14 (fourteen) days. After  starter kit complete     ALPRAZolam 1 MG tablet  Commonly known as:  XANAX  Take 1 tablet (1 mg total) by mouth 2 (two) times daily as needed for anxiety.     ciprofloxacin 500 MG tablet  Commonly known as:  CIPRO  Take 1 tablet (500 mg total) by mouth 2 (two) times daily.     emtricitabine-tenofovir 200-300 MG per tablet  Commonly known as:  TRUVADA  Take 1 tablet by mouth daily.     mirtazapine 15 MG tablet  Commonly known as:  REMERON  Take 15 mg by mouth at bedtime.     ondansetron 4 MG tablet  Commonly known as:  ZOFRAN  Take 1 tablet (4 mg total) by mouth every 6 (six) hours.     oxyCODONE 5 MG immediate release tablet  Commonly known as:  Oxy IR/ROXICODONE  Take 1 tablet (5 mg total) by mouth every 4 (four) hours as needed for breakthrough pain.     PARoxetine 20 MG tablet  Commonly known as:  PAXIL  Take 40 mg by mouth daily.       No Known Allergies    The results of significant diagnostics from this hospitalization (including imaging, microbiology, ancillary and laboratory) are listed below for reference.    Significant Diagnostic Studies: Dg Chest 2 View  08/27/2013   CLINICAL DATA:  A 5 day history of cough  EXAM: CHEST  2 VIEW  COMPARISON:  CT ANGIO CHEST W/CM &/OR WO/CM dated 04/06/2013; DG CHEST 2 VIEW dated 04/06/2013  FINDINGS: The lungs are adequately inflated. There is no focal infiltrate. The cardiac silhouette is normal in size. The pulmonary vascularity is not engorged. The mediastinum is normal in width. There is no pleural effusion or pneumothorax. The trachea is midline. The observed portions of the bony thorax appear normal.  IMPRESSION: There is no evidence of active cardiopulmonary disease. One cannot exclude acute bronchitis in the appropriate clinical setting.   Electronically Signed   By: David  Martinique   On: 08/27/2013 11:17   US Abdomen Complete  09/18/2013   CLINICAL DATA:  Abdominal pain.  EXAM: ULTRASOUND ABDOMEN COMPLETE  COMPARISON:   10/16/2010  FINDINGS: Gallbladder:  No gallstones or wall thickening visualized. No sonographic Murphy sign noted.  Common bile duct:  Diameter: 5 mm.  Where visualized, no filling defect.  Liver:  No focal lesion identified. Within normal limits in parenchymal echogenicity.  IVC:  No abnormality visualized.  Pancreas:  Not visualize due to shadowing bowel gas.  Spleen:  Size and appearance within normal limits.  Right Kidney:  Length: 13 cm. Echogenicity within normal limits. No mass or hydronephrosis visualized.  Left Kidney:  Length: 13 cm. Echogenicity within normal limits. No mass or hydronephrosis visualized. Known 1 cm cyst in the interpolar region not visualized.  Abdominal aorta:  No  aneurysm visualized.  Other findings:  None.  IMPRESSION: Negative abdominal ultrasound.   Electronically Signed   By: Jorje Guild M.D.   On: 09/18/2013 20:56   Ct Abdomen Pelvis W Contrast  09/19/2013   CLINICAL DATA:  38 year old male with a history of Crohn's disease. Lower abdominal pain and diarrhea.  EXAM: CT ABDOMEN AND PELVIS WITH CONTRAST  TECHNIQUE: Multidetector CT imaging of the abdomen and pelvis was performed using the standard protocol following bolus administration of intravenous contrast.  CONTRAST:  23m OMNIPAQUE IOHEXOL 300 MG/ML SOLN, 1011mOMNIPAQUE IOHEXOL 300 MG/ML SOLN  COMPARISON:  Recent prior CT abdomen/ pelvis 08/23/2013. Ultrasound performed earlier today.  FINDINGS: Lower Chest: The lung bases are clear. Visualized cardiac structures are within normal limits for size. No pericardial effusion. Unremarkable visualized distal thoracic esophagus.  Abdomen: Unremarkable CT appearance of the stomach, duodenum, spleen, adrenal glands and pancreas. Normal hepatic contour and morphology. No discrete hepatic lesion. Gallbladder is unremarkable. No intra or extrahepatic biliary ductal dilatation. Unremarkable appearance of the bilateral kidneys. No focal solid lesion, hydronephrosis or  nephrolithiasis. Stable 1 cm renal cyst exophytic from the interpolar left kidney.  Surgical changes of prior right hemicolectomy with widely patent ileocolonic anastomosis. No evidence of bowel obstruction or focal bowel wall thickening. Contrast material passes through the small bowel and into the colon. No free fluid or suspicious adenopathy.  Pelvis: Unremarkable bladder, prostate gland and seminal vesicles. Compared to 08/23/2013, interval development of bilateral iliac chain adenopathy. On series 3, index nodes include 1 cm right common iliac (image 47) 11 mm right external iliac (image 57) and a 9 mm left external iliac (image 63). There is subtle haziness about the newly enlarged lymph nodes suggesting active adenitis. Nonspecific coarse central calcifications in the prostate gland. The seminal vesicles appear enlarged, hyperemic and demonstrates subtle surrounding interstitial stranding. The prostate gland also appears slightly hazy about the margins.  Bones/Soft Tissues: No acute fracture or aggressive appearing lytic or blastic osseous lesion. Multiple small midline ventral abdominal hernias containing omental fat.  Vascular: No significant atherosclerotic vascular disease, aneurysmal dilatation or acute abnormality.  IMPRESSION: 1. Compared to 08/23/2013 there has been interval development of bilateral iliac chain pelvic adenopathy with a subtle haziness around the newly enlarged lymph nodes suggesting acute/active adenitis. This distribution suggests a genitourinary source. The prostate gland and seminal vesicles also appear more prominent and ill-defined in previously seen with a suggestion of subtle surrounding interstitial inflammation. Query acute prostatitis? There is no significant adenopathy in the mesentery or upper retroperitoneum to suggest a gastrointestinal source. 2. Stable appearance of a multifocal small omental fat containing ventral abdominal hernias. 3. Surgical changes of prior partial  right colectomy with a widely patent ileocolonic anastomosis. No evidence of bowel obstruction or active Crohn's disease.   Electronically Signed   By: HeJacqulynn Cadet.D.   On: 09/19/2013 00:36   Ct Abdomen Pelvis W Contrast  08/23/2013   CLINICAL DATA:  Abdominal pain. History of Crohn's, small bowel obstruction  EXAM: CT ABDOMEN AND PELVIS WITH CONTRAST  TECHNIQUE: Multidetector CT imaging of the abdomen and pelvis was performed using the standard protocol following bolus administration of intravenous contrast.  CONTRAST:  10049mMNIPAQUE IOHEXOL 300 MG/ML  SOLN  COMPARISON:  06/07/2013  FINDINGS: BODY WALL: 2 supraumbilical midline hernias, containing fat - similar to prior.  LOWER CHEST: Mild atelectasis at the bases.  ABDOMEN/PELVIS:  Liver: No focal abnormality.  Biliary: No evidence of biliary obstruction or stone.  Pancreas: Unremarkable.  Spleen: Unremarkable.  Adrenals: Unremarkable.  Kidneys and ureters: Presumed cyst from the interpolar left kidney, 12 mm in diameter. The cyst is more collapsed on today's exam. No hydronephrosis or nephrolithiasis.  Bladder: Unremarkable.  Reproductive: Unremarkable.  Bowel: Postsurgical changes of terminal ileum and proximal colon resection. There is a entero colonic anastomosis at the hepatic flexure. No inflamed segments of small bowel or colon. No evidence of bowel obstruction, stricture, or fistula. No abscess.  Retroperitoneum: Unchanged size of prominent nodes in the remaining ileocolic chain.  Peritoneum: No free fluid or gas.  Vascular: No acute abnormality.  OSSEOUS: No acute abnormalities.  IMPRESSION: No evidence of bowel obstruction or active Crohn's enteritis.   Electronically Signed   By: Jorje Guild M.D.   On: 08/23/2013 04:58   [place] Extrem Low Right Ltd  08/22/2013   MSK US performed of: Right foot  This study was ordered, performed, and interpreted by Charlann Boxer D.O.  Foot/Ankle:  All structures visualized.  Talar dome unremarkable  Ankle  mortise without effusion.  Peroneus longus and brevis tendons unremarkable on long and transverse  views without sheath effusions.  Posterior tibialis, flexor hallucis longus, and flexor digitorum longus  tendons unremarkable on long and transverse views without sheath  effusions.  Achilles tendon visualized along length of tendon and unremarkable on long  and transverse views without sheath effusion.  Anterior Talofibular Ligament and Calcaneofibular Ligaments unremarkable  and intact.  Deltoid Ligament unremarkable and intact.  Plantar fascia intact a but significant hypoechoic changes. Patient's  plantar fascia measures 1.39 cm. Questionable fibroma noted.   08/22/2013   Plantar fasciitis measured 1.3 cm   Dg Abd Acute W/chest  09/18/2013   CLINICAL DATA:  Abdominal pain  EXAM: ACUTE ABDOMEN SERIES (ABDOMEN 2 VIEW & CHEST 1 VIEW)  COMPARISON:  Prior chest x-ray 08/27/2013; prior CT abdomen/ pelvis 08/23/2013  FINDINGS: Stable cardiac and mediastinal contours. Mild diffuse interstitial prominence is similar compared to prior and therefore likely chronic. No focal airspace consolidation, pleural effusion or pneumothorax.  Nonspecific, nonobstructive bowel gas pattern. No dilatation, evidence of free air or significant air-fluid levels. Gas is noted throughout numerous loops of nondilated small bowel in the mid abdomen. Chain surgical sutures are present in the right hemi abdomen consistent with known prior right hemicolectomy and ileocolonic anastomosis. No organomegaly or abnormal calcification. No acute osseous abnormality.  IMPRESSION: Negative abdominal radiographs.  No acute cardiopulmonary disease.   Electronically Signed   By: Jacqulynn Cadet M.D.   On: 09/18/2013 19:15    Microbiology: Recent Results (from the past 240 hour(s))  URINE CULTURE     Status: None   Collection Time    09/18/13  7:13 PM      Result Value Ref Range Status   Specimen Description URINE, CLEAN CATCH   Final   Special  Requests NONE   Final   Culture  Setup Time     Final   Value: 09/19/2013 02:58     Performed at [facility]     Final   Value: 15,000 COLONIES/ML     Performed at [facility]   Culture     Final   Value: ESCHERICHIA COLI     Performed at [facility]   Report Status 09/21/2013 FINAL   Final   Organism ID, Bacteria ESCHERICHIA COLI   Final  CULTURE, BLOOD (ROUTINE X 2)     Status: None   Collection Time  09/18/13 11:00 PM      Result Value Ref Range Status   Specimen Description BLOOD LEFT ANTECUBITAL   Final   Special Requests BOTTLES DRAWN AEROBIC AND ANAEROBIC 5CC   Final   Culture  Setup Time     Final   Value: 09/19/2013 05:12     Performed at [facility]   Culture     Final   Value:        BLOOD CULTURE RECEIVED NO GROWTH TO DATE CULTURE WILL BE HELD FOR 5 DAYS BEFORE ISSUING A FINAL NEGATIVE REPORT     Performed at [facility]   Report Status PENDING   Incomplete  CULTURE, BLOOD (ROUTINE X 2)     Status: None   Collection Time    09/18/13 11:07 PM      Result Value Ref Range Status   Specimen Description BLOOD RIGHT ANTECUBITAL   Final   Special Requests BOTTLES DRAWN AEROBIC AND ANAEROBIC 3CC3   Final   Culture  Setup Time     Final   Value: 09/19/2013 05:12     Performed at [facility]   Culture     Final   Value:        BLOOD CULTURE RECEIVED NO GROWTH TO DATE CULTURE WILL BE HELD FOR 5 DAYS BEFORE ISSUING A FINAL NEGATIVE REPORT     Performed at [facility]   Report Status PENDING   Incomplete     Labs: Basic Metabolic Panel:  Recent Labs Lab 09/18/13 1857 09/19/13 0144 09/20/13 0508 09/21/13 0443  NA 135* 133* 138 140  K 3.8 3.2* 4.0 4.2  CL 94* 96 104 106  CO2 23 22 24 26   GLUCOSE 110* 127* 97 89  BUN 16 14 11 10   CREATININE 1.07 0.95  0.98 0.85 0.98  CALCIUM 9.9 8.5 8.1* 8.4  MG  --  2.0  --   --    Liver Function Tests:  Recent Labs Lab 09/18/13 1857  09/19/13 0144  AST 19 13  ALT 15 10  ALKPHOS 138* 68  BILITOT 0.9 0.7  PROT 9.1* 7.3  ALBUMIN 4.0 3.3*    Recent Labs Lab 09/18/13 1857  LIPASE 19   No results found for this basename: AMMONIA,  in the last 168 hours CBC:  Recent Labs Lab 09/18/13 1857 09/19/13 0144 09/20/13 0508 09/21/13 0443  WBC 18.3* 14.1* 5.8 5.9  NEUTROABS 13.0* 8.6*  --   --   HGB 14.0 12.3* 11.3* 10.7*  HCT 40.5 35.8* 34.2* 32.9*  MCV 92.0 92.7 95.3 95.4  PLT 243 192 189 196   Cardiac Enzymes: No results found for this basename: CKTOTAL, CKMB, CKMBINDEX, TROPONINI,  in the last 168 hours BNP: BNP (last 3 results) No results found for this basename: PROBNP,  in the last 8760 hours CBG:  Recent Labs Lab 09/19/13 0731 09/20/13 0748 09/21/13 0747  GLUCAP 123* 91 111*       Signed:  Doreene Nest L  [facility] 09/21/2013, 1:44 PM

## 2013-09-25 LAB — CULTURE, BLOOD (ROUTINE X 2)
CULTURE: NO GROWTH
Culture: NO GROWTH

## 2013-10-02 ENCOUNTER — Telehealth: Payer: Self-pay | Admitting: Internal Medicine

## 2013-10-02 DIAGNOSIS — R109 Unspecified abdominal pain: Secondary | ICD-10-CM

## 2013-10-02 NOTE — Telephone Encounter (Signed)
Dr. Carlean Purl see recent admission notes.  Ok to take Humira?

## 2013-10-04 ENCOUNTER — Encounter: Payer: Self-pay | Admitting: Internal Medicine

## 2013-10-04 ENCOUNTER — Other Ambulatory Visit (INDEPENDENT_AMBULATORY_CARE_PROVIDER_SITE_OTHER): Payer: Self-pay

## 2013-10-04 DIAGNOSIS — R109 Unspecified abdominal pain: Secondary | ICD-10-CM

## 2013-10-04 LAB — URINALYSIS, ROUTINE W REFLEX MICROSCOPIC
BILIRUBIN URINE: NEGATIVE
HGB URINE DIPSTICK: NEGATIVE
Ketones, ur: NEGATIVE
LEUKOCYTES UA: NEGATIVE
Nitrite: NEGATIVE
PH: 6 (ref 5.0–8.0)
Specific Gravity, Urine: 1.01 (ref 1.000–1.030)
TOTAL PROTEIN, URINE-UPE24: NEGATIVE
Urine Glucose: NEGATIVE
Urobilinogen, UA: 0.2 (ref 0.0–1.0)

## 2013-10-04 NOTE — Telephone Encounter (Signed)
Check UA and will decide Is he is on cipro x 6 weeks?

## 2013-10-04 NOTE — Progress Notes (Signed)
Quick Note:  UA clear to restart Humira ______

## 2013-10-04 NOTE — Telephone Encounter (Signed)
Patient notified via my chart message.  See other communication from today

## 2013-10-09 ENCOUNTER — Emergency Department (HOSPITAL_COMMUNITY): Payer: Self-pay

## 2013-10-09 ENCOUNTER — Encounter (HOSPITAL_COMMUNITY): Payer: Self-pay | Admitting: Emergency Medicine

## 2013-10-09 ENCOUNTER — Emergency Department (HOSPITAL_COMMUNITY)
Admission: EM | Admit: 2013-10-09 | Discharge: 2013-10-09 | Disposition: A | Discharge status: Home / Self Care | Payer: Self-pay | Attending: Emergency Medicine | Admitting: Emergency Medicine

## 2013-10-09 ENCOUNTER — Emergency Department (INDEPENDENT_AMBULATORY_CARE_PROVIDER_SITE_OTHER)
Admission: EM | Admit: 2013-10-09 | Discharge: 2013-10-09 | Disposition: A | Discharge status: Other Acute Inpatient Hospital | Payer: BC Managed Care – PPO | Source: Home / Self Care

## 2013-10-09 DIAGNOSIS — Y92410 Unspecified street and highway as the place of occurrence of the external cause: Secondary | ICD-10-CM | POA: Insufficient documentation

## 2013-10-09 DIAGNOSIS — F411 Generalized anxiety disorder: Secondary | ICD-10-CM | POA: Insufficient documentation

## 2013-10-09 DIAGNOSIS — Y9389 Activity, other specified: Secondary | ICD-10-CM | POA: Insufficient documentation

## 2013-10-09 DIAGNOSIS — S0993XA Unspecified injury of face, initial encounter: Secondary | ICD-10-CM | POA: Insufficient documentation

## 2013-10-09 DIAGNOSIS — M542 Cervicalgia: Secondary | ICD-10-CM

## 2013-10-09 DIAGNOSIS — I10 Essential (primary) hypertension: Secondary | ICD-10-CM | POA: Insufficient documentation

## 2013-10-09 DIAGNOSIS — Z8719 Personal history of other diseases of the digestive system: Secondary | ICD-10-CM | POA: Insufficient documentation

## 2013-10-09 DIAGNOSIS — Z792 Long term (current) use of antibiotics: Secondary | ICD-10-CM | POA: Insufficient documentation

## 2013-10-09 DIAGNOSIS — Z8709 Personal history of other diseases of the respiratory system: Secondary | ICD-10-CM | POA: Insufficient documentation

## 2013-10-09 DIAGNOSIS — Z041 Encounter for examination and observation following transport accident: Secondary | ICD-10-CM

## 2013-10-09 DIAGNOSIS — R55 Syncope and collapse: Secondary | ICD-10-CM | POA: Insufficient documentation

## 2013-10-09 DIAGNOSIS — F3289 Other specified depressive episodes: Secondary | ICD-10-CM | POA: Insufficient documentation

## 2013-10-09 DIAGNOSIS — Z8669 Personal history of other diseases of the nervous system and sense organs: Secondary | ICD-10-CM | POA: Insufficient documentation

## 2013-10-09 DIAGNOSIS — Z87442 Personal history of urinary calculi: Secondary | ICD-10-CM | POA: Insufficient documentation

## 2013-10-09 DIAGNOSIS — Z79899 Other long term (current) drug therapy: Secondary | ICD-10-CM | POA: Insufficient documentation

## 2013-10-09 DIAGNOSIS — M79641 Pain in right hand: Secondary | ICD-10-CM

## 2013-10-09 DIAGNOSIS — S199XXA Unspecified injury of neck, initial encounter: Secondary | ICD-10-CM

## 2013-10-09 DIAGNOSIS — R51 Headache: Secondary | ICD-10-CM

## 2013-10-09 DIAGNOSIS — M79609 Pain in unspecified limb: Secondary | ICD-10-CM

## 2013-10-09 DIAGNOSIS — F329 Major depressive disorder, single episode, unspecified: Secondary | ICD-10-CM | POA: Insufficient documentation

## 2013-10-09 DIAGNOSIS — IMO0002 Reserved for concepts with insufficient information to code with codable children: Secondary | ICD-10-CM | POA: Insufficient documentation

## 2013-10-09 DIAGNOSIS — Z8619 Personal history of other infectious and parasitic diseases: Secondary | ICD-10-CM | POA: Insufficient documentation

## 2013-10-09 DIAGNOSIS — E669 Obesity, unspecified: Secondary | ICD-10-CM | POA: Insufficient documentation

## 2013-10-09 DIAGNOSIS — Z043 Encounter for examination and observation following other accident: Secondary | ICD-10-CM

## 2013-10-09 DIAGNOSIS — R519 Headache, unspecified: Secondary | ICD-10-CM

## 2013-10-09 LAB — I-STAT CHEM 8, ED
BUN: 9 mg/dL (ref 6–23)
Calcium, Ion: 1.16 mmol/L (ref 1.12–1.23)
Chloride: 99 mEq/L (ref 96–112)
Creatinine, Ser: 1 mg/dL (ref 0.50–1.35)
Glucose, Bld: 89 mg/dL (ref 70–99)
HEMATOCRIT: 41 % (ref 39.0–52.0)
Hemoglobin: 13.9 g/dL (ref 13.0–17.0)
POTASSIUM: 3.7 meq/L (ref 3.7–5.3)
SODIUM: 138 meq/L (ref 137–147)
TCO2: 29 mmol/L (ref 0–100)

## 2013-10-09 LAB — CBG MONITORING, ED: Glucose-Capillary: 101 mg/dL — ABNORMAL HIGH (ref 70–99)

## 2013-10-09 MED ORDER — MORPHINE SULFATE 4 MG/ML IJ SOLN
4.0000 mg | Freq: Once | INTRAMUSCULAR | Status: AC
  Administered 2013-10-09: 4 mg via INTRAVENOUS
  Filled 2013-10-09: qty 1
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
## 2013-10-09 NOTE — ED Notes (Signed)
SOFT  CERVICAL  COLLAR    APPLIED
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
## 2013-10-11 ENCOUNTER — Ambulatory Visit (INDEPENDENT_AMBULATORY_CARE_PROVIDER_SITE_OTHER): Payer: Self-pay | Admitting: Internal Medicine

## 2013-10-11 ENCOUNTER — Encounter: Payer: Self-pay | Admitting: Internal Medicine

## 2013-10-11 VITALS — BP 110/78 | HR 72 | Ht 67.0 in | Wt 214.0 lb

## 2013-10-11 DIAGNOSIS — R55 Syncope and collapse: Secondary | ICD-10-CM

## 2013-10-11 DIAGNOSIS — K5080 Crohn's disease of both small and large intestine without complications: Secondary | ICD-10-CM

## 2013-10-11 DIAGNOSIS — R112 Nausea with vomiting, unspecified: Secondary | ICD-10-CM

## 2013-10-11 DIAGNOSIS — F411 Generalized anxiety disorder: Secondary | ICD-10-CM

## 2013-10-11 DIAGNOSIS — R1031 Right lower quadrant pain: Secondary | ICD-10-CM

## 2013-10-11 MED ORDER — OXYCODONE HCL 5 MG PO TABS: 5.0000 mg | ORAL_TABLET | ORAL | Status: DC | PRN

## 2013-10-11 MED ORDER — ALPRAZOLAM 2 MG PO TABS: 1.0000 mg | ORAL_TABLET | Freq: Two times a day (BID) | ORAL | Status: DC | PRN

## 2013-10-11 NOTE — Patient Instructions (Addendum)
Today we are providing you with printed rx's for xanax and oxy to take to your pharmacy.   Follow up with [place] in 2 months.  We have made you an appointment with Dr. Golden Hurter for May 13th 2015 at 2:00pm, please arrive at 1:30pm.  They are located at [address], [place].  They are going to mail you new patient paperwork.   I appreciate the opportunity to care for you.   Faxed work note per patient request to [phone], ATT: SARA

## 2013-10-11 NOTE — Assessment & Plan Note (Signed)
Cause not clear. I think this merits at least a cardiology consultation to see if he needs some sort of the event monitor or something. Will make a referral.

## 2013-10-11 NOTE — ED Provider Notes (Signed)
Medical screening examination/treatment/procedure(s) were performed by a resident physician or non-physician practitioner and as the supervising physician I was immediately available for consultation/collaboration.  Lynne Leader, MD    Gregor Hams, MD 10/11/13 1325

## 2013-10-11 NOTE — Assessment & Plan Note (Addendum)
Waiting to see if he can get Humira free I suspect his right lower quadrant pain and nausea vomiting or combination of his pyelonephritis Crohn's and anxiety. Ongoing to treat this with oxycodone #30, 5 mg strength. No refills. He is going to try his Zofran which she had at home before we prescribed Reglan which is an option. Levaquin for which he is on due to the pyelonephritis can help of his Crohn's is well. Will start Humira when he can. Depending upon how he does a repeat CT scan scan could be needed followup lymphadenopathy et Ronney Asters. It is attributed to his infection and is most likely the case but somebody with Crohn's disease and chronic immunosuppressive therapy is at risk for other problems including lymphoma so need to consider following this it up at some point pending upon his clinical course.

## 2013-10-11 NOTE — Assessment & Plan Note (Signed)
Ongoing issue for him. In order for him to have a more cost effective treatment he can split 2 mg Xanax and use those.
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
## 2013-10-13 ENCOUNTER — Other Ambulatory Visit: Payer: Self-pay | Admitting: Nurse Practitioner

## 2013-10-26 ENCOUNTER — Emergency Department (HOSPITAL_COMMUNITY)
Admission: EM | Admit: 2013-10-26 | Discharge: 2013-10-26 | Disposition: A | Discharge status: Home / Self Care | Payer: Self-pay | Attending: Emergency Medicine | Admitting: Emergency Medicine

## 2013-10-26 ENCOUNTER — Encounter (HOSPITAL_COMMUNITY): Payer: Self-pay | Admitting: Emergency Medicine

## 2013-10-26 ENCOUNTER — Emergency Department (HOSPITAL_COMMUNITY): Payer: Self-pay

## 2013-10-26 DIAGNOSIS — Z79899 Other long term (current) drug therapy: Secondary | ICD-10-CM | POA: Insufficient documentation

## 2013-10-26 DIAGNOSIS — F411 Generalized anxiety disorder: Secondary | ICD-10-CM | POA: Insufficient documentation

## 2013-10-26 DIAGNOSIS — E669 Obesity, unspecified: Secondary | ICD-10-CM | POA: Insufficient documentation

## 2013-10-26 DIAGNOSIS — Z87442 Personal history of urinary calculi: Secondary | ICD-10-CM | POA: Insufficient documentation

## 2013-10-26 DIAGNOSIS — Z792 Long term (current) use of antibiotics: Secondary | ICD-10-CM | POA: Insufficient documentation

## 2013-10-26 DIAGNOSIS — F3289 Other specified depressive episodes: Secondary | ICD-10-CM | POA: Insufficient documentation

## 2013-10-26 DIAGNOSIS — F329 Major depressive disorder, single episode, unspecified: Secondary | ICD-10-CM | POA: Insufficient documentation

## 2013-10-26 DIAGNOSIS — Z8669 Personal history of other diseases of the nervous system and sense organs: Secondary | ICD-10-CM | POA: Insufficient documentation

## 2013-10-26 DIAGNOSIS — Z8619 Personal history of other infectious and parasitic diseases: Secondary | ICD-10-CM | POA: Insufficient documentation

## 2013-10-26 DIAGNOSIS — Z8719 Personal history of other diseases of the digestive system: Secondary | ICD-10-CM | POA: Insufficient documentation

## 2013-10-26 DIAGNOSIS — IMO0002 Reserved for concepts with insufficient information to code with codable children: Secondary | ICD-10-CM | POA: Insufficient documentation

## 2013-10-26 DIAGNOSIS — I10 Essential (primary) hypertension: Secondary | ICD-10-CM | POA: Insufficient documentation

## 2013-10-26 DIAGNOSIS — J40 Bronchitis, not specified as acute or chronic: Secondary | ICD-10-CM | POA: Insufficient documentation

## 2013-10-26 LAB — BASIC METABOLIC PANEL
BUN: 15 mg/dL (ref 6–23)
CO2: 25 meq/L (ref 19–32)
CREATININE: 1.07 mg/dL (ref 0.50–1.35)
Calcium: 9.1 mg/dL (ref 8.4–10.5)
Chloride: 99 mEq/L (ref 96–112)
GFR calc Af Amer: >90 mL/min (ref >90)
GFR calc non Af Amer: 86 mL/min — ABNORMAL LOW (ref >90)
Glucose, Bld: 109 mg/dL — ABNORMAL HIGH (ref 70–99)
Potassium: 5.2 mEq/L (ref 3.7–5.3)
Sodium: 135 mEq/L — ABNORMAL LOW (ref 137–147)

## 2013-10-26 LAB — CBC
HEMATOCRIT: 39.6 % (ref 39.0–52.0)
HEMOGLOBIN: 13.5 g/dL (ref 13.0–17.0)
MCH: 32.5 pg (ref 26.0–34.0)
MCHC: 34.1 g/dL (ref 30.0–36.0)
MCV: 95.2 fL (ref 78.0–100.0)
Platelets: 207 10*3/uL (ref 150–400)
RBC: 4.16 MIL/uL — AB (ref 4.22–5.81)
RDW: 15.2 % (ref 11.5–15.5)
WBC: 8.5 10*3/uL (ref 4.0–10.5)

## 2013-10-26 LAB — I-STAT TROPONIN, ED: Troponin i, poc: 0 ng/mL (ref 0.00–0.08)

## 2013-10-26 LAB — PRO B NATRIURETIC PEPTIDE: Pro B Natriuretic peptide (BNP): 9 pg/mL (ref 0–125)

## 2013-10-26 MED ORDER — IOHEXOL 350 MG/ML SOLN
80.0000 mL | Freq: Once | INTRAVENOUS | Status: AC | PRN
  Administered 2013-10-26: 76 mL via INTRAVENOUS

## 2013-10-26 MED ORDER — ALBUTEROL SULFATE HFA 108 (90 BASE) MCG/ACT IN AERS
2.0000 | INHALATION_SPRAY | RESPIRATORY_TRACT | Status: DC | PRN
  Administered 2013-10-26: 2 via RESPIRATORY_TRACT
  Filled 2013-10-26: qty 6.7

## 2013-10-26 MED ORDER — HYDROCODONE-ACETAMINOPHEN 7.5-325 MG/15ML PO SOLN: 10.0000 mL | Freq: Four times a day (QID) | ORAL | Status: DC | PRN

## 2013-10-26 MED ORDER — MORPHINE SULFATE 4 MG/ML IJ SOLN
4.0000 mg | Freq: Once | INTRAMUSCULAR | Status: AC
  Administered 2013-10-26: 4 mg via INTRAVENOUS
  Filled 2013-10-26: qty 1

## 2013-10-26 MED ORDER — AZITHROMYCIN 250 MG PO TABS
500.0000 mg | ORAL_TABLET | Freq: Once | ORAL | Status: AC
  Administered 2013-10-26: 500 mg via ORAL
  Filled 2013-10-26: qty 2

## 2013-10-26 MED ORDER — PREDNISONE 20 MG PO TABS: 60.0000 mg | ORAL_TABLET | Freq: Every day | ORAL | Status: DC

## 2013-10-26 MED ORDER — ONDANSETRON HCL 4 MG/2ML IJ SOLN
4.0000 mg | Freq: Once | INTRAMUSCULAR | Status: AC
  Administered 2013-10-26: 4 mg via INTRAVENOUS
  Filled 2013-10-26: qty 2

## 2013-10-26 NOTE — ED Notes (Signed)
Pt returned from CT °

## 2013-10-26 NOTE — ED Notes (Signed)
Pt has chest pain to the right side of the chest, painful to palpation.  Chest pain is intermittent that started 2 days ago.  Pt states "I can only take small little breaths, that way it will not hurt."

## 2013-10-26 NOTE — ED Notes (Signed)
Pt alert and oriented at discharge.  Pt verbalized understanding for follow up care and the use of Albuterol inhaler.

## 2013-10-26 NOTE — ED Notes (Signed)
CT notified pt was available for transport.

## 2013-10-26 NOTE — ED Notes (Signed)
Patient with cough and chest soreness for last two days.  Patient states that the soreness gets worse with cough and deep breath.  Patient does have some shortness of breath, with nausea and vomiting.  Patient states the last time he vomited was this am.  Patient is CAOx3 in triage.

## 2013-10-26 NOTE — ED Notes (Signed)
Patient transported to CT 

## 2013-10-26 NOTE — Discharge Instructions (Signed)

## 2013-10-26 NOTE — ED Notes (Signed)
Dr. Marnette Burgess at bedside.

## 2013-10-27 NOTE — ED Provider Notes (Signed)
CSN: [id number]     Arrival date & time 10/26/13  0108 History   First MD Initiated Contact with Patient 10/26/13 0258     Chief Complaint  Patient presents with  . Cough  . chest soreness      (Consider location/radiation/quality/duration/timing/severity/associated sxs/prior Treatment) HPI History per patient. Ongoing cough and shortness of breath now with chest pain. History of Crohn's and is currently on Levaquin. Patient denies any fevers. No chills. Symptoms moderate in severity. Pain is sharp. No rash. No recent travel. No known sick contacts. Onset 2 days ago. Has had some associated nausea vomiting. Past Medical History  Diagnosis Date  . Obstructive sleep apnea (adult) (pediatric)   . Anxiety and depression   . Essential hypertension, benign   . Obesity, unspecified   . Herpes zoster infection 07/2010, 12/2010    with post-herpetic neuralgia  . Vitamin D deficiency   . Crohn's disease   . Hyperlipidemia   . CMV (cytomegalovirus infection) 10/28/2010  . Nephrolithiasis   . Fistula, perirectal     history of  . Sinus infection 08/05/11  . Gastritis   . Duodenitis   . Depression   . Anxiety   . Syncope    Past Surgical History  Procedure Laterality Date  . Small intestine surgery  08/05/07    1 ft removed, ileo-cecectomy  . Hypospadias correction      Childhood   . Upper gastrointestinal endoscopy  10/15/2010    w/biopsy, mild gastritis and duodenitis  . Colonoscopy  10/14/2007    crohn's colitis, aphthous ulcers, mild anal stenosis  . Colon resection    . Tooth extraction     Family History  Problem Relation Age of Onset  . Heart disease Father     CAD/MI  . Hyperlipidemia Father   . Hypertension Father   . Gout Father   . Sleep apnea Father   . Colon cancer Neg Hx   . Diabetes Neg Hx   . COPD Neg Hx   . Obesity Mother   . Allergies Mother   . Cancer Mother   . Stroke Maternal Grandfather   . Allergies Sister   . Asthma Sister    History  Substance Use  Topics  . Smoking status: Never Smoker   . Smokeless tobacco: Never Used  . Alcohol Use: 1.8 oz/week    3 Shots of liquor per week     Comment: 3/week    Review of Systems  Constitutional: Negative for fever and chills.  Eyes: Negative for visual disturbance.  Respiratory: Negative for shortness of breath.   Cardiovascular: Positive for chest pain.  Gastrointestinal: Negative for abdominal pain.  Genitourinary: Negative for dysuria.  Musculoskeletal: Negative for back pain, neck pain and neck stiffness.  Skin: Negative for rash.  Neurological: Negative for headaches.  All other systems reviewed and are negative.     Allergies  Review of patient's allergies indicates no known allergies.  Home Medications   Prior to Admission medications   Medication Sig Start Date End Date Taking? Authorizing Provider  adalimumab (HUMIRA PEN) 40 MG/0.8ML injection Inject 0.8 mLs (40 mg total) into the skin every 14 (fourteen) days. After starter kit complete 06/30/13  Yes Gatha Mayer, MD  ALPRAZolam Duanne Moron) 2 MG tablet Take 0.5 tablets (1 mg total) by mouth 2 (two) times daily as needed for anxiety. 10/11/13  Yes Gatha Mayer, MD  emtricitabine-tenofovir (TRUVADA) 200-300 MG per tablet Take 1 tablet by mouth daily. 07/28/13  Yes  [name], [name]  levofloxacin (LEVAQUIN) 500 MG tablet Take 500 mg by mouth daily. 09/21/13 10/29/13 Yes Velvet Bathe, MD  lisinopril-hydrochlorothiazide (PRINZIDE,ZESTORETIC) 20-25 MG per tablet Take 1 tablet by mouth daily. 09/12/13  Yes Historical Provider, MD  mirtazapine (REMERON) 15 MG tablet Take 15 mg by mouth at bedtime.   Yes Historical Provider, MD  PARoxetine (PAXIL) 20 MG tablet Take 40 mg by mouth daily.   Yes Historical Provider, MD  HYDROcodone-acetaminophen (HYCET) 7.5-325 mg/15 ml solution Take 10 mLs by mouth every 6 (six) hours as needed for moderate pain. 10/26/13   Teressa Lower, MD  predniSONE (DELTASONE) 20 MG tablet Take 3 tablets (60 mg total) by  mouth daily. 10/26/13   Teressa Lower, MD   BP 96/58  Pulse 57  Temp(Src) 97.4 F (36.3 C) (Oral)  Resp 18  SpO2 96% Physical Exam  Constitutional: He is oriented to person, place, and time. He appears well-developed and well-nourished.  HENT:  Head: Normocephalic and atraumatic.  Eyes: EOM are normal. Pupils are equal, round, and reactive to light.  Neck: Neck supple.  Cardiovascular: Normal rate, regular rhythm and intact distal pulses.   Pulmonary/Chest: Effort normal and breath sounds normal. No respiratory distress. He exhibits no tenderness.  Abdominal: Soft. He exhibits no distension. There is no tenderness. There is no rebound.  Musculoskeletal: Normal range of motion. He exhibits no edema and no tenderness.  Neurological: He is alert and oriented to person, place, and time.  Skin: Skin is warm.    ED Course  Procedures (including critical care time) Labs Review Labs Reviewed  CBC - Abnormal; Notable for the following:    RBC 4.16 (*)    All other components within normal limits  BASIC METABOLIC PANEL - Abnormal; Notable for the following:    Sodium 135 (*)    Glucose, Bld 109 (*)    GFR calc non Af Amer 86 (*)    All other components within normal limits  PRO B NATRIURETIC PEPTIDE  I-STAT TROPOININ, ED    Imaging Review Dg Chest 2 View  10/26/2013   CLINICAL DATA:  Cough.  EXAM: CHEST  2 VIEW  COMPARISON:  08/27/2013  FINDINGS: Increased interstitial markings, similar to previous, with suggestion of bronchial wall thickening. No cardiomegaly. No consolidation, edema, effusion, or pneumothorax.  IMPRESSION: Bronchitic changes.  No pneumonia or edema.   Electronically Signed   By: Jorje Guild M.D.   On: 10/26/2013 01:44   Ct Angio Chest Pe W/cm &/or Wo Cm  10/26/2013   CLINICAL DATA:  Chest pain, shortness of breath, hemoptysis  EXAM: CT ANGIOGRAPHY CHEST WITH CONTRAST  TECHNIQUE: Multidetector CT imaging of the chest was performed using the standard protocol during  bolus administration of intravenous contrast. Multiplanar CT image reconstructions and MIPs were obtained to evaluate the vascular anatomy.  CONTRAST:  27m OMNIPAQUE IOHEXOL 350 MG/ML SOLN  COMPARISON:  04/06/2013.  FINDINGS: THORACIC INLET/BODY WALL:  No acute abnormality.  MEDIASTINUM:  Normal heart size. No pericardial effusion. No acute vascular abnormality, including acute pulmonary embolism. Apparent thin low-density in the anterior aspect of the right lower lobe posterior basilar segment pulmonary artery is most likely heterogeneous flow (as opposed to a remote pulmonary embolism). No adenopathy.  LUNG WINDOWS:  Mild patchy air trapping at the bases. No consolidation, edema, effusion, or pneumothorax.  UPPER ABDOMEN:  No acute findings.  OSSEOUS:  No acute fracture.  No suspicious lytic or blastic lesions.  Review of the MIP images confirms the  above findings.  IMPRESSION: 1. Negative for acute pulmonary embolism or other acute intrathoracic abnormality. 2. Mild air trapping at the bases, suggesting small airways disease.   Electronically Signed   By: Jorje Guild M.D.   On: 10/26/2013 06:06   IV morphine/ zofran pain control Albuterol inhaler 2 puffs Azithromycin for possible atypical PNA  On recheck pain improved, breathing better.  Plan d/c home, follow up outpatient. Return precautions provided and verbalized as understood.  MDM   Final diagnoses:  Bronchitis   Adult male with cough, dyspnea and pleuritic CP. CXR clear, CT scan as above. Labs reviewed. Medications provided, symptomatically improved. VS and nurses notes reviewed/ considered    Teressa Lower, MD 10/27/13 2309

## 2013-11-01 ENCOUNTER — Institutional Professional Consult (permissible substitution): Payer: Self-pay | Admitting: Cardiology

## 2013-11-20 ENCOUNTER — Encounter: Payer: Self-pay | Admitting: Cardiology

## 2013-11-22 ENCOUNTER — Institutional Professional Consult (permissible substitution): Payer: Self-pay | Admitting: Cardiology

## 2013-11-30 ENCOUNTER — Ambulatory Visit (INDEPENDENT_AMBULATORY_CARE_PROVIDER_SITE_OTHER): Payer: Self-pay | Admitting: Internal Medicine

## 2013-11-30 ENCOUNTER — Encounter: Payer: Self-pay | Admitting: Internal Medicine

## 2013-11-30 ENCOUNTER — Encounter: Payer: Self-pay | Admitting: Cardiology

## 2013-11-30 VITALS — BP 100/70 | HR 95 | Temp 98.2°F | Wt 218.0 lb

## 2013-11-30 DIAGNOSIS — R112 Nausea with vomiting, unspecified: Secondary | ICD-10-CM

## 2013-11-30 DIAGNOSIS — R1011 Right upper quadrant pain: Secondary | ICD-10-CM

## 2013-11-30 DIAGNOSIS — R197 Diarrhea, unspecified: Secondary | ICD-10-CM

## 2013-11-30 MED ORDER — PREDNISONE 10 MG PO TABS: ORAL_TABLET | ORAL | Status: DC

## 2013-11-30 MED ORDER — METRONIDAZOLE 500 MG PO TABS: 500.0000 mg | ORAL_TABLET | Freq: Three times a day (TID) | ORAL | Status: DC

## 2013-11-30 MED ORDER — MIRTAZAPINE 15 MG PO TABS: 15.0000 mg | ORAL_TABLET | Freq: Every day | ORAL | Status: DC

## 2013-11-30 MED ORDER — ALPRAZOLAM 2 MG PO TABS: 1.0000 mg | ORAL_TABLET | Freq: Two times a day (BID) | ORAL | Status: DC | PRN

## 2013-11-30 MED ORDER — LISINOPRIL-HYDROCHLOROTHIAZIDE 20-25 MG PO TABS: 1.0000 | ORAL_TABLET | Freq: Every day | ORAL | Status: DC

## 2013-11-30 MED ORDER — PROMETHAZINE HCL 12.5 MG PO TABS: 12.5000 mg | ORAL_TABLET | Freq: Three times a day (TID) | ORAL | Status: DC | PRN

## 2013-11-30 MED ORDER — PAROXETINE HCL 20 MG PO TABS: 40.0000 mg | ORAL_TABLET | Freq: Every day | ORAL | Status: DC

## 2013-11-30 NOTE — Progress Notes (Signed)
Pre visit review using our clinic review tool, if applicable. No additional management support is needed unless otherwise documented below in the visit note. 

## 2013-11-30 NOTE — Patient Instructions (Addendum)
Please take all new medication as prescribed - the antibiotic, nausea medication, and prednisone  Please continue all other medications as before, and refills have been done if requested.  Please have the pharmacy call with any other refills you may need.  Please keep your appointments with your specialists as you may have planned  Please go to the LAB in the Basement (turn left off the elevator) for the tests to be done today  You will be contacted by phone if any changes need to be made immediately.  Otherwise, you will receive a letter about your results with an explanation, but please check with MyChart first.  Please remember to sign up for MyChart if you have not done so, as this will be important to you in the future with finding out test results, communicating by private email, and scheduling acute appointments online when needed.  Please return in 6 months, or sooner if needed

## 2013-11-30 NOTE — Progress Notes (Signed)
Subjective:    Patient ID: [name] [name], male    DOB: 11-Jan-1976, 38 y.o.   MRN: [id number]  HPI  Here to f/u, lost his insurance over 1 mo ago so wants to limit testing if possible;  C/o almost 1 mo ongoing loose stools, occas cramps, bloating and pain ruq, trace BRB, nausea, less appetite, all worse in the past 48 hrs, has felt some warm, but no high fever, chills, or vomiting, dizziness.  Has held on taking his BP pill due to weakness last few days.  Has known  Hx of crohn's. Last hospd 2 mo ago, states ended up with antibx for pyelonephritis x 4 wks.  Has a partner HIV+, had been using preventive truvada , but none last few months due to loss of insurance and cost of med.  Past Medical History  Diagnosis Date  . Obstructive sleep apnea (adult) (pediatric)   . Anxiety and depression   . Essential hypertension, benign   . Obesity, unspecified   . Herpes zoster infection 07/2010, 12/2010    with post-herpetic neuralgia  . Vitamin D deficiency   . Crohn's disease   . Hyperlipidemia   . CMV (cytomegalovirus infection) 10/28/2010  . Nephrolithiasis   . Fistula, perirectal     history of  . Sinus infection 08/05/11  . Gastritis   . Duodenitis   . Depression   . Anxiety   . Syncope    Past Surgical History  Procedure Laterality Date  . Small intestine surgery  08/05/07    1 ft removed, ileo-cecectomy  . Hypospadias correction      Childhood   . Upper gastrointestinal endoscopy  10/15/2010    w/biopsy, mild gastritis and duodenitis  . Colonoscopy  10/14/2007    crohn's colitis, aphthous ulcers, mild anal stenosis  . Colon resection    . Tooth extraction      reports that he has never smoked. He has never used smokeless tobacco. He reports that he drinks about 1.8 ounces of alcohol per week. He reports that he does not use illicit drugs. family history includes Allergies in his mother and sister; Asthma in his sister; Cancer in his mother; Gout in his father; Heart disease in his  father; Hyperlipidemia in his father; Hypertension in his father; Obesity in his mother; Sleep apnea in his father; Stroke in his maternal grandfather. There is no history of Colon cancer, Diabetes, or COPD. No Known Allergies No current outpatient prescriptions on file prior to visit.   No current facility-administered medications on file prior to visit.    Review of Systems  Constitutional: Negative for unusual diaphoresis or other sweats  HENT: Negative for ringing in ear Eyes: Negative for double vision or worsening visual disturbance.  Respiratory: Negative for choking and stridor.   Gastrointestinal: Negative for vomiting or other signifcant bowel change Genitourinary: Negative for hematuria or decreased urine volume.  Musculoskeletal: Negative for other MSK pain or swelling Skin: Negative for color change and worsening wound.  Neurological: Negative for tremors and numbness other than noted  Psychiatric/Behavioral: Negative for decreased concentration or agitation other than above       Objective:   Physical Exam BP 100/70  Pulse 95  Temp(Src) 98.2 F (36.8 C) (Oral)  Wt 218 lb (98.884 kg)  SpO2 95% VS noted,  Constitutional: Pt appears well-developed, well-nourished.  HENT: Head: NCAT.  Right Ear: External ear normal.  Left Ear: External ear normal.  Eyes: . Pupils are equal, round, and reactive  to light. Conjunctivae and EOM are normal Neck: Normal range of motion. Neck supple.  Cardiovascular: Normal rate and regular rhythm.   Pulmonary/Chest: Effort normal and breath sounds normal.  Abd:  Soft,  ND, + BS with mild tender right mid adn upper quad abd, no guarding or rebound Neurological: Pt is alert. Not confused , motor grossly intact Skin: Skin is warm. No rash Psychiatric: Pt behavior is normal. No agitation. mild nervous     Assessment & Plan:

## 2013-12-03 ENCOUNTER — Emergency Department (HOSPITAL_COMMUNITY)
Admission: EM | Admit: 2013-12-03 | Discharge: 2013-12-03 | Disposition: A | Discharge status: Home / Self Care | Payer: Self-pay | Attending: Emergency Medicine | Admitting: Emergency Medicine

## 2013-12-03 ENCOUNTER — Emergency Department (HOSPITAL_COMMUNITY): Payer: Self-pay

## 2013-12-03 ENCOUNTER — Encounter (HOSPITAL_COMMUNITY): Payer: Self-pay | Admitting: Emergency Medicine

## 2013-12-03 DIAGNOSIS — F3289 Other specified depressive episodes: Secondary | ICD-10-CM | POA: Insufficient documentation

## 2013-12-03 DIAGNOSIS — I10 Essential (primary) hypertension: Secondary | ICD-10-CM | POA: Insufficient documentation

## 2013-12-03 DIAGNOSIS — R197 Diarrhea, unspecified: Secondary | ICD-10-CM

## 2013-12-03 DIAGNOSIS — Z8619 Personal history of other infectious and parasitic diseases: Secondary | ICD-10-CM | POA: Insufficient documentation

## 2013-12-03 DIAGNOSIS — R1013 Epigastric pain: Secondary | ICD-10-CM | POA: Insufficient documentation

## 2013-12-03 DIAGNOSIS — Z792 Long term (current) use of antibiotics: Secondary | ICD-10-CM | POA: Insufficient documentation

## 2013-12-03 DIAGNOSIS — F329 Major depressive disorder, single episode, unspecified: Secondary | ICD-10-CM | POA: Insufficient documentation

## 2013-12-03 DIAGNOSIS — E669 Obesity, unspecified: Secondary | ICD-10-CM | POA: Insufficient documentation

## 2013-12-03 DIAGNOSIS — Z8709 Personal history of other diseases of the respiratory system: Secondary | ICD-10-CM | POA: Insufficient documentation

## 2013-12-03 DIAGNOSIS — Z87442 Personal history of urinary calculi: Secondary | ICD-10-CM | POA: Insufficient documentation

## 2013-12-03 DIAGNOSIS — Z79899 Other long term (current) drug therapy: Secondary | ICD-10-CM | POA: Insufficient documentation

## 2013-12-03 DIAGNOSIS — K5000 Crohn's disease of small intestine without complications: Secondary | ICD-10-CM | POA: Insufficient documentation

## 2013-12-03 DIAGNOSIS — R109 Unspecified abdominal pain: Secondary | ICD-10-CM

## 2013-12-03 DIAGNOSIS — F411 Generalized anxiety disorder: Secondary | ICD-10-CM | POA: Insufficient documentation

## 2013-12-03 DIAGNOSIS — Z9889 Other specified postprocedural states: Secondary | ICD-10-CM | POA: Insufficient documentation

## 2013-12-03 LAB — CBC WITH DIFFERENTIAL/PLATELET
BASOS ABS: 0.1 10*3/uL (ref 0.0–0.1)
Basophils Relative: 1 % (ref 0–1)
EOS ABS: 0.2 10*3/uL (ref 0.0–0.7)
EOS PCT: 2 % (ref 0–5)
HCT: 33 % — ABNORMAL LOW (ref 39.0–52.0)
Hemoglobin: 11 g/dL — ABNORMAL LOW (ref 13.0–17.0)
LYMPHS PCT: 28 % (ref 12–46)
Lymphs Abs: 2.4 10*3/uL (ref 0.7–4.0)
MCH: 30.9 pg (ref 26.0–34.0)
MCHC: 33.3 g/dL (ref 30.0–36.0)
MCV: 92.7 fL (ref 78.0–100.0)
Monocytes Absolute: 1 10*3/uL (ref 0.1–1.0)
Monocytes Relative: 12 % (ref 3–12)
Neutro Abs: 5 10*3/uL (ref 1.7–7.7)
Neutrophils Relative %: 57 % (ref 43–77)
Platelets: 255 10*3/uL (ref 150–400)
RBC: 3.56 MIL/uL — ABNORMAL LOW (ref 4.22–5.81)
RDW: 13.3 % (ref 11.5–15.5)
WBC: 8.6 10*3/uL (ref 4.0–10.5)

## 2013-12-03 LAB — CLOSTRIDIUM DIFFICILE BY PCR: Toxigenic C. Difficile by PCR: NEGATIVE

## 2013-12-03 LAB — COMPREHENSIVE METABOLIC PANEL
ALBUMIN: 3.2 g/dL — AB (ref 3.5–5.2)
ALT: 15 U/L (ref 0–53)
AST: 18 U/L (ref 0–37)
Alkaline Phosphatase: 72 U/L (ref 39–117)
BUN: 17 mg/dL (ref 6–23)
CALCIUM: 8.8 mg/dL (ref 8.4–10.5)
CO2: 25 meq/L (ref 19–32)
Chloride: 102 mEq/L (ref 96–112)
Creatinine, Ser: 0.9 mg/dL (ref 0.50–1.35)
GFR calc Af Amer: >90 mL/min (ref >90)
GFR calc non Af Amer: >90 mL/min (ref >90)
Glucose, Bld: 115 mg/dL — ABNORMAL HIGH (ref 70–99)
Potassium: 3.9 mEq/L (ref 3.7–5.3)
Sodium: 139 mEq/L (ref 137–147)
TOTAL PROTEIN: 6.9 g/dL (ref 6.0–8.3)
Total Bilirubin: <0.2 mg/dL — ABNORMAL LOW (ref 0.3–1.2)

## 2013-12-03 LAB — LIPASE, BLOOD: Lipase: 190 U/L — ABNORMAL HIGH (ref 11–59)

## 2013-12-03 MED ORDER — IOHEXOL 300 MG/ML  SOLN
50.0000 mL | Freq: Once | INTRAMUSCULAR | Status: AC | PRN
  Administered 2013-12-03: 50 mL via ORAL

## 2013-12-03 MED ORDER — OXYCODONE-ACETAMINOPHEN 5-325 MG PO TABS: 1.0000 | ORAL_TABLET | ORAL | Status: DC | PRN

## 2013-12-03 MED ORDER — ONDANSETRON HCL 4 MG/2ML IJ SOLN
4.0000 mg | Freq: Once | INTRAMUSCULAR | Status: AC
  Administered 2013-12-03: 4 mg via INTRAVENOUS
  Filled 2013-12-03: qty 2

## 2013-12-03 MED ORDER — SODIUM CHLORIDE 0.9 % IV BOLUS (SEPSIS)
1000.0000 mL | Freq: Once | INTRAVENOUS | Status: AC
  Administered 2013-12-03: 1000 mL via INTRAVENOUS

## 2013-12-03 MED ORDER — METRONIDAZOLE 500 MG PO TABS: 500.0000 mg | ORAL_TABLET | Freq: Two times a day (BID) | ORAL | Status: DC

## 2013-12-03 MED ORDER — IOHEXOL 300 MG/ML  SOLN
100.0000 mL | Freq: Once | INTRAMUSCULAR | Status: AC | PRN
  Administered 2013-12-03: 100 mL via INTRAVENOUS

## 2013-12-03 MED ORDER — MORPHINE SULFATE 4 MG/ML IJ SOLN
4.0000 mg | Freq: Once | INTRAMUSCULAR | Status: AC
  Administered 2013-12-03: 4 mg via INTRAVENOUS
  Filled 2013-12-03: qty 1

## 2013-12-03 NOTE — ED Provider Notes (Addendum)
CSN: [id number]     Arrival date & time 12/03/13  0209 History   First MD Initiated Contact with Patient 12/03/13 0423     Chief Complaint  Patient presents with  . Diarrhea  . Abdominal Pain     (Consider location/radiation/quality/duration/timing/severity/associated sxs/prior Treatment) HPI This patient is a very pleasant, in his late 50s with a history of Crohn's disease. He recently finished a 6 week course of antibiotic for treatment of prostatitis.  Today, he comes in with complaints of abdominal pain and diarrhea for the last 3-4 days. Diarrhea is green and foul smelling. The patient's not had fevers. Abdominal pain localizes more to the epigastric region and right upper quadrant. The patient says that when he has a Crohn's flare he typically has pain in this region.  He has not had any nausea or vomiting. No fever. Genitourinary symptoms. Patient was seen by his primary care physician earlier in the week for the same symptoms. He was diagnosed, clinically, with C. difficile colitis and prescribed Flagyl and a five-day course of prednisone. Patient said that because of lack of financial means he did not fill his prescriptions.  The patient rates his pain 8 on a 0-to-10 scale. As cramping and nonradiating.  Past Medical History  Diagnosis Date  . Obstructive sleep apnea (adult) (pediatric)   . Anxiety and depression   . Essential hypertension, benign   . Obesity, unspecified   . Herpes zoster infection 07/2010, 12/2010    with post-herpetic neuralgia  . Vitamin D deficiency   . Crohn's disease   . Hyperlipidemia   . CMV (cytomegalovirus infection) 10/28/2010  . Nephrolithiasis   . Fistula, perirectal     history of  . Sinus infection 08/05/11  . Gastritis   . Duodenitis   . Depression   . Anxiety   . Syncope    Past Surgical History  Procedure Laterality Date  . Small intestine surgery  08/05/07    1 ft removed, ileo-cecectomy  . Hypospadias correction      Childhood    . Upper gastrointestinal endoscopy  10/15/2010    w/biopsy, mild gastritis and duodenitis  . Colonoscopy  10/14/2007    crohn's colitis, aphthous ulcers, mild anal stenosis  . Colon resection    . Tooth extraction     Family History  Problem Relation Age of Onset  . Heart disease Father     CAD/MI  . Hyperlipidemia Father   . Hypertension Father   . Gout Father   . Sleep apnea Father   . Colon cancer Neg Hx   . Diabetes Neg Hx   . COPD Neg Hx   . Obesity Mother   . Allergies Mother   . Cancer Mother   . Stroke Maternal Grandfather   . Allergies Sister   . Asthma Sister    History  Substance Use Topics  . Smoking status: Never Smoker   . Smokeless tobacco: Never Used  . Alcohol Use: 1.8 oz/week    3 Shots of liquor per week     Comment: 3/week    Review of Systems   Ten point review of symptoms performed and is negative with the exception of symptoms noted above.   Allergies  Review of patient's allergies indicates no known allergies.  Home Medications   Prior to Admission medications   Medication Sig Start Date End Date Taking? Authorizing Provider  alprazolam Duanne Moron) 2 MG tablet Take 0.5 tablets (1 mg total) by mouth 2 (two) times daily  as needed for anxiety. 11/30/13  Yes Biagio Borg, MD  mirtazapine (REMERON) 15 MG tablet Take 1 tablet (15 mg total) by mouth at bedtime. 11/30/13  Yes Biagio Borg, MD  PARoxetine (PAXIL) 20 MG tablet Take 2 tablets (40 mg total) by mouth daily. 11/30/13  Yes Biagio Borg, MD  predniSONE (DELTASONE) 10 MG tablet Take 5 mg by mouth once. 11/30/13  Yes Biagio Borg, MD  promethazine (PHENERGAN) 12.5 MG tablet Take 1 tablet (12.5 mg total) by mouth every 8 (eight) hours as needed for nausea or vomiting. 11/30/13  Yes Biagio Borg, MD  lisinopril-hydrochlorothiazide (PRINZIDE,ZESTORETIC) 20-25 MG per tablet Take 1 tablet by mouth daily. 11/30/13   Biagio Borg, MD  metroNIDAZOLE (FLAGYL) 500 MG tablet Take 1 tablet (500 mg total) by mouth  3 (three) times daily. 11/30/13   Biagio Borg, MD   BP 119/66  Pulse 69  Temp(Src) 98.7 F (37.1 C) (Oral)  Resp 18  Ht 5' 9"  (1.753 m)  Wt 217 lb (98.431 kg)  BMI 32.03 kg/m2  SpO2 99% Physical Exam Gen: well developed and well nourished appearing Head: NCAT Eyes: PERL, EOMI Nose: no epistaixis or rhinorrhea Mouth/throat: mucosa is moist and pink Neck: supple, no stridor Lungs: CTA B, no wheezing, rhonchi or rales CV: RRR, no murmur, extremities appear well perfused.  Abd: soft, tender RUQ, nondistended Back: no ttp, no cva ttp Skin: warm and dry Ext: normal to inspection, no dependent edema Neuro: CN ii-xii grossly intact, no focal deficits Psyche; normal affect,  calm and cooperative.   ED Course  Procedures (including critical care time) Labs Review  Results for orders placed during the hospital encounter of 12/03/13 (from the past 24 hour(s))  CBC WITH DIFFERENTIAL     Status: Abnormal   Collection Time    12/03/13  4:25 AM      Result Value Ref Range   WBC 8.6  4.0 - 10.5 K/uL   RBC 3.56 (*) 4.22 - 5.81 MIL/uL   Hemoglobin 11.0 (*) 13.0 - 17.0 g/dL   HCT 33.0 (*) 39.0 - 52.0 %   MCV 92.7  78.0 - 100.0 fL   MCH 30.9  26.0 - 34.0 pg   MCHC 33.3  30.0 - 36.0 g/dL   RDW 13.3  11.5 - 15.5 %   Platelets 255  150 - 400 K/uL   Neutrophils Relative % 57  43 - 77 %   Neutro Abs 5.0  1.7 - 7.7 K/uL   Lymphocytes Relative 28  12 - 46 %   Lymphs Abs 2.4  0.7 - 4.0 K/uL   Monocytes Relative 12  3 - 12 %   Monocytes Absolute 1.0  0.1 - 1.0 K/uL   Eosinophils Relative 2  0 - 5 %   Eosinophils Absolute 0.2  0.0 - 0.7 K/uL   Basophils Relative 1  0 - 1 %   Basophils Absolute 0.1  0.0 - 0.1 K/uL  COMPREHENSIVE METABOLIC PANEL     Status: Abnormal   Collection Time    12/03/13  4:25 AM      Result Value Ref Range   Sodium 139  137 - 147 mEq/L   Potassium 3.9  3.7 - 5.3 mEq/L   Chloride 102  96 - 112 mEq/L   CO2 25  19 - 32 mEq/L   Glucose, Bld 115 (*) 70 - 99 mg/dL    BUN 17  6 - 23 mg/dL   Creatinine,  Ser 0.90  0.50 - 1.35 mg/dL   Calcium 8.8  8.4 - 10.5 mg/dL   Total Protein 6.9  6.0 - 8.3 g/dL   Albumin 3.2 (*) 3.5 - 5.2 g/dL   AST 18  0 - 37 U/L   ALT 15  0 - 53 U/L   Alkaline Phosphatase 72  39 - 117 U/L   Total Bilirubin <0.2 (*) 0.3 - 1.2 mg/dL   GFR calc non Af Amer >90  >90 mL/min   GFR calc Af Amer >90  >90 mL/min  LIPASE, BLOOD     Status: Abnormal   Collection Time    12/03/13  4:25 AM      Result Value Ref Range   Lipase 190 (*) 11 - 59 U/L   CT Abdomen Pelvis W Contrast (Final result)  Result time: 12/03/13 08:04:49    Final result by Rad Results In Interface (12/03/13 08:04:49)    Narrative:   CLINICAL DATA: 38 year old with diarrhea and history of Crohn's disease.  EXAM: CT ABDOMEN AND PELVIS WITH CONTRAST  TECHNIQUE: Multidetector CT imaging of the abdomen and pelvis was performed using the standard protocol following bolus administration of intravenous contrast.  CONTRAST: 28m OMNIPAQUE IOHEXOL 300 MG/ML SOLN, 1054mOMNIPAQUE IOHEXOL 300 MG/ML SOLN  COMPARISON: CT 09/18/2013  FINDINGS: Lung bases are clear. No evidence for free intraperitoneal air.  Slightly decreased attenuation of the liver is similar to the previous examination. There is no focal liver abnormality. Normal appearance of the portal venous system and gallbladder. Normal appearance of the pancreas, spleen stomach, adrenal glands and kidneys. There is a stable exophytic low-density structure involving the lateral left kidney that measures 1.2 cm and suggestive for a cortical cyst.  Small lymph nodes in the upper abdomen are stable. There is no significant abdominal or pelvic lymphadenopathy. The previous examination described enlarged lymph nodes in the iliac chains. These lymph nodes are less conspicuous on today's examination. Index lesion along the right iliac chain on the sequence 2, image 64, measures 0.8 cm in the short axis and  previously measured 1.1 cm. Few calcifications in the prostate gland. There is fluid in the urinary bladder.  Again noted are small ventral hernias containing fat. There is no evidence for bowel involvement in the ventral hernias. Postsurgical changes along the anterior abdominal wall. Patient had previous bowel resection and most likely a right hemicolectomy. Anastomotic bowel clips are identified in the right abdomen. There is a prominent lymph node in right mesentery measuring up to 1.2 cm on sequence 2, image 44. This lymph node is unchanged since 03/15/2009. There is mild dilatation of the proximal jejunal loops which contain contrast. There does not appear to be an obstructive process. There is minimal stranding within the jejunal mesentery which is nonspecific. The seminal vesicles are less prominent than the previous examination.  No acute bone abnormality. Mild disc space narrowing at L5-S1.  IMPRESSION: Stable postsurgical changes in the abdomen. There is no evidence for bowel obstruction or bowel wall thickening. Question mild mesenteric stranding in the jejunal mesentery which is nonspecific.  The inflammatory changes in the pelvis have resolved since the previous examination.  Ventral hernias without bowel involvement.   Electronically Signed By: AdMarkus Daft.D. On: 12/03/2013 08:04           MDM   Ddx: infectious colitis, Crohn's flare, pancreatitis, gallbladder disease, enteritis  We are managing symptomatically with IVF, antiemetic and analgesia. The patient has given a stool sample and will send  for C.Diff tox assay. In light of the patient's history of Crohn's, we will CT the abd/pelvis to help differentiate this process from diffuse infectious colitis.     Elyn Peers, MD 12/03/13 [phone]  Results of CT scan and labs discussed with patient. CT scan suggests that pain is secondary to a localized inflammatory process - in this case, Crohn's flare. We  will manage symptomatically and patient will fill his previously prescribed scripts for Flagyl and Prednisone. He will f/u with his PCP this week. Return precautions reviewed.   Elyn Peers, MD 12/03/13 [phone]

## 2013-12-03 NOTE — ED Notes (Signed)
Pt arrived to the ED with a complaint of abdominal pain and associated diarrhea. Pt has Crohn disease.  Pt went to PCP and was told he possibly had c-diff.  Pt told to take zofran and prednisone.  Pt has had no relief.

## 2013-12-03 NOTE — Discharge Instructions (Signed)
Abdominal Pain, Adult °Many things can cause abdominal pain. Usually, abdominal pain is not caused by a disease and will improve without treatment. It can often be observed and treated at home. Your health care provider will do a physical exam and possibly order blood tests and X-rays to help determine the seriousness of your pain. However, in many cases, more time must pass before a clear cause of the pain can be found. Before that point, your health care provider may not know if you need more testing or further treatment. °HOME CARE INSTRUCTIONS  °Monitor your abdominal pain for any changes. The following actions may help to alleviate any discomfort you are experiencing: °· Only take over-the-counter or prescription medicines as directed by your health care provider. °· Do not take laxatives unless directed to do so by your health care provider. °· Try a clear liquid diet (broth, tea, or water) as directed by your health care provider. Slowly move to a bland diet as tolerated. °SEEK MEDICAL CARE IF: °· You have unexplained abdominal pain. °· You have abdominal pain associated with nausea or diarrhea. °· You have pain when you urinate or have a bowel movement. °· You experience abdominal pain that wakes you in the night. °· You have abdominal pain that is worsened or improved by eating food. °· You have abdominal pain that is worsened with eating fatty foods. °SEEK IMMEDIATE MEDICAL CARE IF:  °· Your pain does not go away within 2 hours. °· You have a fever. °· You keep throwing up (vomiting). °· Your pain is felt only in portions of the abdomen, such as the right side or the left lower portion of the abdomen. °· You pass bloody or black tarry stools. °MAKE SURE YOU: °· Understand these instructions.   °· Will watch your condition.   °· Will get help right away if you are not doing well or get worse.   °Document Released: 03/18/2005 Document Revised: 03/29/2013 Document Reviewed: 02/15/2013 °[employer]® Patient  Information ©2014 [employer]. ° °

## 2013-12-04 DIAGNOSIS — R1011 Right upper quadrant pain: Secondary | ICD-10-CM | POA: Insufficient documentation

## 2013-12-04 NOTE — Assessment & Plan Note (Signed)
Hx of crohns, cant r/o, declines ct due to cost, for predpac asd

## 2013-12-04 NOTE — Assessment & Plan Note (Addendum)
Etiology unclear, cant r/o c diff, for stool studies, hiv, cbc, empiric flagyl,  to f/u any worsening symptoms or concerns

## 2013-12-04 NOTE — Assessment & Plan Note (Signed)
Also for antiemetic prn

## 2013-12-25 ENCOUNTER — Telehealth: Payer: Self-pay

## 2013-12-25 ENCOUNTER — Encounter: Payer: Self-pay | Admitting: Internal Medicine

## 2013-12-25 NOTE — Telephone Encounter (Signed)
Spoke with pt and he is aware. Pt scheduled to see Dr. Carlean Purl 12/27/13@3pm . Pt aware of appt.

## 2013-12-25 NOTE — Telephone Encounter (Signed)
Message copied by Algernon Huxley on Mon Dec 25, 2013  4:46 PM ------      Message from: Silvano Rusk E      Created: Mon Dec 25, 2013  4:39 PM       Let me see him at 3 PM wed - talk to [place]      ----- Message -----         From: Maury Dus, RN         Sent: 12/25/2013   4:12 PM           To: Gatha Mayer, MD            Unless we have a cancellation there are no appts available until next week.            ----- Message -----         From: Gatha Mayer, MD         Sent: 12/25/2013   3:58 PM           To: Maury Dus, RN            It does not sound like he needs the ED      Can he see an APP in next 1-2 days?      ----- Message -----         From: Maury Dus, RN         Sent: 12/25/2013   2:19 PM           To: Gatha Mayer, MD            Dr. Carlean Purl,            I spoke with [name] and he states he feels like has had a fever and it comes and goes, does not have a thermometer. Also states he has been having bouts of nausea that come and go, with vomiting about 1-2 times/day. Pt states his pain is located in his rectal/bottom area. Pt wanted to know if he should go to the hospital or not.            Thanks,      [name]                   ------

## 2013-12-27 ENCOUNTER — Encounter: Payer: Self-pay | Admitting: Internal Medicine

## 2013-12-27 ENCOUNTER — Ambulatory Visit (INDEPENDENT_AMBULATORY_CARE_PROVIDER_SITE_OTHER): Payer: Self-pay | Admitting: Internal Medicine

## 2013-12-27 VITALS — BP 140/70 | HR 60 | Ht 69.0 in | Wt 219.0 lb

## 2013-12-27 DIAGNOSIS — K50813 Crohn's disease of both small and large intestine with fistula: Secondary | ICD-10-CM

## 2013-12-27 DIAGNOSIS — K5080 Crohn's disease of both small and large intestine without complications: Secondary | ICD-10-CM

## 2013-12-27 MED ORDER — DIPHENOXYLATE-ATROPINE 2.5-0.025 MG PO TABS: 1.0000 | ORAL_TABLET | Freq: Four times a day (QID) | ORAL | Status: DC | PRN

## 2013-12-27 MED ORDER — PREDNISONE 10 MG PO TABS: 40.0000 mg | ORAL_TABLET | Freq: Every day | ORAL | Status: DC

## 2013-12-27 MED ORDER — OXYCODONE-ACETAMINOPHEN 10-325 MG PO TABS: 1.0000 | ORAL_TABLET | ORAL | Status: DC | PRN

## 2013-12-27 MED ORDER — CIPROFLOXACIN HCL 500 MG PO TABS: 500.0000 mg | ORAL_TABLET | Freq: Two times a day (BID) | ORAL | Status: DC

## 2013-12-27 MED ORDER — METRONIDAZOLE 500 MG PO TABS: 500.0000 mg | ORAL_TABLET | Freq: Two times a day (BID) | ORAL | Status: DC

## 2013-12-27 NOTE — Patient Instructions (Addendum)
Today you have been given information on Sitz baths to use.  We have sent the following medications to your pharmacy for you to pick up at your convenience: Flagyl (to reduce your chances of getting C-Diff), Prednisone, Cipro  We are giving you a printed rx for Percocet and Lomotil to take to the pharmacy.  Keep [place] up to date on your status via [place].  Follow up with [place] in 6 weeks, appointment made for 02/05/14 at 2:15pm.    I appreciate the opportunity to care for you.

## 2013-12-27 NOTE — Assessment & Plan Note (Addendum)
Off Tx Flaring w/ fistula +/- fissure, abscess? 1) Prednisone 40 mg x 1 week then 30 mg and then notify me 2) Cipro 500 mg bid for suspected abscess x 10 d 3) Metronidazole 500 mg bid also for that and to try to prevent C diff x 10 d 4) Recticare prn 5) Warm sitz baths 6)  Percocet # 30 7) ? Compassionate use Humira or Cimzia 8) Lomotil

## 2013-12-27 NOTE — Progress Notes (Signed)
   Subjective:    Patient ID: [name] [name], male    DOB: 02/10/1976, 38 y.o.   MRN: [id number]  HPI [name] [name] is here after calling with c/o increasing rectal pain and mucoid dc No clear fever Diarrhea has been a problem x months - 9+ stools/24 hr period. He is off Crohn's Tx as uninsured and cannot afford meds. Has had short courses of metronidazole through Dr. Jenny Reichmann. ? Benefit. In past week the rectal pain and dc started - similar to prior issues with fistula.  He is back at [facility] and hopeful for health insurance by end of 2015.  Medications, allergies, past medical history, past surgical history, family history and social history are reviewed and updated in the EMR.  Review of Systems + male HIV + intercourse partner. Using condoms. Rare anal receptive intercourse but not in a while.    Objective:   Physical Exam NAD Abd obese, soft and NT Rectal exam - posterior tag/pile with indurated and tender area Left lateral no mass, tender anal sphincter, no dc     Assessment & Plan:  CROHN'S DISEASE, LARGE AND SMALL INTESTINES Off Tx Flaring w/ fistula +/- fissure, abscess? 1) Prednisone 40 mg x 1 week then 30 mg and then notify me 2) Cipro 500 mg bid for suspected abscess x 10 d 3) Metronidazole 500 mg bid also for that and to try to prevent C diff x 10 d 4) Recticare prn 5) Warm sitz baths 6)  Percocet # 30 7) ? Compassionate use Humira or Cimzia 8) Lomotil

## 2013-12-28 ENCOUNTER — Telehealth: Payer: Self-pay

## 2013-12-28 NOTE — Telephone Encounter (Signed)
Message copied by Marlon Pel on Thu Dec 28, 2013  3:34 PM ------      Message from: Silvano Rusk E      Created: Wed Dec 27, 2013  9:08 PM      Regarding: ? Humira free       Please send him any forms that would allow this            Could also see what Cimzia has ------

## 2013-12-28 NOTE — Telephone Encounter (Signed)
I have left a message for [name] [name] to fill out the application that I have mailed him and bring it back with copies of pay stubs or W2 forms and we will send in the information to [facility] program.  He is asked to call for any questions.

## 2014-01-12 ENCOUNTER — Other Ambulatory Visit: Payer: Self-pay | Admitting: Internal Medicine

## 2014-01-12 ENCOUNTER — Encounter: Payer: Self-pay | Admitting: Internal Medicine

## 2014-01-12 DIAGNOSIS — K50813 Crohn's disease of both small and large intestine with fistula: Secondary | ICD-10-CM

## 2014-01-12 MED ORDER — CIPROFLOXACIN HCL 500 MG PO TABS: 500.0000 mg | ORAL_TABLET | Freq: Two times a day (BID) | ORAL | Status: DC

## 2014-01-12 MED ORDER — DIPHENOXYLATE-ATROPINE 2.5-0.025 MG PO TABS: 1.0000 | ORAL_TABLET | Freq: Four times a day (QID) | ORAL | Status: DC | PRN

## 2014-01-12 MED ORDER — OXYCODONE-ACETAMINOPHEN 10-325 MG PO TABS: 1.0000 | ORAL_TABLET | ORAL | Status: DC | PRN

## 2014-01-12 MED ORDER — METRONIDAZOLE 500 MG PO TABS: 500.0000 mg | ORAL_TABLET | Freq: Two times a day (BID) | ORAL | Status: DC

## 2014-01-12 NOTE — Telephone Encounter (Signed)
LM on patient's VM that rx's are ready.

## 2014-01-18 ENCOUNTER — Telehealth: Payer: Self-pay | Admitting: Internal Medicine

## 2014-01-18 NOTE — Telephone Encounter (Signed)
I have left a message that have not heard anything from Humira assistance.  He should receive a letter from them

## 2014-01-19 ENCOUNTER — Other Ambulatory Visit: Payer: Self-pay | Admitting: Internal Medicine

## 2014-01-19 MED ORDER — DILTIAZEM GEL 2 %: 1.0000 "application " | Freq: Two times a day (BID) | CUTANEOUS | Status: DC

## 2014-01-29 ENCOUNTER — Other Ambulatory Visit: Payer: Self-pay | Admitting: Internal Medicine

## 2014-01-29 MED ORDER — OXYCODONE-ACETAMINOPHEN 10-325 MG PO TABS: 1.0000 | ORAL_TABLET | ORAL | Status: DC | PRN

## 2014-02-02 ENCOUNTER — Telehealth: Payer: Self-pay

## 2014-02-02 NOTE — Telephone Encounter (Signed)
Humira foundation assistance has been approved. He should receive his shipment of Humira next Thursday.  I have left a message for Bradden to call back to discuss.  Does he have teaching set up with the ambassador?

## 2014-02-02 NOTE — Telephone Encounter (Signed)
Patient will contact his ambassador for teaching when he gets his shipment next week,.

## 2014-02-05 ENCOUNTER — Ambulatory Visit: Payer: Self-pay | Admitting: Internal Medicine

## 2014-02-07 ENCOUNTER — Other Ambulatory Visit: Payer: Self-pay | Admitting: Internal Medicine

## 2014-02-08 NOTE — Telephone Encounter (Signed)
May I refill Sir, thank you.

## 2014-02-08 NOTE — Telephone Encounter (Signed)
Refill # 60 2 refills

## 2014-02-12 ENCOUNTER — Encounter: Payer: Self-pay | Admitting: Internal Medicine

## 2014-02-12 ENCOUNTER — Other Ambulatory Visit: Payer: Self-pay | Admitting: Gastroenterology

## 2014-02-13 MED ORDER — OXYCODONE-ACETAMINOPHEN 10-325 MG PO TABS: 1.0000 | ORAL_TABLET | ORAL | Status: DC | PRN

## 2014-02-15 ENCOUNTER — Ambulatory Visit (INDEPENDENT_AMBULATORY_CARE_PROVIDER_SITE_OTHER): Payer: Self-pay | Admitting: Internal Medicine

## 2014-02-15 ENCOUNTER — Encounter: Payer: Self-pay | Admitting: Internal Medicine

## 2014-02-15 VITALS — BP 172/98 | HR 88 | Ht 69.0 in | Wt 230.6 lb

## 2014-02-15 DIAGNOSIS — K5080 Crohn's disease of both small and large intestine without complications: Secondary | ICD-10-CM

## 2014-02-15 DIAGNOSIS — K601 Chronic anal fissure: Secondary | ICD-10-CM | POA: Insufficient documentation

## 2014-02-15 DIAGNOSIS — K602 Anal fissure, unspecified: Secondary | ICD-10-CM

## 2014-02-15 DIAGNOSIS — I10 Essential (primary) hypertension: Secondary | ICD-10-CM

## 2014-02-15 DIAGNOSIS — K50818 Crohn's disease of both small and large intestine with other complication: Secondary | ICD-10-CM

## 2014-02-15 MED ORDER — DIPHENOXYLATE-ATROPINE 2.5-0.025 MG PO TABS: 1.0000 | ORAL_TABLET | Freq: Four times a day (QID) | ORAL | Status: DC | PRN

## 2014-02-15 NOTE — Progress Notes (Signed)
   Subjective:    Patient ID: [name] [name], male    DOB: 11/18/1975, 38 y.o.   MRN: [id number]  HPI [name] [name] is here for followup of his Crohn's disease. He is still suffering with diarrhea and rectal pain. There is a small ooze or discharge in the anal and rectal area at times. He was using some Lomotil. He was using diltiazem gel but that didn't seem to help after a couple of weeks so he stopped. He is diagnosed with an anal fissure. He does not think antibiotic therapy helped. He has had one injection, his initial 160 mg dose of Humira.  Medications, allergies, past medical history, past surgical history, family history and social history are reviewed and updated in the EMR.  Review of Systems As above    Objective:   Physical Exam General:  NAD Abdomen:  Obese, soft and mildly tender RLQ, BS+ Rectal : Posterior sentinel pile tender, a few white tiny nodules ? Condyloma no signs of fistula, abscess    Data Reviewed:  Labs previous imaging        Assessment & Plan:  Anal fissure - posterior He needs to resume diltiazem gel explained that even if it doesn't seem like it's helping over time it should. Control diarrhea also. The other day before he came in a refill of his oxycodone acetaminophen. Can do that again, but hopefully there will be a decline in need for this.  CROHN'S DISEASE, LARGE AND SMALL INTESTINES First dose Humira done Likely take some months for this tic he can. I wonder if he might have some component of bile salt induced diarrhea as well given his right hemicolectomy but I don't think he can afford a bile salt binder at this time. We'll use Lomotil and continue prednisone to try to taper this soon, see me as planned in mid October.   Essential hypertension, benign Restart medications and blood pressure check next week

## 2014-02-15 NOTE — Assessment & Plan Note (Addendum)
He needs to resume diltiazem gel explained that even if it doesn't seem like it's helping over time it should. Control diarrhea also. The other day before he came in a refill of his oxycodone acetaminophen. Can do that again, but hopefully there will be a decline in need for this.

## 2014-02-15 NOTE — Assessment & Plan Note (Addendum)
First dose Humira done Likely take some months for this tic he can. I wonder if he might have some component of bile salt induced diarrhea as well given his right hemicolectomy but I don't think he can afford a bile salt binder at this time. We'll use Lomotil and continue prednisone to try to taper this soon, see me as planned in mid October.

## 2014-02-15 NOTE — Patient Instructions (Addendum)
We have sent the following medications to your pharmacy for you to pick up at your convenience: Lomotil . Take one before each meal on a regular basis, you may take two at a time if needed.  We faxed the rx to [facility] pharmacy.  Restart your Diltiazem gel per Dr. Carlean Purl.  Keep your October appointment with [place].   I appreciate the opportunity to care for you.

## 2014-02-16 ENCOUNTER — Encounter: Payer: Self-pay | Admitting: Internal Medicine

## 2014-02-16 NOTE — Assessment & Plan Note (Signed)
Restart medications and blood pressure check next week

## 2014-02-22 ENCOUNTER — Encounter: Payer: Self-pay | Admitting: Internal Medicine

## 2014-02-22 ENCOUNTER — Ambulatory Visit: Payer: Self-pay | Admitting: Internal Medicine

## 2014-02-22 ENCOUNTER — Other Ambulatory Visit: Payer: Self-pay | Admitting: Internal Medicine

## 2014-02-22 VITALS — BP 130/86

## 2014-02-22 DIAGNOSIS — I10 Essential (primary) hypertension: Secondary | ICD-10-CM

## 2014-02-22 MED ORDER — OXYCODONE-ACETAMINOPHEN 10-325 MG PO TABS: 1.0000 | ORAL_TABLET | ORAL | Status: DC | PRN

## 2014-02-22 NOTE — Telephone Encounter (Signed)
Patient informed via My Chart by Dr.Gessner that he can pick this up tomorrow.

## 2014-03-15 ENCOUNTER — Other Ambulatory Visit: Payer: Self-pay | Admitting: Internal Medicine

## 2014-03-16 ENCOUNTER — Other Ambulatory Visit: Payer: Self-pay

## 2014-03-16 DIAGNOSIS — K50818 Crohn's disease of both small and large intestine with other complication: Secondary | ICD-10-CM

## 2014-03-16 MED ORDER — DIPHENOXYLATE-ATROPINE 2.5-0.025 MG PO TABS: 1.0000 | ORAL_TABLET | Freq: Four times a day (QID) | ORAL | Status: DC | PRN

## 2014-03-16 MED ORDER — OXYCODONE-ACETAMINOPHEN 10-325 MG PO TABS: 1.0000 | ORAL_TABLET | ORAL | Status: DC | PRN

## 2014-03-20 ENCOUNTER — Ambulatory Visit: Payer: Self-pay | Admitting: Internal Medicine

## 2014-03-20 DIAGNOSIS — Z0289 Encounter for other administrative examinations: Secondary | ICD-10-CM

## 2014-03-29 ENCOUNTER — Emergency Department (HOSPITAL_COMMUNITY): Payer: No Typology Code available for payment source

## 2014-03-29 ENCOUNTER — Encounter (HOSPITAL_COMMUNITY): Payer: Self-pay | Admitting: Emergency Medicine

## 2014-03-29 DIAGNOSIS — I10 Essential (primary) hypertension: Secondary | ICD-10-CM | POA: Insufficient documentation

## 2014-03-29 DIAGNOSIS — R0789 Other chest pain: Secondary | ICD-10-CM | POA: Insufficient documentation

## 2014-03-29 DIAGNOSIS — Z87448 Personal history of other diseases of urinary system: Secondary | ICD-10-CM | POA: Insufficient documentation

## 2014-03-29 DIAGNOSIS — R071 Chest pain on breathing: Secondary | ICD-10-CM | POA: Insufficient documentation

## 2014-03-29 DIAGNOSIS — Z87442 Personal history of urinary calculi: Secondary | ICD-10-CM | POA: Insufficient documentation

## 2014-03-29 DIAGNOSIS — Z79899 Other long term (current) drug therapy: Secondary | ICD-10-CM | POA: Insufficient documentation

## 2014-03-29 DIAGNOSIS — Z8619 Personal history of other infectious and parasitic diseases: Secondary | ICD-10-CM | POA: Insufficient documentation

## 2014-03-29 DIAGNOSIS — Z8669 Personal history of other diseases of the nervous system and sense organs: Secondary | ICD-10-CM | POA: Insufficient documentation

## 2014-03-29 DIAGNOSIS — F329 Major depressive disorder, single episode, unspecified: Secondary | ICD-10-CM | POA: Insufficient documentation

## 2014-03-29 DIAGNOSIS — E669 Obesity, unspecified: Secondary | ICD-10-CM | POA: Insufficient documentation

## 2014-03-29 DIAGNOSIS — Z8709 Personal history of other diseases of the respiratory system: Secondary | ICD-10-CM | POA: Insufficient documentation

## 2014-03-29 DIAGNOSIS — Z8719 Personal history of other diseases of the digestive system: Secondary | ICD-10-CM | POA: Insufficient documentation

## 2014-03-29 DIAGNOSIS — Z7952 Long term (current) use of systemic steroids: Secondary | ICD-10-CM | POA: Insufficient documentation

## 2014-03-29 DIAGNOSIS — Z8739 Personal history of other diseases of the musculoskeletal system and connective tissue: Secondary | ICD-10-CM | POA: Insufficient documentation

## 2014-03-29 LAB — BASIC METABOLIC PANEL
ANION GAP: 13 (ref 5–15)
BUN: 16 mg/dL (ref 6–23)
CO2: 27 meq/L (ref 19–32)
Calcium: 8.9 mg/dL (ref 8.4–10.5)
Chloride: 98 mEq/L (ref 96–112)
Creatinine, Ser: 1 mg/dL (ref 0.50–1.35)
GFR calc Af Amer: >90 mL/min (ref >90)
GFR calc non Af Amer: >90 mL/min (ref >90)
Glucose, Bld: 127 mg/dL — ABNORMAL HIGH (ref 70–99)
Potassium: 3.4 mEq/L — ABNORMAL LOW (ref 3.7–5.3)
SODIUM: 138 meq/L (ref 137–147)

## 2014-03-29 LAB — CBC
HCT: 37.5 % — ABNORMAL LOW (ref 39.0–52.0)
Hemoglobin: 12.7 g/dL — ABNORMAL LOW (ref 13.0–17.0)
MCH: 30.5 pg (ref 26.0–34.0)
MCHC: 33.9 g/dL (ref 30.0–36.0)
MCV: 90.1 fL (ref 78.0–100.0)
Platelets: 212 10*3/uL (ref 150–400)
RBC: 4.16 MIL/uL — AB (ref 4.22–5.81)
RDW: 14.9 % (ref 11.5–15.5)
WBC: 5.5 10*3/uL (ref 4.0–10.5)

## 2014-03-29 LAB — I-STAT TROPONIN, ED: TROPONIN I, POC: 0 ng/mL (ref 0.00–0.08)

## 2014-03-29 NOTE — ED Notes (Signed)
Pt. reports intermittent mid / right chest pain onset this evening with mild nausea , pain worse with deep inspiration / movement and certain positions . Pt. stated pain started when he tried to reach for milk jug this morning .

## 2014-03-30 ENCOUNTER — Emergency Department (HOSPITAL_COMMUNITY)
Admission: EM | Admit: 2014-03-30 | Discharge: 2014-03-30 | Disposition: A | Discharge status: Home / Self Care | Payer: No Typology Code available for payment source | Attending: Emergency Medicine | Admitting: Emergency Medicine

## 2014-03-30 DIAGNOSIS — R071 Chest pain on breathing: Secondary | ICD-10-CM

## 2014-03-30 DIAGNOSIS — R0789 Other chest pain: Secondary | ICD-10-CM

## 2014-03-30 LAB — I-STAT TROPONIN, ED: Troponin i, poc: 0 ng/mL (ref 0.00–0.08)

## 2014-03-30 MED ORDER — OXYCODONE-ACETAMINOPHEN 10-325 MG PO TABS: 1.0000 | ORAL_TABLET | Freq: Four times a day (QID) | ORAL | Status: DC | PRN

## 2014-03-30 MED ORDER — METHOCARBAMOL 750 MG PO TABS: 750.0000 mg | ORAL_TABLET | Freq: Four times a day (QID) | ORAL | Status: DC

## 2014-03-30 MED ORDER — NAPROXEN 500 MG PO TABS: 500.0000 mg | ORAL_TABLET | Freq: Two times a day (BID) | ORAL | Status: DC

## 2014-03-30 MED ORDER — OXYCODONE-ACETAMINOPHEN 5-325 MG PO TABS
2.0000 | ORAL_TABLET | Freq: Once | ORAL | Status: AC
  Administered 2014-03-30: 2 via ORAL
  Filled 2014-03-30: qty 2

## 2014-03-30 MED ORDER — METHOCARBAMOL 500 MG PO TABS
1000.0000 mg | ORAL_TABLET | Freq: Once | ORAL | Status: AC
  Administered 2014-03-30: 1000 mg via ORAL
  Filled 2014-03-30: qty 2

## 2014-03-30 NOTE — ED Provider Notes (Signed)
CSN: [id number]     Arrival date & time 03/29/14  2230 History   First MD Initiated Contact with Patient 03/30/14 0036     Chief Complaint  Patient presents with  . Chest Pain     (Consider location/radiation/quality/duration/timing/severity/associated sxs/prior Treatment) HPI 38 yo male presents to the ER from home with complaint of chest pain.  Pain started Thursday morning after lifting a milk jug and has been constant since onset.  Pain worsens with deep breaths, movement of chest wall and palpation.  Pt with h/o crohns, on prednisone.  No h/o HTN, elevaed cholesterol or family history of CAD.  No leg swelling or pain, no prolonged immobilization or recent surgeries. No n/v/d, no diaphoresis. Past Medical History  Diagnosis Date  . Obstructive sleep apnea (adult) (pediatric)   . Anxiety and depression   . Essential hypertension, benign   . Obesity, unspecified   . Herpes zoster infection 07/2010, 12/2010    with post-herpetic neuralgia  . Vitamin D deficiency   . Crohn's disease   . Hyperlipidemia   . CMV (cytomegalovirus infection) 10/28/2010  . Nephrolithiasis   . Fistula, perirectal   . Sinus infection 08/05/11  . Depression   . Syncope   . Pyelonephritis 2015  . Plantar fasciitis of right foot 08/22/2013  . Tinea pedis 06/27/2013  . Anal fissure - posterior 02/15/2014   Past Surgical History  Procedure Laterality Date  . Small intestine surgery  08/05/07    1 ft removed, ileo-cecectomy  . Hypospadias correction      Childhood   . Upper gastrointestinal endoscopy  10/15/2010    w/biopsy, mild gastritis and duodenitis  . Colonoscopy  10/14/2007    crohn's colitis, aphthous ulcers, mild anal stenosis  . Colon resection    . Tooth extraction     Family History  Problem Relation Age of Onset  . Heart disease Father     CAD/MI  . Hyperlipidemia Father   . Hypertension Father   . Gout Father   . Sleep apnea Father   . Colon cancer Neg Hx   . Diabetes Neg Hx   . COPD Neg  Hx   . Obesity Mother   . Allergies Mother   . Cancer Mother   . Stroke Maternal Grandfather   . Allergies Sister   . Asthma Sister    History  Substance Use Topics  . Smoking status: Never Smoker   . Smokeless tobacco: Never Used  . Alcohol Use: 1.8 oz/week    3 Shots of liquor per week     Comment: 3/week    Review of Systems  All other systems reviewed and are negative.     Allergies  Review of patient's allergies indicates no known allergies.  Home Medications   Prior to Admission medications   Medication Sig Start Date End Date Taking? Authorizing Provider  alprazolam Duanne Moron) 2 MG tablet TAKE HALF TABLET BY MOUTH TWICE DAILY AS NEEDED for anxiety 02/08/14   Gatha Mayer, MD  diltiazem 2 % GEL Apply 1 application topically 2 (two) times daily. Into rectum - pea-sized amount 01/19/14   Gatha Mayer, MD  diphenoxylate-atropine (LOMOTIL) 2.5-0.025 MG per tablet Take 1 tablet by mouth 4 (four) times daily as needed for diarrhea or loose stools. 03/16/14   Gatha Mayer, MD  lisinopril-hydrochlorothiazide (PRINZIDE,ZESTORETIC) 20-25 MG per tablet Take 1 tablet by mouth daily. 11/30/13   Biagio Borg, MD  mirtazapine (REMERON) 15 MG tablet Take 1 tablet (15  mg total) by mouth at bedtime. 11/30/13   Biagio Borg, MD  oxyCODONE-acetaminophen (PERCOCET) 10-325 MG per tablet Take 1 tablet by mouth every 4 (four) hours as needed for pain. 03/16/14   Gatha Mayer, MD  PARoxetine (PAXIL) 20 MG tablet Take 2 tablets (40 mg total) by mouth daily. 11/30/13   Biagio Borg, MD  predniSONE (DELTASONE) 10 MG tablet Take 4 tablets (40 mg total) by mouth daily with breakfast. In 1 week reduce to 30 mg daily 12/27/13   Gatha Mayer, MD  promethazine (PHENERGAN) 12.5 MG tablet Take 1 tablet (12.5 mg total) by mouth every 8 (eight) hours as needed for nausea or vomiting. 11/30/13   Biagio Borg, MD   BP 126/71  Pulse 72  Temp(Src) 98.2 F (36.8 C) (Oral)  Resp 16  SpO2 96% Physical Exam   Nursing note and vitals reviewed. Constitutional: He is oriented to person, place, and time. He appears well-developed and well-nourished. He appears distressed.  HENT:  Head: Normocephalic and atraumatic.  Nose: Nose normal.  Mouth/Throat: Oropharynx is clear and moist.  Eyes: Conjunctivae and EOM are normal. Pupils are equal, round, and reactive to light.  Neck: Normal range of motion. Neck supple. No JVD present. No tracheal deviation present. No thyromegaly present.  Cardiovascular: Normal rate, regular rhythm, normal heart sounds and intact distal pulses.  Exam reveals no gallop and no friction rub.   No murmur heard. Pulmonary/Chest: Effort normal and breath sounds normal. No stridor. No respiratory distress. He has no wheezes. He has no rales. He exhibits tenderness (TTP over right sternum).  Abdominal: Soft. Bowel sounds are normal. He exhibits no distension and no mass. There is no tenderness. There is no rebound and no guarding.  Musculoskeletal: Normal range of motion. He exhibits no edema and no tenderness.  Lymphadenopathy:    He has no cervical adenopathy.  Neurological: He is alert and oriented to person, place, and time. He displays normal reflexes. He exhibits normal muscle tone. Coordination normal.  Skin: Skin is warm and dry. No rash noted. No erythema. No pallor.  Psychiatric: He has a normal mood and affect. His behavior is normal. Judgment and thought content normal.    ED Course  Procedures (including critical care time) Labs Review Labs Reviewed  CBC - Abnormal; Notable for the following:    RBC 4.16 (*)    Hemoglobin 12.7 (*)    HCT 37.5 (*)    All other components within normal limits  BASIC METABOLIC PANEL - Abnormal; Notable for the following:    Potassium 3.4 (*)    Glucose, Bld 127 (*)    All other components within normal limits  I-STAT TROPOININ, ED  I-STAT TROPOININ, ED    Imaging Review Dg Chest 2 View  03/29/2014   CLINICAL DATA:  Acute  onset of pleuritic right-sided chest pain and shortness of breath. Initial encounter.  EXAM: CHEST  2 VIEW  COMPARISON:  Chest radiograph and CTA of the chest performed 10/26/2013  FINDINGS: The lungs are well-aerated. Mild vascular congestion is noted. There is no evidence of focal opacification, pleural effusion or pneumothorax.  The heart is normal in size; the mediastinal contour is within normal limits. No acute osseous abnormalities are seen.  IMPRESSION: Mild vascular congestion noted; lungs remain grossly clear.   Electronically Signed   By: Garald Balding M.D.   On: 03/29/2014 23:47     EKG Interpretation   Date/Time:  Thursday March 29 2014 22:37:29  EDT Ventricular Rate:  77 PR Interval:  120 QRS Duration: 86 QT Interval:  364 QTC Calculation: 411 R Axis:   73 Text Interpretation:  Normal sinus rhythm Nonspecific ST abnormality  Abnormal ECG Baseline wander Poor data quality Confirmed by Danyel Tobey  MD,  Natasha Paulson ([id number]) on 03/30/2014 12:38:00 AM       EKG Interpretation  Date/Time:  Thursday March 29 2014 22:45:25 EDT Ventricular Rate:  69 PR Interval:  120 QRS Duration: 88 QT Interval:  368 QTC Calculation: 394 R Axis:   77 Text Interpretation:  Normal sinus rhythm Normal ECG No significant change since 04/06/13 Confirmed by Gala Padovano  MD, Jere Vanburen ([id number]) on 03/30/2014 12:38:27 AM        MDM   Final diagnoses:  Costochondral chest pain  Chest wall pain    38 yo male with right parasternal chest pain, constant since onset Thursday morning when lifting up milk jug.  Pain with movement, palpation.  W/u unremarkable, no cardiac risk factors.  Will d/c home with pain medication    Kalman Drape, MD 03/30/14 1946

## 2014-03-30 NOTE — Discharge Instructions (Signed)
Chest Wall Pain Chest wall pain is pain in or around the bones and muscles of your chest. It may take up to 6 weeks to get better. It may take longer if you must stay physically active in your work and activities.  CAUSES  Chest wall pain may happen on its own. However, it may be caused by:  A viral illness like the flu.  Injury.  Coughing.  Exercise.  Arthritis.  Fibromyalgia.  Shingles. HOME CARE INSTRUCTIONS   Avoid overtiring physical activity. Try not to strain or perform activities that cause pain. This includes any activities using your chest or your abdominal and side muscles, especially if heavy weights are used.  Put ice on the sore area.  Put ice in a plastic bag.  Place a towel between your skin and the bag.  Leave the ice on for 15-20 minutes per hour while awake for the first 2 days.  Only take over-the-counter or prescription medicines for pain, discomfort, or fever as directed by your caregiver. SEEK IMMEDIATE MEDICAL CARE IF:   Your pain increases, or you are very uncomfortable.  You have a fever.  Your chest pain becomes worse.  You have new, unexplained symptoms.  You have nausea or vomiting.  You feel sweaty or lightheaded.  You have a cough with phlegm (sputum), or you cough up blood. MAKE SURE YOU:   Understand these instructions.  Will watch your condition.  Will get help right away if you are not doing well or get worse. Document Released: 06/08/2005 Document Revised: 08/31/2011 Document Reviewed: 02/02/2011 [facility] Patient Information 2015 [place], [place]. This information is not intended to replace advice given to you by your health care provider. Make sure you discuss any questions you have with your health care provider.  Costochondritis Costochondritis, sometimes called Tietze syndrome, is a swelling and irritation (inflammation) of the tissue (cartilage) that connects your ribs with your breastbone (sternum). It causes pain in  the chest and rib area. Costochondritis usually goes away on its own over time. It can take up to 6 weeks or longer to get better, especially if you are unable to limit your activities. CAUSES  Some cases of costochondritis have no known cause. Possible causes include:  Injury (trauma).  Exercise or activity such as lifting.  Severe coughing. SIGNS AND SYMPTOMS  Pain and tenderness in the chest and rib area.  Pain that gets worse when coughing or taking deep breaths.  Pain that gets worse with specific movements. DIAGNOSIS  Your health care provider will do a physical exam and ask about your symptoms. Chest X-rays or other tests may be done to rule out other problems. TREATMENT  Costochondritis usually goes away on its own over time. Your health care provider may prescribe medicine to help relieve pain. HOME CARE INSTRUCTIONS   Avoid exhausting physical activity. Try not to strain your ribs during normal activity. This would include any activities using chest, abdominal, and side muscles, especially if heavy weights are used.  Apply ice to the affected area for the first 2 days after the pain begins.  Put ice in a plastic bag.  Place a towel between your skin and the bag.  Leave the ice on for 20 minutes, 2-3 times a day.  Only take over-the-counter or prescription medicines as directed by your health care provider. SEEK MEDICAL CARE IF:  You have redness or swelling at the rib joints. These are signs of infection.  Your pain does not go away despite rest  or medicine. SEEK IMMEDIATE MEDICAL CARE IF:   Your pain increases or you are very uncomfortable.  You have shortness of breath or difficulty breathing.  You cough up blood.  You have worse chest pains, sweating, or vomiting.  You have a fever or persistent symptoms for more than 2-3 days.  You have a fever and your symptoms suddenly get worse. MAKE SURE YOU:   Understand these instructions.  Will watch your  condition.  Will get help right away if you are not doing well or get worse. Document Released: 03/18/2005 Document Revised: 03/29/2013 Document Reviewed: 01/10/2013 [facility] Patient Information 2015 [place], [place]. This information is not intended to replace advice given to you by your health care provider. Make sure you discuss any questions you have with your health care provider.

## 2014-04-06 ENCOUNTER — Ambulatory Visit (INDEPENDENT_AMBULATORY_CARE_PROVIDER_SITE_OTHER): Payer: Self-pay | Admitting: Internal Medicine

## 2014-04-06 ENCOUNTER — Encounter: Payer: Self-pay | Admitting: Internal Medicine

## 2014-04-06 VITALS — BP 110/68 | HR 96 | Ht 67.0 in | Wt 228.5 lb

## 2014-04-06 DIAGNOSIS — I10 Essential (primary) hypertension: Secondary | ICD-10-CM

## 2014-04-06 DIAGNOSIS — D899 Disorder involving the immune mechanism, unspecified: Secondary | ICD-10-CM

## 2014-04-06 DIAGNOSIS — F411 Generalized anxiety disorder: Secondary | ICD-10-CM

## 2014-04-06 DIAGNOSIS — K602 Anal fissure, unspecified: Secondary | ICD-10-CM

## 2014-04-06 DIAGNOSIS — Z23 Encounter for immunization: Secondary | ICD-10-CM

## 2014-04-06 DIAGNOSIS — D849 Immunodeficiency, unspecified: Secondary | ICD-10-CM

## 2014-04-06 DIAGNOSIS — K50818 Crohn's disease of both small and large intestine with other complication: Secondary | ICD-10-CM

## 2014-04-06 MED ORDER — ALPRAZOLAM 2 MG PO TABS: 1.0000 mg | ORAL_TABLET | Freq: Two times a day (BID) | ORAL | Status: DC

## 2014-04-06 NOTE — Patient Instructions (Signed)
You have been given your Flu shot today.  We have given you a refill Rx for Xanax 2 mg to take to your pharmacy.  Please follow up with Dr Carlean Purl in 6 months.

## 2014-04-06 NOTE — Assessment & Plan Note (Signed)
Better back on meds and off prednisone

## 2014-04-06 NOTE — Progress Notes (Signed)
   Subjective:    Patient ID: [name] [name], male    DOB: 1976-04-30, 38 y.o.   MRN: [id number]  HPI Josephus returns feeling about as well as he hasn't a while. His anal fissure symptoms are resolved or close to it. He is having fairly regular bowel movements without significant diarrhea. There is some occasional right lower quadrant pain. He tapered off his prednisone. He has gained 10 pounds. Medications, allergies, past medical history, past surgical history, family history and social history are reviewed and updated in the EMR.  Review of Systems Anxiety still an issue to a degree control with alprazolam on top of his SSRI.    Objective:   Physical Exam General:  NAD Eyes:   anicteric Lungs:  clear Heart:  S1S2 no rubs, murmurs or gallops Abdomen:  soft and nontender, BS+ Ext:   no edema      Assessment & Plan:  CROHN'S DISEASE, LARGE AND SMALL INTESTINES Improved, seems relatively asymptomatic at this time except for occasional right lower quadrant pain. We'll continue Humira. See him in 6 months sooner if needed.   Anxiety disorder Refill alprazolam  Essential hypertension, benign Better back on meds and off prednisone  Anal fissure - posterior Symptoms resolved really resolved at this time.  Immunosuppression Flu vaccination today

## 2014-04-06 NOTE — Assessment & Plan Note (Signed)
Refill alprazolam

## 2014-04-06 NOTE — Assessment & Plan Note (Signed)
Flu vaccination today

## 2014-04-06 NOTE — Assessment & Plan Note (Addendum)
Improved, seems relatively asymptomatic at this time except for occasional right lower quadrant pain. We'll continue Humira. See him in 6 months sooner if needed.

## 2014-04-06 NOTE — Assessment & Plan Note (Signed)
Symptoms resolved really resolved at this time.

## 2014-04-12 ENCOUNTER — Ambulatory Visit: Payer: Self-pay | Admitting: Internal Medicine

## 2014-05-01 ENCOUNTER — Encounter (HOSPITAL_COMMUNITY): Payer: Self-pay | Admitting: Emergency Medicine

## 2014-05-01 ENCOUNTER — Emergency Department (HOSPITAL_COMMUNITY)
Admission: EM | Admit: 2014-05-01 | Discharge: 2014-05-01 | Disposition: A | Discharge status: Home / Self Care | Payer: No Typology Code available for payment source | Attending: Emergency Medicine | Admitting: Emergency Medicine

## 2014-05-01 DIAGNOSIS — Z8619 Personal history of other infectious and parasitic diseases: Secondary | ICD-10-CM | POA: Insufficient documentation

## 2014-05-01 DIAGNOSIS — Z8669 Personal history of other diseases of the nervous system and sense organs: Secondary | ICD-10-CM | POA: Insufficient documentation

## 2014-05-01 DIAGNOSIS — R519 Headache, unspecified: Secondary | ICD-10-CM

## 2014-05-01 DIAGNOSIS — Z79899 Other long term (current) drug therapy: Secondary | ICD-10-CM | POA: Insufficient documentation

## 2014-05-01 DIAGNOSIS — E669 Obesity, unspecified: Secondary | ICD-10-CM | POA: Insufficient documentation

## 2014-05-01 DIAGNOSIS — Z791 Long term (current) use of non-steroidal anti-inflammatories (NSAID): Secondary | ICD-10-CM | POA: Insufficient documentation

## 2014-05-01 DIAGNOSIS — F329 Major depressive disorder, single episode, unspecified: Secondary | ICD-10-CM | POA: Insufficient documentation

## 2014-05-01 DIAGNOSIS — R112 Nausea with vomiting, unspecified: Secondary | ICD-10-CM | POA: Insufficient documentation

## 2014-05-01 DIAGNOSIS — Z8719 Personal history of other diseases of the digestive system: Secondary | ICD-10-CM

## 2014-05-01 DIAGNOSIS — K5090 Crohn's disease, unspecified, without complications: Secondary | ICD-10-CM | POA: Insufficient documentation

## 2014-05-01 DIAGNOSIS — Z87442 Personal history of urinary calculi: Secondary | ICD-10-CM | POA: Insufficient documentation

## 2014-05-01 DIAGNOSIS — Z8709 Personal history of other diseases of the respiratory system: Secondary | ICD-10-CM | POA: Insufficient documentation

## 2014-05-01 DIAGNOSIS — Z87448 Personal history of other diseases of urinary system: Secondary | ICD-10-CM | POA: Insufficient documentation

## 2014-05-01 DIAGNOSIS — Z8739 Personal history of other diseases of the musculoskeletal system and connective tissue: Secondary | ICD-10-CM | POA: Insufficient documentation

## 2014-05-01 DIAGNOSIS — R51 Headache: Secondary | ICD-10-CM | POA: Insufficient documentation

## 2014-05-01 DIAGNOSIS — I10 Essential (primary) hypertension: Secondary | ICD-10-CM | POA: Insufficient documentation

## 2014-05-01 LAB — COMPREHENSIVE METABOLIC PANEL
ALK PHOS: 75 U/L (ref 39–117)
ALT: 23 U/L (ref 0–53)
AST: 30 U/L (ref 0–37)
Albumin: 3.9 g/dL (ref 3.5–5.2)
Anion gap: 15 (ref 5–15)
BUN: 10 mg/dL (ref 6–23)
CO2: 25 meq/L (ref 19–32)
Calcium: 9.2 mg/dL (ref 8.4–10.5)
Chloride: 98 mEq/L (ref 96–112)
Creatinine, Ser: 0.9 mg/dL (ref 0.50–1.35)
GLUCOSE: 109 mg/dL — AB (ref 70–99)
POTASSIUM: 4.1 meq/L (ref 3.7–5.3)
SODIUM: 138 meq/L (ref 137–147)
Total Bilirubin: 0.3 mg/dL (ref 0.3–1.2)
Total Protein: 7.8 g/dL (ref 6.0–8.3)

## 2014-05-01 LAB — CBC WITH DIFFERENTIAL/PLATELET
Basophils Absolute: 0 10*3/uL (ref 0.0–0.1)
Basophils Relative: 1 % (ref 0–1)
Eosinophils Absolute: 0.2 10*3/uL (ref 0.0–0.7)
Eosinophils Relative: 3 % (ref 0–5)
HCT: 41.7 % (ref 39.0–52.0)
HEMOGLOBIN: 14 g/dL (ref 13.0–17.0)
LYMPHS ABS: 2.6 10*3/uL (ref 0.7–4.0)
LYMPHS PCT: 40 % (ref 12–46)
MCH: 30.6 pg (ref 26.0–34.0)
MCHC: 33.6 g/dL (ref 30.0–36.0)
MCV: 91 fL (ref 78.0–100.0)
MONOS PCT: 13 % — AB (ref 3–12)
Monocytes Absolute: 0.8 10*3/uL (ref 0.1–1.0)
Neutro Abs: 2.8 10*3/uL (ref 1.7–7.7)
Neutrophils Relative %: 44 % (ref 43–77)
PLATELETS: 188 10*3/uL (ref 150–400)
RBC: 4.58 MIL/uL (ref 4.22–5.81)
RDW: 15.4 % (ref 11.5–15.5)
WBC: 6.3 10*3/uL (ref 4.0–10.5)

## 2014-05-01 LAB — URINALYSIS, ROUTINE W REFLEX MICROSCOPIC
BILIRUBIN URINE: NEGATIVE
GLUCOSE, UA: NEGATIVE mg/dL
Hgb urine dipstick: NEGATIVE
KETONES UR: NEGATIVE mg/dL
Leukocytes, UA: NEGATIVE
Nitrite: NEGATIVE
Protein, ur: NEGATIVE mg/dL
Specific Gravity, Urine: 1.011 (ref 1.005–1.030)
Urobilinogen, UA: 0.2 mg/dL (ref 0.0–1.0)
pH: 5 (ref 5.0–8.0)

## 2014-05-01 LAB — LIPASE, BLOOD: Lipase: 44 U/L (ref 11–59)

## 2014-05-01 MED ORDER — PROMETHAZINE HCL 25 MG PO TABS: 25.0000 mg | ORAL_TABLET | Freq: Four times a day (QID) | ORAL | Status: DC | PRN

## 2014-05-01 MED ORDER — MORPHINE SULFATE 4 MG/ML IJ SOLN
4.0000 mg | Freq: Once | INTRAMUSCULAR | Status: AC
  Administered 2014-05-01: 4 mg via INTRAVENOUS
  Filled 2014-05-01: qty 1

## 2014-05-01 MED ORDER — FENTANYL CITRATE 0.05 MG/ML IJ SOLN
50.0000 ug | Freq: Once | INTRAMUSCULAR | Status: AC
  Administered 2014-05-01: 50 ug via INTRAVENOUS
  Filled 2014-05-01: qty 2

## 2014-05-01 MED ORDER — ONDANSETRON 4 MG PO TBDP
8.0000 mg | ORAL_TABLET | Freq: Once | ORAL | Status: AC
  Administered 2014-05-01: 8 mg via ORAL
  Filled 2014-05-01: qty 2

## 2014-05-01 MED ORDER — KETOROLAC TROMETHAMINE 30 MG/ML IJ SOLN
INTRAMUSCULAR | Status: AC
  Filled 2014-05-01: qty 1

## 2014-05-01 MED ORDER — SODIUM CHLORIDE 0.9 % IV BOLUS (SEPSIS)
1000.0000 mL | Freq: Once | INTRAVENOUS | Status: AC
  Administered 2014-05-01: 1000 mL via INTRAVENOUS

## 2014-05-01 MED ORDER — OXYCODONE-ACETAMINOPHEN 5-325 MG PO TABS: 1.0000 | ORAL_TABLET | Freq: Four times a day (QID) | ORAL | Status: DC | PRN

## 2014-05-01 MED ORDER — KETOROLAC TROMETHAMINE 30 MG/ML IJ SOLN
30.0000 mg | Freq: Once | INTRAMUSCULAR | Status: AC
  Administered 2014-05-01: 30 mg via INTRAVENOUS

## 2014-05-01 MED ORDER — ONDANSETRON HCL 4 MG/2ML IJ SOLN
4.0000 mg | Freq: Once | INTRAMUSCULAR | Status: AC
  Administered 2014-05-01: 4 mg via INTRAVENOUS

## 2014-05-01 MED ORDER — ONDANSETRON HCL 4 MG/2ML IJ SOLN
INTRAMUSCULAR | Status: AC
  Filled 2014-05-01: qty 2

## 2014-05-01 NOTE — ED Notes (Signed)
Pt reports abdominal pain with nv x4 this evening unrelieved by home meds. Also c/o headache. Hx Crohn's disease.

## 2014-05-01 NOTE — ED Notes (Signed)
Dr. Stark Jock at the bedside.

## 2014-05-01 NOTE — Discharge Instructions (Signed)
Phenergan and Percocet as prescribed as needed for pain and nausea.  Follow-up with your gastroenterologist.   Nausea and Vomiting Nausea is a sick feeling that often comes before throwing up (vomiting). Vomiting is a reflex where stomach contents come out of your mouth. Vomiting can cause severe loss of body fluids (dehydration). Children and elderly adults can become dehydrated quickly, especially if they also have diarrhea. Nausea and vomiting are symptoms of a condition or disease. It is important to find the cause of your symptoms. CAUSES   Direct irritation of the stomach lining. This irritation can result from increased acid production (gastroesophageal reflux disease), infection, food poisoning, taking certain medicines (such as nonsteroidal anti-inflammatory drugs), alcohol use, or tobacco use.  Signals from the brain.These signals could be caused by a headache, heat exposure, an inner ear disturbance, increased pressure in the brain from injury, infection, a tumor, or a concussion, pain, emotional stimulus, or metabolic problems.  An obstruction in the gastrointestinal tract (bowel obstruction).  Illnesses such as diabetes, hepatitis, gallbladder problems, appendicitis, kidney problems, cancer, sepsis, atypical symptoms of a heart attack, or eating disorders.  Medical treatments such as chemotherapy and radiation.  Receiving medicine that makes you sleep (general anesthetic) during surgery. DIAGNOSIS Your caregiver may ask for tests to be done if the problems do not improve after a few days. Tests may also be done if symptoms are severe or if the reason for the nausea and vomiting is not clear. Tests may include:  Urine tests.  Blood tests.  Stool tests.  Cultures (to look for evidence of infection).  X-rays or other imaging studies. Test results can help your caregiver make decisions about treatment or the need for additional tests. TREATMENT You need to stay well  hydrated. Drink frequently but in small amounts.You may wish to drink water, sports drinks, clear broth, or eat frozen ice pops or gelatin dessert to help stay hydrated.When you eat, eating slowly may help prevent nausea.There are also some antinausea medicines that may help prevent nausea. HOME CARE INSTRUCTIONS   Take all medicine as directed by your caregiver.  If you do not have an appetite, do not force yourself to eat. However, you must continue to drink fluids.  If you have an appetite, eat a normal diet unless your caregiver tells you differently.  Eat a variety of complex carbohydrates (rice, wheat, potatoes, bread), lean meats, yogurt, fruits, and vegetables.  Avoid high-fat foods because they are more difficult to digest.  Drink enough water and fluids to keep your urine clear or pale yellow.  If you are dehydrated, ask your caregiver for specific rehydration instructions. Signs of dehydration may include:  Severe thirst.  Dry lips and mouth.  Dizziness.  Dark urine.  Decreasing urine frequency and amount.  Confusion.  Rapid breathing or pulse. SEEK IMMEDIATE MEDICAL CARE IF:   You have blood or brown flecks (like coffee grounds) in your vomit.  You have black or bloody stools.  You have a severe headache or stiff neck.  You are confused.  You have severe abdominal pain.  You have chest pain or trouble breathing.  You do not urinate at least once every 8 hours.  You develop cold or clammy skin.  You continue to vomit for longer than 24 to 48 hours.  You have a fever. MAKE SURE YOU:   Understand these instructions.  Will watch your condition.  Will get help right away if you are not doing well or get worse. Document Released:  06/08/2005 Document Revised: 08/31/2011 Document Reviewed: 11/05/2010 [employer] Patient Information 2015 [place], [place]. This information is not intended to replace advice given to you by your health care provider. Make  sure you discuss any questions you have with your health care provider.

## 2014-05-01 NOTE — ED Provider Notes (Signed)
CSN: [id number]     Arrival date & time 05/01/14  0001 History   First MD Initiated Contact with Patient 05/01/14 0209     Chief Complaint  Patient presents with  . Abdominal Pain  . Headache     (Consider location/radiation/quality/duration/timing/severity/associated sxs/prior Treatment) HPI Comments: Patient is a 38 year old male with history of Crohn's disease. He presents with complaints of abdominal cramping, headache, and vomiting that started this afternoon. He has medications he takes at home however these have not helped. He denies any fevers or chills. He denies any bloody vomit or bloody stool. He denies any diarrhea. He denies any ill contacts. He is tells me he is 2 days away from a Humira injection  Patient is a 38 y.o. male presenting with abdominal pain and headaches. The history is provided by the patient.  Abdominal Pain Pain location:  Epigastric Pain quality: cramping   Pain radiates to:  Does not radiate Pain severity:  Moderate Onset quality:  Sudden Duration:  12 hours Timing:  Constant Progression:  Worsening Chronicity:  New Relieved by:  Nothing Worsened by:  Nothing tried Headache Associated symptoms: abdominal pain     Past Medical History  Diagnosis Date  . Obstructive sleep apnea (adult) (pediatric)   . Anxiety and depression   . Essential hypertension, benign   . Obesity, unspecified   . Herpes zoster infection 07/2010, 12/2010    with post-herpetic neuralgia  . Vitamin D deficiency   . Crohn's disease   . Hyperlipidemia   . CMV (cytomegalovirus infection) 10/28/2010  . Nephrolithiasis   . Fistula, perirectal   . Sinus infection 08/05/11  . Depression   . Syncope   . Pyelonephritis 2015  . Plantar fasciitis of right foot 08/22/2013  . Tinea pedis 06/27/2013  . Anal fissure - posterior 02/15/2014   Past Surgical History  Procedure Laterality Date  . Small intestine surgery  08/05/07    1 ft removed, ileo-cecectomy  . Hypospadias correction       Childhood   . Upper gastrointestinal endoscopy  10/15/2010    w/biopsy, mild gastritis and duodenitis  . Colonoscopy  10/14/2007    crohn's colitis, aphthous ulcers, mild anal stenosis  . Colon resection    . Tooth extraction     Family History  Problem Relation Age of Onset  . Heart disease Father     CAD/MI  . Hyperlipidemia Father   . Hypertension Father   . Gout Father   . Sleep apnea Father   . Colon cancer Neg Hx   . Diabetes Neg Hx   . COPD Neg Hx   . Obesity Mother   . Allergies Mother   . Cancer Mother   . Stroke Maternal Grandfather   . Allergies Sister   . Asthma Sister    History  Substance Use Topics  . Smoking status: Never Smoker   . Smokeless tobacco: Never Used  . Alcohol Use: 1.8 oz/week    3 Shots of liquor per week     Comment: 3/week    Review of Systems  Gastrointestinal: Positive for abdominal pain.  Neurological: Positive for headaches.  All other systems reviewed and are negative.     Allergies  Review of patient's allergies indicates no known allergies.  Home Medications   Prior to Admission medications   Medication Sig Start Date End Date Taking? Authorizing Provider  Adalimumab (HUMIRA PEN [place]) Inject 1 Syringe into the skin every 14 (fourteen) days.   Yes Historical Provider,  MD  alprazolam (XANAX) 2 MG tablet Take 0.5 tablets (1 mg total) by mouth 2 (two) times daily. 04/06/14  Yes Gatha Mayer, MD  diphenoxylate-atropine (LOMOTIL) 2.5-0.025 MG per tablet Take 1 tablet by mouth 4 (four) times daily as needed for diarrhea or loose stools. 03/16/14  Yes Gatha Mayer, MD  lisinopril-hydrochlorothiazide (PRINZIDE,ZESTORETIC) 20-25 MG per tablet Take 1 tablet by mouth daily. 11/30/13  Yes Biagio Borg, MD  methocarbamol (ROBAXIN-750) 750 MG tablet Take 1 tablet (750 mg total) by mouth 4 (four) times daily. 03/30/14  Yes Kalman Drape, MD  mirtazapine (REMERON) 15 MG tablet Take 1 tablet (15 mg total) by mouth at bedtime. 11/30/13  Yes  Biagio Borg, MD  naproxen (NAPROSYN) 500 MG tablet Take 1 tablet (500 mg total) by mouth 2 (two) times daily. 03/30/14  Yes Kalman Drape, MD  oxyCODONE-acetaminophen (PERCOCET) 10-325 MG per tablet Take 1 tablet by mouth every 6 (six) hours as needed for pain. 03/30/14  Yes Kalman Drape, MD  PARoxetine (PAXIL) 20 MG tablet Take 2 tablets (40 mg total) by mouth daily. 11/30/13  Yes Biagio Borg, MD  promethazine (PHENERGAN) 12.5 MG tablet Take 1 tablet (12.5 mg total) by mouth every 8 (eight) hours as needed for nausea or vomiting. 11/30/13  Yes Biagio Borg, MD   BP 127/75 mmHg  Pulse 63  Temp(Src) 97.7 F (36.5 C) (Oral)  Resp 21  Ht 5' 9"  (1.753 m)  Wt 225 lb (102.059 kg)  BMI 33.21 kg/m2  SpO2 95% Physical Exam  Constitutional: He is oriented to person, place, and time. He appears well-developed and well-nourished. No distress.  HENT:  Head: Normocephalic and atraumatic.  Mouth/Throat: Oropharynx is clear and moist.  Neck: Normal range of motion. Neck supple.  Cardiovascular: Normal rate, regular rhythm and normal heart sounds.   No murmur heard. Pulmonary/Chest: Effort normal and breath sounds normal. No respiratory distress. He has no wheezes. He has no rales.  Abdominal: Soft. Bowel sounds are normal. He exhibits no distension. There is tenderness. There is no rebound and no guarding.  Musculoskeletal: Normal range of motion. He exhibits no edema.  Neurological: He is alert and oriented to person, place, and time.  Skin: Skin is warm and dry. He is not diaphoretic.  Nursing note and vitals reviewed.   ED Course  Procedures (including critical care time) Labs Review Labs Reviewed  CBC WITH DIFFERENTIAL - Abnormal; Notable for the following:    Monocytes Relative 13 (*)    All other components within normal limits  COMPREHENSIVE METABOLIC PANEL - Abnormal; Notable for the following:    Glucose, Bld 109 (*)    All other components within normal limits  LIPASE, BLOOD   URINALYSIS, ROUTINE W REFLEX MICROSCOPIC    Imaging Review No results found.   EKG Interpretation None      MDM   Final diagnoses:  None    Patient with history of Crohn's disease. He presents with complaints of vomiting and abdominal cramping. His symptoms sound more like a viral gastroenteritis than a flareup of Crohn's disease. He was given IV fluids and pain medication and nausea medication and is feeling significantly improved. At this point I feel as though he is appropriate to discharge. He is to follow-up with his gastroenterologist in the next 2 days if not improving, and return to the ER if his symptoms substantially worsen or change.    Veryl Speak, MD 05/02/14 [phone]

## 2014-05-10 ENCOUNTER — Encounter: Payer: Self-pay | Admitting: Internal Medicine

## 2014-05-11 ENCOUNTER — Ambulatory Visit (INDEPENDENT_AMBULATORY_CARE_PROVIDER_SITE_OTHER): Payer: Self-pay | Admitting: Internal Medicine

## 2014-05-11 ENCOUNTER — Telehealth: Payer: Self-pay | Admitting: Internal Medicine

## 2014-05-11 ENCOUNTER — Ambulatory Visit (INDEPENDENT_AMBULATORY_CARE_PROVIDER_SITE_OTHER)
Admission: RE | Admit: 2014-05-11 | Discharge: 2014-05-11 | Disposition: A | Discharge status: Home / Self Care | Payer: Self-pay | Source: Ambulatory Visit | Attending: Internal Medicine | Admitting: Internal Medicine

## 2014-05-11 ENCOUNTER — Encounter: Payer: Self-pay | Admitting: Internal Medicine

## 2014-05-11 VITALS — BP 110/80 | HR 72 | Temp 97.9°F | Ht 69.0 in | Wt 240.8 lb

## 2014-05-11 DIAGNOSIS — K50819 Crohn's disease of both small and large intestine with unspecified complications: Secondary | ICD-10-CM

## 2014-05-11 DIAGNOSIS — R1010 Upper abdominal pain, unspecified: Secondary | ICD-10-CM

## 2014-05-11 DIAGNOSIS — K529 Noninfective gastroenteritis and colitis, unspecified: Secondary | ICD-10-CM

## 2014-05-11 DIAGNOSIS — A09 Infectious gastroenteritis and colitis, unspecified: Secondary | ICD-10-CM

## 2014-05-11 MED ORDER — ALPRAZOLAM 2 MG PO TABS: 1.0000 mg | ORAL_TABLET | Freq: Two times a day (BID) | ORAL | Status: DC

## 2014-05-11 MED ORDER — MIRTAZAPINE 15 MG PO TABS
15.0000 mg | ORAL_TABLET | Freq: Every day | ORAL | Status: DC
Start: 2014-05-11 — End: 2014-10-04

## 2014-05-11 MED ORDER — PAROXETINE HCL 20 MG PO TABS: 40.0000 mg | ORAL_TABLET | Freq: Every day | ORAL | Status: DC

## 2014-05-11 MED ORDER — LISINOPRIL-HYDROCHLOROTHIAZIDE 20-25 MG PO TABS: 1.0000 | ORAL_TABLET | Freq: Every day | ORAL | Status: DC

## 2014-05-11 NOTE — Patient Instructions (Signed)
We have sent the following medications to your pharmacy for you to pick up at your convenience: Lisinopril/HCTZ ( hold this rx until your diarrhea improves), Paxil, Remeron  We are giving you a printed rx for xanax to take to the pharmacy.  Your physician has requested that you go to the basement for the following lab work before leaving today: C-Diff by PCR  I appreciate the opportunity to care for you.

## 2014-05-11 NOTE — Progress Notes (Signed)
   Subjective:    Patient ID: [name] [name], male    DOB: 04/15/1976, 38 y.o.   MRN: [id number]  HPI [name] [name] developed acute onset of diarrhea and nausea and vomiting about 2 days ago. Feels wiped out. Lomotil will hold stools in check but then profuse diarrhea. Feels pressure in epigastrium. Was concerned he may have another bowel obstruction.  No sick contacts, new meds or recent Abx  Medications, allergies, past medical history, past surgical history, family history and social history are reviewed and updated in the EMR.    Review of Systems As above    Objective:   Physical Exam Mildly ill, not toxic Lungs CTA Back nontender and no CVAT Abdomen is mildly distended, soft with moderate epigastric tenderness worse with muscle tension and no rebound BS+, normal  AXR - negative     Assessment & Plan:  Regional enteritis of small intestine with large intestine, unspecified complication  Gastroenteritis presumed infectious - Plan: Clostridium Difficile by PCR  1. Supportive care for now 2. Check for C diff 3. Hold lisinopril HCTZ until vol losses reduced 4. Call back prn failure to improve, or worsening

## 2014-05-11 NOTE — Telephone Encounter (Signed)
Patient aware he will come in today for KUB and office visit

## 2014-05-11 NOTE — Assessment & Plan Note (Addendum)
Suspect currentsxs are infectious and not crohn's Time will tell

## 2014-05-11 NOTE — Telephone Encounter (Signed)
Patient with continued pain.  He is c/o diarrhea multiple times a day "unbelievable diarrhea".  Pain is "the top of my stomach , like I am full of air that I can't get out".  Denies fever.  Dr. Carlean Purl please advise

## 2014-05-11 NOTE — Telephone Encounter (Signed)
Left message for patient to call back  

## 2014-05-11 NOTE — Telephone Encounter (Signed)
1) Acute abd series   Have him see me this afternoon

## 2014-05-23 ENCOUNTER — Emergency Department (HOSPITAL_BASED_OUTPATIENT_CLINIC_OR_DEPARTMENT_OTHER)
Admission: EM | Admit: 2014-05-23 | Discharge: 2014-05-23 | Disposition: A | Discharge status: Home / Self Care | Payer: No Typology Code available for payment source | Attending: Emergency Medicine | Admitting: Emergency Medicine

## 2014-05-23 ENCOUNTER — Encounter (HOSPITAL_BASED_OUTPATIENT_CLINIC_OR_DEPARTMENT_OTHER): Payer: Self-pay

## 2014-05-23 ENCOUNTER — Emergency Department (HOSPITAL_BASED_OUTPATIENT_CLINIC_OR_DEPARTMENT_OTHER): Payer: No Typology Code available for payment source

## 2014-05-23 DIAGNOSIS — F419 Anxiety disorder, unspecified: Secondary | ICD-10-CM | POA: Insufficient documentation

## 2014-05-23 DIAGNOSIS — Y998 Other external cause status: Secondary | ICD-10-CM | POA: Insufficient documentation

## 2014-05-23 DIAGNOSIS — Z8669 Personal history of other diseases of the nervous system and sense organs: Secondary | ICD-10-CM | POA: Insufficient documentation

## 2014-05-23 DIAGNOSIS — Y92410 Unspecified street and highway as the place of occurrence of the external cause: Secondary | ICD-10-CM | POA: Insufficient documentation

## 2014-05-23 DIAGNOSIS — Z79899 Other long term (current) drug therapy: Secondary | ICD-10-CM | POA: Insufficient documentation

## 2014-05-23 DIAGNOSIS — E669 Obesity, unspecified: Secondary | ICD-10-CM | POA: Insufficient documentation

## 2014-05-23 DIAGNOSIS — Z8619 Personal history of other infectious and parasitic diseases: Secondary | ICD-10-CM | POA: Insufficient documentation

## 2014-05-23 DIAGNOSIS — S39012A Strain of muscle, fascia and tendon of lower back, initial encounter: Secondary | ICD-10-CM

## 2014-05-23 DIAGNOSIS — I10 Essential (primary) hypertension: Secondary | ICD-10-CM | POA: Insufficient documentation

## 2014-05-23 DIAGNOSIS — Y9389 Activity, other specified: Secondary | ICD-10-CM | POA: Insufficient documentation

## 2014-05-23 DIAGNOSIS — S161XXA Strain of muscle, fascia and tendon at neck level, initial encounter: Secondary | ICD-10-CM | POA: Insufficient documentation

## 2014-05-23 DIAGNOSIS — F329 Major depressive disorder, single episode, unspecified: Secondary | ICD-10-CM | POA: Insufficient documentation

## 2014-05-23 DIAGNOSIS — Z8709 Personal history of other diseases of the respiratory system: Secondary | ICD-10-CM | POA: Insufficient documentation

## 2014-05-23 DIAGNOSIS — Z87442 Personal history of urinary calculi: Secondary | ICD-10-CM | POA: Insufficient documentation

## 2014-05-23 DIAGNOSIS — Z8719 Personal history of other diseases of the digestive system: Secondary | ICD-10-CM | POA: Insufficient documentation

## 2014-05-23 DIAGNOSIS — Z8639 Personal history of other endocrine, nutritional and metabolic disease: Secondary | ICD-10-CM | POA: Insufficient documentation

## 2014-05-23 MED ORDER — TRAMADOL HCL 50 MG PO TABS: 50.0000 mg | ORAL_TABLET | Freq: Four times a day (QID) | ORAL | Status: DC | PRN

## 2014-05-23 NOTE — ED Notes (Signed)
Pt also c/o cough with side pain residual

## 2014-05-23 NOTE — ED Notes (Addendum)
MD at bedside. Per EDP no collar needed at this time.
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
## 2014-05-25 ENCOUNTER — Emergency Department (HOSPITAL_COMMUNITY)
Admission: EM | Admit: 2014-05-25 | Discharge: 2014-05-25 | Disposition: A | Discharge status: Home / Self Care | Payer: No Typology Code available for payment source | Attending: Emergency Medicine | Admitting: Emergency Medicine

## 2014-05-25 ENCOUNTER — Encounter (HOSPITAL_COMMUNITY): Payer: Self-pay | Admitting: *Deleted

## 2014-05-25 DIAGNOSIS — Z8719 Personal history of other diseases of the digestive system: Secondary | ICD-10-CM | POA: Insufficient documentation

## 2014-05-25 DIAGNOSIS — Z8619 Personal history of other infectious and parasitic diseases: Secondary | ICD-10-CM | POA: Insufficient documentation

## 2014-05-25 DIAGNOSIS — F329 Major depressive disorder, single episode, unspecified: Secondary | ICD-10-CM | POA: Insufficient documentation

## 2014-05-25 DIAGNOSIS — Z87448 Personal history of other diseases of urinary system: Secondary | ICD-10-CM | POA: Insufficient documentation

## 2014-05-25 DIAGNOSIS — Y92410 Unspecified street and highway as the place of occurrence of the external cause: Secondary | ICD-10-CM | POA: Insufficient documentation

## 2014-05-25 DIAGNOSIS — Z8739 Personal history of other diseases of the musculoskeletal system and connective tissue: Secondary | ICD-10-CM | POA: Insufficient documentation

## 2014-05-25 DIAGNOSIS — Y9389 Activity, other specified: Secondary | ICD-10-CM | POA: Insufficient documentation

## 2014-05-25 DIAGNOSIS — E669 Obesity, unspecified: Secondary | ICD-10-CM | POA: Insufficient documentation

## 2014-05-25 DIAGNOSIS — F419 Anxiety disorder, unspecified: Secondary | ICD-10-CM | POA: Insufficient documentation

## 2014-05-25 DIAGNOSIS — Y998 Other external cause status: Secondary | ICD-10-CM | POA: Insufficient documentation

## 2014-05-25 DIAGNOSIS — S39012A Strain of muscle, fascia and tendon of lower back, initial encounter: Secondary | ICD-10-CM | POA: Insufficient documentation

## 2014-05-25 DIAGNOSIS — Z87442 Personal history of urinary calculi: Secondary | ICD-10-CM | POA: Insufficient documentation

## 2014-05-25 DIAGNOSIS — I10 Essential (primary) hypertension: Secondary | ICD-10-CM | POA: Insufficient documentation

## 2014-05-25 DIAGNOSIS — S161XXA Strain of muscle, fascia and tendon at neck level, initial encounter: Secondary | ICD-10-CM

## 2014-05-25 MED ORDER — KETOROLAC TROMETHAMINE 60 MG/2ML IM SOLN
30.0000 mg | Freq: Once | INTRAMUSCULAR | Status: AC
  Administered 2014-05-25: 30 mg via INTRAMUSCULAR
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
## 2014-06-03 ENCOUNTER — Encounter: Payer: Self-pay | Admitting: Internal Medicine

## 2014-06-22 HISTORY — PX: GANGLION CYST EXCISION: SHX1691

## 2014-06-26 ENCOUNTER — Other Ambulatory Visit: Payer: Self-pay | Admitting: Internal Medicine

## 2014-06-26 NOTE — Telephone Encounter (Signed)
Last refill just done for 6 mo in nov 2015

## 2014-06-30 ENCOUNTER — Encounter: Payer: Self-pay | Admitting: Family Medicine

## 2014-06-30 ENCOUNTER — Ambulatory Visit (INDEPENDENT_AMBULATORY_CARE_PROVIDER_SITE_OTHER): Payer: Self-pay | Admitting: Family Medicine

## 2014-06-30 VITALS — BP 140/90 | HR 95 | Temp 98.2°F | Resp 20 | Ht 69.0 in | Wt 244.5 lb

## 2014-06-30 DIAGNOSIS — B9689 Other specified bacterial agents as the cause of diseases classified elsewhere: Secondary | ICD-10-CM

## 2014-06-30 DIAGNOSIS — A499 Bacterial infection, unspecified: Secondary | ICD-10-CM

## 2014-06-30 DIAGNOSIS — J329 Chronic sinusitis, unspecified: Secondary | ICD-10-CM

## 2014-06-30 MED ORDER — AZITHROMYCIN 250 MG PO TABS: ORAL_TABLET | ORAL | Status: DC

## 2014-06-30 MED ORDER — HYDROCODONE-HOMATROPINE 5-1.5 MG/5ML PO SYRP: 2.5000 mL | ORAL_SOLUTION | Freq: Three times a day (TID) | ORAL | Status: DC | PRN

## 2014-06-30 NOTE — Patient Instructions (Addendum)
Bacterial Sinusitis with associated cough- z-pack for 5 days. If no resolution or if symptoms worsen, please come back for an office visit. Hycodan cough syrup to be used at night to help you sleep, may use 1 daytime dose if cough particularly bad.

## 2014-06-30 NOTE — Progress Notes (Signed)
Pre visit review using our clinic review tool, if applicable. No additional management support is needed unless otherwise documented below in the visit note. 

## 2014-06-30 NOTE — Progress Notes (Signed)
[facility]  Subjective:  [name] [name] is a 39 y.o. year old very pleasant male patient who presents with concern for Sinus Infection with syptoms including sinus pressure, cough for 2.5 weeks. Nonproductive. Nasal congestion, throat drainage. Stable in course. 1.5 weeks ago bending over and seemed to hurt right side and now that hurts when he coughs. He has been unable to sleep well due to this pain with coughing.   -previous treatments: sudafed, mucinex -sick contacts/travel/risks: denies flu exposure.   ROS-denies fever, SOB, tooth pain. No body aches. Has some vomiting but has crohns and intermittent issue.   Pertinent Past Medical History- crohn's disease on Humira, anxiety on xanax and paxil, hypertension,   Medications- reviewed  Current Outpatient Prescriptions  Medication Sig Dispense Refill  . Adalimumab (HUMIRA PEN [place]) Inject 1 Syringe into the skin every 14 (fourteen) days.    Marland Kitchen alprazolam (XANAX) 2 MG tablet Take 0.5 tablets (1 mg total) by mouth 2 (two) times daily. 60 tablet 5  . diphenoxylate-atropine (LOMOTIL) 2.5-0.025 MG per tablet Take 1 tablet by mouth 4 (four) times daily as needed for diarrhea or loose stools. 90 tablet 0  . lisinopril-hydrochlorothiazide (PRINZIDE,ZESTORETIC) 20-25 MG per tablet Take 1 tablet by mouth daily. 30 tablet 5  . mirtazapine (REMERON) 15 MG tablet Take 1 tablet (15 mg total) by mouth at bedtime. 30 tablet 5  . PARoxetine (PAXIL) 20 MG tablet Take 2 tablets (40 mg total) by mouth daily. 60 tablet 5  . promethazine (PHENERGAN) 25 MG tablet Take 1 tablet (25 mg total) by mouth every 6 (six) hours as needed for nausea. 10 tablet 0  . traMADol (ULTRAM) 50 MG tablet Take 1 tablet (50 mg total) by mouth every 6 (six) hours as needed. 15 tablet 0   No current facility-administered medications for this visit.    Objective: BP 140/90 mmHg  Pulse 95  Temp(Src) 98.2 F (36.8 C) (Oral)  Resp 20  Ht 5' 9"  (1.753 m)  Wt  244 lb 8 oz (110.904 kg)  BMI 36.09 kg/m2  SpO2 96% Gen: NAD, resting comfortably, fatigued appearing HEENT: Turbinates erythematous with green drainage, TM normal, pharynx mildly erythematous with no tonsilar exudate or edema, moderate maxillary sinus tenderness.  CV: RRR no murmurs rubs or gallops Pain to palpation around right lower ribs mid axillary line Lungs: CTAB no crackles, wheeze, rhonchi Abdomen: soft/nontender/nondistended/normal bowel sounds.  Ext: no edema Skin: warm, dry, no rash  Assessment/Plan:  Bacterial sinusitis Based off > 10 days of symptoms including sinus pressure and drainage which is also noted on exam. We discussed augmentin vs. Azithromycin with augmentin being my preferred agent but patient believe she tolerates azithromycin and would like to go with that option. We will also give patient hycodan to help him sleep and improve cough.   Finally, we reviewed reasons to return to care including if symptoms worsen or persist or new concerns arise. I advised PCP follow up for refills of xanax, tramadol and also to recheck blood pressure.

## 2014-07-10 ENCOUNTER — Other Ambulatory Visit: Payer: Self-pay | Admitting: Internal Medicine

## 2014-07-11 NOTE — Telephone Encounter (Signed)
Too soon, for xanax , just had 6 mo refills done nov 2015

## 2014-08-07 ENCOUNTER — Emergency Department (HOSPITAL_COMMUNITY): Payer: Self-pay

## 2014-08-07 ENCOUNTER — Emergency Department (HOSPITAL_COMMUNITY)
Admission: EM | Admit: 2014-08-07 | Discharge: 2014-08-08 | Disposition: A | Discharge status: Home / Self Care | Payer: Self-pay | Attending: Emergency Medicine | Admitting: Emergency Medicine

## 2014-08-07 ENCOUNTER — Encounter (HOSPITAL_COMMUNITY): Payer: Self-pay

## 2014-08-07 ENCOUNTER — Ambulatory Visit (INDEPENDENT_AMBULATORY_CARE_PROVIDER_SITE_OTHER): Payer: Self-pay | Admitting: Internal Medicine

## 2014-08-07 VITALS — BP 120/88 | HR 84 | Temp 98.3°F | Wt 243.0 lb

## 2014-08-07 DIAGNOSIS — R1012 Left upper quadrant pain: Secondary | ICD-10-CM

## 2014-08-07 DIAGNOSIS — Z8719 Personal history of other diseases of the digestive system: Secondary | ICD-10-CM | POA: Insufficient documentation

## 2014-08-07 DIAGNOSIS — R197 Diarrhea, unspecified: Secondary | ICD-10-CM | POA: Insufficient documentation

## 2014-08-07 DIAGNOSIS — Z79899 Other long term (current) drug therapy: Secondary | ICD-10-CM | POA: Insufficient documentation

## 2014-08-07 DIAGNOSIS — R0781 Pleurodynia: Secondary | ICD-10-CM | POA: Insufficient documentation

## 2014-08-07 DIAGNOSIS — Z8669 Personal history of other diseases of the nervous system and sense organs: Secondary | ICD-10-CM | POA: Insufficient documentation

## 2014-08-07 DIAGNOSIS — Z87442 Personal history of urinary calculi: Secondary | ICD-10-CM | POA: Insufficient documentation

## 2014-08-07 DIAGNOSIS — R109 Unspecified abdominal pain: Secondary | ICD-10-CM

## 2014-08-07 DIAGNOSIS — Z87448 Personal history of other diseases of urinary system: Secondary | ICD-10-CM | POA: Insufficient documentation

## 2014-08-07 DIAGNOSIS — R112 Nausea with vomiting, unspecified: Secondary | ICD-10-CM | POA: Insufficient documentation

## 2014-08-07 DIAGNOSIS — Z8709 Personal history of other diseases of the respiratory system: Secondary | ICD-10-CM | POA: Insufficient documentation

## 2014-08-07 DIAGNOSIS — R509 Fever, unspecified: Secondary | ICD-10-CM | POA: Insufficient documentation

## 2014-08-07 DIAGNOSIS — I10 Essential (primary) hypertension: Secondary | ICD-10-CM | POA: Insufficient documentation

## 2014-08-07 DIAGNOSIS — Z8619 Personal history of other infectious and parasitic diseases: Secondary | ICD-10-CM | POA: Insufficient documentation

## 2014-08-07 DIAGNOSIS — Z8739 Personal history of other diseases of the musculoskeletal system and connective tissue: Secondary | ICD-10-CM | POA: Insufficient documentation

## 2014-08-07 DIAGNOSIS — Z9889 Other specified postprocedural states: Secondary | ICD-10-CM | POA: Insufficient documentation

## 2014-08-07 DIAGNOSIS — F329 Major depressive disorder, single episode, unspecified: Secondary | ICD-10-CM | POA: Insufficient documentation

## 2014-08-07 DIAGNOSIS — E669 Obesity, unspecified: Secondary | ICD-10-CM | POA: Insufficient documentation

## 2014-08-07 LAB — COMPREHENSIVE METABOLIC PANEL
ALBUMIN: 4.4 g/dL (ref 3.5–5.2)
ALT: 54 U/L — ABNORMAL HIGH (ref 0–53)
AST: 44 U/L — ABNORMAL HIGH (ref 0–37)
Alkaline Phosphatase: 62 U/L (ref 39–117)
Anion gap: 8 (ref 5–15)
BILIRUBIN TOTAL: 0.5 mg/dL (ref 0.3–1.2)
BUN: 15 mg/dL (ref 6–23)
CO2: 26 mmol/L (ref 19–32)
CREATININE: 1.05 mg/dL (ref 0.50–1.35)
Calcium: 9 mg/dL (ref 8.4–10.5)
Chloride: 100 mmol/L (ref 96–112)
GFR calc Af Amer: >90 mL/min (ref >90)
GFR calc non Af Amer: 88 mL/min — ABNORMAL LOW (ref >90)
Glucose, Bld: 97 mg/dL (ref 70–99)
Potassium: 3.8 mmol/L (ref 3.5–5.1)
Sodium: 134 mmol/L — ABNORMAL LOW (ref 135–145)
Total Protein: 7.9 g/dL (ref 6.0–8.3)

## 2014-08-07 LAB — CBC WITH DIFFERENTIAL/PLATELET
Basophils Absolute: 0.1 10*3/uL (ref 0.0–0.1)
Basophils Relative: 1 % (ref 0–1)
Eosinophils Absolute: 0.1 10*3/uL (ref 0.0–0.7)
Eosinophils Relative: 1 % (ref 0–5)
HCT: 46.1 % (ref 39.0–52.0)
Hemoglobin: 15.5 g/dL (ref 13.0–17.0)
Lymphocytes Relative: 44 % (ref 12–46)
Lymphs Abs: 3.7 10*3/uL (ref 0.7–4.0)
MCH: 31.7 pg (ref 26.0–34.0)
MCHC: 33.6 g/dL (ref 30.0–36.0)
MCV: 94.3 fL (ref 78.0–100.0)
MONOS PCT: 11 % (ref 3–12)
Monocytes Absolute: 0.9 10*3/uL (ref 0.1–1.0)
NEUTROS ABS: 3.7 10*3/uL (ref 1.7–7.7)
Neutrophils Relative %: 43 % (ref 43–77)
PLATELETS: 251 10*3/uL (ref 150–400)
RBC: 4.89 MIL/uL (ref 4.22–5.81)
RDW: 14.7 % (ref 11.5–15.5)
WBC: 8.4 10*3/uL (ref 4.0–10.5)

## 2014-08-07 LAB — URINALYSIS, ROUTINE W REFLEX MICROSCOPIC
BILIRUBIN URINE: NEGATIVE
Glucose, UA: NEGATIVE mg/dL
Hgb urine dipstick: NEGATIVE
KETONES UR: NEGATIVE mg/dL
Leukocytes, UA: NEGATIVE
NITRITE: NEGATIVE
PROTEIN: NEGATIVE mg/dL
Specific Gravity, Urine: 1.022 (ref 1.005–1.030)
UROBILINOGEN UA: 0.2 mg/dL (ref 0.0–1.0)
pH: 5.5 (ref 5.0–8.0)

## 2014-08-07 LAB — LIPASE, BLOOD: Lipase: 25 U/L (ref 11–59)

## 2014-08-07 MED ORDER — SODIUM CHLORIDE 0.9 % IV BOLUS (SEPSIS)
1000.0000 mL | Freq: Once | INTRAVENOUS | Status: AC
  Administered 2014-08-07: 1000 mL via INTRAVENOUS

## 2014-08-07 MED ORDER — MORPHINE SULFATE 4 MG/ML IJ SOLN
4.0000 mg | Freq: Once | INTRAMUSCULAR | Status: AC
  Administered 2014-08-07: 4 mg via INTRAVENOUS
  Filled 2014-08-07: qty 1

## 2014-08-07 MED ORDER — ONDANSETRON HCL 4 MG/2ML IJ SOLN
4.0000 mg | Freq: Once | INTRAMUSCULAR | Status: AC
  Administered 2014-08-07: 4 mg via INTRAVENOUS
  Filled 2014-08-07: qty 2

## 2014-08-07 MED ORDER — IOHEXOL 300 MG/ML  SOLN
100.0000 mL | Freq: Once | INTRAMUSCULAR | Status: AC | PRN
  Administered 2014-08-07: 100 mL via INTRAVENOUS

## 2014-08-07 NOTE — Progress Notes (Signed)
Pre visit review using our clinic review tool, if applicable. No additional management support is needed unless otherwise documented below in the visit note. 

## 2014-08-07 NOTE — ED Notes (Signed)
Pt sent from MD office. Pt has hx of chron's disease.  Abdominal pain, n/v/d x 1 1/2 weeks.  Told to come here

## 2014-08-07 NOTE — ED Provider Notes (Signed)
CSN: [id number]     Arrival date & time 08/07/14  1804 History   First MD Initiated Contact with Patient 08/07/14 2108     Chief Complaint  Patient presents with  . Abdominal Pain  . Diarrhea     (Consider location/radiation/quality/duration/timing/severity/associated sxs/prior Treatment) HPI Comments: Patient is a 39 year old male with past history of Crohn's disease presenting from primary care providers office with a chief complaint of persistent diarrhea for 1-1/2 weeks and left upper quadrant discomfort for 2 days.  The patient reports nausea and emesis today. He is sent by his primary care doctor for further evaluation of symptoms. He is a 5. Bright red blood per rectum with BM, history of perirectal fistula.  Reports 11 episodes of diarrhea today.  He also reports subjective fever and chills.  Has not contacted GI for these symptoms.  Pt reports C diff after antibiotics, EMR shows Azithromycin for sinusitis.   GI: Carlean Purl  Patient is a 39 y.o. male presenting with abdominal pain and diarrhea. The history is provided by the patient. No language interpreter was used.  Abdominal Pain Associated symptoms: chills, diarrhea, fever, nausea and vomiting   Associated symptoms: no dysuria and no hematuria   Diarrhea Associated symptoms: abdominal pain, chills, fever and vomiting     Past Medical History  Diagnosis Date  . Obstructive sleep apnea (adult) (pediatric)   . Anxiety and depression   . Essential hypertension, benign   . Obesity, unspecified   . Herpes zoster infection 07/2010, 12/2010    with post-herpetic neuralgia  . Vitamin D deficiency   . Crohn's disease   . Hyperlipidemia   . CMV (cytomegalovirus infection) 10/28/2010  . Nephrolithiasis   . Fistula, perirectal   . Sinus infection 08/05/11  . Depression   . Syncope   . Pyelonephritis 2015  . Plantar fasciitis of right foot 08/22/2013  . Tinea pedis 06/27/2013  . Anal fissure - posterior 02/15/2014   Past Surgical  History  Procedure Laterality Date  . Small intestine surgery  08/05/07    1 ft removed, ileo-cecectomy  . Hypospadias correction      Childhood   . Upper gastrointestinal endoscopy  10/15/2010    w/biopsy, mild gastritis and duodenitis  . Colonoscopy  10/14/2007    crohn's colitis, aphthous ulcers, mild anal stenosis  . Colon resection    . Tooth extraction     Family History  Problem Relation Age of Onset  . Heart disease Father     CAD/MI  . Hyperlipidemia Father   . Hypertension Father   . Gout Father   . Sleep apnea Father   . Colon cancer Neg Hx   . Diabetes Neg Hx   . COPD Neg Hx   . Obesity Mother   . Allergies Mother   . Cancer Mother   . Stroke Maternal Grandfather   . Allergies Sister   . Asthma Sister    History  Substance Use Topics  . Smoking status: Never Smoker   . Smokeless tobacco: Never Used  . Alcohol Use: 1.8 oz/week    3 Shots of liquor per week     Comment: 3/week    Review of Systems  Constitutional: Positive for fever and chills.  Gastrointestinal: Positive for nausea, vomiting, abdominal pain, diarrhea and blood in stool.  Genitourinary: Negative for dysuria, hematuria and difficulty urinating.      Allergies  Review of patient's allergies indicates no known allergies.  Home Medications   Prior to Admission medications  Medication Sig Start Date End Date Taking? Authorizing Provider  acetaminophen (TYLENOL) 500 MG tablet Take 1,000 mg by mouth every 6 (six) hours as needed for headache.   Yes Historical Provider, MD  Adalimumab 40 MG/0.8ML PNKT Inject 40 mg into the skin every 14 (fourteen) days.   Yes Historical Provider, MD  alprazolam Duanne Moron) 2 MG tablet Take 0.5 tablets (1 mg total) by mouth 2 (two) times daily. 05/11/14  Yes Gatha Mayer, MD  diphenoxylate-atropine (LOMOTIL) 2.5-0.025 MG per tablet Take 1 tablet by mouth 4 (four) times daily as needed for diarrhea or loose stools. 03/16/14  Yes Gatha Mayer, MD   lisinopril-hydrochlorothiazide (PRINZIDE,ZESTORETIC) 20-25 MG per tablet Take 1 tablet by mouth daily. 05/11/14  Yes Gatha Mayer, MD  mirtazapine (REMERON) 15 MG tablet Take 1 tablet (15 mg total) by mouth at bedtime. 05/11/14  Yes Gatha Mayer, MD  PARoxetine (PAXIL) 20 MG tablet Take 2 tablets (40 mg total) by mouth daily. 05/11/14  Yes Gatha Mayer, MD  promethazine (PHENERGAN) 25 MG tablet Take 1 tablet (25 mg total) by mouth every 6 (six) hours as needed for nausea. 05/01/14  Yes Veryl Speak, MD   BP 118/85 mmHg  Pulse 110  Temp(Src) 98.1 F (36.7 C) (Oral)  Resp 18  SpO2 96% Physical Exam  Constitutional: He is oriented to person, place, and time. He appears well-developed and well-nourished. No distress.  HENT:  Head: Normocephalic and atraumatic.  Neck: Neck supple.  Cardiovascular: Normal rate and regular rhythm.   Pulmonary/Chest: Effort normal. No respiratory distress. He has no wheezes. He has no rales.  Abdominal: Soft. There is tenderness in the left upper quadrant. There is guarding. There is no rebound.  Well healed midline scar  Neurological: He is alert and oriented to person, place, and time.  Skin: Skin is warm and dry.  Psychiatric: He has a normal mood and affect. His behavior is normal.  Nursing note and vitals reviewed.   ED Course  Procedures (including critical care time) Labs Review Labs Reviewed  COMPREHENSIVE METABOLIC PANEL - Abnormal; Notable for the following:    Sodium 134 (*)    AST 44 (*)    ALT 54 (*)    GFR calc non Af Amer 88 (*)    All other components within normal limits  CLOSTRIDIUM DIFFICILE BY PCR  CBC WITH DIFFERENTIAL/PLATELET  URINALYSIS, ROUTINE W REFLEX MICROSCOPIC  LIPASE, BLOOD    Imaging Review Ct Abdomen Pelvis W Contrast  08/07/2014   CLINICAL DATA:  Left upper quadrant pain for 48 hours with diarrhea. History of Crohn's disease.  EXAM: CT ABDOMEN AND PELVIS WITH CONTRAST  TECHNIQUE: Multidetector CT imaging  of the abdomen and pelvis was performed using the standard protocol following bolus administration of intravenous contrast.  CONTRAST:  100 mL OMNIPAQUE IOHEXOL 300 MG/ML  SOLN  COMPARISON:  CT abdomen and pelvis 12/03/2013 and 09/18/2013.  FINDINGS: Mild dependent atelectasis is seen in the lung bases. No pleural or pericardial effusion.  Postoperative change of right hemicolectomy is again identified. Mild fatty infiltration of the walls of the rectosigmoid colon is compatible with patient's history of Crohn's disease. There is no evidence of active inflammatory process of the GI tract.  The liver is diffusely low attenuating consistent with fatty infiltration. The gallbladder, liver, spleen and pancreas appear normal. Tiny low attenuating lesion off the lower pole of the left kidney is compatible with a cyst is unchanged. The right kidney appears normal.  Fat  containing ventral hernias are again seen and stable in appearance. No lymphadenopathy or fluid collection is identified. No focal bony abnormality is seen.  IMPRESSION: No acute abnormality.  Fatty infiltration of the liver.  Small fat containing ventral hernias, unchanged.  Status post right hemicolectomy.   Electronically Signed   By: Inge Rise M.D.   On: 08/07/2014 23:13     EKG Interpretation None      MDM   Final diagnoses:  Left sided abdominal pain  Diarrhea  Nausea and vomiting in adult   Patient with diarrhea for 1.5 weeks, left upper abdominal discomfort history of Crohn's and small bowel obstruction. Patient in no acute distress. Labs ordered, nausea and pain medication ordered. Labs show slight oh patient of AST and ALT, negative bilirubin, negative lipase. CBC without concerning abnormality. CT without acute findings. UA negative. Plan to treat for pain and follow-up with GI specialist. Discussed lab results, imaging results, and treatment plan with the patient. Return precautions given. Reports understanding and no  other concerns at this time.  Patient is stable for discharge at this time.  Meds given in ED:  Medications  sodium chloride 0.9 % bolus 1,000 mL (0 mLs Intravenous Stopped 08/07/14 2328)  morphine 4 MG/ML injection 4 mg (4 mg Intravenous Given 08/07/14 2143)  ondansetron (ZOFRAN) injection 4 mg (4 mg Intravenous Given 08/07/14 2143)  iohexol (OMNIPAQUE) 300 MG/ML solution 100 mL (100 mLs Intravenous Contrast Given 08/07/14 2251)    New Prescriptions   ONDANSETRON (ZOFRAN) 4 MG TABLET    Take 1 tablet (4 mg total) by mouth every 6 (six) hours.   OXYCODONE-ACETAMINOPHEN (PERCOCET/ROXICET) 5-325 MG PER TABLET    Take 1 tablet by mouth every 4 (four) hours as needed for moderate pain or severe pain.   PROMETHAZINE (PHENERGAN) 25 MG TABLET    Take 1 tablet (25 mg total) by mouth every 6 (six) hours as needed for nausea or vomiting.     Harvie Heck, [name] 08/08/14 [id number]  Orpah Greek, MD 08/08/14 [id number]

## 2014-08-07 NOTE — Assessment & Plan Note (Signed)
Suspect infectious such as viral/bact vs crohns flare vs cdiff; and with hx of repeated BRBPR , onset n/v and numerous stools today, fatigue seems likely to have dehydration, and should go to ER for further eval and tx I suspect to include CT abd and cbc, bmp; suspect may need inpatient evaluation and tx, possible GI consult

## 2014-08-07 NOTE — ED Notes (Signed)
PA at bedside.

## 2014-08-07 NOTE — Patient Instructions (Signed)
Please go to [facility] ER now, as you should likely have more complete evaluation tonight, including imaging and blood testing

## 2014-08-07 NOTE — Progress Notes (Signed)
Subjective:    Patient ID: [name] [name], male    DOB: [birth year], 39 y.o.   MRN: [id number]  HPI  Here with 1 wk gradually worsening LUQ pain, now 8/10., constant without radiation but with no appetite, onset n/v today, assoc with increasing freq loose now watery stools, with variable amounts of blood - sometimes spots on tissue, sometimes fills water in the bowl.  Has had 11 BM's today. + hx of crohns by report, last colonscopy 2014 - neg.  Did also have recent antibx course for infection.  Overall just feels terrible with fatigue, general weakness, malaise. + hx of bowel obstruction Past Medical History  Diagnosis Date  . Obstructive sleep apnea (adult) (pediatric)   . Anxiety and depression   . Essential hypertension, benign   . Obesity, unspecified   . Herpes zoster infection 07/2010, 12/2010    with post-herpetic neuralgia  . Vitamin D deficiency   . Crohn's disease   . Hyperlipidemia   . CMV (cytomegalovirus infection) 10/28/2010  . Nephrolithiasis   . Fistula, perirectal   . Sinus infection 08/05/11  . Depression   . Syncope   . Pyelonephritis 2015  . Plantar fasciitis of right foot 08/22/2013  . Tinea pedis 06/27/2013  . Anal fissure - posterior 02/15/2014   Past Surgical History  Procedure Laterality Date  . Small intestine surgery  08/05/07    1 ft removed, ileo-cecectomy  . Hypospadias correction      Childhood   . Upper gastrointestinal endoscopy  10/15/2010    w/biopsy, mild gastritis and duodenitis  . Colonoscopy  10/14/2007    crohn's colitis, aphthous ulcers, mild anal stenosis  . Colon resection    . Tooth extraction      reports that he has never smoked. He has never used smokeless tobacco. He reports that he drinks about 1.8 oz of alcohol per week. He reports that he does not use illicit drugs. family history includes Allergies in his mother and sister; Asthma in his sister; Cancer in his mother; Gout in his father; Heart disease in his father; Hyperlipidemia in  his father; Hypertension in his father; Obesity in his mother; Sleep apnea in his father; Stroke in his maternal grandfather. There is no history of Colon cancer, Diabetes, or COPD. No Known Allergies Current Outpatient Prescriptions on File Prior to Visit  Medication Sig Dispense Refill  . Adalimumab (HUMIRA PEN Crozier) Inject 1 Syringe into the skin every 14 (fourteen) days.    Marland Kitchen alprazolam (XANAX) 2 MG tablet Take 0.5 tablets (1 mg total) by mouth 2 (two) times daily. 60 tablet 5  . diphenoxylate-atropine (LOMOTIL) 2.5-0.025 MG per tablet Take 1 tablet by mouth 4 (four) times daily as needed for diarrhea or loose stools. 90 tablet 0  . lisinopril-hydrochlorothiazide (PRINZIDE,ZESTORETIC) 20-25 MG per tablet Take 1 tablet by mouth daily. 30 tablet 5  . mirtazapine (REMERON) 15 MG tablet Take 1 tablet (15 mg total) by mouth at bedtime. 30 tablet 5  . PARoxetine (PAXIL) 20 MG tablet Take 2 tablets (40 mg total) by mouth daily. 60 tablet 5  . promethazine (PHENERGAN) 25 MG tablet Take 1 tablet (25 mg total) by mouth every 6 (six) hours as needed for nausea. 10 tablet 0   No current facility-administered medications on file prior to visit.   Review of Systems  All otherwise neg per pt     Objective:   Physical Exam BP 120/88 mmHg  Pulse 84  Temp(Src) 98.3 F (36.8 C) (  Oral)  Wt 243 lb (110.224 kg) VS noted, ill apperaing, in pain Constitutional: Pt appears well-developed, well-nourished./obese  HENT: Head: NCAT.  Right Ear: External ear normal.  Left Ear: External ear normal.  Eyes: . Pupils are equal, round, and reactive to light. Conjunctivae and EOM are normal Neck: Normal range of motion. Neck supple.  Cardiovascular: Normal rate and regular rhythm.   Pulmonary/Chest: Effort normal and breath sounds without rales or wheezing.  Abd:  Soft, ND, decreased BS, mod to severe pain to mild to mod palpation LUQ., without guarding or rebound Neurological: Pt is alert. Not confused , motor  grossly intact Skin: Skin is warm. No rash Psychiatric: Pt behavior is normal. No agitation.     Assessment & Plan:

## 2014-08-08 ENCOUNTER — Encounter: Payer: Self-pay | Admitting: Internal Medicine

## 2014-08-08 ENCOUNTER — Ambulatory Visit (INDEPENDENT_AMBULATORY_CARE_PROVIDER_SITE_OTHER): Payer: Self-pay | Admitting: Internal Medicine

## 2014-08-08 VITALS — BP 110/80 | HR 84 | Temp 97.9°F | Wt 250.0 lb

## 2014-08-08 DIAGNOSIS — I10 Essential (primary) hypertension: Secondary | ICD-10-CM

## 2014-08-08 DIAGNOSIS — F411 Generalized anxiety disorder: Secondary | ICD-10-CM

## 2014-08-08 DIAGNOSIS — R1012 Left upper quadrant pain: Secondary | ICD-10-CM

## 2014-08-08 MED ORDER — ALPRAZOLAM 2 MG PO TABS: 1.0000 mg | ORAL_TABLET | Freq: Two times a day (BID) | ORAL | Status: DC

## 2014-08-08 MED ORDER — OXYCODONE-ACETAMINOPHEN 5-325 MG PO TABS: 1.0000 | ORAL_TABLET | ORAL | Status: DC | PRN

## 2014-08-08 MED ORDER — ONDANSETRON HCL 4 MG PO TABS: 4.0000 mg | ORAL_TABLET | Freq: Four times a day (QID) | ORAL | Status: DC

## 2014-08-08 MED ORDER — METHYLPREDNISOLONE ACETATE 80 MG/ML IJ SUSP
120.0000 mg | Freq: Once | INTRAMUSCULAR | Status: AC
  Administered 2014-08-08: 120 mg via INTRAMUSCULAR

## 2014-08-08 MED ORDER — PREDNISONE 20 MG PO TABS: ORAL_TABLET | ORAL | Status: DC

## 2014-08-08 MED ORDER — PROMETHAZINE HCL 25 MG PO TABS: 25.0000 mg | ORAL_TABLET | Freq: Four times a day (QID) | ORAL | Status: DC | PRN

## 2014-08-08 MED ORDER — OXYCODONE-ACETAMINOPHEN 5-325 MG PO TABS
1.0000 | ORAL_TABLET | ORAL | Status: DC | PRN
Start: 2014-08-08 — End: 2014-08-17

## 2014-08-08 NOTE — Assessment & Plan Note (Signed)
stable overall by history and exam, recent data reviewed with pt, and pt to continue medical treatment as before,  to f/u any worsening symptoms or concerns Lab Results  Component Value Date   WBC 8.4 08/07/2014   HGB 15.5 08/07/2014   HCT 46.1 08/07/2014   PLT 251 08/07/2014   GLUCOSE 97 08/07/2014   ALT 54* 08/07/2014   AST 44* 08/07/2014   NA 134* 08/07/2014   K 3.8 08/07/2014   CL 100 08/07/2014   CREATININE 1.05 08/07/2014   BUN 15 08/07/2014   CO2 26 08/07/2014   TSH 2.42 04/10/2011

## 2014-08-08 NOTE — Discharge Instructions (Signed)
Call for a follow up appointment with a Family or Primary Care Provider for a C. Diff tests. Call for a follow up with your GI specialist.  Return if Symptoms worsen.   Take medication as prescribed.

## 2014-08-08 NOTE — Assessment & Plan Note (Signed)
stable overall by history and exam, recent data reviewed with pt, and pt to continue medical treatment as before,  to f/u any worsening symptoms or concerns BP Readings from Last 3 Encounters:  08/08/14 110/80  08/07/14 120/88  06/30/14 140/90

## 2014-08-08 NOTE — Progress Notes (Signed)
Subjective:    Patient ID: [name] [name], male    DOB: April 22, 1976, 39 y.o.   MRN: [id number]  HPI  Here to f/u ER eval with neg ct and routine labs/cbc; pain/nausea/diarrhea/BRBPR persist without change.  Has not filled the narcotic rx from ER.  Pt denies chest pain, increased sob or doe, wheezing, orthopnea, PND, increased LE swelling, palpitations, dizziness or syncope.  Pt denies new neurological symptoms such as new headache, or facial or extremity weakness or numbness  Denies worsening reflux, dysphagia.  Denies worsening depressive symptoms, suicidal ideation, or panic; has ongoing anxiety, states [facility] told him there are no more xanax refills Past Medical History  Diagnosis Date  . Obstructive sleep apnea (adult) (pediatric)   . Anxiety and depression   . Essential hypertension, benign   . Obesity, unspecified   . Herpes zoster infection 07/2010, 12/2010    with post-herpetic neuralgia  . Vitamin D deficiency   . Crohn's disease   . Hyperlipidemia   . CMV (cytomegalovirus infection) 10/28/2010  . Nephrolithiasis   . Fistula, perirectal   . Sinus infection 08/05/11  . Depression   . Syncope   . Pyelonephritis 2015  . Plantar fasciitis of right foot 08/22/2013  . Tinea pedis 06/27/2013  . Anal fissure - posterior 02/15/2014   Past Surgical History  Procedure Laterality Date  . Small intestine surgery  08/05/07    1 ft removed, ileo-cecectomy  . Hypospadias correction      Childhood   . Upper gastrointestinal endoscopy  10/15/2010    w/biopsy, mild gastritis and duodenitis  . Colonoscopy  10/14/2007    crohn's colitis, aphthous ulcers, mild anal stenosis  . Colon resection    . Tooth extraction      reports that he has never smoked. He has never used smokeless tobacco. He reports that he drinks about 1.8 oz of alcohol per week. He reports that he does not use illicit drugs. family history includes Allergies in his mother and sister; Asthma in his sister; Cancer in his mother;  Gout in his father; Heart disease in his father; Hyperlipidemia in his father; Hypertension in his father; Obesity in his mother; Sleep apnea in his father; Stroke in his maternal grandfather. There is no history of Colon cancer, Diabetes, or COPD. No Known Allergies Current Outpatient Prescriptions on File Prior to Visit  Medication Sig Dispense Refill  . acetaminophen (TYLENOL) 500 MG tablet Take 1,000 mg by mouth every 6 (six) hours as needed for headache.    . Adalimumab 40 MG/0.8ML PNKT Inject 40 mg into the skin every 14 (fourteen) days.    . diphenoxylate-atropine (LOMOTIL) 2.5-0.025 MG per tablet Take 1 tablet by mouth 4 (four) times daily as needed for diarrhea or loose stools. 90 tablet 0  . lisinopril-hydrochlorothiazide (PRINZIDE,ZESTORETIC) 20-25 MG per tablet Take 1 tablet by mouth daily. 30 tablet 5  . mirtazapine (REMERON) 15 MG tablet Take 1 tablet (15 mg total) by mouth at bedtime. 30 tablet 5  . ondansetron (ZOFRAN) 4 MG tablet Take 1 tablet (4 mg total) by mouth every 6 (six) hours. 12 tablet 0  . PARoxetine (PAXIL) 20 MG tablet Take 2 tablets (40 mg total) by mouth daily. 60 tablet 5  . promethazine (PHENERGAN) 25 MG tablet Take 1 tablet (25 mg total) by mouth every 6 (six) hours as needed for nausea or vomiting. 5 tablet 0   No current facility-administered medications on file prior to visit.   Review of Systems  Constitutional: Negative for unusual diaphoresis or other sweats  HENT: Negative for ringing in ear Eyes: Negative for double vision or worsening visual disturbance.  Respiratory: Negative for choking and stridor.   Gastrointestinal: Negative for vomiting or other signifcant bowel change Genitourinary: Negative for hematuria or decreased urine volume.  Musculoskeletal: Negative for other MSK pain or swelling Skin: Negative for color change and worsening wound.  Neurological: Negative for tremors and numbness other than noted  Psychiatric/Behavioral: Negative  for decreased concentration or agitation other than above       Objective:   Physical Exam BP 110/80 mmHg  Pulse 84  Temp(Src) 97.9 F (36.6 C) (Oral)  Wt 250 lb (113.399 kg) VS noted,  Constitutional: Pt appears well-developed, well-nourished.  HENT: Head: NCAT.  Right Ear: External ear normal.  Left Ear: External ear normal.  Eyes: . Pupils are equal, round, and reactive to light. Conjunctivae and EOM are normal Neck: Normal range of motion. Neck supple.  Cardiovascular: Normal rate and regular rhythm.   Pulmonary/Chest: Effort normal and breath sounds without rales or wheezing.  Abd:  Soft, ND, + BS with persistent mod tender to left upper quad Neurological: Pt is alert. Not confused , motor grossly intact Skin: Skin is warm. No rash Psychiatric: Pt behavior is normal. No agitation. mild nervous    Assessment & Plan:

## 2014-08-08 NOTE — Patient Instructions (Addendum)
You had the steroid shot today  Please take all new medication as prescribed - the prednisone, and pain medication as needed  Please continue all other medications as before, and refills have been done if requested.  Please have the pharmacy call with any other refills you may need.  Please keep your appointments with your specialists as you may have planned - GI in April 2016

## 2014-08-08 NOTE — Progress Notes (Signed)
Pre visit review using our clinic review tool, if applicable. No additional management support is needed unless otherwise documented below in the visit note. 

## 2014-08-08 NOTE — Assessment & Plan Note (Signed)
With n/v, diarrhea/hematochezia, unable to see GI until April, afeb, recent cbc with ok wbc, ct neg for acute, pon humira but no obvious source of infection, so likely c/w crohns flare  For depomedorl IM today, prednisone taper, pain control,  to f/u any worsening symptoms or concerns, also for GI in April 2016

## 2014-08-16 ENCOUNTER — Telehealth: Payer: Self-pay | Admitting: Internal Medicine

## 2014-08-16 NOTE — Telephone Encounter (Signed)
ER f/u through My Chart

## 2014-08-17 ENCOUNTER — Telehealth: Payer: Self-pay | Admitting: Internal Medicine

## 2014-08-17 DIAGNOSIS — R197 Diarrhea, unspecified: Secondary | ICD-10-CM

## 2014-08-17 MED ORDER — PREDNISONE 10 MG PO TABS: 40.0000 mg | ORAL_TABLET | Freq: Every day | ORAL | Status: DC

## 2014-08-17 MED ORDER — OXYCODONE-ACETAMINOPHEN 5-325 MG PO TABS: 1.0000 | ORAL_TABLET | ORAL | Status: DC | PRN

## 2014-08-17 NOTE — Telephone Encounter (Signed)
1) prednisone 40 mg daily if he does not have it he needs to have rx 10 mg # 100 2) stool  C diff PCR 3) ok to refill percocet if out 4) Agree with seeing Cecille Rubin on Monday 5) I think he will need to be scoped unless C diff is + 6) tell Cecille Rubin she can call me Monday when she sees him

## 2014-08-17 NOTE — Telephone Encounter (Signed)
Patient notified Orders placed for c-diff Rx placed out front for percocet

## 2014-08-17 NOTE — Telephone Encounter (Signed)
Patient reports terrible abdominal pain and diarrhea.  Recent ER visit .  He reports rectal bleeding with diarrhea 10-12 times a day. He was given an injection of "steroids" last week.  I scheduled him an appt for Monday with Cecille Rubin Hvozdovic, PA at 9:15.  He is out of pain medications.  He has been using imodium every other day, he is advised to take daily if having that many BM.  Dr. Carlean Purl please advise

## 2014-08-17 NOTE — Telephone Encounter (Signed)
Left message for patient to call back  

## 2014-08-20 ENCOUNTER — Ambulatory Visit: Payer: Self-pay | Admitting: Physician Assistant

## 2014-09-03 ENCOUNTER — Ambulatory Visit: Payer: Self-pay | Admitting: Physician Assistant

## 2014-09-11 ENCOUNTER — Encounter (HOSPITAL_COMMUNITY): Payer: Self-pay | Admitting: Emergency Medicine

## 2014-09-11 ENCOUNTER — Emergency Department (HOSPITAL_COMMUNITY)
Admission: EM | Admit: 2014-09-11 | Discharge: 2014-09-12 | Disposition: A | Discharge status: Home / Self Care | Payer: No Typology Code available for payment source | Attending: Emergency Medicine | Admitting: Emergency Medicine

## 2014-09-11 DIAGNOSIS — R1013 Epigastric pain: Secondary | ICD-10-CM | POA: Insufficient documentation

## 2014-09-11 DIAGNOSIS — Z8669 Personal history of other diseases of the nervous system and sense organs: Secondary | ICD-10-CM | POA: Insufficient documentation

## 2014-09-11 DIAGNOSIS — Z8719 Personal history of other diseases of the digestive system: Secondary | ICD-10-CM | POA: Insufficient documentation

## 2014-09-11 DIAGNOSIS — F329 Major depressive disorder, single episode, unspecified: Secondary | ICD-10-CM | POA: Insufficient documentation

## 2014-09-11 DIAGNOSIS — Z7952 Long term (current) use of systemic steroids: Secondary | ICD-10-CM | POA: Diagnosis not present

## 2014-09-11 DIAGNOSIS — R1012 Left upper quadrant pain: Secondary | ICD-10-CM | POA: Insufficient documentation

## 2014-09-11 DIAGNOSIS — R1032 Left lower quadrant pain: Secondary | ICD-10-CM | POA: Diagnosis not present

## 2014-09-11 DIAGNOSIS — Z87442 Personal history of urinary calculi: Secondary | ICD-10-CM | POA: Insufficient documentation

## 2014-09-11 DIAGNOSIS — Z79899 Other long term (current) drug therapy: Secondary | ICD-10-CM | POA: Diagnosis not present

## 2014-09-11 DIAGNOSIS — Z8619 Personal history of other infectious and parasitic diseases: Secondary | ICD-10-CM | POA: Diagnosis not present

## 2014-09-11 DIAGNOSIS — R109 Unspecified abdominal pain: Secondary | ICD-10-CM

## 2014-09-11 DIAGNOSIS — E669 Obesity, unspecified: Secondary | ICD-10-CM | POA: Insufficient documentation

## 2014-09-11 DIAGNOSIS — I10 Essential (primary) hypertension: Secondary | ICD-10-CM | POA: Diagnosis not present

## 2014-09-11 DIAGNOSIS — Z8709 Personal history of other diseases of the respiratory system: Secondary | ICD-10-CM | POA: Diagnosis not present

## 2014-09-11 LAB — CBC WITH DIFFERENTIAL/PLATELET
BASOS ABS: 0 10*3/uL (ref 0.0–0.1)
BASOS PCT: 0 % (ref 0–1)
EOS ABS: 0.1 10*3/uL (ref 0.0–0.7)
EOS PCT: 1 % (ref 0–5)
HCT: 45.2 % (ref 39.0–52.0)
Hemoglobin: 15.1 g/dL (ref 13.0–17.0)
Lymphocytes Relative: 38 % (ref 12–46)
Lymphs Abs: 4.3 10*3/uL — ABNORMAL HIGH (ref 0.7–4.0)
MCH: 31.7 pg (ref 26.0–34.0)
MCHC: 33.4 g/dL (ref 30.0–36.0)
MCV: 95 fL (ref 78.0–100.0)
Monocytes Absolute: 1.5 10*3/uL — ABNORMAL HIGH (ref 0.1–1.0)
Monocytes Relative: 13 % — ABNORMAL HIGH (ref 3–12)
Neutro Abs: 5.6 10*3/uL (ref 1.7–7.7)
Neutrophils Relative %: 48 % (ref 43–77)
PLATELETS: 274 10*3/uL (ref 150–400)
RBC: 4.76 MIL/uL (ref 4.22–5.81)
RDW: 14.7 % (ref 11.5–15.5)
WBC: 11.5 10*3/uL — AB (ref 4.0–10.5)

## 2014-09-11 NOTE — ED Provider Notes (Signed)
CSN: [id number]     Arrival date & time 09/11/14  2318 History  This chart was scribed for non-physician practitioner, Junius Creamer, NP-C working with Rolland Porter, MD, by Chester Holstein, ED Scribe. This patient was seen in room WA11/WA11 and the patient's care was started at 11:34 PM.      Chief Complaint  Patient presents with  . Abdominal Pain     Patient is a 39 y.o. male presenting with abdominal pain. The history is provided by the patient. No language interpreter was used.  Abdominal Pain Associated symptoms: vomiting   Associated symptoms: no chills, no constipation, no diarrhea, no dysuria and no fever    HPI Comments: [name] [name] is a 39 y.o. male who presents to the Emergency Department complaining of 7.5/10 intermittent cramping LUQ abdominal pain with onset 2-3 weeks, worsening today last a couple minutes at a time. Pt states it feels like something is pinching in his abdomen. Pt notes recent cough. Pt notes cough aggravates the pain. Pt with h/o of Crohn's disease. Pt reports vomiting with onset last night. Pt notes he has chronic loose bowels. Pt states he moved apartments around the time of onset. Pt has taken Tylenol and Phenergan for relief. Pt has seen PCP since onset, with C. Diff. done results are negative. Pt denies hematemesis, hematochezia, melena, dysuria, diarrhea, constipation, frequency, fever, and chills.  Past Medical History  Diagnosis Date  . Obstructive sleep apnea (adult) (pediatric)   . Anxiety and depression   . Essential hypertension, benign   . Obesity, unspecified   . Herpes zoster infection 07/2010, 12/2010    with post-herpetic neuralgia  . Vitamin D deficiency   . Crohn's disease   . Hyperlipidemia   . CMV (cytomegalovirus infection) 10/28/2010  . Nephrolithiasis   . Fistula, perirectal   . Sinus infection 08/05/11  . Depression   . Syncope   . Pyelonephritis 2015  . Plantar fasciitis of right foot 08/22/2013  . Tinea pedis 06/27/2013  . Anal  fissure - posterior 02/15/2014   Past Surgical History  Procedure Laterality Date  . Small intestine surgery  08/05/07    1 ft removed, ileo-cecectomy  . Hypospadias correction      Childhood   . Upper gastrointestinal endoscopy  10/15/2010    w/biopsy, mild gastritis and duodenitis  . Colonoscopy  10/14/2007    crohn's colitis, aphthous ulcers, mild anal stenosis  . Colon resection    . Tooth extraction     Family History  Problem Relation Age of Onset  . Heart disease Father     CAD/MI  . Hyperlipidemia Father   . Hypertension Father   . Gout Father   . Sleep apnea Father   . Colon cancer Neg Hx   . Diabetes Neg Hx   . COPD Neg Hx   . Obesity Mother   . Allergies Mother   . Cancer Mother   . Stroke Maternal Grandfather   . Allergies Sister   . Asthma Sister    History  Substance Use Topics  . Smoking status: Never Smoker   . Smokeless tobacco: Never Used  . Alcohol Use: 1.8 oz/week    3 Shots of liquor per week     Comment: 3/week    Review of Systems  Constitutional: Negative for fever and chills.  Gastrointestinal: Positive for vomiting and abdominal pain. Negative for diarrhea, constipation and blood in stool.  Genitourinary: Negative for dysuria and frequency.      Allergies  Review of patient's allergies indicates no known allergies.  Home Medications   Prior to Admission medications   Medication Sig Start Date End Date Taking? Authorizing Provider  acetaminophen (TYLENOL) 500 MG tablet Take 1,000 mg by mouth every 6 (six) hours as needed for headache.   Yes Historical Provider, MD  Adalimumab 40 MG/0.8ML PNKT Inject 40 mg into the skin every 14 (fourteen) days.   Yes Historical Provider, MD  alprazolam Duanne Moron) 2 MG tablet Take 0.5 tablets (1 mg total) by mouth 2 (two) times daily. Patient taking differently: Take 1 mg by mouth 2 (two) times daily as needed (for anxiety).  08/08/14  Yes Biagio Borg, MD  diphenoxylate-atropine (LOMOTIL) 2.5-0.025 MG per  tablet Take 1 tablet by mouth 4 (four) times daily as needed for diarrhea or loose stools. 03/16/14  Yes Gatha Mayer, MD  lisinopril-hydrochlorothiazide (PRINZIDE,ZESTORETIC) 20-25 MG per tablet Take 1 tablet by mouth daily. 05/11/14  Yes Gatha Mayer, MD  mirtazapine (REMERON) 15 MG tablet Take 1 tablet (15 mg total) by mouth at bedtime. 05/11/14  Yes Gatha Mayer, MD  ondansetron (ZOFRAN) 4 MG tablet Take 1 tablet (4 mg total) by mouth every 6 (six) hours. 08/08/14  Yes Harvie Heck, [name]  PARoxetine (PAXIL) 20 MG tablet Take 2 tablets (40 mg total) by mouth daily. 05/11/14  Yes Gatha Mayer, MD  predniSONE (DELTASONE) 10 MG tablet Take 4 tablets (40 mg total) by mouth daily with breakfast. 08/17/14  Yes Gatha Mayer, MD  promethazine (PHENERGAN) 25 MG tablet Take 1 tablet (25 mg total) by mouth every 6 (six) hours as needed for nausea or vomiting. 08/08/14  Yes Harvie Heck, [name]  oxyCODONE-acetaminophen (PERCOCET/ROXICET) 5-325 MG per tablet Take 1 tablet by mouth every 4 (four) hours as needed for moderate pain or severe pain. 09/12/14   Junius Creamer, NP   BP 126/67 mmHg  Pulse 63  Temp(Src) 97.6 F (36.4 C) (Oral)  Resp 18  Ht 5' 9"  (1.753 m)  Wt 230 lb (104.327 kg)  BMI 33.95 kg/m2  SpO2 94% Physical Exam  Constitutional: He is oriented to person, place, and time. He appears well-developed and well-nourished.  HENT:  Head: Normocephalic.  Eyes: Conjunctivae are normal.  Neck: Normal range of motion. Neck supple.  Pulmonary/Chest: Effort normal.  Abdominal: Bowel sounds are decreased. There is tenderness in the epigastric area, left upper quadrant and left lower quadrant.    Musculoskeletal: Normal range of motion.  Neurological: He is alert and oriented to person, place, and time.  Skin: Skin is warm and dry.  Psychiatric: He has a normal mood and affect. His behavior is normal.  Nursing note and vitals reviewed.   ED Course  Procedures (including critical care  time) DIAGNOSTIC STUDIES: Oxygen Saturation is 99% on room air, normal by my interpretation.    COORDINATION OF CARE: 11:40 PM Discussed treatment plan with patient at beside, the patient agrees with the plan and has no further questions at this time.   Labs Review Labs Reviewed  CBC WITH DIFFERENTIAL/PLATELET - Abnormal; Notable for the following:    WBC 11.5 (*)    Lymphs Abs 4.3 (*)    Monocytes Relative 13 (*)    Monocytes Absolute 1.5 (*)    All other components within normal limits  COMPREHENSIVE METABOLIC PANEL - Abnormal; Notable for the following:    Glucose, Bld 133 (*)    GFR calc non Af Amer 73 (*)    GFR calc Af  Amer 84 (*)    All other components within normal limits  LIPASE, BLOOD - Abnormal; Notable for the following:    Lipase 279 (*)    All other components within normal limits  URINALYSIS, ROUTINE W REFLEX MICROSCOPIC    Imaging Review Dg Abd Acute W/chest  09/12/2014   CLINICAL DATA:  Left lower abdominal pain and nausea for 2 days. History of Crohn's disease.  EXAM: ACUTE ABDOMEN SERIES (ABDOMEN 2 VIEW & CHEST 1 VIEW)  COMPARISON:  CT 08/07/2014  FINDINGS: The cardiomediastinal contours are normal. The lungs are clear. There is no free intra-abdominal air. No dilated bowel loops to suggest obstruction. Small volume of stool throughout the colon. Chain sutures noted in the right mid abdomen. No radiopaque calculi. No acute osseous abnormalities are seen.  IMPRESSION: Normal bowel gas pattern.  No free intra-abdominal air.   Electronically Signed   By: Jeb Levering M.D.   On: 09/12/2014 01:44     EKG Interpretation None     patient's labs, x-ray and urine have been reviewed all within normal parameters.  This has been discussed with the patient.  He feels comfortable going home and following up with his gastroenterologist.  He has been given a prescription for Percocet to help control his pain.  He understands that he come back at anytime he develops new or  worsening symptoms  MDM   Final diagnoses:  Abdominal pain        Junius Creamer, NP 09/12/14 [id number]  Rolland Porter, MD 09/12/14 [phone]

## 2014-09-11 NOTE — ED Notes (Signed)
Pt states he has had LUQ pain x several days. Hx of crohn's disease. Alert and oriented.

## 2014-09-12 ENCOUNTER — Emergency Department (HOSPITAL_COMMUNITY): Payer: No Typology Code available for payment source

## 2014-09-12 LAB — COMPREHENSIVE METABOLIC PANEL
ALK PHOS: 78 U/L (ref 39–117)
ALT: 40 U/L (ref 0–53)
ANION GAP: 11 (ref 5–15)
AST: 35 U/L (ref 0–37)
Albumin: 4.4 g/dL (ref 3.5–5.2)
BUN: 15 mg/dL (ref 6–23)
CO2: 26 mmol/L (ref 19–32)
Calcium: 9.2 mg/dL (ref 8.4–10.5)
Chloride: 98 mmol/L (ref 96–112)
Creatinine, Ser: 1.23 mg/dL (ref 0.50–1.35)
GFR, EST AFRICAN AMERICAN: 84 mL/min — AB (ref >90)
GFR, EST NON AFRICAN AMERICAN: 73 mL/min — AB (ref >90)
GLUCOSE: 133 mg/dL — AB (ref 70–99)
POTASSIUM: 4 mmol/L (ref 3.5–5.1)
Sodium: 135 mmol/L (ref 135–145)
Total Bilirubin: 0.6 mg/dL (ref 0.3–1.2)
Total Protein: 8.1 g/dL (ref 6.0–8.3)

## 2014-09-12 LAB — URINALYSIS, ROUTINE W REFLEX MICROSCOPIC
Bilirubin Urine: NEGATIVE
GLUCOSE, UA: NEGATIVE mg/dL
Hgb urine dipstick: NEGATIVE
Ketones, ur: NEGATIVE mg/dL
Leukocytes, UA: NEGATIVE
Nitrite: NEGATIVE
Protein, ur: NEGATIVE mg/dL
SPECIFIC GRAVITY, URINE: 1.013 (ref 1.005–1.030)
Urobilinogen, UA: 0.2 mg/dL (ref 0.0–1.0)
pH: 5.5 (ref 5.0–8.0)

## 2014-09-12 LAB — LIPASE, BLOOD: Lipase: 279 U/L — ABNORMAL HIGH (ref 11–59)

## 2014-09-12 MED ORDER — OXYCODONE-ACETAMINOPHEN 5-325 MG PO TABS: 1.0000 | ORAL_TABLET | ORAL | Status: DC | PRN

## 2014-09-12 MED ORDER — ACETAMINOPHEN 325 MG PO TABS
650.0000 mg | ORAL_TABLET | Freq: Once | ORAL | Status: AC
  Administered 2014-09-12: 650 mg via ORAL
  Filled 2014-09-12: qty 2

## 2014-09-12 NOTE — ED Notes (Signed)
Pt given ginger ale per his request for fluid challenge.

## 2014-09-12 NOTE — ED Notes (Signed)
Pt stated he would like a note/excuse for work today

## 2014-09-12 NOTE — ED Notes (Signed)
Pt tolerated PO challenge, denies nausea at this time. Will continue to monitor.

## 2014-09-12 NOTE — Discharge Instructions (Signed)
Abdominal Pain Many things can cause belly (abdominal) pain. Most times, the belly pain is not dangerous. Many cases of belly pain can be watched and treated at home. HOME CARE   Do not take medicines that help you go poop (laxatives) unless told to by your doctor.  Only take medicine as told by your doctor.  Eat or drink as told by your doctor. Your doctor will tell you if you should be on a special diet. GET HELP IF:  You do not know what is causing your belly pain.  You have belly pain while you are sick to your stomach (nauseous) or have runny poop (diarrhea).  You have pain while you pee or poop.  Your belly pain wakes you up at night.  You have belly pain that gets worse or better when you eat.  You have belly pain that gets worse when you eat fatty foods.  You have a fever. GET HELP RIGHT AWAY IF:   The pain does not go away within 2 hours.  You keep throwing up (vomiting).  The pain changes and is only in the right or left part of the belly.  You have bloody or tarry looking poop. MAKE SURE YOU:   Understand these instructions.  Will watch your condition.  Will get help right away if you are not doing well or get worse. Document Released: 11/25/2007 Document Revised: 06/13/2013 Document Reviewed: 02/15/2013 [facility] Patient Information 2015 [place], [place]. This information is not intended to replace advice given to you by your health care provider. Make sure you discuss any questions you have with your health care provider. At this time, your lab work is all within normal parameters.  Your x-ray shows that he do not have any obstruction or blockage you have a moderate amount of stool throughout the colon but no sign of constipation.  Your urine is normal as well.  U been given a prescription for Percocet for pain control.  Please call Dr. Carlean Purl in the morning to discuss and confer with him the next step in your treatment

## 2014-09-29 ENCOUNTER — Other Ambulatory Visit: Payer: Self-pay | Admitting: Internal Medicine

## 2014-10-02 ENCOUNTER — Ambulatory Visit (INDEPENDENT_AMBULATORY_CARE_PROVIDER_SITE_OTHER): Payer: PRIVATE HEALTH INSURANCE | Admitting: Internal Medicine

## 2014-10-02 ENCOUNTER — Other Ambulatory Visit (INDEPENDENT_AMBULATORY_CARE_PROVIDER_SITE_OTHER): Payer: No Typology Code available for payment source

## 2014-10-02 ENCOUNTER — Encounter: Payer: Self-pay | Admitting: Internal Medicine

## 2014-10-02 VITALS — BP 116/72 | HR 76 | Ht 69.0 in | Wt 246.6 lb

## 2014-10-02 DIAGNOSIS — Z7251 High risk heterosexual behavior: Secondary | ICD-10-CM

## 2014-10-02 DIAGNOSIS — K50819 Crohn's disease of both small and large intestine with unspecified complications: Secondary | ICD-10-CM

## 2014-10-02 DIAGNOSIS — R748 Abnormal levels of other serum enzymes: Secondary | ICD-10-CM

## 2014-10-02 LAB — LIPASE: Lipase: 70 U/L — ABNORMAL HIGH (ref 11.0–59.0)

## 2014-10-02 LAB — AMYLASE: AMYLASE: 58 U/L (ref 27–131)

## 2014-10-02 LAB — HIV ANTIBODY (ROUTINE TESTING W REFLEX): HIV 1&2 Ab, 4th Generation: NONREACTIVE

## 2014-10-02 MED ORDER — EMTRICITABINE-TENOFOVIR DF 200-300 MG PO TABS: 1.0000 | ORAL_TABLET | Freq: Every day | ORAL | Status: DC

## 2014-10-02 MED ORDER — COLESTIPOL HCL 1 G PO TABS: 4.0000 g | ORAL_TABLET | Freq: Every day | ORAL | Status: DC

## 2014-10-02 NOTE — Assessment & Plan Note (Signed)
Better but stillsxs

## 2014-10-02 NOTE — Patient Instructions (Addendum)
Your physician has requested that you go to the basement for lab work before leaving today.   We have sent the following medications to your pharmacy for you to pick up at your convenience: Colestipol  Follow up with [place] in 2 months.  Will call back and set up   I appreciate the opportunity to care for you.

## 2014-10-02 NOTE — Progress Notes (Signed)
   Subjective:    Patient ID: [name] [name], male    DOB: Jan 01, 1976, 39 y.o.   MRN: [id number] Chief complaint: Left upper quadrant pain, follow-up of Crohn's HPI [name] [name] is here after a few month absence or so. He's been in the emergency room and is seen primary care. He continues to have diarrhea about 6 stools a day and has the time goes on closer was next Humira injection may have more frequent stools. Abdominal pain is better he is not having nausea or vomiting. He is not describing bleeding or fever and is not having any rectal pain or problems there. He does take 1 Lomotil daily. He is asking me about a potential study for intolerance to antitumor necrosis factor Biologics. He is interested perhaps.  His lipase is elevated for unclear reasons  He would also like to renew his Truvada since his partner is HIV positive. He has not had anal receptive intercourse in a long time.  He is back to work at a [employer] and is on [employer] and is pleased about that. Medications, allergies, past medical history, past surgical history, family history and social history are reviewed and updated in the EMR.  Review of Systems As per history of present illness    Objective:   Physical Exam @BP  116/72 mmHg  Pulse 76  Ht 5' 9"  (1.753 m)  Wt 246 lb 9.6 oz (111.857 kg)  BMI 36.40 kg/m2@  General:  Well-developed, well-nourished and in no acute distress, obese Eyes:  anicteric. Lungs: Clear to auscultation bilaterally. Heart:  S1S2, no rubs, murmurs, gallops. Abdomen:  soft, mildly tender left upper quadrant, no hepatosplenomegaly, no mass BS+.  Extremities:   no edema, cyanosis or clubbing Skin   no rash. Neuro:  A&O x 3.  Psych:  appropriate mood and  Affect.   Data Reviewed: PCP notes, ER notes, labs, imaging all from early 2016 and late 2015     Assessment & Plan:   1. Regional enteritis of small intestine with large intestine, unspecified complication   2.  Abnormal serum lipase level   3. High risk sexual behavior    I think his diarrhea could be bile salt malabsorption since she's had a right hemicolectomy and has Crohn's disease. I have struggled to find activity of his Crohn's with objective testing.  Will try colestipol 4 g daily.  I am also checking adalimumab level and antibodies, he is due for a dose in 2 days so I think this is close enough to a trough level.  Will recheck amylase and lipase.  He is asked to be screened for HIV and we'll do so and I have renewed Truvada or protection against HIV infection given his positive partner which he was on before.  I plan to see him back in 2 months routinely sooner if needed Further plans pending the above workup and response to treatment

## 2014-10-03 NOTE — Progress Notes (Signed)
Quick Note:  See My Chart ______

## 2014-10-04 ENCOUNTER — Other Ambulatory Visit: Payer: Self-pay | Admitting: Family

## 2014-10-04 ENCOUNTER — Telehealth: Payer: Self-pay | Admitting: Internal Medicine

## 2014-10-04 ENCOUNTER — Encounter: Payer: Self-pay | Admitting: Internal Medicine

## 2014-10-04 ENCOUNTER — Ambulatory Visit (INDEPENDENT_AMBULATORY_CARE_PROVIDER_SITE_OTHER): Payer: No Typology Code available for payment source | Admitting: Internal Medicine

## 2014-10-04 ENCOUNTER — Telehealth: Payer: Self-pay

## 2014-10-04 VITALS — BP 124/80 | HR 72 | Temp 98.4°F | Resp 18 | Ht 69.0 in | Wt 239.0 lb

## 2014-10-04 DIAGNOSIS — R1012 Left upper quadrant pain: Secondary | ICD-10-CM | POA: Diagnosis not present

## 2014-10-04 DIAGNOSIS — I10 Essential (primary) hypertension: Secondary | ICD-10-CM | POA: Diagnosis not present

## 2014-10-04 DIAGNOSIS — K50819 Crohn's disease of both small and large intestine with unspecified complications: Secondary | ICD-10-CM | POA: Diagnosis not present

## 2014-10-04 MED ORDER — COLESTIPOL HCL 1 G PO TABS: 4.0000 g | ORAL_TABLET | Freq: Every day | ORAL | Status: DC

## 2014-10-04 MED ORDER — LISINOPRIL-HYDROCHLOROTHIAZIDE 20-25 MG PO TABS: 1.0000 | ORAL_TABLET | Freq: Every day | ORAL | Status: DC

## 2014-10-04 MED ORDER — PAROXETINE HCL 20 MG PO TABS: 40.0000 mg | ORAL_TABLET | Freq: Every day | ORAL | Status: DC

## 2014-10-04 MED ORDER — ONDANSETRON HCL 4 MG/2ML IJ SOLN
4.0000 mg | Freq: Once | INTRAMUSCULAR | Status: AC
  Administered 2014-10-04: 4 mg via INTRAMUSCULAR

## 2014-10-04 MED ORDER — METRONIDAZOLE 250 MG PO TABS: 250.0000 mg | ORAL_TABLET | Freq: Three times a day (TID) | ORAL | Status: DC

## 2014-10-04 MED ORDER — ONDANSETRON HCL 4 MG PO TABS: 4.0000 mg | ORAL_TABLET | Freq: Four times a day (QID) | ORAL | Status: DC

## 2014-10-04 MED ORDER — MIRTAZAPINE 15 MG PO TABS: 15.0000 mg | ORAL_TABLET | Freq: Every day | ORAL | Status: DC

## 2014-10-04 NOTE — Addendum Note (Signed)
Addended by: Biagio Borg on: 10/04/2014 04:08 PM   Modules accepted: Orders

## 2014-10-04 NOTE — Progress Notes (Signed)
Subjective:    Patient ID: [name] [name], male    DOB: 1976-06-21, 39 y.o.   MRN: [id number]  HPI Here with fever and mild worsening left sided abd pain, some mild diarrheal stools without blood x 2 days.  Has just seen GI but symptoms started after.  Has hx of c diff.  +n/v several times without blood, hard to keep any pills or fluids down today.  No chills, radiation of pain, dysphagia, other bowel change.  Mult co-workers sick with resp illness .  Pt has mild ST, not clear if related, but no cough and Pt denies chest pain, increased sob or doe, wheezing, orthopnea, PND, increased LE swelling, palpitations, dizziness or syncope.  Pt denies new neurological symptoms such as new headache, or facial or extremity weakness or numbness   Pt denies polydipsia, polyuria,   Phenergan not working well, just puts him to sleep Past Medical History  Diagnosis Date  . Obstructive sleep apnea (adult) (pediatric)   . Anxiety and depression   . Essential hypertension, benign   . Obesity, unspecified   . Herpes zoster infection 07/2010, 12/2010    with post-herpetic neuralgia  . Vitamin D deficiency   . Crohn's disease   . Hyperlipidemia   . CMV (cytomegalovirus infection) 10/28/2010  . Nephrolithiasis   . Fistula, perirectal   . Sinus infection 08/05/11  . Depression   . Syncope   . Pyelonephritis 2015  . Plantar fasciitis of right foot 08/22/2013  . Tinea pedis 06/27/2013  . Anal fissure - posterior 02/15/2014   Past Surgical History  Procedure Laterality Date  . Small intestine surgery  08/05/07    1 ft removed, ileo-cecectomy  . Hypospadias correction      Childhood   . Upper gastrointestinal endoscopy  10/15/2010    w/biopsy, mild gastritis and duodenitis  . Colonoscopy  10/14/2007    crohn's colitis, aphthous ulcers, mild anal stenosis  . Colon resection    . Tooth extraction      reports that he has never smoked. He has never used smokeless tobacco. He reports that he drinks about 1.8 oz of  alcohol per week. He reports that he does not use illicit drugs. family history includes Allergies in his mother and sister; Asthma in his sister; Cancer in his mother; Gout in his father; Heart disease in his father; Hyperlipidemia in his father; Hypertension in his father; Obesity in his mother; Sleep apnea in his father; Stroke in his maternal grandfather. There is no history of Colon cancer, Diabetes, or COPD. No Known Allergies   Review of Systems  Constitutional: Negative for unusual diaphoresis or night sweats HENT: Negative for ringing in ear or discharge Eyes: Negative for double vision or worsening visual disturbance.  Respiratory: Negative for choking and stridor.   Gastrointestinal: Negative for vomiting or other signifcant bowel change Genitourinary: Negative for hematuria or change in urine volume.  Musculoskeletal: Negative for other MSK pain or swelling Skin: Negative for color change and worsening wound.  Neurological: Negative for tremors and numbness other than noted  Psychiatric/Behavioral: Negative for decreased concentration or agitation other than above       Objective:   Physical Exam BP 124/80 mmHg  Pulse 72  Temp(Src) 98.4 F (36.9 C) (Oral)  Resp 18  Ht 5' 9"  (1.753 m)  Wt 239 lb (108.41 kg)  BMI 35.28 kg/m2  SpO2 97% VS noted, mild ill appearing Constitutional: Pt appears in no significant distress HENT: Head: NCAT.  Right  Ear: External ear normal.  Left Ear: External ear normal.  Eyes: . Pupils are equal, round, and reactive to light. Conjunctivae and EOM are normal Neck: Normal range of motion. Neck supple.  Cardiovascular: Normal rate and regular rhythm.   Pulmonary/Chest: Effort normal and breath sounds without rales or wheezing.  Abd:  Soft,  ND, + BS with mild left sided abd tender upper and lower than "usual" for him, without guarding or rebound Neurological: Pt is alert. Not confused , motor grossly intact Skin: Skin is warm. No rash, no LE  edema Psychiatric: Pt behavior is normal. No agitation.     Assessment & Plan:

## 2014-10-04 NOTE — Addendum Note (Signed)
Addended by: Valerie Salts on: 10/04/2014 04:51 PM   Modules accepted: Orders

## 2014-10-04 NOTE — Progress Notes (Signed)
Pre visit review using our clinic review tool, if applicable. No additional management support is needed unless otherwise documented below in the visit note. 

## 2014-10-04 NOTE — Telephone Encounter (Signed)
Pt called after normal office hours and stated his pharmacy did not receive refill requests for Remeron & Xanax.  I spoke to [place] and she stated she would address this with Dr. Jenny Reichmann.  Per Dr. Jenny Reichmann, pt was seen today for other reasons and these scripts were not requested at time of service.  Dr. Jenny Reichmann will print these refills in the morning and pt can come in and pick them up after 9 am since Dr. Jenny Reichmann has had printer issues this evening.

## 2014-10-04 NOTE — Assessment & Plan Note (Addendum)
O/w stable per GI exam recently, no overt bleeding,  to f/u any worsening symptoms or concerns

## 2014-10-04 NOTE — Assessment & Plan Note (Signed)
?   Mild worsening with diarrhea, differential includes viral AGE,m has hx of crohns as well as cdiff,  no blood currently and + fever, will check stools for c diff/stool culture, tx empiric with flagyl, for zofran IM in office, and prn po for home, sips fluds frequent, to ER for any worsening

## 2014-10-04 NOTE — Telephone Encounter (Signed)
Now it appears he needs the Remeron as well. The prescriptions may have gotten lost in the printer jam we had earlier.

## 2014-10-04 NOTE — Addendum Note (Signed)
Addended by: Valerie Salts on: 10/04/2014 04:06 PM   Modules accepted: Orders

## 2014-10-04 NOTE — Telephone Encounter (Signed)
Patient would like a refill on his xanax. He also wanted a refill on his Remeron, but that was sent over to [facility] already.

## 2014-10-04 NOTE — Assessment & Plan Note (Signed)
stable overall by history and exam, recent data reviewed with pt, and pt to continue medical treatment as before,  to f/u any worsening symptoms or concerns BP Readings from Last 3 Encounters:  10/04/14 124/80  10/02/14 116/72  09/12/14 115/72

## 2014-10-04 NOTE — Patient Instructions (Signed)
You had the zofran shot in the office today  Please take all new medication as prescribed - the pill zofran for nausea at home, as well as the generic flagyl  Please continue all other medications as before, and refills have been done if requested.  Please have the pharmacy call with any other refills you may need.  Please keep your appointments with your specialists as you may have planned  Please go to the LAB in the Basement (turn left off the elevator) for the tests to be done today   You will be contacted by phone if any changes need to be made immediately.  Otherwise, you will receive a letter about your results with an explanation, but please check with MyChart first.  Please remember to sign up for MyChart if you have not done so, as this will be important to you in the future with finding out test results, communicating by private email, and scheduling acute appointments online when needed.

## 2014-10-05 ENCOUNTER — Telehealth: Payer: Self-pay | Admitting: Internal Medicine

## 2014-10-05 MED ORDER — ALPRAZOLAM 2 MG PO TABS: 1.0000 mg | ORAL_TABLET | Freq: Two times a day (BID) | ORAL | Status: DC

## 2014-10-05 MED ORDER — MIRTAZAPINE 15 MG PO TABS: 15.0000 mg | ORAL_TABLET | Freq: Every day | ORAL | Status: DC

## 2014-10-05 NOTE — Addendum Note (Signed)
Addended by: Biagio Borg on: 10/05/2014 08:31 AM   Modules accepted: Orders

## 2014-10-05 NOTE — Telephone Encounter (Addendum)
Xanax too soon if staying at same pharmacy  I understand pt may be trying to change pharmacies from [facility] to [facility] [address], and controlled sub refills do not transfer from one pharmacy to next  OK for cherina to verify last pt xanax fill at [facility], and cancel all other refills pending if pt is changing pharmacies, then let me know if new rx for xanax is needed for [facility]  remeron ok - done erx

## 2014-10-05 NOTE — Telephone Encounter (Signed)
Done hardcopy to Cherina  

## 2014-10-07 ENCOUNTER — Encounter: Payer: Self-pay | Admitting: Internal Medicine

## 2014-10-08 ENCOUNTER — Telehealth: Payer: Self-pay | Admitting: Internal Medicine

## 2014-10-08 NOTE — Telephone Encounter (Signed)
Notified pt  With md response...[name] [name]

## 2014-10-08 NOTE — Telephone Encounter (Signed)
I would not feel comfortable with ongoing prescriptions of this pain medication which is a narcotic controlled substance.  If abd pain persists, pt should contact Dr Celesta Aver office for consideration of further eval and tx, as was indicated at last visit apr 12 with Dr Carlean Purl

## 2014-10-08 NOTE — Telephone Encounter (Signed)
MD out of office will hold until he return tomorrow...Johny Chess

## 2014-10-08 NOTE — Telephone Encounter (Signed)
Patient needs refill for oxyCODONE-acetaminophen (PERCOCET/ROXICET) 5-325 MG per tablet [[id number]] This is a follow up call from an email he sent through [facility]

## 2014-10-09 ENCOUNTER — Encounter: Payer: Self-pay | Admitting: Internal Medicine

## 2014-10-10 ENCOUNTER — Telehealth: Payer: Self-pay | Admitting: Internal Medicine

## 2014-10-11 ENCOUNTER — Other Ambulatory Visit: Payer: Self-pay | Admitting: Internal Medicine

## 2014-10-11 DIAGNOSIS — R1012 Left upper quadrant pain: Secondary | ICD-10-CM

## 2014-10-11 DIAGNOSIS — K50819 Crohn's disease of both small and large intestine with unspecified complications: Secondary | ICD-10-CM

## 2014-10-11 LAB — ADALIMUMAB+AB (SERIAL MONITOR)
Adalimumab Drug Level: 2.4 ug/mL
Anti-Adalimumab Antibody: <25 ng/mL

## 2014-10-11 MED ORDER — OXYCODONE-ACETAMINOPHEN 5-325 MG PO TABS: 1.0000 | ORAL_TABLET | ORAL | Status: DC | PRN

## 2014-10-11 NOTE — Telephone Encounter (Signed)
See my chart message from 10/09/14. Dr. Carlean Purl contacted patient through my chart.

## 2014-11-02 ENCOUNTER — Encounter: Payer: Self-pay | Admitting: Internal Medicine

## 2014-11-02 ENCOUNTER — Ambulatory Visit (INDEPENDENT_AMBULATORY_CARE_PROVIDER_SITE_OTHER): Payer: No Typology Code available for payment source

## 2014-11-02 ENCOUNTER — Ambulatory Visit (INDEPENDENT_AMBULATORY_CARE_PROVIDER_SITE_OTHER): Payer: No Typology Code available for payment source | Admitting: Family Medicine

## 2014-11-02 ENCOUNTER — Ambulatory Visit (INDEPENDENT_AMBULATORY_CARE_PROVIDER_SITE_OTHER)
Admission: RE | Admit: 2014-11-02 | Discharge: 2014-11-02 | Disposition: A | Discharge status: Home / Self Care | Payer: No Typology Code available for payment source | Source: Ambulatory Visit | Attending: Family Medicine | Admitting: Family Medicine

## 2014-11-02 ENCOUNTER — Encounter: Payer: Self-pay | Admitting: Family Medicine

## 2014-11-02 ENCOUNTER — Ambulatory Visit (INDEPENDENT_AMBULATORY_CARE_PROVIDER_SITE_OTHER): Payer: No Typology Code available for payment source | Admitting: Internal Medicine

## 2014-11-02 ENCOUNTER — Encounter: Payer: Self-pay | Admitting: *Deleted

## 2014-11-02 VITALS — BP 118/74 | HR 95 | Ht 69.0 in | Wt 243.0 lb

## 2014-11-02 VITALS — BP 118/74 | HR 88 | Temp 98.0°F | Ht 69.0 in | Wt 243.5 lb

## 2014-11-02 DIAGNOSIS — M25531 Pain in right wrist: Secondary | ICD-10-CM | POA: Diagnosis not present

## 2014-11-02 DIAGNOSIS — M6740 Ganglion, unspecified site: Secondary | ICD-10-CM | POA: Insufficient documentation

## 2014-11-02 MED ORDER — NAPROXEN 500 MG PO TABS: 500.0000 mg | ORAL_TABLET | Freq: Two times a day (BID) | ORAL | Status: DC

## 2014-11-02 NOTE — Addendum Note (Signed)
Addended by: Douglass Rivers T on: 11/02/2014 12:28 PM   Modules accepted: Orders

## 2014-11-02 NOTE — Patient Instructions (Signed)
Good to see you Xray today .Wear brace day and night for 1 week then nightly for another week pennsaid pinkie amount topically 2 times daily as needed.  Ice 20 minutes 2 times daily. Usually after activity and before bed. See me again in 2 weeks.

## 2014-11-02 NOTE — Progress Notes (Signed)
Pre visit review using our clinic review tool, if applicable. No additional management support is needed unless otherwise documented below in the visit note. 

## 2014-11-02 NOTE — Progress Notes (Signed)
[facility] [address], [place] [address] Phone: [phone] Subjective:    I'm seeing this patient by the request  of:  Cathlean Cower, MD   CC: Right thumb pain  [id number] [name] [name] is a 39 y.o. male coming in with complaint of right thumb pain. Patient states for the last week he is having increasing pain of the wrist. Patient states that it is hard for him to move certain directions. Patient states when he moves the thumb and can be more painful. Patient denies any numbness or tingling and denies any type of injury. Patient rates the severity of pain a 7 out of 10. Denies any numbness.  Describes it as a sharp pain    Past medical history, social, surgical and family history all reviewed in electronic medical record.   Review of Systems: No headache, visual changes, nausea, vomiting, diarrhea, constipation, dizziness, abdominal pain, skin rash, fevers, chills, night sweats, weight loss, swollen lymph nodes, body aches, joint swelling, muscle aches, chest pain, shortness of breath, mood changes.   Objective Blood pressure 118/74, pulse 95, height 5' 9"  (1.753 m), weight 243 lb (110.224 kg).  General: No apparent distress alert and oriented x3 mood and affect normal, dressed appropriately.  HEENT: Pupils equal, extraocular movements intact  Respiratory: Patient's speak in full sentences and does not appear short of breath  Cardiovascular: No lower extremity edema, non tender, no erythema  Skin: Warm dry intact with no signs of infection or rash on extremities or on axial skeleton.  Abdomen: Soft nontender  Neuro: Cranial nerves II through XII are intact, neurovascularly intact in all extremities with 2+ DTRs and 2+ pulses.  Lymph: No lymphadenopathy of posterior or anterior cervical chain or axillae bilaterally.  Gait normal with good balance and coordination.  MSK:  Non tender with full range of motion and good stability and symmetric  strength and tone of shoulders, elbows,  hip, knee and ankles bilaterally.  Wrist: Right Inspection normal with no visible erythema or swelling. ROM smooth and normal with good flexion and extension and ulnar/radial deviation that is symmetrical with opposite wrist. Tender over the palmar aspect near the palmar longus tendon No snuffbox tenderness. No tenderness over Canal of Guyon. Strength 5/5 in all directions without pain. Positive Finkelstein's negative Tinel's Negative Watson's test.  Contralateral wrist unremarkable   MSK US performed of: Right wrist This study was ordered, performed, and interpreted by Charlann Boxer D.O.  Wrist: All extensor compartments visualized and tendons all normal and does have some hypoechoic changes of the palmar longus tendon. Patient also has what appears to be a ganglion cyst or next to the radial artery TFCC intact. Scapholunate ligament intact. unable to see the scaphoid bone secondary to hypoechoic changes.  Carpal tunnel visualized and median nerve area normal, flexor tendons all normal in appearance without fraying, tears, or sheath effusions. Power doppler signal normal.  IMPRESSION de Quervain's tenosynovitis with ganglion cyst  Procedure: Real-time Ultrasound Guided Injection of right extensor pollicis longus tendon sheath ganglion cyst Device: GE Logiq E  Ultrasound guided injection is preferred based studies that show increased duration, increased effect, greater accuracy, decreased procedural pain, increased response rate, and decreased cost with ultrasound guided versus blind injection.  Verbal informed consent obtained.  Time-out conducted.  Noted no overlying erythema, induration, or other signs of local infection.  Skin prepped in a sterile fashion.  Local anesthesia: Topical Ethyl chloride.  With sterile technique and under real  time ultrasound guidance: With a 22-gauge 2 inch needle patient was injected with 0.5 mL of 0.5% Marcaine.  Patient hasn't had some mild aspiration of the ganglion cyst. Patient then had 0.5 mL of 40 mg /dL of Kenalog. Completed without difficulty  Pain immediately resolved suggesting accurate placement of the medication.  Advised to call if fevers/chills, erythema, induration, drainage, or persistent bleeding.  Images permanently stored and available for review in the ultrasound unit.  Impression: Technically successful ultrasound guided injection.  t  Impression and Recommendations:     This case required medical decision making of moderate complexity.

## 2014-11-02 NOTE — Progress Notes (Signed)
Subjective:    Patient ID: [name] [name], male    DOB: 07-26-1975, 39 y.o.   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   Past Medical History  Diagnosis Date  . Obstructive sleep apnea (adult) (pediatric)   . Anxiety and depression   . Essential hypertension, benign   . Obesity, unspecified   . Herpes zoster infection 07/2010, 12/2010    with post-herpetic neuralgia  . Vitamin D deficiency   . Crohn's disease   . Hyperlipidemia   . CMV (cytomegalovirus infection) 10/28/2010  . Nephrolithiasis   . Fistula, perirectal   . Sinus infection 08/05/11  . Depression   . Syncope   . Pyelonephritis 2015  . Plantar fasciitis of right foot 08/22/2013  . Tinea pedis 06/27/2013  . Anal fissure - posterior 02/15/2014   Past Surgical History  Procedure Laterality Date  . Small intestine surgery  08/05/07    1 ft removed, ileo-cecectomy  . Hypospadias correction      Childhood   . Upper gastrointestinal endoscopy  10/15/2010    w/biopsy, mild gastritis and duodenitis  . Colonoscopy  10/14/2007    crohn's colitis, aphthous ulcers, mild anal stenosis  . Colon resection    . Tooth extraction      reports that he has never smoked. He has never used smokeless tobacco. He reports that he drinks about 1.8 oz of alcohol per week. He reports that he does not use illicit drugs. family history includes Allergies in his mother and sister; Asthma in his sister; Cancer in his mother; Gout in his father; Heart disease in his father; Hyperlipidemia in his father; Hypertension in his father; Obesity in his mother; Sleep apnea in his father; Stroke in his maternal grandfather. There is no history of Colon cancer, Diabetes, or COPD. No Known Allergies Current Outpatient Prescriptions on File Prior to Visit  Medication Sig  Dispense Refill  . acetaminophen (TYLENOL) 500 MG tablet Take 1,000 mg by mouth every 6 (six) hours as needed for headache.    . Adalimumab 40 MG/0.8ML PNKT Inject 40 mg into the skin every 14 (fourteen) days.    Marland Kitchen alprazolam (XANAX) 2 MG tablet Take 0.5 tablets (1 mg total) by mouth 2 (two) times daily. 30 tablet 5  . colestipol (COLESTID) 1 G tablet Take 4 tablets (4 g total) by mouth daily with supper. 120 tablet 3  . diphenoxylate-atropine (LOMOTIL) 2.5-0.025 MG per tablet Take 1 tablet by mouth 4 (four) times daily as needed for diarrhea or loose stools. 90 tablet 0  . emtricitabine-tenofovir (TRUVADA) 200-300 MG per tablet Take 1 tablet by mouth daily. 90 tablet 3  . lisinopril-hydrochlorothiazide (PRINZIDE,ZESTORETIC) 20-25 MG per tablet Take 1 tablet by mouth daily. 30 tablet 5  . mirtazapine (REMERON) 15 MG tablet Take 1 tablet (15 mg total) by mouth at bedtime. 30 tablet 5  . PARoxetine (PAXIL) 20 MG tablet Take 2 tablets (40 mg total) by mouth daily. 60 tablet 5   No current facility-administered medications on file prior to visit.   Review of Systems All otherwise neg per pt     Objective:   Physical Exam BP 118/74 mmHg  Pulse 88  Temp(Src) 98 F (36.7 C) (Oral)  Ht 5' 9"  (1.753 m)  Wt 243 lb 8  oz (110.451 kg)  BMI 35.94 kg/m2  SpO2 95% VS noted,  Constitutional: Pt appears in no significant distress HENT: Head: NCAT.  Right Ear: External ear normal.  Left Ear: External ear normal.  Eyes: . Pupils are equal, round, and reactive to light. Conjunctivae and EOM are normal Neck: Normal range of motion. Neck supple.  Cardiovascular: Normal rate and regular rhythm.   Pulmonary/Chest: Effort normal and breath sounds without rales or wheezing.  Abd:  Soft, NT, ND, + BS Neurological: Pt is alert. Not confused , motor grossly intact Skin: Skin is warm. No rash, no LE edema Psychiatric: Pt behavior is normal. No agitation.  Right wrist without effusion or decreased  ROM Tender right first dorsal compartment mod to severe, with swelling and warmth all from CMP to approx 2 cm prox to the wrist    Assessment & Plan:

## 2014-11-02 NOTE — Patient Instructions (Signed)
Please take all new medication as prescribed - the anti-inflammatory  Please continue all other medications as before, and refills have been done if requested.  Please have the pharmacy call with any other refills you may need.  Please keep your appointments with your specialists as you may have planned  Please see Dr Smith/sports medicine today if possible, for steroid injection

## 2014-11-02 NOTE — Assessment & Plan Note (Signed)
Appears to be related to a first dorsal compartment tendonitis, cant r/o ganglioin cyst flare, for naproxyn bid prn, also refer Dr Smith/sport medicine for u/s and possible steroid injection

## 2014-11-02 NOTE — Assessment & Plan Note (Signed)
Patient did have an injection into the tendon sheath as well as some mild aspiration of the cyst. Patient though did have a vasovagal response. We had to stop the aspiration. Patient did respond well though with decreased pain immediately. Patient was put in a thumb spica splint, we discussed slowly progressing out of that over the course of time. We discussed icing regimen and given to trial of anti-inflammatories. Patient will come back in 2-3 weeks for further evaluation. X-rays ordered today as well.

## 2014-11-05 ENCOUNTER — Telehealth: Payer: Self-pay | Admitting: *Deleted

## 2014-11-05 NOTE — Telephone Encounter (Signed)
Can do 5 days of prednisone and/or MRI His preference.

## 2014-11-05 NOTE — Telephone Encounter (Signed)
Spoke to pt, he stated he would like to try prednisone first.

## 2014-11-05 NOTE — Telephone Encounter (Signed)
Pt called stating that he is having pain in his thumb, more than before. He's been wearing his brace as much as he can & icing but the pain is getting worse.

## 2014-11-07 ENCOUNTER — Encounter: Payer: Self-pay | Admitting: Family Medicine

## 2014-11-07 DIAGNOSIS — M25531 Pain in right wrist: Secondary | ICD-10-CM

## 2014-11-07 MED ORDER — GABAPENTIN 100 MG PO CAPS: 100.0000 mg | ORAL_CAPSULE | Freq: Every day | ORAL | Status: DC

## 2014-11-09 ENCOUNTER — Encounter: Payer: Self-pay | Admitting: Family Medicine

## 2014-11-09 NOTE — Addendum Note (Signed)
Addended by: Douglass Rivers T on: 11/09/2014 11:48 AM   Modules accepted: Orders

## 2014-11-11 ENCOUNTER — Emergency Department (HOSPITAL_COMMUNITY)
Admission: EM | Admit: 2014-11-11 | Discharge: 2014-11-11 | Disposition: A | Discharge status: Home / Self Care | Payer: No Typology Code available for payment source | Attending: Emergency Medicine | Admitting: Emergency Medicine

## 2014-11-11 ENCOUNTER — Encounter (HOSPITAL_COMMUNITY): Payer: Self-pay | Admitting: Emergency Medicine

## 2014-11-11 DIAGNOSIS — Z79899 Other long term (current) drug therapy: Secondary | ICD-10-CM | POA: Diagnosis not present

## 2014-11-11 DIAGNOSIS — Z791 Long term (current) use of non-steroidal anti-inflammatories (NSAID): Secondary | ICD-10-CM | POA: Diagnosis not present

## 2014-11-11 DIAGNOSIS — F329 Major depressive disorder, single episode, unspecified: Secondary | ICD-10-CM | POA: Insufficient documentation

## 2014-11-11 DIAGNOSIS — Z8619 Personal history of other infectious and parasitic diseases: Secondary | ICD-10-CM | POA: Insufficient documentation

## 2014-11-11 DIAGNOSIS — Z87448 Personal history of other diseases of urinary system: Secondary | ICD-10-CM | POA: Diagnosis not present

## 2014-11-11 DIAGNOSIS — M25531 Pain in right wrist: Secondary | ICD-10-CM | POA: Diagnosis present

## 2014-11-11 DIAGNOSIS — E669 Obesity, unspecified: Secondary | ICD-10-CM | POA: Diagnosis not present

## 2014-11-11 DIAGNOSIS — I10 Essential (primary) hypertension: Secondary | ICD-10-CM | POA: Insufficient documentation

## 2014-11-11 DIAGNOSIS — F419 Anxiety disorder, unspecified: Secondary | ICD-10-CM | POA: Diagnosis not present

## 2014-11-11 DIAGNOSIS — Z87442 Personal history of urinary calculi: Secondary | ICD-10-CM | POA: Diagnosis not present

## 2014-11-11 DIAGNOSIS — Z8719 Personal history of other diseases of the digestive system: Secondary | ICD-10-CM | POA: Diagnosis not present

## 2014-11-11 MED ORDER — HYDROCODONE-ACETAMINOPHEN 5-325 MG PO TABS: 1.0000 | ORAL_TABLET | ORAL | Status: DC | PRN

## 2014-11-11 NOTE — ED Notes (Signed)
Pt alert, oriented, and ambulatory upon DC. He was advised to follow up with PCP in one week for further pain management. He verbalizes understanding.

## 2014-11-11 NOTE — ED Notes (Signed)
Pt arrived other ED with a complaint of a cyst on his right wrist.  A week and a half ago pt had the cyst aspirated by [facility].  Pt states he has had wrist in a splint since and was suppose to take it off but the pain persisted.

## 2014-11-11 NOTE — Discharge Instructions (Signed)
Cryotherapy Cryotherapy means treatment with cold. Ice or gel packs can be used to reduce both pain and swelling. Ice is the most helpful within the first 24 to 48 hours after an injury or flare-up from overusing a muscle or joint. Sprains, strains, spasms, burning pain, shooting pain, and aches can all be eased with ice. Ice can also be used when recovering from surgery. Ice is effective, has very few side effects, and is safe for most people to use. PRECAUTIONS  Ice is not a safe treatment option for people with:  Raynaud phenomenon. This is a condition affecting small blood vessels in the extremities. Exposure to cold may cause your problems to return.  Cold hypersensitivity. There are many forms of cold hypersensitivity, including:  Cold urticaria. Red, itchy hives appear on the skin when the tissues begin to warm after being iced.  Cold erythema. This is a red, itchy rash caused by exposure to cold.  Cold hemoglobinuria. Red blood cells break down when the tissues begin to warm after being iced. The hemoglobin that carry oxygen are passed into the urine because they cannot combine with blood proteins fast enough.  Numbness or altered sensitivity in the area being iced. If you have any of the following conditions, do not use ice until you have discussed cryotherapy with your caregiver:  Heart conditions, such as arrhythmia, angina, or chronic heart disease.  High blood pressure.  Healing wounds or open skin in the area being iced.  Current infections.  Rheumatoid arthritis.  Poor circulation.  Diabetes. Ice slows the blood flow in the region it is applied. This is beneficial when trying to stop inflamed tissues from spreading irritating chemicals to surrounding tissues. However, if you expose your skin to cold temperatures for too long or without the proper protection, you can damage your skin or nerves. Watch for signs of skin damage due to cold. HOME CARE INSTRUCTIONS Follow  these tips to use ice and cold packs safely.  Place a dry or damp towel between the ice and skin. A damp towel will cool the skin more quickly, so you may need to shorten the time that the ice is used.  For a more rapid response, add gentle compression to the ice.  Ice for no more than 10 to 20 minutes at a time. The bonier the area you are icing, the less time it will take to get the benefits of ice.  Check your skin after 5 minutes to make sure there are no signs of a poor response to cold or skin damage.  Rest 20 minutes or more between uses.  Once your skin is numb, you can end your treatment. You can test numbness by very lightly touching your skin. The touch should be so light that you do not see the skin dimple from the pressure of your fingertip. When using ice, most people will feel these normal sensations in this order: cold, burning, aching, and numbness.  Do not use ice on someone who cannot communicate their responses to pain, such as small children or people with dementia. HOW TO MAKE AN ICE PACK Ice packs are the most common way to use ice therapy. Other methods include ice massage, ice baths, and cryosprays. Muscle creams that cause a cold, tingly feeling do not offer the same benefits that ice offers and should not be used as a substitute unless recommended by your caregiver. To make an ice pack, do one of the following:  Place crushed ice or a  bag of frozen vegetables in a sealable plastic bag. Squeeze out the excess air. Place this bag inside another plastic bag. Slide the bag into a pillowcase or place a damp towel between your skin and the bag.  Mix 3 parts water with 1 part rubbing alcohol. Freeze the mixture in a sealable plastic bag. When you remove the mixture from the freezer, it will be slushy. Squeeze out the excess air. Place this bag inside another plastic bag. Slide the bag into a pillowcase or place a damp towel between your skin and the bag. SEEK MEDICAL CARE  IF:  You develop white spots on your skin. This may give the skin a blotchy (mottled) appearance.  Your skin turns blue or pale.  Your skin becomes waxy or hard.  Your swelling gets worse. MAKE SURE YOU:   Understand these instructions.  Will watch your condition.  Will get help right away if you are not doing well or get worse. Document Released: 02/02/2011 Document Revised: 10/23/2013 Document Reviewed: 02/02/2011 [facility] Patient Information 2015 [place], [place]. This information is not intended to replace advice given to you by your health care provider. Make sure you discuss any questions you have with your health care provider.

## 2014-11-11 NOTE — ED Provider Notes (Signed)
CSN: [id number]     Arrival date & time 11/11/14  0036 History   First MD Initiated Contact with Patient 11/11/14 0057     Chief Complaint  Patient presents with  . Wrist Pain     (Consider location/radiation/quality/duration/timing/severity/associated sxs/prior Treatment) Patient is a 39 y.o. male presenting with wrist pain. The history is provided by the patient. No language interpreter was used.  Wrist Pain Pertinent negatives include no chills, fever or numbness. Associated symptoms comments: He is in the emergency department with complaint of painful right wrist. He reports a ganglion cyst was aspirated 2 days ago and has been painful since that time. No swelling or redness. No fever. He has a wrist splint he has been wearing but reports pain persists..    Past Medical History  Diagnosis Date  . Obstructive sleep apnea (adult) (pediatric)   . Anxiety and depression   . Essential hypertension, benign   . Obesity, unspecified   . Herpes zoster infection 07/2010, 12/2010    with post-herpetic neuralgia  . Vitamin D deficiency   . Crohn's disease   . Hyperlipidemia   . CMV (cytomegalovirus infection) 10/28/2010  . Nephrolithiasis   . Fistula, perirectal   . Sinus infection 08/05/11  . Depression   . Syncope   . Pyelonephritis 2015  . Plantar fasciitis of right foot 08/22/2013  . Tinea pedis 06/27/2013  . Anal fissure - posterior 02/15/2014   Past Surgical History  Procedure Laterality Date  . Small intestine surgery  08/05/07    1 ft removed, ileo-cecectomy  . Hypospadias correction      Childhood   . Upper gastrointestinal endoscopy  10/15/2010    w/biopsy, mild gastritis and duodenitis  . Colonoscopy  10/14/2007    crohn's colitis, aphthous ulcers, mild anal stenosis  . Colon resection    . Tooth extraction     Family History  Problem Relation Age of Onset  . Heart disease Father     CAD/MI  . Hyperlipidemia Father   . Hypertension Father   . Gout Father   . Sleep apnea  Father   . Colon cancer Neg Hx   . Diabetes Neg Hx   . COPD Neg Hx   . Obesity Mother   . Allergies Mother   . Cancer Mother   . Stroke Maternal Grandfather   . Allergies Sister   . Asthma Sister    History  Substance Use Topics  . Smoking status: Never Smoker   . Smokeless tobacco: Never Used  . Alcohol Use: 1.8 oz/week    3 Shots of liquor per week     Comment: 3/week    Review of Systems  Constitutional: Negative for fever and chills.  Musculoskeletal: Negative.        See HPI  Skin: Negative.   Neurological: Negative.  Negative for numbness.      Allergies  Review of patient's allergies indicates no known allergies.  Home Medications   Prior to Admission medications   Medication Sig Start Date End Date Taking? Authorizing Provider  Adalimumab 40 MG/0.8ML PNKT Inject 40 mg into the skin every 14 (fourteen) days.   Yes Historical Provider, MD  alprazolam Duanne Moron) 2 MG tablet Take 0.5 tablets (1 mg total) by mouth 2 (two) times daily. 10/05/14  Yes Biagio Borg, MD  colestipol (COLESTID) 1 G tablet Take 4 tablets (4 g total) by mouth daily with supper. 10/04/14  Yes Biagio Borg, MD  diphenoxylate-atropine (LOMOTIL) 2.5-0.025 MG per  tablet Take 1 tablet by mouth 4 (four) times daily as needed for diarrhea or loose stools. Patient taking differently: Take 1 tablet by mouth daily.  03/16/14  Yes Gatha Mayer, MD  emtricitabine-tenofovir (TRUVADA) 200-300 MG per tablet Take 1 tablet by mouth daily. 10/02/14  Yes Gatha Mayer, MD  gabapentin (NEURONTIN) 100 MG capsule Take 1 capsule (100 mg total) by mouth at bedtime. 11/07/14  Yes Lyndal Pulley, DO  lisinopril-hydrochlorothiazide (PRINZIDE,ZESTORETIC) 20-25 MG per tablet Take 1 tablet by mouth daily. 10/04/14  Yes Biagio Borg, MD  mirtazapine (REMERON) 15 MG tablet Take 1 tablet (15 mg total) by mouth at bedtime. 10/05/14  Yes Biagio Borg, MD  naproxen (NAPROSYN) 500 MG tablet Take 1 tablet (500 mg total) by mouth 2 (two)  times daily with a meal. 11/02/14  Yes Biagio Borg, MD  PARoxetine (PAXIL) 20 MG tablet Take 2 tablets (40 mg total) by mouth daily. 10/04/14  Yes Biagio Borg, MD  acetaminophen (TYLENOL) 500 MG tablet Take 1,000 mg by mouth every 6 (six) hours as needed for headache.    Historical Provider, MD   BP 107/57 mmHg  Pulse 75  Temp(Src) 98 F (36.7 C) (Oral)  Resp 16  Ht 5' 9"  (1.753 m)  Wt 230 lb (104.327 kg)  BMI 33.95 kg/m2  SpO2 96% Physical Exam  Constitutional: He is oriented to person, place, and time. He appears well-developed and well-nourished.  Neck: Normal range of motion.  Pulmonary/Chest: Effort normal.  Musculoskeletal: Normal range of motion.  Right wrist is not swollen or red. There is tenderness over radial aspect. FROM all digits of hand. No redness or streaking into the forearm.   Neurological: He is alert and oriented to person, place, and time.  Skin: Skin is warm and dry.  Psychiatric: He has a normal mood and affect.    ED Course  Procedures (including critical care time) Labs Review Labs Reviewed - No data to display  Imaging Review No results found.   EKG Interpretation None      MDM   Final diagnoses:  None    1. Right wrist pain  He has orthopedic follow up next week. No evidence of infection. Stable for discharge.     Charlann Lange, [name] 11/11/14 0210  Everlene Balls, MD 11/11/14 [phone]

## 2014-11-12 MED ORDER — TRAMADOL HCL 50 MG PO TABS: 50.0000 mg | ORAL_TABLET | Freq: Two times a day (BID) | ORAL | Status: DC | PRN

## 2014-11-13 ENCOUNTER — Encounter: Payer: Self-pay | Admitting: Family Medicine

## 2014-11-13 ENCOUNTER — Ambulatory Visit (INDEPENDENT_AMBULATORY_CARE_PROVIDER_SITE_OTHER): Payer: No Typology Code available for payment source | Admitting: Family Medicine

## 2014-11-13 VITALS — BP 122/80 | HR 72 | Ht 69.0 in | Wt 243.0 lb

## 2014-11-13 DIAGNOSIS — M6740 Ganglion, unspecified site: Secondary | ICD-10-CM | POA: Diagnosis not present

## 2014-11-13 NOTE — Progress Notes (Signed)
Pre visit review using our clinic review tool, if applicable. No additional management support is needed unless otherwise documented below in the visit note. 

## 2014-11-13 NOTE — Patient Instructions (Signed)
Good to see you Ice is your friend Continue the brace at night and move forward with MRI for further evaluation Hand specialist will call you Call me if you have questions.

## 2014-11-13 NOTE — Progress Notes (Signed)
[facility] [address] [place], [place] [address] Phone: [phone] Subjective:    I'm seeing this patient by the request  of:  Cathlean Cower, MD   CC: Right thumb pain  [name] [name] is a 39 y.o. male coming in with complaint of right thumb pain. Patient states for the last week he is having increasing pain of the wrist. Patient states that it is hard for him to move certain directions. Patient states when he moves the thumb and can be more painful. Patient denies any numbness or tingling and denies any type of injury. Patient rates the severity of pain a 7 out of 10. Denies any numbness.  She was seen previously and did have a de Quervain's tenosynovitis as well as a ganglion cyst. We attempted aspiration but patient did have a fainting spell during the procedure and we did have to abort. Patient states that since then unfortunate the pain is not improved. Patient had very minimal aspiration noted at that time. Patient's ganglion cyst is very close to the radial artery and makes ultrasound guided aspiration fairly difficult.    Past medical history, social, surgical and family history all reviewed in electronic medical record.   Review of Systems: No headache, visual changes, nausea, vomiting, diarrhea, constipation, dizziness, abdominal pain, skin rash, fevers, chills, night sweats, weight loss, swollen lymph nodes, body aches, joint swelling, muscle aches, chest pain, shortness of breath, mood changes.   Objective Blood pressure 122/80, pulse 72, height 5' 9"  (1.753 m), weight 243 lb (110.224 kg), SpO2 96 %.  General: No apparent distress alert and oriented x3 mood and affect normal, dressed appropriately.  HEENT: Pupils equal, extraocular movements intact  Respiratory: Patient's speak in full sentences and does not appear short of breath  Cardiovascular: No lower extremity edema, non tender, no erythema  Skin: Warm dry intact with no signs  of infection or rash on extremities or on axial skeleton.  Abdomen: Soft nontender  Neuro: Cranial nerves II through XII are intact, neurovascularly intact in all extremities with 2+ DTRs and 2+ pulses.  Lymph: No lymphadenopathy of posterior or anterior cervical chain or axillae bilaterally.  Gait normal with good balance and coordination.  MSK:  Non tender with full range of motion and good stability and symmetric strength and tone of shoulders, elbows,  hip, knee and ankles bilaterally.  Wrist: Right Inspection normal with no visible erythema or swelling. ROM smooth and normal with good flexion and extension and ulnar/radial deviation that is symmetrical with opposite wrist. Dizziness tender to palpation over the  Abductor pollicis longus. Patient does have what appears to be a ganglion cyst sitting directly under the radial artery. No snuffbox tenderness. No tenderness over Canal of [place]. Strength 5/5 in all directions without pain. Positive Finkelstein's negative Tinel's Negative Watson's test.  Contralateral wrist unremarkable   MSK US performed of: Right wrist This study was ordered, performed, and interpreted by Charlann Boxer D.O.  Wrist: All extensor compartments visualized and tendons all normal and does have some hypoechoic changes of the palmar longus tendon. Patient also has what appears to be a ganglion cyst right under radial artery TFCC intact. Scapholunate ligament intact. unable to see the scaphoid bone secondary to hypoechoic changes.  Carpal tunnel visualized and median nerve area normal, flexor tendons all normal in appearance without fraying, tears, or sheath effusions. Power doppler signal normal.  IMPRESSION Ganglion cyst under radial artery  t  Impression and Recommendations:  This case required medical decision making of moderate complexity.

## 2014-11-13 NOTE — Assessment & Plan Note (Signed)
Due to the location to the radial artery as well as patient's response to the prior aspiration I do think that surgical intervention would likely be necessary. Patient would like further evaluation with an MRI and I do think it is highly unlikely a tenosynovitis giant cell tumor based on the appearance of cystic fluid on ultrasound. Patient has been referred to hand specialist and follow-up with me on an as needed. Patient has tramadol that is helping.

## 2014-11-16 ENCOUNTER — Encounter: Payer: Self-pay | Admitting: Family Medicine

## 2014-11-16 ENCOUNTER — Emergency Department (HOSPITAL_COMMUNITY)
Admission: EM | Admit: 2014-11-16 | Discharge: 2014-11-16 | Disposition: A | Discharge status: Home / Self Care | Payer: No Typology Code available for payment source | Attending: Emergency Medicine | Admitting: Emergency Medicine

## 2014-11-16 ENCOUNTER — Encounter (HOSPITAL_COMMUNITY): Payer: Self-pay | Admitting: Emergency Medicine

## 2014-11-16 DIAGNOSIS — I10 Essential (primary) hypertension: Secondary | ICD-10-CM | POA: Insufficient documentation

## 2014-11-16 DIAGNOSIS — Z8669 Personal history of other diseases of the nervous system and sense organs: Secondary | ICD-10-CM | POA: Diagnosis not present

## 2014-11-16 DIAGNOSIS — Z87448 Personal history of other diseases of urinary system: Secondary | ICD-10-CM | POA: Diagnosis not present

## 2014-11-16 DIAGNOSIS — Z8719 Personal history of other diseases of the digestive system: Secondary | ICD-10-CM | POA: Diagnosis not present

## 2014-11-16 DIAGNOSIS — Z87442 Personal history of urinary calculi: Secondary | ICD-10-CM | POA: Insufficient documentation

## 2014-11-16 DIAGNOSIS — F329 Major depressive disorder, single episode, unspecified: Secondary | ICD-10-CM | POA: Insufficient documentation

## 2014-11-16 DIAGNOSIS — Z79899 Other long term (current) drug therapy: Secondary | ICD-10-CM | POA: Insufficient documentation

## 2014-11-16 DIAGNOSIS — E785 Hyperlipidemia, unspecified: Secondary | ICD-10-CM | POA: Insufficient documentation

## 2014-11-16 DIAGNOSIS — Z791 Long term (current) use of non-steroidal anti-inflammatories (NSAID): Secondary | ICD-10-CM | POA: Diagnosis not present

## 2014-11-16 DIAGNOSIS — M6740 Ganglion, unspecified site: Secondary | ICD-10-CM

## 2014-11-16 DIAGNOSIS — M25531 Pain in right wrist: Secondary | ICD-10-CM | POA: Diagnosis present

## 2014-11-16 DIAGNOSIS — Z8709 Personal history of other diseases of the respiratory system: Secondary | ICD-10-CM | POA: Diagnosis not present

## 2014-11-16 DIAGNOSIS — Z872 Personal history of diseases of the skin and subcutaneous tissue: Secondary | ICD-10-CM | POA: Diagnosis not present

## 2014-11-16 DIAGNOSIS — M67431 Ganglion, right wrist: Secondary | ICD-10-CM | POA: Diagnosis not present

## 2014-11-16 DIAGNOSIS — E669 Obesity, unspecified: Secondary | ICD-10-CM | POA: Insufficient documentation

## 2014-11-16 DIAGNOSIS — Z8619 Personal history of other infectious and parasitic diseases: Secondary | ICD-10-CM | POA: Diagnosis not present

## 2014-11-16 MED ORDER — HYDROCODONE-ACETAMINOPHEN 5-325 MG PO TABS
1.0000 | ORAL_TABLET | Freq: Once | ORAL | Status: AC
  Administered 2014-11-16: 1 via ORAL
  Filled 2014-11-16: qty 1

## 2014-11-16 MED ORDER — HYDROCODONE-ACETAMINOPHEN 5-325 MG PO TABS: 1.0000 | ORAL_TABLET | ORAL | Status: DC | PRN

## 2014-11-16 NOTE — ED Notes (Signed)
Pt arrivedwith right handed wrist pain.  Pt is scheduled for MRI and surgery but the pain is too much to bear.  Pt states Doctors office sent him here.

## 2014-11-16 NOTE — Discharge Instructions (Signed)

## 2014-11-16 NOTE — ED Provider Notes (Signed)
CSN: [id number]     Arrival date & time 11/16/14  2240 History   First MD Initiated Contact with Patient 11/16/14 2305     Chief Complaint  Patient presents with  . Wrist Pain     (Consider location/radiation/quality/duration/timing/severity/associated sxs/prior Treatment) HPI   39 year old obese male who had a ganglion cyst aspirated from his right wrist approximately a week ago and was also diagnosed with [place] Must Quervain's tenosynovitis presenting for evaluation of worsening right wrist pain. Per prior note, an attempt at aspiration of the ganglion cyst a week ago did cause a fainting spell and due to the location of the cyst being near the radial artery, the aspiration procedure was aborted.  Pt was discharge with Ultram.  Pt was told he will likely benefit from MRI and surgery by hand specialist.  In the interim, patient has been taking increased dose of Ultram without adequate pain management. He ran out of his medication today, and did call the office for a refill however the staff has left for the weekend. He was recommended to come to ER for further management. Aside from persistent sharp achy shooting pain to his right wrist he denies having any fever, rash, or new numbness. He is right hand dominant. He reported worrying the wrist brace has helped with his pain. He is set up for an MRI along with following up with increased with [facility] for further management.    Past Medical History  Diagnosis Date  . Obstructive sleep apnea (adult) (pediatric)   . Anxiety and depression   . Essential hypertension, benign   . Obesity, unspecified   . Herpes zoster infection 07/2010, 12/2010    with post-herpetic neuralgia  . Vitamin D deficiency   . Crohn's disease   . Hyperlipidemia   . CMV (cytomegalovirus infection) 10/28/2010  . Nephrolithiasis   . Fistula, perirectal   . Sinus infection 08/05/11  . Depression   . Syncope   . Pyelonephritis 2015  . Plantar fasciitis of right foot  08/22/2013  . Tinea pedis 06/27/2013  . Anal fissure - posterior 02/15/2014   Past Surgical History  Procedure Laterality Date  . Small intestine surgery  08/05/07    1 ft removed, ileo-cecectomy  . Hypospadias correction      Childhood   . Upper gastrointestinal endoscopy  10/15/2010    w/biopsy, mild gastritis and duodenitis  . Colonoscopy  10/14/2007    crohn's colitis, aphthous ulcers, mild anal stenosis  . Colon resection    . Tooth extraction     Family History  Problem Relation Age of Onset  . Heart disease Father     CAD/MI  . Hyperlipidemia Father   . Hypertension Father   . Gout Father   . Sleep apnea Father   . Colon cancer Neg Hx   . Diabetes Neg Hx   . COPD Neg Hx   . Obesity Mother   . Allergies Mother   . Cancer Mother   . Stroke Maternal Grandfather   . Allergies Sister   . Asthma Sister    History  Substance Use Topics  . Smoking status: Never Smoker   . Smokeless tobacco: Never Used  . Alcohol Use: 1.8 oz/week    3 Shots of liquor per week     Comment: 3/week    Review of Systems  Constitutional: Negative for fever.  Musculoskeletal: Positive for myalgias and arthralgias.  Skin: Negative for rash.  Neurological: Negative for numbness.  Allergies  Review of patient's allergies indicates no known allergies.  Home Medications   Prior to Admission medications   Medication Sig Start Date End Date Taking? Authorizing Provider  acetaminophen (TYLENOL) 500 MG tablet Take 1,000 mg by mouth every 6 (six) hours as needed for headache.    Historical Provider, MD  Adalimumab 40 MG/0.8ML PNKT Inject 40 mg into the skin every 14 (fourteen) days.    Historical Provider, MD  alprazolam Duanne Moron) 2 MG tablet Take 0.5 tablets (1 mg total) by mouth 2 (two) times daily. 10/05/14   Biagio Borg, MD  colestipol (COLESTID) 1 G tablet Take 4 tablets (4 g total) by mouth daily with supper. 10/04/14   Biagio Borg, MD  diphenoxylate-atropine (LOMOTIL) 2.5-0.025 MG per  tablet Take 1 tablet by mouth 4 (four) times daily as needed for diarrhea or loose stools. Patient taking differently: Take 1 tablet by mouth daily.  03/16/14   Gatha Mayer, MD  emtricitabine-tenofovir (TRUVADA) 200-300 MG per tablet Take 1 tablet by mouth daily. 10/02/14   Gatha Mayer, MD  gabapentin (NEURONTIN) 100 MG capsule Take 1 capsule (100 mg total) by mouth at bedtime. 11/07/14   Lyndal Pulley, DO  HYDROcodone-acetaminophen (NORCO/VICODIN) 5-325 MG per tablet Take 1-2 tablets by mouth every 4 (four) hours as needed. 11/11/14   Charlann Lange, [name]  lisinopril-hydrochlorothiazide (PRINZIDE,ZESTORETIC) 20-25 MG per tablet Take 1 tablet by mouth daily. 10/04/14   Biagio Borg, MD  mirtazapine (REMERON) 15 MG tablet Take 1 tablet (15 mg total) by mouth at bedtime. 10/05/14   Biagio Borg, MD  naproxen (NAPROSYN) 500 MG tablet Take 1 tablet (500 mg total) by mouth 2 (two) times daily with a meal. 11/02/14   Biagio Borg, MD  PARoxetine (PAXIL) 20 MG tablet Take 2 tablets (40 mg total) by mouth daily. 10/04/14   Biagio Borg, MD  traMADol (ULTRAM) 50 MG tablet Take 1 tablet (50 mg total) by mouth 2 (two) times daily as needed. 11/12/14   Lyndal Pulley, DO   BP 126/73 mmHg  Pulse 105  Resp 20  SpO2 95% Physical Exam  Constitutional: He appears well-developed and well-nourished. No distress.  HENT:  Head: Atraumatic.  Eyes: Conjunctivae are normal.  Neck: Neck supple.  Musculoskeletal: He exhibits tenderness (Right wrist: mobile nodule noted along the radial aspect of wrist volarly with moderate tenderness to palpation, worsening with wrist flexion.  No gross deformity, and no rash.  normal grip strength, brisk cap refill).  Neurological: He is alert.  Skin: No rash noted.  Psychiatric: He has a normal mood and affect.  Nursing note and vitals reviewed.   ED Course  Procedures (including critical care time)  Patient with a ganglion cyst to his right wrist who is here requesting for  pain management. No worsening of symptoms and no signs of infection. Patient is neurovascularly intact. Plan to prescribe a short course of pain medication and patient will follow-up as previously scheduled for further management.  Labs Review Labs Reviewed - No data to display  Imaging Review No results found.   EKG Interpretation None      MDM   Final diagnoses:  Ganglion cyst of flexor tendon sheath  Right wrist pain    BP 126/73 mmHg  Pulse 105  Temp(Src) 97.9 F (36.6 C) (Oral)  Resp 20  SpO2 95%     Domenic Moras, [name] 11/17/14 3875  Everlene Balls, MD 11/19/14 0145

## 2014-11-20 ENCOUNTER — Ambulatory Visit (HOSPITAL_COMMUNITY): Admission: RE | Admit: 2014-11-20 | Payer: No Typology Code available for payment source | Source: Ambulatory Visit

## 2014-11-20 ENCOUNTER — Encounter (HOSPITAL_COMMUNITY): Payer: Self-pay | Admitting: Emergency Medicine

## 2014-11-20 ENCOUNTER — Emergency Department (HOSPITAL_COMMUNITY)
Admission: EM | Admit: 2014-11-20 | Discharge: 2014-11-21 | Disposition: A | Discharge status: Home / Self Care | Payer: No Typology Code available for payment source | Attending: Emergency Medicine | Admitting: Emergency Medicine

## 2014-11-20 ENCOUNTER — Emergency Department (HOSPITAL_COMMUNITY): Payer: No Typology Code available for payment source

## 2014-11-20 ENCOUNTER — Encounter: Payer: Self-pay | Admitting: Family Medicine

## 2014-11-20 DIAGNOSIS — F329 Major depressive disorder, single episode, unspecified: Secondary | ICD-10-CM | POA: Insufficient documentation

## 2014-11-20 DIAGNOSIS — Z8719 Personal history of other diseases of the digestive system: Secondary | ICD-10-CM | POA: Insufficient documentation

## 2014-11-20 DIAGNOSIS — M25531 Pain in right wrist: Secondary | ICD-10-CM | POA: Insufficient documentation

## 2014-11-20 DIAGNOSIS — I10 Essential (primary) hypertension: Secondary | ICD-10-CM | POA: Insufficient documentation

## 2014-11-20 DIAGNOSIS — Z791 Long term (current) use of non-steroidal anti-inflammatories (NSAID): Secondary | ICD-10-CM | POA: Diagnosis not present

## 2014-11-20 DIAGNOSIS — F419 Anxiety disorder, unspecified: Secondary | ICD-10-CM | POA: Insufficient documentation

## 2014-11-20 DIAGNOSIS — Z79899 Other long term (current) drug therapy: Secondary | ICD-10-CM | POA: Insufficient documentation

## 2014-11-20 DIAGNOSIS — E669 Obesity, unspecified: Secondary | ICD-10-CM | POA: Diagnosis not present

## 2014-11-20 DIAGNOSIS — Z8619 Personal history of other infectious and parasitic diseases: Secondary | ICD-10-CM | POA: Diagnosis not present

## 2014-11-20 DIAGNOSIS — Z87442 Personal history of urinary calculi: Secondary | ICD-10-CM | POA: Insufficient documentation

## 2014-11-20 DIAGNOSIS — Z8669 Personal history of other diseases of the nervous system and sense organs: Secondary | ICD-10-CM | POA: Diagnosis not present

## 2014-11-20 MED ORDER — TRAMADOL HCL 50 MG PO TABS: 50.0000 mg | ORAL_TABLET | Freq: Two times a day (BID) | ORAL | Status: DC | PRN

## 2014-11-20 NOTE — ED Provider Notes (Signed)
CSN: [id number]     Arrival date & time 11/20/14  2225 History   First MD Initiated Contact with Patient 11/20/14 2242     Chief Complaint  Patient presents with  . Wrist Pain     (Consider location/radiation/quality/duration/timing/severity/associated sxs/prior Treatment) HPI Comments: 39 year old male presents to the emergency department for further evaluation of wrist pain. This is the third time the patient has been evaluated for this pain in the last week. He reports history of a ganglion cyst which has been causing him some mild discomfort. Pain worsened when his dog hit his wrist this evening. Patient has been wearing a thumb spica brace for stabilization. He has been applying ice and taking naproxen as well as using topical Voltaren gel. Patient states that he is scheduled to follow-up with an orthopedist on 11/27/2014. He was also scheduled for an outpatient MRI today which he discontinued because of cost. Patient denies any numbness or weakness in his right hand. No associated redness or significant swelling.  Patient is a 39 y.o. male presenting with wrist pain. The history is provided by the patient. No language interpreter was used.  Wrist Pain Associated symptoms include arthralgias. Pertinent negatives include no numbness or weakness.    Past Medical History  Diagnosis Date  . Obstructive sleep apnea (adult) (pediatric)   . Anxiety and depression   . Essential hypertension, benign   . Obesity, unspecified   . Herpes zoster infection 07/2010, 12/2010    with post-herpetic neuralgia  . Vitamin D deficiency   . Crohn's disease   . Hyperlipidemia   . CMV (cytomegalovirus infection) 10/28/2010  . Nephrolithiasis   . Fistula, perirectal   . Sinus infection 08/05/11  . Depression   . Syncope   . Pyelonephritis 2015  . Plantar fasciitis of right foot 08/22/2013  . Tinea pedis 06/27/2013  . Anal fissure - posterior 02/15/2014   Past Surgical History  Procedure Laterality Date  .  Small intestine surgery  08/05/07    1 ft removed, ileo-cecectomy  . Hypospadias correction      Childhood   . Upper gastrointestinal endoscopy  10/15/2010    w/biopsy, mild gastritis and duodenitis  . Colonoscopy  10/14/2007    crohn's colitis, aphthous ulcers, mild anal stenosis  . Colon resection    . Tooth extraction     Family History  Problem Relation Age of Onset  . Heart disease Father     CAD/MI  . Hyperlipidemia Father   . Hypertension Father   . Gout Father   . Sleep apnea Father   . Colon cancer Neg Hx   . Diabetes Neg Hx   . COPD Neg Hx   . Obesity Mother   . Allergies Mother   . Cancer Mother   . Stroke Maternal Grandfather   . Allergies Sister   . Asthma Sister    History  Substance Use Topics  . Smoking status: Never Smoker   . Smokeless tobacco: Never Used  . Alcohol Use: 1.8 oz/week    3 Shots of liquor per week     Comment: 3/week    Review of Systems  Musculoskeletal: Positive for arthralgias.  Neurological: Negative for weakness and numbness.  All other systems reviewed and are negative.   Allergies  Review of patient's allergies indicates no known allergies.  Home Medications   Prior to Admission medications   Medication Sig Start Date End Date Taking? Authorizing Provider  acetaminophen (TYLENOL) 500 MG tablet Take 1,000 mg by  mouth every 6 (six) hours as needed for headache.    Historical Provider, MD  Adalimumab 40 MG/0.8ML PNKT Inject 40 mg into the skin every 14 (fourteen) days.    Historical Provider, MD  alprazolam Duanne Moron) 2 MG tablet Take 0.5 tablets (1 mg total) by mouth 2 (two) times daily. 10/05/14   Biagio Borg, MD  colestipol (COLESTID) 1 G tablet Take 4 tablets (4 g total) by mouth daily with supper. 10/04/14   Biagio Borg, MD  diphenoxylate-atropine (LOMOTIL) 2.5-0.025 MG per tablet Take 1 tablet by mouth 4 (four) times daily as needed for diarrhea or loose stools. Patient taking differently: Take 1 tablet by mouth daily.   03/16/14   Gatha Mayer, MD  emtricitabine-tenofovir (TRUVADA) 200-300 MG per tablet Take 1 tablet by mouth daily. 10/02/14   Gatha Mayer, MD  gabapentin (NEURONTIN) 100 MG capsule Take 1 capsule (100 mg total) by mouth at bedtime. 11/07/14   Lyndal Pulley, DO  HYDROcodone-acetaminophen (NORCO/VICODIN) 5-325 MG per tablet Take 1-2 tablets by mouth every 4 (four) hours as needed. 11/16/14   Domenic Moras, [name]  lisinopril-hydrochlorothiazide (PRINZIDE,ZESTORETIC) 20-25 MG per tablet Take 1 tablet by mouth daily. 10/04/14   Biagio Borg, MD  mirtazapine (REMERON) 15 MG tablet Take 1 tablet (15 mg total) by mouth at bedtime. 10/05/14   Biagio Borg, MD  naproxen (NAPROSYN) 500 MG tablet Take 1 tablet (500 mg total) by mouth 2 (two) times daily with a meal. 11/02/14   Biagio Borg, MD  PARoxetine (PAXIL) 20 MG tablet Take 2 tablets (40 mg total) by mouth daily. 10/04/14   Biagio Borg, MD  traMADol (ULTRAM) 50 MG tablet Take 1 tablet (50 mg total) by mouth 2 (two) times daily as needed. 11/12/14   Lyndal Pulley, DO   BP 129/83 mmHg  Pulse 105  Temp(Src) 98 F (36.7 C) (Oral)  Resp 18  Ht 5' 9"  (1.753 m)  Wt 240 lb (108.863 kg)  BMI 35.43 kg/m2  SpO2 96%   Physical Exam  Constitutional: He is oriented to person, place, and time. He appears well-developed and well-nourished. No distress.  Nontoxic/nonseptic appearing  HENT:  Head: Normocephalic and atraumatic.  Eyes: Conjunctivae and EOM are normal. No scleral icterus.  Neck: Normal range of motion.  Cardiovascular: Normal rate, regular rhythm and intact distal pulses.   Distal radial pulse 2+ in the right upper extremity.  Pulmonary/Chest: Effort normal. No respiratory distress.  Musculoskeletal: Normal range of motion. He exhibits tenderness.  Tenderness to the volar lateral aspect of the right wrist. There is a palpable ganglion cyst without evidence of secondary infection. Normal range of motion of the right wrist appreciated. No evidence  of septic joint.  Neurological: He is alert and oriented to person, place, and time. He exhibits normal muscle tone. Coordination normal.  Sensation to light touch intact in the right upper extremity. Finger to thumb opposition intact. Patient able to wiggle all fingers of right hand.  Skin: Skin is warm and dry. No rash noted. He is not diaphoretic. No erythema. No pallor.  Psychiatric: He has a normal mood and affect. His behavior is normal.  Nursing note and vitals reviewed.   ED Course  Procedures (including critical care time) Labs Review Labs Reviewed - No data to display  Imaging Review Dg Wrist Complete Right  11/20/2014   CLINICAL DATA:  Right wrist pain. History of right wrist cyst recently drained.  EXAM: RIGHT WRIST -  COMPLETE 3+ VIEW  COMPARISON:  11/02/2014  FINDINGS: There is no evidence of fracture or dislocation. There is no evidence of arthropathy or other focal bone abnormality. Soft tissues are unremarkable.  IMPRESSION: Negative.   Electronically Signed   By: Margarette Canada M.D.   On: 11/20/2014 23:23     EKG Interpretation None      MDM   Final diagnoses:  Right wrist pain    39 year old male presents to the emergency department for the third time in the last week for evaluation of right wrist pain. Patient is neurovascularly intact. No evidence of septic joint. X-ray negative for fracture, dislocation, or bony deformity. Patient has been previously diagnosed with a ganglion cyst. He has orthopedic follow-up scheduled in one week. Have advised continued supportive treatment as outpatient with bracing, icing, and naproxen. Patient given #3 tabs of Tramadol for pain control; he was already prescribed Tramadol and Norco for pain in the last week. PCP follow-up advised for continued pain management as needed. Return precautions given. Patient agreeable to plan with no unaddressed concerns.   Filed Vitals:   11/20/14 2230 11/20/14 2345  BP: 129/83 130/82  Pulse: 105  72  Temp: 98 F (36.7 C) 98.2 F (36.8 C)  TempSrc: Oral Oral  Resp: 18 14  Height: 5' 9"  (1.753 m)   Weight: 240 lb (108.863 kg)   SpO2: 96% 95%     Antonietta Breach, [name] 11/20/14 2349  Noemi Chapel, MD 11/21/14 1002

## 2014-11-20 NOTE — Discharge Instructions (Signed)
Arthralgia Your caregiver has diagnosed you as suffering from an arthralgia. Arthralgia means there is pain in a joint. This can come from many reasons including:  Bruising the joint which causes soreness (inflammation) in the joint.  Wear and tear on the joints which occur as we grow older (osteoarthritis).  Overusing the joint.  Various forms of arthritis.  Infections of the joint. Regardless of the cause of pain in your joint, most of these different pains respond to anti-inflammatory drugs and rest. The exception to this is when a joint is infected, and these cases are treated with antibiotics, if it is a bacterial infection. HOME CARE INSTRUCTIONS   Rest the injured area for as long as directed by your caregiver. Then slowly start using the joint as directed by your caregiver and as the pain allows. Crutches as directed may be useful if the ankles, knees or hips are involved. If the knee was splinted or casted, continue use and care as directed. If an stretchy or elastic wrapping bandage has been applied today, it should be removed and re-applied every 3 to 4 hours. It should not be applied tightly, but firmly enough to keep swelling down. Watch toes and feet for swelling, bluish discoloration, coldness, numbness or excessive pain. If any of these problems (symptoms) occur, remove the ace bandage and re-apply more loosely. If these symptoms persist, contact your caregiver or return to this location.  For the first 24 hours, keep the injured extremity elevated on pillows while lying down.  Apply ice for 15-20 minutes to the sore joint every couple hours while awake for the first half day. Then 03-04 times per day for the first 48 hours. Put the ice in a plastic bag and place a towel between the bag of ice and your skin.  Wear any splinting, casting, elastic bandage applications, or slings as instructed.  Only take over-the-counter or prescription medicines for pain, discomfort, or fever as  directed by your caregiver. Do not use aspirin immediately after the injury unless instructed by your physician. Aspirin can cause increased bleeding and bruising of the tissues.  If you were given crutches, continue to use them as instructed and do not resume weight bearing on the sore joint until instructed. Persistent pain and inability to use the sore joint as directed for more than 2 to 3 days are warning signs indicating that you should see a caregiver for a follow-up visit as soon as possible. Initially, a hairline fracture (break in bone) may not be evident on X-rays. Persistent pain and swelling indicate that further evaluation, non-weight bearing or use of the joint (use of crutches or slings as instructed), or further X-rays are indicated. X-rays may sometimes not show a small fracture until a week or 10 days later. Make a follow-up appointment with your own caregiver or one to whom we have referred you. A radiologist (specialist in reading X-rays) may read your X-rays. Make sure you know how you are to obtain your X-ray results. Do not assume everything is normal if you do not hear from [place]. SEEK MEDICAL CARE IF: Bruising, swelling, or pain increases. SEEK IMMEDIATE MEDICAL CARE IF:   Your fingers or toes are numb or blue.  The pain is not responding to medications and continues to stay the same or get worse.  The pain in your joint becomes severe.  You develop a fever over 102 F (38.9 C).  It becomes impossible to move or use the joint. MAKE SURE YOU:  Understand these instructions.  Will watch your condition.  Will get help right away if you are not doing well or get worse. Document Released: 06/08/2005 Document Revised: 08/31/2011 Document Reviewed: 01/25/2008 [facility] Patient Information 2015 [place], [place]. This information is not intended to replace advice given to you by your health care provider. Make sure you discuss any questions you have with your health care  provider.  RICE: Routine Care for Injuries Rest, Ice, Compression, and Elevation (RICE) are often used to care for injuries. HOME CARE  Rest your injury.  Put ice on the injury.  Put ice in a plastic bag.  Place a towel between your skin and the bag.  Leave the ice on for 15-20 minutes, 03-04 times a day. Do this for as long as told by your doctor.  Apply pressure (compression) with an elastic bandage. Remove and reapply the bandage every 3 to 4 hours. Do not wrap the bandage too tight. Wrap the bandage looser if the fingers or toes are puffy (swollen), blue, cold, painful, or lose feeling (numb).  Raise (elevate) your injury. Raise your injury above the heart if you can. GET HELP RIGHT AWAY IF:  You have lasting pain or puffiness.  Your injury is red, weak, or loses feeling.  Your problems get worse, not better, after several days. MAKE SURE YOU:  Understand these instructions.  Will watch your condition.  Will get help right away if you are not doing well or get worse. Document Released: 11/25/2007 Document Revised: 08/31/2011 Document Reviewed: 11/07/2010 [facility] Patient Information 2015 [place], [place]. This information is not intended to replace advice given to you by your health care provider. Make sure you discuss any questions you have with your health care provider.

## 2014-11-20 NOTE — ED Notes (Signed)
Pt. reports injury to right wrist with pain  this evening , accidentally hit the door at home  when the dog pulled on the leash .

## 2014-11-21 ENCOUNTER — Other Ambulatory Visit: Payer: Self-pay | Admitting: *Deleted

## 2014-11-21 ENCOUNTER — Telehealth: Payer: Self-pay | Admitting: Family Medicine

## 2014-11-21 MED ORDER — TRAMADOL HCL 50 MG PO TABS: 50.0000 mg | ORAL_TABLET | Freq: Two times a day (BID) | ORAL | Status: DC | PRN

## 2014-11-21 NOTE — Telephone Encounter (Signed)
Patient called regarding his tramadol. I seen that one was printed up. Will that be sent in to pharmacy or will he need to pick up.

## 2014-11-21 NOTE — Telephone Encounter (Signed)
Faxed into pharmacy.

## 2014-11-27 ENCOUNTER — Ambulatory Visit: Payer: No Typology Code available for payment source | Admitting: Family Medicine

## 2014-12-10 ENCOUNTER — Other Ambulatory Visit: Payer: Self-pay

## 2015-01-01 ENCOUNTER — Encounter: Payer: Self-pay | Admitting: Internal Medicine

## 2015-01-04 ENCOUNTER — Ambulatory Visit (INDEPENDENT_AMBULATORY_CARE_PROVIDER_SITE_OTHER): Payer: No Typology Code available for payment source | Admitting: Internal Medicine

## 2015-01-04 ENCOUNTER — Other Ambulatory Visit (INDEPENDENT_AMBULATORY_CARE_PROVIDER_SITE_OTHER): Payer: No Typology Code available for payment source

## 2015-01-04 ENCOUNTER — Ambulatory Visit: Payer: No Typology Code available for payment source | Admitting: Internal Medicine

## 2015-01-04 VITALS — BP 122/82 | HR 100 | Temp 98.5°F | Ht 69.0 in | Wt 246.0 lb

## 2015-01-04 DIAGNOSIS — K50819 Crohn's disease of both small and large intestine with unspecified complications: Secondary | ICD-10-CM

## 2015-01-04 DIAGNOSIS — G4733 Obstructive sleep apnea (adult) (pediatric): Secondary | ICD-10-CM | POA: Diagnosis not present

## 2015-01-04 DIAGNOSIS — R1012 Left upper quadrant pain: Secondary | ICD-10-CM

## 2015-01-04 LAB — CBC WITH DIFFERENTIAL/PLATELET
BASOS PCT: 0.6 % (ref 0.0–3.0)
Basophils Absolute: 0.1 10*3/uL (ref 0.0–0.1)
Eosinophils Absolute: 0.1 10*3/uL (ref 0.0–0.7)
Eosinophils Relative: 0.7 % (ref 0.0–5.0)
HCT: 45.6 % (ref 39.0–52.0)
Hemoglobin: 15.2 g/dL (ref 13.0–17.0)
LYMPHS ABS: 4.1 10*3/uL — AB (ref 0.7–4.0)
Lymphocytes Relative: 33.4 % (ref 12.0–46.0)
MCHC: 33.3 g/dL (ref 30.0–36.0)
MCV: 96.4 fl (ref 78.0–100.0)
Monocytes Absolute: 1.2 10*3/uL — ABNORMAL HIGH (ref 0.1–1.0)
Monocytes Relative: 9.6 % (ref 3.0–12.0)
NEUTROS ABS: 6.8 10*3/uL (ref 1.4–7.7)
NEUTROS PCT: 55.7 % (ref 43.0–77.0)
Platelets: 268 10*3/uL (ref 150.0–400.0)
RBC: 4.73 Mil/uL (ref 4.22–5.81)
RDW: 15.4 % (ref 11.5–15.5)
WBC: 12.3 10*3/uL — ABNORMAL HIGH (ref 4.0–10.5)

## 2015-01-04 MED ORDER — TRAMADOL HCL 50 MG PO TABS: 50.0000 mg | ORAL_TABLET | Freq: Four times a day (QID) | ORAL | Status: DC | PRN

## 2015-01-04 NOTE — Progress Notes (Signed)
   Subjective:    Patient ID: [name] [name], male    DOB: 05-28-1976, 39 y.o.   MRN: [id number]  HPI Symptoms began 3 days ago as pain in the left upper quadrant under the rib cage. Symptoms have progressed. He describes sharp pain intermittently. It has been worse turning over in bed; getting off his couch; or flexing at the waist. There is no associated spine or radicular pain. It is not aggravated by deep breathing and not associated with any cough, dyspnea or hemoptysis.No rash or vesicles visualized.  He has Crohn's disease and is on Humira. He has noted blood intermittently in his  stool related to the Crohn's.  He also has a history of sleep apnea. "I lost my CPAP machine in my divorce".   Review of Systems Unexplained weight loss, significant dyspepsia, dysphagia, melena, or persistently small caliber stools are denied.  Dysuria, pyuria, hematuria, frequency, nocturia or polyuria are denied.     Objective:   Physical Exam Pertinent or positive findings include: There is severe crowding of the oropharynx with erythema. No exudate is present. He exhibits a resting tach. He's tender in the left upper quadrant as well as epigastrium to palpation. Has a [place] tattoo over the lumbosacral area. The left great toenail has artwork. The right great toenail is missing.  General appearance :adequately nourished; in no distress.BMI: 36.31.  Eyes: No conjunctival inflammation or scleral icterus is present.  Oral exam:  Lips and gums are healthy appearing. Dental hygiene is good.  Heart:  regular rhythm. S1 and S2 normal without gallop, murmur, click, rub or other extra sounds    Lungs:Chest clear to auscultation; no wheezes, rhonchi,rales ,or rubs present.No increased work of breathing.   Abdomen: Protuberant;bowel sounds normal, soft  without masses, organomegaly or hernias noted.  No guarding or rebound. No flank tenderness to percussion.  Vascular : all pulses equal ; no bruits  present.  Skin:Warm & dry.  Intact without suspicious lesions or rashes ; no tenting or jaundice.   Lymphatic: No lymphadenopathy is noted about the head, neck, axilla   Neuro: Strength, tone & DTRs normal.       Assessment & Plan:  #1 left upper quadrant and epigastric abdominal pain  #2 Crohn's disease with intermittent rectal bleeding  #3 obstructive sleep apnea, untreated  Plan: See orders and recommendations

## 2015-01-04 NOTE — Patient Instructions (Addendum)
Avoid the "aspirin family" ; alcohol; peppermint; and caffeine (coffee, tea, cola, and chocolate). The aspirin family would include aspirin and the nonsteroidal agents such as ibuprofen &  Naproxen,not Tylenol .Food & drink should be avoided for @ least 2 hours before going to bed.  Stay on clear liquids for 48-72 hours or until pain resolved.This would include  jello, sherbert (NOT ice cream), Lipton's chicken noodle soup(NOT cream based soups),Gatorade Lite, flat Ginger ale (without High Fructose Corn Syrup),dry toast or crackers, baked potato.No milk , dairy or grease until well. To ER for increasing pain, fever or rectal bleeding  Take the tramadol every six hours as needed.Stop Paroxetine while on this

## 2015-01-04 NOTE — Progress Notes (Signed)
Pre visit review using our clinic review tool, if applicable. No additional management support is needed unless otherwise documented below in the visit note. 

## 2015-01-05 ENCOUNTER — Encounter: Payer: Self-pay | Admitting: Internal Medicine

## 2015-01-06 ENCOUNTER — Encounter: Payer: Self-pay | Admitting: Internal Medicine

## 2015-01-07 ENCOUNTER — Other Ambulatory Visit: Payer: Self-pay | Admitting: Internal Medicine

## 2015-01-07 ENCOUNTER — Telehealth: Payer: Self-pay | Admitting: Internal Medicine

## 2015-01-07 DIAGNOSIS — R1012 Left upper quadrant pain: Secondary | ICD-10-CM

## 2015-01-07 DIAGNOSIS — K50819 Crohn's disease of both small and large intestine with unspecified complications: Secondary | ICD-10-CM

## 2015-01-07 LAB — LIPASE: Lipase: 33 U/L (ref 11.0–59.0)

## 2015-01-07 LAB — AMYLASE: Amylase: 38 U/L (ref 27–131)

## 2015-01-07 NOTE — Telephone Encounter (Signed)
He was seen 01/04/15 as last patient. He presented with abdominal pain; these labs were reported 01/07/15 at 11:43 am. I had looked for them throughout the weekend  Chest pain or abdominal pain patients should not be scheduled late Friday afternoon for optimal care and continuity.

## 2015-01-07 NOTE — Telephone Encounter (Signed)
Dr Linna Darner

## 2015-01-07 NOTE — Telephone Encounter (Signed)
Should this pt, given the results of the labs that were ordered need to be sent to the ER with current and persistant sx?

## 2015-01-07 NOTE — Telephone Encounter (Signed)
Per lead physician advisement:  LVM for pt to call back as soon as possible.  RE: instructed pt that it is likely a GI virus and to modify diet to reduce sx.

## 2015-01-07 NOTE — Telephone Encounter (Signed)
He was not unstable but I expected labs back within 12 hours. Critical labs should have been called to PCP on call. If we can not get labs back ; such patients should not be scheduled late Fri afternoon

## 2015-01-07 NOTE — Telephone Encounter (Signed)
I see that MD responded to pt via my chart message. I am closing this phone note.

## 2015-01-07 NOTE — Telephone Encounter (Signed)
Patient Name: [name] [name] DOB: [birth year] Initial Comment caller states he saw the dr on Friday - is still having abd pain Nurse Assessment Nurse: [name] Deist, RN, [name] [name] Date/Time (Eastern Time): 01/07/2015 10:57:08 AM Confirm and document reason for call. If symptomatic, describe symptoms. ---Caller states he saw the Dr. on Friday. Wasn't sure what was going on, took blood. He is still having abdominal pain, under the ribs on left side, an "8" on pain scale of 1-10 that comes & goes. Vomiting every day. No fever. According to My Chart, results are not back yet. Has been on a clear diet. Has the patient traveled out of the country within the last 30 days? ---No Does the patient require triage? ---Yes Related visit to physician within the last 2 weeks? ---Yes Does the PT have any chronic conditions? (i.e. diabetes, asthma, etc.) ---Yes List chronic conditions. ---Crohn's Guidelines Guideline Title Affirmed Question Affirmed Notes Abdominal Pain - Upper [1] MODERATE pain (e.g., interferes with normal activities) AND [2] comes and goes (cramps) AND [3] present > 24 hours (Exception: pain with Vomiting or Diarrhea - see that Guideline) Final Disposition User See Physician within [place], RN, [name] [name] would like some feedback from his provider about what to do now, whether labs are back yet. he is particularly interested in what his Lipase level is. At this time, does not want nurse to schedule him an appt. please contact patient at this #. He was told on Friday that if his pain became worse, to be seen in the ER. The pain has not worsened, but it has not gone away, sometimes occurring as frequently as every 10 mins. Referrals REFERRED TO PCP OFFICE Disagree/Comply: Comply

## 2015-01-07 NOTE — Telephone Encounter (Signed)
Will route this to Dr. Linna Darner who saw him last week

## 2015-01-07 NOTE — Telephone Encounter (Signed)
Results are back but not resulted. Can you result and possibly give advise on what pt should do.   Overview of content: abd pain 8/10. Vomiting daily. Labs done on Friday. Lipase was normal. WBC elevated.

## 2015-01-08 ENCOUNTER — Other Ambulatory Visit (INDEPENDENT_AMBULATORY_CARE_PROVIDER_SITE_OTHER): Payer: No Typology Code available for payment source

## 2015-01-08 ENCOUNTER — Other Ambulatory Visit: Payer: Self-pay | Admitting: Internal Medicine

## 2015-01-08 DIAGNOSIS — R1012 Left upper quadrant pain: Secondary | ICD-10-CM

## 2015-01-08 DIAGNOSIS — R110 Nausea: Secondary | ICD-10-CM

## 2015-01-08 DIAGNOSIS — R1084 Generalized abdominal pain: Secondary | ICD-10-CM

## 2015-01-08 DIAGNOSIS — K50818 Crohn's disease of both small and large intestine with other complication: Secondary | ICD-10-CM

## 2015-01-08 LAB — BASIC METABOLIC PANEL
BUN: 14 mg/dL (ref 6–23)
CHLORIDE: 98 meq/L (ref 96–112)
CO2: 30 mEq/L (ref 19–32)
Calcium: 8.7 mg/dL (ref 8.4–10.5)
Creatinine, Ser: 0.93 mg/dL (ref 0.40–1.50)
GFR: 95.72 mL/min (ref >60.00)
GLUCOSE: 101 mg/dL — AB (ref 70–99)
Potassium: 3.8 mEq/L (ref 3.5–5.1)
Sodium: 136 mEq/L (ref 135–145)

## 2015-01-08 MED ORDER — ONDANSETRON HCL 4 MG PO TABS: 4.0000 mg | ORAL_TABLET | Freq: Three times a day (TID) | ORAL | Status: DC | PRN

## 2015-01-08 NOTE — Telephone Encounter (Signed)
Pt called in said that he has several questions and would like for you to call him when you get a chance .

## 2015-01-08 NOTE — Telephone Encounter (Signed)
Spoke to pt. Pt sent hopper a my chart message. I have forwarded this to him for review and advisement.

## 2015-01-10 ENCOUNTER — Encounter: Payer: Self-pay | Admitting: Internal Medicine

## 2015-01-10 ENCOUNTER — Ambulatory Visit (INDEPENDENT_AMBULATORY_CARE_PROVIDER_SITE_OTHER)
Admission: RE | Admit: 2015-01-10 | Discharge: 2015-01-10 | Disposition: A | Discharge status: Home / Self Care | Payer: No Typology Code available for payment source | Source: Ambulatory Visit | Attending: Internal Medicine | Admitting: Internal Medicine

## 2015-01-10 ENCOUNTER — Ambulatory Visit (INDEPENDENT_AMBULATORY_CARE_PROVIDER_SITE_OTHER): Payer: No Typology Code available for payment source | Admitting: Internal Medicine

## 2015-01-10 VITALS — BP 110/74 | HR 76 | Ht 65.0 in | Wt 250.4 lb

## 2015-01-10 DIAGNOSIS — K5080 Crohn's disease of both small and large intestine without complications: Secondary | ICD-10-CM

## 2015-01-10 DIAGNOSIS — K50819 Crohn's disease of both small and large intestine with unspecified complications: Secondary | ICD-10-CM | POA: Diagnosis not present

## 2015-01-10 DIAGNOSIS — R1012 Left upper quadrant pain: Secondary | ICD-10-CM | POA: Diagnosis not present

## 2015-01-10 DIAGNOSIS — R0781 Pleurodynia: Secondary | ICD-10-CM

## 2015-01-10 MED ORDER — TRAMADOL HCL 50 MG PO TABS: 100.0000 mg | ORAL_TABLET | Freq: Four times a day (QID) | ORAL | Status: DC | PRN

## 2015-01-10 MED ORDER — DICLOFENAC SODIUM 1 % TD GEL: 4.0000 g | Freq: Four times a day (QID) | TRANSDERMAL | Status: DC

## 2015-01-10 MED ORDER — IOHEXOL 300 MG/ML  SOLN
100.0000 mL | Freq: Once | INTRAMUSCULAR | Status: AC | PRN
  Administered 2015-01-10: 100 mL via INTRAVENOUS

## 2015-01-10 MED ORDER — METHOCARBAMOL 500 MG PO TABS: 1000.0000 mg | ORAL_TABLET | Freq: Four times a day (QID) | ORAL | Status: DC | PRN

## 2015-01-10 NOTE — Assessment & Plan Note (Addendum)
This seems stable and I don't think his current symptoms have any relationship to his Crohn's. He will continue Humira and I would like to see him routinely in 4-6 months.

## 2015-01-10 NOTE — Progress Notes (Addendum)
   Subjective:    Patient ID: [name] [name], male    DOB: [birth year], 39 y.o.   MRN: [id number] Chief complaint: Left upper quadrant pain HPI Tenderness is here with complaints of left upper quadrant pain. He has been seen in internal medicine in April and then this month. He does not recall any injury. It hurts to twist in move and apply pressure. Bowel habits are fairly regular there is occasional rectal bleeding this is all stable. He saw Dr. Linna Darner last week or so, he had labs which showed a slightly elevated white count but a normal hemoglobin. Amylase and lipase were normal. He was prescribed tramadol 50 mg and Pearl says that is not helping the pain much. CT scan of the abdomen and pelvis was performed today and is unrevealing. There is no significant Crohn's activity per CT in no abnormality to correlate with his pain. Eating is not affected.  Review of Systems As per history of present illness    Objective:   Physical Exam @BP  110/74 mmHg  Pulse 76  Ht 5' 5"  (1.651 m)  Wt 250 lb 6.4 oz (113.581 kg)  BMI 41.67 kg/m2@  General:  NAD Eyes:   anicteric Lungs:  clear  no decreased breath sounds or signs of pleural effusion Heart:: S1S2 no rubs, murmurs or gallops Abdomen:  soft and nontender, BS+ the left lower anterior chest wall and ribs are tender however.   Data Reviewed:  As per history of present illness.     Assessment & Plan:  CROHN'S DISEASE, LARGE AND SMALL INTESTINES This seems stable and I don't think his current symptoms have any relationship to his Crohn's. He will continue Humira and I would like to see him routinely in 4-6 months.  Rib pain on left side Increase tramadol 100 mg typically at bedtime. Diclofenac gel, apply 4 g 4 times a day to the affected area. He is to brace this area when he coughs sneezes and avoid twisting etc. Is not better in a month he should let me know.    Message back to me after office visit - My Chart  I have applied the gel.  Other than a slight tingle, no real relief. I have also taken 2 of the ultram since I left the office and I can't tell any difference between the one or two. My side is still having stabbing pains. I did come on to work today. But it's almost intolerable. Are you able to prescribe something that would give me enough relief to get some rest and be comfortable for just a bit. This has been going on since Wednesday last week?    My response  I need you to keep trying the gel and the higher dose of tramadol for now  The gel takes a day or 2 at least to kick in   I will send in a muscle relaxer - it should help you sleep   Will Rx Robaxin I am concerned he is seeking narcotics  Next step may be lidoderm patch

## 2015-01-10 NOTE — Assessment & Plan Note (Signed)
Increase tramadol 100 mg typically at bedtime. Diclofenac gel, apply 4 g 4 times a day to the affected area. He is to brace this area when he coughs sneezes and avoid twisting etc. Is not better in a month he should let me know.

## 2015-01-10 NOTE — Addendum Note (Signed)
Addended by: Gatha Mayer on: 01/10/2015 03:43 PM   Modules accepted: Orders

## 2015-01-10 NOTE — Patient Instructions (Addendum)
   Please start the diclofenac gel and use 100 mg tramadol. Brace the abdomen and ribs if and when you cough. If still the same after 1 month let me know.  See me routinely in 4-6 months  I appreciate the opportunity to care for you. Gatha Mayer, MD, [facility]

## 2015-01-17 ENCOUNTER — Encounter: Payer: Self-pay | Admitting: Internal Medicine

## 2015-01-17 ENCOUNTER — Ambulatory Visit (INDEPENDENT_AMBULATORY_CARE_PROVIDER_SITE_OTHER): Payer: No Typology Code available for payment source | Admitting: Internal Medicine

## 2015-01-17 VITALS — BP 110/74 | HR 82 | Temp 97.8°F | Ht 69.0 in | Wt 244.0 lb

## 2015-01-17 DIAGNOSIS — R1012 Left upper quadrant pain: Secondary | ICD-10-CM | POA: Diagnosis not present

## 2015-01-17 MED ORDER — PANTOPRAZOLE SODIUM 40 MG PO TBEC: 40.0000 mg | DELAYED_RELEASE_TABLET | Freq: Every day | ORAL | Status: DC

## 2015-01-17 MED ORDER — ACETAMINOPHEN-CODEINE 300-30 MG PO TABS: 1.0000 | ORAL_TABLET | Freq: Four times a day (QID) | ORAL | Status: DC | PRN

## 2015-01-17 NOTE — Assessment & Plan Note (Signed)
And epigastric tender, for trial PPI, also tylenol #3 prn, hold on further imaging or labs,  to f/u any worsening symptoms or concerns

## 2015-01-17 NOTE — Progress Notes (Signed)
Subjective:    Patient ID: [name] [name], male    DOB: [birth year], 39 y.o.   MRN: [id number]    HPI   Here to f/u LUQ pain, c/w MSK per GI, recent CT neg but tramadol/robaxin not helping.  Pain persists, dull and sharp, moderate, constant, worse to bend forward, no radiation, and not assoc with n/v, bowel change, fever or blood. Pt denies chest pain, increased sob or doe, wheezing, orthopnea, PND, increased LE swelling, palpitations, dizziness or syncope.   Pt denies polydipsia, polyuria,  Past Medical History  Diagnosis Date  . Obstructive sleep apnea (adult) (pediatric)   . Anxiety and depression   . Essential hypertension, benign   . Obesity, unspecified   . Herpes zoster infection 07/2010, 12/2010    with post-herpetic neuralgia  . Vitamin D deficiency   . Crohn's disease   . Hyperlipidemia   . CMV (cytomegalovirus infection) 10/28/2010  . Nephrolithiasis   . Fistula, perirectal   . Sinus infection 08/05/11  . Depression   . Syncope   . Pyelonephritis 2015  . Plantar fasciitis of right foot 08/22/2013  . Tinea pedis 06/27/2013  . Anal fissure - posterior 02/15/2014   Past Surgical History  Procedure Laterality Date  . Small intestine surgery  08/05/07    1 ft removed, ileo-cecectomy  . Hypospadias correction      Childhood   . Upper gastrointestinal endoscopy  10/15/2010    w/biopsy, mild gastritis and duodenitis  . Colonoscopy  10/14/2007    crohn's colitis, aphthous ulcers, mild anal stenosis  . Colon resection    . Tooth extraction      reports that he has never smoked. He has never used smokeless tobacco. He reports that he drinks about 1.8 oz of alcohol per week. He reports that he does not use illicit drugs. family history includes Allergies in his mother and sister; Asthma in his sister; Cancer in his mother; Gout in his father; Heart disease in his father; Hyperlipidemia in his father; Hypertension in his father; Obesity in his mother; Sleep apnea in his father; Stroke in  his maternal grandfather. There is no history of Colon cancer, Diabetes, or COPD. No Known Allergies Current Outpatient Prescriptions on File Prior to Visit  Medication Sig Dispense Refill  . Adalimumab 40 MG/0.8ML PNKT Inject 40 mg into the skin every 14 (fourteen) days.    Marland Kitchen alprazolam (XANAX) 2 MG tablet Take 0.5 tablets (1 mg total) by mouth 2 (two) times daily. 30 tablet 5  . colestipol (COLESTID) 1 G tablet Take 4 tablets (4 g total) by mouth daily with supper. 120 tablet 3  . diclofenac sodium (VOLTAREN) 1 % GEL Apply 4 g topically 4 (four) times daily. To left rib area 300 g 0  . diphenoxylate-atropine (LOMOTIL) 2.5-0.025 MG per tablet Take 1 tablet by mouth 4 (four) times daily as needed for diarrhea or loose stools. (Patient taking differently: Take 1 tablet by mouth daily. ) 90 tablet 0  . emtricitabine-tenofovir (TRUVADA) 200-300 MG per tablet Take 1 tablet by mouth daily. 90 tablet 3  . lisinopril-hydrochlorothiazide (PRINZIDE,ZESTORETIC) 20-25 MG per tablet Take 1 tablet by mouth daily. 30 tablet 5  . methocarbamol (ROBAXIN) 500 MG tablet Take 2 tablets (1,000 mg total) by mouth 4 (four) times daily as needed for muscle spasms. 90 tablet 0  . mirtazapine (REMERON) 15 MG tablet Take 1 tablet (15 mg total) by mouth at bedtime. 30 tablet 5  . ondansetron (ZOFRAN) 4 MG tablet  Take 1 tablet (4 mg total) by mouth every 8 (eight) hours as needed for nausea or vomiting. 15 tablet 0  . PARoxetine (PAXIL) 20 MG tablet Take 2 tablets (40 mg total) by mouth daily. 60 tablet 5  . traMADol (ULTRAM) 50 MG tablet Take 2 tablets (100 mg total) by mouth every 6 (six) hours as needed. 30 tablet 0   No current facility-administered medications on file prior to visit.    Review of Systems  Constitutional: Negative for unusual diaphoresis or night sweats HENT: Negative for ringing in ear or discharge Eyes: Negative for double vision or worsening visual disturbance.  Respiratory: Negative for choking  and stridor.   Gastrointestinal: Negative for vomiting or other signifcant bowel change Genitourinary: Negative for hematuria or change in urine volume.  Musculoskeletal: Negative for other MSK pain or swelling Skin: Negative for color change and worsening wound.  Neurological: Negative for tremors and numbness other than noted  Psychiatric/Behavioral: Negative for decreased concentration or agitation other than above       Objective:   Physical Exam BP 110/74 mmHg  Pulse 82  Temp(Src) 97.8 F (36.6 C) (Oral)  Ht 5' 9"  (1.753 m)  Wt 244 lb (110.678 kg)  BMI 36.02 kg/m2  SpO2 96% VS noted,  Constitutional: Pt appears in no significant distress HENT: Head: NCAT.  Right Ear: External ear normal.  Left Ear: External ear normal.  Eyes: . Pupils are equal, round, and reactive to light. Conjunctivae and EOM are normal Neck: Normal range of motion. Neck supple.  Cardiovascular: Normal rate and regular rhythm.   Pulmonary/Chest: Effort normal and breath sounds without rales or wheezing.  Abd:  Soft,, ND, + BS with tender epigastric and left costal margin without skin change or rash or swelling Neurological: Pt is alert. Not confused , motor grossly intact Skin: Skin is warm. No rash, no LE edema Psychiatric: Pt behavior is normal. No agitation.  CLINICAL DATA: Patient with left upper quadrant, epigastric and overall diffuse abdominal pain. Patient with regional enteritis, history of Crohn's.  EXAM: CT ABDOMEN AND PELVIS WITH CONTRAST  TECHNIQUE: Multidetector CT imaging of the abdomen and pelvis was performed using the standard protocol following bolus administration of intravenous contrast.  CONTRAST: 126m OMNIPAQUE IOHEXOL 300 MG/ML SOLN  COMPARISON: CT abdomen pelvis 08/07/2014  FINDINGS: Lower chest: Dependent atelectasis within the bilateral lower lobes. No pleural effusion. Normal heart size.  Hepatobiliary: Liver is diffusely low in attenuation compatible  with steatosis. No focal hepatic lesion is identified. Gallbladder is decompressed. No intrahepatic or extrahepatic biliary ductal dilatation.  Pancreas: Unremarkable  Spleen: Unremarkable  Adrenals/Urinary Tract: Normal adrenal glands. Kidneys enhance symmetrically with contrast. Stable sub cm partially exophytic low-attenuation lesion interpolar region left kidney. No hydronephrosis. Urinary bladder is unremarkable.  Stomach/Bowel: Oral contrast material is demonstrated throughout the small bowel. No evidence for bowel obstruction. Postoperative changes compatible with right hemicolectomy.  Vascular/Lymphatic: Normal caliber abdominal aorta. No retroperitoneal lymphadenopathy. Stable 1.1 cm mesenteric lymph node (image 48; series 2).  Other: Central dystrophic calcifications in the prostate. Re- demonstrated fat containing ventral abdominal wall hernias.  Musculoskeletal: No aggressive or acute appearing osseous lesions.  IMPRESSION: Stable postsurgical changes at the level of ascending colon. No abnormal bowel wall thickening or evidence for bowel obstruction. No evidence for acute bowel pathology.  Hepatic steatosis.  Multiple fat containing ventral abdominal wall hernias.   Electronically Signed  By: DLovey NewcomerM.D.  On: 01/10/2015 09:43    Assessment & Plan:

## 2015-01-17 NOTE — Progress Notes (Signed)
Pre visit review using our clinic review tool, if applicable. No additional management support is needed unless otherwise documented below in the visit note. 

## 2015-01-17 NOTE — Patient Instructions (Signed)
Please take all new medication as prescribed - the protonix to try, as well as the pain medication  Please continue all other medications as before, and refills have been done if requested.  Please have the pharmacy call with any other refills you may need.  Please continue your efforts at being more active, low cholesterol diet, and weight control.  Please keep your appointments with your specialists as you may have planned

## 2015-01-23 ENCOUNTER — Encounter: Payer: Self-pay | Admitting: Internal Medicine

## 2015-02-04 ENCOUNTER — Other Ambulatory Visit: Payer: Self-pay | Admitting: Internal Medicine

## 2015-02-04 ENCOUNTER — Encounter: Payer: Self-pay | Admitting: Internal Medicine

## 2015-02-04 DIAGNOSIS — R110 Nausea: Secondary | ICD-10-CM

## 2015-02-04 MED ORDER — ONDANSETRON HCL 4 MG PO TABS: 4.0000 mg | ORAL_TABLET | Freq: Three times a day (TID) | ORAL | Status: DC | PRN

## 2015-02-04 NOTE — Addendum Note (Signed)
Addended by: Lowella Dandy on: 02/04/2015 11:45 AM   Modules accepted: Orders

## 2015-02-05 MED ORDER — ALPRAZOLAM ER 3 MG PO TB24: 3.0000 mg | ORAL_TABLET | ORAL | Status: DC

## 2015-02-05 NOTE — Telephone Encounter (Signed)
Ok to try, Done hardcopy to [place], but be aware insurance may not pay for this since it is extended release

## 2015-02-08 ENCOUNTER — Encounter: Payer: Self-pay | Admitting: Internal Medicine

## 2015-02-08 MED ORDER — ADALIMUMAB 40 MG/0.8ML ~~LOC~~ AJKT: 40.0000 mg | AUTO-INJECTOR | SUBCUTANEOUS | Status: DC

## 2015-02-12 ENCOUNTER — Telehealth: Payer: Self-pay | Admitting: Internal Medicine

## 2015-02-12 NOTE — Telephone Encounter (Signed)
Prior auth submitted to [facility].  Patient aware

## 2015-02-21 ENCOUNTER — Other Ambulatory Visit: Payer: Self-pay | Admitting: Internal Medicine

## 2015-02-21 NOTE — Telephone Encounter (Signed)
May I refill Sir?

## 2015-02-21 NOTE — Telephone Encounter (Signed)
Ok to refill Sir?

## 2015-02-22 ENCOUNTER — Other Ambulatory Visit: Payer: Self-pay | Admitting: Internal Medicine

## 2015-02-22 ENCOUNTER — Other Ambulatory Visit: Payer: Self-pay

## 2015-02-22 MED ORDER — MIRTAZAPINE 15 MG PO TABS: 15.0000 mg | ORAL_TABLET | Freq: Every day | ORAL | Status: DC

## 2015-02-22 NOTE — Telephone Encounter (Signed)
Refills x 6

## 2015-03-03 ENCOUNTER — Ambulatory Visit (INDEPENDENT_AMBULATORY_CARE_PROVIDER_SITE_OTHER): Payer: No Typology Code available for payment source | Admitting: Physician Assistant

## 2015-03-03 VITALS — BP 116/60 | HR 107 | Temp 98.3°F | Resp 16 | Ht 68.5 in | Wt 250.0 lb

## 2015-03-03 DIAGNOSIS — H9201 Otalgia, right ear: Secondary | ICD-10-CM | POA: Diagnosis not present

## 2015-03-03 NOTE — Progress Notes (Signed)
   Subjective:    Patient ID: [name] [name], male    DOB: [birth year], 39 y.o.   MRN: [id number]  HPI Patient presents for right sided ear pain that has been present for 1 day following cleaning ears with Q-tips. Got a lot of wax out so cleaned a little more vigorously. Also put some peroxide onto Q-tip and heard rice crispy sound in ears. Endorses rust colored drainage that evening. Denies tinnitus, decreased hearing, current drainage, or URI sx. Denies swimming in pool or lake recently. NKDA.   Review of Systems  Constitutional: Negative for fever and chills.  HENT: Positive for ear discharge and ear pain. Negative for congestion, rhinorrhea and sore throat.   Eyes: Negative for pain, discharge and visual disturbance.  Respiratory: Negative for cough.   Neurological: Negative for dizziness, light-headedness and headaches.       Objective:   Physical Exam  Constitutional: He is oriented to person, place, and time. He appears well-developed and well-nourished. No distress.  Blood pressure 116/60, pulse 107, temperature 98.3 F (36.8 C), temperature source Oral, resp. rate 16, height 5' 8.5" (1.74 m), weight 250 lb (113.399 kg), SpO2 98 %.  HENT:  Head: Normocephalic and atraumatic.  Right Ear: Tympanic membrane, external ear and ear canal normal. No lacerations. No drainage, swelling or tenderness. No foreign bodies. Tympanic membrane is not injected, not scarred, not perforated, not erythematous, not retracted and not bulging. No middle ear effusion. No hemotympanum.  Left Ear: Tympanic membrane and ear canal normal. No lacerations. No drainage, swelling or tenderness. No foreign bodies. Tympanic membrane is not injected, not scarred, not perforated, not erythematous, not retracted and not bulging.  No middle ear effusion. No hemotympanum.  Nose: Nose normal.  Mouth/Throat: Oropharynx is clear and moist and mucous membranes are normal. No oropharyngeal exudate.  Eyes: Conjunctivae are  normal. Right eye exhibits no discharge. Left eye exhibits no discharge. No scleral icterus.  Pulmonary/Chest: Effort normal.  Neurological: He is alert and oriented to person, place, and time.  Skin: He is not diaphoretic.  Psychiatric: He has a normal mood and affect. His behavior is normal. Judgment and thought content normal.      Assessment & Plan:  1. Ear pain, right Ear exam was benign. May of had some bleeding from vigorous cleaning. Advised to discontinue use of Q-tips.    Alveta Heimlich [name]  [facility] 03/03/2015 3:08 PM

## 2015-03-18 ENCOUNTER — Ambulatory Visit (INDEPENDENT_AMBULATORY_CARE_PROVIDER_SITE_OTHER): Payer: No Typology Code available for payment source | Admitting: Internal Medicine

## 2015-03-18 DIAGNOSIS — K50819 Crohn's disease of both small and large intestine with unspecified complications: Secondary | ICD-10-CM

## 2015-03-20 LAB — TB SKIN TEST: TB SKIN TEST: NEGATIVE

## 2015-03-29 ENCOUNTER — Encounter: Payer: Self-pay | Admitting: Internal Medicine

## 2015-04-04 ENCOUNTER — Encounter: Payer: Self-pay | Admitting: Internal Medicine

## 2015-04-04 ENCOUNTER — Telehealth: Payer: Self-pay | Admitting: Internal Medicine

## 2015-04-04 ENCOUNTER — Institutional Professional Consult (permissible substitution): Payer: No Typology Code available for payment source | Admitting: Pulmonary Disease

## 2015-04-04 MED ORDER — ALPRAZOLAM 2 MG PO TABS: ORAL_TABLET | ORAL | Status: DC

## 2015-04-04 NOTE — Telephone Encounter (Signed)
Done Done hardcopy to [facility]

## 2015-04-05 NOTE — Telephone Encounter (Signed)
Rx faxed to pharmacy  

## 2015-04-18 ENCOUNTER — Other Ambulatory Visit: Payer: Self-pay | Admitting: Internal Medicine

## 2015-04-22 ENCOUNTER — Encounter: Payer: Self-pay | Admitting: Internal Medicine

## 2015-04-22 ENCOUNTER — Telehealth: Payer: Self-pay | Admitting: Internal Medicine

## 2015-04-22 NOTE — Telephone Encounter (Signed)
Insurance is not active until tomorrow.  I called and explained to the patient I will try again tomorrow to do prior auth.  They will not start today as he is not active

## 2015-04-22 NOTE — Telephone Encounter (Signed)
Patient has new ins and needs prior auth for [employer].  He will fax copies of his new cards

## 2015-04-23 ENCOUNTER — Encounter: Payer: Self-pay | Admitting: Internal Medicine

## 2015-04-23 ENCOUNTER — Ambulatory Visit (INDEPENDENT_AMBULATORY_CARE_PROVIDER_SITE_OTHER): Payer: BLUE CROSS/BLUE SHIELD | Admitting: Internal Medicine

## 2015-04-23 VITALS — BP 102/76 | HR 133 | Temp 100.2°F | Resp 18 | Wt 246.0 lb

## 2015-04-23 DIAGNOSIS — J020 Streptococcal pharyngitis: Secondary | ICD-10-CM

## 2015-04-23 MED ORDER — CEFPODOXIME PROXETIL 200 MG PO TABS: 200.0000 mg | ORAL_TABLET | Freq: Two times a day (BID) | ORAL | Status: DC

## 2015-04-23 MED ORDER — CEFTRIAXONE SODIUM 1 G IJ SOLR
1.0000 g | Freq: Once | INTRAMUSCULAR | Status: AC
  Administered 2015-04-23: 1 g via INTRAMUSCULAR

## 2015-04-23 NOTE — Progress Notes (Signed)
Subjective:    Patient ID: [name] [name], male    DOB: 1975-12-22, 39 y.o.   MRN: [id number]  HPI  He is here for cold symptoms. His symptoms started yesterday.  He has fever, chills, sweating, sore throat, congestion, sinus pressure, headaches, nausea/vomiting, muscle aches and he overall just feels miserable.  He was in contact with his partner's son who had strep, but he was already on antibiotics.  He took advil yesterday and it helped, but he has crohn's and should not take it.  He is having difficulty eating, drinking and swallowing his pills because of throat pain/swelling and the nausea/vomiting.     Medications and allergies reviewed with patient and updated if appropriate.  Patient Active Problem List   Diagnosis Date Noted  . LUQ abdominal pain 01/17/2015  . Right wrist pain 11/02/2014  . Ganglion cyst of flexor tendon sheath 11/02/2014  . Rib pain on left side 08/07/2014  . Tinea pedis 06/27/2013  . Immunosuppression ([place]) 10/14/2010  . Obesity, unspecified 07/20/2007  . Anxiety disorder 07/20/2007  . DEPRESSION 07/20/2007  . Obstructive sleep apnea 07/20/2007  . Essential hypertension, benign 07/20/2007  . CROHN'S DISEASE, LARGE AND SMALL INTESTINES 07/20/2007    Past Medical History  Diagnosis Date  . Obstructive sleep apnea (adult) (pediatric)   . Anxiety and depression   . Essential hypertension, benign   . Obesity, unspecified   . Herpes zoster infection 07/2010, 12/2010    with post-herpetic neuralgia  . Vitamin D deficiency   . Crohn's disease ([place])   . Hyperlipidemia   . CMV (cytomegalovirus infection) ([place]) 10/28/2010  . Nephrolithiasis   . Fistula, perirectal   . Sinus infection 08/05/11  . Depression   . Syncope   . Pyelonephritis 2015  . Plantar fasciitis of right foot 08/22/2013  . Tinea pedis 06/27/2013  . Anal fissure - posterior 02/15/2014    Past Surgical History  Procedure Laterality Date  . Small intestine surgery  08/05/07    1 ft  removed, ileo-cecectomy  . Hypospadias correction      Childhood   . Upper gastrointestinal endoscopy  10/15/2010    w/biopsy, mild gastritis and duodenitis  . Colonoscopy  10/14/2007    crohn's colitis, aphthous ulcers, mild anal stenosis  . Colon resection    . Tooth extraction      Social History   Social History  . Marital Status: Divorced    Spouse Name: N/A  . Number of Children: 2  . Years of Education: 14   Occupational History  . [employer]    Social History Main Topics  . Smoking status: Never Smoker   . Smokeless tobacco: Never Used  . Alcohol Use: 1.8 oz/week    3 Shots of liquor per week     Comment: 3/week  . Drug Use: No  . Sexual Activity: Not Asked   Other Topics Concern  . None   Social History Narrative   HSG, [place] - 2 years. Married '99 - 52yr/divorced. 2 sons - '98, '00 split custody. 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 No history of physical or sexual abuse.     Review of Systems  Constitutional: Positive for fever, chills and fatigue.  HENT: Positive for congestion, sinus pressure and sore throat. Negative for ear pain.   Respiratory: Positive for cough (dry) and shortness of breath (feels short of breath). Negative for wheezing.   Cardiovascular: Positive for chest pain (tightness or uncomfortable). Negative for  palpitations.  Gastrointestinal: Positive for nausea and vomiting.  Genitourinary: Negative for dysuria and hematuria.  Musculoskeletal: Positive for myalgias.  Neurological: Positive for headaches.       Objective:   Filed Vitals:   04/23/15 1052  BP: 102/76  Pulse: 133  Temp: 100.2 F (37.9 C)  Resp: 18   Filed Weights   04/23/15 1052  Weight: 246 lb (111.585 kg)   Body mass index is 36.86 kg/(m^2).   Physical Exam  Constitutional: He appears well-developed and well-nourished.  Appears moderately ill  HENT:  Head: Normocephalic and atraumatic.  Right Ear: External ear normal.  Left Ear:  External ear normal.  Mouth/Throat: No oropharyngeal exudate.  Oropharynx moderately erythematous and slightly swollen  Eyes: Conjunctivae are normal.  Neck: Neck supple. No tracheal deviation present. No thyromegaly present.  Cardiovascular: Regular rhythm and normal heart sounds.   No murmur heard. Tachycardic - 122 on recheck - likely related to fever, strep  Pulmonary/Chest: Effort normal and breath sounds normal. No respiratory distress. He has no wheezes. He has no rales.  Lymphadenopathy:    He has no cervical adenopathy.  Skin: Skin is warm and dry.          Assessment & Plan:   Strep Pharyngitis Rapid strep positive He is having difficulty swallowing and keeping fluids/food and medications down Rocephin 1 gm IM x 1 today, then Vantin 200 mg BID x 5 days starting tomorrow Tylenol according to the package  Rest, fluids  Call if no improvement

## 2015-04-23 NOTE — Telephone Encounter (Signed)
Patient's Humira has been approved through [facility] from 04/23/15-04/22/17.  Patient notified via voicemail that is is approved Appropval number [id number]

## 2015-04-23 NOTE — Patient Instructions (Signed)
You have strep throat.  You received an antibiotic injection here today and an antibiotic was sent to your pharmacy for you to start tomorrow morning.  Take as prescribed and call if you are not feeling better.  Start taking tylenol according to the package.    Strep Throat Strep throat is a bacterial infection of the throat. Your health care provider may call the infection tonsillitis or pharyngitis, depending on whether there is swelling in the tonsils or at the back of the throat. Strep throat is most common during the cold months of the year in children who are 8-59 years of age, but it can happen during any season in people of any age. This infection is spread from person to person (contagious) through coughing, sneezing, or close contact. CAUSES Strep throat is caused by the bacteria called Streptococcus pyogenes. RISK FACTORS This condition is more likely to develop in:  People who spend time in crowded places where the infection can spread easily.  People who have close contact with someone who has strep throat. SYMPTOMS Symptoms of this condition include:  Fever or chills.   Redness, swelling, or pain in the tonsils or throat.  Pain or difficulty when swallowing.  White or yellow spots on the tonsils or throat.  Swollen, tender glands in the neck or under the jaw.  Red rash all over the body (rare). DIAGNOSIS This condition is diagnosed by performing a rapid strep test or by taking a swab of your throat (throat culture test). Results from a rapid strep test are usually ready in a few minutes, but throat culture test results are available after one or two days. TREATMENT This condition is treated with antibiotic medicine. HOME CARE INSTRUCTIONS Medicines  Take over-the-counter and prescription medicines only as told by your health care provider.  Take your antibiotic as told by your health care provider. Do not stop taking the antibiotic even if you start to feel  better.  Have family members who also have a sore throat or fever tested for strep throat. They may need antibiotics if they have the strep infection. Eating and Drinking  Do not share food, drinking cups, or personal items that could cause the infection to spread to other people.  If swallowing is difficult, try eating soft foods until your sore throat feels better.  Drink enough fluid to keep your urine clear or pale yellow. General Instructions  Gargle with a salt-water mixture 3-4 times per day or as needed. To make a salt-water mixture, completely dissolve -1 tsp of salt in 1 cup of warm water.  Make sure that all household members wash their hands well.  Get plenty of rest.  Stay home from school or work until you have been taking antibiotics for 24 hours.  Keep all follow-up visits as told by your health care provider. This is important. SEEK MEDICAL CARE IF:  The glands in your neck continue to get bigger.  You develop a rash, cough, or earache.  You cough up a thick liquid that is green, yellow-brown, or bloody.  You have pain or discomfort that does not get better with medicine.  Your problems seem to be getting worse rather than better.  You have a fever. SEEK IMMEDIATE MEDICAL CARE IF:  You have new symptoms, such as vomiting, severe headache, stiff or painful neck, chest pain, or shortness of breath.  You have severe throat pain, drooling, or changes in your voice.  You have swelling of the neck, or the  skin on the neck becomes red and tender.  You have signs of dehydration, such as fatigue, dry mouth, and decreased urination.  You become increasingly sleepy, or you cannot wake up completely.  Your joints become red or painful.   This information is not intended to replace advice given to you by your health care provider. Make sure you discuss any questions you have with your health care provider.   Document Released: 06/05/2000 Document Revised:  02/27/2015 Document Reviewed: 10/01/2014 Elsevier Interactive Patient Education [employer].

## 2015-04-23 NOTE — Progress Notes (Signed)
Pre visit review using our clinic review tool, if applicable. No additional management support is needed unless otherwise documented below in the visit note. 

## 2015-04-23 NOTE — Addendum Note (Signed)
Addended by: Terence Lux B on: 04/23/2015 01:17 PM   Modules accepted: Orders

## 2015-05-01 ENCOUNTER — Other Ambulatory Visit: Payer: Self-pay | Admitting: Internal Medicine

## 2015-05-01 NOTE — Telephone Encounter (Signed)
Rx faxed to pharmacy  

## 2015-05-01 NOTE — Telephone Encounter (Signed)
Done hardcopy to [name]  

## 2015-05-02 ENCOUNTER — Other Ambulatory Visit: Payer: Self-pay

## 2015-05-02 ENCOUNTER — Encounter: Payer: Self-pay | Admitting: Internal Medicine

## 2015-05-02 MED ORDER — ADALIMUMAB 40 MG/0.8ML ~~LOC~~ AJKT: 40.0000 mg | AUTO-INJECTOR | SUBCUTANEOUS | Status: DC

## 2015-05-08 ENCOUNTER — Encounter: Payer: Self-pay | Admitting: Internal Medicine

## 2015-05-08 ENCOUNTER — Ambulatory Visit: Payer: BLUE CROSS/BLUE SHIELD | Admitting: Internal Medicine

## 2015-05-10 ENCOUNTER — Encounter: Payer: Self-pay | Admitting: Internal Medicine

## 2015-05-11 ENCOUNTER — Encounter (HOSPITAL_COMMUNITY): Payer: Self-pay | Admitting: *Deleted

## 2015-05-11 ENCOUNTER — Emergency Department (HOSPITAL_COMMUNITY): Payer: BLUE CROSS/BLUE SHIELD

## 2015-05-11 ENCOUNTER — Emergency Department (HOSPITAL_COMMUNITY)
Admission: EM | Admit: 2015-05-11 | Discharge: 2015-05-11 | Disposition: A | Discharge status: Home / Self Care | Payer: BLUE CROSS/BLUE SHIELD | Attending: Emergency Medicine | Admitting: Emergency Medicine

## 2015-05-11 DIAGNOSIS — J029 Acute pharyngitis, unspecified: Secondary | ICD-10-CM | POA: Diagnosis present

## 2015-05-11 DIAGNOSIS — F329 Major depressive disorder, single episode, unspecified: Secondary | ICD-10-CM | POA: Insufficient documentation

## 2015-05-11 DIAGNOSIS — J020 Streptococcal pharyngitis: Secondary | ICD-10-CM

## 2015-05-11 DIAGNOSIS — Z8669 Personal history of other diseases of the nervous system and sense organs: Secondary | ICD-10-CM | POA: Diagnosis not present

## 2015-05-11 DIAGNOSIS — M545 Low back pain, unspecified: Secondary | ICD-10-CM

## 2015-05-11 DIAGNOSIS — Z87448 Personal history of other diseases of urinary system: Secondary | ICD-10-CM | POA: Diagnosis not present

## 2015-05-11 DIAGNOSIS — F419 Anxiety disorder, unspecified: Secondary | ICD-10-CM | POA: Diagnosis not present

## 2015-05-11 DIAGNOSIS — Z87442 Personal history of urinary calculi: Secondary | ICD-10-CM | POA: Insufficient documentation

## 2015-05-11 DIAGNOSIS — Z8719 Personal history of other diseases of the digestive system: Secondary | ICD-10-CM | POA: Insufficient documentation

## 2015-05-11 DIAGNOSIS — Z8639 Personal history of other endocrine, nutritional and metabolic disease: Secondary | ICD-10-CM | POA: Insufficient documentation

## 2015-05-11 DIAGNOSIS — Z8619 Personal history of other infectious and parasitic diseases: Secondary | ICD-10-CM | POA: Diagnosis not present

## 2015-05-11 DIAGNOSIS — Z79899 Other long term (current) drug therapy: Secondary | ICD-10-CM | POA: Insufficient documentation

## 2015-05-11 DIAGNOSIS — J40 Bronchitis, not specified as acute or chronic: Secondary | ICD-10-CM

## 2015-05-11 DIAGNOSIS — I10 Essential (primary) hypertension: Secondary | ICD-10-CM | POA: Diagnosis not present

## 2015-05-11 DIAGNOSIS — J209 Acute bronchitis, unspecified: Secondary | ICD-10-CM | POA: Diagnosis not present

## 2015-05-11 DIAGNOSIS — E669 Obesity, unspecified: Secondary | ICD-10-CM | POA: Insufficient documentation

## 2015-05-11 DIAGNOSIS — R05 Cough: Secondary | ICD-10-CM

## 2015-05-11 DIAGNOSIS — R059 Cough, unspecified: Secondary | ICD-10-CM

## 2015-05-11 LAB — URINALYSIS, ROUTINE W REFLEX MICROSCOPIC
Bilirubin Urine: NEGATIVE
Glucose, UA: NEGATIVE mg/dL
Hgb urine dipstick: NEGATIVE
KETONES UR: NEGATIVE mg/dL
LEUKOCYTES UA: NEGATIVE
Nitrite: NEGATIVE
PROTEIN: NEGATIVE mg/dL
Specific Gravity, Urine: 1.012 (ref 1.005–1.030)
pH: 6 (ref 5.0–8.0)

## 2015-05-11 LAB — RAPID STREP SCREEN (MED CTR MEBANE ONLY): STREPTOCOCCUS, GROUP A SCREEN (DIRECT): POSITIVE — AB

## 2015-05-11 MED ORDER — AEROCHAMBER Z-STAT PLUS/MEDIUM MISC: 1.0000 | Freq: Once | Status: DC

## 2015-05-11 MED ORDER — BENZONATATE 100 MG PO CAPS: 200.0000 mg | ORAL_CAPSULE | Freq: Two times a day (BID) | ORAL | Status: DC | PRN

## 2015-05-11 MED ORDER — CLINDAMYCIN HCL 300 MG PO CAPS
600.0000 mg | ORAL_CAPSULE | Freq: Once | ORAL | Status: AC
  Administered 2015-05-11: 600 mg via ORAL
  Filled 2015-05-11: qty 2

## 2015-05-11 MED ORDER — DIAZEPAM 5 MG PO TABS
10.0000 mg | ORAL_TABLET | Freq: Once | ORAL | Status: AC
  Administered 2015-05-11: 10 mg via ORAL
  Filled 2015-05-11: qty 2

## 2015-05-11 MED ORDER — ALBUTEROL SULFATE HFA 108 (90 BASE) MCG/ACT IN AERS
2.0000 | INHALATION_SPRAY | RESPIRATORY_TRACT | Status: DC | PRN
  Administered 2015-05-11: 2 via RESPIRATORY_TRACT
  Filled 2015-05-11: qty 6.7

## 2015-05-11 MED ORDER — CLINDAMYCIN HCL 150 MG PO CAPS: 450.0000 mg | ORAL_CAPSULE | Freq: Three times a day (TID) | ORAL | Status: DC

## 2015-05-11 MED ORDER — METHOCARBAMOL 500 MG PO TABS: 500.0000 mg | ORAL_TABLET | Freq: Two times a day (BID) | ORAL | Status: DC

## 2015-05-11 MED ORDER — PENICILLIN G BENZATHINE 1200000 UNIT/2ML IM SUSP
1.2000 10*6.[IU] | Freq: Once | INTRAMUSCULAR | Status: AC
  Administered 2015-05-11: 1.2 10*6.[IU] via INTRAMUSCULAR
  Filled 2015-05-11: qty 2

## 2015-05-11 NOTE — ED Provider Notes (Signed)
Patient signed out to me at shift change by Abigail Butts, [name].  39 year old male presents with with persistent strep pharyngitis s/p treatment with PCN. CXR remarkable for peribronchial thickening consistent with bronchitis. Also complains of muscular back strain d/t cough. Patient to be discharged with clindamycin, tessalon, and robaxin.  UA obtained given location of back pain. UA pending, patient has no UTI symptoms. UA negative for infection. Patient stable for discharge at this time.  BP 132/74 mmHg  Pulse 84  Temp(Src) 97.5 F (36.4 C) (Oral)  Resp 18  Ht 5' 9"  (1.753 m)  Wt 244 lb (110.678 kg)  BMI 36.02 kg/m2  SpO2 99%   Marella Chimes, [name] 05/11/15 [place], DO 05/12/15 [phone]

## 2015-05-11 NOTE — ED Notes (Signed)
Pt states that he was recently treated for sore throat 2 weeks ago and states that he took the antibiotics as prescribed; pt states that he began to have sore throat 2 days ago; pt also c/o cough and congestion; pt states that he was coughing and felt pain to rt lower back area; rt lower back area tender to palpation

## 2015-05-11 NOTE — Discharge Instructions (Signed)
1. Medications: robaxin, clindamycin, mucinex, tessalon, usual home medications 2. Treatment: rest, drink plenty of fluids, take tylenol or ibuprofen for fever control 3. Follow Up: Please followup with your primary doctor in 3 days for discussion of your diagnoses and further evaluation after today's visit; if you do not have a primary care doctor use the resource guide provided to find one; Return to the ER for high fevers, difficulty breathing or other concerning symptoms     Back Exercises The following exercises strengthen the muscles that help to support the back. They also help to keep the lower back flexible. Doing these exercises can help to prevent back pain or lessen existing pain. If you have back pain or discomfort, try doing these exercises 2-3 times each day or as told by your health care provider. When the pain goes away, do them once each day, but increase the number of times that you repeat the steps for each exercise (do more repetitions). If you do not have back pain or discomfort, do these exercises once each day or as told by your health care provider. EXERCISES Single Knee to Chest Repeat these steps 3-5 times for each leg: 1. Lie on your back on a firm bed or the floor with your legs extended. 2. Bring one knee to your chest. Your other leg should stay extended and in contact with the floor. 3. Hold your knee in place by grabbing your knee or thigh. 4. Pull on your knee until you feel a gentle stretch in your lower back. 5. Hold the stretch for 10-30 seconds. 6. Slowly release and straighten your leg. Pelvic Tilt Repeat these steps 5-10 times: 1. Lie on your back on a firm bed or the floor with your legs extended. 2. Bend your knees so they are pointing toward the ceiling and your feet are flat on the floor. 3. Tighten your lower abdominal muscles to press your lower back against the floor. This motion will tilt your pelvis so your tailbone points up toward the ceiling  instead of pointing to your feet or the floor. 4. With gentle tension and even breathing, hold this position for 5-10 seconds. Cat-Cow Repeat these steps until your lower back becomes more flexible: 1. Get into a hands-and-knees position on a firm surface. Keep your hands under your shoulders, and keep your knees under your hips. You may place padding under your knees for comfort. 2. Let your head hang down, and point your tailbone toward the floor so your lower back becomes rounded like the back of a cat. 3. Hold this position for 5 seconds. 4. Slowly lift your head and point your tailbone up toward the ceiling so your back forms a sagging arch like the back of a cow. 5. Hold this position for 5 seconds. Press-Ups Repeat these steps 5-10 times: 1. Lie on your abdomen (face-down) on the floor. 2. Place your palms near your head, about shoulder-width apart. 3. While you keep your back as relaxed as possible and keep your hips on the floor, slowly straighten your arms to raise the top half of your body and lift your shoulders. Do not use your back muscles to raise your upper torso. You may adjust the placement of your hands to make yourself more comfortable. 4. Hold this position for 5 seconds while you keep your back relaxed. 5. Slowly return to lying flat on the floor. Bridges Repeat these steps 10 times: 1. Lie on your back on a firm surface. 2. [place]  your knees so they are pointing toward the ceiling and your feet are flat on the floor. 3. Tighten your buttocks muscles and lift your buttocks off of the floor until your waist is at almost the same height as your knees. You should feel the muscles working in your buttocks and the back of your thighs. If you do not feel these muscles, slide your feet 1-2 inches farther away from your buttocks. 4. Hold this position for 3-5 seconds. 5. Slowly lower your hips to the starting position, and allow your buttocks muscles to relax completely. If this  exercise is too easy, try doing it with your arms crossed over your chest. Abdominal Crunches Repeat these steps 5-10 times: 1. Lie on your back on a firm bed or the floor with your legs extended. 2. Bend your knees so they are pointing toward the ceiling and your feet are flat on the floor. 3. Cross your arms over your chest. 4. Tip your chin slightly toward your chest without bending your neck. 5. Tighten your abdominal muscles and slowly raise your trunk (torso) high enough to lift your shoulder blades a tiny bit off of the floor. Avoid raising your torso higher than that, because it can put too much stress on your low back and it does not help to strengthen your abdominal muscles. 6. Slowly return to your starting position. Back Lifts Repeat these steps 5-10 times: 1. Lie on your abdomen (face-down) with your arms at your sides, and rest your forehead on the floor. 2. Tighten the muscles in your legs and your buttocks. 3. Slowly lift your chest off of the floor while you keep your hips pressed to the floor. Keep the back of your head in line with the curve in your back. Your eyes should be looking at the floor. 4. Hold this position for 3-5 seconds. 5. Slowly return to your starting position. SEEK MEDICAL CARE IF:  Your back pain or discomfort gets much worse when you do an exercise.  Your back pain or discomfort does not lessen within 2 hours after you exercise. If you have any of these problems, stop doing these exercises right away. Do not do them again unless your health care provider says that you can. SEEK IMMEDIATE MEDICAL CARE IF:  You develop sudden, severe back pain. If this happens, stop doing the exercises right away. Do not do them again unless your health care provider says that you can.   This information is not intended to replace advice given to you by your health care provider. Make sure you discuss any questions you have with your health care provider.   Document  Released: 07/16/2004 Document Revised: 02/27/2015 Document Reviewed: 08/02/2014 Elsevier Interactive Patient Education [employer].

## 2015-05-11 NOTE — ED Provider Notes (Signed)
CSN: [id number]     Arrival date & time 05/11/15  0451 History   First MD Initiated Contact with Patient 05/11/15 [id number]     Chief Complaint  Patient presents with  . Sore Throat  . Back Pain     (Consider location/radiation/quality/duration/timing/severity/associated sxs/prior Treatment) The history is provided by the patient and medical records. No language interpreter was used.     [name] [name] is a 39 y.o. male  with a hx of OSA, anxiety, depression, hypertension, Crohn's disease, CMV, depression, syncope presents to the Emergency Department complaining of gradual, persistent, progressively worsening sore throat onset 2 days ago. Patient reports that he was treated for strep pharyngitis approximately 2 weeks ago and improved. He reports that 2 days ago his symptoms of sore throat and posterior oropharyngeal edema returned along with rhinorrhea, nasal congestion, postnasal drip, et al. nausea, cough and right-sided low back pain. Patient reports sharp stabbing pain in the right lower back every time he coughs. He reports that movement and coughing aggravate the pain and nothing makes it worse. He reports taking Tylenol with only minimal relief. Patient reports he cannot take ibuprofen due to his Crohn's disease. He denies all urinary symptoms. He denies fever however he has a low-grade fever of 100.35F on arrival.  He reports decreased by mouth intake due to his sore throat.   no aggravating or alleviating factors.Pt denies chills, headache, neck pain, chest pain, shortness of breath, abdominal pain, vomiting, diarrhea, weakness, dizziness, syncope.     Past Medical History  Diagnosis Date  . Obstructive sleep apnea (adult) (pediatric)   . Anxiety and depression   . Essential hypertension, benign   . Obesity, unspecified   . Herpes zoster infection 07/2010, 12/2010    with post-herpetic neuralgia  . Vitamin D deficiency   . Crohn's disease ([place])   . Hyperlipidemia   . CMV  (cytomegalovirus infection) ([place]) 10/28/2010  . Nephrolithiasis   . Fistula, perirectal   . Sinus infection 08/05/11  . Depression   . Syncope   . Pyelonephritis 2015  . Plantar fasciitis of right foot 08/22/2013  . Tinea pedis 06/27/2013  . Anal fissure - posterior 02/15/2014   Past Surgical History  Procedure Laterality Date  . Small intestine surgery  08/05/07    1 ft removed, ileo-cecectomy  . Hypospadias correction      Childhood   . Upper gastrointestinal endoscopy  10/15/2010    w/biopsy, mild gastritis and duodenitis  . Colonoscopy  10/14/2007    crohn's colitis, aphthous ulcers, mild anal stenosis  . Colon resection    . Tooth extraction     Family History  Problem Relation Age of Onset  . Heart disease Father     CAD/MI  . Hyperlipidemia Father   . Hypertension Father   . Gout Father   . Sleep apnea Father   . Colon cancer Neg Hx   . Diabetes Neg Hx   . COPD Neg Hx   . Obesity Mother   . Allergies Mother   . Cancer Mother   . Stroke Maternal Grandfather   . Allergies Sister   . Asthma Sister    Social History  Substance Use Topics  . Smoking status: Never Smoker   . Smokeless tobacco: Never Used  . Alcohol Use: 1.8 oz/week    3 Shots of liquor per week     Comment: 3/week    Review of Systems  Constitutional: Positive for fatigue. Negative for fever, chills and appetite  change.  HENT: Positive for congestion, postnasal drip, rhinorrhea, sinus pressure and sore throat. Negative for ear discharge, ear pain and mouth sores.   Eyes: Negative for visual disturbance.  Respiratory: Positive for cough, chest tightness, shortness of breath and wheezing. Negative for stridor.   Cardiovascular: Negative for chest pain, palpitations and leg swelling.  Gastrointestinal: Negative for nausea, vomiting, abdominal pain and diarrhea.  Genitourinary: Negative for dysuria, urgency, frequency and hematuria.  Musculoskeletal: Negative for myalgias, back pain, arthralgias and neck  stiffness.  Skin: Negative for rash.  Neurological: Positive for headaches. Negative for syncope, light-headedness and numbness.  Hematological: Negative for adenopathy.  Psychiatric/Behavioral: The patient is not nervous/anxious.   All other systems reviewed and are negative.     Allergies  Review of patient's allergies indicates no known allergies.  Home Medications   Prior to Admission medications   Medication Sig Start Date End Date Taking? Authorizing Provider  acetaminophen (TYLENOL) 325 MG tablet Take 650 mg by mouth every 6 (six) hours as needed for moderate pain.   Yes Historical Provider, MD  acidophilus (RISAQUAD) CAPS capsule Take 1 capsule by mouth daily.   Yes Historical Provider, MD  Adalimumab 40 MG/0.8ML PNKT Inject 40 mg into the skin every 14 (fourteen) days. 05/02/15  Yes Gatha Mayer, MD  alprazolam Duanne Moron) 2 MG tablet Take half tab by mouth twice per day as needed Patient taking differently: Take 1 mg by mouth 2 (two) times daily as needed for sleep or anxiety. Anxiety 04/04/15  Yes Biagio Borg, MD  diphenoxylate-atropine (LOMOTIL) 2.5-0.025 MG per tablet Take 1 tablet by mouth 4 (four) times daily as needed for diarrhea or loose stools. Patient taking differently: Take 1 tablet by mouth daily.  03/16/14  Yes Gatha Mayer, MD  emtricitabine-tenofovir (TRUVADA) 200-300 MG per tablet Take 1 tablet by mouth daily. 10/02/14  Yes Gatha Mayer, MD  guaiFENesin (MUCINEX) 600 MG 12 hr tablet Take 1,200 mg by mouth 2 (two) times daily as needed for cough or to loosen phlegm.   Yes Historical Provider, MD  lisinopril-hydrochlorothiazide (PRINZIDE,ZESTORETIC) 20-25 MG per tablet TAKE 1 TABLET BY MOUTH DAILY 02/22/15  Yes Gatha Mayer, MD  mirtazapine (REMERON) 15 MG tablet Take 1 tablet (15 mg total) by mouth at bedtime. 02/22/15  Yes Biagio Borg, MD  PARoxetine (PAXIL) 20 MG tablet TAKE 2 TABLETS BY MOUTH DAILY 02/22/15  Yes Gatha Mayer, MD  benzonatate (TESSALON) 100 MG  capsule Take 2 capsules (200 mg total) by mouth 2 (two) times daily as needed for cough. 05/11/15   Ahkeem Goede, [name]  clindamycin (CLEOCIN) 150 MG capsule Take 3 capsules (450 mg total) by mouth 3 (three) times daily. 05/11/15   Richardson Dubree, [name]  methocarbamol (ROBAXIN) 500 MG tablet Take 1 tablet (500 mg total) by mouth 2 (two) times daily. 05/11/15   Yoshiaki Kreuser, [name]   BP 132/74 mmHg  Pulse 84  Temp(Src) 97.5 F (36.4 C) (Oral)  Resp 18  Ht 5' 9"  (1.753 m)  Wt 244 lb (110.678 kg)  BMI 36.02 kg/m2  SpO2 99% Physical Exam  Constitutional: He is oriented to person, place, and time. He appears well-developed and well-nourished. No distress.  HENT:  Head: Normocephalic and atraumatic.  Right Ear: Tympanic membrane, external ear and ear canal normal.  Left Ear: Tympanic membrane, external ear and ear canal normal.  Nose: Mucosal edema and rhinorrhea present. No epistaxis. Right sinus exhibits no maxillary sinus tenderness and no frontal sinus  tenderness. Left sinus exhibits no maxillary sinus tenderness and no frontal sinus tenderness.  Mouth/Throat: Uvula is midline and mucous membranes are normal. Mucous membranes are not pale, not dry and not cyanotic. No trismus in the jaw. No uvula swelling. Oropharyngeal exudate, posterior oropharyngeal edema and posterior oropharyngeal erythema present. No tonsillar abscesses.  Posterior oropharynx with erythema, edema and exudate on the tonsils  Eyes: Conjunctivae are normal. Pupils are equal, round, and reactive to light.  Neck: Normal range of motion, full passive range of motion without pain and phonation normal. Neck supple. No tracheal tenderness, no spinous process tenderness and no muscular tenderness present. No rigidity. No erythema and normal range of motion present. No Brudzinski's sign and no Kernig's sign noted.  Range of motion without pain  No midline or paraspinal tenderness Normal phonation No  stridor Handling secretions without difficulty No nuchal rigidity or meningeal signs  Cardiovascular: Normal rate, regular rhythm, normal heart sounds and intact distal pulses.   Pulses:      Radial pulses are 2+ on the right side, and 2+ on the left side.  Pulmonary/Chest: Effort normal and breath sounds normal. No stridor. No respiratory distress. He has no decreased breath sounds. He has no wheezes.  Clear and equal breath sounds without focal wheezes, rhonchi, rales  Abdominal: Soft. Bowel sounds are normal. He exhibits no distension. There is no tenderness.  Musculoskeletal: Normal range of motion.  Full range of motion of the T-spine and L-spine No tenderness to palpation of the spinous processes of the T-spine or L-spine Mild tenderness to palpation of the right paraspinous muscles of the L-spine  Lymphadenopathy:       Head (right side): Submandibular and tonsillar adenopathy present. No submental, no preauricular, no posterior auricular and no occipital adenopathy present.       Head (left side): Submandibular and tonsillar adenopathy present. No submental, no preauricular, no posterior auricular and no occipital adenopathy present.    He has no cervical adenopathy.       Right cervical: No superficial cervical, no deep cervical and no posterior cervical adenopathy present.      Left cervical: No superficial cervical, no deep cervical and no posterior cervical adenopathy present.  Neurological: He is alert and oriented to person, place, and time. He has normal reflexes.  Reflex Scores:      Bicep reflexes are 2+ on the right side and 2+ on the left side.      Brachioradialis reflexes are 2+ on the right side and 2+ on the left side.      Patellar reflexes are 2+ on the right side and 2+ on the left side.      Achilles reflexes are 2+ on the right side and 2+ on the left side. Speech is clear and goal oriented, follows commands Normal 5/5 strength in upper and lower extremities  bilaterally including dorsiflexion and plantar flexion, strong and equal grip strength Sensation normal to light and sharp touch Moves extremities without ataxia, coordination intact Normal gait Normal balance No Clonus   Skin: Skin is warm and dry. No rash noted. He is not diaphoretic. No erythema.  Psychiatric: He has a normal mood and affect. His behavior is normal.  Nursing note and vitals reviewed.   ED Course  Procedures (including critical care time) Labs Review Labs Reviewed  RAPID STREP SCREEN (NOT AT [facility]) - Abnormal; Notable for the following:    Streptococcus, Group A Screen (Direct) POSITIVE (*)    All other components  within normal limits  URINALYSIS, ROUTINE W REFLEX MICROSCOPIC (NOT AT [facility])    Imaging Review Dg Chest 2 View  05/11/2015  CLINICAL DATA:  Right-sided flank pain and cough. EXAM: CHEST  2 VIEW COMPARISON:  09/12/2014 FINDINGS: The cardiomediastinal contours are normal. There is bronchial thickening. Pulmonary vasculature is normal. No consolidation, pleural effusion, or pneumothorax. No acute osseous abnormalities are seen. IMPRESSION: Bronchial thickening, increased from prior exam, consistent with bronchitis or asthma. Electronically Signed   By: Jeb Levering M.D.   On: 05/11/2015 05:51   I have personally reviewed and evaluated these images and lab results as part of my medical decision-making.   EKG Interpretation None      MDM   Final diagnoses:  Strep pharyngitis  Bronchitis  Right-sided low back pain without sciatica  Cough   [name] [name] presents with multiple complaints. Patient's symptoms are likely secondary to viral URI however his throat has significant exudate and edema. Concern for persistent strep pharyngitis. X-ray with peribronchial thickening consistent with bronchitis or asthma. No wheezing on exam.  Right lower back pain.  Rapid strep is positive.  December believe the patient's course of Vantin was not long enough to  completely eradicate his strep pharyngitis. Patient given penicillin here in the emergency department and will be discharged home with clindamycin. Patient given Robaxin for his low back pain/strain and Tessalon for his coughing.  Urinalysis pending. Care transferred to [facility], [name] who will follow-up on UA.  No further treatment if no signs of UTI.  Plan for discharge home at that time.  PCP follow-up recommended.  BP 132/74 mmHg  Pulse 84  Temp(Src) 97.5 F (36.4 C) (Oral)  Resp 18  Ht 5' 9"  (1.753 m)  Wt 244 lb (110.678 kg)  BMI 36.02 kg/m2  SpO2 99%      Abigail Butts, [name] 05/11/15 [place], DO 05/11/15 2505

## 2015-05-13 ENCOUNTER — Ambulatory Visit (INDEPENDENT_AMBULATORY_CARE_PROVIDER_SITE_OTHER): Payer: BLUE CROSS/BLUE SHIELD | Admitting: Family Medicine

## 2015-05-13 VITALS — BP 112/68 | HR 101 | Temp 98.6°F | Resp 20 | Ht 68.0 in | Wt 249.4 lb

## 2015-05-13 DIAGNOSIS — S20211A Contusion of right front wall of thorax, initial encounter: Secondary | ICD-10-CM

## 2015-05-13 DIAGNOSIS — J209 Acute bronchitis, unspecified: Secondary | ICD-10-CM | POA: Diagnosis not present

## 2015-05-13 DIAGNOSIS — J020 Streptococcal pharyngitis: Secondary | ICD-10-CM | POA: Diagnosis not present

## 2015-05-13 MED ORDER — HYDROCODONE-ACETAMINOPHEN 7.5-325 MG/15ML PO SOLN: 10.0000 mL | ORAL | Status: DC | PRN

## 2015-05-13 MED ORDER — BENZONATATE 200 MG PO CAPS: 200.0000 mg | ORAL_CAPSULE | Freq: Three times a day (TID) | ORAL | Status: DC

## 2015-05-13 NOTE — Patient Instructions (Signed)
Rib Contusion A rib contusion is a deep bruise on your rib area. Contusions are the result of a blunt trauma that causes bleeding and injury to the tissues under the skin. A rib contusion may involve bruising of the ribs and of the skin and muscles in the area. The skin overlying the contusion may turn blue, purple, or yellow. Minor injuries will give you a painless contusion, but more severe contusions may stay painful and swollen for a few weeks. CAUSES  A contusion is usually caused by a blow, trauma, or direct force to an area of the body. This often occurs while playing contact sports. SYMPTOMS  Swelling and redness of the injured area.  Discoloration of the injured area.  Tenderness and soreness of the injured area.  Pain with or without movement. DIAGNOSIS  The diagnosis can be made by taking a medical history and performing a physical exam. An X-ray, CT scan, or MRI may be needed to determine if there were any associated injuries, such as broken bones (fractures) or internal injuries. TREATMENT  Often, the best treatment for a rib contusion is rest. Icing or applying cold compresses to the injured area may help reduce swelling and inflammation. Deep breathing exercises may be recommended to reduce the risk of partial lung collapse and pneumonia. Over-the-counter or prescription medicines may also be recommended for pain control. HOME CARE INSTRUCTIONS   Apply ice to the injured area:  Put ice in a plastic bag.  Place a towel between your skin and the bag.  Leave the ice on for 20 minutes, 2-3 times per day.  Take medicines only as directed by your health care provider.  Rest the injured area. Avoid strenuous activity and any activities or movements that cause pain. Be careful during activities and avoid bumping the injured area.  Perform deep-breathing exercises as directed by your health care provider.  Do not lift anything that is heavier than 5 lb (2.3 kg) until your  health care provider approves.  Do not use any tobacco products, including cigarettes, chewing tobacco, or electronic cigarettes. If you need help quitting, ask your health care provider. SEEK MEDICAL CARE IF:   You have increased bruising or swelling.  You have pain that is not controlled with treatment.  You have a fever. SEEK IMMEDIATE MEDICAL CARE IF:   You have difficulty breathing or shortness of breath.  You develop a continual cough, or you cough up thick or bloody sputum.  You feel sick to your stomach (nauseous), you throw up (vomit), or you have abdominal pain.   This information is not intended to replace advice given to you by your health care provider. Make sure you discuss any questions you have with your health care provider.   Document Released: 03/03/2001 Document Revised: 06/29/2014 Document Reviewed: 03/20/2014 Elsevier Interactive Patient Education [employer].

## 2015-05-13 NOTE — Progress Notes (Signed)
Subjective:    Patient ID: [name] [name], male    DOB: 1976/05/31, 39 y.o.   MRN: [id number] This chart was scribed for Delman Cheadle, MD by Marti Sleigh, Medical Scribe. This patient was seen in Room 14 and the patient's care was started a 8:58 PM.  Chief Complaint  Patient presents with  . Cough  . Sore Throat  . Back Pain    lower right side   . Sinusitis    HPI HPI Comments: [name] [name] is a 39 y.o. male who presents to [facility] complaining of continued cough, sore throat, sinusitis and severe lower right-sided back pain deep to his ribs. He is also complaining of severe low back pain deep to his ribs. Two weeks ago he had strep throat and was treated with one gram of rosephin, and cephalotaxine for five days. His sx partially improved, and then worsened one week ago and he reported to the ED with continued cough, back pain, sore throat, and sinus pressure with drainage. He was diagnosed with a continued strep throat with a positive strep test, pharyngitis, bronchitis as well as a muscular back strain. He denies hearing a pop or feeling his rib crack.   He has a past hx of clostridia difficile following a prolonged use of prolonged abx.   Past Medical History  Diagnosis Date  . Obstructive sleep apnea (adult) (pediatric)   . Anxiety and depression   . Essential hypertension, benign   . Obesity, unspecified   . Herpes zoster infection 07/2010, 12/2010    with post-herpetic neuralgia  . Vitamin D deficiency   . Crohn's disease ([place])   . Hyperlipidemia   . CMV (cytomegalovirus infection) ([place]) 10/28/2010  . Nephrolithiasis   . Fistula, perirectal   . Sinus infection 08/05/11  . Depression   . Syncope   . Pyelonephritis 2015  . Plantar fasciitis of right foot 08/22/2013  . Tinea pedis 06/27/2013  . Anal fissure - posterior 02/15/2014   No Known Allergies   Current Outpatient Prescriptions on File Prior to Visit  Medication Sig Dispense Refill  . acetaminophen (TYLENOL) 325 MG  tablet Take 650 mg by mouth every 6 (six) hours as needed for moderate pain.    Marland Kitchen acidophilus (RISAQUAD) CAPS capsule Take 1 capsule by mouth daily.    . Adalimumab 40 MG/0.8ML PNKT Inject 40 mg into the skin every 14 (fourteen) days. 2 each 6  . alprazolam (XANAX) 2 MG tablet Take half tab by mouth twice per day as needed (Patient taking differently: Take 1 mg by mouth 2 (two) times daily as needed for sleep or anxiety. Anxiety) 30 tablet 5  . benzonatate (TESSALON) 100 MG capsule Take 2 capsules (200 mg total) by mouth 2 (two) times daily as needed for cough. 20 capsule 0  . clindamycin (CLEOCIN) 150 MG capsule Take 3 capsules (450 mg total) by mouth 3 (three) times daily. 90 capsule 0  . emtricitabine-tenofovir (TRUVADA) 200-300 MG per tablet Take 1 tablet by mouth daily. 90 tablet 3  . guaiFENesin (MUCINEX) 600 MG 12 hr tablet Take 1,200 mg by mouth 2 (two) times daily as needed for cough or to loosen phlegm.    Marland Kitchen lisinopril-hydrochlorothiazide (PRINZIDE,ZESTORETIC) 20-25 MG per tablet TAKE 1 TABLET BY MOUTH DAILY 30 tablet 5  . methocarbamol (ROBAXIN) 500 MG tablet Take 1 tablet (500 mg total) by mouth 2 (two) times daily. 20 tablet 0  . PARoxetine (PAXIL) 20 MG tablet TAKE 2 TABLETS BY MOUTH DAILY  60 tablet 5  . diphenoxylate-atropine (LOMOTIL) 2.5-0.025 MG per tablet Take 1 tablet by mouth 4 (four) times daily as needed for diarrhea or loose stools. (Patient not taking: Reported on 05/13/2015) 90 tablet 0  . mirtazapine (REMERON) 15 MG tablet Take 1 tablet (15 mg total) by mouth at bedtime. 90 tablet 3  . [DISCONTINUED] colestipol (COLESTID) 1 G tablet Take 4 tablets (4 g total) by mouth daily with supper. (Patient not taking: Reported on 05/11/2015) 120 tablet 3   No current facility-administered medications on file prior to visit.    Review of Systems  Constitutional: Positive for activity change, appetite change and fatigue. Negative for fever, chills and unexpected weight change.    HENT: Positive for congestion, postnasal drip, rhinorrhea, sinus pressure, sore throat and trouble swallowing. Negative for facial swelling.   Respiratory: Positive for cough and chest tightness. Negative for wheezing.   Cardiovascular: Positive for chest pain. Negative for palpitations and leg swelling.  Gastrointestinal: Negative for constipation.  Genitourinary: Positive for flank pain.  Musculoskeletal: Positive for back pain.  Skin: Negative for color change and wound.  Hematological: Positive for adenopathy.      Objective:  BP 112/68 mmHg  Pulse 101  Temp(Src) 98.6 F (37 C) (Oral)  Resp 20  Ht 5' 8"  (1.727 m)  Wt 249 lb 6.4 oz (113.127 kg)  BMI 37.93 kg/m2  SpO2 97%  Physical Exam  Constitutional: He is oriented to person, place, and time. He appears well-developed and well-nourished. No distress.  HENT:  Head: Normocephalic and atraumatic.  Right TM normal, left TM retracted. Left nare with edema, right nare normal. Throat with severe erythema, deep beat red, 3+ tonsils touching uvula, no exudates. Severe submandibular and tonsillar adenopathy.   Eyes: Pupils are equal, round, and reactive to light.  Neck: Neck supple.  Cardiovascular: Normal rate.   Pulmonary/Chest: Effort normal. No respiratory distress.  Lungs clear.  Musculoskeletal: Normal range of motion.  No bruise no mass, pin point severe tenderness over the lowest rib right at the right flank.   Neurological: He is alert and oriented to person, place, and time. Coordination normal.  Skin: Skin is warm and dry. He is not diaphoretic.  Psychiatric: He has a normal mood and affect. His behavior is normal.  Nursing note and vitals reviewed.     Assessment & Plan:   1. Rib contusion, right, initial encounter   2. Acute bronchitis, unspecified organism   3. Streptococcal sore throat     Meds ordered this encounter  Medications  . HYDROcodone-acetaminophen (HYCET) 7.5-325 mg/15 ml solution    Sig: Take  10-20 mLs by mouth every 4 (four) hours as needed for moderate pain.    Dispense:  240 mL    Refill:  0  . benzonatate (TESSALON) 200 MG capsule    Sig: Take 1 capsule (200 mg total) by mouth 3 (three) times daily.    Dispense:  60 capsule    Refill:  0    I personally performed the services described in this documentation, which was scribed in my presence. The recorded information has been reviewed and considered, and addended by me as needed.  Delman Cheadle, MD MPH  By signing my name below, I, Judithe Modest, attest that this documentation has been prepared under the direction and in the presence of Delman Cheadle, MD. Electronically Signed: Judithe Modest, ER Scribe. 05/13/2015. 8:50 PM.

## 2015-05-15 ENCOUNTER — Encounter: Payer: Self-pay | Admitting: Internal Medicine

## 2015-05-15 DIAGNOSIS — J3501 Chronic tonsillitis: Secondary | ICD-10-CM

## 2015-05-15 NOTE — Addendum Note (Signed)
Addended by: Biagio Borg on: 05/15/2015 04:59 PM   Modules accepted: Orders

## 2015-05-16 ENCOUNTER — Other Ambulatory Visit: Payer: Self-pay | Admitting: Family Medicine

## 2015-05-17 ENCOUNTER — Telehealth: Payer: Self-pay

## 2015-05-17 ENCOUNTER — Other Ambulatory Visit: Payer: Self-pay | Admitting: Family Medicine

## 2015-05-17 MED ORDER — HYDROCODONE-ACETAMINOPHEN 5-325 MG PO TABS
1.0000 | ORAL_TABLET | Freq: Four times a day (QID) | ORAL | Status: DC | PRN
Start: 2015-05-17 — End: 2015-06-11

## 2015-05-17 NOTE — Addendum Note (Signed)
Addended by: Delman Cheadle on: 05/17/2015 05:42 PM   Modules accepted: Orders, Medications

## 2015-05-17 NOTE — Telephone Encounter (Signed)
Pt will be by to pickup rx at 102

## 2015-05-17 NOTE — Telephone Encounter (Signed)
Dr. Brigitte Pulse    Patient is requesting refill on cough syrup.  HYDROcodone-acetaminophen (HYCET) 7.5-325 mg/15 ml solution  [facility] on [address]  [phone] (H)

## 2015-05-17 NOTE — Telephone Encounter (Signed)
ALready a mychart message sent to Dr. Brigitte Pulse. Awaiting response.

## 2015-05-17 NOTE — Telephone Encounter (Signed)
I do not have any Mychart message in his chart or my inbox. Will go ahead and change patient over to pills for his rib injury - will not appear to be as ridiculous in amount and should work just as well. Hopefully his tonsils have improved enough to allow him to swallow them and if not then should be seen.  Will have to pick up rx.

## 2015-06-03 ENCOUNTER — Emergency Department (HOSPITAL_COMMUNITY)
Admission: EM | Admit: 2015-06-03 | Discharge: 2015-06-03 | Disposition: A | Discharge status: Home / Self Care | Payer: BLUE CROSS/BLUE SHIELD | Attending: Emergency Medicine | Admitting: Emergency Medicine

## 2015-06-03 ENCOUNTER — Encounter (HOSPITAL_COMMUNITY): Payer: Self-pay

## 2015-06-03 DIAGNOSIS — Z8669 Personal history of other diseases of the nervous system and sense organs: Secondary | ICD-10-CM | POA: Insufficient documentation

## 2015-06-03 DIAGNOSIS — R197 Diarrhea, unspecified: Secondary | ICD-10-CM | POA: Insufficient documentation

## 2015-06-03 DIAGNOSIS — Z8739 Personal history of other diseases of the musculoskeletal system and connective tissue: Secondary | ICD-10-CM | POA: Diagnosis not present

## 2015-06-03 DIAGNOSIS — K5090 Crohn's disease, unspecified, without complications: Secondary | ICD-10-CM | POA: Insufficient documentation

## 2015-06-03 DIAGNOSIS — F419 Anxiety disorder, unspecified: Secondary | ICD-10-CM | POA: Insufficient documentation

## 2015-06-03 DIAGNOSIS — Z79899 Other long term (current) drug therapy: Secondary | ICD-10-CM | POA: Insufficient documentation

## 2015-06-03 DIAGNOSIS — Z8709 Personal history of other diseases of the respiratory system: Secondary | ICD-10-CM | POA: Diagnosis not present

## 2015-06-03 DIAGNOSIS — R1010 Upper abdominal pain, unspecified: Secondary | ICD-10-CM | POA: Diagnosis present

## 2015-06-03 DIAGNOSIS — Z8619 Personal history of other infectious and parasitic diseases: Secondary | ICD-10-CM | POA: Insufficient documentation

## 2015-06-03 DIAGNOSIS — K921 Melena: Secondary | ICD-10-CM | POA: Diagnosis not present

## 2015-06-03 DIAGNOSIS — Z87442 Personal history of urinary calculi: Secondary | ICD-10-CM | POA: Insufficient documentation

## 2015-06-03 DIAGNOSIS — Z87448 Personal history of other diseases of urinary system: Secondary | ICD-10-CM | POA: Insufficient documentation

## 2015-06-03 DIAGNOSIS — R1110 Vomiting, unspecified: Secondary | ICD-10-CM

## 2015-06-03 DIAGNOSIS — F329 Major depressive disorder, single episode, unspecified: Secondary | ICD-10-CM | POA: Diagnosis not present

## 2015-06-03 DIAGNOSIS — I10 Essential (primary) hypertension: Secondary | ICD-10-CM | POA: Insufficient documentation

## 2015-06-03 DIAGNOSIS — R1013 Epigastric pain: Secondary | ICD-10-CM | POA: Insufficient documentation

## 2015-06-03 DIAGNOSIS — E669 Obesity, unspecified: Secondary | ICD-10-CM | POA: Diagnosis not present

## 2015-06-03 LAB — CBC
HEMATOCRIT: 43 % (ref 39.0–52.0)
Hemoglobin: 14.2 g/dL (ref 13.0–17.0)
MCH: 31.8 pg (ref 26.0–34.0)
MCHC: 33 g/dL (ref 30.0–36.0)
MCV: 96.2 fL (ref 78.0–100.0)
Platelets: 298 10*3/uL (ref 150–400)
RBC: 4.47 MIL/uL (ref 4.22–5.81)
RDW: 14.8 % (ref 11.5–15.5)
WBC: 8.7 10*3/uL (ref 4.0–10.5)

## 2015-06-03 LAB — COMPREHENSIVE METABOLIC PANEL
ALBUMIN: 4.2 g/dL (ref 3.5–5.0)
ALT: 45 U/L (ref 17–63)
ANION GAP: 7 (ref 5–15)
AST: 37 U/L (ref 15–41)
Alkaline Phosphatase: 70 U/L (ref 38–126)
BILIRUBIN TOTAL: 0.7 mg/dL (ref 0.3–1.2)
BUN: 12 mg/dL (ref 6–20)
CO2: 27 mmol/L (ref 22–32)
Calcium: 9.2 mg/dL (ref 8.9–10.3)
Chloride: 102 mmol/L (ref 101–111)
Creatinine, Ser: 0.89 mg/dL (ref 0.61–1.24)
GFR calc Af Amer: >60 mL/min (ref >60)
GLUCOSE: 93 mg/dL (ref 65–99)
POTASSIUM: 3.9 mmol/L (ref 3.5–5.1)
Sodium: 136 mmol/L (ref 135–145)
TOTAL PROTEIN: 8.2 g/dL — AB (ref 6.5–8.1)

## 2015-06-03 LAB — LIPASE, BLOOD: Lipase: 65 U/L — ABNORMAL HIGH (ref 11–51)

## 2015-06-03 MED ORDER — ONDANSETRON HCL 4 MG PO TABS: 4.0000 mg | ORAL_TABLET | Freq: Four times a day (QID) | ORAL | Status: DC

## 2015-06-03 MED ORDER — ONDANSETRON HCL 4 MG/2ML IJ SOLN
4.0000 mg | Freq: Once | INTRAMUSCULAR | Status: AC
  Administered 2015-06-03: 4 mg via INTRAVENOUS
  Filled 2015-06-03: qty 2

## 2015-06-03 MED ORDER — HYOSCYAMINE SULFATE 0.125 MG PO TABS
0.1250 mg | ORAL_TABLET | Freq: Once | ORAL | Status: DC
  Filled 2015-06-03: qty 1

## 2015-06-03 MED ORDER — SODIUM CHLORIDE 0.9 % IV BOLUS (SEPSIS)
1000.0000 mL | Freq: Once | INTRAVENOUS | Status: AC
Start: 2015-06-03 — End: 2015-06-03
  Administered 2015-06-03: 1000 mL via INTRAVENOUS

## 2015-06-03 MED ORDER — HYOSCYAMINE SULFATE 0.125 MG SL SUBL
0.1250 mg | SUBLINGUAL_TABLET | Freq: Once | SUBLINGUAL | Status: AC
  Administered 2015-06-03: 0.125 mg via SUBLINGUAL
  Filled 2015-06-03: qty 1

## 2015-06-03 NOTE — ED Notes (Signed)
Pt here with abdominal pain and rectal bleeding. Pt hx of chron's. Decreased appetite.  Symptoms since Saturday.  Blood started this morning. No fever.

## 2015-06-03 NOTE — ED Notes (Signed)
Pt cannot use restroom at this time, aware urine specimen is needed.  

## 2015-06-03 NOTE — ED Notes (Signed)
Patient was alert, oriented and stable upon discharge. RN went over AVS and patient had no further questions.  

## 2015-06-03 NOTE — Discharge Instructions (Signed)

## 2015-06-03 NOTE — ED Provider Notes (Signed)
CSN: [id number]   Arrival date & time 06/03/15  1019 History   First MD Initiated Contact with Patient 06/03/15 1337     Chief Complaint  Patient presents with  . Abdominal Pain  . Rectal Bleeding     Patient is a 39 y.o. male presenting with abdominal pain and hematochezia. The history is provided by the patient. No language interpreter was used.  Abdominal Pain Associated symptoms: hematochezia   Rectal Bleeding Associated symptoms: abdominal pain    BRAYDON KULLMAN is a 39 y.o. male who presents to the Emergency Department complaining of abdominal pain.He has a history of Crohn's disease who presents for evaluation of upper abdominal pain, diarrhea, vomiting for the last 3 days. Symptoms are moderate in nature. He has associated hematochezia starting today. He denies any fevers. He was on antibiotics for strep throat and completed treatment about one week ago.   Symptoms are moderate, constant, worsening.  Past Medical History  Diagnosis Date  . Obstructive sleep apnea (adult) (pediatric)   . Anxiety and depression   . Essential hypertension, benign   . Obesity, unspecified   . Herpes zoster infection 07/2010, 12/2010    with post-herpetic neuralgia  . Vitamin D deficiency   . Crohn's disease ([place])   . Hyperlipidemia   . CMV (cytomegalovirus infection) ([place]) 10/28/2010  . Nephrolithiasis   . Fistula, perirectal   . Sinus infection 08/05/11  . Depression   . Syncope   . Pyelonephritis 2015  . Plantar fasciitis of right foot 08/22/2013  . Tinea pedis 06/27/2013  . Anal fissure - posterior 02/15/2014   Past Surgical History  Procedure Laterality Date  . Small intestine surgery  08/05/07    1 ft removed, ileo-cecectomy  . Hypospadias correction      Childhood   . Upper gastrointestinal endoscopy  10/15/2010    w/biopsy, mild gastritis and duodenitis  . Colonoscopy  10/14/2007    crohn's colitis, aphthous ulcers, mild anal stenosis  . Colon resection    . Tooth extraction      Family History  Problem Relation Age of Onset  . Heart disease Father     CAD/MI  . Hyperlipidemia Father   . Hypertension Father   . Gout Father   . Sleep apnea Father   . Colon cancer Neg Hx   . Diabetes Neg Hx   . COPD Neg Hx   . Obesity Mother   . Allergies Mother   . Cancer Mother   . Stroke Maternal Grandfather   . Allergies Sister   . Asthma Sister    Social History  Substance Use Topics  . Smoking status: Never Smoker   . Smokeless tobacco: Never Used  . Alcohol Use: 1.8 oz/week    3 Shots of liquor per week     Comment: 3/week    Review of Systems  Gastrointestinal: Positive for abdominal pain and hematochezia.  All other systems reviewed and are negative.     Allergies  Review of patient's allergies indicates no known allergies.  Home Medications   Prior to Admission medications   Medication Sig Start Date End Date Taking? Authorizing Provider  acetaminophen (TYLENOL) 325 MG tablet Take 650 mg by mouth every 6 (six) hours as needed for moderate pain.    Historical Provider, MD  acidophilus (RISAQUAD) CAPS capsule Take 1 capsule by mouth daily.    Historical Provider, MD  Adalimumab 40 MG/0.8ML PNKT Inject 40 mg into the skin every 14 (fourteen) days. 05/02/15  Gatha Mayer, MD  alprazolam Duanne Moron) 2 MG tablet Take half tab by mouth twice per day as needed Patient taking differently: Take 1 mg by mouth 2 (two) times daily as needed for sleep or anxiety. Anxiety 04/04/15   Biagio Borg, MD  benzonatate (TESSALON) 200 MG capsule Take 1 capsule (200 mg total) by mouth 3 (three) times daily. 05/13/15   Shawnee Knapp, MD  clindamycin (CLEOCIN) 150 MG capsule Take 3 capsules (450 mg total) by mouth 3 (three) times daily. 05/11/15   Hannah Muthersbaugh, [name]  diphenoxylate-atropine (LOMOTIL) 2.5-0.025 MG per tablet Take 1 tablet by mouth 4 (four) times daily as needed for diarrhea or loose stools. Patient not taking: Reported on 05/13/2015 03/16/14   Gatha Mayer, MD  emtricitabine-tenofovir (TRUVADA) 200-300 MG per tablet Take 1 tablet by mouth daily. 10/02/14   Gatha Mayer, MD  guaiFENesin (MUCINEX) 600 MG 12 hr tablet Take 1,200 mg by mouth 2 (two) times daily as needed for cough or to loosen phlegm.    Historical Provider, MD  HYDROcodone-acetaminophen (NORCO/VICODIN) 5-325 MG tablet Take 1 tablet by mouth every 6 (six) hours as needed for moderate pain. 05/17/15   Shawnee Knapp, MD  lisinopril-hydrochlorothiazide (PRINZIDE,ZESTORETIC) 20-25 MG per tablet TAKE 1 TABLET BY MOUTH DAILY 02/22/15   Gatha Mayer, MD  methocarbamol (ROBAXIN) 500 MG tablet Take 1 tablet (500 mg total) by mouth 2 (two) times daily. 05/11/15   Hannah Muthersbaugh, [name]  mirtazapine (REMERON) 15 MG tablet Take 1 tablet (15 mg total) by mouth at bedtime. 02/22/15   Biagio Borg, MD  PARoxetine (PAXIL) 20 MG tablet TAKE 2 TABLETS BY MOUTH DAILY 02/22/15   Gatha Mayer, MD   BP 111/77 mmHg  Pulse 67  Temp(Src) 98.3 F (36.8 C) (Oral)  Resp 18  SpO2 97% Physical Exam  Constitutional: He is oriented to person, place, and time. He appears well-developed and well-nourished.  HENT:  Head: Normocephalic and atraumatic.  Cardiovascular: Normal rate and regular rhythm.   No murmur heard. Pulmonary/Chest: Effort normal and breath sounds normal. No respiratory distress.  Abdominal: Soft. There is no rebound and no guarding.  Mild epigastric tenderness  Genitourinary:  Nontender rectal exam, no gross blood.  Musculoskeletal: He exhibits no edema or tenderness.  Neurological: He is alert and oriented to person, place, and time.  Skin: Skin is warm and dry.  Psychiatric: He has a normal mood and affect. His behavior is normal.  Nursing note and vitals reviewed.   ED Course  Procedures (including critical care time) Labs Review Labs Reviewed  LIPASE, BLOOD - Abnormal; Notable for the following:    Lipase 65 (*)    All other components within normal limits  COMPREHENSIVE  METABOLIC PANEL - Abnormal; Notable for the following:    Total Protein 8.2 (*)    All other components within normal limits  CBC  URINALYSIS, ROUTINE W REFLEX MICROSCOPIC (NOT AT [facility])    Imaging Review No results found. I have personally reviewed and evaluated these images and lab results as part of my medical decision-making.   EKG Interpretation None      MDM   Final diagnoses:  Epigastric abdominal pain  Vomiting and diarrhea    Patient with history of Crohn's disease here with epigastric pain, vomiting, diarrhea after recent antibiotic use. No fevers. Abdominal examination is benign. Presentation is not consistent with acute inflammatory bowel process, peptic ulcer, bowel obstruction. he is feeling improved in the  emergency department after IV fluids, antiemetics.  Discussed home care, outpatient follow-up, return precautions.   Quintella Reichert, MD 06/03/15 [phone]

## 2015-06-05 ENCOUNTER — Telehealth: Payer: Self-pay

## 2015-06-05 DIAGNOSIS — K50818 Crohn's disease of both small and large intestine with other complication: Secondary | ICD-10-CM

## 2015-06-05 MED ORDER — DIPHENOXYLATE-ATROPINE 2.5-0.025 MG PO TABS
1.0000 | ORAL_TABLET | Freq: Four times a day (QID) | ORAL | Status: DC | PRN
Start: 2015-06-05 — End: 2016-06-24

## 2015-06-05 MED ORDER — ONDANSETRON HCL 4 MG PO TABS: 4.0000 mg | ORAL_TABLET | Freq: Four times a day (QID) | ORAL | Status: DC

## 2015-06-05 NOTE — Telephone Encounter (Signed)
-----   Message from Gatha Mayer, MD sent at 06/05/2015  1:00 PM EST ----- Regarding: Mohamed We can Rx Lomotil - refill what he has or # 60 1-2 q 4  Also can refill Zofran if he needs  Keep the Feb appt but see if he is able to come next Mon at 4 or see an APP this week or next

## 2015-06-10 ENCOUNTER — Other Ambulatory Visit: Payer: Self-pay | Admitting: Internal Medicine

## 2015-06-10 ENCOUNTER — Ambulatory Visit: Payer: BLUE CROSS/BLUE SHIELD | Admitting: Internal Medicine

## 2015-06-10 ENCOUNTER — Encounter: Payer: Self-pay | Admitting: Internal Medicine

## 2015-06-10 ENCOUNTER — Telehealth: Payer: Self-pay | Admitting: Internal Medicine

## 2015-06-10 DIAGNOSIS — R197 Diarrhea, unspecified: Secondary | ICD-10-CM

## 2015-06-10 NOTE — Telephone Encounter (Signed)
Epigastric pain is bothering him - had strep and URI with bad coughing and back pain late November  Had Rx for Vicodin which helped but still has some cough and fair amount of epigastric pain wuith coughing   Watery diarrhea since taking clinidamycin   Needs C diff PCR  We can refill his vicodin x 1  Needs REV me in Jan  No refills Vicodin before then

## 2015-06-10 NOTE — Telephone Encounter (Signed)
Please see mychart message from patient. Please advise as to scheduling. Best # [phone]

## 2015-06-11 MED ORDER — HYDROCODONE-ACETAMINOPHEN 5-325 MG PO TABS: 1.0000 | ORAL_TABLET | Freq: Four times a day (QID) | ORAL | Status: DC | PRN

## 2015-06-11 NOTE — Telephone Encounter (Signed)
Spoke with [name] [name] and told him that I will order the stool test so he can pick up the container when he comes today.  I will call him back when rx ready for pick up as Dr Carlean Purl is at the hospital this AM working.  He made a Jan. 10th appointment.

## 2015-06-11 NOTE — Telephone Encounter (Signed)
Called and left him voice mail that rx is ready and up front.

## 2015-07-02 ENCOUNTER — Ambulatory Visit (INDEPENDENT_AMBULATORY_CARE_PROVIDER_SITE_OTHER): Payer: BLUE CROSS/BLUE SHIELD | Admitting: Internal Medicine

## 2015-07-02 ENCOUNTER — Encounter: Payer: Self-pay | Admitting: Internal Medicine

## 2015-07-02 VITALS — BP 116/80 | HR 88 | Ht 67.0 in | Wt 253.2 lb

## 2015-07-02 DIAGNOSIS — R1115 Cyclical vomiting syndrome unrelated to migraine: Secondary | ICD-10-CM

## 2015-07-02 DIAGNOSIS — K5000 Crohn's disease of small intestine without complications: Secondary | ICD-10-CM

## 2015-07-02 DIAGNOSIS — G43A0 Cyclical vomiting, not intractable: Secondary | ICD-10-CM

## 2015-07-02 DIAGNOSIS — G4700 Insomnia, unspecified: Secondary | ICD-10-CM | POA: Diagnosis not present

## 2015-07-02 DIAGNOSIS — R1012 Left upper quadrant pain: Secondary | ICD-10-CM

## 2015-07-02 DIAGNOSIS — R1011 Right upper quadrant pain: Secondary | ICD-10-CM

## 2015-07-02 MED ORDER — MIRTAZAPINE 30 MG PO TABS: 30.0000 mg | ORAL_TABLET | Freq: Every day | ORAL | Status: DC

## 2015-07-02 MED ORDER — COLESTIPOL HCL 5 G PO GRAN: 5.0000 g | GRANULES | Freq: Every day | ORAL | Status: DC

## 2015-07-02 NOTE — Progress Notes (Signed)
   Subjective:    Patient ID: [name] [name], male    DOB: 1976/01/06, 40 y.o.   MRN: [id number] Chief complaint: Follow-up Crohn's disease HPI [name] [name] is here for follow-up. He remains on Humira. He is using 2 Lomotil a day 1 twice a day. He has 3-5 loose or watery stools a day sometimes formed. 2 or 3 times a week he is nauseous and vomits in the morning. He has chronic recurrent bilateral upper abdominal pain. He does not have any bleeding. He was in the emergency department in December and labs were okay CBC lipase and metabolic panel with a slight elevation of lipase and total protein only.  Medications, allergies, past medical history, past surgical history, family history and social history are reviewed and updated in the EMR.  Review of Systems He reports that he still not sleeping well. He has finished using narcotic pain medication that was prescribed back in December.    Objective:   Physical Exam @BP  116/80 mmHg  Pulse 88  Ht 5' 7"  (1.702 m)  Wt 253 lb 4 oz (114.873 kg)  BMI 39.66 kg/m2@  General:  NAD Eyes:   anicteric Lungs:  clear Heart::  S1S2 no rubs, murmurs or gallops Abdomen:  soft and tender upper abd wall pain, BS+, small ventral incisional hernia  Data Reviewed:  As above     Assessment & Plan:   1. Crohn's ileitis, without complications ([place])   2. Abdominal wall pain in both upper quadrants   3. Insomnia   4. Non-intractable cyclical vomiting with nausea    I don't think his Crohn's is active at this time or at least not causing the symptoms necessarily so. 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 He does seem to think that colestipol used in the past helped. I will reinstitute that. 5 g with supper.  For insomnia increased mirtazapine to 30 mg at bedtime. This may help his nausea as well and hopefully reduce the vomiting  For the abdominal wall pain he believes he has some diclofenac gel at home and he  will try that. He is instructed that he should try to braces abdominal wall and hopefully if we can stop the nausea and vomiting, and some of the coughing he has been having, that will improve. I explained that narcotics though they help his pain, are not the best choice to continue to try to treat this pain. He will return in 4 months for follow-up sooner if needed.  I appreciate the opportunity to care for this patient.

## 2015-07-02 NOTE — Patient Instructions (Addendum)
   We have sent the following medications to your pharmacy for you to pick up at your convenience: Mirtazapine, colestid   You can use up your mirtazapine 15 mg tablets before getting your new rx.   Use the generic Votaren gel on his upper abdominal pain.   Follow up with Dr Carlean Purl in 4 months.   I appreciate the opportunity to care for you.

## 2015-07-24 ENCOUNTER — Other Ambulatory Visit: Payer: Self-pay | Admitting: Internal Medicine

## 2015-07-25 NOTE — Telephone Encounter (Signed)
Please advise, patient needs refill on Xanax.

## 2015-07-25 NOTE — Telephone Encounter (Signed)
Medication printed signed and faxed to pharmacy

## 2015-07-25 NOTE — Telephone Encounter (Signed)
Done hardcopy to Corinne  

## 2015-07-30 ENCOUNTER — Other Ambulatory Visit: Payer: Self-pay | Admitting: Internal Medicine

## 2015-07-30 NOTE — Telephone Encounter (Signed)
Please advise, thanks.

## 2015-08-01 ENCOUNTER — Ambulatory Visit: Payer: BLUE CROSS/BLUE SHIELD | Admitting: Internal Medicine

## 2015-08-26 ENCOUNTER — Other Ambulatory Visit: Payer: Self-pay | Admitting: Internal Medicine

## 2015-08-26 NOTE — Telephone Encounter (Signed)
Please advise Sir, thank you. 

## 2015-08-27 NOTE — Telephone Encounter (Signed)
Done hardcopy to Corinne  

## 2015-08-27 NOTE — Telephone Encounter (Signed)
He can have 3 RF but needs to schedule a PCP f/u to get more of these

## 2015-08-27 NOTE — Telephone Encounter (Signed)
Please advise, thanks.

## 2015-09-04 ENCOUNTER — Encounter (HOSPITAL_BASED_OUTPATIENT_CLINIC_OR_DEPARTMENT_OTHER): Payer: BLUE CROSS/BLUE SHIELD

## 2015-09-13 ENCOUNTER — Encounter: Payer: Self-pay | Admitting: Internal Medicine

## 2015-09-13 ENCOUNTER — Ambulatory Visit (INDEPENDENT_AMBULATORY_CARE_PROVIDER_SITE_OTHER): Payer: BLUE CROSS/BLUE SHIELD | Admitting: Internal Medicine

## 2015-09-13 VITALS — BP 104/70 | HR 98 | Temp 98.2°F | Wt 255.0 lb

## 2015-09-13 DIAGNOSIS — J069 Acute upper respiratory infection, unspecified: Secondary | ICD-10-CM | POA: Insufficient documentation

## 2015-09-13 MED ORDER — AZITHROMYCIN 250 MG PO TABS: ORAL_TABLET | ORAL | Status: DC

## 2015-09-13 MED ORDER — PROMETHAZINE-CODEINE 6.25-10 MG/5ML PO SYRP: 5.0000 mL | ORAL_SOLUTION | ORAL | Status: DC | PRN

## 2015-09-13 NOTE — Progress Notes (Signed)
Subjective:  Patient ID: [name] [name], male    DOB: May 25, 1976  Age: 40 y.o. MRN: [id number]  CC: Sore Throat and Dysphagia   HPI SALIOU BARNIER presents for ST, cough x 2 d  Outpatient Prescriptions Prior to Visit  Medication Sig Dispense Refill  . acetaminophen (TYLENOL) 325 MG tablet Take 650 mg by mouth every 6 (six) hours as needed for moderate pain.    Marland Kitchen acidophilus (RISAQUAD) CAPS capsule Take 1 capsule by mouth daily.    . Adalimumab 40 MG/0.8ML PNKT Inject 40 mg into the skin every 14 (fourteen) days. 2 each 6  . alprazolam (XANAX) 2 MG tablet TAKE 1/2 TABLET BY MOUTH TWICE DAILY AS NEEDED 30 tablet 3  . ALPRAZOLAM XR 3 MG 24 hr tablet TAKE 1 TABLET BY MOUTH EVERY MORNING 30 tablet 2  . colestipol (COLESTID) 5 g granules Take 5 g by mouth daily with supper. 500 g 12  . diphenoxylate-atropine (LOMOTIL) 2.5-0.025 MG tablet Take 1 tablet by mouth 4 (four) times daily as needed for diarrhea or loose stools. 90 tablet 0  . emtricitabine-tenofovir (TRUVADA) 200-300 MG per tablet Take 1 tablet by mouth daily. 90 tablet 3  . lisinopril-hydrochlorothiazide (PRINZIDE,ZESTORETIC) 20-25 MG tablet TAKE 1 TABLET BY MOUTH DAILY 30 tablet 2  . mirtazapine (REMERON) 30 MG tablet Take 1 tablet (30 mg total) by mouth at bedtime. 90 tablet 3  . ondansetron (ZOFRAN) 4 MG tablet TAKE 1 TABLET BY MOUTH EVERY 8 HOURS AS NEEDED FOR NAUSEA OR VOMITING. 30 tablet 1  . PARoxetine (PAXIL) 20 MG tablet TAKE 2 TABLETS BY MOUTH DAILY 60 tablet 2   No facility-administered medications prior to visit.    ROS Review of Systems  Constitutional: Negative for appetite change, fatigue and unexpected weight change.  HENT: Positive for congestion, sore throat and trouble swallowing. Negative for nosebleeds, sinus pressure and sneezing.   Eyes: Negative for itching and visual disturbance.  Respiratory: Positive for cough.   Cardiovascular: Negative for chest pain, palpitations and leg swelling.    Gastrointestinal: Negative for nausea, diarrhea, blood in stool and abdominal distention.  Genitourinary: Negative for frequency and hematuria.  Musculoskeletal: Negative for back pain, joint swelling, gait problem and neck pain.  Skin: Negative for rash.  Neurological: Negative for dizziness, tremors, speech difficulty and weakness.  Psychiatric/Behavioral: Negative for sleep disturbance, dysphoric mood and agitation. The patient is not nervous/anxious.     Objective:  BP 104/70 mmHg  Pulse 98  Temp(Src) 98.2 F (36.8 C) (Oral)  Wt 255 lb (115.667 kg)  SpO2 94%  BP Readings from Last 3 Encounters:  09/13/15 104/70  07/02/15 116/80  06/03/15 117/66    Wt Readings from Last 3 Encounters:  09/13/15 255 lb (115.667 kg)  07/02/15 253 lb 4 oz (114.873 kg)  05/13/15 249 lb 6.4 oz (113.127 kg)    Physical Exam  Constitutional: He is oriented to person, place, and time. He appears well-developed. No distress.  NAD  HENT:  Mouth/Throat: Oropharynx is clear and moist.  Eyes: Conjunctivae are normal. Pupils are equal, round, and reactive to light.  Neck: Normal range of motion. No JVD present. No thyromegaly present.  Cardiovascular: Normal rate, regular rhythm, normal heart sounds and intact distal pulses.  Exam reveals no gallop and no friction rub.   No murmur heard. Pulmonary/Chest: Effort normal and breath sounds normal. No respiratory distress. He has no wheezes. He has no rales. He exhibits no tenderness.  Abdominal: Soft. Bowel sounds are normal.  He exhibits no distension and no mass. There is no tenderness. There is no rebound and no guarding.  Musculoskeletal: Normal range of motion. He exhibits no edema or tenderness.  Lymphadenopathy:    He has no cervical adenopathy.  Neurological: He is alert and oriented to person, place, and time. He has normal reflexes. No cranial nerve deficit. He exhibits normal muscle tone. He displays a negative Romberg sign. Coordination and  gait normal.  Skin: Skin is warm and dry. No rash noted.  Psychiatric: He has a normal mood and affect. His behavior is normal. Judgment and thought content normal.  eryth swollen pharynx Strep (-)  Lab Results  Component Value Date   WBC 8.7 06/03/2015   HGB 14.2 06/03/2015   HCT 43.0 06/03/2015   PLT 298 06/03/2015   GLUCOSE 93 06/03/2015   ALT 45 06/03/2015   AST 37 06/03/2015   NA 136 06/03/2015   K 3.9 06/03/2015   CL 102 06/03/2015   CREATININE 0.89 06/03/2015   BUN 12 06/03/2015   CO2 27 06/03/2015   TSH 2.42 04/10/2011    No results found.  Assessment & Plan:   There are no diagnoses linked to this encounter. I am having Mr. Peggs maintain his emtricitabine-tenofovir, Adalimumab, acidophilus, acetaminophen, diphenoxylate-atropine, mirtazapine, colestipol, ALPRAZOLAM XR, ondansetron, lisinopril-hydrochlorothiazide, PARoxetine, and alprazolam.  No orders of the defined types were placed in this encounter.     Follow-up: No Follow-up on file.  Walker Kehr, MD

## 2015-09-13 NOTE — Assessment & Plan Note (Signed)
Strep test

## 2015-09-13 NOTE — Patient Instructions (Signed)
Use over-the-counter  "cold" medicines  such as "Tylenol cold" , "Advil cold",  "Mucinex" or" Mucinex D"  for cough and congestion.   Avoid decongestants if you have high blood pressure and use "Afrin" nasal spray for nasal congestion as directed instead. Use" Delsym" or" Robitussin" cough syrup varietis for cough.  You can use plain "Tylenol" or "Advil" for fever, chills and achyness. Use Halls or Ricola cough drops.   "Common cold" symptoms are usually triggered by a virus.  The antibiotics are usually not necessary. On average, a" viral cold" illness would take 4-7 days to resolve. Please, make an appointment if you are not better or if you're worse.  

## 2015-09-13 NOTE — Progress Notes (Signed)
Pre visit review using our clinic review tool, if applicable. No additional management support is needed unless otherwise documented below in the visit note. 

## 2015-09-16 ENCOUNTER — Other Ambulatory Visit: Payer: Self-pay | Admitting: Internal Medicine

## 2015-09-17 NOTE — Telephone Encounter (Signed)
Xanax appears too soon, as a 3 mo rx was done Jul 25 2015

## 2015-09-17 NOTE — Telephone Encounter (Signed)
Please advise, patient is requesting refill on medication

## 2015-09-19 ENCOUNTER — Other Ambulatory Visit: Payer: Self-pay | Admitting: Internal Medicine

## 2015-09-20 ENCOUNTER — Encounter: Payer: Self-pay | Admitting: Internal Medicine

## 2015-09-20 NOTE — Telephone Encounter (Signed)
[name] [name] to see above  OK to encourage pt to come to sat clinic

## 2015-09-24 NOTE — Pre-Procedure Instructions (Addendum)
    DONYELL DING  09/24/2015      [id number] [place] [address], [place] [address] [place] [address] Phone: [phone] Fax: [phone]  [facility] [place], [place] [address] Phone: [phone] Fax: [phone] [place], [place] - [place] [address] [place] [address] Phone: [phone] Fax: [phone]    Your procedure is scheduled on Monday April 10th  Report to [facility] Admitting at 9:40 am  Call this number if you have problems the morning of surgery:  [phone]   Remember:  Do not eat food or drink liquids after midnight.  Take these medicines the morning of surgery with A SIP OF WATER: Xanax if needed, Paxil,Truvada, Tylenol if needed, Zofran if needed  Stop taking all Aspirin or Aspirin containing products, anti-inflammatory medications such as Ibuprofen, Aleve, Advil, Motrin, Naproxen, all vitamins and mineral supplements, probiotics, and herbal supplements   Do not wear jewelry, make-up or nail polish.  Do not wear lotions, powders, or perfumes.  You may wear deodorant.  Do not shave 48 hours prior to surgery.  Men may shave face and neck.  Do not bring valuables to the hospital.  [facility] is not responsible for any belongings or valuables.  Contacts, dentures or bridgework may not be worn into surgery.  Leave your suitcase in the car.  After surgery it may be brought to your room.  Patients discharged the day of surgery will not be allowed to drive home.   Special instructions:  Shower with CHG as instructed  Please read over the following fact sheets that you were given. Pain Booklet, Coughing and Deep Breathing and Surgical Site Infection Prevention

## 2015-09-25 ENCOUNTER — Encounter (HOSPITAL_COMMUNITY): Payer: Self-pay

## 2015-09-25 ENCOUNTER — Encounter (HOSPITAL_COMMUNITY)
Admission: RE | Admit: 2015-09-25 | Discharge: 2015-09-25 | Disposition: A | Discharge status: Home / Self Care | Payer: BLUE CROSS/BLUE SHIELD | Source: Ambulatory Visit | Attending: Otolaryngology | Admitting: Otolaryngology

## 2015-09-25 DIAGNOSIS — Z01818 Encounter for other preprocedural examination: Secondary | ICD-10-CM | POA: Diagnosis present

## 2015-09-25 DIAGNOSIS — J3501 Chronic tonsillitis: Secondary | ICD-10-CM | POA: Diagnosis not present

## 2015-09-25 DIAGNOSIS — Z01812 Encounter for preprocedural laboratory examination: Secondary | ICD-10-CM | POA: Diagnosis not present

## 2015-09-25 LAB — BASIC METABOLIC PANEL
ANION GAP: 13 (ref 5–15)
BUN: 16 mg/dL (ref 6–20)
CHLORIDE: 100 mmol/L — AB (ref 101–111)
CO2: 24 mmol/L (ref 22–32)
Calcium: 9.4 mg/dL (ref 8.9–10.3)
Creatinine, Ser: 0.85 mg/dL (ref 0.61–1.24)
Glucose, Bld: 110 mg/dL — ABNORMAL HIGH (ref 65–99)
Potassium: 4 mmol/L (ref 3.5–5.1)
SODIUM: 137 mmol/L (ref 135–145)

## 2015-09-25 LAB — CBC
HEMATOCRIT: 45.7 % (ref 39.0–52.0)
HEMOGLOBIN: 15.3 g/dL (ref 13.0–17.0)
MCH: 31.4 pg (ref 26.0–34.0)
MCHC: 33.5 g/dL (ref 30.0–36.0)
MCV: 93.6 fL (ref 78.0–100.0)
Platelets: 230 10*3/uL (ref 150–400)
RBC: 4.88 MIL/uL (ref 4.22–5.81)
RDW: 14.3 % (ref 11.5–15.5)
WBC: 8.5 10*3/uL (ref 4.0–10.5)

## 2015-09-25 NOTE — Progress Notes (Signed)
Pt denies cardiac history other than hypertension.  Does not have a cardiologist.  Denies ever having any heart studies done.  PCP Dr Cathlean Cower in [place], last seen a few months ago  EKG done today  Sleep study done recently with [facility] per Dr Constance Holster.  Will request results.  States does not use a CPAP.

## 2015-09-26 ENCOUNTER — Ambulatory Visit: Payer: Self-pay | Admitting: Otolaryngology

## 2015-09-26 NOTE — H&P (Signed)
  Raelle Chambers H, MD - 05/29/2015 9:00 AM EST Assessment  Obstructive sleep apnea (327.23) (G47.33). Crohn disease (555.9) (K50.90). Obesity (278.00) (E66.9). Recurrent streptococcal tonsillitis (034.0) (J03.01). Orders  Sleep Study; Sleep Study (PSG); Requested for: 29 May 2015. Discussed  Long-standing recurrent strep tonsillitis. Recommend consideration for tonsillectomy. He has a history of severe sleep apnea and is unable to use CPAP. The last sleep study was about 8 years ago and since then he has been on about 10 or 15 more pounds. I would recommend a full sleep study to evaluate his anesthetic risk and possibly consider additional pharyngeal surgery. Reason For Visit  Drako Brandel is here today at the kind request of JOHN, JAMES for consultation and opinion. Pt here for evaluation of recurring strep throat and snoring. HPI  History of streptococcal tonsillitis, 1-2 times annually for most of his life. He does recently had 2 more episodes. He has a history of sleep apnea, tried CPAP for several months but was never able to use it successfully. This was about 8 years ago. He has gained about 10 or 15 more pounds since then. He has struggled with his weight his entire life. He also suffers with Crohn's disease and is on Humera for that. Allergies  No Known Drug Allergies. Current Meds  Xanax TABS (ALPRAZolam);; RPT Paxil TABS (PARoxetine HCl);; RPT Remeron TABS (Mirtazapine);; RPT Lisinopril TABS;; RPT Humira Pen KIT;; RPT. Active Problems  Crohn disease (555.9) (K50.90) Hypertension (401.9) (I10) Obstructive sleep apnea (327.23) (G47.33). PSH  Foot Surgery Gastric Surgery Oral Surgery. Family Hx  Family history of cardiac disorder (V17.49) (Z82.49) Family history of cerebrovascular accident (CVA) (V17.1) (Z82.3) Family history of hypertension (V17.49) (Z82.49) No pertinent family history: Mother. Personal Hx  Caffeine use (V49.89) (F15.90) Cigar smoker (305.1)  (F17.290) Daily alcohol use. Light tobacco smoker (305.1) (F17.200). ROS  12 system ROS was obtained and reviewed on the Health Maintenance form dated today. Positive responses are shown above. If the symptom is not checked, the patient has denied it. Vital Signs  Adult Vitals Recorded by Kreis, Tracy on December 07,2016 09:16 AM BP: 125/80 mm Hg  Height: 5 ft 9 in, Weight: 246 lb , BMI: 36.33 , BSA: 2.26  . Physical Exam  APPEARANCE: Well developed, obese gentleman, in no acute distress. Normal affect, in a pleasant mood. Oriented to time, place and person. COMMUNICATION: Normal voice  HEAD & FACE: No scars, lesions or masses of head and face. Sinuses nontender to palpation. Salivary glands without mass or tenderness. Facial strength symmetric. No facial lesion, scars, or mass. EYES: EOMI with normal primary gaze alignment. Visual acuity grossly intact. PERRLA EXTERNAL EAR & NOSE: No scars, lesions or masses  EAC & TYMPANIC MEMBRANE: EAC shows no obstructing lesions or debris and tympanic membranes are normal bilaterally with good movement to insufflation. GROSS HEARING: Normal  TMJ: Nontender  INTRANASAL EXAM: No polyps or purulence.  NASOPHARYNX: Normal, without lesions. LIPS, TEETH & GUMS: No lip lesions, normal dentition and normal gums. ORAL CAVITY/OROPHARYNX: Oral mucosa moist without lesion or asymmetry of the palate, tongue, tonsil or posterior pharynx. Tonsils are 3+. There is a lot of redundancy of the pharyngeal mucosa with restricted airways. NECK: Supple without adenopathy or mass. THYROID: Normal with no masses palpable.  NEUROLOGIC: No gross CN deficits. No nystagmus noted.  LYMPHATIC: No enlarged nodes palpable.  

## 2015-09-27 MED ORDER — LACTATED RINGERS IV SOLN
INTRAVENOUS | Status: DC
  Administered 2015-09-30 (×2): via INTRAVENOUS

## 2015-09-30 ENCOUNTER — Encounter (HOSPITAL_COMMUNITY): Payer: Self-pay | Admitting: *Deleted

## 2015-09-30 ENCOUNTER — Observation Stay (HOSPITAL_COMMUNITY)
Admission: RE | Admit: 2015-09-30 | Discharge: 2015-10-01 | Disposition: A | Discharge status: Home / Self Care | Payer: BLUE CROSS/BLUE SHIELD | Source: Ambulatory Visit | Attending: Otolaryngology | Admitting: Otolaryngology

## 2015-09-30 ENCOUNTER — Encounter (HOSPITAL_COMMUNITY)
Admission: RE | Disposition: A | Discharge status: Home / Self Care | Payer: Self-pay | Source: Ambulatory Visit | Attending: Otolaryngology

## 2015-09-30 ENCOUNTER — Ambulatory Visit (HOSPITAL_COMMUNITY): Payer: BLUE CROSS/BLUE SHIELD | Admitting: Certified Registered"

## 2015-09-30 DIAGNOSIS — F17290 Nicotine dependence, other tobacco product, uncomplicated: Secondary | ICD-10-CM | POA: Insufficient documentation

## 2015-09-30 DIAGNOSIS — Z6836 Body mass index (BMI) 36.0-36.9, adult: Secondary | ICD-10-CM | POA: Diagnosis not present

## 2015-09-30 DIAGNOSIS — Z79899 Other long term (current) drug therapy: Secondary | ICD-10-CM | POA: Insufficient documentation

## 2015-09-30 DIAGNOSIS — I10 Essential (primary) hypertension: Secondary | ICD-10-CM | POA: Diagnosis not present

## 2015-09-30 DIAGNOSIS — K5090 Crohn's disease, unspecified, without complications: Secondary | ICD-10-CM | POA: Insufficient documentation

## 2015-09-30 DIAGNOSIS — J3501 Chronic tonsillitis: Principal | ICD-10-CM | POA: Insufficient documentation

## 2015-09-30 DIAGNOSIS — F329 Major depressive disorder, single episode, unspecified: Secondary | ICD-10-CM | POA: Insufficient documentation

## 2015-09-30 DIAGNOSIS — E669 Obesity, unspecified: Secondary | ICD-10-CM | POA: Diagnosis not present

## 2015-09-30 DIAGNOSIS — G4733 Obstructive sleep apnea (adult) (pediatric): Secondary | ICD-10-CM | POA: Diagnosis not present

## 2015-09-30 HISTORY — PX: TONSILLECTOMY: SHX5217

## 2015-09-30 LAB — MRSA PCR SCREENING: MRSA by PCR: NEGATIVE

## 2015-09-30 SURGERY — TONSILLECTOMY
Anesthesia: General | Site: Mouth | Laterality: Bilateral

## 2015-09-30 MED ORDER — FENTANYL CITRATE (PF) 250 MCG/5ML IJ SOLN
INTRAMUSCULAR | Status: AC
  Filled 2015-09-30: qty 5

## 2015-09-30 MED ORDER — EMTRICITABINE-TENOFOVIR AF 200-25 MG PO TABS
1.0000 | ORAL_TABLET | Freq: Every day | ORAL | Status: DC
  Administered 2015-09-30 – 2015-10-01 (×2): 1 via ORAL
  Filled 2015-09-30 (×3): qty 1

## 2015-09-30 MED ORDER — 0.9 % SODIUM CHLORIDE (POUR BTL) OPTIME
TOPICAL | Status: DC | PRN
  Administered 2015-09-30: 1000 mL

## 2015-09-30 MED ORDER — GLYCOPYRROLATE 0.2 MG/ML IJ SOLN
INTRAMUSCULAR | Status: DC | PRN
  Administered 2015-09-30: 0.2 mg via INTRAVENOUS

## 2015-09-30 MED ORDER — BACITRACIN ZINC 500 UNIT/GM EX OINT
1.0000 "application " | TOPICAL_OINTMENT | Freq: Three times a day (TID) | CUTANEOUS | Status: DC
  Filled 2015-09-30: qty 28.35

## 2015-09-30 MED ORDER — ONDANSETRON HCL 4 MG/2ML IJ SOLN
INTRAMUSCULAR | Status: AC
  Filled 2015-09-30: qty 2

## 2015-09-30 MED ORDER — FENTANYL CITRATE (PF) 100 MCG/2ML IJ SOLN
INTRAMUSCULAR | Status: DC | PRN
  Administered 2015-09-30: 25 ug via INTRAVENOUS
  Administered 2015-09-30: 50 ug via INTRAVENOUS
  Administered 2015-09-30: 100 ug via INTRAVENOUS

## 2015-09-30 MED ORDER — PAROXETINE HCL 20 MG PO TABS
40.0000 mg | ORAL_TABLET | Freq: Every day | ORAL | Status: DC
  Administered 2015-09-30 – 2015-10-01 (×2): 40 mg via ORAL
  Filled 2015-09-30 (×3): qty 2

## 2015-09-30 MED ORDER — PHENOL 1.4 % MT LIQD
1.0000 | OROMUCOSAL | Status: DC | PRN
Start: 2015-09-30 — End: 2015-10-01
  Filled 2015-09-30 (×2): qty 177

## 2015-09-30 MED ORDER — MIDAZOLAM HCL 2 MG/2ML IJ SOLN
INTRAMUSCULAR | Status: AC
  Filled 2015-09-30: qty 2

## 2015-09-30 MED ORDER — ACETAMINOPHEN 325 MG PO TABS
650.0000 mg | ORAL_TABLET | Freq: Four times a day (QID) | ORAL | Status: DC | PRN
  Administered 2015-09-30: 650 mg via ORAL
  Filled 2015-09-30: qty 2

## 2015-09-30 MED ORDER — DEXAMETHASONE SODIUM PHOSPHATE 4 MG/ML IJ SOLN
INTRAMUSCULAR | Status: DC | PRN
  Administered 2015-09-30: 4 mg via INTRAVENOUS

## 2015-09-30 MED ORDER — HYDROMORPHONE HCL 1 MG/ML IJ SOLN
0.2500 mg | INTRAMUSCULAR | Status: DC | PRN
  Administered 2015-09-30: 1 mg via INTRAVENOUS

## 2015-09-30 MED ORDER — HYDROCODONE-ACETAMINOPHEN 7.5-325 MG/15ML PO SOLN
10.0000 mL | ORAL | Status: DC | PRN
  Administered 2015-09-30 – 2015-10-01 (×5): 15 mL via ORAL
  Filled 2015-09-30 (×4): qty 15

## 2015-09-30 MED ORDER — NEOSTIGMINE METHYLSULFATE 10 MG/10ML IV SOLN
INTRAVENOUS | Status: AC
  Filled 2015-09-30: qty 1

## 2015-09-30 MED ORDER — HYDROCHLOROTHIAZIDE 25 MG PO TABS
25.0000 mg | ORAL_TABLET | Freq: Every day | ORAL | Status: DC
  Administered 2015-09-30 – 2015-10-01 (×2): 25 mg via ORAL
  Filled 2015-09-30 (×3): qty 1

## 2015-09-30 MED ORDER — ONDANSETRON 4 MG PO TBDP: 4.0000 mg | ORAL_TABLET | Freq: Three times a day (TID) | ORAL | Status: DC | PRN

## 2015-09-30 MED ORDER — HYDROCODONE-ACETAMINOPHEN 7.5-325 MG/15ML PO SOLN
ORAL | Status: AC
  Filled 2015-09-30: qty 15

## 2015-09-30 MED ORDER — SUCCINYLCHOLINE 20MG/ML (10ML) SYRINGE FOR MEDFUSION PUMP - OPTIME
INTRAMUSCULAR | Status: DC | PRN
  Administered 2015-09-30: 140 mg via INTRAVENOUS

## 2015-09-30 MED ORDER — ONDANSETRON HCL 4 MG/2ML IJ SOLN
INTRAMUSCULAR | Status: DC | PRN
  Administered 2015-09-30: 4 mg via INTRAVENOUS

## 2015-09-30 MED ORDER — COLESTIPOL HCL 1 G PO TABS
5.0000 g | ORAL_TABLET | Freq: Every day | ORAL | Status: DC
  Filled 2015-09-30: qty 5

## 2015-09-30 MED ORDER — SUGAMMADEX SODIUM 500 MG/5ML IV SOLN
INTRAVENOUS | Status: AC
  Filled 2015-09-30: qty 5

## 2015-09-30 MED ORDER — OXYCODONE HCL 5 MG/5ML PO SOLN: 5.0000 mg | Freq: Once | ORAL | Status: DC | PRN

## 2015-09-30 MED ORDER — ONDANSETRON HCL 4 MG/2ML IJ SOLN: 4.0000 mg | Freq: Four times a day (QID) | INTRAMUSCULAR | Status: DC | PRN

## 2015-09-30 MED ORDER — PROPOFOL 10 MG/ML IV BOLUS
INTRAVENOUS | Status: DC | PRN
  Administered 2015-09-30: 200 mg via INTRAVENOUS
  Administered 2015-09-30: 50 mg via INTRAVENOUS

## 2015-09-30 MED ORDER — HYDROMORPHONE HCL 1 MG/ML IJ SOLN
INTRAMUSCULAR | Status: AC
  Filled 2015-09-30: qty 1

## 2015-09-30 MED ORDER — ONDANSETRON HCL 4 MG PO TABS: 8.0000 mg | ORAL_TABLET | Freq: Three times a day (TID) | ORAL | Status: DC | PRN

## 2015-09-30 MED ORDER — OXYCODONE HCL 5 MG PO TABS: 5.0000 mg | ORAL_TABLET | Freq: Once | ORAL | Status: DC | PRN

## 2015-09-30 MED ORDER — PHENYLEPHRINE 40 MCG/ML (10ML) SYRINGE FOR IV PUSH (FOR BLOOD PRESSURE SUPPORT)
PREFILLED_SYRINGE | INTRAVENOUS | Status: AC
  Filled 2015-09-30: qty 10

## 2015-09-30 MED ORDER — ROCURONIUM BROMIDE 100 MG/10ML IV SOLN
INTRAVENOUS | Status: DC | PRN
  Administered 2015-09-30: 20 mg via INTRAVENOUS
  Administered 2015-09-30: 10 mg via INTRAVENOUS

## 2015-09-30 MED ORDER — LISINOPRIL 20 MG PO TABS
20.0000 mg | ORAL_TABLET | Freq: Every day | ORAL | Status: DC
  Administered 2015-09-30 – 2015-10-01 (×2): 20 mg via ORAL
  Filled 2015-09-30 (×3): qty 1

## 2015-09-30 MED ORDER — RISAQUAD PO CAPS
1.0000 | ORAL_CAPSULE | Freq: Every day | ORAL | Status: DC
  Administered 2015-10-01: 1 via ORAL
  Filled 2015-09-30 (×2): qty 1

## 2015-09-30 MED ORDER — DEXAMETHASONE SODIUM PHOSPHATE 10 MG/ML IJ SOLN
INTRAMUSCULAR | Status: AC
  Filled 2015-09-30: qty 1

## 2015-09-30 MED ORDER — DIPHENOXYLATE-ATROPINE 2.5-0.025 MG PO TABS
1.0000 | ORAL_TABLET | Freq: Four times a day (QID) | ORAL | Status: DC | PRN
  Filled 2015-09-30: qty 1

## 2015-09-30 MED ORDER — LISINOPRIL-HYDROCHLOROTHIAZIDE 20-25 MG PO TABS: 1.0000 | ORAL_TABLET | Freq: Every day | ORAL | Status: DC

## 2015-09-30 MED ORDER — HYDROCODONE-ACETAMINOPHEN 7.5-325 MG/15ML PO SOLN: 15.0000 mL | Freq: Four times a day (QID) | ORAL | Status: DC | PRN

## 2015-09-30 MED ORDER — SUCCINYLCHOLINE CHLORIDE 20 MG/ML IJ SOLN
INTRAMUSCULAR | Status: AC
  Filled 2015-09-30: qty 1

## 2015-09-30 MED ORDER — PROPOFOL 10 MG/ML IV BOLUS
INTRAVENOUS | Status: AC
  Filled 2015-09-30: qty 40

## 2015-09-30 MED ORDER — MIRTAZAPINE 15 MG PO TABS
30.0000 mg | ORAL_TABLET | Freq: Every day | ORAL | Status: DC
  Administered 2015-09-30: 30 mg via ORAL
  Filled 2015-09-30 (×2): qty 2

## 2015-09-30 MED ORDER — DEXTROSE-NACL 5-0.9 % IV SOLN
INTRAVENOUS | Status: DC
  Administered 2015-09-30 – 2015-10-01 (×2): via INTRAVENOUS

## 2015-09-30 MED ORDER — LIDOCAINE HCL (CARDIAC) 20 MG/ML IV SOLN
INTRAVENOUS | Status: AC
  Filled 2015-09-30: qty 5

## 2015-09-30 MED ORDER — ROCURONIUM BROMIDE 50 MG/5ML IV SOLN
INTRAVENOUS | Status: AC
  Filled 2015-09-30: qty 1

## 2015-09-30 MED ORDER — ADALIMUMAB 40 MG/0.8ML ~~LOC~~ AJKT: 40.0000 mg | AUTO-INJECTOR | SUBCUTANEOUS | Status: DC

## 2015-09-30 MED ORDER — LIDOCAINE HCL (CARDIAC) 20 MG/ML IV SOLN
INTRAVENOUS | Status: DC | PRN
  Administered 2015-09-30: 50 mg via INTRAVENOUS

## 2015-09-30 MED ORDER — GLYCOPYRROLATE 0.2 MG/ML IJ SOLN
INTRAMUSCULAR | Status: AC
  Filled 2015-09-30: qty 3

## 2015-09-30 MED ORDER — SUGAMMADEX SODIUM 500 MG/5ML IV SOLN
INTRAVENOUS | Status: DC | PRN
  Administered 2015-09-30: 100 mg via INTRAVENOUS
  Administered 2015-09-30: 225 mg via INTRAVENOUS

## 2015-09-30 MED ORDER — IBUPROFEN 100 MG/5ML PO SUSP
400.0000 mg | Freq: Four times a day (QID) | ORAL | Status: DC | PRN
  Administered 2015-09-30: 400 mg via ORAL
  Filled 2015-09-30 (×2): qty 20

## 2015-09-30 SURGICAL SUPPLY — 32 items
BLADE SURG 15 STRL LF DISP TIS (BLADE) IMPLANT
BLADE SURG 15 STRL SS (BLADE)
CANISTER SUCTION 2500CC (MISCELLANEOUS) ×2 IMPLANT
CATH ROBINSON RED A/P 10FR (CATHETERS) ×2 IMPLANT
CATH ROBINSON RED A/P 14FR (CATHETERS) ×2 IMPLANT
CLEANER TIP ELECTROSURG 2X2 (MISCELLANEOUS) ×2 IMPLANT
COAGULATOR SUCT SWTCH 10FR 6 (ELECTROSURGICAL) ×2 IMPLANT
CONT SPEC 4OZ CLIKSEAL STRL BL (MISCELLANEOUS) ×2 IMPLANT
DRAPE PROXIMA HALF (DRAPES) IMPLANT
ELECT COATED BLADE 2.86 ST (ELECTRODE) ×2 IMPLANT
ELECT REM PT RETURN 9FT ADLT (ELECTROSURGICAL) ×2
ELECTRODE REM PT RTRN 9FT ADLT (ELECTROSURGICAL) ×1 IMPLANT
GAUZE SPONGE 4X4 16PLY XRAY LF (GAUZE/BANDAGES/DRESSINGS) ×2 IMPLANT
GLOVE BIO SURGEON STRL SZ7 (GLOVE) ×2 IMPLANT
GLOVE ECLIPSE 7.5 STRL STRAW (GLOVE) ×4 IMPLANT
GOWN STRL REIN XL XLG (GOWN DISPOSABLE) ×2 IMPLANT
GOWN STRL REUS W/ TWL LRG LVL3 (GOWN DISPOSABLE) ×2 IMPLANT
GOWN STRL REUS W/TWL LRG LVL3 (GOWN DISPOSABLE) ×2
KIT BASIN OR (CUSTOM PROCEDURE TRAY) ×2 IMPLANT
KIT ROOM TURNOVER OR (KITS) ×2 IMPLANT
NEEDLE 27GAX1X1/2 (NEEDLE) IMPLANT
NS IRRIG 1000ML POUR BTL (IV SOLUTION) ×2 IMPLANT
PACK SURGICAL SETUP 50X90 (CUSTOM PROCEDURE TRAY) ×2 IMPLANT
PAD ARMBOARD 7.5X6 YLW CONV (MISCELLANEOUS) ×4 IMPLANT
PENCIL FOOT CONTROL (ELECTRODE) ×2 IMPLANT
SPECIMEN JAR SMALL (MISCELLANEOUS) ×4 IMPLANT
SPONGE TONSIL 1 RF SGL (DISPOSABLE) ×2 IMPLANT
SYR BULB 3OZ (MISCELLANEOUS) ×2 IMPLANT
TOWEL NATURAL 10PK STERILE (DISPOSABLE) ×2 IMPLANT
TUBE CONNECTING 12X1/4 (SUCTIONS) ×2 IMPLANT
TUBE SALEM SUMP 14F W/ARV (TUBING) ×2 IMPLANT
WATER STERILE IRR 1000ML POUR (IV SOLUTION) ×2 IMPLANT

## 2015-09-30 NOTE — Progress Notes (Signed)
09/30/2015 patient came from [place] to [facility] at 1400 after having a tonsillectomy. Patient is alert, oriented and ambulatory. He received MRSA swab and CHG bath when arrive on unit. Patient have no wound, but tattoos on left arm, back of right lower leg and lower back.Bed alarm was turn on when place in bed. Rico Sheehan RN

## 2015-09-30 NOTE — Interval H&P Note (Signed)
History and Physical Interval Note:  09/30/2015 11:20 AM  [name] [name]  has presented today for surgery, with the diagnosis of CHRONIC TONSILLITIS  The various methods of treatment have been discussed with the patient and family. After consideration of risks, benefits and other options for treatment, the patient has consented to  Procedure(s): BILATERAL TONSILLECTOMY (Bilateral) as a surgical intervention .  The patient's history has been reviewed, patient examined, no change in status, stable for surgery.  I have reviewed the patient's chart and labs.  Questions were answered to the patient's satisfaction.     Rima Blizzard

## 2015-09-30 NOTE — Anesthesia Procedure Notes (Signed)
Procedure Name: Intubation Date/Time: 09/30/2015 12:21 PM Performed by: Adalberto Ill Pre-anesthesia Checklist: Patient identified, Emergency Drugs available, Suction available, Patient being monitored and Timeout performed Patient Re-evaluated:Patient Re-evaluated prior to inductionOxygen Delivery Method: Circle system utilized Preoxygenation: Pre-oxygenation with 100% oxygen Intubation Type: IV induction and Rapid sequence Laryngoscope Size: Mac, 3 and Glidescope Grade View: Grade I Tube type: Oral Tube size: 7.5 mm Number of attempts: 1 Placement Confirmation: ETT inserted through vocal cords under direct vision,  positive ETCO2,  CO2 detector and breath sounds checked- equal and bilateral Secured at: 23 cm Tube secured with: Tape Dental Injury: Teeth and Oropharynx as per pre-operative assessment

## 2015-09-30 NOTE — Anesthesia Preprocedure Evaluation (Signed)
Anesthesia Evaluation  Patient identified by MRN, date of birth, ID band Patient awake    Reviewed: Allergy & Precautions, NPO status , Patient's Chart, lab work & pertinent test results  Airway Mallampati: II   Neck ROM: full    Dental   Pulmonary sleep apnea ,    breath sounds clear to auscultation       Cardiovascular hypertension,  Rhythm:regular Rate:Normal     Neuro/Psych  Headaches, PSYCHIATRIC DISORDERS Depression    GI/Hepatic   Endo/Other  obese  Renal/GU      Musculoskeletal   Abdominal   Peds  Hematology   Anesthesia Other Findings   Reproductive/Obstetrics                             Anesthesia Physical Anesthesia Plan  ASA: II  Anesthesia Plan: General   Post-op Pain Management:    Induction: Intravenous  Airway Management Planned: Oral ETT  Additional Equipment:   Intra-op Plan:   Post-operative Plan: Extubation in OR  Informed Consent: I have reviewed the patients History and Physical, chart, labs and discussed the procedure including the risks, benefits and alternatives for the proposed anesthesia with the patient or authorized representative who has indicated his/her understanding and acceptance.     Plan Discussed with: CRNA, Anesthesiologist and Surgeon  Anesthesia Plan Comments:         Anesthesia Quick Evaluation

## 2015-09-30 NOTE — Progress Notes (Signed)
   ENT Progress Note: s/p Procedure(s): BILATERAL TONSILLECTOMY   Subjective: Mild sore throat  Objective: Vital signs in last 24 hours: Temp:  [97.4 F (36.3 C)-97.9 F (36.6 C)] 97.4 F (36.3 C) (04/10 1400) Pulse Rate:  [74-98] 74 (04/10 1400) Resp:  [12-23] 15 (04/10 1400) BP: (128-137)/(74-76) 128/74 mmHg (04/10 1400) SpO2:  [91 %-97 %] 97 % (04/10 1400) Weight:  [112.492 kg (248 lb)] 112.492 kg (248 lb) (04/10 0957) Weight change:  Last BM Date: 09/30/15  Intake/Output from previous day:   Intake/Output this shift: Total I/O In: 1640 [P.O.:240; I.V.:1400] Out: 25 [Blood:25]  Labs: No results for input(s): WBC, HGB, HCT, PLT in the last 72 hours. No results for input(s): NA, K, CL, CO2, GLUCOSE, BUN, CALCIUM in the last 72 hours.  Invalid input(s): CREATININR  Studies/Results: No results found.   PHYSICAL EXAM: Airway stable No bleeding   Assessment/Plan: Stable post op O/N monitoring    [name] [name] 09/30/2015, 5:41 PM

## 2015-09-30 NOTE — H&P (View-Only) (Signed)
  Beckie Salts, MD - 05/29/2015 9:00 AM EST Assessment  Obstructive sleep apnea (327.23) (G47.33). Crohn disease (555.9) (K50.90). Obesity (278.00) (E66.9). Recurrent streptococcal tonsillitis (034.0) (J03.01). Orders  Sleep Study; Sleep Study (PSG); Requested for: 29 May 2015. Discussed  Long-standing recurrent strep tonsillitis. Recommend consideration for tonsillectomy. He has a history of severe sleep apnea and is unable to use CPAP. The last sleep study was about 8 years ago and since then he has been on about 10 or 15 more pounds. I would recommend a full sleep study to evaluate his anesthetic risk and possibly consider additional pharyngeal surgery. Reason For Visit  [name] [name] is here today at the kind request of Cathlean Cower for consultation and opinion. Pt here for evaluation of recurring strep throat and snoring. HPI  History of streptococcal tonsillitis, 1-2 times annually for most of his life. He does recently had 2 more episodes. He has a history of sleep apnea, tried CPAP for several months but was never able to use it successfully. This was about 8 years ago. He has gained about 10 or 15 more pounds since then. He has struggled with his weight his entire life. He also suffers with Crohn's disease and is on [place] for that. Allergies  No Known Drug Allergies. Current Meds  Xanax TABS (ALPRAZolam);; RPT Paxil TABS (PARoxetine HCl);; RPT Remeron TABS (Mirtazapine);; RPT Lisinopril TABS;; RPT Humira Pen KIT;; RPT. Active Problems  Crohn disease (555.9) (K50.90) Hypertension (401.9) (I10) Obstructive sleep apnea (327.23) (G47.33). [place]  Foot Surgery Gastric Surgery Oral Surgery. Family Hx  Family history of cardiac disorder (V17.49) (Z82.49) Family history of cerebrovascular accident (CVA) (V17.1) (Z82.3) Family history of hypertension (V17.49) (Z82.49) No pertinent family history: Mother. Personal Hx  Caffeine use (V49.89) (F15.90) Cigar smoker (305.1)  (F17.290) Daily alcohol use. Light tobacco smoker (305.1) (F17.200). ROS  12 system ROS was obtained and reviewed on the Health Maintenance form dated today. Positive responses are shown above. If the symptom is not checked, the patient has denied it. Vital Signs  Adult Vitals Recorded by Sallyanne Kuster on December 07,2016 09:16 AM BP: 125/80 mm Hg  Height: 5 ft 9 in, Weight: 246 lb , BMI: 36.33 , BSA: 2.26  . Physical Exam  APPEARANCE: Well developed, obese gentleman, in no acute distress. Normal affect, in a pleasant mood. Oriented to time, place and person. COMMUNICATION: Normal voice  HEAD & FACE: No scars, lesions or masses of head and face. Sinuses nontender to palpation. Salivary glands without mass or tenderness. Facial strength symmetric. No facial lesion, scars, or mass. EYES: EOMI with normal primary gaze alignment. Visual acuity grossly intact. PERRLA EXTERNAL EAR & NOSE: No scars, lesions or masses  EAC & TYMPANIC MEMBRANE: EAC shows no obstructing lesions or debris and tympanic membranes are normal bilaterally with good movement to insufflation. GROSS HEARING: Normal  TMJ: Nontender  INTRANASAL EXAM: No polyps or purulence.  NASOPHARYNX: Normal, without lesions. LIPS, TEETH & GUMS: No lip lesions, normal dentition and normal gums. ORAL CAVITY/OROPHARYNX: Oral mucosa moist without lesion or asymmetry of the palate, tongue, tonsil or posterior pharynx. Tonsils are 3+. There is a lot of redundancy of the pharyngeal mucosa with restricted airways. NECK: Supple without adenopathy or mass. THYROID: Normal with no masses palpable.  NEUROLOGIC: No gross CN deficits. No nystagmus noted.  LYMPHATIC: No enlarged nodes palpable.

## 2015-09-30 NOTE — Transfer of Care (Signed)
Immediate Anesthesia Transfer of Care Note  Patient: [name] [name]  Procedure(s) Performed: Procedure(s): BILATERAL TONSILLECTOMY (Bilateral)  Patient Location: PACU  Anesthesia Type:General  Level of Consciousness: awake, alert , oriented and patient cooperative  Airway & Oxygen Therapy: Patient Spontanous Breathing and Patient connected to nasal cannula oxygen  Post-op Assessment: Report given to RN, Post -op Vital signs reviewed and stable and Patient moving all extremities X 4  Post vital signs: Reviewed and stable  Last Vitals:  Filed Vitals:   09/30/15 0957  BP: 130/76  Temp: 36.6 C  Resp: 18    Complications: No apparent anesthesia complications

## 2015-09-30 NOTE — Op Note (Signed)
09/30/2015  12:46 PM  PATIENT:  [name] [name]  40 y.o. male  PRE-OPERATIVE DIAGNOSIS:  CHRONIC TONSILLITIS  POST-OPERATIVE DIAGNOSIS:  CHRONIC TONSILLITIS  PROCEDURE:  Procedure(s): BILATERAL TONSILLECTOMY  SURGEON:  Surgeon(s): Izora Gala, MD  ANESTHESIA:   General  COUNTS: Correct   DICTATION: The patient was taken to the operating room and placed on the operating table in the supine position. Following induction of general endotracheal anesthesia, the table was turned and the patient was draped in a standard fashion. A Crowe-Davis mouthgag was inserted into the oral cavity and used to retract the tongue and mandible, then attached to the Mayo stand.  The tonsillectomy was then performed using electrocautery dissection, carefully dissecting the avascular plane between the capsule and constrictor muscles. Cautery was used for completion of hemostasis. The tonsils were severely enlarged, and opbstructing , and were discarded.  The pharynx was irrigated with saline and suctioned. An oral gastric tube was used to aspirate the contents of the stomach. The patient was then awakened from anesthesia and transferred to PACU in stable condition.   PATIENT DISPOSITION:  To PACA, stable

## 2015-10-01 ENCOUNTER — Encounter (HOSPITAL_COMMUNITY): Payer: Self-pay | Admitting: Otolaryngology

## 2015-10-01 DIAGNOSIS — J3501 Chronic tonsillitis: Secondary | ICD-10-CM | POA: Diagnosis not present

## 2015-10-01 NOTE — Anesthesia Postprocedure Evaluation (Signed)
Anesthesia Post Note  Patient: [name] [name]  Procedure(s) Performed: Procedure(s) (LRB): BILATERAL TONSILLECTOMY (Bilateral)  Patient location during evaluation: PACU Anesthesia Type: General Level of consciousness: awake and alert and patient cooperative Pain management: pain level controlled Vital Signs Assessment: post-procedure vital signs reviewed and stable Respiratory status: spontaneous breathing and respiratory function stable Cardiovascular status: stable Anesthetic complications: no    Last Vitals:  Filed Vitals:   10/01/15 0748 10/01/15 0938  BP: 100/53 111/68  Pulse: 75   Temp: 36.4 C   Resp: 15     Last Pain:  Filed Vitals:   10/01/15 0939  PainSc: [place]

## 2015-10-01 NOTE — Discharge Summary (Signed)
Physician Discharge Summary  Patient ID: [name] [name] MRN: [id number] DOB/AGE: 1975-11-17 40 y.o.  Admit date: 09/30/2015 Discharge date: 10/01/2015  Admission Diagnoses:Sleep apnea, chronic tonsillitis  Discharge Diagnoses:  Active Problems:   Obstructive sleep apnea   Discharged Condition: good  Hospital Course: no complications, taking po well. No bleeding or breathing difficulty.  Consults: none  Significant Diagnostic Studies: none  Treatments: surgery: tonsillectomy  Discharge Exam: Blood pressure 100/53, pulse 75, temperature 97.5 F (36.4 C), temperature source Oral, resp. rate 15, height 5' 9"  (1.753 m), weight 112.492 kg (248 lb), SpO2 96 %. PHYSICAL EXAM: Awake and alert, breathing clearly, voice normal, seems comfortable. No bleeding.  Disposition: 01-Home or Self Care  Discharge Instructions    Diet - low sodium heart healthy    Complete by:  As directed      Increase activity slowly    Complete by:  As directed             Medication List    TAKE these medications        acetaminophen 325 MG tablet  Commonly known as:  TYLENOL  Take 650 mg by mouth every 6 (six) hours as needed for moderate pain.     acidophilus Caps capsule  Take 1 capsule by mouth daily.     Adalimumab 40 MG/0.8ML Pnkt  Inject 40 mg into the skin every 14 (fourteen) days.     ALPRAZOLAM XR 3 MG 24 hr tablet  Generic drug:  ALPRAZolam  TAKE 1 TABLET BY MOUTH EVERY MORNING     alprazolam 2 MG tablet  Commonly known as:  XANAX  TAKE 1/2 TABLET BY MOUTH TWICE DAILY AS NEEDED     azithromycin 250 MG tablet  Commonly known as:  ZITHROMAX Z-PAK  As directed     colestipol 5 g granules  Commonly known as:  COLESTID  Take 5 g by mouth daily with supper.     diphenoxylate-atropine 2.5-0.025 MG tablet  Commonly known as:  LOMOTIL  Take 1 tablet by mouth 4 (four) times daily as needed for diarrhea or loose stools.     emtricitabine-tenofovir 200-300 MG tablet   Commonly known as:  TRUVADA  Take 1 tablet by mouth daily.     HYDROcodone-acetaminophen 7.5-325 mg/15 ml solution  Commonly known as:  HYCET  Take 15 mLs by mouth 4 (four) times daily as needed for moderate pain.     lisinopril-hydrochlorothiazide 20-25 MG tablet  Commonly known as:  PRINZIDE,ZESTORETIC  TAKE 1 TABLET BY MOUTH DAILY     mirtazapine 30 MG tablet  Commonly known as:  REMERON  Take 1 tablet (30 mg total) by mouth at bedtime.     ondansetron 4 MG disintegrating tablet  Commonly known as:  ZOFRAN ODT  Take 1 tablet (4 mg total) by mouth every 8 (eight) hours as needed for nausea or vomiting.     ondansetron 4 MG tablet  Commonly known as:  ZOFRAN  TAKE 1 TABLET BY MOUTH EVERY 8 HOURS AS NEEDED FOR NAUSEA OR VOMITING.     PARoxetine 20 MG tablet  Commonly known as:  PAXIL  TAKE 2 TABLETS BY MOUTH DAILY     promethazine-codeine 6.25-10 MG/5ML syrup  Commonly known as:  PHENERGAN with CODEINE  Take 5 mLs by mouth every 4 (four) hours as needed.           Follow-up Information    Follow up with Cathlean Cower, MD.   Specialties:  Internal Medicine, Radiology  Contact information:   [place] [address] [phone]       Follow up with [facility].   Specialty:  Emergency Medicine   Contact information:   [address] [id number] [place]      Follow up with Izora Gala, MD. Go in 2 weeks.   Specialty:  Otolaryngology   Why:  Follow up with Dr. Constance Holster in two weeks.  Appointment made for 10/14/2015 at 3:50 p.m. with Dr. Constance Holster ([phone] instead of [phone])   Contact information:   [address] [place] [address] [phone]       Signed: Izora Gala 10/01/2015, 8:50 AM

## 2015-10-01 NOTE — Discharge Instructions (Signed)
Tonsillectomy, Adult, Care After Refer to this sheet in the next few weeks. These instructions provide you with information on caring for yourself after your procedure. Your health care provider may also give you more specific instructions. Your treatment has been planned according to current medical practices, but problems sometimes occur. Call your health care provider if you have any problems or questions after your procedure. WHAT TO EXPECT AFTER THE PROCEDURE After your procedure, it is typical to have the following:  Your tongue will be numb, and your sense of taste will be reduced.  Your swallowing will be difficult and painful.  Your jaw may hurt or make a clicking noise when you yawn or chew.  Liquids that you drink may leak out of your nose.  Your voice may sound muffled.  The area at the middle of the roof of your mouth (uvula) may be very swollen.  You may have a constant cough and need to clear mucus and phlegm from your throat. HOME CARE INSTRUCTIONS   Get proper rest, keeping your head elevated at all times.  Drink plenty of fluids. This reduces pain and hastens the healing process.  Take medicines only as directed by your health care provider.  Soft and cold foods, such as gelatin, sherbet, ice cream, frozen ice pops, and cold drinks, are usually the easiest to eat. Several days after surgery, you will be able to eat more solid food.  Avoid mouthwashes and gargles.  Avoid contact with people who have upper respiratory infections, such as colds and sore throats. SEEK MEDICAL CARE IF:   You have increasing pain that is not controlled with medicines.  You have a fever.  You have a rash.  You feel light-headed or faint.  You are unable to swallow even small amounts of liquid or saliva.  Your urine is becoming very dark. SEEK IMMEDIATE MEDICAL CARE IF:   You have difficulty breathing.  You experience side effects or allergic reactions to medicines.  You  bleed bright red blood from your throat, or you vomit bright red blood.   This information is not intended to replace advice given to you by your health care provider. Make sure you discuss any questions you have with your health care provider.   Document Released: 04/08/2004 Document Revised: 10/23/2014 Document Reviewed: 01/03/2013 Elsevier Interactive Patient Education [employer].

## 2015-10-07 ENCOUNTER — Encounter: Payer: Self-pay | Admitting: Internal Medicine

## 2015-10-14 ENCOUNTER — Other Ambulatory Visit: Payer: Self-pay | Admitting: Internal Medicine

## 2015-10-14 DIAGNOSIS — J3501 Chronic tonsillitis: Secondary | ICD-10-CM | POA: Insufficient documentation

## 2015-10-23 ENCOUNTER — Other Ambulatory Visit: Payer: Self-pay | Admitting: Internal Medicine

## 2015-10-24 ENCOUNTER — Encounter: Payer: Self-pay | Admitting: Internal Medicine

## 2015-10-24 ENCOUNTER — Telehealth: Payer: Self-pay | Admitting: Internal Medicine

## 2015-10-24 MED ORDER — PAROXETINE HCL 20 MG PO TABS: 40.0000 mg | ORAL_TABLET | Freq: Every day | ORAL | Status: DC

## 2015-10-24 MED ORDER — LISINOPRIL-HYDROCHLOROTHIAZIDE 20-25 MG PO TABS: 1.0000 | ORAL_TABLET | Freq: Every day | ORAL | Status: DC

## 2015-10-24 MED ORDER — ADALIMUMAB 40 MG/0.8ML ~~LOC~~ AJKT: 40.0000 mg | AUTO-INJECTOR | SUBCUTANEOUS | Status: DC

## 2015-10-24 NOTE — Telephone Encounter (Signed)
Refilled humira as requested, ok per Barb Merino, [facility]

## 2015-10-24 NOTE — Telephone Encounter (Signed)
Medication faxed to pharmacy

## 2015-10-24 NOTE — Telephone Encounter (Signed)
Done erx 

## 2015-10-24 NOTE — Telephone Encounter (Signed)
Done hardcopy to Corinne  

## 2015-10-24 NOTE — Telephone Encounter (Signed)
Forwarded to Patti Martinique

## 2015-10-24 NOTE — Telephone Encounter (Signed)
Please advise 

## 2015-10-24 NOTE — Addendum Note (Signed)
Addended by: [place], Zan Orlick E on: 10/24/2015 05:37 PM   Modules accepted: Orders

## 2015-10-30 ENCOUNTER — Ambulatory Visit: Payer: BLUE CROSS/BLUE SHIELD | Admitting: Internal Medicine

## 2015-10-30 ENCOUNTER — Encounter: Payer: Self-pay | Admitting: Internal Medicine

## 2015-10-30 DIAGNOSIS — Z0289 Encounter for other administrative examinations: Secondary | ICD-10-CM

## 2015-11-27 ENCOUNTER — Other Ambulatory Visit: Payer: Self-pay | Admitting: Internal Medicine

## 2015-11-28 NOTE — Telephone Encounter (Signed)
Please advise in dr Gwynn Burly absence, thanks

## 2015-11-29 NOTE — Telephone Encounter (Signed)
Faxed script back to [facility]...[name] [name]

## 2016-01-15 ENCOUNTER — Other Ambulatory Visit: Payer: Self-pay | Admitting: Internal Medicine

## 2016-02-28 ENCOUNTER — Other Ambulatory Visit: Payer: Self-pay | Admitting: Family

## 2016-02-28 MED ORDER — ALPRAZOLAM 2 MG PO TABS: ORAL_TABLET | ORAL | 1 refills | Status: DC

## 2016-02-28 NOTE — Telephone Encounter (Signed)
Done hardcopy to Corinne  Please contact pt - due for yearly visit please

## 2016-02-28 NOTE — Telephone Encounter (Signed)
faxed

## 2016-03-02 NOTE — Telephone Encounter (Signed)
Rec'd fax for refill on Alprazolam. Per chart MD ok on Friday 9/8 which was faxed by his assistant to [place] left info on pharmacy vm concerning refill on alprazolam.../lmb

## 2016-03-03 ENCOUNTER — Telehealth: Payer: Self-pay | Admitting: *Deleted

## 2016-03-03 MED ORDER — ALPRAZOLAM ER 3 MG PO TB24: 3.0000 mg | ORAL_TABLET | Freq: Every morning | ORAL | 3 refills | Status: DC

## 2016-03-03 NOTE — Addendum Note (Signed)
Addended by: Biagio Borg on: 03/03/2016 07:09 PM   Modules accepted: Orders

## 2016-03-03 NOTE — Telephone Encounter (Signed)
Rec'd call pt is requesting refill on alprazolam 3 mg...[name] [name]

## 2016-03-03 NOTE — Telephone Encounter (Signed)
Done hardcopy to Corinne  

## 2016-03-04 ENCOUNTER — Telehealth: Payer: Self-pay | Admitting: *Deleted

## 2016-03-04 NOTE — Telephone Encounter (Signed)
faxed

## 2016-03-04 NOTE — Telephone Encounter (Signed)
Pharmacist Claudette Head left msg on triage stating the Alprazolam XR 7m that was fax yesterday they do not have in stock, but we [facility] and [facility] the medication. Since med is control substance we are not allowed to transfer requesting rx to be called [facility]. [facility] spoke w/Natalie gave md authorization on alprazolam 3 mg.../Johny Chess

## 2016-03-04 NOTE — Telephone Encounter (Signed)
MD approved already see mychart msg from 9/12,,,,/lmb

## 2016-03-26 ENCOUNTER — Ambulatory Visit (INDEPENDENT_AMBULATORY_CARE_PROVIDER_SITE_OTHER): Payer: BLUE CROSS/BLUE SHIELD | Admitting: Internal Medicine

## 2016-03-26 ENCOUNTER — Telehealth: Payer: Self-pay | Admitting: Internal Medicine

## 2016-03-26 VITALS — BP 130/70 | HR 70 | Temp 98.0°F | Resp 20 | Wt 252.0 lb

## 2016-03-26 DIAGNOSIS — G4700 Insomnia, unspecified: Secondary | ICD-10-CM | POA: Diagnosis not present

## 2016-03-26 DIAGNOSIS — L739 Follicular disorder, unspecified: Secondary | ICD-10-CM | POA: Insufficient documentation

## 2016-03-26 DIAGNOSIS — F418 Other specified anxiety disorders: Secondary | ICD-10-CM

## 2016-03-26 MED ORDER — DOXYCYCLINE HYCLATE 100 MG PO TABS: 100.0000 mg | ORAL_TABLET | Freq: Two times a day (BID) | ORAL | 0 refills | Status: DC

## 2016-03-26 MED ORDER — DULOXETINE HCL 60 MG PO CPEP: 60.0000 mg | ORAL_CAPSULE | Freq: Every day | ORAL | 11 refills | Status: DC

## 2016-03-26 MED ORDER — ZOLPIDEM TARTRATE 10 MG PO TABS: 10.0000 mg | ORAL_TABLET | Freq: Every evening | ORAL | 5 refills | Status: DC | PRN

## 2016-03-26 MED ORDER — DULOXETINE HCL 30 MG PO CPEP: 30.0000 mg | ORAL_CAPSULE | Freq: Every day | ORAL | 0 refills | Status: DC

## 2016-03-26 NOTE — Telephone Encounter (Signed)
PLEASE NOTE: All timestamps contained within this report are represented as [place] Standard Time. CONFIDENTIALTY NOTICE: This fax transmission is intended only for the addressee. It contains information that is legally privileged, confidential or otherwise protected from use or disclosure. If you are not the intended recipient, you are strictly prohibited from reviewing, disclosing, copying using or disseminating any of this information or taking any action in reliance on or regarding this information. If you have received this fax in error, please notify us immediately by telephone so that we can arrange for its return to [place]. Phone: [phone], Toll-Free: [phone], Fax: [phone] Page: 1 of 1 Call Id: [id number] [facility] - Client [place] Patient Name: [name] [name] DOB: Jul 03, 1975 Initial Comment Caller states he has had Shingles twice. Right side he has some lesions. Concerned he may have Shingles again. Nurse Assessment Nurse: Dimas Chyle, RN, Dellis Filbert Date/Time Eilene Ghazi Time): 03/26/2016 2:43:26 PM Confirm and document reason for call. If symptomatic, describe symptoms. You must click the next button to save text entered. ---Caller states he has had Shingles twice. Right side he has some lesions. Concerned he may have Shingles again. Has the patient traveled out of the country within the last 30 days? ---No Does the patient have any new or worsening symptoms? ---Yes Will a triage be completed? ---Yes Related visit to physician within the last 2 weeks? ---No Does the PT have any chronic conditions? (i.e. diabetes, asthma, etc.) ---Yes List chronic conditions. ---HTN, Crohn's disease Is this a behavioral health or substance abuse call? ---No Guidelines Guideline Title Affirmed Question Affirmed Notes Shingles [1] Shingles rash AND [2] onset > 72 hours ago Final Disposition User See Physician within 24 Hours  Silvana, RN, [employer] Referrals REFERRED TO PCP OFFICE Disagree/Comply: Leta Baptist

## 2016-03-26 NOTE — Assessment & Plan Note (Signed)
[place] for wean off paxil, and start cymbalta asd,  to f/u any worsening symptoms or concerns

## 2016-03-26 NOTE — Progress Notes (Signed)
Letter done

## 2016-03-26 NOTE — Patient Instructions (Addendum)
You had the flu shot today  Please take all new medication as prescribed- the antibiotic, as well as the cymbalta and the ambien  OK to take HALF your current paxil (which would be 20 mg) for 2 wks, then Half of that (whcih would be 10 mg) for 2 wks, then stop  OK to stop the remeron now  OK to start the cymbalta after the first 2 wks of cutting down on the paxil  Please continue all other medications as before, and refills have been done if requested.  Please have the pharmacy call with any other refills you may need.  Please continue your efforts at being more active, low cholesterol diet, and weight control.  You are otherwise up to date with prevention measures today.  Please keep your appointments with your specialists as you may have planned

## 2016-03-26 NOTE — Assessment & Plan Note (Signed)
Ok for change remeron to [facility] qhs prn,  to f/u any worsening symptoms or concerns

## 2016-03-26 NOTE — Progress Notes (Signed)
Subjective:    Patient ID: [name] [name], male    DOB: Apr 28, 1976, 40 y.o.   MRN: [id number]  HPI  Here with 3 days mod painful persistent bumps/rash to RLQ abd with redness/tender/mild swelling area, has felt warm but not sure about fever, but without chills.  Pain about 5/10, can barely touch in the shower.  Wondering about shingles.  Also has been cont'd to see counselor, and realizes that he has worsening anxiety and depression despite good compliance on meds he has been on > 5 yrs.  Would like to try change, denies SI or HI.  Denies suicidal ideation, or panic. Pt denies chest pain, increased sob or doe, wheezing, orthopnea, PND, increased LE swelling, palpitations, dizziness or syncope.  Pt denies polydipsia, polyuria  Will need different med for sleep if remeron is changed Past Medical History:  Diagnosis Date  . Anal fissure - posterior 02/15/2014  . Anxiety   . Chronic streptococcal tonsillitis    1-2 times annually for most of his life Archie Endo 09/26/2015  . CMV (cytomegalovirus infection) ([place]) 10/28/2010  . Crohn's disease ([place])   . Depression   . Essential hypertension, benign   . Fistula, perirectal   . Headache    history of but no issues currently  . Herpes zoster infection 07/2010, 12/2010   with post-herpetic neuralgia  . Hyperlipidemia   . Nephrolithiasis   . Obesity, unspecified   . Obstructive sleep apnea (adult) (pediatric)   . Plantar fasciitis of right foot 08/22/2013  . Pyelonephritis 2015  . Sinus infection 08/05/11  . Syncope   . Tinea pedis 06/27/2013  . Vitamin D deficiency    Past Surgical History:  Procedure Laterality Date  . COLON RESECTION    . COLON SURGERY    . COLONOSCOPY  10/14/2007   crohn's colitis, aphthous ulcers, mild anal stenosis  . GANGLION CYST EXCISION Right 2016  . HYPOSPADIAS CORRECTION     Childhood   . SMALL INTESTINE SURGERY  08/05/07   1 ft removed, ileo-cecectomy  . TONSILLECTOMY Bilateral 09/30/2015  . TONSILLECTOMY Bilateral  09/30/2015   Procedure: BILATERAL TONSILLECTOMY;  Surgeon: Izora Gala, MD;  Location: [place];  Service: ENT;  Laterality: Bilateral;  . TOOTH EXTRACTION    . UPPER GASTROINTESTINAL ENDOSCOPY  10/15/2010   w/biopsy, mild gastritis and duodenitis    reports that he has never smoked. He has never used smokeless tobacco. He reports that he drinks about 1.8 oz of alcohol per week . He reports that he does not use drugs. family history includes Allergies in his mother and sister; Asthma in his sister; Cancer in his mother; Gout in his father; Heart disease in his father; Hyperlipidemia in his father; Hypertension in his father; Obesity in his mother; Sleep apnea in his father; Stroke in his maternal grandfather. No Known Allergies Current Outpatient Prescriptions on File Prior to Visit  Medication Sig Dispense Refill  . acetaminophen (TYLENOL) 325 MG tablet Take 650 mg by mouth every 6 (six) hours as needed for moderate pain.    Marland Kitchen acidophilus (RISAQUAD) CAPS capsule Take 1 capsule by mouth daily.    Marland Kitchen ALPRAZolam (ALPRAZOLAM XR) 3 MG 24 hr tablet Take 1 tablet (3 mg total) by mouth every morning. 30 tablet 3  . alprazolam (XANAX) 2 MG tablet 1/2 tab by mouth twice per day as needed 30 tablet 1  . colestipol (COLESTID) 5 g granules Take 5 g by mouth daily with supper. 500 g 12  . diphenoxylate-atropine (  LOMOTIL) 2.5-0.025 MG tablet Take 1 tablet by mouth 4 (four) times daily as needed for diarrhea or loose stools. 90 tablet 0  . emtricitabine-tenofovir (TRUVADA) 200-300 MG per tablet Take 1 tablet by mouth daily. 90 tablet 3  . HUMIRA PEN 40 MG/0.8ML PNKT INJECT ONE PEN (40MG) UNDER THE SKIN EVERY 14 DAYS.  REFRIGERATE. 6 each 1  . HYDROcodone-acetaminophen (HYCET) 7.5-325 mg/15 ml solution Take 15 mLs by mouth 4 (four) times daily as needed for moderate pain. 480 mL 0  . lisinopril-hydrochlorothiazide (PRINZIDE,ZESTORETIC) 20-25 MG tablet Take 1 tablet by mouth daily. 90 tablet 1  . ondansetron (ZOFRAN  ODT) 4 MG disintegrating tablet Take 1 tablet (4 mg total) by mouth every 8 (eight) hours as needed for nausea or vomiting. 20 tablet 0  . ondansetron (ZOFRAN) 4 MG tablet TAKE 1 TABLET BY MOUTH EVERY 8 HOURS AS NEEDED FOR NAUSEA OR VOMITING. 30 tablet 1  . promethazine-codeine (PHENERGAN WITH CODEINE) 6.25-10 MG/5ML syrup Take 5 mLs by mouth every 4 (four) hours as needed. 300 mL 0   No current facility-administered medications on file prior to visit.    Review of Systems  Constitutional: Negative for unusual diaphoresis or night sweats HENT: Negative for ear swelling or discharge Eyes: Negative for worsening visual haziness  Respiratory: Negative for choking and stridor.   Gastrointestinal: Negative for distension or worsening eructation Genitourinary: Negative for retention or change in urine volume.  Musculoskeletal: Negative for other MSK pain or swelling Skin: Negative for color change and worsening wound Neurological: Negative for tremors and numbness other than noted  Psychiatric/Behavioral: Negative for decreased concentration or agitation other than above       Objective:   Physical Exam BP 130/70   Pulse 70   Temp 98 F (36.7 C) (Oral)   Resp 20   Wt 252 lb (114.3 kg)   SpO2 98%   BMI 37.21 kg/m  VS noted, mild ill, obese Constitutional: Pt appears in no apparent distress HENT: Head: NCAT.  Right Ear: External ear normal.  Left Ear: External ear normal.  Eyes: . Pupils are equal, round, and reactive to light. Conjunctivae and EOM are normal Neck: Normal range of motion. Neck supple.  Cardiovascular: Normal rate and regular rhythm.   Pulmonary/Chest: Effort normal and breath sounds without rales or wheezing.  Abd:  Soft,  ND, + BS with RLQ with numerous pustules tender on erythema base approx 4x4 cm area Neurological: Pt is alert. Not confused , motor grossly intact Skin: Skin is warm. No rash, no LE edema Psychiatric: Pt behavior is normal. No agitation. +  dysphoric    Assessment & Plan:

## 2016-03-26 NOTE — Progress Notes (Signed)
Pre visit review using our clinic review tool, if applicable. No additional management support is needed unless otherwise documented below in the visit note. 

## 2016-03-26 NOTE — Assessment & Plan Note (Signed)
Mild to mod, for antibx course,  to f/u any worsening symptoms or concerns 

## 2016-03-27 ENCOUNTER — Encounter: Payer: Self-pay | Admitting: Internal Medicine

## 2016-03-27 MED ORDER — TRAMADOL HCL 50 MG PO TABS: 50.0000 mg | ORAL_TABLET | Freq: Three times a day (TID) | ORAL | 0 refills | Status: DC | PRN

## 2016-03-27 NOTE — Telephone Encounter (Signed)
Done hardcopy to Corinne  

## 2016-03-29 ENCOUNTER — Other Ambulatory Visit: Payer: Self-pay | Admitting: Internal Medicine

## 2016-03-30 NOTE — Telephone Encounter (Signed)
Patient is calling again about his rx. That the pharmacy did not get it. Please follow up, Thank you.

## 2016-03-31 ENCOUNTER — Telehealth: Payer: Self-pay | Admitting: *Deleted

## 2016-03-31 MED ORDER — TRAMADOL HCL 50 MG PO TABS: 50.0000 mg | ORAL_TABLET | Freq: Three times a day (TID) | ORAL | 2 refills | Status: DC | PRN

## 2016-03-31 NOTE — Telephone Encounter (Signed)
Not sure why this is so much of a problem since documentation shows was just done oct 6  Done hardcopy to [facility]

## 2016-03-31 NOTE — Telephone Encounter (Signed)
Rec'd fax pt requesting refill on alprazolam 3 mg.../LMB

## 2016-03-31 NOTE — Telephone Encounter (Signed)
unfort we cannot do this, as he received a rx about mid September for 1 mo and 3 refills

## 2016-04-01 NOTE — Telephone Encounter (Signed)
Called [facility] spoke w/Jennifer verified if rx was received for alprazolam on 9/12. Per Anderson Malta they never received. Gave verbal authorization from 9/12...Johny Chess

## 2016-04-02 ENCOUNTER — Telehealth: Payer: Self-pay | Admitting: Internal Medicine

## 2016-04-02 ENCOUNTER — Telehealth: Payer: Self-pay | Admitting: Emergency Medicine

## 2016-04-02 NOTE — Telephone Encounter (Signed)
yes

## 2016-04-02 NOTE — Telephone Encounter (Signed)
Patient notified

## 2016-04-02 NOTE — Telephone Encounter (Signed)
[facility] called and wants to verify that patient is taking extended release and regular release Xanax. Please advise thanks.

## 2016-04-02 NOTE — Telephone Encounter (Signed)
Patient is due for Humira today.  He is on doxycycline for folliculitis.  Ok to take [place] today?

## 2016-04-16 ENCOUNTER — Other Ambulatory Visit: Payer: Self-pay | Admitting: Internal Medicine

## 2016-04-27 ENCOUNTER — Other Ambulatory Visit: Payer: Self-pay | Admitting: Internal Medicine

## 2016-04-28 NOTE — Telephone Encounter (Signed)
faxed

## 2016-04-28 NOTE — Telephone Encounter (Signed)
Done hardcopy to Corinne  

## 2016-05-19 ENCOUNTER — Telehealth: Payer: Self-pay

## 2016-05-19 NOTE — Telephone Encounter (Signed)
Faxed request back to pharmacy advising them to send to PCP.

## 2016-05-19 NOTE — Telephone Encounter (Signed)
I may have rexed in past but see if PCP can do this one please

## 2016-05-19 NOTE — Telephone Encounter (Signed)
Pharmacy requesting refill on Lisinopril -HCTZ, please advise Sir?

## 2016-05-29 ENCOUNTER — Other Ambulatory Visit: Payer: Self-pay

## 2016-05-29 MED ORDER — ALPRAZOLAM ER 3 MG PO TB24: 3.0000 mg | ORAL_TABLET | Freq: Every morning | ORAL | 3 refills | Status: DC

## 2016-05-29 NOTE — Progress Notes (Signed)
Done hardcopy to Corinne  

## 2016-06-24 ENCOUNTER — Other Ambulatory Visit: Payer: Self-pay | Admitting: Internal Medicine

## 2016-06-24 DIAGNOSIS — K5000 Crohn's disease of small intestine without complications: Secondary | ICD-10-CM

## 2016-06-25 ENCOUNTER — Other Ambulatory Visit: Payer: Self-pay | Admitting: Internal Medicine

## 2016-06-25 MED ORDER — DIPHENOXYLATE-ATROPINE 2.5-0.025 MG PO TABS: 1.0000 | ORAL_TABLET | Freq: Four times a day (QID) | ORAL | 1 refills | Status: DC | PRN

## 2016-06-25 NOTE — Telephone Encounter (Signed)
faxed

## 2016-06-25 NOTE — Telephone Encounter (Signed)
Done hardcopy to Corinne  

## 2016-06-25 NOTE — Telephone Encounter (Signed)
Spoke with [name] and made him an appt for early next week which was first available.  He will get his quantiferon test drawn then, orders in. Refilled his lomotil. [facility] [employer]

## 2016-06-25 NOTE — Telephone Encounter (Signed)
-----   Message from Gatha Mayer, MD sent at 06/25/2016 11:00 AM EST ----- Regarding: refill and quantiferon Ok to refill Lomotil x 2 He needs a quantiferon test Needs appt next avail also

## 2016-06-30 ENCOUNTER — Telehealth: Payer: Self-pay | Admitting: Internal Medicine

## 2016-06-30 ENCOUNTER — Encounter: Payer: Self-pay | Admitting: Internal Medicine

## 2016-06-30 ENCOUNTER — Ambulatory Visit (INDEPENDENT_AMBULATORY_CARE_PROVIDER_SITE_OTHER): Payer: BLUE CROSS/BLUE SHIELD | Admitting: Internal Medicine

## 2016-06-30 ENCOUNTER — Other Ambulatory Visit: Payer: BLUE CROSS/BLUE SHIELD

## 2016-06-30 VITALS — BP 110/72 | HR 80 | Ht 68.0 in | Wt 235.6 lb

## 2016-06-30 DIAGNOSIS — Z7251 High risk heterosexual behavior: Secondary | ICD-10-CM | POA: Diagnosis not present

## 2016-06-30 DIAGNOSIS — K5000 Crohn's disease of small intestine without complications: Secondary | ICD-10-CM

## 2016-06-30 DIAGNOSIS — K50818 Crohn's disease of both small and large intestine with other complication: Secondary | ICD-10-CM | POA: Diagnosis not present

## 2016-06-30 DIAGNOSIS — D899 Disorder involving the immune mechanism, unspecified: Secondary | ICD-10-CM

## 2016-06-30 DIAGNOSIS — D849 Immunodeficiency, unspecified: Secondary | ICD-10-CM

## 2016-06-30 MED ORDER — COLESTIPOL HCL 5 G PO GRAN: 5.0000 g | GRANULES | Freq: Every day | ORAL | 12 refills | Status: DC

## 2016-06-30 MED ORDER — COLESTIPOL HCL 1 G PO TABS: 5.0000 g | ORAL_TABLET | Freq: Every day | ORAL | 11 refills | Status: DC

## 2016-06-30 NOTE — Patient Instructions (Addendum)
   Your physician has requested that you go to the basement for the lab work before leaving today.    We have sent the following medications to your pharmacy for you to pick up at your convenience: Colestipol, let us know if that helps   Please follow up with Dr Carlean Purl in 6 months.   I appreciate the opportunity to care for you. Silvano Rusk, MD, [facility]

## 2016-06-30 NOTE — Progress Notes (Signed)
   [name] [name] 41 y.o. 05-11-1976 [id number]  Assessment & Plan:  CROHN'S DISEASE, LARGE AND SMALL INTESTINES ? Worse, IBS or bile salt malabsorp Go back on Colestipol Stay on Humira  Immunosuppression Check quantiferon  High risk sexual behavior Check HIV  RTC 6 mos sooner prn    Subjective:   Chief Complaint: diarrhea, f/u Crohn's  HPI [name] [name] reports that he is having diarrhea problems again - some intermittent loose stools but not always loose, can be formed. Says he thinks Humira not as good as Remicade - which was dced due to perceived lack of effectiveness and Abs + Has stopped taking Colestipol - cannot give me a reason Loose stools typically post-prandial - Lomotil qAC helps - recently refilled Got Tx for folliculitis in Fall - transient partial benefit for rash at beltline  Medications, allergies, past medical history, past surgical history, family history and social history are reviewed and updated in the EMR.  Review of Systems As above - requesting HIV check today  Objective:   Physical Exam @BP  110/72   Pulse 80   Ht 5' 8"  (1.727 m)   Wt 235 lb 9.6 oz (106.9 kg)   BMI 35.82 kg/m @  General:  Well-developed, well-nourished and in no acute distress - obese Eyes:  anicteric. Lungs: Clear to auscultation bilaterally. Heart:  S1S2, no rubs, murmurs, gallops. Abdomen:  soft, non-tender, no hepatosplenomegaly, hernia, or mass and BS+.  Skin   folliculitis at waistband Neuro:  A&O x 3.  Psych:  appropriate mood and  Affect.   Data Reviewed: PCP notes 2017 Labs 2017

## 2016-06-30 NOTE — Assessment & Plan Note (Signed)
Check HIV

## 2016-06-30 NOTE — Assessment & Plan Note (Signed)
Check quantiferon

## 2016-06-30 NOTE — Assessment & Plan Note (Addendum)
?   Worse, IBS or bile salt malabsorp Go back on Colestipol Stay on Humira

## 2016-06-30 NOTE — Telephone Encounter (Signed)
Called and talked to [place] and told him we changed his rx to tablet form.

## 2016-07-01 LAB — HIV ANTIBODY (ROUTINE TESTING W REFLEX): HIV 1&2 Ab, 4th Generation: NONREACTIVE

## 2016-07-01 NOTE — Progress Notes (Signed)
HIV negative Other results to follow

## 2016-07-02 LAB — QUANTIFERON TB GOLD ASSAY (BLOOD)
INTERFERON GAMMA RELEASE ASSAY: NEGATIVE
Mitogen-Nil: >10 IU/mL
QUANTIFERON TB AG MINUS NIL: 0.04 [IU]/mL
Quantiferon Nil Value: 0.08 IU/mL

## 2016-07-02 NOTE — Progress Notes (Signed)
TB test negative also

## 2016-07-31 ENCOUNTER — Other Ambulatory Visit: Payer: Self-pay | Admitting: Internal Medicine

## 2016-09-05 ENCOUNTER — Ambulatory Visit (HOSPITAL_COMMUNITY)
Admission: EM | Admit: 2016-09-05 | Discharge: 2016-09-05 | Disposition: A | Discharge status: Home / Self Care | Payer: BLUE CROSS/BLUE SHIELD | Attending: Family Medicine | Admitting: Family Medicine

## 2016-09-05 ENCOUNTER — Encounter (HOSPITAL_COMMUNITY): Payer: Self-pay | Admitting: *Deleted

## 2016-09-05 DIAGNOSIS — R6889 Other general symptoms and signs: Secondary | ICD-10-CM | POA: Diagnosis not present

## 2016-09-05 MED ORDER — GUAIFENESIN-CODEINE 100-10 MG/5ML PO SOLN: 5.0000 mL | Freq: Three times a day (TID) | ORAL | 0 refills | Status: DC | PRN

## 2016-09-05 NOTE — ED Provider Notes (Signed)
CSN: [id number]     Arrival date & time 09/05/16  1418 History   First MD Initiated Contact with Patient 09/05/16 1543     Chief Complaint  Patient presents with  . Cough  . Nasal Congestion   (Consider location/radiation/quality/duration/timing/severity/associated sxs/prior Treatment) 41 year old male presents to clinic with a 3 day history of fever, fatigue, loss of appetite, muscle aches, body aches, headaches, congestion, has some nausea, but no vomiting, diarrhea, or abdominal pain, he has had no chest pain, no chest tightness, no chest palpitations, he has been coughing, and occasionally short of breath, he does not smoke, has no history of asthma or other lung diseases, he denies any other symptoms.   The history is provided by the patient.  Cough    Past Medical History:  Diagnosis Date  . Anal fissure - posterior 02/15/2014  . Anxiety   . Chronic streptococcal tonsillitis    1-2 times annually for most of his life Archie Endo 09/26/2015  . CMV (cytomegalovirus infection) ([place]) 10/28/2010  . Crohn's disease ([place])   . Depression   . Essential hypertension, benign   . Fistula, perirectal   . Headache    history of but no issues currently  . Herpes zoster infection 07/2010, 12/2010   with post-herpetic neuralgia  . Hyperlipidemia   . Nephrolithiasis   . Obesity, unspecified   . Obstructive sleep apnea (adult) (pediatric)   . Plantar fasciitis of right foot 08/22/2013  . Pyelonephritis 2015  . Sinus infection 08/05/11  . Syncope   . Tinea pedis 06/27/2013  . Vitamin D deficiency    Past Surgical History:  Procedure Laterality Date  . COLON RESECTION    . COLON SURGERY    . COLONOSCOPY  10/14/2007   crohn's colitis, aphthous ulcers, mild anal stenosis  . GANGLION CYST EXCISION Right 2016  . HYPOSPADIAS CORRECTION     Childhood   . SMALL INTESTINE SURGERY  08/05/07   1 ft removed, ileo-cecectomy  . TONSILLECTOMY Bilateral 09/30/2015  . TONSILLECTOMY Bilateral 09/30/2015   Procedure: BILATERAL TONSILLECTOMY;  Surgeon: Izora Gala, MD;  Location: [place];  Service: ENT;  Laterality: Bilateral;  . TOOTH EXTRACTION    . UPPER GASTROINTESTINAL ENDOSCOPY  10/15/2010   w/biopsy, mild gastritis and duodenitis   Family History  Problem Relation Age of Onset  . Heart disease Father     CAD/MI  . Hyperlipidemia Father   . Hypertension Father   . Gout Father   . Sleep apnea Father   . Obesity Mother   . Allergies Mother   . Cancer Mother   . Stroke Maternal Grandfather   . Allergies Sister   . Asthma Sister   . Colon cancer Neg Hx   . Diabetes Neg Hx   . COPD Neg Hx    Social History  Substance Use Topics  . Smoking status: Never Smoker  . Smokeless tobacco: Never Used  . Alcohol use 1.8 oz/week    3 Shots of liquor per week    Review of Systems  Reason unable to perform ROS: as covered in HPI.  Respiratory: Positive for cough.   All other systems reviewed and are negative.   Allergies  Patient has no known allergies.  Home Medications   Prior to Admission medications   Medication Sig Start Date End Date Taking? Authorizing Provider  acetaminophen (TYLENOL) 325 MG tablet Take 650 mg by mouth every 6 (six) hours as needed for moderate pain.    Historical Provider, MD  acidophilus (  RISAQUAD) CAPS capsule Take 1 capsule by mouth daily.    Historical Provider, MD  ALPRAZolam (ALPRAZOLAM XR) 3 MG 24 hr tablet Take 1 tablet (3 mg total) by mouth every morning. 05/29/16   Biagio Borg, MD  alprazolam Duanne Moron) 2 MG tablet TAKE 1/2 A TAB BY MOUTH TWICE ADAY AS NEEDED 06/25/16   Biagio Borg, MD  colestipol (COLESTID) 1 g tablet Take 5 tablets (5 g total) by mouth daily. 06/30/16   Gatha Mayer, MD  diphenoxylate-atropine (LOMOTIL) 2.5-0.025 MG tablet Take 1 tablet by mouth 4 (four) times daily as needed for diarrhea or loose stools. 06/25/16   Gatha Mayer, MD  DULoxetine (CYMBALTA) 60 MG capsule Take 1 capsule (60 mg total) by mouth daily. To start after the  week of 30 mg dosing 03/26/16 03/26/17  Biagio Borg, MD  emtricitabine-tenofovir (TRUVADA) 200-300 MG per tablet Take 1 tablet by mouth daily. 10/02/14   Gatha Mayer, MD  guaiFENesin-codeine 100-10 MG/5ML syrup Take 5 mLs by mouth 3 (three) times daily as needed for cough. 09/05/16   Barnet Glasgow, NP  HUMIRA PEN 40 MG/0.8ML PNKT INJECT 1 PEN (40MG) SUBCUTANEOUSLY EVERY 14 DAYS. REFRIGERATE. 07/31/16   Gatha Mayer, MD  lisinopril-hydrochlorothiazide [facility]) 20-25 MG tablet TAKE 1 TABLET BY MOUTH DAILY 04/16/16   Biagio Borg, MD  ondansetron (ZOFRAN ODT) 4 MG disintegrating tablet Take 1 tablet (4 mg total) by mouth every 8 (eight) hours as needed for nausea or vomiting. 09/30/15   Izora Gala, MD  zolpidem (AMBIEN) 10 MG tablet Take 1 tablet (10 mg total) by mouth at bedtime as needed for sleep. 03/26/16   Biagio Borg, MD   Meds Ordered and Administered this Visit  Medications - No data to display  BP 109/78 (BP Location: Left Arm)   Pulse (!) 112 Comment: notified rn  Temp (!) 100.4 F (38 C) (Oral) Comment: notified rn  Resp 16   SpO2 97%  No data found.   Physical Exam  Constitutional: He is oriented to person, place, and time. He appears well-developed and well-nourished. No distress.  HENT:  Head: Normocephalic and atraumatic.  Right Ear: Tympanic membrane and external ear normal.  Left Ear: Tympanic membrane and external ear normal.  Nose: Nose normal. Right sinus exhibits no maxillary sinus tenderness and no frontal sinus tenderness. Left sinus exhibits no maxillary sinus tenderness and no frontal sinus tenderness.  Mouth/Throat: Uvula is midline and oropharynx is clear and moist. No oropharyngeal exudate.  Eyes: Pupils are equal, round, and reactive to light.  Neck: Normal range of motion. Neck supple. No JVD present.  Cardiovascular: Normal rate and regular rhythm.   Pulmonary/Chest: Effort normal and breath sounds normal. No respiratory distress. He has no  wheezes. He has no rhonchi.  Abdominal: Soft. Bowel sounds are normal.  Lymphadenopathy:       Head (right side): No submental, no submandibular, no tonsillar and no preauricular adenopathy present.       Head (left side): No submental, no submandibular, no tonsillar and no preauricular adenopathy present.    He has no cervical adenopathy.  Neurological: He is alert and oriented to person, place, and time.  Skin: Skin is warm and dry. Capillary refill takes less than 2 seconds. No rash noted. He is not diaphoretic. No erythema.  Psychiatric: He has a normal mood and affect.  Nursing note and vitals reviewed.   Urgent Care Course     Procedures (including critical care  time)  Labs Review Labs Reviewed - No data to display  Imaging Review No results found.      MDM   1. Flu-like symptoms     Flulike symptoms, however outside of the time frame for Tamiflu. Given Cheratussin for cough, advise rest, fluids, and management of symptoms with over the counter medicines. Follow-up with primary care should his symptoms persist or worsen.     Barnet Glasgow, NP 09/05/16 [phone]

## 2016-09-05 NOTE — Discharge Instructions (Signed)
You most likely have the flu, or a flulike illness, however, too much time has passed in order for Tamiflu to be effective. I advise rest, plenty of fluids and management of symptoms with over the counter medicines. For symptoms you may take Tylenol as needed every 4-6 hours for body aches or fever, not to exceed 4,000 mg a day, or ibuprofen every 6 hours. For congestion, you may use an inhaled steroid such as Flonase, 2 sprays each nostril once a day for congestion, or an antihistamine such as Claritin or Zyrtec once a day. Another option is pseudoephedrine containing products, the kind a medicine you get directly from a pharmacist. For your cough, prescribed Cheratussin, which is guaifenesin,  mixed with codeine. This is a narcotic medication, do not drink any alcohol while taking do not drive. Should your symptoms worsen or fail to resolve, follow up with your primary care provider or return to clinic.

## 2016-09-05 NOTE — ED Triage Notes (Signed)
Unsure of fevers.

## 2016-09-21 ENCOUNTER — Telehealth: Payer: Self-pay | Admitting: Internal Medicine

## 2016-09-21 MED ORDER — ADALIMUMAB 40 MG/0.8ML ~~LOC~~ AJKT: 1.0000 "pen " | AUTO-INJECTOR | SUBCUTANEOUS | 1 refills | Status: DC

## 2016-09-21 NOTE — Telephone Encounter (Signed)
rx sent My Chart message sent

## 2016-09-24 ENCOUNTER — Ambulatory Visit (HOSPITAL_COMMUNITY)
Admission: EM | Admit: 2016-09-24 | Discharge: 2016-09-24 | Disposition: A | Discharge status: Home / Self Care | Payer: BLUE CROSS/BLUE SHIELD | Attending: Family Medicine | Admitting: Family Medicine

## 2016-09-24 ENCOUNTER — Encounter (HOSPITAL_COMMUNITY): Payer: Self-pay | Admitting: Emergency Medicine

## 2016-09-24 ENCOUNTER — Other Ambulatory Visit: Payer: Self-pay | Admitting: Internal Medicine

## 2016-09-24 DIAGNOSIS — M545 Low back pain, unspecified: Secondary | ICD-10-CM

## 2016-09-24 MED ORDER — NAPROXEN 500 MG PO TABS: 500.0000 mg | ORAL_TABLET | Freq: Two times a day (BID) | ORAL | 0 refills | Status: DC

## 2016-09-24 MED ORDER — CYCLOBENZAPRINE HCL 10 MG PO TABS: 10.0000 mg | ORAL_TABLET | Freq: Two times a day (BID) | ORAL | 0 refills | Status: DC | PRN

## 2016-09-24 NOTE — ED Triage Notes (Signed)
PT reports left sided lower back that is worse with movement and deep breaths. PT denies urinary symptoms aside from noticing it was darker than normal once. PT reports pain with movements. PT is tender to palpation over affected area.

## 2016-09-24 NOTE — ED Provider Notes (Signed)
CSN: [id number]     Arrival date & time 09/24/16  1405 History   None    Chief Complaint  Patient presents with  . Back Pain   (Consider location/radiation/quality/duration/timing/severity/associated sxs/prior Treatment) Patient c/o left back pain and moderate pain level.  He denies injury.  He denies any radicular sx's.   The history is provided by the patient.  Back Pain  Location:  Lumbar spine Quality:  Aching Radiates to:  Does not radiate Pain severity:  Moderate Pain is:  Worse during the day Onset quality:  Sudden Duration:  1 day Timing:  Constant Chronicity:  New Relieved by:  Nothing Worsened by:  Nothing   Past Medical History:  Diagnosis Date  . Anal fissure - posterior 02/15/2014  . Anxiety   . Chronic streptococcal tonsillitis    1-2 times annually for most of his life Archie Endo 09/26/2015  . CMV (cytomegalovirus infection) ([place]) 10/28/2010  . Crohn's disease ([place])   . Depression   . Essential hypertension, benign   . Fistula, perirectal   . Headache    history of but no issues currently  . Herpes zoster infection 07/2010, 12/2010   with post-herpetic neuralgia  . Hyperlipidemia   . Nephrolithiasis   . Obesity, unspecified   . Obstructive sleep apnea (adult) (pediatric)   . Plantar fasciitis of right foot 08/22/2013  . Pyelonephritis 2015  . Sinus infection 08/05/11  . Syncope   . Tinea pedis 06/27/2013  . Vitamin D deficiency    Past Surgical History:  Procedure Laterality Date  . COLON RESECTION    . COLON SURGERY    . COLONOSCOPY  10/14/2007   crohn's colitis, aphthous ulcers, mild anal stenosis  . GANGLION CYST EXCISION Right 2016  . HYPOSPADIAS CORRECTION     Childhood   . SMALL INTESTINE SURGERY  08/05/07   1 ft removed, ileo-cecectomy  . TONSILLECTOMY Bilateral 09/30/2015  . TONSILLECTOMY Bilateral 09/30/2015   Procedure: BILATERAL TONSILLECTOMY;  Surgeon: Izora Gala, MD;  Location: [facility];  Service: ENT;  Laterality: Bilateral;  . TOOTH EXTRACTION     . UPPER GASTROINTESTINAL ENDOSCOPY  10/15/2010   w/biopsy, mild gastritis and duodenitis   Family History  Problem Relation Age of Onset  . Heart disease Father     CAD/MI  . Hyperlipidemia Father   . Hypertension Father   . Gout Father   . Sleep apnea Father   . Obesity Mother   . Allergies Mother   . Cancer Mother   . Stroke Maternal Grandfather   . Allergies Sister   . Asthma Sister   . Colon cancer Neg Hx   . Diabetes Neg Hx   . COPD Neg Hx    Social History  Substance Use Topics  . Smoking status: Never Smoker  . Smokeless tobacco: Never Used  . Alcohol use 1.8 oz/week    3 Shots of liquor per week    Review of Systems  Constitutional: Negative.   HENT: Negative.   Eyes: Negative.   Respiratory: Negative.   Cardiovascular: Negative.   Gastrointestinal: Negative.   Endocrine: Negative.   Genitourinary: Negative.   Musculoskeletal: Positive for back pain.  Allergic/Immunologic: Negative.   Neurological: Negative.   Hematological: Negative.   Psychiatric/Behavioral: Negative.     Allergies  Patient has no known allergies.  Home Medications   Prior to Admission medications   Medication Sig Start Date End Date Taking? Authorizing Provider  acetaminophen (TYLENOL) 325 MG tablet Take 650 mg by mouth every  6 (six) hours as needed for moderate pain.    Historical Provider, MD  acidophilus (RISAQUAD) CAPS capsule Take 1 capsule by mouth daily.    Historical Provider, MD  Adalimumab (HUMIRA PEN) 40 MG/0.8ML PNKT Inject 1 pen into the skin every 14 (fourteen) days. 09/21/16   Gatha Mayer, MD  ALPRAZolam (ALPRAZOLAM XR) 3 MG 24 hr tablet Take 1 tablet (3 mg total) by mouth every morning. 05/29/16   Biagio Borg, MD  alprazolam Duanne Moron) 2 MG tablet TAKE 1/2 A TAB BY MOUTH TWICE ADAY AS NEEDED 06/25/16   Biagio Borg, MD  colestipol (COLESTID) 1 g tablet Take 5 tablets (5 g total) by mouth daily. 06/30/16   Gatha Mayer, MD  cyclobenzaprine (FLEXERIL) 10 MG tablet Take  1 tablet (10 mg total) by mouth 2 (two) times daily as needed for muscle spasms. 09/24/16   Lysbeth Penner, FNP  diphenoxylate-atropine (LOMOTIL) 2.5-0.025 MG tablet Take 1 tablet by mouth 4 (four) times daily as needed for diarrhea or loose stools. 06/25/16   Gatha Mayer, MD  DULoxetine (CYMBALTA) 60 MG capsule Take 1 capsule (60 mg total) by mouth daily. To start after the week of 30 mg dosing 03/26/16 03/26/17  Biagio Borg, MD  emtricitabine-tenofovir (TRUVADA) 200-300 MG per tablet Take 1 tablet by mouth daily. 10/02/14   Gatha Mayer, MD  guaiFENesin-codeine 100-10 MG/5ML syrup Take 5 mLs by mouth 3 (three) times daily as needed for cough. 09/05/16   Barnet Glasgow, NP  lisinopril-hydrochlorothiazide (PRINZIDE,ZESTORETIC) 20-25 MG tablet TAKE 1 TABLET BY MOUTH DAILY 04/16/16   Biagio Borg, MD  naproxen (NAPROSYN) 500 MG tablet Take 1 tablet (500 mg total) by mouth 2 (two) times daily with a meal. 09/24/16   Lysbeth Penner, FNP  ondansetron (ZOFRAN ODT) 4 MG disintegrating tablet Take 1 tablet (4 mg total) by mouth every 8 (eight) hours as needed for nausea or vomiting. 09/30/15   Izora Gala, MD  zolpidem (AMBIEN) 10 MG tablet Take 1 tablet (10 mg total) by mouth at bedtime as needed for sleep. 03/26/16   Biagio Borg, MD   Meds Ordered and Administered this Visit  Medications - No data to display  BP 124/77   Pulse 81   Temp 98.6 F (37 C) (Oral)   Resp 14   Ht 5' 9"  (1.753 m)   Wt 226 lb (102.5 kg)   SpO2 98%   BMI 33.37 kg/m  No data found.   Physical Exam  Constitutional: He is oriented to person, place, and time. He appears well-developed and well-nourished.  HENT:  Head: Normocephalic and atraumatic.  Eyes: Conjunctivae and EOM are normal. Pupils are equal, round, and reactive to light.  Neck: Normal range of motion. Neck supple.  Cardiovascular: Normal rate, regular rhythm and normal heart sounds.   Pulmonary/Chest: Effort normal and breath sounds normal.  Abdominal:  Soft. Bowel sounds are normal.  Musculoskeletal: He exhibits tenderness.  TTP left lumbar spinal muscles.  Neurological: He is alert and oriented to person, place, and time.  Nursing note and vitals reviewed.   Urgent Care Course     Procedures (including critical care time)  Labs Review Labs Reviewed - No data to display  Imaging Review No results found.   Visual Acuity Review  Right Eye Distance:   Left Eye Distance:   Bilateral Distance:    Right Eye Near:   Left Eye Near:    Bilateral Near:  MDM   1. Acute left-sided low back pain without sciatica    Naprosyn 523m one po bid x 10 days 320 Flexeril 10 mg one po bid prn #20      WLysbeth Penner FNP 09/24/16 1513

## 2016-09-25 ENCOUNTER — Other Ambulatory Visit: Payer: Self-pay | Admitting: Internal Medicine

## 2016-09-25 NOTE — Telephone Encounter (Signed)
Faxed

## 2016-09-25 NOTE — Telephone Encounter (Signed)
Done hardcopy to Shirron  

## 2016-09-29 NOTE — Telephone Encounter (Signed)
Done hardcopy to Shirron  

## 2016-09-29 NOTE — Telephone Encounter (Signed)
faxed

## 2016-10-19 ENCOUNTER — Other Ambulatory Visit: Payer: Self-pay | Admitting: Internal Medicine

## 2016-10-19 NOTE — Telephone Encounter (Signed)
Routing to dr Jenny Reichmann, patient's last office visit was well over 18 months ago, except for seeing you in oct/2017 for anxiety/depression----are you ok with refilling bp med or do you need office visit--please advise, thanks

## 2016-10-19 NOTE — Telephone Encounter (Signed)
Teachey for one month, and would need ROV for further refills

## 2016-11-23 ENCOUNTER — Other Ambulatory Visit: Payer: Self-pay | Admitting: Internal Medicine

## 2016-11-24 NOTE — Telephone Encounter (Signed)
Done hardcopy to Shirron  

## 2016-11-24 NOTE — Telephone Encounter (Signed)
faxed

## 2016-12-10 ENCOUNTER — Other Ambulatory Visit: Payer: Self-pay | Admitting: Internal Medicine

## 2016-12-18 ENCOUNTER — Other Ambulatory Visit: Payer: Self-pay | Admitting: Internal Medicine

## 2016-12-18 NOTE — Telephone Encounter (Signed)
faxed

## 2016-12-18 NOTE — Telephone Encounter (Signed)
Done hardcopy to Shirron  

## 2017-01-11 ENCOUNTER — Other Ambulatory Visit: Payer: Self-pay | Admitting: Internal Medicine

## 2017-01-30 ENCOUNTER — Other Ambulatory Visit: Payer: Self-pay | Admitting: Internal Medicine

## 2017-02-01 NOTE — Telephone Encounter (Signed)
May I refill Sir?

## 2017-02-02 ENCOUNTER — Encounter: Payer: Self-pay | Admitting: Internal Medicine

## 2017-02-02 NOTE — Telephone Encounter (Signed)
Left message to call us back with an update of current symptoms.

## 2017-02-02 NOTE — Telephone Encounter (Signed)
Terril doing well , he is only taking the Lomotil one every AM instead of using Imodium.  I made him an October 12th appointment.  Please advise if okay to refill Sir.

## 2017-02-02 NOTE — Telephone Encounter (Signed)
Find out why he wants a refill ? sxs  He should make a f/u appt next available - sooner if sick

## 2017-02-03 ENCOUNTER — Other Ambulatory Visit: Payer: Self-pay | Admitting: Internal Medicine

## 2017-02-03 NOTE — Telephone Encounter (Signed)
[facility] [place] [facility].

## 2017-02-03 NOTE — Telephone Encounter (Signed)
Refill # 90 no refill

## 2017-02-03 NOTE — Telephone Encounter (Signed)
May I refill Sir, thank you for your time.

## 2017-02-04 NOTE — Telephone Encounter (Signed)
Refill sent in for zofran.

## 2017-02-04 NOTE — Telephone Encounter (Signed)
Refill x 3 

## 2017-03-24 ENCOUNTER — Telehealth: Payer: Self-pay | Admitting: Internal Medicine

## 2017-03-24 NOTE — Telephone Encounter (Signed)
Done hardcopy to Shirron  

## 2017-03-24 NOTE — Telephone Encounter (Signed)
Faxed

## 2017-03-25 NOTE — Telephone Encounter (Signed)
Pt called said that pharmacy did not get script, can this be re faxed?

## 2017-03-26 ENCOUNTER — Other Ambulatory Visit: Payer: Self-pay

## 2017-03-26 MED ORDER — ALPRAZOLAM ER 3 MG PO TB24: 3.0000 mg | ORAL_TABLET | Freq: Every morning | ORAL | 0 refills | Status: DC

## 2017-03-26 MED ORDER — ALPRAZOLAM ER 3 MG PO TB24: 3.0000 mg | ORAL_TABLET | Freq: Every morning | ORAL | 3 refills | Status: DC

## 2017-03-26 NOTE — Telephone Encounter (Signed)
Done hardcopy to Shirron  

## 2017-03-26 NOTE — Telephone Encounter (Signed)
faxed

## 2017-03-26 NOTE — Telephone Encounter (Signed)
Script has been refaxed

## 2017-03-26 NOTE — Telephone Encounter (Signed)
[facility] stating that [facility] for the Xanax 20m but they need a script for the Xanax XR [id number]. Patient takes both, please advise.

## 2017-04-02 ENCOUNTER — Ambulatory Visit (INDEPENDENT_AMBULATORY_CARE_PROVIDER_SITE_OTHER): Payer: BLUE CROSS/BLUE SHIELD | Admitting: Internal Medicine

## 2017-04-02 ENCOUNTER — Other Ambulatory Visit (INDEPENDENT_AMBULATORY_CARE_PROVIDER_SITE_OTHER): Payer: BLUE CROSS/BLUE SHIELD

## 2017-04-02 ENCOUNTER — Encounter: Payer: Self-pay | Admitting: Internal Medicine

## 2017-04-02 ENCOUNTER — Other Ambulatory Visit: Payer: Self-pay | Admitting: Internal Medicine

## 2017-04-02 VITALS — BP 112/68 | HR 62 | Ht 69.0 in | Wt 234.2 lb

## 2017-04-02 DIAGNOSIS — K50818 Crohn's disease of both small and large intestine with other complication: Secondary | ICD-10-CM

## 2017-04-02 DIAGNOSIS — R197 Diarrhea, unspecified: Secondary | ICD-10-CM

## 2017-04-02 DIAGNOSIS — D849 Immunodeficiency, unspecified: Secondary | ICD-10-CM

## 2017-04-02 DIAGNOSIS — Z7253 High risk bisexual behavior: Secondary | ICD-10-CM

## 2017-04-02 DIAGNOSIS — D899 Disorder involving the immune mechanism, unspecified: Secondary | ICD-10-CM

## 2017-04-02 LAB — CBC WITH DIFFERENTIAL/PLATELET
BASOS ABS: 0.1 10*3/uL (ref 0.0–0.1)
BASOS PCT: 1.1 % (ref 0.0–3.0)
EOS ABS: 0.1 10*3/uL (ref 0.0–0.7)
Eosinophils Relative: 1.4 % (ref 0.0–5.0)
HEMATOCRIT: 42.9 % (ref 39.0–52.0)
HEMOGLOBIN: 14.4 g/dL (ref 13.0–17.0)
LYMPHS PCT: 38.2 % (ref 12.0–46.0)
Lymphs Abs: 3.2 10*3/uL (ref 0.7–4.0)
MCHC: 33.6 g/dL (ref 30.0–36.0)
MCV: 97.8 fl (ref 78.0–100.0)
MONO ABS: 0.7 10*3/uL (ref 0.1–1.0)
Monocytes Relative: 8.6 % (ref 3.0–12.0)
Neutro Abs: 4.2 10*3/uL (ref 1.4–7.7)
Neutrophils Relative %: 50.7 % (ref 43.0–77.0)
Platelets: 275 10*3/uL (ref 150.0–400.0)
RBC: 4.39 Mil/uL (ref 4.22–5.81)
RDW: 14.7 % (ref 11.5–15.5)
WBC: 8.3 10*3/uL (ref 4.0–10.5)

## 2017-04-02 LAB — COMPREHENSIVE METABOLIC PANEL
ALBUMIN: 4.3 g/dL (ref 3.5–5.2)
ALT: 14 U/L (ref 0–53)
AST: 15 U/L (ref 0–37)
Alkaline Phosphatase: 57 U/L (ref 39–117)
BILIRUBIN TOTAL: 0.3 mg/dL (ref 0.2–1.2)
BUN: 13 mg/dL (ref 6–23)
CALCIUM: 9.2 mg/dL (ref 8.4–10.5)
CHLORIDE: 100 meq/L (ref 96–112)
CO2: 26 meq/L (ref 19–32)
CREATININE: 0.84 mg/dL (ref 0.40–1.50)
GFR: 106.47 mL/min (ref >60.00)
Glucose, Bld: 97 mg/dL (ref 70–99)
Potassium: 4 mEq/L (ref 3.5–5.1)
SODIUM: 136 meq/L (ref 135–145)
Total Protein: 7.6 g/dL (ref 6.0–8.3)

## 2017-04-02 LAB — C-REACTIVE PROTEIN: CRP: 0.2 mg/dL — ABNORMAL LOW (ref 0.5–20.0)

## 2017-04-02 MED ORDER — ZOSTER VAC RECOMB ADJUVANTED 50 MCG/0.5ML IM SUSR: 0.5000 mL | Freq: Once | INTRAMUSCULAR | 1 refills | Status: AC

## 2017-04-02 MED ORDER — EMTRICITABINE-TENOFOVIR DF 200-300 MG PO TABS: 1.0000 | ORAL_TABLET | Freq: Every day | ORAL | 3 refills | Status: DC

## 2017-04-02 NOTE — Assessment & Plan Note (Addendum)
He's having a week's worth of diarrhea, question is this Crohn's flaring, is it perhaps C. difficile related to his antibiotic use related to the dental extractions. I'm going to check adalimumab antibodies and trough levels. C. difficile PCR be checked. Complete blood count, metabolic panel and C-reactive protein will also be checked. Further plans pending these results. He had been on azathioprine in the past but he got a CMV infection so we have avoided that.

## 2017-04-02 NOTE — Progress Notes (Signed)
[name] [name] 41 y.o. 19-Sep-1975 [id number]  Assessment & Plan:   Encounter Diagnoses  Name Primary?  . Diarrhea, unspecified type Yes  . Crohn's disease of both small and large intestine with other complication ([place])   . High risk bisexual behavior   . Immunosuppression ([place])     CROHN'S DISEASE, LARGE AND SMALL INTESTINES He's having a week's worth of diarrhea, question is this Crohn's flaring, is it perhaps C. difficile related to his antibiotic use related to the dental extractions. I'm going to check adalimumab antibodies and trough levels. C. difficile PCR be checked. Complete blood count, metabolic panel and C-reactive protein will also be checked. Further plans pending these results. He had been on azathioprine in the past but he got a CMV infection so we have avoided that.  High risk sexual behavior I believe his partner is HIV eyes that. He is on Truvada but requests an HIV test and we will do so.  Immunosuppression He has had his flu shot. He is asking about HPV vaccine which now has been approved up to age 55 so I think in his situation that will make sense await his HIV testing. Go ahead and prescribe she vaccine given his history with shingles in the past. Consider pneumonia vaccines.  I appreciate the opportunity to care for this patient. CC: [name] Borg, MD     Subjective:   Chief Complaint:Diarrhea 1 week  HPI [name] [name] is here today for follow-up, I last saw him January of this year, he has had about a week's worth of watery diarrhea. No fever. He is having some upper abdominal cramping with this. He does report having taken clindamycin (he thinks) or some antibiotic because he had 2 dental extractions in the past couple of months. He does sometimes not take his colestipol but is faithful with his Humira. He is due for a dose, was due to today but has held off because his had the sniffles. As far as colestipol if he takes 5 g that will cause severe  constipation so he adjusts it. It has helped his diarrhea. However it's not helping this problem that developed. He is asking to have a routine HIV checked today as we have done in the past and a refill on his Truvada for protection from HIV. Work is going well at [employer] stressful but he doesn't have to work other jobs. No Known Allergies Current Meds  Medication Sig  . acetaminophen (TYLENOL) 325 MG tablet Take 650 mg by mouth every 6 (six) hours as needed for moderate pain.  Marland Kitchen acidophilus (RISAQUAD) CAPS capsule Take 1 capsule by mouth daily.  . Adalimumab (HUMIRA PEN) 40 MG/0.8ML PNKT Inject 1 pen into the skin every 14 (fourteen) days.  . ALPRAZolam (XANAX XR) 3 MG 24 hr tablet Take 1 tablet (3 mg total) by mouth every morning.  Marland Kitchen alprazolam (XANAX) 2 MG tablet TAKE 1/2 TABLET BY MOUTH TWICE A DAY AS NEEDED  . colestipol (COLESTID) 1 g tablet Take 5 tablets (5 g total) by mouth daily.  . diphenoxylate-atropine (LOMOTIL) 2.5-0.025 MG tablet TAKE 1 TABLET BY MOUTH 4 TIMES A DAY AS NEEDED FOR DIARRHEA OR LOOSE STOOLS  . DULoxetine (CYMBALTA) 60 MG capsule TAKE 1 CAPSULE (60 MG TOTAL) BY MOUTH DAILY (TO START AFTER THE WEEK OF 30 MG)  . emtricitabine-tenofovir (TRUVADA) 200-300 MG per tablet Take 1 tablet by mouth daily.  Marland Kitchen lisinopril-hydrochlorothiazide (PRINZIDE,ZESTORETIC) 20-25 MG tablet TAKE 1 TABLET BY MOUTH EVERY DAY  .  ondansetron (ZOFRAN) 4 MG tablet TAKE 1 TAB BY MOUTH EVERY 6 HOURS  . zolpidem (AMBIEN) 10 MG tablet TAKE 1 TABLET BY MOUTH AT BEDTIME AS NEEDED FOR SLEEP   Past Medical History:  Diagnosis Date  . Anal fissure - posterior 02/15/2014  . Anxiety   . Chronic streptococcal tonsillitis    1-2 times annually for most of his life Archie Endo 09/26/2015  . CMV (cytomegalovirus infection) ([place]) 10/28/2010  . Crohn's disease ([place])   . Depression   . Essential hypertension, benign   . Fistula, perirectal   . Headache    history of but no issues currently  . Herpes zoster  infection 07/2010, 12/2010   with post-herpetic neuralgia  . Hyperlipidemia   . Nephrolithiasis   . Obesity, unspecified   . Obstructive sleep apnea (adult) (pediatric)   . Plantar fasciitis of right foot 08/22/2013  . Pyelonephritis 2015  . Sinus infection 08/05/11  . Syncope   . Tinea pedis 06/27/2013  . Vitamin D deficiency    Past Surgical History:  Procedure Laterality Date  . COLONOSCOPY  10/14/2007   crohn's colitis, aphthous ulcers, mild anal stenosis  . GANGLION CYST EXCISION Right 2016  . HYPOSPADIAS CORRECTION     Childhood   . ILEOCECETOMY  08/05/07   1 ft removed, ileo-cecectomy  . TONSILLECTOMY Bilateral 09/30/2015   Procedure: BILATERAL TONSILLECTOMY;  Surgeon: Izora Gala, MD;  Location: [facility];  Service: ENT;  Laterality: Bilateral;  . TOOTH EXTRACTION    . UPPER GASTROINTESTINAL ENDOSCOPY  10/15/2010   w/biopsy, mild gastritis and duodenitis   Social History   Social History  . Marital status: Divorced    Spouse name: N/A  . Number of children: 2  . Years of education: 14   Occupational History  . [employer]   Social History Main Topics  . Smoking status: Never Smoker  . Smokeless tobacco: Never Used  . Alcohol use 1.8 oz/week    3 Shots of liquor per week  . Drug use: No  . Sexual activity: Yes    Partners: Male     Comment: Truvada protection   Social History Narrative   HSG, Deport - 2 years. Married '99 - 50yr/divorced. 2 sons - '98, '00 split custody. 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 No history of physical or sexual abuse.    [name] [employer]   family history includes Allergies in his mother and sister; Asthma in his sister; Cancer in his mother; Gout in his father; Heart disease in his father; Hyperlipidemia in his father; Hypertension in his father; Obesity in his mother; Sleep apnea in his father; Stroke in his maternal grandfather.  Review of Systems As per history of present illness  Objective:    Physical Exam BP 112/68   Pulse 62   Ht 5' 9"  (1.753 m)   Wt 234 lb 4 oz (106.3 kg)   BMI 34.59 kg/m  Obese no acute distress Eyes are anicteric Lungs clear Heart normal sounds no rubs murmurs or gallops The abdomen is obese with well-healed surgical scars, bowel sounds are low but increased there is no increased tympany or distention, he is mildly tender in the epigastrium. No cyanosis clubbing detected in the extremities Mood is appropriate

## 2017-04-02 NOTE — Telephone Encounter (Signed)
Done hardcopy to Shirron  Please let pt know, he is due for ROV

## 2017-04-02 NOTE — Patient Instructions (Signed)
   Your physician has requested that you go to the basement for the  lab work before leaving today.   We are giving you a rx to get a shingles vaccine at your pharmacy.   We have sent the following medications to your pharmacy for you to pick up at your convenience: Truvada   I appreciate the opportunity to care for you. Silvano Rusk, MD, [facility]

## 2017-04-02 NOTE — Assessment & Plan Note (Addendum)
I believe his partner is HIV eyes that. He is on Truvada but requests an HIV test and we will do so.

## 2017-04-02 NOTE — Assessment & Plan Note (Signed)
He has had his flu shot. He is asking about HPV vaccine which now has been approved up to age 42 so I think in his situation that will make sense await his HIV testing. Go ahead and prescribe she vaccine given his history with shingles in the past. Consider pneumonia vaccines.

## 2017-04-02 NOTE — Telephone Encounter (Signed)
Faxed

## 2017-04-03 LAB — HIV ANTIBODY (ROUTINE TESTING W REFLEX): HIV: NONREACTIVE

## 2017-04-05 NOTE — Progress Notes (Signed)
My Chart note Labs ok so far

## 2017-04-09 ENCOUNTER — Encounter: Payer: Self-pay | Admitting: Internal Medicine

## 2017-04-09 LAB — ADALIMUMAB+AB (SERIAL MONITOR): ADALIMUMAB DRUG LEVEL: 3.2 ug/mL

## 2017-04-09 LAB — SERIAL MONITORING

## 2017-04-11 ENCOUNTER — Other Ambulatory Visit: Payer: Self-pay | Admitting: Internal Medicine

## 2017-04-14 ENCOUNTER — Other Ambulatory Visit: Payer: BLUE CROSS/BLUE SHIELD

## 2017-04-14 DIAGNOSIS — R197 Diarrhea, unspecified: Secondary | ICD-10-CM

## 2017-04-14 DIAGNOSIS — K50818 Crohn's disease of both small and large intestine with other complication: Secondary | ICD-10-CM | POA: Diagnosis not present

## 2017-04-15 ENCOUNTER — Encounter: Payer: Self-pay | Admitting: Internal Medicine

## 2017-04-15 LAB — CLOSTRIDIUM DIFFICILE BY PCR: Toxigenic C. Difficile by PCR: NOT DETECTED

## 2017-04-16 NOTE — Progress Notes (Signed)
Patient informed by My Chart message

## 2017-04-19 ENCOUNTER — Other Ambulatory Visit: Payer: Self-pay | Admitting: Internal Medicine

## 2017-04-20 ENCOUNTER — Other Ambulatory Visit: Payer: Self-pay | Admitting: Internal Medicine

## 2017-04-20 NOTE — Telephone Encounter (Signed)
Okay to refill Sir?

## 2017-04-21 ENCOUNTER — Other Ambulatory Visit: Payer: Self-pay | Admitting: Internal Medicine

## 2017-04-21 MED ORDER — ALPRAZOLAM ER 3 MG PO TB24: 3.0000 mg | ORAL_TABLET | Freq: Every morning | ORAL | 0 refills | Status: DC

## 2017-04-21 MED ORDER — ZOLPIDEM TARTRATE 10 MG PO TABS: 10.0000 mg | ORAL_TABLET | Freq: Every evening | ORAL | 0 refills | Status: DC | PRN

## 2017-04-21 NOTE — Telephone Encounter (Signed)
Faxed

## 2017-04-21 NOTE — Telephone Encounter (Signed)
Done hardcopy to Shirron  

## 2017-04-21 NOTE — Telephone Encounter (Signed)
[facility] [place], fax # [phone], patient informed.

## 2017-04-22 NOTE — Telephone Encounter (Signed)
Verbally [facility] the [facility] since it was sent in last night with no refills.

## 2017-04-22 NOTE — Telephone Encounter (Signed)
Refill x 3 

## 2017-04-28 ENCOUNTER — Ambulatory Visit (AMBULATORY_SURGERY_CENTER): Payer: Self-pay | Admitting: *Deleted

## 2017-04-28 VITALS — Ht 69.0 in | Wt 232.0 lb

## 2017-04-28 DIAGNOSIS — K50818 Crohn's disease of both small and large intestine with other complication: Secondary | ICD-10-CM

## 2017-04-28 NOTE — Progress Notes (Signed)
No egg or soy allergy known to patient  No issues with past sedation with any surgeries  or procedures, no intubation problems  No diet pills per patient No home 02 use per patient  No blood thinners per patient  Pt denies issues with constipation  No A fib or A flutter  EMMI video sent to pt's e mail -pt declined

## 2017-05-10 ENCOUNTER — Encounter: Payer: Self-pay | Admitting: Internal Medicine

## 2017-05-10 ENCOUNTER — Ambulatory Visit (INDEPENDENT_AMBULATORY_CARE_PROVIDER_SITE_OTHER): Payer: BLUE CROSS/BLUE SHIELD | Admitting: Internal Medicine

## 2017-05-10 VITALS — BP 122/86 | HR 103 | Temp 98.3°F | Ht 69.0 in | Wt 234.0 lb

## 2017-05-10 DIAGNOSIS — Z0000 Encounter for general adult medical examination without abnormal findings: Secondary | ICD-10-CM | POA: Diagnosis not present

## 2017-05-10 DIAGNOSIS — I10 Essential (primary) hypertension: Secondary | ICD-10-CM | POA: Diagnosis not present

## 2017-05-10 DIAGNOSIS — Z0001 Encounter for general adult medical examination with abnormal findings: Secondary | ICD-10-CM | POA: Insufficient documentation

## 2017-05-10 MED ORDER — LISINOPRIL-HYDROCHLOROTHIAZIDE 20-25 MG PO TABS: 1.0000 | ORAL_TABLET | Freq: Every day | ORAL | 3 refills | Status: DC

## 2017-05-10 NOTE — Patient Instructions (Addendum)
Please continue all other medications as before, and refills have been done if requested.  Please have the pharmacy call with any other refills you may need.  Please continue your efforts at being more active, low cholesterol diet, and weight control.  You are otherwise up to date with prevention measures today.  Please keep your appointments with your specialists as you may have planned  Please go to the LAB in the Basement (turn left off the elevator) for the tests to be done at your convenience  You will be contacted by phone if any changes need to be made immediately.  Otherwise, you will receive a letter about your results with an explanation, but please check with MyChart first.  Please remember to sign up for MyChart if you have not done so, as this will be important to you in the future with finding out test results, communicating by private email, and scheduling acute appointments online when needed.  Please return in 1 year for your yearly visit, or sooner if needed, with Lab testing done 3-5 days before  

## 2017-05-10 NOTE — Assessment & Plan Note (Signed)

## 2017-05-10 NOTE — Progress Notes (Signed)
Subjective:    Patient ID: [name] [name], male    DOB: [birth year], 41 y.o.   MRN: [id number]  HPI  Here for wellness and f/u;  Overall doing ok;  Pt denies Chest pain, worsening SOB, DOE, wheezing, orthopnea, PND, worsening LE edema, palpitations, dizziness or syncope.  Pt denies neurological change such as new headache, facial or extremity weakness.  Pt denies polydipsia, polyuria, or low sugar symptoms. Pt states overall good compliance with treatment and medications, good tolerability, and has been trying to follow appropriate diet.  Pt denies worsening depressive symptoms, suicidal ideation or panic. No fever, night sweats, wt loss, loss of appetite, or other constitutional symptoms.  Pt states good ability with ADL's, has low fall risk, home safety reviewed and adequate, no other significant changes in hearing or vision, and only occasionally active with exercise.  Due for colonoscpy next wk, requests labs that am. Past Medical History:  Diagnosis Date  . Allergy   . Anal fissure - posterior 02/15/2014  . Anxiety   . Chronic streptococcal tonsillitis    1-2 times annually for most of his life Archie Endo 09/26/2015  . CMV (cytomegalovirus infection) ([place]) 10/28/2010  . Crohn's disease ([place])   . Depression   . Essential hypertension, benign   . Fistula, perirectal   . Headache    history of but no issues currently  . Herpes zoster infection 07/2010, 12/2010   with post-herpetic neuralgia  . Hyperlipidemia   . Nephrolithiasis   . Obesity, unspecified   . Obstructive sleep apnea (adult) (pediatric)    resolved since tonsils removed   . Plantar fasciitis of right foot 08/22/2013  . Pyelonephritis 2015  . Sinus infection 08/05/11  . Syncope   . Tinea pedis 06/27/2013  . Vitamin D deficiency    Past Surgical History:  Procedure Laterality Date  . BILATERAL TONSILLECTOMY Bilateral 09/30/2015   Performed by Izora Gala, MD at [facility]  2009  . COLONOSCOPY  10/14/2007   crohn's  colitis, aphthous ulcers, mild anal stenosis  . GANGLION CYST EXCISION Right 2016  . HYPOSPADIAS CORRECTION     Childhood   . ILEOCECETOMY  08/05/07   1 ft removed, ileo-cecectomy  . TOOTH EXTRACTION    . UPPER GASTROINTESTINAL ENDOSCOPY  10/15/2010   w/biopsy, mild gastritis and duodenitis    reports that  has never smoked. he has never used smokeless tobacco. He reports that he drinks about 1.8 oz of alcohol per week. He reports that he does not use drugs. family history includes Allergies in his mother and sister; Asthma in his sister; Cancer in his mother; Gout in his father; Heart disease in his father; Hyperlipidemia in his father; Hypertension in his father; Obesity in his mother; Sleep apnea in his father; Stroke in his maternal grandfather. No Known Allergies Current Outpatient Medications on File Prior to Visit  Medication Sig Dispense Refill  . acetaminophen (TYLENOL) 325 MG tablet Take 650 mg by mouth every 6 (six) hours as needed for moderate pain.    Marland Kitchen acidophilus (RISAQUAD) CAPS capsule Take 1 capsule by mouth daily.    . Adalimumab (HUMIRA PEN) 40 MG/0.8ML PNKT Inject 1 pen into the skin every 14 (fourteen) days. 6 each 1  . ALPRAZolam (XANAX XR) 3 MG 24 hr tablet Take 1 tablet (3 mg total) by mouth every morning. To fill Apr 25, 2017 30 tablet 0  . alprazolam (XANAX) 2 MG tablet TAKE 1/2 TABLET BY MOUTH TWICE A  DAY AS NEEDED 30 tablet 2  . bisacodyl (DULCOLAX) 5 MG EC tablet Take 5 mg once by mouth. X 4 for colon 12-6    . colestipol (COLESTID) 1 g tablet Take 5 tablets (5 g total) by mouth daily. 150 tablet 11  . diphenoxylate-atropine (LOMOTIL) 2.5-0.025 MG tablet TAKE 1 TABLET BY MOUTH 4 TIMES A DAY AS NEEDED FOR DIARRHEA OR LOOSE STOOLS (Patient taking differently: TAKE 1 TABLET BY MOUTH 4 TIMES A DAY AS NEEDED FOR DIARRHEA OR LOOSE STOOLS- takes one a day) 90 tablet 0  . DULoxetine (CYMBALTA) 60 MG capsule TAKE 1 CAPSULE (60 MG TOTAL) BY MOUTH DAILY (TO START AFTER THE WEEK  OF 30 MG) 30 capsule 2  . emtricitabine-tenofovir (TRUVADA) 200-300 MG tablet Take 1 tablet by mouth daily. 90 tablet 3  . ondansetron (ZOFRAN) 4 MG tablet TAKE 1 TAB BY MOUTH EVERY 6 HOURS 20 tablet 2  . polyethylene glycol powder (MIRALAX) powder Take 1 Container once by mouth. 238 grams for colon 12-6    . zolpidem (AMBIEN) 10 MG tablet Take 1 tablet (10 mg total) by mouth at bedtime as needed. for sleep - to fill May 04, 2017 30 tablet 0   No current facility-administered medications on file prior to visit.    Review of Systems Constitutional: Negative for other unusual diaphoresis, sweats, appetite or weight changes HENT: Negative for other worsening hearing loss, ear pain, facial swelling, mouth sores or neck stiffness.   Eyes: Negative for other worsening pain, redness or other visual disturbance.  Respiratory: Negative for other stridor or swelling Cardiovascular: Negative for other palpitations or other chest pain  Gastrointestinal: Negative for worsening diarrhea or loose stools, blood in stool, distention or other pain Genitourinary: Negative for hematuria, flank pain or other change in urine volume.  Musculoskeletal: Negative for myalgias or other joint swelling.  Skin: Negative for other color change, or other wound or worsening drainage.  Neurological: Negative for other syncope or numbness. Hematological: Negative for other adenopathy or swelling Psychiatric/Behavioral: Negative for hallucinations, other worsening agitation, SI, self-injury, or new decreased concentration\All other system neg per pt    Objective:   Physical Exam BP 122/86   Pulse (!) 103   Temp 98.3 F (36.8 C) (Oral)   Ht 5' 9"  (1.753 m)   Wt 234 lb (106.1 kg)   SpO2 96%   BMI 34.56 kg/m  VS noted,  Constitutional: Pt is oriented to person, place, and time. Appears well-developed and well-nourished, in no significant distress and comfortable Head: Normocephalic and atraumatic  Eyes: Conjunctivae  and EOM are normal. Pupils are equal, round, and reactive to light Right Ear: External ear normal without discharge Left Ear: External ear normal without discharge Nose: Nose without discharge or deformity Mouth/Throat: Oropharynx is without other ulcerations and moist  Neck: Normal range of motion. Neck supple. No JVD present. No tracheal deviation present or significant neck LA or mass Cardiovascular: Normal rate, regular rhythm, normal heart sounds and intact distal pulses.   Pulmonary/Chest: WOB normal and breath sounds without rales or wheezing  Abdominal: Soft. Bowel sounds are normal. NT. No HSM  Musculoskeletal: Normal range of motion. Exhibits no edema Lymphadenopathy: Has no other cervical adenopathy.  Neurological: Pt is alert and oriented to person, place, and time. Pt has normal reflexes. No cranial nerve deficit. Motor grossly intact, Gait intact Skin: Skin is warm and dry. No rash noted or new ulcerations Psychiatric:  Has normal mood and affect. Behavior is normal without agitation  Lab Results  Component Value Date   WBC 8.3 04/02/2017   HGB 14.4 04/02/2017   HCT 42.9 04/02/2017   PLT 275.0 04/02/2017   GLUCOSE 97 04/02/2017   ALT 14 04/02/2017   AST 15 04/02/2017   NA 136 04/02/2017   K 4.0 04/02/2017   CL 100 04/02/2017   CREATININE 0.84 04/02/2017   BUN 13 04/02/2017   CO2 26 04/02/2017   TSH 2.42 04/10/2011      Assessment & Plan:

## 2017-05-10 NOTE — Assessment & Plan Note (Signed)
stable overall by history and exam, recent data reviewed with pt, and pt to continue medical treatment as before,  to f/u any worsening symptoms or concerns BP Readings from Last 3 Encounters:  05/10/17 122/86  04/02/17 112/68  09/24/16 124/77

## 2017-05-19 ENCOUNTER — Encounter: Payer: Self-pay | Admitting: Internal Medicine

## 2017-05-19 ENCOUNTER — Other Ambulatory Visit: Payer: Self-pay

## 2017-05-19 ENCOUNTER — Ambulatory Visit (AMBULATORY_SURGERY_CENTER): Payer: BLUE CROSS/BLUE SHIELD | Admitting: Internal Medicine

## 2017-05-19 VITALS — BP 124/80 | HR 65 | Temp 97.7°F | Resp 14 | Ht 69.0 in | Wt 232.0 lb

## 2017-05-19 DIAGNOSIS — G4733 Obstructive sleep apnea (adult) (pediatric): Secondary | ICD-10-CM

## 2017-05-19 DIAGNOSIS — K50818 Crohn's disease of both small and large intestine with other complication: Secondary | ICD-10-CM | POA: Diagnosis present

## 2017-05-19 DIAGNOSIS — K633 Ulcer of intestine: Secondary | ICD-10-CM | POA: Diagnosis not present

## 2017-05-19 DIAGNOSIS — K9189 Other postprocedural complications and disorders of digestive system: Secondary | ICD-10-CM | POA: Diagnosis not present

## 2017-05-19 MED ORDER — SODIUM CHLORIDE 0.9 % IV SOLN: 500.0000 mL | INTRAVENOUS | Status: DC

## 2017-05-19 NOTE — Op Note (Signed)
[facility] Patient Name: [name] [name] Procedure Date: 05/19/2017 2:23 PM MRN: [id number] Endoscopist: Gatha Mayer , MD Age: 41 Referring MD:  Date of Birth: 12-07-1975 Gender: Male Account #: [id number] Procedure:                Colonoscopy Indications:              Follow-up of Crohn's disease of the small bowel Medicines:                Propofol per Anesthesia, Monitored Anesthesia Care Procedure:                Pre-Anesthesia Assessment:                           - Prior to the procedure, a History and Physical                            was performed, and patient medications and                            allergies were reviewed. The patient's tolerance of                            previous anesthesia was also reviewed. The risks                            and benefits of the procedure and the sedation                            options and risks were discussed with the patient.                            All questions were answered, and informed consent                            was obtained. Prior Anticoagulants: The patient has                            taken no previous anticoagulant or antiplatelet                            agents. ASA Grade Assessment: II - A patient with                            mild systemic disease. After reviewing the risks                            and benefits, the patient was deemed in                            satisfactory condition to undergo the procedure.                           After obtaining informed consent, the colonoscope  was passed under direct vision. Throughout the                            procedure, the patient's blood pressure, pulse, and                            oxygen saturations were monitored continuously. The                            Colonoscope was introduced through the anus and                            advanced to the the terminal ileum. The colonoscopy                             was performed without difficulty. The patient                            tolerated the procedure well. The quality of the                            bowel preparation was excellent. The rectum and                            Ileocolonic anastomsis areas were photographed. The                            bowel preparation used was Miralax. Scope In: 2:35:09 PM Scope Out: 2:48:31 PM Scope Withdrawal Time: 0 hours 11 minutes 7 seconds  Total Procedure Duration: 0 hours 13 minutes 22 seconds  Findings:                 The perianal and digital rectal examinations were                            normal. Pertinent negatives include normal prostate                            (size, shape, and consistency).                           The neo-terminal ileum appeared normal. Biopsies                            were taken with a cold forceps for histology.                            Verification of patient identification for the                            specimen was done. Estimated blood loss was minimal.                           There was evidence of a prior end-to-end  ileo-colonic anastomosis in the ascending colon.                            This was patent and was characterized by slight                            ulceration. The anastomosis could not be traversed                            but scope position allowed inspection of terminal                            ileum and biopsies.. This was biopsied with a cold                            forceps for histology. Verification of patient                            identification for the specimen was done. Estimated                            blood loss was minimal.                           The exam was otherwise without abnormality on                            direct and retroflexion views. Complications:            No immediate complications. Estimated Blood Loss:     Estimated blood loss was  minimal. Impression:               - The examined portion of the ileum was normal.                            Biopsied.                           - Patent end-to-end ileo-colonic anastomosis,                            characterized by ulceration. Biopsied.                           - The examination was otherwise normal on direct                            and retroflexion views.                           - Slight inflammation an ielo-colonic anastomosis -                            probably not causing his diarrhea.  Suspect may be from hemi-colectomy and bile salt                            malabsorption. Recommendation:           - Patient has a contact number available for                            emergencies. The signs and symptoms of potential                            delayed complications were discussed with the                            patient. Return to normal activities tomorrow.                            Written discharge instructions were provided to the                            patient.                           - Resume previous diet.                           - Continue present medications.                           - Await pathology results.                           - Repeat colonoscopy is recommended. The                            colonoscopy date will be determined after pathology                            results from today's exam become available for                            review.                           - Next likely step is to increase bile salt binder                            dose Gatha Mayer, MD 05/19/2017 2:58:01 PM This report has been signed electronically.

## 2017-05-19 NOTE — Patient Instructions (Addendum)
   Just slight inflammation at the connection of small and large intestine. I do not think that would be causing your problems.  I took biopsies and will let you know what I think you should do.  I appreciate the opportunity to care for you. Gatha Mayer, MD, FACG   YOU HAD AN ENDOSCOPIC PROCEDURE TODAY: Refer to the procedure report and other information in the discharge instructions given to you for any specific questions about what was found during the examination. If this information does not answer your questions, please call [facility] office at [phone] to clarify.   YOU SHOULD EXPECT: Some feelings of bloating in the abdomen. Passage of more gas than usual. Walking can help get rid of the air that was put into your GI tract during the procedure and reduce the bloating. If you had a lower endoscopy (such as a colonoscopy or flexible sigmoidoscopy) you may notice spotting of blood in your stool or on the toilet paper. Some abdominal soreness may be present for a day or two, also.  DIET: Your first meal following the procedure should be a light meal and then it is ok to progress to your normal diet. A half-sandwich or bowl of soup is an example of a good first meal. Heavy or fried foods are harder to digest and may make you feel nauseous or bloated. Drink plenty of fluids but you should avoid alcoholic beverages for 24 hours. If you had a esophageal dilation, please see attached instructions for diet.    ACTIVITY: Your care partner should take you home directly after the procedure. You should plan to take it easy, moving slowly for the rest of the day. You can resume normal activity the day after the procedure however YOU SHOULD NOT DRIVE, use power tools, machinery or perform tasks that involve climbing or major physical exertion for 24 hours (because of the sedation medicines used during the test).   SYMPTOMS TO REPORT IMMEDIATELY: A gastroenterologist can be reached at any hour.  Please call [phone]  for any of the following symptoms:  Following lower endoscopy (colonoscopy, flexible sigmoidoscopy) Excessive amounts of blood in the stool  Significant tenderness, worsening of abdominal pains  Swelling of the abdomen that is new, acute  Fever of 100 or higher    FOLLOW UP:  If any biopsies were taken you will be contacted by phone or by letter within the next 1-3 weeks. Call [phone]  if you have not heard about the biopsies in 3 weeks.  Please also call with any specific questions about appointments or follow up tests.

## 2017-05-19 NOTE — Progress Notes (Signed)
A/ox3 pleased with MAC, report to Megan RN 

## 2017-05-19 NOTE — Progress Notes (Signed)
Called to room to assist during endoscopic procedure.  Patient ID and intended procedure confirmed with present staff. Received instructions for my participation in the procedure from the performing physician.Called to room to assist during endoscopic procedure.

## 2017-05-20 ENCOUNTER — Telehealth: Payer: Self-pay | Admitting: *Deleted

## 2017-05-20 ENCOUNTER — Telehealth: Payer: Self-pay

## 2017-05-20 ENCOUNTER — Encounter: Payer: Self-pay | Admitting: Internal Medicine

## 2017-05-20 NOTE — Telephone Encounter (Signed)
  Follow up Call-  Call back number 05/19/2017  Post procedure Call Back phone  # [phone]  Permission to leave phone message Yes  Some recent data might be hidden     Patient questions:  Do you have a fever, pain , or abdominal swelling? No. Pain Score  0 *  Have you tolerated food without any problems? Yes.    Have you been able to return to your normal activities? Yes.    Do you have any questions about your discharge instructions: Diet   No. Medications  No. Follow up visit  No.  Do you have questions or concerns about your Care? No.  Actions: * If pain score is 4 or above: No action needed, pain <4.

## 2017-05-20 NOTE — Telephone Encounter (Signed)
NO answer after post procedure call back will attempt to call back later this afternoon. Sm

## 2017-05-21 NOTE — Telephone Encounter (Signed)
OK to refill Lomotil as written previously w/ 2 RF

## 2017-05-22 ENCOUNTER — Other Ambulatory Visit: Payer: Self-pay | Admitting: Internal Medicine

## 2017-05-24 ENCOUNTER — Other Ambulatory Visit: Payer: Self-pay | Admitting: Internal Medicine

## 2017-05-25 ENCOUNTER — Other Ambulatory Visit: Payer: Self-pay | Admitting: Internal Medicine

## 2017-05-25 ENCOUNTER — Encounter: Payer: Self-pay | Admitting: Internal Medicine

## 2017-05-25 MED ORDER — ALPRAZOLAM ER 3 MG PO TB24: 3.0000 mg | ORAL_TABLET | Freq: Every morning | ORAL | 5 refills | Status: DC

## 2017-05-25 MED ORDER — ZOLPIDEM TARTRATE 10 MG PO TABS: 10.0000 mg | ORAL_TABLET | Freq: Every evening | ORAL | 5 refills | Status: DC | PRN

## 2017-05-25 NOTE — Progress Notes (Signed)
+   inflammation 10 year colon recall please  My Chart note to patient

## 2017-05-25 NOTE — Telephone Encounter (Signed)
Pt replied back (see below) closing encounter.../lmb  " thank you. I didn't need the 2. Not was asking about them!!I was aware they have refills. And had no issues with that. Please have a great day. And sorry for any confusion"

## 2017-05-25 NOTE — Telephone Encounter (Signed)
Sent pt msg via mychart request.../lmb

## 2017-05-25 NOTE — Telephone Encounter (Signed)
Ok for [employer] and xanax XR 3 mg - done erx  But xanax 2 mg clearly too soon as 3 mo total refills were given oct 3; I will not early refill

## 2017-05-27 ENCOUNTER — Encounter: Payer: BLUE CROSS/BLUE SHIELD | Admitting: Internal Medicine

## 2017-06-02 ENCOUNTER — Other Ambulatory Visit: Payer: Self-pay | Admitting: Internal Medicine

## 2017-07-16 ENCOUNTER — Other Ambulatory Visit: Payer: Self-pay | Admitting: Internal Medicine

## 2017-07-16 NOTE — Telephone Encounter (Signed)
Done erx 

## 2017-07-23 ENCOUNTER — Encounter: Payer: Self-pay | Admitting: Internal Medicine

## 2017-07-23 MED ORDER — METRONIDAZOLE 500 MG PO TABS: 500.0000 mg | ORAL_TABLET | Freq: Three times a day (TID) | ORAL | 0 refills | Status: AC

## 2017-07-23 MED ORDER — CIPROFLOXACIN HCL 500 MG PO TABS: 500.0000 mg | ORAL_TABLET | Freq: Two times a day (BID) | ORAL | 0 refills | Status: AC

## 2017-07-23 NOTE — Telephone Encounter (Signed)
Spoke to him  [name] [name] 1 week ago and profuse diarrhea since Crampy abdominal pain using Lomotil 1 qid  Advised to take the Humira but am adding antibiotics cipro and metronidazole x 1 week  If not better over weekend or worse go to ED for evaluation

## 2017-08-03 ENCOUNTER — Telehealth: Payer: Self-pay

## 2017-08-03 ENCOUNTER — Encounter: Payer: Self-pay | Admitting: Internal Medicine

## 2017-08-03 DIAGNOSIS — R197 Diarrhea, unspecified: Secondary | ICD-10-CM

## 2017-08-03 DIAGNOSIS — K50119 Crohn's disease of large intestine with unspecified complications: Secondary | ICD-10-CM

## 2017-08-03 DIAGNOSIS — R1033 Periumbilical pain: Secondary | ICD-10-CM

## 2017-08-03 MED ORDER — CIPROFLOXACIN HCL 500 MG PO TABS: 500.0000 mg | ORAL_TABLET | Freq: Two times a day (BID) | ORAL | 0 refills | Status: DC

## 2017-08-03 MED ORDER — METRONIDAZOLE 500 MG PO TABS: 500.0000 mg | ORAL_TABLET | Freq: Three times a day (TID) | ORAL | 0 refills | Status: DC

## 2017-08-03 NOTE — Telephone Encounter (Signed)
-----   Message from Gatha Mayer, MD sent at 08/03/2017 11:49 AM EST ----- Regarding: next steps I told him we would do a CT enterography to evaluate for active Crohn's in small bowel Please do this dx is Crohn's of small intestine, periumbilical abdominal pain and diarrhea  Since he felt better on the antibiotics reorder the same things - cipro/flagyl as I did before

## 2017-08-03 NOTE — Telephone Encounter (Signed)
See mychart message that was sent.

## 2017-08-05 ENCOUNTER — Telehealth: Payer: Self-pay | Admitting: Internal Medicine

## 2017-08-05 MED ORDER — ADALIMUMAB 40 MG/0.8ML ~~LOC~~ AJKT: 40.0000 mg | AUTO-INJECTOR | SUBCUTANEOUS | 6 refills | Status: DC

## 2017-08-05 NOTE — Telephone Encounter (Signed)
Patient has changed specialty pharmacies to [facility].  They have not sent him out his Humira.  New Rx sent .  He will call me back if he does not have the drug in the next few days,.

## 2017-08-09 ENCOUNTER — Ambulatory Visit (INDEPENDENT_AMBULATORY_CARE_PROVIDER_SITE_OTHER)
Admission: RE | Admit: 2017-08-09 | Discharge: 2017-08-09 | Disposition: A | Discharge status: Home / Self Care | Payer: BLUE CROSS/BLUE SHIELD | Source: Ambulatory Visit | Attending: Internal Medicine | Admitting: Internal Medicine

## 2017-08-09 DIAGNOSIS — R1033 Periumbilical pain: Secondary | ICD-10-CM | POA: Diagnosis not present

## 2017-08-09 DIAGNOSIS — K50919 Crohn's disease, unspecified, with unspecified complications: Secondary | ICD-10-CM | POA: Diagnosis not present

## 2017-08-09 DIAGNOSIS — R197 Diarrhea, unspecified: Secondary | ICD-10-CM

## 2017-08-09 DIAGNOSIS — K50119 Crohn's disease of large intestine with unspecified complications: Secondary | ICD-10-CM | POA: Diagnosis not present

## 2017-08-09 MED ORDER — IOPAMIDOL (ISOVUE-300) INJECTION 61%
100.0000 mL | Freq: Once | INTRAVENOUS | Status: AC | PRN
  Administered 2017-08-09: 100 mL via INTRAVENOUS

## 2017-08-09 MED ORDER — IOPAMIDOL (ISOVUE-300) INJECTION 61%: 100.0000 mL | Freq: Once | INTRAVENOUS | Status: DC | PRN

## 2017-08-10 ENCOUNTER — Telehealth: Payer: Self-pay | Admitting: Internal Medicine

## 2017-08-10 MED ORDER — ADALIMUMAB 40 MG/0.8ML ~~LOC~~ AJKT: 40.0000 mg | AUTO-INJECTOR | SUBCUTANEOUS | 1 refills | Status: DC

## 2017-08-10 NOTE — Telephone Encounter (Signed)
CT suggests some ongoing Crohn's activity  Based upon this and prior levels of Humira I think it would be good for him to take the Humira weekly and see me in 3 months   Can we get that change approved?

## 2017-08-10 NOTE — Telephone Encounter (Signed)
Please review CT and advise I sent 90 day supple of Humira

## 2017-08-10 NOTE — Telephone Encounter (Signed)
Patient wanting his CT results from yesterday 2.18.19 and wanting to know if he can get his medication humira in a 3 month supply.

## 2017-08-11 MED ORDER — ADALIMUMAB 40 MG/0.8ML ~~LOC~~ AJKT: 40.0000 mg | AUTO-INJECTOR | SUBCUTANEOUS | 1 refills | Status: DC

## 2017-08-11 NOTE — Telephone Encounter (Signed)
He should be afebrile and feeling better x 24-48 hrs before taking it.  He can message me about this also.  Have him see [place] in May also, sooner prn

## 2017-08-11 NOTE — Telephone Encounter (Signed)
Patient notified.  Rx sent in for weekly He was due his Humira last week, but has been working with an new pharmacy and it wil be here today.  He reports vomiting and fever since 1 am.  He feels he may have the flu.  He is advised to hold Humira.  How long should he hold??

## 2017-08-11 NOTE — Telephone Encounter (Signed)
Patient notified  Follow up arranged for may 7

## 2017-08-11 NOTE — Telephone Encounter (Signed)
rx sent for weekly Left message for patient to call back

## 2017-08-19 ENCOUNTER — Emergency Department (HOSPITAL_COMMUNITY): Payer: BLUE CROSS/BLUE SHIELD

## 2017-08-19 ENCOUNTER — Encounter (HOSPITAL_COMMUNITY): Payer: Self-pay | Admitting: *Deleted

## 2017-08-19 DIAGNOSIS — J209 Acute bronchitis, unspecified: Secondary | ICD-10-CM | POA: Insufficient documentation

## 2017-08-19 DIAGNOSIS — Z79899 Other long term (current) drug therapy: Secondary | ICD-10-CM | POA: Diagnosis not present

## 2017-08-19 DIAGNOSIS — R05 Cough: Secondary | ICD-10-CM | POA: Diagnosis not present

## 2017-08-19 DIAGNOSIS — R1110 Vomiting, unspecified: Secondary | ICD-10-CM | POA: Diagnosis not present

## 2017-08-19 DIAGNOSIS — I10 Essential (primary) hypertension: Secondary | ICD-10-CM | POA: Diagnosis not present

## 2017-08-19 DIAGNOSIS — R079 Chest pain, unspecified: Secondary | ICD-10-CM | POA: Diagnosis not present

## 2017-08-19 MED ORDER — ALBUTEROL SULFATE (2.5 MG/3ML) 0.083% IN NEBU
5.0000 mg | INHALATION_SOLUTION | Freq: Once | RESPIRATORY_TRACT | Status: AC
  Administered 2017-08-19: 5 mg via RESPIRATORY_TRACT
  Filled 2017-08-19: qty 6

## 2017-08-19 NOTE — ED Notes (Signed)
Patient transported to X-ray 

## 2017-08-19 NOTE — ED Triage Notes (Signed)
Pt reports he has been coughing/congestion since last week. States he feels a heaviness in his center chest since Monday, has been constant.  Pt says when he takes a deep breath is worsens his cough. Shallow breaths in triage, mild exp wheeze on the right. Denies recent fever. Took a xanax around 1600

## 2017-08-20 ENCOUNTER — Emergency Department (HOSPITAL_COMMUNITY)
Admission: EM | Admit: 2017-08-20 | Discharge: 2017-08-20 | Disposition: A | Discharge status: Home / Self Care | Payer: BLUE CROSS/BLUE SHIELD | Attending: Emergency Medicine | Admitting: Emergency Medicine

## 2017-08-20 DIAGNOSIS — J209 Acute bronchitis, unspecified: Secondary | ICD-10-CM | POA: Diagnosis not present

## 2017-08-20 DIAGNOSIS — R1110 Vomiting, unspecified: Secondary | ICD-10-CM | POA: Diagnosis not present

## 2017-08-20 DIAGNOSIS — R079 Chest pain, unspecified: Secondary | ICD-10-CM | POA: Diagnosis not present

## 2017-08-20 DIAGNOSIS — R05 Cough: Secondary | ICD-10-CM | POA: Diagnosis not present

## 2017-08-20 MED ORDER — ALBUTEROL SULFATE HFA 108 (90 BASE) MCG/ACT IN AERS
2.0000 | INHALATION_SPRAY | RESPIRATORY_TRACT | Status: DC | PRN
Start: 2017-08-20 — End: 2017-08-20
  Administered 2017-08-20: 2 via RESPIRATORY_TRACT
  Filled 2017-08-20: qty 6.7

## 2017-08-20 MED ORDER — HYDROCOD POLST-CPM POLST ER 10-8 MG/5ML PO SUER: 5.0000 mL | Freq: Every evening | ORAL | 0 refills | Status: DC | PRN

## 2017-08-20 NOTE — Discharge Instructions (Signed)
Use Tussionex at night for cough and the inhaler during the day. Ibuprofen for any aches and if you develop a fever. Follow up with your doctor for recheck next week if symptoms persist. Return to the emergency department with any worsening symptoms, or new concern.

## 2017-08-20 NOTE — ED Notes (Signed)
Assumed care of pt CXR reviewed possible bronchitis

## 2017-08-20 NOTE — ED Provider Notes (Signed)
[facility] DEPT Provider Note   CSN: [id number] Arrival date & time: 08/19/17  1911     History   Chief Complaint Chief Complaint  Patient presents with  . Cough    HPI [name] [name] is a 42 y.o. male.  Patient here for evaluation of persistent cough. He reports flu-like illness last week that improved but cough is persistent. He had one episode of vomiting but also reports a crohn's flare currently and is unsure whether the vomiting is related to cough or his crohn's disease. No fever. He has been using Mucinex without improvement.    The history is provided by the patient. No language interpreter was used.  Cough  Associated symptoms include chest pain (associated with cough). Pertinent negatives include no chills, no sore throat, no myalgias and no shortness of breath.    Past Medical History:  Diagnosis Date  . Allergy   . Anal fissure - posterior 02/15/2014  . Anxiety   . Chronic streptococcal tonsillitis    1-2 times annually for most of his life Archie Endo 09/26/2015  . CMV (cytomegalovirus infection) ([place]) 10/28/2010  . Crohn's disease ([place])   . Depression   . Essential hypertension, benign   . Fistula, perirectal   . Headache    history of but no issues currently  . Herpes zoster infection 07/2010, 12/2010   with post-herpetic neuralgia  . Hyperlipidemia   . Nephrolithiasis   . Obesity, unspecified   . Obstructive sleep apnea (adult) (pediatric)    resolved since tonsils removed   . Plantar fasciitis of right foot 08/22/2013  . Pyelonephritis 2015  . Sinus infection 08/05/11  . Syncope   . Tinea pedis 06/27/2013  . Vitamin D deficiency     Patient Active Problem List   Diagnosis Date Noted  . Wellness examination 05/10/2017  . High risk sexual behavior 06/30/2016  . Folliculitis 23/55/7322  . Ganglion cyst of flexor tendon sheath 11/02/2014  . Tinea pedis 06/27/2013  . Immunosuppression ([place]) 10/14/2010  . Obesity, unspecified  07/20/2007  . Anxiety with depression 07/20/2007  . Insomnia 07/20/2007  . Essential hypertension, benign 07/20/2007  . CROHN'S DISEASE, LARGE AND SMALL INTESTINES 07/20/2007    Past Surgical History:  Procedure Laterality Date  . COLON SURGERY  2009  . COLONOSCOPY  10/14/2007   crohn's colitis, aphthous ulcers, mild anal stenosis  . GANGLION CYST EXCISION Right 2016  . HYPOSPADIAS CORRECTION     Childhood   . ILEOCECETOMY  08/05/07   1 ft removed, ileo-cecectomy  . TONSILLECTOMY Bilateral 09/30/2015   Procedure: BILATERAL TONSILLECTOMY;  Surgeon: Izora Gala, MD;  Location: [facility];  Service: ENT;  Laterality: Bilateral;  . TOOTH EXTRACTION    . UPPER GASTROINTESTINAL ENDOSCOPY  10/15/2010   w/biopsy, mild gastritis and duodenitis       Home Medications    Prior to Admission medications   Medication Sig Start Date End Date Taking? Authorizing Provider  acetaminophen (TYLENOL) 325 MG tablet Take 650 mg by mouth every 6 (six) hours as needed for moderate pain.   Yes [provider]  acidophilus (RISAQUAD) CAPS capsule Take 1 capsule by mouth daily.   Yes [provider]  Adalimumab (HUMIRA PEN) 40 MG/0.8ML PNKT Inject 40 mg into the skin once a week. 08/11/17  Yes Gatha Mayer, MD  ALPRAZolam (XANAX XR) 3 MG 24 hr tablet Take 1 tablet (3 mg total) by mouth every morning. 05/25/17  Yes Biagio Borg, MD  alprazolam (  XANAX) 2 MG tablet TAKE 1/2 TABLET BY MOUTH TWICE A DAY AS NEEDED Patient taking differently: TAKE 1/2 TABLET BY MOUTH TWICE A DAY AS NEEDED for anxiety 03/24/17  Yes Biagio Borg, MD  colestipol (COLESTID) 1 g tablet Take 5 tablets (5 g total) by mouth daily. 06/30/16  Yes Gatha Mayer, MD  diphenoxylate-atropine (LOMOTIL) 2.5-0.025 MG tablet TAKE 1 TABLET BY MOUTH 4 TIMES A DAY AS NEEDED FOR DIARRHEA OR LOOSE STOOLS Patient taking differently: TAKE 1 TABLET BY MOUTH 4 TIMES A DAY AS NEEDED FOR DIARRHEA OR LOOSE STOOLS- takes one a day 04/21/17  Yes  Gatha Mayer, MD  DULoxetine (CYMBALTA) 60 MG capsule TAKE ONE CAPSULE BY MOUTH DAILY.TO START AFTER THE WEEK OF 30MG 07/16/17  Yes Biagio Borg, MD  emtricitabine-tenofovir (TRUVADA) 200-300 MG tablet Take 1 tablet by mouth daily. 04/02/17  Yes Gatha Mayer, MD  lisinopril-hydrochlorothiazide [facility]) 20-25 MG tablet Take 1 tablet daily by mouth. 05/10/17  Yes Biagio Borg, MD  loratadine (CLARITIN) 10 MG tablet Take 10 mg by mouth daily.   Yes [provider]  ondansetron (ZOFRAN) 4 MG tablet TAKE 1 TAB BY MOUTH EVERY 6 HOURS 02/04/17  Yes Gatha Mayer, MD  zolpidem (AMBIEN) 10 MG tablet Take 1 tablet (10 mg total) by mouth at bedtime as needed. for sleep - to fill May 04, 2017 05/25/17  Yes Biagio Borg, MD  chlorpheniramine-HYDROcodone [facility] ER) 10-8 MG/5ML SUER Take 5 mLs by mouth at bedtime as needed for cough. 08/20/17   Charlann Lange, [name]  ciprofloxacin (CIPRO) 500 MG tablet Take 1 tablet (500 mg total) by mouth 2 (two) times daily. Patient not taking: Reported on 08/20/2017 08/03/17   Gatha Mayer, MD  metroNIDAZOLE (FLAGYL) 500 MG tablet Take 1 tablet (500 mg total) by mouth 3 (three) times daily. Patient not taking: Reported on 08/20/2017 08/03/17   Gatha Mayer, MD    Family History Family History  Problem Relation Age of Onset  . Heart disease Father        CAD/MI  . Hyperlipidemia Father   . Hypertension Father   . Gout Father   . Sleep apnea Father   . Obesity Mother   . Allergies Mother   . Cancer Mother   . Stroke Maternal Grandfather   . Allergies Sister   . Asthma Sister   . Colon cancer Neg Hx   . Diabetes Neg Hx   . COPD Neg Hx   . Colon polyps Neg Hx     Social History Social History   Tobacco Use  . Smoking status: Never Smoker  . Smokeless tobacco: Never Used  Substance Use Topics  . Alcohol use: Yes    Alcohol/week: 1.8 oz    Types: 3 Shots of liquor per week  . Drug use: No     Allergies     Patient has no known allergies.   Review of Systems Review of Systems  Constitutional: Negative for chills and fever.  HENT: Positive for congestion. Negative for sore throat.   Respiratory: Positive for cough. Negative for shortness of breath.   Cardiovascular: Positive for chest pain (associated with cough).       Chest pain with cough.  Gastrointestinal: Positive for vomiting.  Musculoskeletal: Negative.  Negative for myalgias.  Skin: Negative.   Neurological: Negative.      Physical Exam Updated Vital Signs BP 115/77 (BP Location: Left Arm)   Pulse 67  Temp 98.3 F (36.8 C) (Oral)   Resp 18   Ht 5' 9"  (1.753 m)   Wt 104.3 kg (230 lb)   SpO2 99%   BMI 33.97 kg/m   Physical Exam  Constitutional: He is oriented to person, place, and time. He appears well-developed and well-nourished.  HENT:  Head: Normocephalic.  Neck: Normal range of motion. Neck supple.  Cardiovascular: Normal rate and regular rhythm.  No murmur heard. Pulmonary/Chest: Effort normal and breath sounds normal. He has no wheezes. He has no rales. He exhibits no tenderness.  Abdominal: Soft. Bowel sounds are normal. There is no tenderness. There is no rebound and no guarding.  Musculoskeletal: Normal range of motion.  Neurological: He is alert and oriented to person, place, and time.  Skin: Skin is warm and dry. No rash noted.  Psychiatric: He has a normal mood and affect.     ED Treatments / Results  Labs (all labs ordered are listed, but only abnormal results are displayed) Labs Reviewed - No data to display  EKG  EKG Interpretation  Date/Time:  Thursday August 19 2017 19:43:54 EST Ventricular Rate:  71 PR Interval:    QRS Duration: 88 QT Interval:  386 QTC Calculation: 420 R Axis:   79 Text Interpretation:  Sinus rhythm Normal ECG No significant change since last tracing 25 Sep 2015 Confirmed by Rolland Porter [phone]) on 08/20/2017 3:54:32 AM       Radiology Dg Chest 2  View  Result Date: 08/19/2017 CLINICAL DATA:  Cough EXAM: CHEST  2 VIEW COMPARISON:  05/11/2015 FINDINGS: Mild peribronchial thickening. Heart and mediastinal contours are within normal limits. No focal opacities or effusions. No acute bony abnormality. IMPRESSION: Mild bronchitic changes. Electronically Signed   By: Rolm Baptise M.D.   On: 08/19/2017 19:54    Procedures Procedures (including critical care time)  Medications Ordered in ED Medications  albuterol (PROVENTIL HFA;VENTOLIN HFA) 108 (90 Base) MCG/ACT inhaler 2 puff (not administered)  albuterol (PROVENTIL) (2.5 MG/3ML) 0.083% nebulizer solution 5 mg (5 mg Nebulization Given 08/19/17 1947)     Initial Impression / Assessment and Plan / ED Course  I have reviewed the triage vital signs and the nursing notes.  Pertinent labs & imaging results that were available during my care of the patient were reviewed by me and considered in my medical decision making (see chart for details).     Patient here for evaluation of cough, persistent x 1 week. Worse illness last week he thought was flu. No fever.   CXR shows bronchitis. Discussed symptomatic treatment. Will prescribe Tussionex for night time use. He is provided an inhaler for day time use.   Encouraged PCP follow up for recheck .   Final Clinical Impressions(s) / ED Diagnoses   Final diagnoses:  Acute bronchitis, unspecified organism    ED Discharge Orders        Ordered    chlorpheniramine-HYDROcodone (TUSSIONEX PENNKINETIC ER) 10-8 MG/5ML SUER  At bedtime PRN     08/20/17 0419       Charlann Lange, [name] 08/20/17 0429    Rolland Porter, MD 08/20/17 [phone]

## 2017-09-20 ENCOUNTER — Other Ambulatory Visit: Payer: Self-pay | Admitting: Internal Medicine

## 2017-09-20 NOTE — Telephone Encounter (Signed)
Done erx 

## 2017-09-20 NOTE — Telephone Encounter (Signed)
Last filled on 08/21/2017

## 2017-10-13 ENCOUNTER — Other Ambulatory Visit: Payer: Self-pay | Admitting: Internal Medicine

## 2017-10-20 ENCOUNTER — Other Ambulatory Visit: Payer: Self-pay | Admitting: Internal Medicine

## 2017-10-20 NOTE — Telephone Encounter (Signed)
[name] [name] has appointment next Tues with [place]. May I refill this Sir?

## 2017-10-21 NOTE — Telephone Encounter (Signed)
Refill x 1 ok

## 2017-10-26 ENCOUNTER — Ambulatory Visit: Payer: BLUE CROSS/BLUE SHIELD | Admitting: Internal Medicine

## 2017-10-26 ENCOUNTER — Encounter: Payer: Self-pay | Admitting: Internal Medicine

## 2017-10-26 VITALS — BP 116/78 | HR 82 | Ht 69.0 in | Wt 236.2 lb

## 2017-10-26 DIAGNOSIS — K601 Chronic anal fissure: Secondary | ICD-10-CM | POA: Diagnosis not present

## 2017-10-26 DIAGNOSIS — D899 Disorder involving the immune mechanism, unspecified: Secondary | ICD-10-CM

## 2017-10-26 DIAGNOSIS — K50818 Crohn's disease of both small and large intestine with other complication: Secondary | ICD-10-CM

## 2017-10-26 DIAGNOSIS — D849 Immunodeficiency, unspecified: Secondary | ICD-10-CM

## 2017-10-26 DIAGNOSIS — L309 Dermatitis, unspecified: Secondary | ICD-10-CM

## 2017-10-26 MED ORDER — AMBULATORY NON FORMULARY MEDICATION: 3 refills | Status: DC

## 2017-10-26 MED ORDER — NYSTATIN-TRIAMCINOLONE 100000-0.1 UNIT/GM-% EX OINT: 1.0000 "application " | TOPICAL_OINTMENT | Freq: Two times a day (BID) | CUTANEOUS | 1 refills | Status: DC

## 2017-10-26 NOTE — Assessment & Plan Note (Signed)
I explained the nature of anal fissure.  He has had this in the past but he did not recall. Need to reduce diarrhea.  Humira treatment and using colestipol is important.  He may use Lomotil also. Sits baths are fine. Diltiazem 2% lidocaine 5% combination cream at least twice daily inserted into the anal canal. Return to me in July.  He is to update me in a couple of weeks about the fissure and his perianal dermatitis however he could need to see a colorectal surgeon or general surgeon.

## 2017-10-26 NOTE — Patient Instructions (Addendum)
  We will have the nurses work on getting you the new formula of Humira.  We are giving you a anal fissure handout to read.  We have sent an rx to [facility] for a rectal cream for you to pick up. That should be ready tomorrow.  Take your colestid regularly.  Purchase over the counter Zeasorb to help the rash.  Let us know in 2 weeks how your doing.  We have sent the following medications to your pharmacy for you to pick up at your convenience: Nystatin oint.  We will see you back 12/20/17 at 9:00AM.  I appreciate the opportunity to care for you. Silvano Rusk, MD, [facility]

## 2017-10-26 NOTE — Progress Notes (Signed)
[name] 42 y.o. 02-Sep-1975 [id number]  Assessment & Plan:   Encounter Diagnoses  Name Primary?  . Crohn's disease of both small and large intestine with other complication ([place]) Yes  . Chronic posterior anal fissure   . Perianal dermatitis   . Immunosuppression ([place])     CROHN'S DISEASE, LARGE AND SMALL INTESTINES RTC July If weekly Humira not working then may need med change, consider levels Go to citrate free preparation Still wonder how much of his symptoms are bile salt malabsorption he is not completely compliant with that medication, may consider capsule endoscopy though I have some concerns about passage through the ileocolonic anastomosis, another thing to consider would be balloon dilation of the ileocolonic anastomosis  Chronic posterior anal fissure I explained the nature of anal fissure.  He has had this in the past but he did not recall. Need to reduce diarrhea.  Humira treatment and using colestipol is important.  He may use Lomotil also. Sits baths are fine. Diltiazem 2% lidocaine 5% combination cream at least twice daily inserted into the anal canal. Return to me in July.  He is to update me in a couple of weeks about the fissure and his perianal dermatitis however he could need to see a colorectal surgeon or general surgeon.  Perianal dermatitis I think the changes in his perineal area are probably related to wiping and or more irritation than anything else though infection or some sort of preneoplastic lesion within the realm of possibility though I think very unlikely.  I have explained this to [name].  I plan to treat with nystatin/triamcinolone ointment twice daily and he should use Zeasorb AF powder and try to keep this area dry and not be as aggressive with wiping.  He will continue other anal care with a hair dryer.  He is to update me in 2 weeks with a message as to how he is doing.  If there are persistent issues are either bring him into be seen  before July or ask him to see a surgeon in case biopsy is necessary.  Immunosuppression No problems that I can see from Humira treatment at this time other than localized injection symptoms which we will hopefully eliminate with changing to the citrate free medication  I appreciate the opportunity to care for this patient. CC: [name] Borg, MD    Subjective:   Chief Complaint: Follow-up of Crohn's diarrhea rectal abnormality  HPI [name] [name] is here for follow-up, he was last seen at colonoscopy in late November 2018 where he had stenosis or mild stricturing of the ileocolonic anastomosis with inflammatory changes.  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  Subsequently he had a low trough level of Humira, he tried colestipol and it was not particularly helpful though compliance might not of been 100% and is still not.  A course of Cipro and metronidazole was also tried.  He is using about 1 Lomotil a day.   CT enterography was done in mid February and showed   1. No overt mucosal or transmural hyperenhancement in the neo terminal ileum or ileocolic anastomosis. There is no associated mural stratification, mural edema or mesenteric vascular congestion. No inflammatory or fibro stenotic small bowel stricture. 2. Subtle mucosal hyperenhancement in the sigmoid colon and rectum with subtle mural stratification. No overt mural edema in the sigmoid colon and rectum. No substantial edema/inflammation in the adjacent fat. Given mucosal hyperenhancement, a component of  active inflammation in the sigmoid colon and rectum is a possibility by CT. 3.  Aortic Atherosclerois (ICD10-170.0)  At that time since he had a colonoscopy a few months before with no colonic inflammation I thought that that was probably artifact in the colon but thought we needed to consider increasing his medication given the low trough levels.  Humira was changed to  weekly dosing in late February probably early early March and he feels about the same.  He is not taking his colestipol much if at all, there is a large number of pills though he cannot tolerate powdered formulations of bile sequestrants.  He would like citrate free Humira because of the burning.  He is complaining of 3-5 loose bowel movements a day, sometimes at night but not predominantly, mostly in the morning.  He also sweats a lot and feels moist in the perianal and perineal areas, and he wonders if he has a wart or something else there.  It is painful to defecate and he is having some rectal bleeding.  He had a gastroenteritis type illness in February but is been okay since.  There is no active or regular anal receptive intercourse though he may receive that rarely.  He does wipe frequently using wet wipes and will see slight rectal bleeding when he does wipe a bunch.  He uses a hair dryer to dry off after bathing. No Known Allergies Current Meds  Medication Sig  . acetaminophen (TYLENOL) 325 MG tablet Take 650 mg by mouth every 6 (six) hours as needed for moderate pain.  Marland Kitchen acidophilus (RISAQUAD) CAPS capsule Take 1 capsule by mouth daily.  . Adalimumab (HUMIRA PEN) 40 MG/0.8ML PNKT Inject 40 mg into the skin once a week.  . ALPRAZolam (XANAX XR) 3 MG 24 hr tablet Take 1 tablet (3 mg total) by mouth every morning.  Marland Kitchen alprazolam (XANAX) 2 MG tablet TAKE 1/2 TAB BY MOUTH TWICE DAILY AS NEEDED.  . chlorpheniramine-HYDROcodone (TUSSIONEX PENNKINETIC ER) 10-8 MG/5ML SUER Take 5 mLs by mouth at bedtime as needed for cough.  . colestipol (COLESTID) 1 g tablet Take 5 tablets (5 g total) by mouth daily.  . diphenoxylate-atropine (LOMOTIL) 2.5-0.025 MG tablet TAKE 1 TABLET BY MOUTH FOUR TIMES A DAY AS NEEDED FOR DIARRHEA / LOOSE STOOLS  . DULoxetine (CYMBALTA) 60 MG capsule TAKE 1 CAPSULE BY MOUTH ONCE DAILY  . emtricitabine-tenofovir (TRUVADA) 200-300 MG tablet Take 1 tablet by mouth daily.  Marland Kitchen  lisinopril-hydrochlorothiazide (PRINZIDE,ZESTORETIC) 20-25 MG tablet Take 1 tablet daily by mouth.  . loratadine (CLARITIN) 10 MG tablet Take 10 mg by mouth daily.  . ondansetron (ZOFRAN) 4 MG tablet TAKE 1 TAB BY MOUTH EVERY 6 HOURS  . zolpidem (AMBIEN) 10 MG tablet Take 1 tablet (10 mg total) by mouth at bedtime as needed. for sleep - to fill May 04, 2017   Past Medical History:  Diagnosis Date  . Allergy   . Anal fissure - posterior 02/15/2014  . Anxiety   . Chronic streptococcal tonsillitis    1-2 times annually for most of his life Archie Endo 09/26/2015  . CMV (cytomegalovirus infection) ([place]) 10/28/2010  . Crohn's disease ([place])   . Depression   . Essential hypertension, benign   . Fistula, perirectal   . Headache    history of but no issues currently  . Herpes zoster infection 07/2010, 12/2010   with post-herpetic neuralgia  . Hyperlipidemia   . Nephrolithiasis   . Obesity, unspecified   . Obstructive sleep apnea (  adult) (pediatric)    resolved since tonsils removed   . Plantar fasciitis of right foot 08/22/2013  . Pyelonephritis 2015  . Sinus infection 08/05/11  . Syncope   . Tinea pedis 06/27/2013  . Vitamin D deficiency    Past Surgical History:  Procedure Laterality Date  . COLON SURGERY  2009  . COLONOSCOPY  10/14/2007   crohn's colitis, aphthous ulcers, mild anal stenosis  . GANGLION CYST EXCISION Right 2016  . HYPOSPADIAS CORRECTION     Childhood   . ILEOCECETOMY  08/05/07   1 ft removed, ileo-cecectomy  . TONSILLECTOMY Bilateral 09/30/2015   Procedure: BILATERAL TONSILLECTOMY;  Surgeon: Izora Gala, MD;  Location: [facility];  Service: ENT;  Laterality: Bilateral;  . TOOTH EXTRACTION    . UPPER GASTROINTESTINAL ENDOSCOPY  10/15/2010   w/biopsy, mild gastritis and duodenitis   Social History   Social History Narrative   HSG, [place] - 2 years. Married '99 - 46yr/divorced. 2 sons - '98, '00 split custody. 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 No  history of physical or sexual abuse.    [employer]   family history includes Allergies in his mother and sister; Asthma in his sister; Cancer in his mother; Gout in his father; Heart disease in his father; Hyperlipidemia in his father; Hypertension in his father; Obesity in his mother; Sleep apnea in his father; Stroke in his maternal grandfather.   Review of Systems As per HPI  Objective:   Physical Exam BP 116/78   Pulse 82   Ht 5' 9"  (1.753 m)   Wt 236 lb 4 oz (107.2 kg)   BMI 34.89 kg/m  Well-developed well-nourished obese no acute distress Eyes anicteric Lungs clear anterior Heart normal S1-S2 no murmur Abdomen is obese soft nontender no organomegaly or mass or well-healed surgical scars. Skin shows some erythematous changes in the groin area.  Lower abdomen is well.  Rectal exam demonstrates a posterior sentinel pile and changes consistent with a posterior anal fissure with tenderness and induration in the posterior anal canal.  There is no mass.  I do not see any active draining fistula like he has had in the past.  In the perineal area there is a fleshy raised ridge of tissue that is soft, not particularly pathognomonic for anything that looks like edematous tissue.  It is erythematous.  Mildly so.  Data reviewed as per HPI.

## 2017-10-26 NOTE — Assessment & Plan Note (Signed)
No problems that I can see from Humira treatment at this time other than localized injection symptoms which we will hopefully eliminate with changing to the citrate free medication

## 2017-10-26 NOTE — Assessment & Plan Note (Addendum)
RTC July If weekly Humira not working then may need med change, consider levels Go to citrate free preparation Still wonder how much of his symptoms are bile salt malabsorption he is not completely compliant with that medication, may consider capsule endoscopy though I have some concerns about passage through the ileocolonic anastomosis, another thing to consider would be balloon dilation of the ileocolonic anastomosis

## 2017-10-26 NOTE — Assessment & Plan Note (Signed)
I think the changes in his perineal area are probably related to wiping and or more irritation than anything else though infection or some sort of preneoplastic lesion within the realm of possibility though I think very unlikely.  I have explained this to [place].  I plan to treat with nystatin/triamcinolone ointment twice daily and he should use Zeasorb AF powder and try to keep this area dry and not be as aggressive with wiping.  He will continue other anal care with a hair dryer.  He is to update me in 2 weeks with a message as to how he is doing.  If there are persistent issues are either bring him into be seen before July or ask him to see a surgeon in case biopsy is necessary.

## 2017-10-27 ENCOUNTER — Other Ambulatory Visit: Payer: Self-pay | Admitting: Internal Medicine

## 2017-11-13 ENCOUNTER — Encounter: Payer: Self-pay | Admitting: Family Medicine

## 2017-11-13 ENCOUNTER — Ambulatory Visit: Payer: BLUE CROSS/BLUE SHIELD | Admitting: Family Medicine

## 2017-11-13 VITALS — BP 118/72 | HR 105 | Temp 97.7°F | Resp 18 | Ht 69.0 in | Wt 235.0 lb

## 2017-11-13 DIAGNOSIS — B9689 Other specified bacterial agents as the cause of diseases classified elsewhere: Secondary | ICD-10-CM

## 2017-11-13 DIAGNOSIS — J329 Chronic sinusitis, unspecified: Secondary | ICD-10-CM | POA: Diagnosis not present

## 2017-11-13 DIAGNOSIS — D849 Immunodeficiency, unspecified: Secondary | ICD-10-CM

## 2017-11-13 DIAGNOSIS — D899 Disorder involving the immune mechanism, unspecified: Secondary | ICD-10-CM

## 2017-11-13 MED ORDER — AMOXICILLIN-POT CLAVULANATE 875-125 MG PO TABS: 1.0000 | ORAL_TABLET | Freq: Two times a day (BID) | ORAL | 0 refills | Status: AC

## 2017-11-13 NOTE — Progress Notes (Signed)
PCP: Biagio Borg, MD  Subjective:  [name] [name] is a 42 y.o. year old very pleasant male patient who presents with sinusitis symptoms including nasal congestion, sinus tenderness  3 days of severe symptoms- including severe sinus pressure through maxillary sinuses, Cant breath at all through nose. He is also experincing  Wheezing at times but clears with cough. Runny nose. Coughing. Temp up to 101 at home. Chest wall pain on right side with coughing. Nonproductive cough. No sick contacts. Has history of C. Diff with antibiotics but less likely when uses probiotic.   Higher risk for bacterial infection given Humira risk for Crohn's   ROS-denies SOB, NVD. Has had fever.   Pertinent Past Medical History- Crohn's disease with immunosupression on humira, anxiety, hypertension  Medications- reviewed  Current Outpatient Medications  Medication Sig Dispense Refill  . acetaminophen (TYLENOL) 325 MG tablet Take 650 mg by mouth every 6 (six) hours as needed for moderate pain.    Marland Kitchen acidophilus (RISAQUAD) CAPS capsule Take 1 capsule by mouth daily.    . Adalimumab (HUMIRA PEN) 40 MG/0.8ML PNKT Inject 40 mg into the skin once a week. 12 each 1  . ALPRAZolam (XANAX XR) 3 MG 24 hr tablet Take 1 tablet (3 mg total) by mouth every morning. 30 tablet 5  . alprazolam (XANAX) 2 MG tablet TAKE 1/2 TAB BY MOUTH TWICE DAILY AS NEEDED. 30 tablet 2  . AMBULATORY NON FORMULARY MEDICATION Diltiazem 2% mixed with Lidocaine 5%  Apply pea size amount TID 30 g 3  . chlorpheniramine-HYDROcodone (TUSSIONEX PENNKINETIC ER) 10-8 MG/5ML SUER Take 5 mLs by mouth at bedtime as needed for cough. 50 mL 0  . colestipol (COLESTID) 1 g tablet TAKE 5 TABLETS BY MOUTH DAILY 450 tablet 3  . diphenoxylate-atropine (LOMOTIL) 2.5-0.025 MG tablet TAKE 1 TABLET BY MOUTH FOUR TIMES A DAY AS NEEDED FOR DIARRHEA / LOOSE STOOLS 90 tablet 0  . DULoxetine (CYMBALTA) 60 MG capsule TAKE 1 CAPSULE BY MOUTH ONCE DAILY 90 capsule 0  .  emtricitabine-tenofovir (TRUVADA) 200-300 MG tablet Take 1 tablet by mouth daily. 90 tablet 3  . lisinopril-hydrochlorothiazide (PRINZIDE,ZESTORETIC) 20-25 MG tablet Take 1 tablet daily by mouth. 90 tablet 3  . loratadine (CLARITIN) 10 MG tablet Take 10 mg by mouth daily.    Marland Kitchen nystatin-triamcinolone ointment (MYCOLOG) Apply 1 application topically 2 (two) times daily. 60 g 1  . ondansetron (ZOFRAN) 4 MG tablet TAKE 1 TAB BY MOUTH EVERY 6 HOURS 20 tablet 2  . zolpidem (AMBIEN) 10 MG tablet Take 1 tablet (10 mg total) by mouth at bedtime as needed. for sleep - to fill May 04, 2017 30 tablet 5  . amoxicillin-clavulanate (AUGMENTIN) 875-125 MG tablet Take 1 tablet by mouth 2 (two) times daily for 7 days. 14 tablet 0   No current facility-administered medications for this visit.     Objective: BP 118/72 (BP Location: Left Arm, Patient Position: Sitting, Cuff Size: Large)   Pulse (!) 105   Temp 97.7 F (36.5 C) (Oral)   Resp 18   Ht 5' 9"  (1.753 m)   Wt 235 lb (106.6 kg)   SpO2 96%   BMI 34.70 kg/m  Gen: NAD, appears fatigued HEENT: Turbinates erythematous with yellow/green drainage, TM normal, pharynx mildly erythematous with no tonsilar exudate or edema, Right > left maxillary sinus tenderness CV: RRR no murmurs rubs or gallops Lungs: CTAB no crackles, wheeze, rhonchi Abdomen: soft/nontender/nondistended/normal bowel sounds.  Ext: no edema Skin: warm, dry, no rash  Assessment/Plan:  Sinsusitis Bacterial based on:  severe symptoms in first 3 days. Plus he is higher risk for bacterial infection based on immunosuppression with Humira  Treatment: -considered steroid: we opted out to not further suppress immune system -other symptomatic care with mucinex -Antibiotic indicated: yes. He is going to take probiotic along with this due to history of C. Difficile. Actually looks like he had C. Diff with azithromycin when I saw him January 2016 for sinus infection unfortunately- he says he has  done better with probiotics since that time.   Finally, we reviewed reasons to return to care including if symptoms worsen or persist or new concerns arise (particularly fever or shortness of breath). If patient fails to improve- he should also have updated HIV test - he has been on truvada though and I do not suspect this is acute HIV.   Meds ordered this encounter  Medications  . amoxicillin-clavulanate (AUGMENTIN) 875-125 MG tablet    Sig: Take 1 tablet by mouth 2 (two) times daily for 7 days.    Dispense:  14 tablet    Refill:  0    Garret Reddish, MD

## 2017-11-13 NOTE — Patient Instructions (Signed)
Sinsusitis Bacterial based on:  severe symptoms in first 3 days. Plus he is higher risk for bacterial infection based on immunosuppression with Humira  Treatment: -considered steroid: we opted out to not further suppress immune system -other symptomatic care with mucinex -Antibiotic indicated: yes. He is going to take probiotic along with this due to history of C. Difficile. Actually looks like he had C. Diff with azithromycin when I saw him January 2016 for sinus infection unfortunately- he says he has done better with probiotics since that time.   Finally, we reviewed reasons to return to care including if symptoms worsen or persist or new concerns arise (particularly fever or shortness of breath)  Meds ordered this encounter  Medications  . amoxicillin-clavulanate (AUGMENTIN) 875-125 MG tablet    Sig: Take 1 tablet by mouth 2 (two) times daily for 7 days.    Dispense:  14 tablet    Refill:  0

## 2017-11-16 ENCOUNTER — Telehealth: Payer: Self-pay | Admitting: Internal Medicine

## 2017-11-16 NOTE — Telephone Encounter (Signed)
Patient was triaged by [facility] on 5/25.  States that he was having shortness of breath  But not severe.  States he haas cough and sneezing.  States he did have chest pain but due to coughing.  Patient was advised to go to ED.  Patient was seen on [facility] by [place].

## 2017-11-18 ENCOUNTER — Encounter: Payer: Self-pay | Admitting: Family Medicine

## 2017-11-18 ENCOUNTER — Encounter: Payer: Self-pay | Admitting: Internal Medicine

## 2017-11-19 ENCOUNTER — Other Ambulatory Visit: Payer: Self-pay | Admitting: Internal Medicine

## 2017-11-19 MED ORDER — HYDROCOD POLST-CPM POLST ER 10-8 MG/5ML PO SUER: 5.0000 mL | Freq: Every evening | ORAL | 0 refills | Status: DC | PRN

## 2017-11-19 NOTE — Telephone Encounter (Signed)
Done erx 

## 2017-11-19 NOTE — Telephone Encounter (Signed)
10/20/2017  30# 

## 2017-12-20 ENCOUNTER — Other Ambulatory Visit: Payer: BLUE CROSS/BLUE SHIELD

## 2017-12-20 ENCOUNTER — Other Ambulatory Visit: Payer: Self-pay | Admitting: Internal Medicine

## 2017-12-20 ENCOUNTER — Other Ambulatory Visit: Payer: Self-pay

## 2017-12-20 ENCOUNTER — Encounter: Payer: Self-pay | Admitting: Internal Medicine

## 2017-12-20 ENCOUNTER — Ambulatory Visit (INDEPENDENT_AMBULATORY_CARE_PROVIDER_SITE_OTHER): Payer: BLUE CROSS/BLUE SHIELD | Admitting: Internal Medicine

## 2017-12-20 DIAGNOSIS — Z79899 Other long term (current) drug therapy: Secondary | ICD-10-CM

## 2017-12-20 DIAGNOSIS — K601 Chronic anal fissure: Secondary | ICD-10-CM | POA: Diagnosis not present

## 2017-12-20 DIAGNOSIS — Z7960 Long term (current) use of unspecified immunomodulators and immunosuppressants: Secondary | ICD-10-CM

## 2017-12-20 DIAGNOSIS — K9089 Other intestinal malabsorption: Secondary | ICD-10-CM | POA: Diagnosis not present

## 2017-12-20 DIAGNOSIS — L309 Dermatitis, unspecified: Secondary | ICD-10-CM

## 2017-12-20 DIAGNOSIS — Z23 Encounter for immunization: Secondary | ICD-10-CM

## 2017-12-20 DIAGNOSIS — K50818 Crohn's disease of both small and large intestine with other complication: Secondary | ICD-10-CM

## 2017-12-20 HISTORY — DX: Other intestinal malabsorption: K90.89

## 2017-12-20 MED ORDER — ADALIMUMAB 40 MG/0.4ML ~~LOC~~ AJKT: 40.0000 mg | AUTO-INJECTOR | SUBCUTANEOUS | 6 refills | Status: DC

## 2017-12-20 NOTE — Assessment & Plan Note (Signed)
Improved but persistent sxs Refer to Dr. Marcello Moores re: can he get Botox Continue diltiazem/lidocaine

## 2017-12-20 NOTE — Progress Notes (Signed)
[name] [name] 41 y.o. [birth year] [id number]  Assessment & Plan:  CROHN'S DISEASE, LARGE AND SMALL INTESTINES Improved on colestipol Needs citrate free Humira - continue weekly dosing RTC 6 mos routine  Chronic posterior anal fissure Improved but persistent sxs Refer to Dr. 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 FY:BOFB, Hunt Oris, MD Leighton Ruff, MD    Subjective:   Chief Complaint: f/u Crohn's, diarrhea and anal fissure  HPI He reports improvement with diarrhea as long as he is compliant with colestipol Perianal dermatitis gone and Zeasorb AF helping there and in intertriginous areas Rectal pain and fissure better but not resolved and is largely compliant with Tx -  No Known Allergies Current Meds  Medication Sig  . acetaminophen (TYLENOL) 325 MG tablet Take 650 mg by mouth every 6 (six) hours as needed for moderate pain.  Marland Kitchen acidophilus (RISAQUAD) CAPS capsule Take 1 capsule by mouth daily.  . Adalimumab (HUMIRA PEN) 40 MG/0.8ML PNKT Inject 40 mg into the skin once a week.  . ALPRAZolam (XANAX XR) 3 MG 24 hr tablet TAKE 1 TABLET (3 MG TOTAL) BY MOUTH EVERY MORNING.  Marland Kitchen alprazolam (XANAX) 2 MG tablet TAKE 1/2 TAB BY MOUTH TWICE DAILY AS NEEDED.  Marland Kitchen AMBULATORY NON FORMULARY MEDICATION Diltiazem 2% mixed with Lidocaine 5%  Apply pea size amount TID  . colestipol (COLESTID) 1 g tablet TAKE 5 TABLETS BY MOUTH DAILY  . diphenoxylate-atropine (LOMOTIL) 2.5-0.025 MG tablet TAKE 1 TABLET BY MOUTH FOUR TIMES A DAY AS NEEDED FOR DIARRHEA / LOOSE STOOLS  . DULoxetine (CYMBALTA) 60 MG capsule TAKE 1 CAPSULE BY MOUTH ONCE DAILY  .  emtricitabine-tenofovir (TRUVADA) 200-300 MG tablet Take 1 tablet by mouth daily.  Marland Kitchen lisinopril-hydrochlorothiazide (PRINZIDE,ZESTORETIC) 20-25 MG tablet Take 1 tablet daily by mouth.  . loratadine (CLARITIN) 10 MG tablet Take 10 mg by mouth daily.  Marland Kitchen nystatin-triamcinolone ointment (MYCOLOG) Apply 1 application topically 2 (two) times daily.  . ondansetron (ZOFRAN) 4 MG tablet TAKE 1 TAB BY MOUTH EVERY 6 HOURS  . zolpidem (AMBIEN) 10 MG tablet Take 1 tablet (10 mg total) by mouth at bedtime as needed. for sleep - to fill May 04, 2017   Past Medical History:  Diagnosis Date  . Allergy   . Anal fissure - posterior 02/15/2014  . Anxiety   . 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 Chronic streptococcal tonsillitis    1-2 times annually for most of his life Archie Endo 09/26/2015  . CMV (cytomegalovirus infection) ([place]) 10/28/2010  . Crohn's disease ([place])   . Depression   . Essential hypertension, benign   . Fistula, perirectal   . Headache    history of but no issues currently  . Herpes zoster infection 07/2010, 12/2010   with post-herpetic neuralgia  . Hyperlipidemia   . Nephrolithiasis   . Obesity, unspecified   . Obstructive sleep apnea (adult) (pediatric)    resolved since tonsils removed   . Plantar fasciitis of right foot 08/22/2013  . Pyelonephritis 2015  . Sinus infection 08/05/11  . Syncope   . Tinea pedis 06/27/2013  . Vitamin D deficiency    Past Surgical History:  Procedure Laterality Date  . COLON SURGERY  2009  .  COLONOSCOPY  10/14/2007   crohn's colitis, aphthous ulcers, mild anal stenosis  . GANGLION CYST EXCISION Right 2016  . HYPOSPADIAS CORRECTION     Childhood   . ILEOCECETOMY  08/05/07   1 ft removed, ileo-cecectomy  . TONSILLECTOMY Bilateral 09/30/2015   Procedure: BILATERAL TONSILLECTOMY;  Surgeon: Izora Gala, MD;  Location: [facility];  Service: ENT;  Laterality: Bilateral;  . TOOTH EXTRACTION    . UPPER GASTROINTESTINAL ENDOSCOPY  10/15/2010     w/biopsy, mild gastritis and duodenitis   Social History   Social History Narrative   HSG, [place] - 2 years. Married '99 - 73yr/divorced. 2 sons - '98, '00 split custody. 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 No history of physical or sexual abuse.    [employer]   family history includes Allergies in his mother and sister; Asthma in his sister; Cancer in his mother; Gout in his father; Heart disease in his father; Hyperlipidemia in his father; Hypertension in his father; Obesity in his mother; Sleep apnea in his father; Stroke in his maternal grandfather.   Review of Systems No fevers, has been well otherwise  Objective:   Physical Exam  BP 102/70 (BP Location: Left Arm, Patient Position: Sitting, Cuff Size: Normal)   Pulse 88   Ht 5' 7.25" (1.708 m) Comment: height measured without shoes  Wt 235 lb 4 oz (106.7 kg)   BMI 36.57 kg/m  NAD Obese Eyes are anicteric Rectal inspection - no dermatitis  Tenderness at posterior sentinel pile on external palpation

## 2017-12-20 NOTE — Assessment & Plan Note (Addendum)
Improved on colestipol Needs citrate free Humira - continue weekly dosing RTC 6 mos routine

## 2017-12-20 NOTE — Patient Instructions (Addendum)
  Your provider has requested that you go to the basement level for lab work before leaving today. Press "B" on the elevator. The lab is located at the first door on the left as you exit the elevator.   Today you have been given a Prevnar 13 vaccine and the information sheet.   You have been scheduled for an appointment with Dr Leighton Ruff at [facility]. Your appointment is on _________________ at __________________. Please arrive at __________________ for registration. Make certain to bring a list of current medications, including any over the counter medications or vitamins. Also bring your co-pay if you have one as well as your insurance cards. [facility] is located at [address]. Should you need to reschedule your appointment, please contact them at [phone].     I appreciate the opportunity to care for you. Silvano Rusk, MD, [facility]

## 2017-12-20 NOTE — Assessment & Plan Note (Signed)
Resolved - continue Zeasorb AF and prn antifugal/steroid

## 2017-12-20 NOTE — Assessment & Plan Note (Addendum)
quantiferon gold today Prevnar 13 today Pneumovax later

## 2017-12-20 NOTE — Assessment & Plan Note (Signed)
Continue colestipol

## 2017-12-21 NOTE — Telephone Encounter (Signed)
How many refills Sir, thank you.

## 2017-12-21 NOTE — Telephone Encounter (Signed)
Done erx 

## 2017-12-22 LAB — QUANTIFERON-TB GOLD PLUS
Mitogen-NIL: >10 IU/mL
NIL: 0.09 IU/mL
QuantiFERON-TB Gold Plus: NEGATIVE
TB1-NIL: 0 IU/mL
TB2-NIL: 0.01 [IU]/mL

## 2017-12-22 NOTE — Telephone Encounter (Signed)
OK to refill x 1 w/ 1 refill

## 2017-12-24 NOTE — Progress Notes (Signed)
Neg TB Repeat 11/2017

## 2017-12-27 ENCOUNTER — Other Ambulatory Visit: Payer: Self-pay

## 2017-12-27 MED ORDER — ADALIMUMAB 40 MG/0.4ML ~~LOC~~ AJKT: 40.0000 mg | AUTO-INJECTOR | SUBCUTANEOUS | 6 refills | Status: DC

## 2018-01-03 ENCOUNTER — Ambulatory Visit: Payer: Self-pay | Admitting: General Surgery

## 2018-01-03 DIAGNOSIS — K603 Anal fistula: Secondary | ICD-10-CM | POA: Diagnosis not present

## 2018-01-03 DIAGNOSIS — K602 Anal fissure, unspecified: Secondary | ICD-10-CM | POA: Diagnosis not present

## 2018-01-03 NOTE — H&P (View-Only) (Signed)
History of Present Illness Leighton Ruff MD; 9/70/2637 9:16 AM) The patient is a 42 year old male who presents with an anal fissure. 42 year old male with Crohn's disease who presents to the office for evaluation of chronic anal fissure. This is been present for several months and he has been on diltiazem ointment for approximately 2 months with no sustained relief in his symptoms. He is currently on Humira and is having approximately 2-3 bowel movements a day.   Past Surgical History (Tanisha A. Owens Shark, [place]; 01/03/2018 9:04 AM) Oral Surgery Resection of Small Bowel Tonsillectomy  Diagnostic Studies History (Tanisha A. Owens Shark, [place]; 01/03/2018 9:04 AM) Colonoscopy within last year  Allergies (Tanisha A. Owens Shark, [place]; 01/03/2018 9:05 AM) No Known Drug Allergies [01/03/2018]: Allergies Reconciled  Medication History (Tanisha A. Owens Shark, [place]; 01/03/2018 9:07 AM) ALPRAZolam (2MG Tablet, Oral) Active. Colestipol HCl (1GM Tablet, Oral) Active. Tylenol (325MG Tablet, Oral) Active. Loratadine (10MG Tablet, Oral) Active. Nystatin (100000 UNIT/GM Cream, External) Active. DULoxetine HCl (20MG Capsule DR Part, Oral) Active. Ambien (10MG Tablet, Oral) Active. Lisinopril (20MG Tablet, Oral) Active. Medications Reconciled  Social History (Tanisha A. Owens Shark, [place]; 01/03/2018 9:04 AM) Alcohol use Occasional alcohol use. Caffeine use Carbonated beverages, Coffee, Tea. No drug use Tobacco use Never smoker.  Family History (Tanisha A. Owens Shark, [employer]; 01/03/2018 9:04 AM) Heart Disease Father. Hypertension Father.  Other Problems (Tanisha A. Owens Shark, [place]; 01/03/2018 9:04 AM) Anxiety Disorder Crohn's Disease Depression High blood pressure Kidney Stone Other disease, cancer, significant illness Sleep Apnea     Review of Systems (Tanisha A. Brown RMA; 01/03/2018 9:04 AM) General Not Present- Appetite Loss, Chills, Fatigue, Fever, Night Sweats, Weight Gain and Weight Loss. Skin Not  Present- Change in Wart/Mole, Dryness, Hives, Jaundice, New Lesions, Non-Healing Wounds, Rash and Ulcer. HEENT Present- Seasonal Allergies and Wears glasses/contact lenses. Not Present- Earache, Hearing Loss, Hoarseness, Nose Bleed, Oral Ulcers, Ringing in the Ears, Sinus Pain, Sore Throat, Visual Disturbances and Yellow Eyes. Respiratory Not Present- Bloody sputum, Chronic Cough, Difficulty Breathing, Snoring and Wheezing. Breast Not Present- Breast Mass, Breast Pain, Nipple Discharge and Skin Changes. Cardiovascular Not Present- Chest Pain, Difficulty Breathing Lying Down, Leg Cramps, Palpitations, Rapid Heart Rate, Shortness of Breath and Swelling of Extremities. Gastrointestinal Present- Abdominal Pain, Chronic diarrhea, Nausea and Rectal Pain. Not Present- Bloating, Bloody Stool, Change in Bowel Habits, Constipation, Difficulty Swallowing, Excessive gas, Gets full quickly at meals, Hemorrhoids, Indigestion and Vomiting. Male Genitourinary Not Present- Blood in Urine, Change in Urinary Stream, Frequency, Impotence, Nocturia, Painful Urination, Urgency and Urine Leakage. Musculoskeletal Present- Back Pain. Not Present- Joint Pain, Joint Stiffness, Muscle Pain, Muscle Weakness and Swelling of Extremities. Neurological Not Present- Decreased Memory, Fainting, Headaches, Numbness, Seizures, Tingling, Tremor, Trouble walking and Weakness. Psychiatric Present- Anxiety and Depression. Not Present- Bipolar, Change in Sleep Pattern, Fearful and Frequent crying. Endocrine Present- Heat Intolerance. Not Present- Cold Intolerance, Excessive Hunger, Hair Changes, Hot flashes and New Diabetes. Hematology Not Present- Blood Thinners, Easy Bruising, Excessive bleeding, Gland problems, HIV and Persistent Infections.  Vitals (Tanisha A. Brown RMA; 01/03/2018 9:05 AM) 01/03/2018 9:04 AM Weight: 238.6 lb Height: 69in Body Surface Area: 2.23 m Body Mass Index: 35.23 kg/m  Temp.: 98.81F  Pulse: 93 (Regular)   BP: 122/76 (Sitting, Left Arm, Standard)      Physical Exam Leighton Ruff MD; 8/58/8502 9:17 AM)  General Mental Status-Alert. General Appearance-Not in acute distress. Build & Nutrition-Well nourished. Posture-Normal posture. Gait-Normal.  Head and Neck Head-normocephalic, atraumatic with no lesions or palpable masses. Trachea-midline.  Chest and Lung  Exam Chest and lung exam reveals -on auscultation, normal breath sounds, no adventitious sounds and normal vocal resonance.  Cardiovascular Cardiovascular examination reveals -normal heart sounds, regular rate and rhythm with no murmurs and no digital clubbing, cyanosis, edema, increased warmth or tenderness.  Abdomen Inspection Inspection of the abdomen reveals - No Hernias. Palpation/Percussion Palpation and Percussion of the abdomen reveal - Soft, Non Tender, No Rigidity (guarding), No hepatosplenomegaly and No Palpable abdominal masses.  Neurologic Neurologic evaluation reveals -alert and oriented x 3 with no impairment of recent or remote memory, normal attention span and ability to concentrate, normal sensation and normal coordination.  Musculoskeletal Normal Exam - Bilateral-Upper Extremity Strength Normal and Lower Extremity Strength Normal.    Assessment & Plan Leighton Ruff MD; 0/99/0689 9:24 AM)  ANAL FISSURE AND FISTULA (K60.2) Impression: 42yo M with Crohn's disease who presents to the office with a chronic anal fissure and fistula. I have recommended a fistulotomy with chemical sphincterotomy. We discussed the risk of temporary incontinence with Botox injection. We have discussed the risk of recurrence being higher due to his Crohn's disease. I believe he understands this and wishes to proceed with surgery.

## 2018-01-03 NOTE — H&P (Signed)
History of Present Illness [name] Ruff MD; 2/77/4128 9:16 AM) The patient is a 42 year old male who presents with an anal fissure. 42 year old male with Crohn's disease who presents to the office for evaluation of chronic anal fissure. This is been present for several months and he has been on diltiazem ointment for approximately 2 months with no sustained relief in his symptoms. He is currently on Humira and is having approximately 2-3 bowel movements a day.   Past Surgical History (Tanisha A. Owens Shark, [place]; 01/03/2018 9:04 AM) Oral Surgery Resection of Small Bowel Tonsillectomy  Diagnostic Studies History (Tanisha A. Owens Shark, [place]; 01/03/2018 9:04 AM) Colonoscopy within last year  Allergies (Tanisha A. Owens Shark, [place]; 01/03/2018 9:05 AM) No Known Drug Allergies [01/03/2018]: Allergies Reconciled  Medication History (Tanisha A. Owens Shark, [place]; 01/03/2018 9:07 AM) ALPRAZolam (2MG Tablet, Oral) Active. Colestipol HCl (1GM Tablet, Oral) Active. Tylenol (325MG Tablet, Oral) Active. Loratadine (10MG Tablet, Oral) Active. Nystatin (100000 UNIT/GM Cream, External) Active. DULoxetine HCl (20MG Capsule DR Part, Oral) Active. Ambien (10MG Tablet, Oral) Active. Lisinopril (20MG Tablet, Oral) Active. Medications Reconciled  Social History (Tanisha A. Owens Shark, [place]; 01/03/2018 9:04 AM) Alcohol use Occasional alcohol use. Caffeine use Carbonated beverages, Coffee, Tea. No drug use Tobacco use Never smoker.  Family History (Tanisha A. Owens Shark, [place]; 01/03/2018 9:04 AM) Heart Disease Father. Hypertension Father.  Other Problems (Tanisha A. Owens Shark, [place]; 01/03/2018 9:04 AM) Anxiety Disorder Crohn's Disease Depression High blood pressure Kidney Stone Other disease, cancer, significant illness Sleep Apnea     Review of Systems (Tanisha A. Brown RMA; 01/03/2018 9:04 AM) General Not Present- Appetite Loss, Chills, Fatigue, Fever, Night Sweats, Weight Gain and Weight Loss. Skin Not  Present- Change in Wart/Mole, Dryness, Hives, Jaundice, New Lesions, Non-Healing Wounds, Rash and Ulcer. HEENT Present- Seasonal Allergies and Wears glasses/contact lenses. Not Present- Earache, Hearing Loss, Hoarseness, Nose Bleed, Oral Ulcers, Ringing in the Ears, Sinus Pain, Sore Throat, Visual Disturbances and Yellow Eyes. Respiratory Not Present- Bloody sputum, Chronic Cough, Difficulty Breathing, Snoring and Wheezing. Breast Not Present- Breast Mass, Breast Pain, Nipple Discharge and Skin Changes. Cardiovascular Not Present- Chest Pain, Difficulty Breathing Lying Down, Leg Cramps, Palpitations, Rapid Heart Rate, Shortness of Breath and Swelling of Extremities. Gastrointestinal Present- Abdominal Pain, Chronic diarrhea, Nausea and Rectal Pain. Not Present- Bloating, Bloody Stool, Change in Bowel Habits, Constipation, Difficulty Swallowing, Excessive gas, Gets full quickly at meals, Hemorrhoids, Indigestion and Vomiting. Male Genitourinary Not Present- Blood in Urine, Change in Urinary Stream, Frequency, Impotence, Nocturia, Painful Urination, Urgency and Urine Leakage. Musculoskeletal Present- Back Pain. Not Present- Joint Pain, Joint Stiffness, Muscle Pain, Muscle Weakness and Swelling of Extremities. Neurological Not Present- Decreased Memory, Fainting, Headaches, Numbness, Seizures, Tingling, Tremor, Trouble walking and Weakness. Psychiatric Present- Anxiety and Depression. Not Present- Bipolar, Change in Sleep Pattern, Fearful and Frequent crying. Endocrine Present- Heat Intolerance. Not Present- Cold Intolerance, Excessive Hunger, Hair Changes, Hot flashes and New Diabetes. Hematology Not Present- Blood Thinners, Easy Bruising, Excessive bleeding, Gland problems, HIV and Persistent Infections.  Vitals (Tanisha A. Brown RMA; 01/03/2018 9:05 AM) 01/03/2018 9:04 AM Weight: 238.6 lb Height: 69in Body Surface Area: 2.23 m Body Mass Index: 35.23 kg/m  Temp.: 98.72F  Pulse: 93 (Regular)   BP: 122/76 (Sitting, Left Arm, Standard)      Physical Exam [name] Ruff MD; 7/86/7672 9:17 AM)  General Mental Status-Alert. General Appearance-Not in acute distress. Build & Nutrition-Well nourished. Posture-Normal posture. Gait-Normal.  Head and Neck Head-normocephalic, atraumatic with no lesions or palpable masses. Trachea-midline.  Chest and Lung  Exam Chest and lung exam reveals -on auscultation, normal breath sounds, no adventitious sounds and normal vocal resonance.  Cardiovascular Cardiovascular examination reveals -normal heart sounds, regular rate and rhythm with no murmurs and no digital clubbing, cyanosis, edema, increased warmth or tenderness.  Abdomen Inspection Inspection of the abdomen reveals - No Hernias. Palpation/Percussion Palpation and Percussion of the abdomen reveal - Soft, Non Tender, No Rigidity (guarding), No hepatosplenomegaly and No Palpable abdominal masses.  Neurologic Neurologic evaluation reveals -alert and oriented x 3 with no impairment of recent or remote memory, normal attention span and ability to concentrate, normal sensation and normal coordination.  Musculoskeletal Normal Exam - Bilateral-Upper Extremity Strength Normal and Lower Extremity Strength Normal.    Assessment & Plan [name] Ruff MD; 2/78/0044 9:24 AM)  ANAL FISSURE AND FISTULA (K60.2) Impression: 42yo M with Crohn's disease who presents to the office with a chronic anal fissure and fistula. I have recommended a fistulotomy with chemical sphincterotomy. We discussed the risk of temporary incontinence with Botox injection. We have discussed the risk of recurrence being higher due to his Crohn's disease. I believe he understands this and wishes to proceed with surgery.

## 2018-01-05 ENCOUNTER — Encounter (HOSPITAL_BASED_OUTPATIENT_CLINIC_OR_DEPARTMENT_OTHER): Payer: Self-pay | Admitting: *Deleted

## 2018-01-05 ENCOUNTER — Other Ambulatory Visit: Payer: Self-pay

## 2018-01-05 NOTE — Progress Notes (Signed)
Spoke w/ pt via phone for pre-op interview.  Npo after mn.  Arrive at 0530.  Needs istat 8.  Current ekg in chart and epic.  Will take am meds dos w/ sips of water with exception bp med.

## 2018-01-07 ENCOUNTER — Ambulatory Visit (HOSPITAL_BASED_OUTPATIENT_CLINIC_OR_DEPARTMENT_OTHER): Payer: BLUE CROSS/BLUE SHIELD | Admitting: Anesthesiology

## 2018-01-07 ENCOUNTER — Ambulatory Visit (HOSPITAL_BASED_OUTPATIENT_CLINIC_OR_DEPARTMENT_OTHER)
Admission: RE | Admit: 2018-01-07 | Discharge: 2018-01-07 | Disposition: A | Discharge status: Home / Self Care | Payer: BLUE CROSS/BLUE SHIELD | Source: Ambulatory Visit | Attending: General Surgery | Admitting: General Surgery

## 2018-01-07 ENCOUNTER — Encounter (HOSPITAL_BASED_OUTPATIENT_CLINIC_OR_DEPARTMENT_OTHER): Payer: Self-pay | Admitting: Emergency Medicine

## 2018-01-07 ENCOUNTER — Encounter: Payer: Self-pay | Admitting: Internal Medicine

## 2018-01-07 ENCOUNTER — Encounter (HOSPITAL_BASED_OUTPATIENT_CLINIC_OR_DEPARTMENT_OTHER)
Admission: RE | Disposition: A | Discharge status: Home / Self Care | Payer: Self-pay | Source: Ambulatory Visit | Attending: General Surgery

## 2018-01-07 ENCOUNTER — Telehealth: Payer: Self-pay | Admitting: Internal Medicine

## 2018-01-07 DIAGNOSIS — K603 Anal fistula: Secondary | ICD-10-CM | POA: Insufficient documentation

## 2018-01-07 DIAGNOSIS — K5090 Crohn's disease, unspecified, without complications: Secondary | ICD-10-CM | POA: Insufficient documentation

## 2018-01-07 DIAGNOSIS — Z79899 Other long term (current) drug therapy: Secondary | ICD-10-CM | POA: Insufficient documentation

## 2018-01-07 DIAGNOSIS — G4730 Sleep apnea, unspecified: Secondary | ICD-10-CM | POA: Insufficient documentation

## 2018-01-07 DIAGNOSIS — F419 Anxiety disorder, unspecified: Secondary | ICD-10-CM | POA: Diagnosis not present

## 2018-01-07 DIAGNOSIS — F329 Major depressive disorder, single episode, unspecified: Secondary | ICD-10-CM | POA: Diagnosis not present

## 2018-01-07 DIAGNOSIS — K602 Anal fissure, unspecified: Secondary | ICD-10-CM | POA: Diagnosis not present

## 2018-01-07 DIAGNOSIS — I10 Essential (primary) hypertension: Secondary | ICD-10-CM | POA: Diagnosis not present

## 2018-01-07 DIAGNOSIS — K601 Chronic anal fissure: Secondary | ICD-10-CM | POA: Diagnosis not present

## 2018-01-07 DIAGNOSIS — G4700 Insomnia, unspecified: Secondary | ICD-10-CM | POA: Diagnosis not present

## 2018-01-07 HISTORY — PX: ANAL FISTULOTOMY: SHX6423

## 2018-01-07 HISTORY — DX: Obstructive sleep apnea (adult) (pediatric): G47.33

## 2018-01-07 HISTORY — DX: Personal history of other infectious and parasitic diseases: Z86.19

## 2018-01-07 HISTORY — PX: SPHINCTEROTOMY: SHX5279

## 2018-01-07 HISTORY — DX: Personal history of urinary calculi: Z87.442

## 2018-01-07 HISTORY — DX: Anal fistula: K60.3

## 2018-01-07 HISTORY — DX: Anal fissure, unspecified: K60.2

## 2018-01-07 HISTORY — DX: Acquired absence of other specified parts of digestive tract: Z90.49

## 2018-01-07 LAB — POCT I-STAT, CHEM 8
BUN: 19 mg/dL (ref 6–20)
CALCIUM ION: 1.14 mmol/L — AB (ref 1.15–1.40)
CHLORIDE: 97 mmol/L — AB (ref 98–111)
Creatinine, Ser: 1 mg/dL (ref 0.61–1.24)
GLUCOSE: 107 mg/dL — AB (ref 70–99)
HCT: 44 % (ref 39.0–52.0)
Hemoglobin: 15 g/dL (ref 13.0–17.0)
Potassium: 3.7 mmol/L (ref 3.5–5.1)
Sodium: 138 mmol/L (ref 135–145)
TCO2: 28 mmol/L (ref 22–32)

## 2018-01-07 SURGERY — ANAL FISTULOTOMY
Anesthesia: Monitor Anesthesia Care | Site: Rectum

## 2018-01-07 MED ORDER — SODIUM CHLORIDE 0.9 % IJ SOLN
INTRAMUSCULAR | Status: AC
  Filled 2018-01-07: qty 100

## 2018-01-07 MED ORDER — LIDOCAINE 5 % EX OINT
TOPICAL_OINTMENT | CUTANEOUS | Status: DC | PRN
  Administered 2018-01-07: 1

## 2018-01-07 MED ORDER — OXYCODONE HCL 5 MG/5ML PO SOLN
5.0000 mg | Freq: Once | ORAL | Status: AC | PRN
  Filled 2018-01-07: qty 5

## 2018-01-07 MED ORDER — PROPOFOL 500 MG/50ML IV EMUL
INTRAVENOUS | Status: DC | PRN
  Administered 2018-01-07: 150 ug/kg/min via INTRAVENOUS

## 2018-01-07 MED ORDER — SODIUM CHLORIDE 0.9 % IV SOLN
250.0000 mL | INTRAVENOUS | Status: DC | PRN
  Filled 2018-01-07: qty 250

## 2018-01-07 MED ORDER — LIDOCAINE 2% (20 MG/ML) 5 ML SYRINGE
INTRAMUSCULAR | Status: AC
  Filled 2018-01-07: qty 5

## 2018-01-07 MED ORDER — FENTANYL CITRATE (PF) 100 MCG/2ML IJ SOLN
INTRAMUSCULAR | Status: AC
  Filled 2018-01-07: qty 2

## 2018-01-07 MED ORDER — ACETAMINOPHEN 650 MG RE SUPP
650.0000 mg | RECTAL | Status: DC | PRN
  Filled 2018-01-07: qty 1

## 2018-01-07 MED ORDER — SODIUM CHLORIDE 0.9% FLUSH
3.0000 mL | Freq: Two times a day (BID) | INTRAVENOUS | Status: DC
  Filled 2018-01-07: qty 3

## 2018-01-07 MED ORDER — LIDOCAINE 5 % EX OINT
TOPICAL_OINTMENT | CUTANEOUS | Status: AC
  Filled 2018-01-07: qty 35.44

## 2018-01-07 MED ORDER — GABAPENTIN 300 MG PO CAPS
ORAL_CAPSULE | ORAL | Status: AC
  Filled 2018-01-07: qty 1

## 2018-01-07 MED ORDER — PROPOFOL 10 MG/ML IV BOLUS
INTRAVENOUS | Status: AC
  Filled 2018-01-07: qty 20

## 2018-01-07 MED ORDER — PROPOFOL 10 MG/ML IV BOLUS
INTRAVENOUS | Status: DC | PRN
  Administered 2018-01-07: 20 mg via INTRAVENOUS
  Administered 2018-01-07: 30 mg via INTRAVENOUS

## 2018-01-07 MED ORDER — FENTANYL CITRATE (PF) 100 MCG/2ML IJ SOLN
INTRAMUSCULAR | Status: DC | PRN
  Administered 2018-01-07: 25 ug via INTRAVENOUS

## 2018-01-07 MED ORDER — OXYCODONE HCL 5 MG PO TABS
5.0000 mg | ORAL_TABLET | ORAL | Status: DC | PRN
  Filled 2018-01-07: qty 2

## 2018-01-07 MED ORDER — ACETAMINOPHEN 325 MG PO TABS
650.0000 mg | ORAL_TABLET | ORAL | Status: DC | PRN
  Filled 2018-01-07: qty 2

## 2018-01-07 MED ORDER — LACTATED RINGERS IV SOLN
INTRAVENOUS | Status: DC
  Administered 2018-01-07 (×2): via INTRAVENOUS
  Filled 2018-01-07: qty 1000

## 2018-01-07 MED ORDER — OXYCODONE HCL 5 MG PO TABS
ORAL_TABLET | ORAL | Status: AC
Start: 2018-01-07 — End: ?
  Filled 2018-01-07: qty 1

## 2018-01-07 MED ORDER — GABAPENTIN 300 MG PO CAPS
300.0000 mg | ORAL_CAPSULE | ORAL | Status: AC
  Administered 2018-01-07: 300 mg via ORAL
  Filled 2018-01-07: qty 1

## 2018-01-07 MED ORDER — BUPIVACAINE LIPOSOME 1.3 % IJ SUSP
INTRAMUSCULAR | Status: AC
  Filled 2018-01-07: qty 20

## 2018-01-07 MED ORDER — ONDANSETRON HCL 4 MG/2ML IJ SOLN
INTRAMUSCULAR | Status: AC
  Filled 2018-01-07: qty 2

## 2018-01-07 MED ORDER — DEXAMETHASONE SODIUM PHOSPHATE 4 MG/ML IJ SOLN
INTRAMUSCULAR | Status: DC | PRN
  Administered 2018-01-07: 10 mg via INTRAVENOUS

## 2018-01-07 MED ORDER — MIDAZOLAM HCL 2 MG/2ML IJ SOLN
INTRAMUSCULAR | Status: AC
  Filled 2018-01-07: qty 4

## 2018-01-07 MED ORDER — PROMETHAZINE HCL 25 MG/ML IJ SOLN
6.2500 mg | INTRAMUSCULAR | Status: DC | PRN
  Filled 2018-01-07: qty 1

## 2018-01-07 MED ORDER — DEXAMETHASONE SODIUM PHOSPHATE 10 MG/ML IJ SOLN
INTRAMUSCULAR | Status: AC
  Filled 2018-01-07: qty 1

## 2018-01-07 MED ORDER — OXYCODONE HCL 5 MG PO TABS
5.0000 mg | ORAL_TABLET | Freq: Once | ORAL | Status: AC | PRN
  Administered 2018-01-07: 5 mg via ORAL
  Filled 2018-01-07: qty 1

## 2018-01-07 MED ORDER — ACETAMINOPHEN 500 MG PO TABS
ORAL_TABLET | ORAL | Status: AC
  Filled 2018-01-07: qty 2

## 2018-01-07 MED ORDER — SODIUM CHLORIDE 0.9% FLUSH
3.0000 mL | INTRAVENOUS | Status: DC | PRN
  Filled 2018-01-07: qty 3

## 2018-01-07 MED ORDER — LIDOCAINE HCL (CARDIAC) PF 100 MG/5ML IV SOSY
PREFILLED_SYRINGE | INTRAVENOUS | Status: DC | PRN
  Administered 2018-01-07: 100 mg via INTRAVENOUS

## 2018-01-07 MED ORDER — BUPIVACAINE-EPINEPHRINE (PF) 0.5% -1:200000 IJ SOLN
INTRAMUSCULAR | Status: AC
  Filled 2018-01-07: qty 30

## 2018-01-07 MED ORDER — PROPOFOL 500 MG/50ML IV EMUL
INTRAVENOUS | Status: AC
  Filled 2018-01-07: qty 50

## 2018-01-07 MED ORDER — HYDROCODONE-ACETAMINOPHEN 5-325 MG PO TABS: 1.0000 | ORAL_TABLET | ORAL | 0 refills | Status: DC | PRN

## 2018-01-07 MED ORDER — MIDAZOLAM HCL 5 MG/5ML IJ SOLN
INTRAMUSCULAR | Status: DC | PRN
  Administered 2018-01-07: 1 mg via INTRAVENOUS
  Administered 2018-01-07: 2 mg via INTRAVENOUS

## 2018-01-07 MED ORDER — MEPERIDINE HCL 25 MG/ML IJ SOLN
6.2500 mg | INTRAMUSCULAR | Status: DC | PRN
  Filled 2018-01-07: qty 1

## 2018-01-07 MED ORDER — ACETAMINOPHEN 500 MG PO TABS
1000.0000 mg | ORAL_TABLET | ORAL | Status: AC
  Administered 2018-01-07: 1000 mg via ORAL
  Filled 2018-01-07: qty 2

## 2018-01-07 MED ORDER — BUPIVACAINE-EPINEPHRINE 0.5% -1:200000 IJ SOLN
INTRAMUSCULAR | Status: DC | PRN
  Administered 2018-01-07: 30 mL

## 2018-01-07 MED ORDER — FENTANYL CITRATE (PF) 100 MCG/2ML IJ SOLN
25.0000 ug | INTRAMUSCULAR | Status: DC | PRN
  Administered 2018-01-07: 50 ug via INTRAVENOUS
  Filled 2018-01-07: qty 1

## 2018-01-07 MED ORDER — SODIUM CHLORIDE 0.9 % IJ SOLN
INTRAMUSCULAR | Status: DC | PRN
  Administered 2018-01-07: 50 mL via INTRAVENOUS

## 2018-01-07 MED ORDER — ONABOTULINUMTOXINA 100 UNITS IJ SOLR
INTRAMUSCULAR | Status: DC | PRN
  Administered 2018-01-07: 100 [IU] via INTRAMUSCULAR

## 2018-01-07 MED ORDER — ONDANSETRON HCL 4 MG/2ML IJ SOLN
INTRAMUSCULAR | Status: DC | PRN
  Administered 2018-01-07: 4 mg via INTRAVENOUS

## 2018-01-07 MED ORDER — ONABOTULINUMTOXINA 100 UNITS IJ SOLR
INTRAMUSCULAR | Status: AC
  Filled 2018-01-07: qty 100

## 2018-01-07 SURGICAL SUPPLY — 55 items
BENZOIN TINCTURE PRP APPL 2/3 (GAUZE/BANDAGES/DRESSINGS) ×4 IMPLANT
BLADE EXTENDED COATED 6.5IN (ELECTRODE) IMPLANT
BLADE HEX COATED 2.75 (ELECTRODE) ×2 IMPLANT
BLADE SURG 10 STRL SS (BLADE) IMPLANT
BLADE SURG 15 STRL LF DISP TIS (BLADE) ×1 IMPLANT
BLADE SURG 15 STRL SS (BLADE) ×1
BRIEF STRETCH FOR OB PAD LRG (UNDERPADS AND DIAPERS) ×4 IMPLANT
CANISTER SUCT 3000ML PPV (MISCELLANEOUS) ×2 IMPLANT
COVER BACK TABLE 60X90IN (DRAPES) ×2 IMPLANT
COVER MAYO STAND STRL (DRAPES) ×2 IMPLANT
DECANTER SPIKE VIAL GLASS SM (MISCELLANEOUS) ×2 IMPLANT
DRAPE LAPAROTOMY 100X72 PEDS (DRAPES) ×2 IMPLANT
DRAPE UTILITY XL STRL (DRAPES) ×2 IMPLANT
ELECT REM PT RETURN 9FT ADLT (ELECTROSURGICAL) ×2
ELECTRODE REM PT RTRN 9FT ADLT (ELECTROSURGICAL) ×1 IMPLANT
GAUZE SPONGE 4X4 12PLY STRL (GAUZE/BANDAGES/DRESSINGS) ×2 IMPLANT
GAUZE SPONGE 4X4 16PLY XRAY LF (GAUZE/BANDAGES/DRESSINGS) IMPLANT
GLOVE BIO SURGEON STRL SZ 6.5 (GLOVE) ×4 IMPLANT
GLOVE BIOGEL PI IND STRL 6.5 (GLOVE) ×1 IMPLANT
GLOVE BIOGEL PI IND STRL 7.0 (GLOVE) ×1 IMPLANT
GLOVE BIOGEL PI INDICATOR 6.5 (GLOVE) ×1
GLOVE BIOGEL PI INDICATOR 7.0 (GLOVE) ×1
GLOVE INDICATOR 7.0 STRL GRN (GLOVE) ×2 IMPLANT
GOWN SPEC L3 XXLG W/TWL (GOWN DISPOSABLE) ×2 IMPLANT
GOWN STRL REUS W/TWL 2XL LVL3 (GOWN DISPOSABLE) ×2 IMPLANT
HYDROGEN PEROXIDE 16OZ (MISCELLANEOUS) ×2 IMPLANT
KIT TURNOVER CYSTO (KITS) ×2 IMPLANT
LOOP VESSEL MAXI BLUE (MISCELLANEOUS) IMPLANT
NDL SAFETY ECLIPSE 18X1.5 (NEEDLE) IMPLANT
NEEDLE HYPO 18GX1.5 SHARP (NEEDLE)
NEEDLE HYPO 22GX1.5 SAFETY (NEEDLE) ×2 IMPLANT
NS IRRIG 500ML POUR BTL (IV SOLUTION) ×2 IMPLANT
PACK BASIN DAY SURGERY FS (CUSTOM PROCEDURE TRAY) ×2 IMPLANT
PAD ABD 8X10 STRL (GAUZE/BANDAGES/DRESSINGS) ×2 IMPLANT
PAD ARMBOARD 7.5X6 YLW CONV (MISCELLANEOUS) IMPLANT
PENCIL BUTTON HOLSTER BLD 10FT (ELECTRODE) ×2 IMPLANT
SPONGE HEMORRHOID 8X3CM (HEMOSTASIS) IMPLANT
SPONGE SURGIFOAM ABS GEL 12-7 (HEMOSTASIS) IMPLANT
SUCTION FRAZIER HANDLE 10FR (MISCELLANEOUS)
SUCTION TUBE FRAZIER 10FR DISP (MISCELLANEOUS) IMPLANT
SUT CHROMIC 2 0 SH (SUTURE) IMPLANT
SUT CHROMIC 3 0 SH 27 (SUTURE) IMPLANT
SUT ETHIBOND 0 (SUTURE) IMPLANT
SUT VIC AB 2-0 SH 27 (SUTURE)
SUT VIC AB 2-0 SH 27XBRD (SUTURE) IMPLANT
SUT VIC AB 3-0 SH 18 (SUTURE) IMPLANT
SUT VIC AB 4-0 P-3 18XBRD (SUTURE) IMPLANT
SUT VIC AB 4-0 P3 18 (SUTURE)
SYR CONTROL 10ML LL (SYRINGE) ×2 IMPLANT
TOWEL OR 17X24 6PK STRL BLUE (TOWEL DISPOSABLE) ×2 IMPLANT
TRAY DSU PREP LF (CUSTOM PROCEDURE TRAY) ×2 IMPLANT
TUBE CONNECTING 12X1/4 (SUCTIONS) ×2 IMPLANT
UNDERPAD 30X30 (UNDERPADS AND DIAPERS) ×2 IMPLANT
WATER STERILE IRR 500ML POUR (IV SOLUTION) ×2 IMPLANT
YANKAUER SUCT BULB TIP NO VENT (SUCTIONS) ×2 IMPLANT

## 2018-01-07 NOTE — Transfer of Care (Signed)
Last Vitals:  Vitals Value Taken Time  BP 157/77 01/07/2018  7:56 AM  Temp 36.5 C 01/07/2018  7:56 AM  Pulse 86 01/07/2018  7:59 AM  Resp 21 01/07/2018  7:59 AM  SpO2 92 % 01/07/2018  7:59 AM  Vitals shown include unvalidated device data.  Last Pain:  Vitals:   01/07/18 0536  TempSrc: Oral  PainSc: 0-No pain         Immediate Anesthesia Transfer of Care Note  Patient: [name] [name]  Procedure(s) Performed: Procedure(s) (LRB): ANAL FISTULOTOMY (N/A) CHEMICAL SPHINCTEROTOMY (BOTOX) (N/A)  Patient Location: PACU  Anesthesia Type: General  Level of Consciousness: awake, alert  and oriented  Airway & Oxygen Therapy: Patient Spontanous Breathing and Patient connected to nasal cannula oxygen  Post-op Assessment: Report given to PACU RN and Post -op Vital signs reviewed and stable  Post vital signs: Reviewed and stable  Complications: No apparent anesthesia complications

## 2018-01-07 NOTE — Anesthesia Preprocedure Evaluation (Signed)
Anesthesia Evaluation  Patient identified by MRN, date of birth, ID band Patient awake    Reviewed: Allergy & Precautions, NPO status , Patient's Chart, lab work & pertinent test results  Airway Mallampati: II   Neck ROM: full    Dental   Pulmonary neg pulmonary ROS, sleep apnea ,    breath sounds clear to auscultation       Cardiovascular hypertension, Pt. on medications  Rhythm:regular Rate:Normal     Neuro/Psych  Headaches, PSYCHIATRIC DISORDERS Anxiety Depression    GI/Hepatic negative GI ROS, Neg liver ROS,   Endo/Other  negative endocrine ROSobese  Renal/GU negative Renal ROS     Musculoskeletal negative musculoskeletal ROS (+)   Abdominal (+) + obese,   Peds  Hematology negative hematology ROS (+)   Anesthesia Other Findings   Reproductive/Obstetrics                             Anesthesia Physical  Anesthesia Plan  ASA: II  Anesthesia Plan: MAC   Post-op Pain Management:    Induction: Intravenous  PONV Risk Score and Plan: 1 and Propofol infusion and TIVA  Airway Management Planned: Simple Face Mask  Additional Equipment:   Intra-op Plan:   Post-operative Plan:   Informed Consent: I have reviewed the patients History and Physical, chart, labs and discussed the procedure including the risks, benefits and alternatives for the proposed anesthesia with the patient or authorized representative who has indicated his/her understanding and acceptance.   Dental advisory given  Plan Discussed with: CRNA  Anesthesia Plan Comments:         Anesthesia Quick Evaluation

## 2018-01-07 NOTE — Interval H&P Note (Signed)
pt ready for surgery.   all questions answered.

## 2018-01-07 NOTE — Anesthesia Procedure Notes (Signed)
Procedure Name: MAC Date/Time: 01/07/2018 7:31 AM Performed by: [name], MD Pre-anesthesia Checklist: Patient identified, Emergency Drugs available, Suction available, Patient being monitored and Timeout performed Oxygen Delivery Method: Nasal cannula Placement Confirmation: positive ETCO2,  CO2 detector and breath sounds checked- equal and bilateral

## 2018-01-07 NOTE — Telephone Encounter (Signed)
Patient had botox this am and they recommended that he take a fiber supplement. He is reluctant to do that.  He asks if rather than a fiber supplement he split his colestipol 5 at dinner through out the day?

## 2018-01-07 NOTE — Op Note (Signed)
01/07/2018  7:53 AM  PATIENT:  [name] [name]  42 y.o. male  Patient Care Team: Biagio Borg, MD as PCP - General (Internal Medicine)  PRE-OPERATIVE DIAGNOSIS:  Anal fissure and fistula  POST-OPERATIVE DIAGNOSIS:  Anal fissure and fistula  PROCEDURE:   ANAL FISTULOTOMY CHEMICAL SPHINCTEROTOMY (BOTOX)  Surgeon(s): Leighton Ruff, MD  ASSISTANT: none   ANESTHESIA:   local and MAC  SPECIMEN:  No Specimen  DISPOSITION OF SPECIMEN:  N/A  COUNTS:  YES  PLAN OF CARE: Discharge to home after PACU  PATIENT DISPOSITION:  PACU - hemodynamically stable.  INDICATION: 42 year old male with Crohn's disease who presents to the office with a chronic posterior midline anal fissure.  He has attempted medical treatments with no change in his symptoms.  He is here today to undergo chemical sphincterotomy.  On exam he was also found to have what appears to be a fistula arising around the fissure.  It does not appear to involve any sphincter complex.  I recommended fistulotomy as well.   OR FINDINGS: Anal fistula and fissure at posterior midline  DESCRIPTION: the patient was identified in the preoperative holding area and taken to the OR where they were laid on the operating room table.  MAC anesthesia was induced without difficulty. The patient was then positioned in prone jackknife position with buttocks gently taped apart.  The patient was then prepped and draped in usual sterile fashion.  SCDs were noted to be in place prior to the initiation of anesthesia. A surgical timeout was performed indicating the correct patient, procedure, positioning and need for preoperative antibiotics.  A rectal block was performed using Marcaine with epinephrine.    I began with a digital rectal exam.  There were no internal masses noted.  I then placed a Hill-Ferguson anoscope into the anal canal and evaluated this completely.  There was significant sphincter hypertension.  There was a posterior midline fissure.   There was a skin tag distal to this fissure with a fistula arising underneath.  A fistulotomy was performed.  I then injected 100 units of Botox into the intersphincteric space.  The patient tolerated this well.  He was awakened from anesthesia and sent to the postanesthesia care unit stable condition.  All counts were correct per operating room staff.  I have reviewed the [facility] database for this patient.

## 2018-01-07 NOTE — Telephone Encounter (Signed)
I left a detailed message for the patient with the instructions.  He is asked to call back for any additional questions or concerns.

## 2018-01-07 NOTE — Telephone Encounter (Signed)
Pt called to follow up on message that he sent through 'My chart" about botox procedure.

## 2018-01-07 NOTE — Telephone Encounter (Signed)
In his case I agree with not doing the fiber  He can adjust the colestipol to try to avoid straining and hard stools but I doubt he ever has those issues anyway

## 2018-01-07 NOTE — Discharge Instructions (Addendum)
ANORECTAL SURGERY: POST OP INSTRUCTIONS 1. Take your usually prescribed home medications unless otherwise directed. 2. DIET: During the first few hours after surgery sip on some liquids until you are able to urinate.  It is normal to not urinate for several hours after this surgery.  If you feel uncomfortable, please contact the office for instructions.  After you are able to urinate,you may eat, if you feel like it.  Follow a light bland diet the first 24 hours after arrival home, such as soup, liquids, crackers, etc.  Be sure to include lots of fluids daily (6-8 glasses).  Avoid fast food or heavy meals, as your are more likely to get nauseated.  Eat a low fat diet the next few days after surgery.  Limit caffeine intake to 1-2 servings a day. 3. PAIN CONTROL: a. Pain is best controlled by a usual combination of several different methods TOGETHER: i. Muscle relaxation: Soak in a warm bath (or Sitz bath) three times a day and after bowel movements.  Continue to do this until all pain is resolved. ii. Over the counter pain medication iii. Prescription pain medication b. Most patients will experience some swelling and discomfort in the anus/rectal area and incisions.  Heat such as warm towels, sitz baths, warm baths, etc to help relax tight/sore spots and speed recovery.  Some people prefer to use ice, especially in the first couple days after surgery, as it may decrease the pain and swelling, or alternate between ice & heat.  Experiment to what works for you.  Swelling and bruising can take several weeks to resolve.  Pain can take even longer to completely resolve. c. It is helpful to take an over-the-counter pain medication regularly for the first few weeks.  Choose one of the following that works best for you: i. Naproxen (Aleve, etc)  Two 263m tabs twice a day ii. Ibuprofen (Advil, etc) Three 2012mtabs four times a day (every meal & bedtime) d. A  prescription for pain medication (such as percocet,  oxycodone, hydrocodone, etc) should be given to you upon discharge.  Take your pain medication as prescribed.  i. If you are having problems/concerns with the prescription medicine (does not control pain, nausea, vomiting, rash, itching, etc), please call [phone] see if we need to switch you to a different pain medicine that will work better for you and/or control your side effect better. ii. If you need a refill on your pain medication, please contact your pharmacy.  They will contact our office to request authorization. Prescriptions will not be filled after 5 pm or on week-ends. 4. KEEP YOUR BOWELS REGULAR and AVOID CONSTIPATION a. The goal is one to two soft bowel movements a day.  You should at least have a bowel movement every other day. b. Avoid getting constipated.  Between the surgery and the pain medications, it is common to experience some constipation. This can be very painful after rectal surgery.  Increasing fluid intake and taking a fiber supplement (such as Metamucil, Citrucel, FiberCon, etc) 1-2 times a day regularly will usually help prevent this problem from occurring.  A stool softener like colace is also recommended.  This can be purchased over the counter at your pharmacy.  You can take it up to 3 times a day.  If you do not have a bowel movement after 24 hrs since your surgery, take one does of milk of magnesia.  If you still haven't had a bowel movement 8-12 hours after  that dose, take another dose.  If you don't have a bowel movement 48 hrs after surgery, purchase a Fleets enema from the drug store and administer gently per package instructions.  If you still are having trouble with your bowel movements after that, please call the office for further instructions. c. If you develop diarrhea or have many loose bowel movements, simplify your diet to bland foods & liquids for a few days.  Stop any stool softeners and decrease your fiber supplement.  Switching to mild  anti-diarrheal medications (Kayopectate, Pepto Bismol) can help.  If this worsens or does not improve, please call us.  5. Wound Care a. Remove your bandages before your first bowel movement or 8 hours after surgery.     b. Remove any wound packing material at this tim,e as well.  You do not need to repack the wound unless instructed otherwise.  Wear an absorbent pad or soft cotton gauze in your underwear to catch any drainage and help keep the area clean. You should change this every 2-3 hours while awake. c. Keep the area clean and dry.  Bathe / shower every day, especially after bowel movements.  Keep the area clean by showering / bathing over the incision / wound.   It is okay to soak an open wound to help wash it.  Wet wipes or showers / gentle washing after bowel movements is often less traumatic than regular toilet paper. d. Tasman Bast may have some styrofoam-like soft packing in the rectum which will come out with the first bowel movement.  e. You will often notice bleeding with bowel movements.  This should slow down by the end of the first week of surgery f. Expect some drainage.  This should slow down, too, by the end of the first week of surgery.  Wear an absorbent pad or soft cotton gauze in your underwear until the drainage stops. g. Do Not sit on a rubber or pillow ring.  This can make you symptoms worse.  You may sit on a soft pillow if needed.  6. ACTIVITIES as tolerated:   a. You may resume regular (light) daily activities beginning the next day--such as daily self-care, walking, climbing stairs--gradually increasing activities as tolerated.  If you can walk 30 minutes without difficulty, it is safe to try more intense activity such as jogging, treadmill, bicycling, low-impact aerobics, swimming, etc. b. Save the most intensive and strenuous activity for last such as sit-ups, heavy lifting, contact sports, etc  Refrain from any heavy lifting or straining until you are off narcotics for pain  control.   c. You may drive when you are no longer taking prescription pain medication, you can comfortably sit for long periods of time, and you can safely maneuver your car and apply brakes. d. Demetrie Bast may have sexual intercourse when it is comfortable.  7. FOLLOW UP in our office a. Please call [facility] at [phone] to set up an appointment to see your surgeon in the office for a follow-up appointment approximately 3-4 weeks after your surgery. b. Make sure that you call for this appointment the day you arrive home to insure a convenient appointment time. 10. IF YOU HAVE DISABILITY OR FAMILY LEAVE FORMS, BRING THEM TO THE OFFICE FOR PROCESSING.  DO NOT GIVE THEM TO YOUR DOCTOR.     WHEN TO CALL us [phone]: 1. Poor pain control 2. Reactions / problems with new medications (rash/itching, nausea, etc)  3. Fever over 101.5 F (38.5 C) 4.  Inability to urinate 5. Nausea and/or vomiting 6. Worsening swelling or bruising 7. Continued bleeding from incision. 8. Increased pain, redness, or drainage from the incision  The clinic staff is available to answer your questions during regular business hours (8:30am-5pm).  Please dont hesitate to call and ask to speak to one of our nurses for clinical concerns.   A surgeon from [facility] is always on call at the hospitals   If you have a medical emergency, go to the nearest emergency room or call 911.    [facility], [place], [place], [place], [place]  [address] ? MAIN: [phone] ? TOLL FREE: [phone] ? FAX [phone] [email or web address]  Information for Discharge Teaching: EXPAREL (bupivacaine liposome injectable suspension)   Your surgeon gave you EXPAREL(bupivacaine) in your surgical incision to help control your pain after surgery.   EXPAREL is a local anesthetic that provides pain relief by numbing the tissue around the surgical site.  EXPAREL is designed to release  pain medication over time and can control pain for up to 72 hours.  Depending on how you respond to EXPAREL, you may require less pain medication during your recovery.  Possible side effects:  Temporary loss of sensation or ability to move in the area where bupivacaine was injected.  Nausea, vomiting, constipation  Rarely, numbness and tingling in your mouth or lips, lightheadedness, or anxiety may occur.  Call your doctor right away if you think you may be experiencing any of these sensations, or if you have other questions regarding possible side effects.  Follow all other discharge instructions given to you by your surgeon or nurse. Eat a healthy diet and drink plenty of water or other fluids.  If you return to the hospital for any reason within 96 hours following the administration of EXPAREL, please inform your health care providers. Post Anesthesia Home Care Instructions  Activity: Get plenty of rest for the remainder of the day. A responsible individual must stay with you for 24 hours following the procedure.  For the next 24 hours, DO NOT: -Drive a car -Paediatric nurse -Drink alcoholic beverages -Take any medication unless instructed by your physician -Make any legal decisions or sign important papers.  Meals: Start with liquid foods such as gelatin or soup. Progress to regular foods as tolerated. Avoid greasy, spicy, heavy foods. If nausea and/or vomiting occur, drink only clear liquids until the nausea and/or vomiting subsides. Call your physician if vomiting continues.  Special Instructions/Symptoms: Your throat may feel dry or sore from the anesthesia or the breathing tube placed in your throat during surgery. If this causes discomfort, gargle with warm salt water. The discomfort should disappear within 24 hours.

## 2018-01-10 ENCOUNTER — Encounter (HOSPITAL_BASED_OUTPATIENT_CLINIC_OR_DEPARTMENT_OTHER): Payer: Self-pay | Admitting: General Surgery

## 2018-01-10 NOTE — Anesthesia Postprocedure Evaluation (Signed)
Anesthesia Post Note  Patient: [name] [name]  Procedure(s) Performed: ANAL FISTULOTOMY (N/A Rectum) CHEMICAL SPHINCTEROTOMY (BOTOX) (N/A Rectum)     Patient location during evaluation: PACU Anesthesia Type: MAC Level of consciousness: awake and alert Pain management: pain level controlled Vital Signs Assessment: post-procedure vital signs reviewed and stable Respiratory status: spontaneous breathing Cardiovascular status: stable Anesthetic complications: no    Last Vitals:  Vitals:   01/07/18 0845 01/07/18 0940  BP: (!) 148/82 130/79  Pulse: 61 68  Resp: 10 15  Temp:  36.8 C  SpO2: 98% 100%    Last Pain:  Vitals:   01/07/18 0940  TempSrc: Oral  PainSc:                  [name]

## 2018-01-17 ENCOUNTER — Other Ambulatory Visit: Payer: Self-pay | Admitting: Internal Medicine

## 2018-01-17 MED ORDER — ADALIMUMAB 40 MG/0.4ML ~~LOC~~ AJKT: 40.0000 mg | AUTO-INJECTOR | SUBCUTANEOUS | 3 refills | Status: DC

## 2018-01-17 NOTE — Telephone Encounter (Signed)
The pt prescription was sent for 3 months supply as requested

## 2018-03-17 ENCOUNTER — Other Ambulatory Visit: Payer: Self-pay | Admitting: Internal Medicine

## 2018-03-17 NOTE — Telephone Encounter (Signed)
Done erx 

## 2018-03-18 NOTE — Telephone Encounter (Signed)
MD approved and sent electronically to [facility]../lmb  

## 2018-03-18 NOTE — Telephone Encounter (Signed)
LOV w/ you: 04/2017 Last labs: 03/2017 No existing appts Ok to Rf?

## 2018-04-20 ENCOUNTER — Other Ambulatory Visit: Payer: Self-pay | Admitting: Internal Medicine

## 2018-05-21 ENCOUNTER — Other Ambulatory Visit: Payer: Self-pay | Admitting: Internal Medicine

## 2018-05-23 ENCOUNTER — Encounter: Payer: Self-pay | Admitting: Internal Medicine

## 2018-05-23 NOTE — Telephone Encounter (Signed)
Done erx 

## 2018-05-23 NOTE — Telephone Encounter (Signed)
   LOV: 05/10/17 NextOV: n/s Last Filled/Quantity:05/04/18 30#

## 2018-06-09 ENCOUNTER — Other Ambulatory Visit: Payer: Self-pay | Admitting: Internal Medicine

## 2018-06-16 ENCOUNTER — Telehealth: Payer: Self-pay | Admitting: Internal Medicine

## 2018-06-21 NOTE — Telephone Encounter (Signed)
Relation to pt: self  Call back number: [phone] [facility]) Pharmacy: [facility], [address] [phone] (Phone) [phone] (Fax)     Reason for call:  Patient scheduled follow up appointment with Dr. Jenny Reichmann for 06/28/2018, patient would like PCP to fill a 30 day supply to hold him over until appointment, please advise

## 2018-06-23 MED ORDER — ALPRAZOLAM 2 MG PO TABS: ORAL_TABLET | ORAL | 0 refills | Status: DC

## 2018-06-23 NOTE — Telephone Encounter (Signed)
Ok this is done 

## 2018-06-23 NOTE — Addendum Note (Signed)
Addended by: Biagio Borg on: 06/23/2018 12:51 PM   Modules accepted: Orders

## 2018-06-23 NOTE — Telephone Encounter (Signed)
Patient is asking if Dr Jenny Reichmann will go ahead and refill his alprazolam [name] [name]) 2 MG tablet since he made an appointment for his next available on January 7th. Please Advise. He is currently out. Call back and advise [phone]

## 2018-06-28 ENCOUNTER — Ambulatory Visit: Payer: BLUE CROSS/BLUE SHIELD | Admitting: Internal Medicine

## 2018-06-29 ENCOUNTER — Other Ambulatory Visit: Payer: Self-pay

## 2018-06-29 ENCOUNTER — Ambulatory Visit (HOSPITAL_COMMUNITY)
Admission: EM | Admit: 2018-06-29 | Discharge: 2018-06-29 | Disposition: A | Discharge status: Home / Self Care | Payer: BLUE CROSS/BLUE SHIELD | Attending: Family Medicine | Admitting: Family Medicine

## 2018-06-29 ENCOUNTER — Encounter (HOSPITAL_COMMUNITY): Payer: Self-pay | Admitting: Emergency Medicine

## 2018-06-29 ENCOUNTER — Ambulatory Visit: Payer: Self-pay | Admitting: *Deleted

## 2018-06-29 DIAGNOSIS — J40 Bronchitis, not specified as acute or chronic: Secondary | ICD-10-CM | POA: Diagnosis not present

## 2018-06-29 MED ORDER — ALBUTEROL SULFATE 108 (90 BASE) MCG/ACT IN AEPB: 1.0000 | INHALATION_SPRAY | Freq: Two times a day (BID) | RESPIRATORY_TRACT | 0 refills | Status: DC

## 2018-06-29 MED ORDER — DOXYCYCLINE HYCLATE 100 MG PO CAPS: 100.0000 mg | ORAL_CAPSULE | Freq: Two times a day (BID) | ORAL | 0 refills | Status: DC

## 2018-06-29 NOTE — ED Triage Notes (Signed)
Pt reports chest tightness, SOB, facial pain/tightness and body aches since Saturday.  Pt has been taking Mucinex with little relief.

## 2018-06-29 NOTE — Telephone Encounter (Signed)
Pt reports congestion, productive cough, body aches, onset Saturday. Pt states wheezing "Pretty much constant." Reports SOB at rest. States "May have a fever, been taking tylenol every 6 hours for body aches." States feels warm. Pt reports cough is productive for yellowish thick phlegm. Audible wheezing during call. Speech somewhat haltering. Pt directed to UC. States he will follow disposition. Care advise given per protocol.   Reason for Disposition . Wheezing is present  Answer Assessment - Initial Assessment Questions 1. ONSET: "When did the cough begin?"      Saturday  2. SEVERITY: "How bad is the cough today?"      severe 3. RESPIRATORY DISTRESS: "Describe your breathing."      SOB with coughing, exp wheeze 4. FEVER: "Do you have a fever?" If so, ask: "What is your temperature, how was it measured, and when did it start?"     Tylenol every 6 hours, feels feverish 5. SPUTUM: "Describe the color of your sputum" (clear, white, yellow, green)     yellowish 6. HEMOPTYSIS: "Are you coughing up any blood?" If so ask: "How much?" (flecks, streaks, tablespoons, etc.)    no 7. CARDIAC HISTORY: "Do you have any history of heart disease?" (e.g., heart attack, congestive heart failure)      HTN 8. LUNG HISTORY: "Do you have any history of lung disease?"  (e.g., pulmonary embolus, asthma, emphysema)     No 9. PE RISK FACTORS: "Do you have a history of blood clots?" (or: recent major surgery, recent prolonged travel, bedridden)     no 10. OTHER SYMPTOMS: "Do you have any other symptoms?" (e.g., runny nose, wheezing, chest pain)       wheeze, body aches  Protocols used: COUGH - ACUTE PRODUCTIVE-A-AH

## 2018-06-29 NOTE — ED Provider Notes (Signed)
[place]    CSN: [id number] Arrival date & time: 06/29/18  1450     History   Chief Complaint Chief Complaint  Patient presents with  . URI    HPI [name] [name] is a 43 y.o. male.   Patient has productive cough with chest tightness.  Also complains of some shortness of breath facial pain and tightness diffuse myalgias.  He has been taking Mucinex without relief, but he also has been taking Claritin which probably cancels effects of Mucinex.  Cough is productive of dark sputum.  He also has multiple other medical problems including Crohn's disease and long-term use of immunosuppressant medication.  There is also a history of C. difficile. HPI  Past Medical History:  Diagnosis Date  . Anal fissure and fistula    chronic  . Anxiety   . 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 Crohn's disease [facility]) followed by dr Carlean Purl   small and large intestines--  hx bowel resection 02/ 2009--  currently treated w/ humira  . Depression   . Essential hypertension, benign   . History of CMV 10/14/2010   resolved w/ antiviral therapy  . History of kidney stones   . History of resection of small bowel   . Hyperlipidemia   . OSA (obstructive sleep apnea)    01-05-2018 per pt resolved since tonsils removed   . Vitamin D deficiency     Patient Active Problem List   Diagnosis Date Noted  . 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 Perianal dermatitis 10/26/2017  . Wellness examination 05/10/2017  . High risk sexual behavior 06/30/2016  . Folliculitis 16/96/7893  . Ganglion cyst of flexor tendon sheath 11/02/2014  . Chronic posterior anal fissure 02/15/2014  . Tinea pedis 06/27/2013  . Long-term use of immunosuppressant medication - Humira 10/14/2010  . Obesity, unspecified 07/20/2007  . Anxiety with depression 07/20/2007  . Insomnia 07/20/2007  . Essential hypertension, benign 07/20/2007  . CROHN'S DISEASE, LARGE AND  SMALL INTESTINES 07/20/2007    Past Surgical History:  Procedure Laterality Date  . ANAL FISTULOTOMY N/A 01/07/2018   Procedure: ANAL FISTULOTOMY;  Surgeon: Leighton Ruff, MD;  Location: [facility];  Service: General;  Laterality: N/A;  . COLONOSCOPY  last one 04-2017   dr Carlean Purl  . EXPLORATORY LAPAROTOMY W/ ILEOSECECTOMY W/ PRIMARY ANASTAMOSIS  08-05-2007    dr gross  [facility]   crohn's, sbo, perferation's  . GANGLION CYST EXCISION Right 2016   wrist  . HYPOSPADIAS CORRECTION  CHILD  . SPHINCTEROTOMY N/A 01/07/2018   Procedure: CHEMICAL SPHINCTEROTOMY (BOTOX);  Surgeon: Leighton Ruff, MD;  Location: [facility];  Service: General;  Laterality: N/A;  . TONSILLECTOMY Bilateral 09/30/2015   Procedure: BILATERAL TONSILLECTOMY;  Surgeon: Izora Gala, MD;  Location: [place];  Service: ENT;  Laterality: Bilateral;  . UPPER GASTROINTESTINAL ENDOSCOPY  10/15/2010   w/biopsy, mild gastritis and duodenitis       Home Medications    Prior to Admission medications   Medication Sig Start Date End Date Taking? Authorizing Provider  Adalimumab (HUMIRA PEN) 40 MG/0.4ML PNKT Inject 40 mg into the skin once a week. 01/17/18  Yes Gatha Mayer, MD  ALPRAZolam (XANAX XR) 3 MG 24 hr tablet TAKE 1 TABLET (3 MG TOTAL) BY MOUTH EVERY MORNING. 05/23/18  Yes Biagio Borg, MD  colestipol (COLESTID) 1 g tablet TAKE 5 TABLETS BY MOUTH DAILY Patient taking differently: TAKE 5 TABLETS BY MOUTH DAILY--- takes  in evening 10/27/17  Yes Gatha Mayer, MD  diphenoxylate-atropine (LOMOTIL) 2.5-0.025 MG tablet TAKE 1 TABLET BY MOUTH FOUR TIMES A DAY AS NEEDED FOR DIARRHEA / LOOSE STOOLS Patient taking differently: TAKE 1 TABLET BY MOUTH FOUR TIMES A DAY AS NEEDED FOR DIARRHEA / LOOSE STOOLS--- per pt takes every am and prn 12/22/17  Yes Gatha Mayer, MD  DULoxetine (CYMBALTA) 60 MG capsule TAKE 1 CAPSULE BY MOUTH ONCE DAILY 06/10/18  Yes Biagio Borg, MD  lisinopril-hydrochlorothiazide  (PRINZIDE,ZESTORETIC) 20-25 MG tablet TAKE 1 TABLET BY MOUTH EVERY DAY 04/20/18  Yes Biagio Borg, MD  loratadine (CLARITIN) 10 MG tablet Take 10 mg by mouth every morning.    Yes [provider]  ondansetron (ZOFRAN) 4 MG tablet TAKE 1 TAB BY MOUTH EVERY 6 HOURS 02/04/17  Yes Gatha Mayer, MD  TRUVADA 200-300 MG tablet TAKE 1 TABLET BY MOUTH EVERY DAY 03/18/18  Yes Biagio Borg, MD  zolpidem (AMBIEN) 10 MG tablet TAKE 1 TABLET (10 MG TOTAL) BY MOUTH AT BEDTIME AS NEEDED FOR SLEEP 12/21/17  Yes Biagio Borg, MD  acetaminophen (TYLENOL) 325 MG tablet Take 650 mg by mouth every 6 (six) hours as needed for moderate pain.    [provider]  acidophilus (RISAQUAD) CAPS capsule Take 1 capsule by mouth daily.    [provider]  alprazolam Duanne Moron) 2 MG tablet TAKE 1/2 TAB BY MOUTH TWICE DAILY AS NEEDED 06/23/18   Biagio Borg, MD  HYDROcodone-acetaminophen (NORCO/VICODIN) 5-325 MG tablet Take 1 tablet by mouth every 4 (four) hours as needed. 0/35/00   Leighton Ruff, MD    Family History Family History  Problem Relation Age of Onset  . Heart disease Father        CAD/MI  . Hyperlipidemia Father   . Hypertension Father   . Gout Father   . Sleep apnea Father   . Obesity Mother   . Allergies Mother   . Cancer Mother   . Stroke Maternal Grandfather   . Allergies Sister   . Asthma Sister   . Colon cancer Neg Hx   . Diabetes Neg Hx   . COPD Neg Hx   . Colon polyps Neg Hx     Social History Social History   Tobacco Use  . Smoking status: Never Smoker  . Smokeless tobacco: Never Used  Substance Use Topics  . Alcohol use: Yes    Alcohol/week: 3.0 standard drinks    Types: 3 Shots of liquor per week  . Drug use: No     Allergies   Patient has no known allergies.   Review of Systems Review of Systems  Constitutional: Negative.   HENT: Positive for postnasal drip and sinus pressure.   Respiratory: Positive for cough, shortness of breath and wheezing.     Musculoskeletal: Positive for myalgias.  All other systems reviewed and are negative.    Physical Exam Triage Vital Signs ED Triage Vitals  Enc Vitals Group     BP 06/29/18 1527 119/82     Pulse Rate 06/29/18 1527 85     Resp --      Temp 06/29/18 1527 98.5 F (36.9 C)     Temp Source 06/29/18 1527 Oral     SpO2 06/29/18 1527 95 %     Weight --      Height --      Head Circumference --      Peak Flow --  Pain Score 06/29/18 1526 6     Pain Loc --      Pain Edu? --      Excl. in [place]? --    No data found.  Updated Vital Signs BP 119/82 (BP Location: Right Arm)   Pulse 85   Temp 98.5 F (36.9 C) (Oral)   SpO2 95%   Visual Acuity Right Eye Distance:   Left Eye Distance:   Bilateral Distance:    Right Eye Near:   Left Eye Near:    Bilateral Near:     Physical Exam Constitutional:      Appearance: Normal appearance.  HENT:     Nose:     Comments: There is tenderness to percussion over the maxillary sinuses Cardiovascular:     Rate and Rhythm: Normal rate and regular rhythm.  Pulmonary:     Effort: Pulmonary effort is normal.     Breath sounds: Wheezing and rhonchi present.  Neurological:     General: No focal deficit present.     Mental Status: He is alert and oriented to person, place, and time.      UC Treatments / Results  Labs (all labs ordered are listed, but only abnormal results are displayed) Labs Reviewed - No data to display  EKG None  Radiology No results found.  Procedures Procedures (including critical care time)  Medications Ordered in UC Medications - No data to display  Initial Impression / Assessment and Plan / UC Course  I have reviewed the triage vital signs and the nursing notes.  Pertinent labs & imaging results that were available during my care of the patient were reviewed by me and considered in my medical decision making (see chart for details).     Sinobronchitis.  Discussed use of antibiotics given his  history and pill burden.  We mutually decided to try bacteriostatic antibiotic, doxycycline.  He is to hold Claritin continue Mucinex.  We will also provide albuterol inhaler for wheezing and shortness of breath. Final Clinical Impressions(s) / UC Diagnoses   Final diagnoses:  None   Discharge Instructions   None    ED Prescriptions    None     Controlled Substance Prescriptions [place] Controlled Substance Registry consulted? No   Wardell Honour, MD 06/29/18 [phone]

## 2018-06-29 NOTE — Discharge Instructions (Addendum)
Hold Claritin Drink plenty of fluids and spend a few extra minutes in the hot shower breathing in steam May use a vaporizer humidifier in bedroom

## 2018-07-07 ENCOUNTER — Encounter: Payer: Self-pay | Admitting: Internal Medicine

## 2018-07-07 ENCOUNTER — Other Ambulatory Visit (INDEPENDENT_AMBULATORY_CARE_PROVIDER_SITE_OTHER): Payer: BLUE CROSS/BLUE SHIELD

## 2018-07-07 ENCOUNTER — Ambulatory Visit: Payer: BLUE CROSS/BLUE SHIELD | Admitting: Internal Medicine

## 2018-07-07 VITALS — BP 100/70 | HR 94 | Temp 98.3°F | Ht 69.0 in | Wt 234.0 lb

## 2018-07-07 DIAGNOSIS — I10 Essential (primary) hypertension: Secondary | ICD-10-CM

## 2018-07-07 DIAGNOSIS — Z0001 Encounter for general adult medical examination with abnormal findings: Secondary | ICD-10-CM | POA: Diagnosis not present

## 2018-07-07 DIAGNOSIS — Z114 Encounter for screening for human immunodeficiency virus [HIV]: Secondary | ICD-10-CM | POA: Diagnosis not present

## 2018-07-07 DIAGNOSIS — R739 Hyperglycemia, unspecified: Secondary | ICD-10-CM

## 2018-07-07 DIAGNOSIS — J40 Bronchitis, not specified as acute or chronic: Secondary | ICD-10-CM

## 2018-07-07 DIAGNOSIS — F418 Other specified anxiety disorders: Secondary | ICD-10-CM

## 2018-07-07 DIAGNOSIS — Z125 Encounter for screening for malignant neoplasm of prostate: Secondary | ICD-10-CM | POA: Diagnosis not present

## 2018-07-07 LAB — BASIC METABOLIC PANEL
BUN: 20 mg/dL (ref 6–23)
CO2: 28 mEq/L (ref 19–32)
Calcium: 9.4 mg/dL (ref 8.4–10.5)
Chloride: 99 mEq/L (ref 96–112)
Creatinine, Ser: 1 mg/dL (ref 0.40–1.50)
GFR: 86.54 mL/min (ref >60.00)
Glucose, Bld: 100 mg/dL — ABNORMAL HIGH (ref 70–99)
POTASSIUM: 4.2 meq/L (ref 3.5–5.1)
Sodium: 134 mEq/L — ABNORMAL LOW (ref 135–145)

## 2018-07-07 LAB — URINALYSIS, ROUTINE W REFLEX MICROSCOPIC
BILIRUBIN URINE: NEGATIVE
Hgb urine dipstick: NEGATIVE
KETONES UR: NEGATIVE
Leukocytes, UA: NEGATIVE
Nitrite: NEGATIVE
PH: 6.5 (ref 5.0–8.0)
RBC / HPF: NONE SEEN (ref 0–?)
TOTAL PROTEIN, URINE-UPE24: NEGATIVE
UROBILINOGEN UA: 0.2 (ref 0.0–1.0)
Urine Glucose: NEGATIVE

## 2018-07-07 LAB — CBC WITH DIFFERENTIAL/PLATELET
BASOS PCT: 1.2 % (ref 0.0–3.0)
Basophils Absolute: 0.1 10*3/uL (ref 0.0–0.1)
EOS ABS: 0.2 10*3/uL (ref 0.0–0.7)
Eosinophils Relative: 2.8 % (ref 0.0–5.0)
HCT: 43.4 % (ref 39.0–52.0)
Hemoglobin: 14.6 g/dL (ref 13.0–17.0)
Lymphocytes Relative: 36.3 % (ref 12.0–46.0)
Lymphs Abs: 2.7 10*3/uL (ref 0.7–4.0)
MCHC: 33.7 g/dL (ref 30.0–36.0)
MCV: 96.2 fl (ref 78.0–100.0)
Monocytes Absolute: 0.7 10*3/uL (ref 0.1–1.0)
Monocytes Relative: 9 % (ref 3.0–12.0)
Neutro Abs: 3.7 10*3/uL (ref 1.4–7.7)
Neutrophils Relative %: 50.7 % (ref 43.0–77.0)
Platelets: 234 10*3/uL (ref 150.0–400.0)
RBC: 4.51 Mil/uL (ref 4.22–5.81)
RDW: 14.1 % (ref 11.5–15.5)
WBC: 7.3 10*3/uL (ref 4.0–10.5)

## 2018-07-07 LAB — LIPID PANEL
Cholesterol: 179 mg/dL (ref 0–200)
HDL: 52.1 mg/dL (ref >39.00)
LDL Cholesterol: 104 mg/dL — ABNORMAL HIGH (ref 0–99)
NonHDL: 127.36
Total CHOL/HDL Ratio: 3
Triglycerides: 118 mg/dL (ref 0.0–149.0)
VLDL: 23.6 mg/dL (ref 0.0–40.0)

## 2018-07-07 LAB — HEPATIC FUNCTION PANEL
ALT: 33 U/L (ref 0–53)
AST: 25 U/L (ref 0–37)
Albumin: 4.2 g/dL (ref 3.5–5.2)
Alkaline Phosphatase: 85 U/L (ref 39–117)
BILIRUBIN DIRECT: 0.1 mg/dL (ref 0.0–0.3)
Total Bilirubin: 0.3 mg/dL (ref 0.2–1.2)
Total Protein: 7 g/dL (ref 6.0–8.3)

## 2018-07-07 LAB — TSH: TSH: 0.98 u[IU]/mL (ref 0.35–4.50)

## 2018-07-07 LAB — PSA: PSA: 0.4 ng/mL (ref 0.10–4.00)

## 2018-07-07 LAB — HEMOGLOBIN A1C: Hgb A1c MFr Bld: 6 % (ref 4.6–6.5)

## 2018-07-07 MED ORDER — ALPRAZOLAM 2 MG PO TABS: ORAL_TABLET | ORAL | 5 refills | Status: DC

## 2018-07-07 MED ORDER — DULOXETINE HCL 60 MG PO CPEP: 60.0000 mg | ORAL_CAPSULE | Freq: Two times a day (BID) | ORAL | 3 refills | Status: DC

## 2018-07-07 MED ORDER — ALPRAZOLAM ER 3 MG PO TB24: 3.0000 mg | ORAL_TABLET | Freq: Every morning | ORAL | 5 refills | Status: DC

## 2018-07-07 MED ORDER — LISINOPRIL-HYDROCHLOROTHIAZIDE 20-25 MG PO TABS: 1.0000 | ORAL_TABLET | Freq: Every day | ORAL | 3 refills | Status: DC

## 2018-07-07 MED ORDER — ZOLPIDEM TARTRATE 10 MG PO TABS: ORAL_TABLET | ORAL | 5 refills | Status: DC

## 2018-07-07 NOTE — Assessment & Plan Note (Signed)
Now on PREP with HIV+ partner, ok for standing order for HIV testing q 6 months

## 2018-07-07 NOTE — Assessment & Plan Note (Signed)

## 2018-07-07 NOTE — Assessment & Plan Note (Addendum)
Moderate persistent, for increased cymbalta 60 bid, cont all other tx, declines referral for counseling  In addition to the time spent performing CPE, I spent an additional 25 minutes face to face,in which greater than 50% of this time was spent in counseling and coordination of care for patient's acute illness as documented, including the differential dx, treatment, further evaluation and other management of anxiety with depression, HIV screening, HTN, hyperglycemia, insomnia and cough/bronchitis

## 2018-07-07 NOTE — Progress Notes (Signed)
Subjective:    Patient ID: [name] [name], male    DOB: 1975-11-12, 43 y.o.   MRN: [id number]  HPI  Here for wellness and f/u;  Overall doing ok;  Pt denies Chest pain, worsening SOB, DOE, wheezing, orthopnea, PND, worsening LE edema, palpitations, dizziness or syncope.  Pt denies neurological change such as new headache, facial or extremity weakness.  Pt denies polydipsia, polyuria, or low sugar symptoms. Pt states overall good compliance with treatment and medications, good tolerability, and has been trying to follow appropriate diet.  Pt denies worsening depressive symptoms, suicidal ideation or panic. No fever, night sweats, wt loss, loss of appetite, or other constitutional symptoms.  Pt states good ability with ADL's, has low fall risk, home safety reviewed and adequate, no other significant changes in hearing or vision, and only occasionally active with exercise.  Has had More stress recently at work and partners step son Jerilynn Mages now moved in with them, Denies worsening depressive symptoms, suicidal ideation, or panic Wt Readings from Last 3 Encounters:  07/07/18 234 lb (106.1 kg)  01/07/18 236 lb 12.8 oz (107.4 kg)  12/20/17 235 lb 4 oz (106.7 kg)  Also Here with acute onset mild to mod 1 wk ST, HA, general weakness and malaise, and scant prod cough now incidentally improved today.  Also pt is on Truvada/PREP tx, asks for q 6 mo HIV testing as partner is HIV+;  No other new complaints Past Medical History:  Diagnosis Date  . Anal fissure and fistula    chronic  . Anxiety   . 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 Crohn's disease [facility]) followed by dr Carlean Purl   small and large intestines--  hx bowel resection 02/ 2009--  currently treated w/ humira  . Depression   . Essential hypertension, benign   . History of CMV 10/14/2010   resolved w/ antiviral therapy  . History of kidney stones   . History of resection of small bowel   . Hyperlipidemia   . OSA  (obstructive sleep apnea)    01-05-2018 per pt resolved since tonsils removed   . Vitamin D deficiency    Past Surgical History:  Procedure Laterality Date  . ANAL FISTULOTOMY N/A 01/07/2018   Procedure: ANAL FISTULOTOMY;  Surgeon: Leighton Ruff, MD;  Location: [facility];  Service: General;  Laterality: N/A;  . COLONOSCOPY  last one 04-2017   dr Carlean Purl  . EXPLORATORY LAPAROTOMY W/ ILEOSECECTOMY W/ PRIMARY ANASTAMOSIS  08-05-2007    dr gross  [facility]   crohn's, sbo, perferation's  . GANGLION CYST EXCISION Right 2016   wrist  . HYPOSPADIAS CORRECTION  CHILD  . SPHINCTEROTOMY N/A 01/07/2018   Procedure: CHEMICAL SPHINCTEROTOMY (BOTOX);  Surgeon: Leighton Ruff, MD;  Location: [facility];  Service: General;  Laterality: N/A;  . TONSILLECTOMY Bilateral 09/30/2015   Procedure: BILATERAL TONSILLECTOMY;  Surgeon: Izora Gala, MD;  Location: [place];  Service: ENT;  Laterality: Bilateral;  . UPPER GASTROINTESTINAL ENDOSCOPY  10/15/2010   w/biopsy, mild gastritis and duodenitis    reports that he has never smoked. He has never used smokeless tobacco. He reports current alcohol use of about 3.0 standard drinks of alcohol per week. He reports that he does not use drugs. family history includes Allergies in his mother and sister; Asthma in his sister; Cancer in his mother; Gout in his father; Heart disease in his father; Hyperlipidemia in his father; Hypertension in his father; Obesity in his mother; Sleep apnea  in his father; Stroke in his maternal grandfather. No Known Allergies Current Outpatient Medications on File Prior to Visit  Medication Sig Dispense Refill  . acetaminophen (TYLENOL) 325 MG tablet Take 650 mg by mouth every 6 (six) hours as needed for moderate pain.    Marland Kitchen acidophilus (RISAQUAD) CAPS capsule Take 1 capsule by mouth daily.    . Adalimumab (HUMIRA PEN) 40 MG/0.4ML PNKT Inject 40 mg into the skin once a week. 12 each 3  . colestipol (COLESTID) 1 g tablet  TAKE 5 TABLETS BY MOUTH DAILY (Patient taking differently: TAKE 5 TABLETS BY MOUTH DAILY--- takes in evening) 450 tablet 3  . diphenoxylate-atropine (LOMOTIL) 2.5-0.025 MG tablet TAKE 1 TABLET BY MOUTH FOUR TIMES A DAY AS NEEDED FOR DIARRHEA / LOOSE STOOLS (Patient taking differently: TAKE 1 TABLET BY MOUTH FOUR TIMES A DAY AS NEEDED FOR DIARRHEA / LOOSE STOOLS--- per pt takes every am and prn) 90 tablet 1  . loratadine (CLARITIN) 10 MG tablet Take 10 mg by mouth every morning.     . ondansetron (ZOFRAN) 4 MG tablet TAKE 1 TAB BY MOUTH EVERY 6 HOURS 20 tablet 2  . TRUVADA 200-300 MG tablet TAKE 1 TABLET BY MOUTH EVERY DAY 90 tablet 1   No current facility-administered medications on file prior to visit.    Review of Systems  Constitutional: Negative for other unusual diaphoresis or sweats HENT: Negative for ear discharge or swelling Eyes: Negative for other worsening visual disturbances Respiratory: Negative for stridor or other swelling  Gastrointestinal: Negative for worsening distension or other blood Genitourinary: Negative for retention or other urinary change Musculoskeletal: Negative for other MSK pain or swelling Skin: Negative for color change or other new lesions Neurological: Negative for worsening tremors and other numbness  Psychiatric/Behavioral: Negative for worsening agitation or other fatigue All other system neg per pt    Objective:   Physical Exam BP 100/70 (BP Location: Left Arm, Patient Position: Sitting, Cuff Size: Large)   Pulse 94   Temp 98.3 F (36.8 C) (Oral)   Ht 5' 9"  (1.753 m)   Wt 234 lb (106.1 kg)   SpO2 94%   BMI 34.56 kg/m  VS noted, not ill appearing Constitutional: Pt is oriented to person, place, and time. Appears well-developed and well-nourished, in no significant distress and comfortable Head: Normocephalic and atraumatic  Bilat tm's with mild erythema.  Max sinus areas non tender.  Pharynx with mild erythema, no exudateEyes: Conjunctivae and  EOM are normal. Pupils are equal, round, and reactive to light Right Ear: External ear normal without discharge Left Ear: External ear normal without discharge Nose: Nose without discharge or deformity Mouth/Throat: Oropharynx is without other ulcerations and moist  Neck: Normal range of motion. Neck supple. No JVD present. No tracheal deviation present or significant neck LA or mass Cardiovascular: Normal rate, regular rhythm, normal heart sounds and intact distal pulses.   Pulmonary/Chest: WOB normal and breath sounds without rales or wheezing  Abdominal: Soft. Bowel sounds are normal. NT. No HSM  Musculoskeletal: Normal range of motion. Exhibits no edema Lymphadenopathy: Has no other cervical adenopathy.  Neurological: Pt is alert and oriented to person, place, and time. Pt has normal reflexes. No cranial nerve deficit. Motor grossly intact, Gait intact Skin: Skin is warm and dry. No rash noted or new ulcerations Psychiatric:  Has anxious depressed mood and affect. Behavior is normal without agitation No other exam findings Lab Results  Component Value Date   WBC 7.3 07/07/2018   HGB  14.6 07/07/2018   HCT 43.4 07/07/2018   PLT 234.0 07/07/2018   GLUCOSE 100 (H) 07/07/2018   CHOL 179 07/07/2018   TRIG 118.0 07/07/2018   HDL 52.10 07/07/2018   LDLCALC 104 (H) 07/07/2018   ALT 33 07/07/2018   AST 25 07/07/2018   NA 134 (L) 07/07/2018   K 4.2 07/07/2018   CL 99 07/07/2018   CREATININE 1.00 07/07/2018   BUN 20 07/07/2018   CO2 28 07/07/2018   TSH 0.98 07/07/2018   PSA 0.40 07/07/2018   HGBA1C 6.0 07/07/2018       Assessment & Plan:

## 2018-07-07 NOTE — Assessment & Plan Note (Signed)
stable overall by history and exam, recent data reviewed with pt, and pt to continue medical treatment as before,  to f/u any worsening symptoms or concerns  

## 2018-07-07 NOTE — Assessment & Plan Note (Signed)
Resolving, suspect viral related, for delsym otc prn cough

## 2018-07-07 NOTE — Patient Instructions (Signed)
Ok to take the Delsym OTC for cough as needed  OK to increase the cymbalta to 60 mg twice per day  Please continue all other medications as before, and refills have been done if requested.  Please have the pharmacy call with any other refills you may need.  Please continue your efforts at being more active, low cholesterol diet, and weight control.  You are otherwise up to date with prevention measures today.  Please keep your appointments with your specialists as you may have planned  Please return every 6 mo for HIV testing (standing order is in effect)  Please go to the LAB in the Basement (turn left off the elevator) for the tests to be done today  You will be contacted by phone if any changes need to be made immediately.  Otherwise, you will receive a letter about your results with an explanation, but please check with MyChart first.  Please remember to sign up for MyChart if you have not done so, as this will be important to you in the future with finding out test results, communicating by private email, and scheduling acute appointments online when needed.  Please return in 1 year for your yearly visit, or sooner if needed, with Lab testing done 3-5 days before

## 2018-07-08 LAB — HIV ANTIBODY (ROUTINE TESTING W REFLEX): HIV: NONREACTIVE

## 2018-07-13 ENCOUNTER — Encounter: Payer: Self-pay | Admitting: Internal Medicine

## 2018-07-13 MED ORDER — HYDROCODONE-HOMATROPINE 5-1.5 MG/5ML PO SYRP: 5.0000 mL | ORAL_SOLUTION | Freq: Four times a day (QID) | ORAL | 0 refills | Status: AC | PRN

## 2018-07-13 NOTE — Telephone Encounter (Signed)
Cough med done erx

## 2018-07-18 ENCOUNTER — Encounter: Payer: Self-pay | Admitting: Nurse Practitioner

## 2018-07-18 ENCOUNTER — Other Ambulatory Visit: Payer: Self-pay | Admitting: Internal Medicine

## 2018-07-18 ENCOUNTER — Ambulatory Visit (INDEPENDENT_AMBULATORY_CARE_PROVIDER_SITE_OTHER)
Admission: RE | Admit: 2018-07-18 | Discharge: 2018-07-18 | Disposition: A | Discharge status: Home / Self Care | Payer: BLUE CROSS/BLUE SHIELD | Source: Ambulatory Visit | Attending: Nurse Practitioner | Admitting: Nurse Practitioner

## 2018-07-18 ENCOUNTER — Other Ambulatory Visit: Payer: Self-pay

## 2018-07-18 ENCOUNTER — Ambulatory Visit: Payer: BLUE CROSS/BLUE SHIELD | Admitting: Nurse Practitioner

## 2018-07-18 ENCOUNTER — Ambulatory Visit: Payer: Self-pay

## 2018-07-18 VITALS — BP 96/60 | HR 92 | Temp 98.4°F | Ht 69.0 in | Wt 237.0 lb

## 2018-07-18 DIAGNOSIS — R0789 Other chest pain: Secondary | ICD-10-CM | POA: Diagnosis not present

## 2018-07-18 DIAGNOSIS — R05 Cough: Secondary | ICD-10-CM | POA: Diagnosis not present

## 2018-07-18 DIAGNOSIS — G479 Sleep disorder, unspecified: Secondary | ICD-10-CM

## 2018-07-18 DIAGNOSIS — R059 Cough, unspecified: Secondary | ICD-10-CM

## 2018-07-18 MED ORDER — PREDNISONE 20 MG PO TABS: 40.0000 mg | ORAL_TABLET | Freq: Every day | ORAL | 0 refills | Status: DC

## 2018-07-18 MED ORDER — AZITHROMYCIN 250 MG PO TABS: ORAL_TABLET | ORAL | 0 refills | Status: DC

## 2018-07-18 NOTE — Telephone Encounter (Signed)
Pt. Reports he has had a cough since 06/29/18.Treated in [place]. Saw Dr. Jenny Reichmann 07/07/18 as well. Hydrocodone cough "is not helping - I'm up all night coughing." Mostly non-productive. Has wheezing. States he was given an inhaler and "it is almost out. That's the only thing that helps my chest tightness." States he is exhausted. No availability with Dr. Jenny Reichmann. Appointment made for today.  Reason for Disposition . SEVERE coughing spells (e.g., whooping sound after coughing, vomiting after coughing)  Answer Assessment - Initial Assessment Questions 1. ONSET: "When did the cough begin?"      Started beginning of Jan. 2. SEVERITY: "How bad is the cough today?"      Severe 3. RESPIRATORY DISTRESS: "Describe your breathing."      Using his inhaler - chest feels tight 4. FEVER: "Do you have a fever?" If so, ask: "What is your temperature, how was it measured, and when did it start?"     No 5. HEMOPTYSIS: "Are you coughing up any blood?" If so ask: "How much?" (flecks, streaks, tablespoons, etc.)     No 6. TREATMENT: "What have you done so far to treat the cough?" (e.g., meds, fluids, humidifier)     Hydrocodone cough syrup 7. CARDIAC HISTORY: "Do you have any history of heart disease?" (e.g., heart attack, congestive heart failure)      HTN 8. LUNG HISTORY: "Do you have any history of lung disease?"  (e.g., pulmonary embolus, asthma, emphysema)     No 9. PE RISK FACTORS: "Do you have a history of blood clots?" (or: recent major surgery, recent prolonged travel, bedridden)     No 10. OTHER SYMPTOMS: "Do you have any other symptoms? (e.g., runny nose, wheezing, chest pain)       Wheezing 11. PREGNANCY: "Is there any chance you are pregnant?" "When was your last menstrual period?"       n/a 12. TRAVEL: "Have you traveled out of the country in the last month?" (e.g., travel history, exposures)       No  Protocols used: COUGH - ACUTE NON-PRODUCTIVE-A-AH

## 2018-07-18 NOTE — Patient Instructions (Signed)
Complete prednisone, zpak  You will be contacted about pulmonology referral  Please follow up for fevers over 101, if your symptoms get worse, or if your symptoms dont improve with medications   Cough, Adult  A cough helps to clear your throat and lungs. A cough may last only 2-3 weeks (acute), or it may last longer than 8 weeks (chronic). Many different things can cause a cough. A cough may be a sign of an illness or another medical condition. Follow these instructions at home:  Pay attention to any changes in your cough.  Take medicines only as told by your doctor. ? If you were prescribed an antibiotic medicine, take it as told by your doctor. Do not stop taking it even if you start to feel better. ? Talk with your doctor before you try using a cough medicine.  Drink enough fluid to keep your pee (urine) clear or pale yellow.  If the air is dry, use a cold steam vaporizer or humidifier in your home.  Stay away from things that make you cough at work or at home.  If your cough is worse at night, try using extra pillows to raise your head up higher while you sleep.  Do not smoke, and try not to be around smoke. If you need help quitting, ask your doctor.  Do not have caffeine.  Do not drink alcohol.  Rest as needed. Contact a doctor if:  You have new problems (symptoms).  You cough up yellow fluid (pus).  Your cough does not get better after 2-3 weeks, or your cough gets worse.  Medicine does not help your cough and you are not sleeping well.  You have pain that gets worse or pain that is not helped with medicine.  You have a fever.  You are losing weight and you do not know why.  You have night sweats. Get help right away if:  You cough up blood.  You have trouble breathing.  Your heartbeat is very fast. This information is not intended to replace advice given to you by your health care provider. Make sure you discuss any questions you have with your health  care provider. Document Released: 02/19/2011 Document Revised: 11/14/2015 Document Reviewed: 08/15/2014 Elsevier Interactive Patient Education  2019 [employer].

## 2018-07-18 NOTE — Progress Notes (Signed)
[name] [name] is a 43 y.o. male with the following history as recorded in EpicCare:  Patient Active Problem List   Diagnosis Date Noted  . Encounter for screening for HIV 07/07/2018  . Hyperglycemia 07/07/2018  . Bronchitis 06/29/2018  . 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 Perianal dermatitis 10/26/2017  . Encounter for well adult exam with abnormal findings 05/10/2017  . High risk sexual behavior 06/30/2016  . Folliculitis 24/82/5003  . Ganglion cyst of flexor tendon sheath 11/02/2014  . Chronic posterior anal fissure 02/15/2014  . Tinea pedis 06/27/2013  . Long-term use of immunosuppressant medication - Humira 10/14/2010  . Obesity, unspecified 07/20/2007  . Anxiety with depression 07/20/2007  . Insomnia 07/20/2007  . Essential hypertension, benign 07/20/2007  . CROHN'S DISEASE, LARGE AND SMALL INTESTINES 07/20/2007    Current Outpatient Medications  Medication Sig Dispense Refill  . acetaminophen (TYLENOL) 325 MG tablet Take 650 mg by mouth every 6 (six) hours as needed for moderate pain.    Marland Kitchen acidophilus (RISAQUAD) CAPS capsule Take 1 capsule by mouth daily.    . Adalimumab (HUMIRA PEN) 40 MG/0.4ML PNKT Inject 40 mg into the skin once a week. 12 each 3  . ALPRAZolam (XANAX XR) 3 MG 24 hr tablet Take 1 tablet (3 mg total) by mouth every morning. 30 tablet 5  . alprazolam (XANAX) 2 MG tablet TAKE 1/2 TAB BY MOUTH TWICE DAILY AS NEEDED 30 tablet 5  . colestipol (COLESTID) 1 g tablet TAKE 5 TABLETS BY MOUTH DAILY (Patient taking differently: TAKE 5 TABLETS BY MOUTH DAILY--- takes in evening) 450 tablet 3  . diphenoxylate-atropine (LOMOTIL) 2.5-0.025 MG tablet TAKE 1 TABLET BY MOUTH FOUR TIMES A DAY AS NEEDED FOR DIARRHEA / LOOSE STOOLS (Patient taking differently: TAKE 1 TABLET BY MOUTH FOUR TIMES A DAY AS NEEDED FOR DIARRHEA / LOOSE STOOLS--- per pt takes every am and prn) 90 tablet 1  . DULoxetine (CYMBALTA) 60 MG capsule Take 1 capsule (60 mg  total) by mouth 2 (two) times daily. 180 capsule 3  . HYDROcodone-homatropine (HYCODAN) 5-1.5 MG/5ML syrup Take 5 mLs by mouth every 6 (six) hours as needed for up to 10 days for cough. 180 mL 0  . lisinopril-hydrochlorothiazide (PRINZIDE,ZESTORETIC) 20-25 MG tablet Take 1 tablet by mouth daily. 90 tablet 3  . loratadine (CLARITIN) 10 MG tablet Take 10 mg by mouth every morning.     . ondansetron (ZOFRAN) 4 MG tablet TAKE 1 TAB BY MOUTH EVERY 6 HOURS 20 tablet 2  . TRUVADA 200-300 MG tablet TAKE 1 TABLET BY MOUTH EVERY DAY 90 tablet 1  . zolpidem (AMBIEN) 10 MG tablet TAKE 1 TABLET (10 MG TOTAL) BY MOUTH AT BEDTIME AS NEEDED FOR SLEEP 30 tablet 5  . azithromycin (ZITHROMAX) 250 MG tablet Take 2 tablets today, then 1 tablet daily until complete 6 tablet 0  . predniSONE (DELTASONE) 20 MG tablet Take 2 tablets (40 mg total) by mouth daily with breakfast. 10 tablet 0   No current facility-administered medications for this visit.     Allergies: Patient has no known allergies.  Past Medical History:  Diagnosis Date  . Anal fissure and fistula    chronic  . Anxiety   . 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 Crohn's disease [facility]) followed by dr Carlean Purl   small and large intestines--  hx bowel resection 02/ 2009--  currently treated w/ humira  . Depression   . Essential hypertension, benign   . History  of CMV 10/14/2010   resolved w/ antiviral therapy  . History of kidney stones   . History of resection of small bowel   . Hyperlipidemia   . OSA (obstructive sleep apnea)    01-05-2018 per pt resolved since tonsils removed   . Vitamin D deficiency     Past Surgical History:  Procedure Laterality Date  . ANAL FISTULOTOMY N/A 01/07/2018   Procedure: ANAL FISTULOTOMY;  Surgeon: Leighton Ruff, MD;  Location: [facility];  Service: General;  Laterality: N/A;  . COLONOSCOPY  last one 04-2017   dr Carlean Purl  . EXPLORATORY LAPAROTOMY W/ ILEOSECECTOMY W/  PRIMARY ANASTAMOSIS  08-05-2007    dr gross  [facility]   crohn's, sbo, perferation's  . GANGLION CYST EXCISION Right 2016   wrist  . HYPOSPADIAS CORRECTION  CHILD  . SPHINCTEROTOMY N/A 01/07/2018   Procedure: CHEMICAL SPHINCTEROTOMY (BOTOX);  Surgeon: Leighton Ruff, MD;  Location: [facility];  Service: General;  Laterality: N/A;  . TONSILLECTOMY Bilateral 09/30/2015   Procedure: BILATERAL TONSILLECTOMY;  Surgeon: Izora Gala, MD;  Location: [place];  Service: ENT;  Laterality: Bilateral;  . UPPER GASTROINTESTINAL ENDOSCOPY  10/15/2010   w/biopsy, mild gastritis and duodenitis    Family History  Problem Relation Age of Onset  . Heart disease Father        CAD/MI  . Hyperlipidemia Father   . Hypertension Father   . Gout Father   . Sleep apnea Father   . Obesity Mother   . Allergies Mother   . Cancer Mother   . Stroke Maternal Grandfather   . Allergies Sister   . Asthma Sister   . Colon cancer Neg Hx   . Diabetes Neg Hx   . COPD Neg Hx   . Colon polyps Neg Hx     Social History   Tobacco Use  . Smoking status: Never Smoker  . Smokeless tobacco: Never Used  Substance Use Topics  . Alcohol use: Yes    Alcohol/week: 3.0 standard drinks    Types: 3 Shots of liquor per week     Subjective:  [name] [name] is here today requesting evaluation of acute complaint of cough/cold symptoms, which started around the beginning of January, he was actually seen in ED on 06/29/18 and diagnosed with bronchitis, given albuterol PRN, doxycycline course. He completed doxycyline and his symptoms seemed better for a short time, but over past week hes had return of productive cough, waking up at night coughing, has called his PCP and sent hycodan, mucinex DM but cough is not better and also having fevers, chills, body aches, shortness of breath, wheezing again. He also says his fitbit has been showing hes not sleeping well for some time, "bad stats" at night per fitbit sleep monitor. Denies: syncope,  confusion, sinus pain, nasal congestion, rhinorrhea, heartburn, abdominal pain, nausea, vomiting, diarrhea Smoker? never  ROS- See HPI  Objective:  Vitals:   07/18/18 1539  BP: 96/60  Pulse: 92  Temp: 98.4 F (36.9 C)  TempSrc: Oral  SpO2: 97%  Weight: 237 lb (107.5 kg)  Height: 5' 9"  (1.753 m)    General: Well developed, well nourished, in no acute distress  Skin : Warm and dry.  Head: Normocephalic and atraumatic  Eyes: Sclera and conjunctiva clear; pupils round and reactive to light; extraocular movements intact  Ears: External normal; canals clear; tympanic membranes normal  Nose: No sinus tenderness. Oropharynx: Mild posterior oropharyngeal erythema, no edema or exudate . No suspicious lesions  Neck: Supple without thyromegaly, adenopathy  Lungs: Respirations unlabored; mild wheezing to lower lobes CVS exam: normal rate and regular rhythm, S1 and S2 normal.  Extremities: No edema, cyanosis, clubbing  Vessels: Symmetric bilaterally  Neurologic: Alert and oriented; speech intact; face symmetrical; moves all extremities well; CNII-XII intact without focal deficit  Psychiatric: Normal mood and affect.  Assessment:  1. Cough   2. Sleep disturbance     Plan:   Stat Chest xray today is normal Will prescribe antibiotic/steroid course for recurrent symptoms. Zpak, prednisone sent- medication dosing, side effects discussed Referral to pulmonology to consider sleep study Home management, red flags and return precautions including when to seek immediate care discussed and printed on AVS- he will f/u for new, worsening symptoms or if symptoms no better after antibiotic/steroid course     No follow-ups on file.  Orders Placed This Encounter  Procedures  . DG Chest 2 View    Standing Status:   Future    Number of Occurrences:   1    Standing Expiration Date:   09/16/2019    Order Specific Question:   Reason for Exam (SYMPTOM  OR DIAGNOSIS REQUIRED)    Answer:   recrruent  cough, fevers    Order Specific Question:   Preferred imaging location?    Answer:   [facility]    Order Specific Question:   Radiology Contrast Protocol - do NOT remove file path    Answer:   [email or web address]  . Ambulatory referral to Pulmonology    Referral Priority:   Routine    Referral Type:   Consultation    Referral Reason:   Specialty Services Required    Requested Specialty:   Pulmonary Disease    Number of Visits Requested:   1    Requested Prescriptions   Signed Prescriptions Disp Refills  . azithromycin (ZITHROMAX) 250 MG tablet 6 tablet 0    Sig: Take 2 tablets today, then 1 tablet daily until complete  . predniSONE (DELTASONE) 20 MG tablet 10 tablet 0    Sig: Take 2 tablets (40 mg total) by mouth daily with breakfast.

## 2018-08-04 ENCOUNTER — Other Ambulatory Visit: Payer: Self-pay | Admitting: Internal Medicine

## 2018-08-04 NOTE — Telephone Encounter (Signed)
Unfortunately this is not a medication I normally prescribe and is outside my scope of practice, I can refer to Infectiou Disease  If needed

## 2018-08-26 ENCOUNTER — Encounter: Payer: Self-pay | Admitting: Pulmonary Disease

## 2018-08-26 ENCOUNTER — Ambulatory Visit (INDEPENDENT_AMBULATORY_CARE_PROVIDER_SITE_OTHER): Payer: BLUE CROSS/BLUE SHIELD | Admitting: Pulmonary Disease

## 2018-08-26 DIAGNOSIS — G4733 Obstructive sleep apnea (adult) (pediatric): Secondary | ICD-10-CM

## 2018-08-26 DIAGNOSIS — F5104 Psychophysiologic insomnia: Secondary | ICD-10-CM

## 2018-08-26 DIAGNOSIS — J40 Bronchitis, not specified as acute or chronic: Secondary | ICD-10-CM | POA: Diagnosis not present

## 2018-08-26 NOTE — Progress Notes (Signed)
   Subjective:    Patient ID: [name] [name], male    DOB: June 16, 1976, 43 y.o.   MRN: [id number]  HPI    Review of Systems  Constitutional: Negative for fever and unexpected weight change.  HENT: Positive for dental problem. Negative for congestion, ear pain, nosebleeds, postnasal drip, rhinorrhea, sinus pressure, sneezing, sore throat and trouble swallowing.   Eyes: Negative for redness and itching.  Respiratory: Positive for cough, chest tightness and shortness of breath. Negative for wheezing.   Cardiovascular: Negative for palpitations and leg swelling.  Gastrointestinal: Positive for abdominal pain. Negative for nausea and vomiting.  Genitourinary: Negative for dysuria.  Musculoskeletal: Negative for joint swelling.  Skin: Negative for rash.  Allergic/Immunologic: Negative.  Negative for environmental allergies, food allergies and immunocompromised state.  Neurological: Negative for headaches.  Hematological: Does not bruise/bleed easily.  Psychiatric/Behavioral: Positive for dysphoric mood. The patient is nervous/anxious.        Objective:   Physical Exam        Assessment & Plan:

## 2018-08-26 NOTE — Assessment & Plan Note (Signed)
Advise trying to come off Ambien and try melatonin instead

## 2018-08-26 NOTE — Progress Notes (Signed)
Subjective:    Patient ID: [name] [name], male    DOB: 07/22/1975, 43 y.o.   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 He reports onset of cough about 2 months ago with URI symptoms at onset.  He got 2 rounds of antibiotics, initially Z-Pak and then Cipro and then a round of prednisone. Chest x-ray was obtained which I reviewed which did not show any infiltrates or effusions, does have prominent interstitium which is similar to his x-ray from 2019 and 2016. CT abdomen from 07/2017 was also reviewed which does not show any infiltrates in the lung bases.  He reports cough productive of clear sputum especially in the mornings with an occasional wheeze, denies dyspnea on exertion. He admits to sedentary lifestyle, denies thyroid history of asthma.  He is also here for evaluation of OSA.  OSA was diagnosed initially in 2009 and he used CPAP for a little while and does not want to use this again.  He was retested in 2012 and was found to have mild OSA and was advised weight loss.  He is been unable to lose weight, has in fact gained 14 pounds from 2 1 8  pounds in 2012 to 232 pounds now.  He did have his tonsils taken out in 2017 but has not been retested after that. He reports sleep onset insomnia and has been taking Ambien for more than 8 months, for the last 2 weeks he started taking melatonin Gummies and this seems to have helped. Epworth sleepiness score is 11 and he reports some sleepiness while watching TV, sitting and reading or as a passenger in a car. He takes occasional nap at work for 15 minutes in his chair and this is refreshing. Bedtime is around 10 PM, sleep latency is variable, he sleeps on his side with 2-3 pillows, reports 2-4 nocturnal awakenings including nocturia and is out of bed by 6 AM feeling tired with dryness of mouth.  Medication shows lisinopril. He denies postnasal drip but  reports GERD symptoms once a week.  Past medical history of Crohn's disease and his partner is HIV, he gets tested every 3 to 6 months and takes Truvada for prophylaxis   Significant tests/ events reviewed  PSG 11/30/10>>AHI 14, SpO2 low 87%.  CPAP 12 cm H2O.    Past Medical History:  Diagnosis Date  . Anal fissure and fistula    chronic  . Anxiety   . 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 Crohn's disease [facility]) followed by dr Carlean Purl   small and large intestines--  hx bowel resection 02/ 2009--  currently treated w/ humira  . Depression   . Essential hypertension, benign   . History of CMV 10/14/2010   resolved w/ antiviral therapy  . History of kidney stones   . History of resection of small bowel   . Hyperlipidemia   . OSA (obstructive sleep apnea)    01-05-2018 per pt resolved since tonsils removed   . Vitamin D deficiency     Past Surgical History:  Procedure Laterality Date  . ANAL FISTULOTOMY N/A 01/07/2018   Procedure: ANAL FISTULOTOMY;  Surgeon: Leighton Ruff, MD;  Location: [facility];  Service: General;  Laterality: N/A;  . COLONOSCOPY  last one 04-2017   dr Carlean Purl  . EXPLORATORY LAPAROTOMY W/ ILEOSECECTOMY W/ PRIMARY ANASTAMOSIS  08-05-2007    dr gross  [facility]   crohn's, sbo, perferation's  .  GANGLION CYST EXCISION Right 2016   wrist  . HYPOSPADIAS CORRECTION  CHILD  . SPHINCTEROTOMY N/A 01/07/2018   Procedure: CHEMICAL SPHINCTEROTOMY (BOTOX);  Surgeon: Leighton Ruff, MD;  Location: [facility];  Service: General;  Laterality: N/A;  . TONSILLECTOMY Bilateral 09/30/2015   Procedure: BILATERAL TONSILLECTOMY;  Surgeon: Izora Gala, MD;  Location: [place];  Service: ENT;  Laterality: Bilateral;  . UPPER GASTROINTESTINAL ENDOSCOPY  10/15/2010   w/biopsy, mild gastritis and duodenitis    No Known Allergies Social History   Socioeconomic History  . Marital status: Divorced    Spouse name: Not on file  .  Number of children: 2  . Years of education: 20  . Highest education level: Not on file  Occupational History  . Occupation: Librarian, academic: [employer]  Social Needs  . Financial resource strain: Not on file  . Food insecurity:    Worry: Not on file    Inability: Not on file  . Transportation needs:    Medical: Not on file    Non-medical: Not on file  Tobacco Use  . Smoking status: Never Smoker  . Smokeless tobacco: Never Used  Substance and Sexual Activity  . Alcohol use: Yes    Alcohol/week: 3.0 standard drinks    Types: 3 Shots of liquor per week  . Drug use: No  . Sexual activity: Yes    Partners: Male    Comment: Truvada protection  Lifestyle  . Physical activity:    Days per week: Not on file    Minutes per session: Not on file  . Stress: Not on file  Relationships  . Social connections:    Talks on phone: Not on file    Gets together: Not on file    Attends religious service: Not on file    Active member of club or organization: Not on file    Attends meetings of clubs or organizations: Not on file    Relationship status: Not on file  . Intimate partner violence:    Fear of current or ex partner: Not on file    Emotionally abused: Not on file    Physically abused: Not on file    Forced sexual activity: Not on file  Other Topics Concern  . Not on file  Social History Narrative   HSG, [employer] - 2 years. Married '99 - 54yr/divorced. 2 sons - '98, '00 split custody. 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 No history of physical or sexual abuse.    [employer]     Family History  Problem Relation Age of Onset  . Heart disease Father        CAD/MI  . Hyperlipidemia Father   . Hypertension Father   . Gout Father   . Sleep apnea Father   . Obesity Mother   . Allergies Mother   . Cancer Mother   . Stroke Maternal Grandfather   . Allergies Sister   . Asthma Sister   . Colon cancer Neg Hx   . Diabetes Neg Hx     . COPD Neg Hx   . Colon polyps Neg Hx      Review of Systems   Constitutional: negative for anorexia, fevers and sweats  Eyes: negative for irritation, redness and visual disturbance  Ears, nose, mouth, throat, and face: negative for earaches, epistaxis, nasal congestion and sore throat  Respiratory: negative for dyspnea on exertion Cardiovascular: negative for chest pain, dyspnea,  lower extremity edema, orthopnea, palpitations and syncope  Gastrointestinal: negative for abdominal pain, constipation, diarrhea, melena, nausea and vomiting  Genitourinary:negative for dysuria, frequency and hematuria  Hematologic/lymphatic: negative for bleeding, easy bruising and lymphadenopathy  Musculoskeletal:negative for arthralgias, muscle weakness and stiff joints  Neurological: negative for coordination problems, gait problems, headaches and weakness  Endocrine: negative for diabetic symptoms including polydipsia, polyuria and weight loss     Objective:   Physical Exam  Gen. Pleasant, obese, in no distress, normal affect ENT - no pallor,icterus, no post nasal drip, class 2-3 airway, nml bite Neck: No JVD, no thyromegaly, no carotid bruits Lungs: no use of accessory muscles, no dullness to percussion, decreased without rales or rhonchi  Cardiovascular: Rhythm regular, heart sounds  normal, no murmurs or gallops, no peripheral edema Abdomen: soft and non-tender, no hepatosplenomegaly, BS normal. Musculoskeletal: No deformities, no cyanosis or clubbing Neuro:  alert, non focal, no tremors       Assessment & Plan:

## 2018-08-26 NOTE — Assessment & Plan Note (Signed)
We discussed that you likely have a post bronchitic cough-which should resolve over the next month. Confounding factors are reflux and lisinopril which can cause a cough. Let us know if this gets worse or does not go away in another month.

## 2018-08-26 NOTE — Assessment & Plan Note (Signed)
We discussed finding of mild obstructive sleep apnea in 2012. Please work on weight loss, target 20 pounds and lifestyle changes. Please call me to set up home sleep test if sleepiness or decreased alertness in the daytime   The pathophysiology of obstructive sleep apnea , it's cardiovascular consequences & modes of treatment including CPAP were discused with the patient in detail & they evidenced understanding.  He would like to defer sleep testing for now

## 2018-08-26 NOTE — Patient Instructions (Signed)
We discussed that you likely have a post bronchitic cough-which should resolve over the next month. Confounding factors are reflux and lisinopril which can cause a cough. Let us know if this gets worse or does not go away in another month.  We discussed finding of mild obstructive sleep apnea in 2012. Please work on weight loss, target 20 pounds and lifestyle changes. Please call me to set up home sleep test if sleepiness or decreased alertness in the daytime

## 2018-09-02 ENCOUNTER — Telehealth: Payer: Self-pay | Admitting: Internal Medicine

## 2018-09-02 NOTE — Telephone Encounter (Signed)
Left message for patient to call back  

## 2018-09-02 NOTE — Telephone Encounter (Signed)
Pt stated that he went to take his humira it rolled off the counter it did not break but when he went to open the medication shot out the top of the top. He is wanting to know if he can get an extra bottle to replace that. Please contact him back at work. [phone]

## 2018-09-02 NOTE — Telephone Encounter (Signed)
Patient asked to reach out to his ambassador and they will send me a authorization for a replacement .

## 2018-09-11 ENCOUNTER — Other Ambulatory Visit: Payer: Self-pay | Admitting: Internal Medicine

## 2018-11-16 ENCOUNTER — Telehealth: Payer: Self-pay

## 2018-11-16 NOTE — Telephone Encounter (Signed)
  Left message on his work # to call me back so I can pre-screen him for his in person visit tomorrow with Dr Carlean Purl.

## 2018-11-17 ENCOUNTER — Other Ambulatory Visit: Payer: Self-pay

## 2018-11-17 ENCOUNTER — Ambulatory Visit (INDEPENDENT_AMBULATORY_CARE_PROVIDER_SITE_OTHER): Payer: BLUE CROSS/BLUE SHIELD | Admitting: Internal Medicine

## 2018-11-17 ENCOUNTER — Encounter: Payer: Self-pay | Admitting: Internal Medicine

## 2018-11-17 ENCOUNTER — Other Ambulatory Visit: Payer: BLUE CROSS/BLUE SHIELD

## 2018-11-17 VITALS — BP 120/60 | HR 93 | Temp 98.2°F | Ht 69.0 in | Wt 237.0 lb

## 2018-11-17 DIAGNOSIS — Z79899 Other long term (current) drug therapy: Secondary | ICD-10-CM | POA: Diagnosis not present

## 2018-11-17 DIAGNOSIS — Z7960 Long term (current) use of unspecified immunomodulators and immunosuppressants: Secondary | ICD-10-CM

## 2018-11-17 DIAGNOSIS — K601 Chronic anal fissure: Secondary | ICD-10-CM

## 2018-11-17 DIAGNOSIS — K9089 Other intestinal malabsorption: Secondary | ICD-10-CM

## 2018-11-17 DIAGNOSIS — K50818 Crohn's disease of both small and large intestine with other complication: Secondary | ICD-10-CM | POA: Diagnosis not present

## 2018-11-17 DIAGNOSIS — K5080 Crohn's disease of both small and large intestine without complications: Secondary | ICD-10-CM | POA: Diagnosis not present

## 2018-11-17 MED ORDER — ONDANSETRON HCL 4 MG PO TABS: ORAL_TABLET | ORAL | 1 refills | Status: DC

## 2018-11-17 MED ORDER — DIPHENOXYLATE-ATROPINE 2.5-0.025 MG PO TABS: ORAL_TABLET | ORAL | 1 refills | Status: DC

## 2018-11-17 NOTE — Assessment & Plan Note (Addendum)
Other than the fissure seems ok at this time.  Continue Humira.  Return to clinic routinely in 6 to 12 months.  Sooner as needed.  Refill Lomotil and Zofran.

## 2018-11-17 NOTE — Telephone Encounter (Signed)
Covid-19 travel screening questions  Have you traveled in the last 14 days? no If yes where?  Do you now or have you had a fever in the last 14 days? no  Do you have any respiratory symptoms of shortness of breath or cough now or in the last 14 days? no  Do you have a medical history of Congestive Heart Failure? no  Do you have a medical history of lung disease? no  Do you have any family members or close contacts with diagnosed or suspected Covid-19? no       

## 2018-11-17 NOTE — Assessment & Plan Note (Signed)
This appears to be recurring.  We talked about the options of trying diltiazem and lidocaine ointment again versus being evaluated by Dr. Marcello Moores.  He prefers to have her evaluate him and determine medical versus surgical therapy, question repeat Botox injection, question could he need a lateral internal sphincterotomy.  Perhaps there is more than a fissure going on as well.  He could need an exam under anesthesia.  He will be referred to Dr. Marcello Moores.

## 2018-11-17 NOTE — Assessment & Plan Note (Signed)
Doing well on bile salt binder.

## 2018-11-17 NOTE — Assessment & Plan Note (Signed)
QuantiFERON will be drawn today.

## 2018-11-17 NOTE — Progress Notes (Signed)
[name] [name] 43 y.o. [birth year] [id number]  Assessment & Plan:  CROHN'S DISEASE, LARGE AND SMALL INTESTINES Other than the fissure seems ok at this time.  Continue Humira.  Return to clinic routinely in 6 to 12 months.  Sooner as needed.  Refill Lomotil and Zofran.   Long-term use of immunosuppressant medication - Humira QuantiFERON will be drawn today.  Chronic posterior anal fissure This appears to be recurring.  We talked about the options of trying diltiazem and lidocaine ointment again versus being evaluated by Dr. Marcello Moores.  He prefers to have her evaluate him and determine medical versus surgical therapy, question repeat Botox injection, question could he need a lateral internal sphincterotomy.  Perhaps there is more than a fissure going on as well.  He could need an exam under anesthesia.  He will be referred to Dr. Marcello Moores.  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  I appreciate the opportunity to care for this patient. Copy to Dr. Leighton Ruff    Subjective:   Chief Complaint: Rectal discharge question recurrent fistula  HPI [name] [name] is here it has been almost 1 year since a visit in the office, complaining of itching and draining and some pain from the rectal area.  He wonders if his fissure or fistula has returned.  His diarrhea is well controlled there is occasional abdominal pain but overall he feels like his Crohn's disease is under good control on his weekly Humira.  He had labs through primary care in January without any significant abnormality of CBC or CMET.  He denies any trauma and there is no receptive anal intercourse occurring.  He said that he had a good response to the Botox injections and fistulectomy that Dr. Marcello Moores performed the past summer, but after 8 to 10 months it seemed like the symptoms started to return.  He says he is having difficulty with cleansing and feels like he has seepage of whitish or  tan-colored mucoid material after defecation.   No Known Allergies Current Meds  Medication Sig  . acetaminophen (TYLENOL) 325 MG tablet Take 650 mg by mouth every 6 (six) hours as needed for moderate pain.  . Adalimumab (HUMIRA PEN) 40 MG/0.4ML PNKT Inject 40 mg into the skin once a week.  . ALPRAZolam (XANAX XR) 3 MG 24 hr tablet Take 1 tablet (3 mg total) by mouth every morning.  Marland Kitchen alprazolam (XANAX) 2 MG tablet TAKE 1/2 TAB BY MOUTH TWICE DAILY AS NEEDED  . cetirizine (ZYRTEC) 10 MG tablet Take 10 mg by mouth daily.  . colestipol (COLESTID) 1 g tablet TAKE 5 TABLETS BY MOUTH DAILY (Patient taking differently: TAKE 5 TABLETS BY MOUTH DAILY--- takes in evening)  . diphenoxylate-atropine (LOMOTIL) 2.5-0.025 MG tablet TAKE 1 TABLET BY MOUTH FOUR TIMES A DAY AS NEEDED FOR DIARRHEA / LOOSE STOOLS  . DULoxetine (CYMBALTA) 60 MG capsule Take 1 capsule (60 mg total) by mouth 2 (two) times daily. (Patient taking differently: Take 120 mg by mouth daily. )  . lisinopril-hydrochlorothiazide (PRINZIDE,ZESTORETIC) 20-25 MG tablet Take 1 tablet by mouth daily.  . ondansetron (ZOFRAN) 4 MG tablet TAKE 1 TAB BY MOUTH EVERY 6 HOURS  . TRUVADA 200-300 MG tablet TAKE 1 TABLET BY MOUTH EVERY DAY  . zolpidem (AMBIEN) 10 MG tablet TAKE 1 TABLET (10 MG TOTAL) BY MOUTH AT BEDTIME AS NEEDED FOR SLEEP  . [DISCONTINUED] diphenoxylate-atropine (LOMOTIL) 2.5-0.025 MG tablet TAKE 1 TABLET BY MOUTH FOUR TIMES A DAY AS NEEDED FOR  DIARRHEA / LOOSE STOOLS (Patient taking differently: TAKE 1 TABLET BY MOUTH FOUR TIMES A DAY AS NEEDED FOR DIARRHEA / LOOSE STOOLS--- per pt takes every am and prn)  . [DISCONTINUED] ondansetron (ZOFRAN) 4 MG tablet TAKE 1 TAB BY MOUTH EVERY 6 HOURS (Patient taking differently: every 8 (eight) hours as needed. )   Past Medical History:  Diagnosis Date  . Anal fissure and fistula    chronic  . Anxiety   . 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 Crohn's disease [facility])  followed by dr Carlean Purl   small and large intestines--  hx bowel resection 02/ 2009--  currently treated w/ humira  . Depression   . Essential hypertension, benign   . History of CMV 10/14/2010   resolved w/ antiviral therapy  . History of kidney stones   . History of resection of small bowel   . Hyperlipidemia   . OSA (obstructive sleep apnea)    01-05-2018 per pt resolved since tonsils removed   . Vitamin D deficiency    Past Surgical History:  Procedure Laterality Date  . ANAL FISTULOTOMY N/A 01/07/2018   Procedure: ANAL FISTULOTOMY;  Surgeon: Leighton Ruff, MD;  Location: [facility];  Service: General;  Laterality: N/A;  . COLONOSCOPY  last one 04-2017   dr Carlean Purl  . EXPLORATORY LAPAROTOMY W/ ILEOSECECTOMY W/ PRIMARY ANASTAMOSIS  08-05-2007    dr gross  [facility]   crohn's, sbo, perferation's  . GANGLION CYST EXCISION Right 2016   wrist  . HYPOSPADIAS CORRECTION  CHILD  . SPHINCTEROTOMY N/A 01/07/2018   Procedure: CHEMICAL SPHINCTEROTOMY (BOTOX);  Surgeon: Leighton Ruff, MD;  Location: [facility];  Service: General;  Laterality: N/A;  . TONSILLECTOMY Bilateral 09/30/2015   Procedure: BILATERAL TONSILLECTOMY;  Surgeon: Izora Gala, MD;  Location: [place];  Service: ENT;  Laterality: Bilateral;  . UPPER GASTROINTESTINAL ENDOSCOPY  10/15/2010   w/biopsy, mild gastritis and duodenitis   Social History   Social History Narrative   HSG, [place] - 2 years. Married '99 - 37yr/divorced. 2 sons - '98, '00 split custody. 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 No history of physical or sexual abuse.    [employer]   family history includes Allergies in his mother and sister; Asthma in his sister; Cancer in his mother; Gout in his father; Heart disease in his father; Hyperlipidemia in his father; Hypertension in his father; Obesity in his mother; Sleep apnea in his father; Stroke in his maternal grandfather.   Review of  Systems See HPI  Objective:   Physical Exam BP 120/60   Pulse 93   Temp 98.2 F (36.8 C)   Ht 5' 9"  (1.753 m)   Wt 237 lb (107.5 kg)   BMI 35.00 kg/m  NAD Lungs cta Cor NL abd obese, soft, NT  Rectal - sl perianal erythema, + tiny posterior sentinel pile, + anal stenosis with mild tenderness worse posterior  Anoscopy - posterior fissure

## 2018-11-17 NOTE — Patient Instructions (Addendum)
Your provider has requested that you go to the basement level for lab work before leaving today. Press "B" on the elevator. The lab is located at the first door on the left as you exit the elevator.   You have been scheduled for an appointment with Dr Leighton Ruff at [facility]. Your appointment is on ____________ at ________________. Please arrive at ______________________ for registration. Make certain to bring a list of current medications, including any over the counter medications or vitamins. Also bring your co-pay if you have one as well as your insurance cards. [facility] is located at [address]. Should you need to reschedule your appointment, please contact them at [phone].   We have sent the following medications to your pharmacy for you to pick up at your convenience: Generic lomotil and generic zofran    I appreciate the opportunity to care for you. Silvano Rusk, MD, [facility]

## 2018-11-18 ENCOUNTER — Ambulatory Visit: Payer: BLUE CROSS/BLUE SHIELD | Admitting: Internal Medicine

## 2018-11-19 LAB — QUANTIFERON-TB GOLD PLUS
Mitogen-NIL: >10 IU/mL
NIL: 0.05 IU/mL
QuantiFERON-TB Gold Plus: NEGATIVE
TB1-NIL: 0.02 IU/mL
TB2-NIL: 0.06 IU/mL

## 2018-11-21 NOTE — Progress Notes (Signed)
Quantiferon - TB test is negative

## 2018-12-14 ENCOUNTER — Other Ambulatory Visit: Payer: Self-pay | Admitting: Internal Medicine

## 2019-01-02 ENCOUNTER — Ambulatory Visit (HOSPITAL_COMMUNITY)
Admission: EM | Admit: 2019-01-02 | Discharge: 2019-01-02 | Disposition: A | Discharge status: Home / Self Care | Payer: BC Managed Care – PPO | Attending: Internal Medicine | Admitting: Internal Medicine

## 2019-01-02 ENCOUNTER — Encounter (HOSPITAL_COMMUNITY): Payer: Self-pay

## 2019-01-02 ENCOUNTER — Ambulatory Visit (INDEPENDENT_AMBULATORY_CARE_PROVIDER_SITE_OTHER): Payer: BC Managed Care – PPO

## 2019-01-02 ENCOUNTER — Other Ambulatory Visit: Payer: Self-pay

## 2019-01-02 DIAGNOSIS — R509 Fever, unspecified: Secondary | ICD-10-CM

## 2019-01-02 DIAGNOSIS — K5090 Crohn's disease, unspecified, without complications: Secondary | ICD-10-CM

## 2019-01-02 DIAGNOSIS — R05 Cough: Secondary | ICD-10-CM | POA: Diagnosis not present

## 2019-01-02 DIAGNOSIS — Z20822 Contact with and (suspected) exposure to covid-19: Secondary | ICD-10-CM

## 2019-01-02 DIAGNOSIS — M791 Myalgia, unspecified site: Secondary | ICD-10-CM | POA: Diagnosis not present

## 2019-01-02 DIAGNOSIS — R6889 Other general symptoms and signs: Secondary | ICD-10-CM

## 2019-01-02 MED ORDER — ONDANSETRON 4 MG PO TBDP: 4.0000 mg | ORAL_TABLET | Freq: Three times a day (TID) | ORAL | 0 refills | Status: DC | PRN

## 2019-01-02 MED ORDER — BENZONATATE 100 MG PO CAPS: 100.0000 mg | ORAL_CAPSULE | Freq: Three times a day (TID) | ORAL | 0 refills | Status: DC

## 2019-01-02 NOTE — ED Provider Notes (Signed)
[place]    CSN: [id number] Arrival date & time: 01/02/19  [place]      History   Chief Complaint Chief Complaint  Patient presents with  . URI  . Emesis  . Diarrhea    HPI [name] [name] is a 43 y.o. male with history of Crohn's disease on Humira comes to urgent care with complaints of a 2-day history of generalized body aches, subjective fever cough, upper respiratory infection symptoms.  Symptoms started insidiously and is gotten progressively worse.  He has cough which is not productive of sputum.  He has not tried any over-the-counter medications.  He denies any wheezing.  Vomiting is none bloody/nonbilious and diarrhea is nonbloody.  He denies any sick contacts.  No dizziness, near syncope or syncopal episodes.   HPI  Past Medical History:  Diagnosis Date  . Anal fissure and fistula    chronic  . Anxiety   . 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 Crohn's disease [facility]) followed by dr Carlean Purl   small and large intestines--  hx bowel resection 02/ 2009--  currently treated w/ humira  . Depression   . Essential hypertension, benign   . History of CMV 10/14/2010   resolved w/ antiviral therapy  . History of kidney stones   . History of resection of small bowel   . Hyperlipidemia   . OSA (obstructive sleep apnea)    01-05-2018 per pt resolved since tonsils removed   . Vitamin D deficiency     Patient Active Problem List   Diagnosis Date Noted  . Encounter for screening for HIV 07/07/2018  . Hyperglycemia 07/07/2018  . Bronchitis 06/29/2018  . 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 Perianal dermatitis 10/26/2017  . Encounter for well adult exam with abnormal findings 05/10/2017  . High risk sexual behavior 06/30/2016  . Folliculitis 53/66/4403  . Ganglion cyst of flexor tendon sheath 11/02/2014  . Chronic posterior anal fissure 02/15/2014  . Tinea pedis 06/27/2013  . Long-term use of  immunosuppressant medication - Humira 10/14/2010  . Obesity, unspecified 07/20/2007  . Anxiety with depression 07/20/2007  . Insomnia 07/20/2007  . OSA (obstructive sleep apnea) 07/20/2007  . Essential hypertension, benign 07/20/2007  . CROHN'S DISEASE, LARGE AND SMALL INTESTINES 07/20/2007    Past Surgical History:  Procedure Laterality Date  . ANAL FISTULOTOMY N/A 01/07/2018   Procedure: ANAL FISTULOTOMY;  Surgeon: Leighton Ruff, MD;  Location: [facility];  Service: General;  Laterality: N/A;  . COLONOSCOPY  last one 04-2017   dr Carlean Purl  . EXPLORATORY LAPAROTOMY W/ ILEOSECECTOMY W/ PRIMARY ANASTAMOSIS  08-05-2007    dr gross  [facility]   crohn's, sbo, perferation's  . GANGLION CYST EXCISION Right 2016   wrist  . HYPOSPADIAS CORRECTION  CHILD  . SPHINCTEROTOMY N/A 01/07/2018   Procedure: CHEMICAL SPHINCTEROTOMY (BOTOX);  Surgeon: Leighton Ruff, MD;  Location: [facility];  Service: General;  Laterality: N/A;  . TONSILLECTOMY Bilateral 09/30/2015   Procedure: BILATERAL TONSILLECTOMY;  Surgeon: Izora Gala, MD;  Location: [place];  Service: ENT;  Laterality: Bilateral;  . UPPER GASTROINTESTINAL ENDOSCOPY  10/15/2010   w/biopsy, mild gastritis and duodenitis       Home Medications    Prior to Admission medications   Medication Sig Start Date End Date Taking? Authorizing Provider  acetaminophen (TYLENOL) 325 MG tablet Take 650 mg by mouth every 6 (six) hours as needed for moderate pain.    [provider]  ALPRAZolam (XANAX XR) 3 MG 24 hr tablet Take 1 tablet (3 mg total) by mouth every morning. 07/07/18   Biagio Borg, MD  alprazolam Duanne Moron) 2 MG tablet TAKE 1/2 TAB BY MOUTH TWICE DAILY AS NEEDED 07/07/18   Biagio Borg, MD  benzonatate (TESSALON) 100 MG capsule Take 1 capsule (100 mg total) by mouth every 8 (eight) hours. 01/02/19   Chase Picket, MD  cetirizine (ZYRTEC) 10 MG tablet Take 10 mg by mouth daily.    [provider]   colestipol (COLESTID) 1 g tablet TAKE 5 TABLETS BY MOUTH DAILY Patient taking differently: TAKE 5 TABLETS BY MOUTH DAILY--- takes in evening 10/27/17   Gatha Mayer, MD  diphenoxylate-atropine (LOMOTIL) 2.5-0.025 MG tablet TAKE 1 TABLET BY MOUTH FOUR TIMES A DAY AS NEEDED FOR DIARRHEA / LOOSE STOOLS 11/17/18   Gatha Mayer, MD  DULoxetine (CYMBALTA) 60 MG capsule Take 1 capsule (60 mg total) by mouth 2 (two) times daily. Patient taking differently: Take 120 mg by mouth daily.  07/07/18   Biagio Borg, MD  HUMIRA PEN 40 MG/0.4ML PNKT INJECT 40 MG INTO THE SKIN ONCE A WEEK 12/14/18   Gatha Mayer, MD  lisinopril-hydrochlorothiazide (PRINZIDE,ZESTORETIC) 20-25 MG tablet Take 1 tablet by mouth daily. 07/07/18   Biagio Borg, MD  ondansetron (ZOFRAN ODT) 4 MG disintegrating tablet Take 1 tablet (4 mg total) by mouth every 8 (eight) hours as needed for nausea or vomiting. 01/02/19   , Myrene Galas, MD  ondansetron (ZOFRAN) 4 MG tablet TAKE 1 TAB BY MOUTH EVERY 6 HOURS 11/17/18   Gatha Mayer, MD  TRUVADA 200-300 MG tablet TAKE 1 TABLET BY MOUTH EVERY DAY 09/12/18   Biagio Borg, MD  zolpidem (AMBIEN) 10 MG tablet TAKE 1 TABLET (10 MG TOTAL) BY MOUTH AT BEDTIME AS NEEDED FOR SLEEP 07/07/18   Biagio Borg, MD    Family History Family History  Problem Relation Age of Onset  . Heart disease Father        CAD/MI  . Hyperlipidemia Father   . Hypertension Father   . Gout Father   . Sleep apnea Father   . Obesity Mother   . Allergies Mother   . Cancer Mother   . Stroke Maternal Grandfather   . Allergies Sister   . Asthma Sister   . Colon cancer Neg Hx   . Diabetes Neg Hx   . COPD Neg Hx   . Colon polyps Neg Hx     Social History Social History   Tobacco Use  . Smoking status: Never Smoker  . Smokeless tobacco: Never Used  Substance Use Topics  . Alcohol use: Yes    Alcohol/week: 3.0 standard drinks    Types: 3 Shots of liquor per week  . Drug use: No     Allergies    Patient has no known allergies.   Review of Systems Review of Systems  Constitutional: Positive for activity change, appetite change, chills, fatigue and fever.  HENT: Positive for congestion. Negative for ear discharge, ear pain, mouth sores, rhinorrhea, sinus pain, sneezing and sore throat.   Eyes: Negative.   Respiratory: Positive for shortness of breath. Negative for chest tightness.   Cardiovascular: Positive for chest pain. Negative for palpitations.  Gastrointestinal: Negative.   Genitourinary: Negative.   Musculoskeletal: Positive for arthralgias, back pain and myalgias.  Skin: Negative.   Neurological: Positive for headaches. Negative for dizziness and weakness.  Psychiatric/Behavioral: Negative.  Physical Exam Triage Vital Signs ED Triage Vitals  Enc Vitals Group     BP 01/02/19 1950 120/82     Pulse Rate 01/02/19 1950 79     Resp 01/02/19 1950 17     Temp 01/02/19 1950 98.1 F (36.7 C)     Temp Source 01/02/19 1950 Oral     SpO2 01/02/19 1950 98 %     Weight --      Height --      Head Circumference --      Peak Flow --      Pain Score 01/02/19 1951 6     Pain Loc --      Pain Edu? --      Excl. in [place]? --    No data found.  Updated Vital Signs BP 120/82 (BP Location: Left Arm)   Pulse 79   Temp 98.1 F (36.7 C) (Oral)   Resp 17   SpO2 98%   Visual Acuity Right Eye Distance:   Left Eye Distance:   Bilateral Distance:    Right Eye Near:   Left Eye Near:    Bilateral Near:     Physical Exam   UC Treatments / Results  Labs (all labs ordered are listed, but only abnormal results are displayed) Labs Reviewed - No data to display  EKG   Radiology No results found.  Procedures Procedures (including critical care time)  Medications Ordered in UC Medications - No data to display  Initial Impression / Assessment and Plan / UC Course  I have reviewed the triage vital signs and the nursing notes.  Pertinent labs & imaging results  that were available during my care of the patient were reviewed by me and considered in my medical decision making (see chart for details).     1.  Suspected COVID-19 infection: Tylenol/Motrin as needed for pain Tessalon Perles as needed for cough Zofran as needed for nausea vomiting COVID testing through the community testing site Patient advised to self isolate until the test is done and results of COVID-19 testing is known  2.  Crohn's disease on Humira: Discontinue Humira use at this time Patient advised to return to urgent care if he develops any worsening shortness of breath or cough gets productive. Final Clinical Impressions(s) / UC Diagnoses   Final diagnoses:  Suspected Covid-19 Virus Infection   Discharge Instructions   None    ED Prescriptions    Medication Sig Dispense Auth. Provider   benzonatate (TESSALON) 100 MG capsule Take 1 capsule (100 mg total) by mouth every 8 (eight) hours. 30 capsule , Myrene Galas, MD   ondansetron (ZOFRAN ODT) 4 MG disintegrating tablet Take 1 tablet (4 mg total) by mouth every 8 (eight) hours as needed for nausea or vomiting. 30 tablet , Myrene Galas, MD     Controlled Substance Prescriptions [place] Controlled Substance Registry consulted? No   Chase Picket, MD 01/02/19 2106

## 2019-01-02 NOTE — ED Triage Notes (Signed)
Pt presents with upper respiratory cold symptoms; chest congestion, non productive cough, generalized body aches,chills, vomiting, and diarrhea X 2 days.

## 2019-01-03 ENCOUNTER — Telehealth: Payer: Self-pay | Admitting: Internal Medicine

## 2019-01-03 ENCOUNTER — Telehealth: Payer: Self-pay | Admitting: *Deleted

## 2019-01-03 NOTE — Telephone Encounter (Signed)
Pt called to schedule testing at [facility]. Order had been placed. No availability at site. Pt aware no scheduled appt necessary.   Testing process reviewed , wear mask, stay in car; pt verbalizes understanding. Pts CB# [phone]

## 2019-01-03 NOTE — Telephone Encounter (Signed)
-----   Message from Chase Picket, MD sent at 01/02/2019  7:55 PM EDT ----- Regarding: COVID 19 testing Patient presents with cough chills generalized body aches and diarrhea of 2 days duration.

## 2019-01-03 NOTE — Telephone Encounter (Signed)
Left voicemail for patient to return call to schedule COVID 19 test.  Order had already been placed.

## 2019-01-04 ENCOUNTER — Other Ambulatory Visit: Payer: Self-pay

## 2019-01-04 ENCOUNTER — Other Ambulatory Visit: Payer: Self-pay | Admitting: Internal Medicine

## 2019-01-04 DIAGNOSIS — Z20822 Contact with and (suspected) exposure to covid-19: Secondary | ICD-10-CM

## 2019-01-04 NOTE — Progress Notes (Signed)
co

## 2019-01-08 LAB — NOVEL CORONAVIRUS, NAA: SARS-CoV-2, NAA: NOT DETECTED

## 2019-01-09 ENCOUNTER — Encounter: Payer: Self-pay | Admitting: Internal Medicine

## 2019-01-09 ENCOUNTER — Telehealth: Payer: Self-pay | Admitting: Internal Medicine

## 2019-01-09 ENCOUNTER — Telehealth: Payer: BC Managed Care – PPO | Admitting: Physician Assistant

## 2019-01-09 ENCOUNTER — Telehealth: Payer: Self-pay

## 2019-01-09 ENCOUNTER — Encounter: Payer: Self-pay | Admitting: Physician Assistant

## 2019-01-09 DIAGNOSIS — R062 Wheezing: Secondary | ICD-10-CM

## 2019-01-09 DIAGNOSIS — R079 Chest pain, unspecified: Secondary | ICD-10-CM

## 2019-01-09 DIAGNOSIS — R059 Cough, unspecified: Secondary | ICD-10-CM

## 2019-01-09 DIAGNOSIS — R05 Cough: Secondary | ICD-10-CM

## 2019-01-09 DIAGNOSIS — G4489 Other headache syndrome: Secondary | ICD-10-CM

## 2019-01-09 DIAGNOSIS — Z20828 Contact with and (suspected) exposure to other viral communicable diseases: Secondary | ICD-10-CM

## 2019-01-09 DIAGNOSIS — Z20822 Contact with and (suspected) exposure to covid-19: Secondary | ICD-10-CM

## 2019-01-09 MED ORDER — PROMETHAZINE-DM 6.25-15 MG/5ML PO SYRP: 5.0000 mL | ORAL_SOLUTION | Freq: Four times a day (QID) | ORAL | 1 refills | Status: DC | PRN

## 2019-01-09 NOTE — Progress Notes (Signed)
Based on what you shared with me, I feel your condition warrants further evaluation and I recommend that you be seen for a face to face office visit.  NOTE: If you entered your credit card information for this eVisit, you will not be charged. You may see a "hold" on your card for the $35 but that hold will drop off and you will not have a charge processed.  [name] [name],  It is advised that you have a face to face evaluation of your symptoms.   If you are having a true medical emergency please call 911.     For an urgent face to face visit, [place] has five urgent care centers for your convenience:    [email or web address] to reserve your spot online an avoid wait times  [name] [name] (New Address!) [address] [place], [place] [address] *Just off [address], across the road from [place] hours of operation: Monday-Friday, 12 PM to 6 PM  Closed Saturday & Sunday   The following sites will take your insurance:  . [facility]    [phone]                  Get Driving Directions  [address], [place] [address] . 10 am to 8 pm Monday-Friday . 12 pm to 8 pm Saturday-Sunday   . [facility] at [place]                  Get Driving Directions  [address], [place], [place] [address] . 8 am to 8 pm Monday-Friday . 9 am to 6 pm Saturday . 11 am to 6 pm Sunday   . [facility] at [place]                  Get Driving Directions   [address], [place] [address] . 8 am to 8 pm Monday-Friday . 8 am to 4 pm Saturday-Sunday    . [facility] at [place]                    Get Driving Directions  [phone]  [address], [place], [place] [address]  . Monday-Friday, 12 PM to 6 PM    Your e-visit answers were reviewed by a board certified advanced  clinical practitioner to complete your personal care plan.  Thank you for using e-Visits. I spent 5-10 minutes on review and completion of this note- [name] [name] [facility]

## 2019-01-09 NOTE — Telephone Encounter (Signed)
Patent reports that he has continued cough and the tessalon pearls that were given him are not helping.  He had Covid test last Wed am.  Orders in the system still stay future.  He is advised that he needs to contact his PCP.  I reviewed the e-visit he had today and they recommended he be evaluated in person.  He will continue to hold his Humira.  He will contact his PCP about cough and evaluation as recommended in the e-visit.

## 2019-01-09 NOTE — Telephone Encounter (Signed)
Pharmacy requested refill of his Lomotil, will this be okay Sir?

## 2019-01-09 NOTE — Telephone Encounter (Signed)
Copied from [place] [id number]. Topic: General - Other >> Jan 09, 2019  3:24 PM [name] [name] wrote: Reason for CRM: Patient called to say that his cough is not improving and sometimes seem even worst. Asking for something like cough medicine or an inhaler so it can help with his breathing and the pressure in his chest. Please advise Ph# [phone]

## 2019-01-09 NOTE — Telephone Encounter (Signed)
Patient called said that he has a bad cough he got tested for COVID on Wednesday but would like to know what Dr. Carlean Purl would recommend. He says he has pain on left and right side from coughing so much.

## 2019-01-10 MED ORDER — DIPHENOXYLATE-ATROPINE 2.5-0.025 MG PO TABS: ORAL_TABLET | ORAL | 1 refills | Status: DC

## 2019-01-10 NOTE — Telephone Encounter (Signed)
Patient notified

## 2019-01-10 NOTE — Telephone Encounter (Signed)
Covid test was negative  He should be seen in person and have an exam as suggested by the [facility] is reasonable as they suggested vs Dr. Jenny Reichmann  Would stay off Humira til we know results of that evaluation

## 2019-01-10 NOTE — Telephone Encounter (Signed)
Lomotil refilled

## 2019-01-18 ENCOUNTER — Telehealth: Payer: Self-pay | Admitting: Internal Medicine

## 2019-01-18 NOTE — Telephone Encounter (Signed)
Patient notified He is already on weekly Humira He will call back for any additional questions or concerns.

## 2019-01-18 NOTE — Telephone Encounter (Signed)
Pt stated that he was advised to hold Humira while awaiting results of COVID-19 test.  Pt was instructed to schedule for a chest X-ray because he had a cough during quarantine.  Pt reported that cough has cleared, but he has been off of Humira for three weeks.    Pt would like to know when to restart Humira and whether chest X-ray is still necessary.

## 2019-01-18 NOTE — Telephone Encounter (Signed)
CXR 7/13 was ok Covid test neg  Resume Humira  If he has enough and it will work out with the Rx take the first 2 a week apart then go every other week  If not then just do every other week

## 2019-01-18 NOTE — Telephone Encounter (Signed)
Okay to resume Humira?

## 2019-01-21 ENCOUNTER — Other Ambulatory Visit: Payer: Self-pay | Admitting: Internal Medicine

## 2019-01-23 NOTE — Telephone Encounter (Signed)
Done erx 

## 2019-02-11 ENCOUNTER — Other Ambulatory Visit: Payer: Self-pay | Admitting: Internal Medicine

## 2019-04-26 ENCOUNTER — Encounter: Payer: Self-pay | Admitting: Internal Medicine

## 2019-04-26 ENCOUNTER — Other Ambulatory Visit: Payer: Self-pay

## 2019-04-26 ENCOUNTER — Telehealth: Payer: Self-pay | Admitting: Internal Medicine

## 2019-04-26 DIAGNOSIS — Z20822 Contact with and (suspected) exposure to covid-19: Secondary | ICD-10-CM

## 2019-04-26 NOTE — Telephone Encounter (Signed)
Please advise Sir? 

## 2019-04-27 MED ORDER — PHENYLEPHRINE-GUAIFENESIN 5-100 MG/5ML PO LIQD: 5.0000 mL | Freq: Four times a day (QID) | ORAL | 0 refills | Status: DC | PRN

## 2019-04-27 NOTE — Telephone Encounter (Signed)
Tell [name] [name] to wait for his test results to start Humira.  I hope he is feeling okay. If he is positive tell him to let me know

## 2019-04-27 NOTE — Telephone Encounter (Signed)
Left Ezri a detailed message with Dr Celesta Aver instructions.

## 2019-04-28 LAB — NOVEL CORONAVIRUS, NAA: SARS-CoV-2, NAA: NOT DETECTED

## 2019-04-28 NOTE — Telephone Encounter (Signed)
Covid was negative.

## 2019-05-26 ENCOUNTER — Other Ambulatory Visit: Payer: Self-pay | Admitting: Internal Medicine

## 2019-06-25 ENCOUNTER — Other Ambulatory Visit: Payer: Self-pay | Admitting: Internal Medicine

## 2019-06-26 ENCOUNTER — Other Ambulatory Visit: Payer: Self-pay

## 2019-06-26 MED ORDER — DIPHENOXYLATE-ATROPINE 2.5-0.025 MG PO TABS: ORAL_TABLET | ORAL | 1 refills | Status: DC

## 2019-06-26 NOTE — Telephone Encounter (Signed)
truvada done x 1 mo  Pt due for ROV - ok to contact pt please

## 2019-06-26 NOTE — Telephone Encounter (Signed)
No future appointment is scheduled

## 2019-06-26 NOTE — Telephone Encounter (Signed)
Lomotil refilled.

## 2019-07-07 ENCOUNTER — Encounter: Payer: Self-pay | Admitting: Internal Medicine

## 2019-07-07 MED ORDER — HYDROCODONE-HOMATROPINE 5-1.5 MG/5ML PO SYRP: 5.0000 mL | ORAL_SOLUTION | Freq: Four times a day (QID) | ORAL | 0 refills | Status: AC | PRN

## 2019-07-20 ENCOUNTER — Telehealth: Payer: Self-pay

## 2019-07-20 ENCOUNTER — Ambulatory Visit (INDEPENDENT_AMBULATORY_CARE_PROVIDER_SITE_OTHER): Payer: BC Managed Care – PPO | Admitting: Internal Medicine

## 2019-07-20 ENCOUNTER — Encounter: Payer: Self-pay | Admitting: Internal Medicine

## 2019-07-20 VITALS — BP 132/84 | HR 116 | Temp 98.7°F | Ht 69.0 in | Wt 231.4 lb

## 2019-07-20 DIAGNOSIS — R0602 Shortness of breath: Secondary | ICD-10-CM

## 2019-07-20 DIAGNOSIS — Z7189 Other specified counseling: Secondary | ICD-10-CM | POA: Diagnosis not present

## 2019-07-20 DIAGNOSIS — D849 Immunodeficiency, unspecified: Secondary | ICD-10-CM

## 2019-07-20 DIAGNOSIS — R000 Tachycardia, unspecified: Secondary | ICD-10-CM | POA: Diagnosis not present

## 2019-07-20 DIAGNOSIS — R05 Cough: Secondary | ICD-10-CM

## 2019-07-20 DIAGNOSIS — R519 Headache, unspecified: Secondary | ICD-10-CM | POA: Diagnosis not present

## 2019-07-20 DIAGNOSIS — Z20828 Contact with and (suspected) exposure to other viral communicable diseases: Secondary | ICD-10-CM | POA: Diagnosis not present

## 2019-07-20 DIAGNOSIS — R059 Cough, unspecified: Secondary | ICD-10-CM

## 2019-07-20 MED ORDER — AZITHROMYCIN 250 MG PO TABS: 250.0000 mg | ORAL_TABLET | Freq: Two times a day (BID) | ORAL | 0 refills | Status: AC

## 2019-07-20 MED ORDER — PREDNISONE 20 MG PO TABS: 20.0000 mg | ORAL_TABLET | Freq: Two times a day (BID) | ORAL | 0 refills | Status: AC

## 2019-07-20 NOTE — Patient Instructions (Addendum)
Most importantly continue to practice the "3 Ws": #1 Wear A Mask;#2 Wash Hands;#3 Wait 6 Feet Apart (Social Distancing) ! Nasal hygiene maneuvers as we discussed. Stay well-hydrated; drink up to 8 ounces of nondairy fluids every hour while awake if having fever unless you have a condition such as chronic kidney disease or heart failure which requires fluid restriction.  If so consult your nephrology or cardiology specialist. Take Tylenol every 4 hours as needed if having fever. To protect the liver, the total dose should not exceed 4000 mg total per day (less than EIGHT 500 mg tablets in 24 hour period). COVID-19 positive individual should isolate in a sick room and use a separate bathroom if possible. Isolate for at least 10 days from date symptoms began AND until temperature normal for 24 hours NOT using fever reducing medications ( aspirin, Tylenol, Advil, etc) AND symptoms (cough,shortness of breath) improved. CDC recommends any close contacts quarantine for 10-14 days. To help identify any others who may be at risk; try to identify any contacts (gatherings, travel, co-workers)you have had in the 2-14 days prior to the onset of your symptoms. Zinc lozenges 5 times a day or zinc sulfate 220 mg daily x5 days Azithromycin 250 mg twice daily  x5 days Prednisone 20 mg twice a day x5 days  Reference: American Journal of medicine, volume 134, #1, January 2021 pages 16-22; [place] your PCP on 07/23/2018 for follow-up. Covid health team monitoring recommended.

## 2019-07-20 NOTE — Telephone Encounter (Signed)
Called pt and scheduled with Resp Clinic for tonight at 06:15pm.

## 2019-07-20 NOTE — Telephone Encounter (Signed)
Called and l/m for to follow up with resp.care clinic per Dr.John.

## 2019-07-20 NOTE — Telephone Encounter (Signed)
Please to schedule pt for the Resp Clinic at [facility] or tomorrow. thanks

## 2019-07-20 NOTE — Progress Notes (Signed)
Respiratory Clinic Note   Patient: [name] [name] Male    DOB: Apr 09, 1976   44 y.o.   MRN: [id number] Visit Date: 07/20/2019  Today's Provider: [place]   No chief complaint on file. CC: Cough and fatigue Subjective:    Covid-19 Nucleic Acid Test Results Lab Results  Component Value Date   SARSCOV2NAA Not Detected 04/26/2019   [place] Not Detected 01/04/2019    HPI as of approximately 1/21 he developed a "raspy cough" which he treated with Delsym.  This was associated with profound fatigue.  The cough has persisted over the past week and is essentially stable.  He also has had temperature up to 101 for which he employed Tylenol. Additionally he has noted chills, nasal congestion with some yellow discharge, intermittent yellow sputum up to 2 teaspoons/day, shortness of breath, tachycardia, tachypnea, and wheezing.  He has noted some right-sided chest pain with cough.  Additionally he has had some loss of appetite intermittently.  Stools have been loose but this is a chronic issue with his Crohn's disease.  Intermittent frontal headache has been treated with Tylenol.   He knows of no identified contacts with Covid although coworkers at [employer] have been reported as having positive screening tests.  Significant medical comorbidities present include: elevated BMI , essential hypertension, HIV, immunosuppressant drug administration, dyslipidemia, and obstructive sleep apnea. He is a non-smoker and has no history of asthma.   Current Outpatient Medications:  .  acetaminophen (TYLENOL) 325 MG tablet, Take 650 mg by mouth every 6 (six) hours as needed for moderate pain., Disp: , Rfl:  .  ALPRAZolam (XANAX XR) 3 MG 24 hr tablet, TAKE 1 TABLET (3 MG TOTAL) BY MOUTH EVERY MORNING., Disp: 30 tablet, Rfl: 5 .  alprazolam (XANAX) 2 MG tablet, TAKE 1/2 TAB BY MOUTH TWICE DAILY AS NEEDED, Disp: 30 tablet, Rfl: 5 .  cetirizine (ZYRTEC) 10 MG tablet, Take 10 mg by mouth daily.,  Disp: , Rfl:  .  colestipol (COLESTID) 1 g tablet, TAKE 5 TABLETS BY MOUTH DAILY (Patient taking differently: TAKE 5 TABLETS BY MOUTH DAILY--- takes in evening), Disp: 450 tablet, Rfl: 3 .  diphenoxylate-atropine (LOMOTIL) 2.5-0.025 MG tablet, TAKE 1 TABLET BY MOUTH FOUR TIMES A DAY AS NEEDED FOR DIARRHEA / LOOSE STOOLS, Disp: 90 tablet, Rfl: 1 .  DULoxetine (CYMBALTA) 60 MG capsule, TAKE 1 CAPSULE (60 MG TOTAL) BY MOUTH 2 (TWO) TIMES DAILY., Disp: 180 capsule, Rfl: 3 .  HUMIRA PEN 40 MG/0.4ML PNKT, INJECT 40 MG INTO THE SKIN ONCE A WEEK, Disp: 12 each, Rfl: 3 .  lisinopril-hydrochlorothiazide (ZESTORETIC) 20-25 MG tablet, TAKE 1 TABLET BY MOUTH EVERY DAY, Disp: 90 tablet, Rfl: 3 .  ondansetron (ZOFRAN ODT) 4 MG disintegrating tablet, Take 1 tablet (4 mg total) by mouth every 8 (eight) hours as needed for nausea or vomiting., Disp: 30 tablet, Rfl: 0 .  ondansetron (ZOFRAN) 4 MG tablet, TAKE 1 TAB BY MOUTH EVERY 6 HOURS, Disp: 30 tablet, Rfl: 1 .  Phenylephrine-guaiFENesin 5-100 MG/5ML LIQD, Take 5 mLs by mouth 4 (four) times daily as needed., Disp: 473 mL, Rfl: 0 .  TRUVADA 200-300 MG tablet, TAKE 1 TABLET BY MOUTH EVERY DAY, Disp: 30 tablet, Rfl: 0 .  zolpidem (AMBIEN) 10 MG tablet, TAKE 1 TABLET (10 MG TOTAL) BY MOUTH AT BEDTIME AS NEEDED FOR SLEEP, Disp: 30 tablet, Rfl: 5  No Known Allergies  Review of Systems  Positive review of systems for Covid infection are documented above in HPI.  Not present are: HEENT: Eye redness and discharge (conjunctivitis), sore throat, anosmia, and altered taste Pulmonary:  hemoptysis GI:  nausea, vomiting, frank diarrhea Genitourinary: Oliguria or anuria Dermatologic: Rash Neurologic: dizziness, mental status changes      Objective:   BP 132/84   Pulse (!) 116   Temp 98.7 F (37.1 C)   Ht 5' 9"  (1.753 m)   Wt 231 lb 6.4 oz (105 kg)   SpO2 93%   BMI 34.17 kg/m   Physical Exam  Physical exam:  Pertinent or positive findings: Weight excess is  present.  He is alert and oriented.  He has a beard and mustache.  Nares are dry.  He exhibits an intermittent nonproductive cough.  The first heart sound is accentuated.  There is a slight gallop cadence to the rhythm.  He has diffuse low-grade wheezing in all lung fields.  He was slightly tender to palpation of the right upper quadrant without associated hepatomegaly.  General appearance: Adequately nourished; no acute distress, increased work of breathing is present.   Lymphatic: No lymphadenopathy about the head, neck, axilla. Eyes: No conjunctival inflammation or lid edema is present. There is no scleral icterus. Ears:  External ear exam shows no significant lesions or deformities.   Nose:  External nasal examination shows no deformity or inflammation. Nasal mucosa without lesions, exudates Oral exam:  Lips and gums are healthy appearing. There is no oropharyngeal erythema or exudate. Neck:  No thyromegaly, masses, tenderness noted.    Heart:  No murmur, click, rub .  Lungs:  without  rhonchi, rales, rubs. Abdomen: Bowel sounds are normal. Abdomen is soft  with no organomegaly, hernias, masses. GU: Deferred  Extremities:  No cyanosis, clubbing, edema  Skin: Warm & dry w/o tenting. No significant lesions or rash.  No results found for any visits on 07/20/19.    Assessment & Plan     #1 symptoms suggestive of active Covid infection with productive cough, fever, fatigue, dyspnea, tachycardia, anorexia, and frontal headache.  Comorbidities present of elevated BMI , essential hypertension, HIV, immunosuppression, dyslipidemia, and obstructive sleep apnea.  Plan: See after visit summary: Clinically he is felt to be at high risk for complications from Covid; his screening test was performed tonight and results are pending. Aggressive intervention will be pursued with prednisone and antibiotics as per the [facility] treatment algorithm cited in the AVS.

## 2019-07-21 LAB — NOVEL CORONAVIRUS, NAA: SARS-CoV-2, NAA: NOT DETECTED

## 2019-07-31 ENCOUNTER — Other Ambulatory Visit: Payer: Self-pay | Admitting: Internal Medicine

## 2019-07-31 NOTE — Telephone Encounter (Signed)
Done erx 

## 2019-08-02 ENCOUNTER — Encounter: Payer: Self-pay | Admitting: Internal Medicine

## 2019-08-03 ENCOUNTER — Telehealth: Payer: Self-pay

## 2019-08-03 MED ORDER — HYDROCODONE-HOMATROPINE 5-1.5 MG/5ML PO SYRP: 5.0000 mL | ORAL_SOLUTION | Freq: Four times a day (QID) | ORAL | 0 refills | Status: AC | PRN

## 2019-08-03 MED ORDER — ALBUTEROL SULFATE HFA 108 (90 BASE) MCG/ACT IN AERS: INHALATION_SPRAY | RESPIRATORY_TRACT | Status: DC

## 2019-08-03 NOTE — Telephone Encounter (Signed)
New message    1. Which medications need to be refilled? (please list name of each medication and dose if known) albuterol inhaler   2. Which pharmacy/location (including street and city if local pharmacy) is medication to be sent to? [facility] on [address]   3. Do they need a 30 day or 90 day supply?

## 2019-08-03 NOTE — Telephone Encounter (Signed)
Reviewed chart pt he is due to see PCO, but tested positive for Covid and saw provider at the respiratory clinic. Will refill x's 1 need ov for future refills.Marland KitchenJohny Chess

## 2019-08-07 MED ORDER — ALBUTEROL SULFATE HFA 108 (90 BASE) MCG/ACT IN AERS: INHALATION_SPRAY | RESPIRATORY_TRACT | 0 refills | Status: DC

## 2019-08-07 NOTE — Addendum Note (Signed)
Addended by: Earnstine Regal on: 08/07/2019 01:42 PM   Modules accepted: Orders

## 2019-08-11 ENCOUNTER — Telehealth: Payer: Self-pay | Admitting: Internal Medicine

## 2019-08-11 ENCOUNTER — Other Ambulatory Visit: Payer: Self-pay | Admitting: Internal Medicine

## 2019-08-11 NOTE — Telephone Encounter (Signed)
Done erx 

## 2019-08-11 NOTE — Telephone Encounter (Signed)
Pt stated that he will be travelling and would like to know how to travel with Remicade--check in or carryon luggage.

## 2019-08-11 NOTE — Telephone Encounter (Signed)
Let pt know he should check with the airline and see what their restrictions are and then decide what and how he needs to transport the humira.

## 2019-08-14 ENCOUNTER — Other Ambulatory Visit: Payer: Self-pay

## 2019-08-14 MED ORDER — DIPHENOXYLATE-ATROPINE 2.5-0.025 MG PO TABS: ORAL_TABLET | ORAL | 1 refills | Status: DC

## 2019-08-14 NOTE — Telephone Encounter (Signed)
Generic l[facility]il [facility]

## 2019-09-18 ENCOUNTER — Other Ambulatory Visit: Payer: Self-pay | Admitting: Internal Medicine

## 2019-09-18 ENCOUNTER — Telehealth: Payer: Self-pay

## 2019-09-18 NOTE — Telephone Encounter (Signed)
Be  Back in [place] 4.24.21 will call to make an appt    1.Medication Requested:zolpidem (AMBIEN) 10 MG tablet  2. Pharmacy (Name, [place]): [facility] [place]  Store # [id number]- phone # [phone]  3. On Med List: Yes   4. Last Visit with PCP: 1.16.20   5. Next visit date with PCP:   Agent: Please be advised that RX refills may take up to 3 business days. We ask that you follow-up with your pharmacy.

## 2019-09-18 NOTE — Telephone Encounter (Signed)
Unable for further refill due to office refill policy, last seen jan 2020 per shambley nP

## 2019-09-18 NOTE — Telephone Encounter (Signed)
Please advise 

## 2019-09-19 ENCOUNTER — Telehealth: Payer: Self-pay

## 2019-09-19 ENCOUNTER — Telehealth: Payer: Self-pay | Admitting: Internal Medicine

## 2019-09-19 MED ORDER — ZOLPIDEM TARTRATE 10 MG PO TABS: 10.0000 mg | ORAL_TABLET | Freq: Every evening | ORAL | 0 refills | Status: DC | PRN

## 2019-09-19 NOTE — Telephone Encounter (Signed)
Please advise Sir? Thank you for your time.

## 2019-09-19 NOTE — Telephone Encounter (Signed)
Patient is calling- he states that he is in [place] and has been for the past month because he is having to care of his father. He states that he is out of his [place]. He knows Dr. Carlean Purl is not the one who prescribes it but he states she called Dr. Judi Cong office to help him with a refill but he needed an appointment to get a refill- he made and appointment and they still will not refill it for him. He states he is not feeling well and does not want to just quit this medication. He is asking if there is anything Dr. Carlean Purl can recommend or help him get a refill of medication.

## 2019-09-19 NOTE — Telephone Encounter (Signed)
Dr. Jenny Reichmann is following his office policy which I understand  Since I have seen Areon within past year I can Rx x 1 # 30 but need to know a pharmacy to send it to

## 2019-09-19 NOTE — Telephone Encounter (Signed)
I spoke with [name] [name] and he and his Dad will be coming back to [facility] on April 24th. 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 He has a Dr Jenny Reichmann appointment April 26th. [facility] am [facility]rin [facility] [place] [facility].

## 2019-09-19 NOTE — Telephone Encounter (Signed)
I left a detailed voice mail for patient to call me back with the drug store he prefers [place] to send his rx to.

## 2019-09-19 NOTE — Telephone Encounter (Signed)
F/u  The patient is aware of Dr. Gwynn Burly messages wants to know the side effect of not taking medication for 3 weeks asking for the CMA to call him back.   The patient voiced he wanted the office manager's name to discuss this situation.   Appt is made on 4.26.21 @ 1:00pm

## 2019-09-19 NOTE — Telephone Encounter (Signed)
[place]  I decline to call pt for his pharmacy there  [facility] for staff to contact pt

## 2019-09-19 NOTE — Telephone Encounter (Signed)
I left patient a detailed message that the [facility] [place] cannot [facility] a faxed rx for his [place]. Has to be sent electronic which it won't do. I ask if I could do it verbally and she said no.[name] [name] called back and this is the plan: I am going to send it in locally and he will get it picked up and sent to him. [facility].

## 2019-09-20 NOTE — Telephone Encounter (Signed)
No , shouldn't be anything serious, thanks

## 2019-09-20 NOTE — Telephone Encounter (Signed)
Pt contacted and informed there should be no side effects per PCP.

## 2019-10-15 ENCOUNTER — Other Ambulatory Visit: Payer: Self-pay | Admitting: Internal Medicine

## 2019-10-15 NOTE — Telephone Encounter (Signed)
Please let pt know - this medication I believe is normally refilled per ID

## 2019-10-16 ENCOUNTER — Other Ambulatory Visit: Payer: Self-pay

## 2019-10-16 ENCOUNTER — Ambulatory Visit: Payer: BC Managed Care – PPO | Admitting: Internal Medicine

## 2019-10-16 ENCOUNTER — Encounter: Payer: Self-pay | Admitting: Internal Medicine

## 2019-10-16 VITALS — BP 130/80 | HR 84 | Temp 98.5°F | Ht 69.0 in | Wt 227.0 lb

## 2019-10-16 DIAGNOSIS — R0600 Dyspnea, unspecified: Secondary | ICD-10-CM

## 2019-10-16 DIAGNOSIS — F418 Other specified anxiety disorders: Secondary | ICD-10-CM

## 2019-10-16 DIAGNOSIS — E611 Iron deficiency: Secondary | ICD-10-CM | POA: Diagnosis not present

## 2019-10-16 DIAGNOSIS — Z0000 Encounter for general adult medical examination without abnormal findings: Secondary | ICD-10-CM

## 2019-10-16 DIAGNOSIS — E538 Deficiency of other specified B group vitamins: Secondary | ICD-10-CM

## 2019-10-16 DIAGNOSIS — Z0001 Encounter for general adult medical examination with abnormal findings: Secondary | ICD-10-CM

## 2019-10-16 DIAGNOSIS — I10 Essential (primary) hypertension: Secondary | ICD-10-CM

## 2019-10-16 DIAGNOSIS — E559 Vitamin D deficiency, unspecified: Secondary | ICD-10-CM | POA: Diagnosis not present

## 2019-10-16 DIAGNOSIS — R739 Hyperglycemia, unspecified: Secondary | ICD-10-CM

## 2019-10-16 DIAGNOSIS — Z114 Encounter for screening for human immunodeficiency virus [HIV]: Secondary | ICD-10-CM

## 2019-10-16 MED ORDER — ALBUTEROL SULFATE HFA 108 (90 BASE) MCG/ACT IN AERS: 2.0000 | INHALATION_SPRAY | Freq: Four times a day (QID) | RESPIRATORY_TRACT | 5 refills | Status: DC | PRN

## 2019-10-16 NOTE — Assessment & Plan Note (Addendum)
For pfts, albuterol hfa prn,  to f/u any worsening symptoms or concerns  I spent 41 minutes in addition to time for CPX wellness examination in preparing to see the patient by review of recent labs, imaging and procedures, obtaining and reviewing separately obtained history, communicating with the patient and family or caregiver, ordering medications, tests or procedures, and documenting clinical information in the EHR including the differential Dx, treatment, and any further evaluation and other management of dyspnea, anxiety depression hyperglycemia HIV Screen, HTN,

## 2019-10-16 NOTE — Assessment & Plan Note (Signed)
stable overall by history and exam, recent data reviewed with pt, and pt to continue medical treatment as before,  to f/u any worsening symptoms or concerns  

## 2019-10-16 NOTE — Patient Instructions (Signed)
Please take all new medication as prescribed - the inhaler as needed  Please continue all other medications as before, and refills have been done if requested.  Please have the pharmacy call with any other refills you may need.  Please continue your efforts at being more active, low cholesterol diet, and weight control.  You are otherwise up to date with prevention measures today.  Please keep your appointments with your specialists as you may have planned  You will be contacted regarding the referral for: lung testing (pulmonary function tests), and Psychology  Please go to the LAB at the blood drawing area for the tests to be done  You will be contacted by phone if any changes need to be made immediately.  Otherwise, you will receive a letter about your results with an explanation, but please check with MyChart first.  Please remember to sign up for MyChart if you have not done so, as this will be important to you in the future with finding out test results, communicating by private email, and scheduling acute appointments online when needed.  Please make an Appointment to return for your 1 year visit, or sooner if needed

## 2019-10-16 NOTE — Progress Notes (Signed)
Subjective:    Patient ID: [name] [name], male    DOB: [birth year], 44 y.o.   MRN: [id number]  HPI  Here for wellness and f/u;  Overall doing ok;  Pt denies Chest pain, worsening DOE, wheezing, orthopnea, PND, worsening LE edema, palpitations, dizziness or syncope, except for episodes anxiety associated with dyspnea, asks for pfts..  Pt denies neurological change such as new headache, facial or extremity weakness.  Pt denies polydipsia, polyuria, or low sugar symptoms. Pt states overall good compliance with treatment and medications, good tolerability, and has been trying to follow appropriate diet.  Pt denies worsening depressive symptoms, suicidal ideation, but anger, anxiety and panic have been worsening. Asks for psychotherapy referral. No fever, night sweats, loss of appetite, or other constitutional symptoms.  Pt states good ability with ADL's, has low fall risk, home safety reviewed and adequate, no other significant changes in hearing or vision, and only occasionally active with exercise. Lost wt with better diet.  Remains sexually active with partner hIV + Wt Readings from Last 3 Encounters:  10/16/19 227 lb (103 kg)  07/20/19 231 lb 6.4 oz (105 kg)  11/17/18 237 lb (107.5 kg)   Past Medical History:  Diagnosis Date  . Anal fissure and fistula    chronic  . Anxiety   . 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 Crohn's disease [facility]) followed by dr Carlean Purl   small and large intestines--  hx bowel resection 02/ 2009--  currently treated w/ humira  . Depression   . Essential hypertension, benign   . History of CMV 10/14/2010   resolved w/ antiviral therapy  . History of kidney stones   . History of resection of small bowel   . Hyperlipidemia   . OSA (obstructive sleep apnea)    01-05-2018 per pt resolved since tonsils removed   . Vitamin D deficiency    Past Surgical History:  Procedure Laterality Date  . ANAL FISTULOTOMY N/A 01/07/2018   Procedure:  ANAL FISTULOTOMY;  Surgeon: Leighton Ruff, MD;  [facility]: [facility];  Service: General;  Laterality: N/A;  . COLONOSCOPY  last one 04-2017   dr Carlean Purl  . EXPLORATORY LAPAROTOMY W/ ILEOSECECTOMY W/ PRIMARY ANASTAMOSIS  08-05-2007    dr gross  [facility]   crohn's, sbo, perferation's  . GANGLION CYST EXCISION Right 2016   wrist  . HYPOSPADIAS CORRECTION  CHILD  . SPHINCTEROTOMY N/A 01/07/2018   Procedure: CHEMICAL SPHINCTEROTOMY (BOTOX);  Surgeon: Leighton Ruff, MD;  Location: [facility];  Service: General;  Laterality: N/A;  . TONSILLECTOMY Bilateral 09/30/2015   Procedure: BILATERAL TONSILLECTOMY;  Surgeon: Izora Gala, MD;  Location: [facility];  Service: ENT;  Laterality: Bilateral;  . UPPER GASTROINTESTINAL ENDOSCOPY  10/15/2010   w/biopsy, mild gastritis and duodenitis    reports that he has never smoked. He has never used smokeless tobacco. He reports current alcohol use of about 3.0 standard drinks of alcohol per week. He reports that he does not use drugs. family history includes Allergies in his mother and sister; Asthma in his sister; Cancer in his mother; Gout in his father; Heart disease in his father; Hyperlipidemia in his father; Hypertension in his father; Obesity in his mother; Sleep apnea in his father; Stroke in his maternal grandfather. No Known Allergies Current Outpatient Medications on File Prior to Visit  Medication Sig Dispense Refill  . acetaminophen (TYLENOL) 325 MG tablet Take 650 mg by mouth every 6 (six) hours as needed for moderate  pain.    Marland Kitchen ALPRAZolam (XANAX XR) 3 MG 24 hr tablet TAKE 1 TABLET (3 MG TOTAL) BY MOUTH EVERY MORNING. 30 tablet 5  . alprazolam (XANAX) 2 MG tablet TAKE 1/2 TAB BY MOUTH TWICE DAILY AS NEEDED 30 tablet 5  . cetirizine (ZYRTEC) 10 MG tablet Take 10 mg by mouth daily.    . colestipol (COLESTID) 1 g tablet TAKE 5 TABLETS BY MOUTH DAILY (Patient taking differently: TAKE 5 TABLETS BY MOUTH DAILY--- takes in evening) 450  tablet 3  . diphenoxylate-atropine (LOMOTIL) 2.5-0.025 MG tablet TAKE 1 TABLET BY MOUTH FOUR TIMES A DAY AS NEEDED FOR DIARRHEA / LOOSE STOOLS 90 tablet 1  . DULoxetine (CYMBALTA) 60 MG capsule TAKE 1 CAPSULE (60 MG TOTAL) BY MOUTH 2 (TWO) TIMES DAILY. 180 capsule 3  . HUMIRA PEN 40 MG/0.4ML PNKT INJECT 40 MG INTO THE SKIN ONCE A WEEK 12 each 3  . lisinopril-hydrochlorothiazide (ZESTORETIC) 20-25 MG tablet TAKE 1 TABLET BY MOUTH EVERY DAY 90 tablet 3  . ondansetron (ZOFRAN) 4 MG tablet TAKE 1 TAB BY MOUTH EVERY 6 HOURS 30 tablet 1  . TRUVADA 200-300 MG tablet TAKE 1 TABLET BY MOUTH EVERY DAY 30 tablet 0  . zolpidem (AMBIEN) 10 MG tablet Take 1 tablet (10 mg total) by mouth at bedtime as needed for sleep. 30 tablet 0   No current facility-administered medications on file prior to visit.   Review of Systems All otherwise neg per pt     Objective:   Physical Exam BP 130/80 (BP Location: Left Arm, Patient Position: Sitting, Cuff Size: Large)   Pulse 84   Temp 98.5 F (36.9 C) (Oral)   Ht 5' 9"  (1.753 m)   Wt 227 lb (103 kg)   SpO2 97%   BMI 33.52 kg/m  VS noted,  Constitutional: Pt appears in NAD HENT: Head: NCAT.  Right Ear: External ear normal.  Left Ear: External ear normal.  Eyes: . Pupils are equal, round, and reactive to light. Conjunctivae and EOM are normal Nose: without d/c or deformity Neck: Neck supple. Gross normal ROM Cardiovascular: Normal rate and regular rhythm.   Pulmonary/Chest: Effort normal and breath sounds without rales or wheezing.  Abd:  Soft, NT, ND, + BS, no organomegaly Neurological: Pt is alert. At baseline orientation, motor grossly intact Skin: Skin is warm. No rashes, other new lesions, no LE edema Psychiatric: Pt behavior is normal without agitation , 2+ nervous All otherwise neg per pt Lab Results  Component Value Date   WBC 7.3 07/07/2018   HGB 14.6 07/07/2018   HCT 43.4 07/07/2018   PLT 234.0 07/07/2018   GLUCOSE 100 (H) 07/07/2018   CHOL  179 07/07/2018   TRIG 118.0 07/07/2018   HDL 52.10 07/07/2018   LDLCALC 104 (H) 07/07/2018   ALT 33 07/07/2018   AST 25 07/07/2018   NA 134 (L) 07/07/2018   K 4.2 07/07/2018   CL 99 07/07/2018   CREATININE 1.00 07/07/2018   BUN 20 07/07/2018   CO2 28 07/07/2018   TSH 0.98 07/07/2018   PSA 0.40 07/07/2018   HGBA1C 6.0 07/07/2018      Assessment & Plan:

## 2019-10-16 NOTE — Assessment & Plan Note (Signed)
Cont tx, also for counseling referral

## 2019-10-16 NOTE — Assessment & Plan Note (Signed)
For hIV screening

## 2019-10-16 NOTE — Assessment & Plan Note (Signed)

## 2019-10-18 NOTE — Addendum Note (Signed)
Addended by: Karren Cobble on: 10/18/2019 10:38 AM   Modules accepted: Orders

## 2019-11-09 ENCOUNTER — Ambulatory Visit (INDEPENDENT_AMBULATORY_CARE_PROVIDER_SITE_OTHER): Payer: BC Managed Care – PPO | Admitting: Psychologist

## 2019-11-09 DIAGNOSIS — F330 Major depressive disorder, recurrent, mild: Secondary | ICD-10-CM | POA: Diagnosis not present

## 2019-11-09 DIAGNOSIS — F411 Generalized anxiety disorder: Secondary | ICD-10-CM | POA: Diagnosis not present

## 2019-11-14 ENCOUNTER — Other Ambulatory Visit: Payer: Self-pay | Admitting: Internal Medicine

## 2019-11-16 ENCOUNTER — Telehealth: Payer: Self-pay

## 2019-11-16 DIAGNOSIS — Z9189 Other specified personal risk factors, not elsewhere classified: Secondary | ICD-10-CM

## 2019-11-16 MED ORDER — EMTRICITABINE-TENOFOVIR DF 200-300 MG PO TABS: 1.0000 | ORAL_TABLET | Freq: Every day | ORAL | 1 refills | Status: DC

## 2019-11-16 NOTE — Telephone Encounter (Signed)
1.Medication Requested:TRUVADA 200-300 MG tablet  2. Pharmacy (Name, [place] [place] [place] [place]  3. On Med List: Yes    4. Last Visit with PCP: 4.26.21   5. Next visit date with PCP: n/a   Agent: Please be advised that RX refills may take up to 3 business days. We ask that you follow-up with your pharmacy.

## 2019-11-21 ENCOUNTER — Ambulatory Visit (INDEPENDENT_AMBULATORY_CARE_PROVIDER_SITE_OTHER): Payer: BC Managed Care – PPO | Admitting: Psychologist

## 2019-11-21 DIAGNOSIS — F411 Generalized anxiety disorder: Secondary | ICD-10-CM | POA: Diagnosis not present

## 2019-11-21 DIAGNOSIS — F330 Major depressive disorder, recurrent, mild: Secondary | ICD-10-CM

## 2019-11-22 ENCOUNTER — Other Ambulatory Visit: Payer: Self-pay

## 2019-11-22 ENCOUNTER — Telehealth: Payer: Self-pay | Admitting: Internal Medicine

## 2019-11-22 ENCOUNTER — Telehealth: Payer: Self-pay | Admitting: Pharmacy Technician

## 2019-11-22 NOTE — Telephone Encounter (Signed)
Please advise Sir? 

## 2019-11-22 NOTE — Telephone Encounter (Signed)
Patient requesting Lorrin Mais medication

## 2019-11-22 NOTE — Telephone Encounter (Signed)
Patient requesting refill of Ambien.  Please review

## 2019-11-22 NOTE — Telephone Encounter (Signed)
He can ask PCP about this please  He needs an office visit to see me also - not related to Ambien but has been 1 year since he was in

## 2019-11-22 NOTE — Telephone Encounter (Signed)
[name], English as a foreign language completed.    The patient is insured through [employer] and has a $45 copay.  He can fill [place] is preferred at [facility] if the pharmacy switch is desired.  We will continue to follow to see if copay assistance is needed.  [name] [name]. Nadara Mustard [place] Patient [facility] Phone: [phone] Fax:  [phone]

## 2019-11-22 NOTE — Telephone Encounter (Signed)
Sent back to sheri - I had asked that he get through PCP - I thought I had just refilled in a pinch for him  He also needs a f/u me

## 2019-11-23 ENCOUNTER — Ambulatory Visit: Payer: BC Managed Care – PPO | Admitting: Pharmacist

## 2019-11-23 ENCOUNTER — Other Ambulatory Visit: Payer: Self-pay

## 2019-11-24 ENCOUNTER — Telehealth: Payer: Self-pay

## 2019-11-24 MED ORDER — ZOLPIDEM TARTRATE 10 MG PO TABS: 10.0000 mg | ORAL_TABLET | Freq: Every evening | ORAL | 0 refills | Status: DC | PRN

## 2019-11-24 NOTE — Telephone Encounter (Signed)
Attempted to return call.  Phone rang >15 times with no answer and no VM

## 2019-11-24 NOTE — Telephone Encounter (Signed)
Left message for patient to call back  

## 2019-11-24 NOTE — Telephone Encounter (Signed)
Patient is calling to follow up on previous medication request. He is really upset and would like for someone to return his call sometime today please.

## 2019-11-24 NOTE — Telephone Encounter (Signed)
I have sent in #30 no refills and would advise strongly that such language is not appropriate and I would recommend that any further language such as that with staff may result in dismissal from practice as we do not tolerate abusive behavior towards staff. Please forward this encounter to PCP so that he is aware upon his return.

## 2019-11-24 NOTE — Telephone Encounter (Signed)
New message  The patient verbalized been calling all this week getting denial on his prescription zolpidem (AMBIEN) 10 MG tablet is originally prescribed by Dr. Carlean Purl, and Dr. Carlean Purl wants Dr. Jenny Reichmann to refill the medication.  The Patient is aware that Dr. Jenny Reichmann is on vacation will return next week heading into the weekend with no e-mail response to why.   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   The patient verbalized he left a message for important people with no response.   The patient verbalized he left a message on the Practice Administrator on her voice mail yesterday as well and now it's has been 24 hours,   The patient is aware the [place] is in the office today has stepped away from her desk would he like a voice mail or take messages  The patient preferred a message sent around to Practice Admin  If she gives two shit about her client to give me a callback.   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

## 2019-11-29 ENCOUNTER — Ambulatory Visit (INDEPENDENT_AMBULATORY_CARE_PROVIDER_SITE_OTHER): Payer: BC Managed Care – PPO | Admitting: Pharmacist

## 2019-11-29 ENCOUNTER — Other Ambulatory Visit: Payer: Self-pay

## 2019-11-29 DIAGNOSIS — Z114 Encounter for screening for human immunodeficiency virus [HIV]: Secondary | ICD-10-CM | POA: Diagnosis not present

## 2019-11-29 DIAGNOSIS — Z0001 Encounter for general adult medical examination with abnormal findings: Secondary | ICD-10-CM | POA: Diagnosis not present

## 2019-11-29 DIAGNOSIS — Z113 Encounter for screening for infections with a predominantly sexual mode of transmission: Secondary | ICD-10-CM

## 2019-11-29 DIAGNOSIS — R739 Hyperglycemia, unspecified: Secondary | ICD-10-CM

## 2019-11-29 NOTE — Progress Notes (Addendum)
I have reviewed the Infectious Disease Clinical Pharmacist's note above. I agree with the assessment and plan as outlined in [name] [name]'s note.    Date:  11/29/2019   HPI: [name] [name] is a 44 y.o. male who presents to the [place] clinic for PrEP follow-up.  Insured   [x]    Uninsured  []    Patient Active Problem List   Diagnosis Date Noted  . Dyspnea 10/16/2019  . Encounter for screening for HIV 07/07/2018  . Hyperglycemia 07/07/2018  . Bronchitis 06/29/2018  . 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 Perianal dermatitis 10/26/2017  . Encounter for well adult exam with abnormal findings 05/10/2017  . High risk sexual behavior 06/30/2016  . Folliculitis 24/02/7352  . Ganglion cyst of flexor tendon sheath 11/02/2014  . Chronic posterior anal fissure 02/15/2014  . Tinea pedis 06/27/2013  . Long-term use of immunosuppressant medication - Humira 10/14/2010  . Obesity, unspecified 07/20/2007  . Anxiety with depression 07/20/2007  . Insomnia 07/20/2007  . OSA (obstructive sleep apnea) 07/20/2007  . Essential hypertension, benign 07/20/2007  . CROHN'S DISEASE, LARGE AND SMALL INTESTINES 07/20/2007    Patient's Medications  New Prescriptions   No medications on file  Previous Medications   ACETAMINOPHEN (TYLENOL) 325 MG TABLET    Take 650 mg by mouth every 6 (six) hours as needed for moderate pain.   ALBUTEROL (VENTOLIN HFA) 108 (90 BASE) MCG/ACT INHALER    Inhale 2 puffs into the lungs every 6 (six) hours as needed for wheezing or shortness of breath.   ALPRAZOLAM (XANAX XR) 3 MG 24 HR TABLET    TAKE 1 TABLET (3 MG TOTAL) BY MOUTH EVERY MORNING.   ALPRAZOLAM (XANAX) 2 MG TABLET    TAKE 1/2 TAB BY MOUTH TWICE DAILY AS NEEDED   CETIRIZINE (ZYRTEC) 10 MG TABLET    Take 10 mg by mouth daily.   COLESTIPOL (COLESTID) 1 G TABLET    TAKE 5 TABLETS BY MOUTH DAILY   DIPHENOXYLATE-ATROPINE (LOMOTIL) 2.5-0.025 MG TABLET    TAKE 1 TABLET BY  MOUTH FOUR TIMES A DAY AS NEEDED FOR DIARRHEA / LOOSE STOOLS   DULOXETINE (CYMBALTA) 60 MG CAPSULE    TAKE 1 CAPSULE (60 MG TOTAL) BY MOUTH 2 (TWO) TIMES DAILY.   EMTRICITABINE-TENOFOVIR (TRUVADA) 200-300 MG TABLET    Take 1 tablet by mouth daily.   HUMIRA PEN 40 MG/0.4ML PNKT    INJECT 40 MG INTO THE SKIN ONCE A WEEK   LISINOPRIL-HYDROCHLOROTHIAZIDE (ZESTORETIC) 20-25 MG TABLET    TAKE 1 TABLET BY MOUTH EVERY DAY   ONDANSETRON (ZOFRAN) 4 MG TABLET    TAKE 1 TAB BY MOUTH EVERY 6 HOURS   ZOLPIDEM (AMBIEN) 10 MG TABLET    Take 1 tablet (10 mg total) by mouth at bedtime as needed for sleep.  Modified Medications   No medications on file  Discontinued Medications   No medications on file    Allergies: No Known Allergies  Past Medical History: Past Medical History:  Diagnosis Date  . Anal fissure and fistula    chronic  . Anxiety   . 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 Crohn's disease [facility]) followed by dr [name] [name]   small and large intestines--  hx bowel resection 02/ 2009--  currently treated w/ humira  . Depression   . Essential hypertension, benign   . History of CMV 10/14/2010   resolved w/ antiviral therapy  . History of kidney stones   .  History of resection of small bowel   . Hyperlipidemia   . OSA (obstructive sleep apnea)    01-05-2018 per pt resolved since tonsils removed   . Vitamin D deficiency     Social History: Social History   Socioeconomic History  . Marital status: Divorced    Spouse name: Not on file  . Number of children: 2  . Years of education: 15  . Highest education level: Not on file  Occupational History  . Occupation: Librarian, academic: [employer]  Tobacco Use  . Smoking status: Never Smoker  . Smokeless tobacco: Never Used  Substance and Sexual Activity  . Alcohol use: Yes    Alcohol/week: 3.0 standard drinks    Types: 3 Shots of liquor per week  . Drug use: No  . Sexual activity: Yes    Partners: Male     Comment: Truvada protection  Other Topics Concern  . Not on file  Social History Narrative   HSG, [employer] - 2 years. Married '99 - 30yr/divorced. 2 sons - '98, '00 split custody. 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 No history of physical or sexual abuse.    [place]  Social Determinants of Health   Financial Resource Strain:   . Difficulty of Paying Living Expenses:   Food Insecurity:   . Worried About RCharity fundraiserin the Last Year:   . RArboriculturistin the Last Year:   Transportation Needs:   . LFilm/video editor(Medical):   .Marland KitchenLack of Transportation (Non-Medical):   Physical Activity:   . Days of Exercise per Week:   . Minutes of Exercise per Session:   Stress:   . Feeling of Stress :   Social Connections:   . Frequency of Communication with Friends and Family:   . Frequency of Social Gatherings with Friends and Family:   . Attends Religious Services:   . Active Member of Clubs or Organizations:   . Attends CArchivistMeetings:   .Marland KitchenMarital Status:     No flowsheet data found.  Labs:  SCr: Lab Results  Component Value Date   CREATININE 1.00 07/07/2018   CREATININE 1.00 01/07/2018   CREATININE 0.84 04/02/2017   CREATININE 0.85 09/25/2015   CREATININE 0.89 06/03/2015   HIV Lab Results  Component Value Date   HIV NON-REACTIVE 07/07/2018   HIV NON-REACTIVE 04/02/2017   HIV NONREACTIVE 06/30/2016   HIV NONREACTIVE 10/02/2014   HIV NON REACTIVE 06/30/2013   Hepatitis B Lab Results  Component Value Date   HEPBSAB NEG 09/08/2010   HEPBSAG NEGATIVE 10/17/2010   Hepatitis C No results found for: HEPCAB, HCVRNAPCRQN Hepatitis A No results found for: HAV RPR and STI Lab Results  Component Value Date   LABRPR NON REAC 11/09/2012    No flowsheet data found.  Assessment: [name] here today to establish care with our PrEP clinic and for PrEP follow up.  He has been taking Truvada for  PrEP for about 2 years now, [facility] He was instructed to establish care here for his Truvada prescription. He is insured and has an active copay card for Truvada. [facility] [address]. He has around 15 pills left in his current bottle.   He has a HIV positive partner who he has been with for several years and who sees Dr. HJohnnye Simahere at  our clinic. He rarely has any other partners besides his current one. No issues or concerns today. I discussed our PrEP program here and will see him every 6 months as he only has one stable partner and has been on PrEP for several years. I discussed the possibility of switching to Descovy for better long-term health and he is agreeable. [facility] once his HIV test returns and it is negative. I told him to mychart me if he has any questions.  Of note, patient had annual lab orders placed by Dr. Jenny Reichmann and has a busy schedule which makes it hard to go to different doctor appointments.  Will re-enter the labs today under Dr. Jenny Reichmann and have our office draw all labs for him today.  Plan: - HIV antibody, urine cytology, and RPR today for me - Reordered annual labs for Dr. Jenny Reichmann - Descovy x 6 months if HIV negative - F/u with me in 6 months  Regan Llorente L. Darshawn Boateng, PharmD, BCIDP, AAHIVP, CPP Clinical Pharmacist Practitioner Infectious Diseases [place] [facility] 11/29/2019, 2:39 PM

## 2019-11-30 ENCOUNTER — Encounter: Payer: Self-pay | Admitting: Pharmacist

## 2019-11-30 ENCOUNTER — Other Ambulatory Visit: Payer: Self-pay | Admitting: Pharmacist

## 2019-11-30 DIAGNOSIS — Z79899 Other long term (current) drug therapy: Secondary | ICD-10-CM

## 2019-11-30 LAB — VITAMIN D 25 HYDROXY (VIT D DEFICIENCY, FRACTURES): Vit D, 25-Hydroxy: 13 ng/mL — ABNORMAL LOW (ref 30–100)

## 2019-11-30 LAB — COMPREHENSIVE METABOLIC PANEL
AG Ratio: 1.5 (calc) (ref 1.0–2.5)
ALT: 17 U/L (ref 9–46)
AST: 17 U/L (ref 10–40)
Albumin: 4.5 g/dL (ref 3.6–5.1)
Alkaline phosphatase (APISO): 77 U/L (ref 36–130)
BUN: 14 mg/dL (ref 7–25)
CO2: 24 mmol/L (ref 20–32)
Calcium: 9.3 mg/dL (ref 8.6–10.3)
Chloride: 101 mmol/L (ref 98–110)
Creat: 0.98 mg/dL (ref 0.60–1.35)
Globulin: 3.1 g/dL (calc) (ref 1.9–3.7)
Glucose, Bld: 89 mg/dL (ref 65–99)
Potassium: 4 mmol/L (ref 3.5–5.3)
Sodium: 134 mmol/L — ABNORMAL LOW (ref 135–146)
Total Bilirubin: 0.4 mg/dL (ref 0.2–1.2)
Total Protein: 7.6 g/dL (ref 6.1–8.1)

## 2019-11-30 LAB — PSA: PSA: 0.3 ng/mL (ref <=4.0)

## 2019-11-30 LAB — LIPID PANEL
Cholesterol: 254 mg/dL — ABNORMAL HIGH (ref <200)
HDL: 45 mg/dL (ref >=40)
LDL Cholesterol (Calc): 184 mg/dL (calc) — ABNORMAL HIGH
Non-HDL Cholesterol (Calc): 209 mg/dL (calc) — ABNORMAL HIGH (ref <130)
Total CHOL/HDL Ratio: 5.6 (calc) — ABNORMAL HIGH (ref <5.0)
Triglycerides: 119 mg/dL (ref <150)

## 2019-11-30 LAB — CBC WITH DIFFERENTIAL/PLATELET
Absolute Monocytes: 672 cells/uL (ref 200–950)
Basophils Absolute: 83 cells/uL (ref 0–200)
Basophils Relative: 1 %
Eosinophils Absolute: 183 cells/uL (ref 15–500)
Eosinophils Relative: 2.2 %
HCT: 44 % (ref 38.5–50.0)
Hemoglobin: 14.9 g/dL (ref 13.2–17.1)
Lymphs Abs: 3569 cells/uL (ref 850–3900)
MCH: 32.7 pg (ref 27.0–33.0)
MCHC: 33.9 g/dL (ref 32.0–36.0)
MCV: 96.7 fL (ref 80.0–100.0)
MPV: 11.6 fL (ref 7.5–12.5)
Monocytes Relative: 8.1 %
Neutro Abs: 3793 cells/uL (ref 1500–7800)
Neutrophils Relative %: 45.7 %
Platelets: 302 10*3/uL (ref 140–400)
RBC: 4.55 10*6/uL (ref 4.20–5.80)
RDW: 12.9 % (ref 11.0–15.0)
Total Lymphocyte: 43 %
WBC: 8.3 10*3/uL (ref 3.8–10.8)

## 2019-11-30 LAB — VITAMIN B12: Vitamin B-12: 247 pg/mL (ref 200–1100)

## 2019-11-30 LAB — HEMOGLOBIN A1C
Hgb A1c MFr Bld: 5.3 % of total Hgb (ref <5.7)
Mean Plasma Glucose: 105 (calc)
eAG (mmol/L): 5.8 (calc)

## 2019-11-30 LAB — HIV ANTIBODY (ROUTINE TESTING W REFLEX): HIV 1&2 Ab, 4th Generation: NONREACTIVE

## 2019-11-30 LAB — RPR: RPR Ser Ql: NONREACTIVE

## 2019-11-30 LAB — TSH: TSH: 1.27 mIU/L (ref 0.40–4.50)

## 2019-11-30 MED ORDER — DESCOVY 200-25 MG PO TABS: 1.0000 | ORAL_TABLET | Freq: Every day | ORAL | 5 refills | Status: DC

## 2019-11-30 NOTE — Progress Notes (Signed)
Sending [facility] [place] [facility].

## 2019-12-01 ENCOUNTER — Encounter: Payer: Self-pay | Admitting: Pharmacist

## 2019-12-01 LAB — IRON,TIBC AND FERRITIN PANEL
%SAT: 14 % (calc) — ABNORMAL LOW (ref 20–48)
Ferritin: 9 ng/mL — ABNORMAL LOW (ref 38–380)
Iron: 59 ug/dL (ref 50–180)
TIBC: 430 mcg/dL (calc) — ABNORMAL HIGH (ref 250–425)

## 2019-12-02 ENCOUNTER — Other Ambulatory Visit: Payer: Self-pay | Admitting: Internal Medicine

## 2019-12-02 MED ORDER — VITAMIN D (ERGOCALCIFEROL) 1.25 MG (50000 UNIT) PO CAPS: 50000.0000 [IU] | ORAL_CAPSULE | ORAL | 0 refills | Status: DC

## 2019-12-07 ENCOUNTER — Ambulatory Visit: Payer: BC Managed Care – PPO | Admitting: Psychologist

## 2019-12-10 ENCOUNTER — Other Ambulatory Visit: Payer: Self-pay | Admitting: Internal Medicine

## 2019-12-10 NOTE — Telephone Encounter (Signed)
No refill needed as this medication is not handled by primary care and has been discontinued per emr

## 2019-12-13 ENCOUNTER — Encounter: Payer: Self-pay | Admitting: Internal Medicine

## 2019-12-22 ENCOUNTER — Ambulatory Visit (INDEPENDENT_AMBULATORY_CARE_PROVIDER_SITE_OTHER): Payer: BC Managed Care – PPO | Admitting: Psychologist

## 2019-12-22 DIAGNOSIS — F330 Major depressive disorder, recurrent, mild: Secondary | ICD-10-CM

## 2019-12-22 DIAGNOSIS — F411 Generalized anxiety disorder: Secondary | ICD-10-CM | POA: Diagnosis not present

## 2019-12-28 ENCOUNTER — Ambulatory Visit: Payer: BC Managed Care – PPO | Admitting: Internal Medicine

## 2019-12-28 ENCOUNTER — Other Ambulatory Visit: Payer: BC Managed Care – PPO

## 2019-12-28 ENCOUNTER — Encounter: Payer: Self-pay | Admitting: Internal Medicine

## 2019-12-28 VITALS — BP 90/60 | HR 92 | Ht 67.25 in | Wt 228.0 lb

## 2019-12-28 DIAGNOSIS — K5080 Crohn's disease of both small and large intestine without complications: Secondary | ICD-10-CM

## 2019-12-28 DIAGNOSIS — J42 Unspecified chronic bronchitis: Secondary | ICD-10-CM | POA: Diagnosis not present

## 2019-12-28 DIAGNOSIS — Z79899 Other long term (current) drug therapy: Secondary | ICD-10-CM

## 2019-12-28 DIAGNOSIS — K601 Chronic anal fissure: Secondary | ICD-10-CM | POA: Diagnosis not present

## 2019-12-28 DIAGNOSIS — E66812 Obesity, class 2: Secondary | ICD-10-CM

## 2019-12-28 DIAGNOSIS — E538 Deficiency of other specified B group vitamins: Secondary | ICD-10-CM | POA: Insufficient documentation

## 2019-12-28 DIAGNOSIS — R7989 Other specified abnormal findings of blood chemistry: Secondary | ICD-10-CM

## 2019-12-28 DIAGNOSIS — E611 Iron deficiency: Secondary | ICD-10-CM | POA: Diagnosis not present

## 2019-12-28 DIAGNOSIS — Z7960 Long term (current) use of unspecified immunomodulators and immunosuppressants: Secondary | ICD-10-CM

## 2019-12-28 DIAGNOSIS — E669 Obesity, unspecified: Secondary | ICD-10-CM

## 2019-12-28 DIAGNOSIS — K9089 Other intestinal malabsorption: Secondary | ICD-10-CM

## 2019-12-28 MED ORDER — CYANOCOBALAMIN 1000 MCG/ML IJ SOLN
1000.0000 ug | INTRAMUSCULAR | 3 refills | Status: DC
Start: 2019-12-28 — End: 2020-05-01

## 2019-12-28 NOTE — Assessment & Plan Note (Signed)
Recheck QuantiFERON today Recheck hepatitis B serologies consider vaccination

## 2019-12-28 NOTE — Assessment & Plan Note (Signed)
He is significant risk for malabsorption status post ileal resection.  Will initiate treatment with parenteral injections.  He will purchase the medication and we will arrange for training and repeat injections monthly versus at home.

## 2019-12-28 NOTE — Assessment & Plan Note (Addendum)
I assume this is what drives most of his diarrhea now, continue Lomotil 1 each day refilled today.  Colestipol was problematic in the number of pills and timing.

## 2019-12-28 NOTE — Progress Notes (Signed)
[name] [name] 44 y.o. March 18, 1976 [id number]  Assessment & Plan:  CROHN'S DISEASE, LARGE AND SMALL INTESTINES Continue Humira.  Doing well.  Return in 6 months sooner as needed.  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  Colestipol was problematic in the number of pills and timing.  Iron deficiency Ferrous sulfate 325 mg daily  Low normal vitamin B12 level He is significant risk for malabsorption status post ileal resection.  Will initiate treatment with parenteral injections.  He will purchase the medication and we will arrange for training and repeat injections monthly versus at home.  Obesity, Class II, BMI 35-39.9 We had a significant discussion today about changing eating habits.  He has transition to artificial sweeteners but was surprised he did not lose weight.  I explained that obesity is often a syndrome of elevated insulin insulin resistance and that the artificial sweeteners and sodas and other drinks may still drive insulin up.  He was given information about low-carb and intermittent fasting instructional websites today.  I think he should consider this over time.  It may even help his lipids though it is in conflict with the typical "low-cholesterol" diet unfortunately that leads to too many carbohydrates and metabolically active belly fat which he has.  Evidence shows that low-carb diets which include healthy fats and proteins may actually lower lipids.      Subjective:   Chief Complaint: Follow-up of Crohn's disease on Humira  HPI [name] [name] is here for follow-up of his Crohn's disease, he is maintained on Humira and he takes 1 Lomotil each morning  and that makes life tolerable with about 2-3 bowel movements a day they are controlled and not urgent.  His last colonoscopy in 2020 did not demonstrate any significant inflammation.  At 1 point he was on colestipol but he prefers the 1 Lomotil a day.  He does have some bile salt diarrhea status post ileocolonic resection.  Anal fissure is not so bothersome anymore.  Recently had labs iron is low i.e. ferritin but CBC shows normal hemoglobin.  His B12 is low normal.  He is being treated for vitamin D deficiency.   Partner being seen at [facility] w/ anal cancer, contemplating resection versus radiation therapy  [name] [name] recently spent about 10 weeks in [place] helping his father who was ill and had an amputation.  Sold the house and his father lives here in [place] now. No Known Allergies Current Meds  Medication Sig  . acetaminophen (TYLENOL) 325 MG tablet Take 650 mg by mouth every 6 (six) hours as needed for moderate pain.  Marland Kitchen albuterol (VENTOLIN HFA) 108 (90 Base) MCG/ACT inhaler Inhale 2 puffs into the lungs every 6 (six) hours as needed for wheezing or shortness of breath.  . ALPRAZolam (XANAX XR) 3 MG 24 hr tablet TAKE 1 TABLET (3 MG TOTAL) BY MOUTH EVERY MORNING.  Marland Kitchen alprazolam (XANAX) 2 MG tablet TAKE 1/2 TAB BY MOUTH TWICE DAILY AS NEEDED  . cetirizine (ZYRTEC) 10 MG tablet Take 10 mg by mouth daily.  . colestipol (COLESTID) 1 g tablet TAKE 5 TABLETS BY MOUTH DAILY (Patient taking differently: TAKE 5 TABLETS BY MOUTH DAILY--- takes in evening)  . diphenoxylate-atropine (LOMOTIL) 2.5-0.025 MG tablet TAKE 1 TABLET  BY MOUTH FOUR TIMES A DAY AS NEEDED FOR DIARRHEA / LOOSE STOOLS  . DULoxetine (CYMBALTA) 60 MG capsule TAKE 1 CAPSULE (60 MG TOTAL) BY MOUTH 2 (TWO) TIMES DAILY.  Marland Kitchen emtricitabine-tenofovir AF (DESCOVY) 200-25 MG tablet Take 1 tablet by mouth daily.  Marland Kitchen HUMIRA PEN 40 MG/0.4ML PNKT INJECT 40 MG INTO THE SKIN ONCE A WEEK  . lisinopril-hydrochlorothiazide (ZESTORETIC) 20-25 MG tablet TAKE 1  TABLET BY MOUTH EVERY DAY  . ondansetron (ZOFRAN) 4 MG tablet TAKE 1 TAB BY MOUTH EVERY 6 HOURS  . Vitamin D, Ergocalciferol, (DRISDOL) 1.25 MG (50000 UNIT) CAPS capsule Take 1 capsule (50,000 Units total) by mouth every 7 (seven) days.  Marland Kitchen zolpidem (AMBIEN) 10 MG tablet Take 1 tablet (10 mg total) by mouth at bedtime as needed for sleep.   Past Medical History:  Diagnosis Date  . Anal fissure and fistula    chronic  . Anxiety   . 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 Crohn's disease [facility]) followed by dr [name] [name]   small and large intestines--  hx bowel resection 02/ 2009--  currently treated w/ humira  . Depression   . Essential hypertension, benign   . History of CMV 10/14/2010   resolved w/ antiviral therapy  . History of kidney stones   . History of resection of small bowel   . Hyperlipidemia   . OSA (obstructive sleep apnea)    01-05-2018 per pt resolved since tonsils removed   . Vitamin D deficiency    Past Surgical History:  Procedure Laterality Date  . ANAL FISTULOTOMY N/A 01/07/2018   Procedure: ANAL FISTULOTOMY;  Surgeon: [name] Ruff, MD;  Location: [facility];  Service: General;  Laterality: N/A;  . COLONOSCOPY  last one 04-2017   dr [name] [name]  . EXPLORATORY LAPAROTOMY W/ ILEOSECECTOMY W/ PRIMARY ANASTAMOSIS  08-05-2007    dr [name]  [facility]   crohn's, sbo, perferation's  . GANGLION CYST EXCISION Right 2016   wrist  . HYPOSPADIAS CORRECTION  CHILD  . SPHINCTEROTOMY N/A 01/07/2018   Procedure: CHEMICAL SPHINCTEROTOMY (BOTOX);  Surgeon: [name] Ruff, MD;  Location: [facility];  Service: General;  Laterality: N/A;  . TONSILLECTOMY Bilateral 09/30/2015   Procedure: BILATERAL TONSILLECTOMY;  Surgeon: [name] Gala, MD;  Location: [place];  Service: ENT;  Laterality: Bilateral;  . UPPER GASTROINTESTINAL ENDOSCOPY  10/15/2010   w/biopsy, mild gastritis and duodenitis   Social History   Social History Narrative   HSG,  [employer] - 2 years. Married '99 - 63yr/divorced. 2 sons - '98, '00 split custody. 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 No history of physical or sexual abuse.    WAgustina CaroliMortgage   family history includes Allergies in his mother and sister; Asthma in his sister; Cancer in his mother; Gout in his father; Heart disease in his father; Hyperlipidemia in his father; Hypertension in his father; Obesity in his mother; Sleep apnea in his father; Stroke in his maternal grandfather.   Review of Systems As per HPI  Objective:   Physical Exam BP 90/60 (BP Location: Left Arm, Patient Position: Sitting, Cuff Size: Normal)   Pulse 92   Ht 5' 7.25" (1.708 m)   Wt 228 lb (103.4 kg)   BMI 35.44 kg/m  Obese NAD Lungs cta Cor NL abd obese soft and NT ? Small venral hernia vs diastasis BS + Rectal - small pinpoint hole r post and suspect fissure -

## 2019-12-28 NOTE — Patient Instructions (Addendum)
   Regarding changing eating habits and losing weight with low carb and intermittent fasting Check out [email or web address].[email or web address]   Also the Fasting Method Dr. Sharman Cheek  Purchase over the counter ferrous sulfate 330m and take one daily by mouth.  We will send in B12 and we can set up a monthly appointment to get your shot done here.  Follow up with Dr GCarlean Purlin 6 months.   I appreciate the opportunity to care for you. CSilvano Rusk MD, [facility]

## 2019-12-28 NOTE — Assessment & Plan Note (Signed)
Continue Humira.  Doing well.  Return in 6 months sooner as needed.

## 2019-12-28 NOTE — Assessment & Plan Note (Signed)
Ferrous sulfate 325 mg daily

## 2019-12-28 NOTE — Assessment & Plan Note (Signed)
We had a significant discussion today about changing eating habits.  He has transition to artificial sweeteners but was surprised he did not lose weight.  I explained that obesity is often a syndrome of elevated insulin insulin resistance and that the artificial sweeteners and sodas and other drinks may still drive insulin up.  He was given information about low-carb and intermittent fasting instructional websites today.  I think he should consider this over time.  It may even help his lipids though it is in conflict with the typical "low-cholesterol" diet unfortunately that leads to too many carbohydrates and metabolically active belly fat which he has.  Evidence shows that low-carb diets which include healthy fats and proteins may actually lower lipids.

## 2019-12-28 NOTE — Assessment & Plan Note (Signed)
Observe not much of a problem right now

## 2019-12-29 ENCOUNTER — Other Ambulatory Visit: Payer: Self-pay | Admitting: Internal Medicine

## 2019-12-29 MED ORDER — ZOLPIDEM TARTRATE 10 MG PO TABS: 10.0000 mg | ORAL_TABLET | Freq: Every evening | ORAL | 5 refills | Status: DC | PRN

## 2019-12-29 NOTE — Telephone Encounter (Signed)
Done erx 

## 2019-12-30 LAB — QUANTIFERON-TB GOLD PLUS
Mitogen-NIL: >10 IU/mL
NIL: 0.12 IU/mL
QuantiFERON-TB Gold Plus: NEGATIVE
TB1-NIL: 0.06 IU/mL
TB2-NIL: 0.06 IU/mL

## 2019-12-30 LAB — HEPATITIS B SURFACE ANTIGEN: Hepatitis B Surface Ag: NONREACTIVE

## 2019-12-30 LAB — HEPATITIS B SURFACE ANTIBODY,QUALITATIVE: Hep B S Ab: NONREACTIVE

## 2019-12-30 LAB — HEPATITIS B CORE ANTIBODY, TOTAL: Hep B Core Total Ab: NONREACTIVE

## 2020-01-02 ENCOUNTER — Other Ambulatory Visit: Payer: Self-pay

## 2020-01-02 DIAGNOSIS — Z23 Encounter for immunization: Secondary | ICD-10-CM

## 2020-01-03 ENCOUNTER — Ambulatory Visit (INDEPENDENT_AMBULATORY_CARE_PROVIDER_SITE_OTHER): Payer: BC Managed Care – PPO | Admitting: Internal Medicine

## 2020-01-03 ENCOUNTER — Telehealth: Payer: Self-pay

## 2020-01-03 DIAGNOSIS — E538 Deficiency of other specified B group vitamins: Secondary | ICD-10-CM | POA: Diagnosis not present

## 2020-01-03 DIAGNOSIS — Z23 Encounter for immunization: Secondary | ICD-10-CM | POA: Diagnosis not present

## 2020-01-03 MED ORDER — CYANOCOBALAMIN 1000 MCG/ML IJ SOLN
1000.0000 ug | INTRAMUSCULAR | Status: DC
  Administered 2020-01-03 – 2020-02-05 (×2): 1000 ug via INTRAMUSCULAR

## 2020-01-03 NOTE — Telephone Encounter (Signed)
[name] [name] came in today and I gave him his fist Heplisav and his first monthly B12.   He is requesting a refill on his lomotil and he also wants to know if he can take his Humira today. Please advise Sir, thank you.

## 2020-01-03 NOTE — Telephone Encounter (Signed)
No answer needed back on this , it has already been taken care of Sir.

## 2020-01-04 ENCOUNTER — Ambulatory Visit (INDEPENDENT_AMBULATORY_CARE_PROVIDER_SITE_OTHER): Payer: BC Managed Care – PPO | Admitting: Psychologist

## 2020-01-04 DIAGNOSIS — F411 Generalized anxiety disorder: Secondary | ICD-10-CM

## 2020-01-04 DIAGNOSIS — F330 Major depressive disorder, recurrent, mild: Secondary | ICD-10-CM | POA: Diagnosis not present

## 2020-01-09 ENCOUNTER — Ambulatory Visit (INDEPENDENT_AMBULATORY_CARE_PROVIDER_SITE_OTHER): Payer: BC Managed Care – PPO | Admitting: Psychologist

## 2020-01-09 DIAGNOSIS — F330 Major depressive disorder, recurrent, mild: Secondary | ICD-10-CM | POA: Diagnosis not present

## 2020-01-09 DIAGNOSIS — F411 Generalized anxiety disorder: Secondary | ICD-10-CM | POA: Diagnosis not present

## 2020-01-18 ENCOUNTER — Ambulatory Visit (INDEPENDENT_AMBULATORY_CARE_PROVIDER_SITE_OTHER): Payer: BC Managed Care – PPO | Admitting: Psychologist

## 2020-01-18 DIAGNOSIS — F411 Generalized anxiety disorder: Secondary | ICD-10-CM | POA: Diagnosis not present

## 2020-01-18 DIAGNOSIS — F330 Major depressive disorder, recurrent, mild: Secondary | ICD-10-CM

## 2020-01-22 NOTE — Telephone Encounter (Signed)
Some RLQ pain - not severe and sensitive to touch only Has had some nausea and vomiting All in past 24 hrs or so No fever  He is moving his bowels w/o sig diarrhea   Advised liquid diet Use phenergan If sig worsening can go to ED otherwise update me tomorrow

## 2020-01-23 ENCOUNTER — Other Ambulatory Visit: Payer: Self-pay

## 2020-01-23 ENCOUNTER — Encounter (HOSPITAL_COMMUNITY): Payer: Self-pay

## 2020-01-23 ENCOUNTER — Telehealth (HOSPITAL_COMMUNITY): Payer: Self-pay

## 2020-01-23 ENCOUNTER — Ambulatory Visit (HOSPITAL_COMMUNITY)
Admission: EM | Admit: 2020-01-23 | Discharge: 2020-01-23 | Disposition: A | Discharge status: Home / Self Care | Payer: BC Managed Care – PPO | Attending: Family Medicine | Admitting: Family Medicine

## 2020-01-23 DIAGNOSIS — G8929 Other chronic pain: Secondary | ICD-10-CM | POA: Diagnosis not present

## 2020-01-23 DIAGNOSIS — K50119 Crohn's disease of large intestine with unspecified complications: Secondary | ICD-10-CM

## 2020-01-23 DIAGNOSIS — K5080 Crohn's disease of both small and large intestine without complications: Secondary | ICD-10-CM

## 2020-01-23 DIAGNOSIS — R1031 Right lower quadrant pain: Secondary | ICD-10-CM

## 2020-01-23 MED ORDER — OXYCODONE-ACETAMINOPHEN 5-325 MG PO TABS: 1.0000 | ORAL_TABLET | ORAL | 0 refills | Status: DC | PRN

## 2020-01-23 MED ORDER — PREDNISONE 10 MG PO TABS: ORAL_TABLET | ORAL | 0 refills | Status: DC

## 2020-01-23 MED ORDER — OXYCODONE-ACETAMINOPHEN 5-325 MG PO TABS: 2.0000 | ORAL_TABLET | ORAL | 0 refills | Status: DC | PRN

## 2020-01-23 NOTE — ED Provider Notes (Signed)
[place]    CSN: [id number] Arrival date & time: 01/23/20  1806      History   Chief Complaint Chief Complaint  Patient presents with  . Right Side Pain    HPI [name] [name] is a 44 y.o. male.   HPI   Patient is here for right lower quadrant abdominal pain.  Is been worse for the last 3 days.  Nausea but no vomiting.  No fever or chills.  He has chronic diarrhea, unchanged.  No blood or mucus in the bowels.  He is under the care of gastroenterology.  Also has a primary care doctor.  Has not had a flare of his Crohn's disease for a long time, he is on Humira.  He is having a flare now, probably because of stress at home and stress at work. Very pleasant gentleman.  Compliant with his medical care.  Works from home  Past Medical History:  Diagnosis Date  . Anal fissure and fistula    chronic  . Anxiety   . 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 Crohn's disease [facility]) followed by dr Carlean Purl   small and large intestines--  hx bowel resection 02/ 2009--  currently treated w/ humira  . Depression   . Essential hypertension, benign   . History of CMV 10/14/2010   resolved w/ antiviral therapy  . History of kidney stones   . History of resection of small bowel   . Hyperlipidemia   . OSA (obstructive sleep apnea)    01-05-2018 per pt resolved since tonsils removed   . Vitamin D deficiency     Patient Active Problem List   Diagnosis Date Noted  . Iron deficiency 12/28/2019  . Low normal vitamin B12 level 12/28/2019  . Dyspnea 10/16/2019  . Encounter for screening for HIV 07/07/2018  . Hyperglycemia 07/07/2018  . Bronchitis 06/29/2018  . 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 Perianal dermatitis 10/26/2017  . Encounter for well adult exam with abnormal findings 05/10/2017  . High risk sexual behavior 06/30/2016  . Folliculitis 76/73/4193  . Ganglion cyst of flexor tendon sheath 11/02/2014  .  Chronic posterior anal fissure 02/15/2014  . Tinea pedis 06/27/2013  . Long-term use of immunosuppressant medication - Humira 10/14/2010  . Obesity, Class II, BMI 35-39.9 07/20/2007  . Anxiety with depression 07/20/2007  . Insomnia 07/20/2007  . OSA (obstructive sleep apnea) 07/20/2007  . Essential hypertension, benign 07/20/2007  . CROHN'S DISEASE, LARGE AND SMALL INTESTINES 07/20/2007    Past Surgical History:  Procedure Laterality Date  . ANAL FISTULOTOMY N/A 01/07/2018   Procedure: ANAL FISTULOTOMY;  Surgeon: Leighton Ruff, MD;  Location: [facility];  Service: General;  Laterality: N/A;  . COLONOSCOPY  last one 04-2017   dr Carlean Purl  . EXPLORATORY LAPAROTOMY W/ ILEOSECECTOMY W/ PRIMARY ANASTAMOSIS  08-05-2007    dr gross  [facility]   crohn's, sbo, perferation's  . GANGLION CYST EXCISION Right 2016   wrist  . HYPOSPADIAS CORRECTION  CHILD  . SPHINCTEROTOMY N/A 01/07/2018   Procedure: CHEMICAL SPHINCTEROTOMY (BOTOX);  Surgeon: Leighton Ruff, MD;  Location: [facility];  Service: General;  Laterality: N/A;  . TONSILLECTOMY Bilateral 09/30/2015   Procedure: BILATERAL TONSILLECTOMY;  Surgeon: Izora Gala, MD;  Location: [place];  Service: ENT;  Laterality: Bilateral;  . UPPER GASTROINTESTINAL ENDOSCOPY  10/15/2010   w/biopsy, mild gastritis and duodenitis       Home Medications    Prior to  Admission medications   Medication Sig Start Date End Date Taking? Authorizing Provider  acetaminophen (TYLENOL) 325 MG tablet Take 650 mg by mouth every 6 (six) hours as needed for moderate pain.    [provider]  albuterol (VENTOLIN HFA) 108 (90 Base) MCG/ACT inhaler Inhale 2 puffs into the lungs every 6 (six) hours as needed for wheezing or shortness of breath. 10/16/19   Biagio Borg, MD  ALPRAZolam (XANAX XR) 3 MG 24 hr tablet TAKE 1 TABLET (3 MG TOTAL) BY MOUTH EVERY MORNING. 07/31/19   Biagio Borg, MD  alprazolam Duanne Moron) 2 MG tablet TAKE 1/2 TAB BY MOUTH  TWICE DAILY AS NEEDED 07/31/19   Biagio Borg, MD  cetirizine (ZYRTEC) 10 MG tablet Take 10 mg by mouth daily.    [provider]  cyanocobalamin (,VITAMIN B-12,) 1000 MCG/ML injection Inject 1 mL (1,000 mcg total) into the muscle every 30 (thirty) days. 12/28/19   Gatha Mayer, MD  diphenoxylate-atropine (LOMOTIL) 2.5-0.025 MG tablet TAKE 1 TABLET BY MOUTH FOUR TIMES A DAY AS NEEDED FOR DIARRHEA / LOOSE STOOLS 08/14/19   Gatha Mayer, MD  DULoxetine (CYMBALTA) 60 MG capsule TAKE 1 CAPSULE (60 MG TOTAL) BY MOUTH 2 (TWO) TIMES DAILY. 05/26/19   Biagio Borg, MD  emtricitabine-tenofovir AF (DESCOVY) 200-25 MG tablet Take 1 tablet by mouth daily. 11/30/19   Kuppelweiser, Cassie L, RPH-CPP  HUMIRA PEN 40 MG/0.4ML PNKT INJECT 40 MG INTO THE SKIN ONCE A WEEK 12/14/18   Gatha Mayer, MD  lisinopril-hydrochlorothiazide (ZESTORETIC) 20-25 MG tablet TAKE 1 TABLET BY MOUTH EVERY DAY 05/26/19   Biagio Borg, MD  ondansetron (ZOFRAN) 4 MG tablet TAKE 1 TAB BY MOUTH EVERY 6 HOURS 11/17/18   Gatha Mayer, MD  oxyCODONE-acetaminophen (PERCOCET/ROXICET) 5-325 MG tablet Take 1-2 tablets by mouth every 4 (four) hours as needed for severe pain. 01/23/20   Raylene Everts, MD  predniSONE (DELTASONE) 10 MG tablet Take 40 mg daily for 3 days then taper as tolerated by 10 mg every 3 days 01/23/20   Raylene Everts, MD  predniSONE (DELTASONE) 10 MG tablet Take 40 mg a day for 3 days.  Taper as directed 01/23/20   Raylene Everts, MD  Vitamin D, Ergocalciferol, (DRISDOL) 1.25 MG (50000 UNIT) CAPS capsule Take 1 capsule (50,000 Units total) by mouth every 7 (seven) days. 12/02/19   Biagio Borg, MD  zolpidem (AMBIEN) 10 MG tablet Take 1 tablet (10 mg total) by mouth at bedtime as needed. for sleep 12/29/19   Biagio Borg, MD    Family History Family History  Problem Relation Age of Onset  . Heart disease Father        CAD/MI  . Hyperlipidemia Father   . Hypertension Father   . Gout Father   . Sleep  apnea Father   . Obesity Mother   . Allergies Mother   . Cancer Mother   . Stroke Maternal Grandfather   . Allergies Sister   . Asthma Sister   . Colon cancer Neg Hx   . Diabetes Neg Hx   . COPD Neg Hx   . Colon polyps Neg Hx     Social History Social History   Tobacco Use  . Smoking status: Never Smoker  . Smokeless tobacco: Never Used  Vaping Use  . Vaping Use: Never used  Substance Use Topics  . Alcohol use: Yes    Alcohol/week: 3.0 standard drinks    Types:  3 Shots of liquor per week  . Drug use: No     Allergies   Patient has no known allergies.   Review of Systems Review of Systems See HPI  Physical Exam Triage Vital Signs ED Triage Vitals  Enc Vitals Group     BP 01/23/20 1901 115/70     Pulse Rate 01/23/20 1901 83     Resp 01/23/20 1901 18     Temp 01/23/20 1901 98 F (36.7 C)     Temp Source 01/23/20 1901 Oral     SpO2 01/23/20 1901 94 %     Weight --      Height --      Head Circumference --      Peak Flow --      Pain Score 01/23/20 1900 6     Pain Loc --      Pain Edu? --      Excl. in [place]? --    No data found.  Updated Vital Signs BP 115/70 (BP Location: Right Arm)   Pulse 83   Temp 98 F (36.7 C) (Oral)   Resp 18   SpO2 94%      Physical Exam Constitutional:      General: He is not in acute distress.    Appearance: Normal appearance. He is well-developed.     Comments: Overweight  HENT:     Head: Normocephalic and atraumatic.     Right Ear: Tympanic membrane, ear canal and external ear normal.     Left Ear: Tympanic membrane, ear canal and external ear normal.     Nose: Nose normal.     Mouth/Throat:     Pharynx: No posterior oropharyngeal erythema.  Eyes:     Conjunctiva/sclera: Conjunctivae normal.     Pupils: Pupils are equal, round, and reactive to light.  Cardiovascular:     Rate and Rhythm: Normal rate and regular rhythm.     Heart sounds: Normal heart sounds.  Pulmonary:     Effort: Pulmonary effort is normal.  No respiratory distress.     Breath sounds: Normal breath sounds. No wheezing or rales.  Abdominal:     General: There is no distension.     Palpations: Abdomen is soft.     Tenderness: There is abdominal tenderness.     Comments: Rounded abdomen.  Soft.  Tenderness in the right lower quadrant, acute.  Mild rebound.  No guarding.  No organomegaly or mass palpable  Musculoskeletal:        General: Normal range of motion.     Cervical back: Normal range of motion and neck supple.  Skin:    General: Skin is warm and dry.  Neurological:     Mental Status: He is alert.  Psychiatric:        Mood and Affect: Mood normal.        Behavior: Behavior normal.      UC Treatments / Results  Labs (all labs ordered are listed, but only abnormal results are displayed) Labs Reviewed - No data to display  EKG   Radiology No results found.  Procedures Procedures (including critical care time)  Medications Ordered in UC Medications - No data to display  Initial Impression / Assessment and Plan / UC Course  I have reviewed the triage vital signs and the nursing notes.  Pertinent labs & imaging results that were available during my care of the patient were reviewed by me and considered in my medical decision making (see  chart for details).     We will treat with prednisone.  Percocet which is what he usually gets. Advised not to take Percocet with his benzodiazepines. Final Clinical Impressions(s) / UC Diagnoses   Final diagnoses:  Crohn's disease of colon with complication ([place])  Abdominal pain, chronic, right lower quadrant     Discharge Instructions     Prednisone 40 mg a day Percocet for pain Call your doctor tomorrow   ED Prescriptions    Medication Sig Dispense Auth. Provider   oxyCODONE-acetaminophen (PERCOCET/ROXICET) 5-325 MG tablet  (Status: Discontinued) Take 2 tablets by mouth every 4 (four) hours as needed for severe pain. 30 tablet Raylene Everts, MD    predniSONE (DELTASONE) 10 MG tablet Take 40 mg daily for 3 days then taper as tolerated by 10 mg every 3 days 40 tablet Raylene Everts, MD   oxyCODONE-acetaminophen (PERCOCET/ROXICET) 5-325 MG tablet Take 1-2 tablets by mouth every 4 (four) hours as needed for severe pain. 30 tablet Raylene Everts, MD   predniSONE (DELTASONE) 10 MG tablet Take 40 mg a day for 3 days.  Taper as directed 40 tablet Raylene Everts, MD     I have reviewed the PDMP during this encounter.   Raylene Everts, MD 01/23/20 2035

## 2020-01-23 NOTE — ED Triage Notes (Signed)
Pt presents with right side abdominal pain and nausea X 3 days.  Pt has crohns disease.

## 2020-01-23 NOTE — Discharge Instructions (Addendum)
Prednisone 40 mg a day Percocet for pain Call your doctor tomorrow

## 2020-01-24 ENCOUNTER — Telehealth (HOSPITAL_COMMUNITY): Payer: Self-pay

## 2020-01-24 NOTE — Telephone Encounter (Signed)
I called to cancel Oxycodone 5-325MG that was sent to [facility] in [place], Dr.Sue Meda Coffee sent over another script to the [facility] that was open for him to pick up on 01/23/2020.

## 2020-01-25 ENCOUNTER — Other Ambulatory Visit: Payer: Self-pay

## 2020-01-25 DIAGNOSIS — K5080 Crohn's disease of both small and large intestine without complications: Secondary | ICD-10-CM

## 2020-01-29 ENCOUNTER — Other Ambulatory Visit: Payer: Self-pay | Admitting: Internal Medicine

## 2020-01-29 ENCOUNTER — Encounter: Payer: Self-pay | Admitting: Internal Medicine

## 2020-01-29 ENCOUNTER — Ambulatory Visit (INDEPENDENT_AMBULATORY_CARE_PROVIDER_SITE_OTHER): Payer: BC Managed Care – PPO | Admitting: Psychologist

## 2020-01-29 DIAGNOSIS — F330 Major depressive disorder, recurrent, mild: Secondary | ICD-10-CM | POA: Diagnosis not present

## 2020-01-29 DIAGNOSIS — F411 Generalized anxiety disorder: Secondary | ICD-10-CM

## 2020-01-29 NOTE — Telephone Encounter (Signed)
Sent to Dr. John. 

## 2020-01-29 NOTE — Telephone Encounter (Signed)
Done erx 

## 2020-02-02 ENCOUNTER — Other Ambulatory Visit: Payer: Self-pay | Admitting: Internal Medicine

## 2020-02-02 MED ORDER — OXYCODONE-ACETAMINOPHEN 5-325 MG PO TABS: 1.0000 | ORAL_TABLET | ORAL | 0 refills | Status: DC | PRN

## 2020-02-05 ENCOUNTER — Other Ambulatory Visit (HOSPITAL_COMMUNITY): Payer: BC Managed Care – PPO

## 2020-02-05 ENCOUNTER — Ambulatory Visit (INDEPENDENT_AMBULATORY_CARE_PROVIDER_SITE_OTHER): Payer: BC Managed Care – PPO | Admitting: Internal Medicine

## 2020-02-05 ENCOUNTER — Other Ambulatory Visit: Payer: Self-pay

## 2020-02-05 ENCOUNTER — Telehealth: Payer: Self-pay

## 2020-02-05 DIAGNOSIS — K5080 Crohn's disease of both small and large intestine without complications: Secondary | ICD-10-CM

## 2020-02-05 DIAGNOSIS — Z23 Encounter for immunization: Secondary | ICD-10-CM | POA: Diagnosis not present

## 2020-02-05 DIAGNOSIS — E538 Deficiency of other specified B group vitamins: Secondary | ICD-10-CM | POA: Diagnosis not present

## 2020-02-05 DIAGNOSIS — E611 Iron deficiency: Secondary | ICD-10-CM

## 2020-02-05 NOTE — Telephone Encounter (Signed)
Patient came in today for his 2nd Heplisav and monthly B12 injection. He was inquiring how long the monthly B12 injection will last.  Please advise. Thank you

## 2020-02-07 ENCOUNTER — Ambulatory Visit (INDEPENDENT_AMBULATORY_CARE_PROVIDER_SITE_OTHER): Payer: BC Managed Care – PPO | Admitting: Psychologist

## 2020-02-07 DIAGNOSIS — F330 Major depressive disorder, recurrent, mild: Secondary | ICD-10-CM | POA: Diagnosis not present

## 2020-02-07 DIAGNOSIS — F411 Generalized anxiety disorder: Secondary | ICD-10-CM | POA: Diagnosis not present

## 2020-02-07 NOTE — Telephone Encounter (Signed)
mychart message has been sent to the patient and booked the next monthly injection

## 2020-02-07 NOTE — Telephone Encounter (Signed)
lifelong

## 2020-02-13 ENCOUNTER — Encounter (HOSPITAL_COMMUNITY): Payer: Self-pay

## 2020-02-13 ENCOUNTER — Other Ambulatory Visit (INDEPENDENT_AMBULATORY_CARE_PROVIDER_SITE_OTHER): Payer: BC Managed Care – PPO

## 2020-02-13 ENCOUNTER — Other Ambulatory Visit: Payer: Self-pay

## 2020-02-13 ENCOUNTER — Other Ambulatory Visit: Payer: Self-pay | Admitting: Internal Medicine

## 2020-02-13 ENCOUNTER — Ambulatory Visit (HOSPITAL_COMMUNITY)
Admission: RE | Admit: 2020-02-13 | Discharge: 2020-02-13 | Disposition: A | Discharge status: Home / Self Care | Payer: BC Managed Care – PPO | Source: Ambulatory Visit | Attending: Internal Medicine | Admitting: Internal Medicine

## 2020-02-13 DIAGNOSIS — Z7960 Long term (current) use of unspecified immunomodulators and immunosuppressants: Secondary | ICD-10-CM

## 2020-02-13 DIAGNOSIS — K5080 Crohn's disease of both small and large intestine without complications: Secondary | ICD-10-CM

## 2020-02-13 DIAGNOSIS — Z79899 Other long term (current) drug therapy: Secondary | ICD-10-CM

## 2020-02-13 LAB — CBC
HCT: 44.8 % (ref 39.0–52.0)
Hemoglobin: 15 g/dL (ref 13.0–17.0)
MCHC: 33.6 g/dL (ref 30.0–36.0)
MCV: 98.5 fl (ref 78.0–100.0)
Platelets: 231 10*3/uL (ref 150.0–400.0)
RBC: 4.55 Mil/uL (ref 4.22–5.81)
RDW: 15.3 % (ref 11.5–15.5)
WBC: 8.5 10*3/uL (ref 4.0–10.5)

## 2020-02-13 LAB — C-REACTIVE PROTEIN: CRP: <1 mg/dL (ref 0.5–20.0)

## 2020-02-13 LAB — POCT I-STAT CREATININE: Creatinine, Ser: 1.2 mg/dL (ref 0.61–1.24)

## 2020-02-13 MED ORDER — OXYCODONE-ACETAMINOPHEN 5-325 MG PO TABS: 1.0000 | ORAL_TABLET | ORAL | 0 refills | Status: DC | PRN

## 2020-02-13 MED ORDER — BARIUM SULFATE 0.1 % PO SUSP
ORAL | Status: AC
  Filled 2020-02-13: qty 3

## 2020-02-13 MED ORDER — SODIUM CHLORIDE (PF) 0.9 % IJ SOLN
INTRAMUSCULAR | Status: AC
  Filled 2020-02-13: qty 50

## 2020-02-13 MED ORDER — BUDESONIDE 3 MG PO CPEP: 9.0000 mg | ORAL_CAPSULE | Freq: Every day | ORAL | 1 refills | Status: DC

## 2020-02-13 MED ORDER — IOHEXOL 300 MG/ML  SOLN
100.0000 mL | Freq: Once | INTRAMUSCULAR | Status: AC | PRN
  Administered 2020-02-13: 100 mL via INTRAVENOUS

## 2020-02-16 ENCOUNTER — Ambulatory Visit (INDEPENDENT_AMBULATORY_CARE_PROVIDER_SITE_OTHER): Payer: BC Managed Care – PPO | Admitting: Psychologist

## 2020-02-16 DIAGNOSIS — F330 Major depressive disorder, recurrent, mild: Secondary | ICD-10-CM

## 2020-02-16 DIAGNOSIS — F411 Generalized anxiety disorder: Secondary | ICD-10-CM | POA: Diagnosis not present

## 2020-02-18 LAB — SERIAL MONITORING

## 2020-02-19 LAB — ADALIMUMAB+AB (SERIAL MONITOR)
Adalimumab Drug Level: 8.4 ug/mL
Anti-Adalimumab Antibody: <25 ng/mL

## 2020-02-21 ENCOUNTER — Other Ambulatory Visit: Payer: Self-pay | Admitting: Internal Medicine

## 2020-02-21 NOTE — Telephone Encounter (Signed)
Please change to OTC Vitamin D3 at 2000 units per day, indefinitely.  

## 2020-02-22 ENCOUNTER — Other Ambulatory Visit: Payer: Self-pay | Admitting: Internal Medicine

## 2020-02-22 MED ORDER — OXYCODONE-ACETAMINOPHEN 10-325 MG PO TABS: 1.0000 | ORAL_TABLET | Freq: Four times a day (QID) | ORAL | 0 refills | Status: DC | PRN

## 2020-02-28 NOTE — Telephone Encounter (Signed)
Hi Sheri, pt just called requesting rf for pain med before Saturday as he will be going out of town and will not return until 9/18. He also r/s B12 injection to this Friday 9/10 at 10:00am.

## 2020-02-29 ENCOUNTER — Ambulatory Visit: Payer: BC Managed Care – PPO | Admitting: Internal Medicine

## 2020-03-01 ENCOUNTER — Other Ambulatory Visit: Payer: Self-pay | Admitting: Internal Medicine

## 2020-03-01 ENCOUNTER — Ambulatory Visit (INDEPENDENT_AMBULATORY_CARE_PROVIDER_SITE_OTHER): Payer: BC Managed Care – PPO | Admitting: Psychologist

## 2020-03-01 ENCOUNTER — Encounter: Payer: BC Managed Care – PPO | Admitting: Internal Medicine

## 2020-03-01 DIAGNOSIS — F330 Major depressive disorder, recurrent, mild: Secondary | ICD-10-CM | POA: Diagnosis not present

## 2020-03-01 DIAGNOSIS — F411 Generalized anxiety disorder: Secondary | ICD-10-CM | POA: Diagnosis not present

## 2020-03-01 DIAGNOSIS — E538 Deficiency of other specified B group vitamins: Secondary | ICD-10-CM

## 2020-03-01 MED ORDER — OXYCODONE-ACETAMINOPHEN 10-325 MG PO TABS: 1.0000 | ORAL_TABLET | Freq: Four times a day (QID) | ORAL | 0 refills | Status: DC | PRN

## 2020-03-01 MED ORDER — HYOSCYAMINE SULFATE ER 0.375 MG PO TB12: 0.3750 mg | ORAL_TABLET | Freq: Two times a day (BID) | ORAL | 0 refills | Status: DC

## 2020-03-01 NOTE — Progress Notes (Signed)
[facility] [place] Needs refill due to leaving town

## 2020-03-07 ENCOUNTER — Other Ambulatory Visit: Payer: Self-pay | Admitting: Internal Medicine

## 2020-03-14 ENCOUNTER — Emergency Department (HOSPITAL_COMMUNITY): Payer: BC Managed Care – PPO

## 2020-03-14 ENCOUNTER — Encounter (HOSPITAL_COMMUNITY): Payer: Self-pay

## 2020-03-14 ENCOUNTER — Other Ambulatory Visit: Payer: Self-pay

## 2020-03-14 ENCOUNTER — Emergency Department (HOSPITAL_COMMUNITY)
Admission: EM | Admit: 2020-03-14 | Discharge: 2020-03-14 | Disposition: A | Discharge status: Home / Self Care | Payer: BC Managed Care – PPO | Attending: Emergency Medicine | Admitting: Emergency Medicine

## 2020-03-14 DIAGNOSIS — I10 Essential (primary) hypertension: Secondary | ICD-10-CM | POA: Diagnosis not present

## 2020-03-14 DIAGNOSIS — R112 Nausea with vomiting, unspecified: Secondary | ICD-10-CM | POA: Diagnosis not present

## 2020-03-14 DIAGNOSIS — R1031 Right lower quadrant pain: Secondary | ICD-10-CM | POA: Insufficient documentation

## 2020-03-14 DIAGNOSIS — R509 Fever, unspecified: Secondary | ICD-10-CM | POA: Insufficient documentation

## 2020-03-14 DIAGNOSIS — Z20822 Contact with and (suspected) exposure to covid-19: Secondary | ICD-10-CM | POA: Diagnosis not present

## 2020-03-14 DIAGNOSIS — Z79899 Other long term (current) drug therapy: Secondary | ICD-10-CM | POA: Diagnosis not present

## 2020-03-14 DIAGNOSIS — R1084 Generalized abdominal pain: Secondary | ICD-10-CM

## 2020-03-14 LAB — CBC WITH DIFFERENTIAL/PLATELET
Abs Immature Granulocytes: 0.04 10*3/uL (ref 0.00–0.07)
Basophils Absolute: 0.1 10*3/uL (ref 0.0–0.1)
Basophils Relative: 1 %
Eosinophils Absolute: 0.1 10*3/uL (ref 0.0–0.5)
Eosinophils Relative: 1 %
HCT: 45.8 % (ref 39.0–52.0)
Hemoglobin: 15.5 g/dL (ref 13.0–17.0)
Immature Granulocytes: 0 %
Lymphocytes Relative: 30 %
Lymphs Abs: 2.9 10*3/uL (ref 0.7–4.0)
MCH: 33.3 pg (ref 26.0–34.0)
MCHC: 33.8 g/dL (ref 30.0–36.0)
MCV: 98.3 fL (ref 80.0–100.0)
Monocytes Absolute: 1 10*3/uL (ref 0.1–1.0)
Monocytes Relative: 10 %
Neutro Abs: 5.8 10*3/uL (ref 1.7–7.7)
Neutrophils Relative %: 58 %
Platelets: 204 10*3/uL (ref 150–400)
RBC: 4.66 MIL/uL (ref 4.22–5.81)
RDW: 15.4 % (ref 11.5–15.5)
WBC: 9.9 10*3/uL (ref 4.0–10.5)
nRBC: 0 % (ref 0.0–0.2)

## 2020-03-14 LAB — URINALYSIS, ROUTINE W REFLEX MICROSCOPIC
Bacteria, UA: NONE SEEN
Bilirubin Urine: NEGATIVE
Glucose, UA: NEGATIVE mg/dL
Hgb urine dipstick: NEGATIVE
Ketones, ur: NEGATIVE mg/dL
Nitrite: NEGATIVE
Protein, ur: NEGATIVE mg/dL
Specific Gravity, Urine: 1.012 (ref 1.005–1.030)
pH: 6 (ref 5.0–8.0)

## 2020-03-14 LAB — COMPREHENSIVE METABOLIC PANEL
ALT: 33 U/L (ref 0–44)
AST: 26 U/L (ref 15–41)
Albumin: 3.8 g/dL (ref 3.5–5.0)
Alkaline Phosphatase: 43 U/L (ref 38–126)
Anion gap: 10 (ref 5–15)
BUN: 15 mg/dL (ref 6–20)
CO2: 25 mmol/L (ref 22–32)
Calcium: 8.8 mg/dL — ABNORMAL LOW (ref 8.9–10.3)
Chloride: 98 mmol/L (ref 98–111)
Creatinine, Ser: 0.9 mg/dL (ref 0.61–1.24)
GFR calc Af Amer: >60 mL/min (ref >60)
GFR calc non Af Amer: >60 mL/min (ref >60)
Glucose, Bld: 98 mg/dL (ref 70–99)
Potassium: 4.3 mmol/L (ref 3.5–5.1)
Sodium: 133 mmol/L — ABNORMAL LOW (ref 135–145)
Total Bilirubin: 0.9 mg/dL (ref 0.3–1.2)
Total Protein: 7.3 g/dL (ref 6.5–8.1)

## 2020-03-14 LAB — SEDIMENTATION RATE: Sed Rate: 4 mm/hr (ref 0–16)

## 2020-03-14 LAB — LIPASE, BLOOD: Lipase: 35 U/L (ref 11–51)

## 2020-03-14 LAB — C-REACTIVE PROTEIN: CRP: <0.5 mg/dL (ref <1.0)

## 2020-03-14 LAB — SARS CORONAVIRUS 2 BY RT PCR (HOSPITAL ORDER, PERFORMED IN ~~LOC~~ HOSPITAL LAB): SARS Coronavirus 2: NEGATIVE

## 2020-03-14 MED ORDER — METHYLPREDNISOLONE SODIUM SUCC 125 MG IJ SOLR
60.0000 mg | Freq: Once | INTRAMUSCULAR | Status: AC
  Administered 2020-03-14: 60 mg via INTRAVENOUS
  Filled 2020-03-14: qty 2

## 2020-03-14 MED ORDER — ONDANSETRON HCL 4 MG/2ML IJ SOLN
4.0000 mg | Freq: Once | INTRAMUSCULAR | Status: AC
  Administered 2020-03-14: 4 mg via INTRAVENOUS
  Filled 2020-03-14: qty 2

## 2020-03-14 MED ORDER — IOHEXOL 300 MG/ML  SOLN
100.0000 mL | Freq: Once | INTRAMUSCULAR | Status: AC | PRN
  Administered 2020-03-14: 100 mL via INTRAVENOUS

## 2020-03-14 MED ORDER — FENTANYL CITRATE (PF) 100 MCG/2ML IJ SOLN
50.0000 ug | Freq: Once | INTRAMUSCULAR | Status: AC
  Administered 2020-03-14: 50 ug via INTRAVENOUS
  Filled 2020-03-14: qty 2

## 2020-03-14 MED ORDER — HYDROMORPHONE HCL 1 MG/ML IJ SOLN
1.0000 mg | Freq: Once | INTRAMUSCULAR | Status: AC
  Administered 2020-03-14: 1 mg via INTRAVENOUS
  Filled 2020-03-14: qty 1

## 2020-03-14 MED ORDER — SODIUM CHLORIDE 0.9 % IV BOLUS
1000.0000 mL | Freq: Once | INTRAVENOUS | Status: AC
  Administered 2020-03-14: 1000 mL via INTRAVENOUS

## 2020-03-14 NOTE — Discharge Instructions (Addendum)
Your evaluation in the emergency department today has been reassuring.  We recommend follow-up with your gastroenterologist.  Continue Phenergan for management of nausea.  Drink plenty of fluids to prevent dehydration.  Return for new or concerning symptoms.

## 2020-03-14 NOTE — ED Triage Notes (Signed)
Pt states RLQ abdominal pain and diarrhea since Monday. Pt has hx of chrons and thinks he is having a flare up.

## 2020-03-14 NOTE — ED Provider Notes (Signed)
[facility] DEPT Provider Note   CSN: [id number] Arrival date & time: 03/14/20  0145     History Chief Complaint  Patient presents with  . Abdominal Pain    [name] [name] is a 44 y.o. male.  44 year old male with a history of anxiety, Crohn's disease (on Humira, Entocort), hypertension, dyslipidemia presents to the emergency department for evaluation of abdominal pain. Pain is constant and worse in the upper abdomen and right lower quadrant. States that he usually has pain in his right lower quadrant with associated Crohn's flares. Has tried home pain medication without relief. Reports persistent nausea with multiple episodes of vomiting. Has experienced emesis when he has tried to eat or drink anything today. Had one bowel movement today which was "foamy". He has not noticed any hematochezia. Usually has dark stool at baseline from his iron supplements. Further reporting subjective fever. No urinary symptoms.   GI - Dr. Carlean Purl PCP - Dr. Cathlean Cower  The history is provided by the patient. No language interpreter was used.  Abdominal Pain      Past Medical History:  Diagnosis Date  . Anal fissure and fistula    chronic  . Anxiety   . 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 Crohn's disease [facility]) followed by dr Carlean Purl   small and large intestines--  hx bowel resection 02/ 2009--  currently treated w/ humira  . Depression   . Essential hypertension, benign   . History of CMV 10/14/2010   resolved w/ antiviral therapy  . History of kidney stones   . History of resection of small bowel   . Hyperlipidemia   . OSA (obstructive sleep apnea)    01-05-2018 per pt resolved since tonsils removed   . Vitamin D deficiency     Patient Active Problem List   Diagnosis Date Noted  . Iron deficiency 12/28/2019  . Low normal vitamin B12 level 12/28/2019  . Dyspnea 10/16/2019  . Encounter for screening for HIV 07/07/2018  .  Hyperglycemia 07/07/2018  . Bronchitis 06/29/2018  . 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 Perianal dermatitis 10/26/2017  . Encounter for well adult exam with abnormal findings 05/10/2017  . High risk sexual behavior 06/30/2016  . Folliculitis 16/57/9038  . Ganglion cyst of flexor tendon sheath 11/02/2014  . Chronic posterior anal fissure 02/15/2014  . Tinea pedis 06/27/2013  . Long-term use of immunosuppressant medication - Humira 10/14/2010  . Obesity, Class II, BMI 35-39.9 07/20/2007  . Anxiety with depression 07/20/2007  . Insomnia 07/20/2007  . OSA (obstructive sleep apnea) 07/20/2007  . Essential hypertension, benign 07/20/2007  . CROHN'S DISEASE, LARGE AND SMALL INTESTINES 07/20/2007    Past Surgical History:  Procedure Laterality Date  . ANAL FISTULOTOMY N/A 01/07/2018   Procedure: ANAL FISTULOTOMY;  Surgeon: Leighton Ruff, MD;  Location: [facility];  Service: General;  Laterality: N/A;  . COLONOSCOPY  last one 04-2017   dr Carlean Purl  . EXPLORATORY LAPAROTOMY W/ ILEOSECECTOMY W/ PRIMARY ANASTAMOSIS  08-05-2007    dr gross  [facility]   crohn's, sbo, perferation's  . GANGLION CYST EXCISION Right 2016   wrist  . HYPOSPADIAS CORRECTION  CHILD  . SPHINCTEROTOMY N/A 01/07/2018   Procedure: CHEMICAL SPHINCTEROTOMY (BOTOX);  Surgeon: Leighton Ruff, MD;  Location: [facility];  Service: General;  Laterality: N/A;  . TONSILLECTOMY Bilateral 09/30/2015   Procedure: BILATERAL TONSILLECTOMY;  Surgeon: Izora Gala, MD;  Location: [facility];  Service: ENT;  Laterality: Bilateral;  . UPPER GASTROINTESTINAL ENDOSCOPY  10/15/2010   w/biopsy, mild gastritis and duodenitis       Family History  Problem Relation Age of Onset  . Heart disease Father        CAD/MI  . Hyperlipidemia Father   . Hypertension Father   . Gout Father   . Sleep apnea Father   . Obesity Mother   . Allergies Mother   . Cancer Mother   . Stroke Maternal  Grandfather   . Allergies Sister   . Asthma Sister   . Colon cancer Neg Hx   . Diabetes Neg Hx   . COPD Neg Hx   . Colon polyps Neg Hx     Social History   Tobacco Use  . Smoking status: Never Smoker  . Smokeless tobacco: Never Used  Vaping Use  . Vaping Use: Never used  Substance Use Topics  . Alcohol use: Yes    Alcohol/week: 3.0 standard drinks    Types: 3 Shots of liquor per week  . Drug use: No    Home Medications Prior to Admission medications   Medication Sig Start Date End Date Taking? Authorizing Provider  albuterol (VENTOLIN HFA) 108 (90 Base) MCG/ACT inhaler Inhale 2 puffs into the lungs every 6 (six) hours as needed for wheezing or shortness of breath. 10/16/19  Yes Biagio Borg, MD  ALPRAZolam (XANAX XR) 3 MG 24 hr tablet TAKE 1 TABLET (3 MG TOTAL) BY MOUTH EVERY MORNING. 01/29/20  Yes Biagio Borg, MD  alprazolam Duanne Moron) 2 MG tablet TAKE 1/2 TAB BY MOUTH TWICE DAILY AS NEEDED Patient taking differently: Take 1 mg by mouth 2 (two) times daily as needed for sleep or anxiety.  01/29/20  Yes Biagio Borg, MD  budesonide (ENTOCORT EC) 3 MG 24 hr capsule TAKE 3 CAPSULES (9 MG TOTAL) BY MOUTH DAILY. 03/07/20  Yes Gatha Mayer, MD  cyanocobalamin (,VITAMIN B-12,) 1000 MCG/ML injection Inject 1 mL (1,000 mcg total) into the muscle every 30 (thirty) days. 12/28/19  Yes Gatha Mayer, MD  diphenoxylate-atropine (LOMOTIL) 2.5-0.025 MG tablet TAKE 1 TABLET BY MOUTH FOUR TIMES A DAY AS NEEDED FOR DIARRHEA / LOOSE STOOLS Patient taking differently: Take 1 tablet by mouth daily.  08/14/19  Yes Gatha Mayer, MD  DULoxetine (CYMBALTA) 60 MG capsule TAKE 1 CAPSULE (60 MG TOTAL) BY MOUTH 2 (TWO) TIMES DAILY. Patient taking differently: Take 120 mg by mouth daily.  05/26/19  Yes Biagio Borg, MD  emtricitabine-tenofovir AF (DESCOVY) 200-25 MG tablet Take 1 tablet by mouth daily. 11/30/19  Yes Kuppelweiser, Cassie L, RPH-CPP  ferrous sulfate 325 (65 FE) MG EC tablet Take 325 mg by mouth  daily with breakfast.   Yes [provider]  HUMIRA PEN 40 MG/0.4ML PNKT INJECT 40 MG INTO THE SKIN ONCE A WEEK Patient taking differently: Inject 40 mg into the skin every Wednesday.  12/14/18  Yes Gatha Mayer, MD  hyoscyamine (LEVBID) 0.375 MG 12 hr tablet Take 1 tablet (0.375 mg total) by mouth 2 (two) times daily. 03/01/20  Yes Gatha Mayer, MD  lisinopril-hydrochlorothiazide (ZESTORETIC) 20-25 MG tablet TAKE 1 TABLET BY MOUTH EVERY DAY Patient taking differently: Take 1 tablet by mouth daily.  05/26/19  Yes Biagio Borg, MD  ondansetron (ZOFRAN) 4 MG tablet TAKE 1 TAB BY MOUTH EVERY 6 HOURS Patient taking differently: Take 4 mg by mouth every 6 (six) hours as needed for nausea or vomiting.  11/17/18  Yes Gatha Mayer, MD  Vitamin D, Ergocalciferol, (DRISDOL) 1.25 MG (50000 UNIT) CAPS capsule Take 1 capsule (50,000 Units total) by mouth every 7 (seven) days. Patient taking differently: Take 50,000 Units by mouth every Saturday.  12/02/19  Yes Biagio Borg, MD  zolpidem (AMBIEN) 10 MG tablet Take 1 tablet (10 mg total) by mouth at bedtime as needed. for sleep 12/29/19  Yes Biagio Borg, MD  acetaminophen (TYLENOL) 325 MG tablet Take 650 mg by mouth every 6 (six) hours as needed for moderate pain.    [provider]  oxyCODONE-acetaminophen (PERCOCET) 10-325 MG tablet Take 1 tablet by mouth every 6 (six) hours as needed for pain. Patient not taking: Reported on 03/14/2020 03/01/20   Gatha Mayer, MD  predniSONE (DELTASONE) 10 MG tablet Take 40 mg a day for 3 days.  Taper as directed Patient not taking: Reported on 03/14/2020 01/23/20   Raylene Everts, MD    Allergies    Patient has no known allergies.  Review of Systems   Review of Systems  Gastrointestinal: Positive for abdominal pain.  Ten systems reviewed and are negative for acute change, except as noted in the HPI.    Physical Exam Updated Vital Signs BP 117/68   Pulse (!) 58   Temp 97.9 F (36.6 C)  (Oral)   Resp 18   Ht 5' 9"  (1.753 m)   Wt 102.1 kg   SpO2 97%   BMI 33.23 kg/m   Physical Exam Vitals and nursing note reviewed.  Constitutional:      General: He is not in acute distress.    Appearance: He is well-developed. He is not diaphoretic.     Comments: Nontoxic-appearing and in no distress  HENT:     Head: Normocephalic and atraumatic.  Eyes:     General: No scleral icterus.    Conjunctiva/sclera: Conjunctivae normal.  Pulmonary:     Effort: Pulmonary effort is normal. No respiratory distress.     Comments: Respirations even and unlabored Abdominal:     Comments: Well-healed surgical incision site from prior ex lap with ileocecectomy. Tenderness to palpation worse in the epigastrium, right upper quadrant, right lower quadrant. Abdomen soft, obese. No peritoneal signs.  Musculoskeletal:        General: Normal range of motion.     Cervical back: Normal range of motion.  Skin:    General: Skin is warm and dry.     Coloration: Skin is not pale.     Findings: No erythema or rash.  Neurological:     Mental Status: He is alert and oriented to person, place, and time.     Coordination: Coordination normal.  Psychiatric:        Behavior: Behavior normal.     ED Results / Procedures / Treatments   Labs (all labs ordered are listed, but only abnormal results are displayed) Labs Reviewed  COMPREHENSIVE METABOLIC PANEL - Abnormal; Notable for the following components:      Result Value   Sodium 133 (*)    Calcium 8.8 (*)    All other components within normal limits  URINALYSIS, ROUTINE W REFLEX MICROSCOPIC - Abnormal; Notable for the following components:   Leukocytes,Ua TRACE (*)    All other components within normal limits  SARS CORONAVIRUS 2 BY RT PCR (HOSPITAL ORDER, [facility])  CBC WITH DIFFERENTIAL/PLATELET  SEDIMENTATION RATE  C-REACTIVE PROTEIN  LIPASE, BLOOD    EKG None  Radiology CT ABDOMEN  PELVIS W CONTRAST  Result  Date: 03/14/2020 CLINICAL DATA:  Right lower quadrant abdominal pain, diarrhea, Crohn's disease EXAM: CT ABDOMEN AND PELVIS WITH CONTRAST TECHNIQUE: Multidetector CT imaging of the abdomen and pelvis was performed using the standard protocol following bolus administration of intravenous contrast. CONTRAST:  137m OMNIPAQUE IOHEXOL 300 MG/ML  SOLN COMPARISON:  02/13/2020 FINDINGS: Lower chest: The visualized lung bases are clear bilaterally. Visualized heart and pericardium are unremarkable. Hepatobiliary: No focal liver abnormality is seen. No gallstones, gallbladder wall thickening, or biliary dilatation. Pancreas: Unremarkable Spleen: Unremarkable Adrenals/Urinary Tract: The adrenal glands are unremarkable. The kidneys are normal in size and position. Tiny exophytic cortical cyst arises from the interpolar region of the left kidney. The kidneys are otherwise unremarkable. The bladder is unremarkable. Stomach/Bowel: Surgical changes of ileocecectomy are identified. The residual large and small bowel are unremarkable without evidence of obstruction or focal inflammation. Stomach unremarkable. No free intraperitoneal gas or fluid. Vascular/Lymphatic: A few shotty lymph nodes are again seen within the ileocolic lymph node group, unchanged from prior examination. No frankly pathologic adenopathy is seen within the abdomen and pelvis. The abdominal vasculature is age-appropriate with mild aortic atherosclerotic calcification. Reproductive: Prostate is unremarkable. Other: Complex ventral hernia is seen with at least 2 hernia sacs containing mesenteric fat, unchanged from prior examination. Rectum unremarkable. Musculoskeletal: No acute bone abnormality. IMPRESSION: No definite radiographic explanation for the patient's reported right lower quadrant abdominal pain. Surgical changes of ileocecectomy in keeping with given history of Crohn's disease. No active inflammatory change or obstruction identified. Electronically  Signed   By: AFidela SalisburyMD   On: 03/14/2020 03:59    Procedures Procedures (including critical care time)  Medications Ordered in ED Medications  methylPREDNISolone sodium succinate (SOLU-MEDROL) 125 mg/2 mL injection 60 mg (60 mg Intravenous Given 03/14/20 0255)  fentaNYL (SUBLIMAZE) injection 50 mcg (50 mcg Intravenous Given 03/14/20 0255)  ondansetron (ZOFRAN) injection 4 mg (4 mg Intravenous Given 03/14/20 0256)  sodium chloride 0.9 % bolus 1,000 mL (0 mLs Intravenous Stopped 03/14/20 0552)  iohexol (OMNIPAQUE) 300 MG/ML solution 100 mL (100 mLs Intravenous Contrast Given 03/14/20 0341)  HYDROmorphone (DILAUDID) injection 1 mg (1 mg Intravenous Given 03/14/20 0500)    ED Course  I have reviewed the triage vital signs and the nursing notes.  Pertinent labs & imaging results that were available during my care of the patient were reviewed by me and considered in my medical decision making (see chart for details).  Clinical Course as of Mar 14 600  Thu Mar 14, 2020  0450 Labs reassuring. Normal WBC count. No elevated inflammatory markers or electrolyte derangements. COVID negative.   CT shows no radiographic changes to explain patient's symptoms. Specifically, No active inflammatory change or obstruction identified.  Findings reviewed with patient who verbalizes understanding. He will try to provide stool sample.   [KH]  0600 Unable to provide stool sample after multiple hours of observation in the ED.  Low suspicion for C. difficile given infrequent stooling, lack of colonic changes on imaging.  Explained to patient that these tests can be repeated as an outpatient if indicated.   [KH]    Clinical Course User Index [KH] HAntonietta Breach [name]   MDM Rules/Calculators/A&P                          44year old male with a history of Crohn's disease presents to the emergency department for complaints of abdominal pain with vomiting, one episode of  diarrhea today.  His abdominal pain has  been waxing and waning over the past few days.  Patient is afebrile with reassuring vital signs.  No criteria for SIRS/sepsis and he does not have a leukocytosis.  No electrolyte derangement.  Liver and kidney function preserved.  There is initial concern for flare of Crohn's disease, but inflammatory markers are reassuring and CT scan does not show any evidence of acute abdominal pathology.  Patient has had symptomatic improvement with IV pain medication and antiemetics.  I have encouraged that he follow-up with his gastroenterology office later today, if possible.  Otherwise able to follow-up with his PCP.  Return precautions discussed and provided. Patient discharged in stable condition with no unaddressed concerns.   Final Clinical Impression(s) / ED Diagnoses Final diagnoses:  Generalized abdominal pain    Rx / DC Orders ED Discharge Orders    None       Antonietta Breach, [name] 03/14/20 0601    Molpus, Jenny Reichmann, MD 03/14/20 [phone]

## 2020-03-15 ENCOUNTER — Ambulatory Visit: Payer: BC Managed Care – PPO | Admitting: Physician Assistant

## 2020-03-15 ENCOUNTER — Encounter: Payer: Self-pay | Admitting: Physician Assistant

## 2020-03-15 VITALS — BP 124/82 | HR 78

## 2020-03-15 DIAGNOSIS — K50818 Crohn's disease of both small and large intestine with other complication: Secondary | ICD-10-CM

## 2020-03-15 DIAGNOSIS — R112 Nausea with vomiting, unspecified: Secondary | ICD-10-CM | POA: Diagnosis not present

## 2020-03-15 MED ORDER — OXYCODONE-ACETAMINOPHEN 10-325 MG PO TABS
1.0000 | ORAL_TABLET | Freq: Four times a day (QID) | ORAL | 0 refills | Status: DC | PRN
Start: 2020-03-15 — End: 2020-04-02

## 2020-03-15 NOTE — Patient Instructions (Addendum)
If you are age 45 or older, your body mass index should be between 23-30. Your There is no height or weight on file to calculate BMI. If this is out of the aforementioned range listed, please consider follow up with your Primary Care Provider.  If you are age 59 or younger, your body mass index should be between 19-25. Your There is no height or weight on file to calculate BMI. If this is out of the aformentioned range listed, please consider follow up with your Primary Care Provider.   Refilled Percocet.   Will call you next week once FMLA forms are completed.  Follow up with Dr. Carlean Purl in 1-2 months.

## 2020-03-15 NOTE — Progress Notes (Signed)
Chief Complaint: Follow-up ED visit for right lower quadrant pain  HPI:    [name] [name] is a 44 year old male with a past medical history as listed below including Crohn's disease (on Humira and Entocort), known to Dr. Carlean Purl, who presents clinic today for follow-up after his ED visit for generalized abdominal pain yesterday.    01/23/2020 patient seen in the urgent care for a flare of his Crohn's.  He was given prednisone and Percocet.    03/14/2020 patient seen in the ED for abdominal pain.  Described constant/worse in the upper abdomen and right lower quadrant.  Also nausea with multiple episodes of vomiting.  CMP with a sodium minimally decreased at 133, urinalysis with trace leukocytes, Covid negative, CBC, CRP, ESR and lipase normal.  CT abdomen pelvis with contrast showed no definite radiographic explanation with the patient's reported right lower quadrant abdominal pain, surgical changes of ileocecectomy in keeping with given history of Crohn's disease, no active inflammatory change or obstruction.  Patient had symptomatic improvement with IV pain meds and antiemetics.    Today, the patient tells me that he had actually not picked up his Hyoscyamine as prescribed by Dr. Carlean Purl prior to his beach trip about a week ago and he finally picked it up yesterday and has taken 2 tablets, he feels like this is helping with his abdominal pain, in fact symptoms are about 70 to 80% better.  He tells me that really he thinks he had issues after smoking a little marijuana over the the weekend last weekend at the beach with his friends, he came home on Monday and started with vomiting and diarrhea all day as well as Tuesday, he proceeded to the ER as above and work-up was pretty much negative.  Since then symptoms have gotten much better.  He is still having loose stool but it is only 1-2 times a day.  No further nausea or vomiting.  Overall feels much better but does continue with some abdominal pain.  Does request  a few more pain pills just in case the Hyoscyamine does not work over the weekend.    Denies fever, chills or blood in the stool.  Past Medical History:  Diagnosis Date  . Anal fissure and fistula    chronic  . Anxiety   . 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 Crohn's disease [facility]) followed by dr Carlean Purl   small and large intestines--  hx bowel resection 02/ 2009--  currently treated w/ humira  . Depression   . Essential hypertension, benign   . History of CMV 10/14/2010   resolved w/ antiviral therapy  . History of kidney stones   . History of resection of small bowel   . Hyperlipidemia   . OSA (obstructive sleep apnea)    01-05-2018 per pt resolved since tonsils removed   . Vitamin D deficiency     Past Surgical History:  Procedure Laterality Date  . ANAL FISTULOTOMY N/A 01/07/2018   Procedure: ANAL FISTULOTOMY;  Surgeon: Leighton Ruff, MD;  Location: [facility];  Service: General;  Laterality: N/A;  . COLONOSCOPY  last one 04-2017   dr Carlean Purl  . EXPLORATORY LAPAROTOMY W/ ILEOSECECTOMY W/ PRIMARY ANASTAMOSIS  08-05-2007    dr gross  [facility]   crohn's, sbo, perferation's  . GANGLION CYST EXCISION Right 2016   wrist  . HYPOSPADIAS CORRECTION  CHILD  . SPHINCTEROTOMY N/A 01/07/2018   Procedure: CHEMICAL SPHINCTEROTOMY (BOTOX);  Surgeon: Leighton Ruff, MD;  Location: [place];  Service: General;  Laterality: N/A;  . TONSILLECTOMY Bilateral 09/30/2015   Procedure: BILATERAL TONSILLECTOMY;  Surgeon: Izora Gala, MD;  Location: [place];  Service: ENT;  Laterality: Bilateral;  . UPPER GASTROINTESTINAL ENDOSCOPY  10/15/2010   w/biopsy, mild gastritis and duodenitis    Current Outpatient Medications  Medication Sig Dispense Refill  . acetaminophen (TYLENOL) 325 MG tablet Take 650 mg by mouth every 6 (six) hours as needed for moderate pain.    Marland Kitchen ALPRAZolam (XANAX XR) 3 MG 24 hr tablet TAKE 1 TABLET (3 MG TOTAL) BY MOUTH  EVERY MORNING. 30 tablet 5  . alprazolam (XANAX) 2 MG tablet TAKE 1/2 TAB BY MOUTH TWICE DAILY AS NEEDED (Patient taking differently: Take 1 mg by mouth 2 (two) times daily as needed for sleep or anxiety. ) 30 tablet 5  . budesonide (ENTOCORT EC) 3 MG 24 hr capsule TAKE 3 CAPSULES (9 MG TOTAL) BY MOUTH DAILY. 90 capsule 1  . cyanocobalamin (,VITAMIN B-12,) 1000 MCG/ML injection Inject 1 mL (1,000 mcg total) into the muscle every 30 (thirty) days. 3 mL 3  . diphenoxylate-atropine (LOMOTIL) 2.5-0.025 MG tablet TAKE 1 TABLET BY MOUTH FOUR TIMES A DAY AS NEEDED FOR DIARRHEA / LOOSE STOOLS (Patient taking differently: Take 1 tablet by mouth daily. ) 90 tablet 1  . DULoxetine (CYMBALTA) 60 MG capsule TAKE 1 CAPSULE (60 MG TOTAL) BY MOUTH 2 (TWO) TIMES DAILY. (Patient taking differently: Take 120 mg by mouth daily. ) 180 capsule 3  . emtricitabine-tenofovir AF (DESCOVY) 200-25 MG tablet Take 1 tablet by mouth daily. 30 tablet 5  . ferrous sulfate 325 (65 FE) MG EC tablet Take 325 mg by mouth daily with breakfast.    . HUMIRA PEN 40 MG/0.4ML PNKT INJECT 40 MG INTO THE SKIN ONCE A WEEK (Patient taking differently: Inject 40 mg into the skin every Wednesday. ) 12 each 3  . hyoscyamine (LEVBID) 0.375 MG 12 hr tablet Take 1 tablet (0.375 mg total) by mouth 2 (two) times daily. 60 tablet 0  . lisinopril-hydrochlorothiazide (ZESTORETIC) 20-25 MG tablet TAKE 1 TABLET BY MOUTH EVERY DAY (Patient taking differently: Take 1 tablet by mouth daily. ) 90 tablet 3  . ondansetron (ZOFRAN) 4 MG tablet TAKE 1 TAB BY MOUTH EVERY 6 HOURS (Patient taking differently: Take 4 mg by mouth every 6 (six) hours as needed for nausea or vomiting. ) 30 tablet 1  . oxyCODONE-acetaminophen (PERCOCET) 10-325 MG tablet Take 1 tablet by mouth every 6 (six) hours as needed for pain. 30 tablet 0  . predniSONE (DELTASONE) 10 MG tablet Take 40 mg a day for 3 days.  Taper as directed 40 tablet 0  . Vitamin D, Ergocalciferol, (DRISDOL) 1.25 MG  (50000 UNIT) CAPS capsule Take 1 capsule (50,000 Units total) by mouth every 7 (seven) days. (Patient taking differently: Take 50,000 Units by mouth every Saturday. ) 12 capsule 0  . zolpidem (AMBIEN) 10 MG tablet Take 1 tablet (10 mg total) by mouth at bedtime as needed. for sleep 30 tablet 5   Current Facility-Administered Medications  Medication Dose Route Frequency Provider Last Rate Last Admin  . cyanocobalamin ((VITAMIN B-12)) injection 1,000 mcg  1,000 mcg Intramuscular Q30 days Gatha Mayer, MD   1,000 mcg at 02/05/20 4174    Allergies as of 03/15/2020  . (No Known Allergies)    Family History  Problem Relation Age of Onset  . Heart disease Father        CAD/MI  .  Hyperlipidemia Father   . Hypertension Father   . Gout Father   . Sleep apnea Father   . Obesity Mother   . Allergies Mother   . Cancer Mother   . Stroke Maternal Grandfather   . Allergies Sister   . Asthma Sister   . Colon cancer Neg Hx   . Diabetes Neg Hx   . COPD Neg Hx   . Colon polyps Neg Hx   . Stomach cancer Neg Hx   . Pancreatic cancer Neg Hx     Social History   Socioeconomic History  . Marital status: Divorced    Spouse name: Not on file  . Number of children: 2  . Years of education: 21  . Highest education level: Not on file  Occupational History  . Occupation: Librarian, academic: [employer]  Tobacco Use  . Smoking status: Never Smoker  . Smokeless tobacco: Never Used  Vaping Use  . Vaping Use: Never used  Substance and Sexual Activity  . Alcohol use: Yes    Alcohol/week: 3.0 standard drinks    Types: 3 Shots of liquor per week  . Drug use: No  . Sexual activity: Yes    Partners: Male    Comment: [place] protection  Other Topics Concern  . Not on file  Social History Narrative   HSG, [employer] - 2 years. Married '99 - 6yr/divorced. 2 sons - '98, '00 split custody. 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 No history of physical or sexual abuse.      [place]  Social Determinants of Health   Financial Resource Strain:   . Difficulty of Paying Living Expenses: Not on file  Food Insecurity:   . Worried About RCharity fundraiserin the Last Year: Not on file  . Ran Out of Food in the Last Year: Not on file  Transportation Needs:   . Lack of Transportation (Medical): Not on file  . Lack of Transportation (Non-Medical): Not on file  Physical Activity:   . Days of Exercise per Week: Not on file  . Minutes of Exercise per Session: Not on file  Stress:   . Feeling of Stress : Not on file  Social Connections:   . Frequency of Communication with Friends and Family: Not on file  . Frequency of Social Gatherings with Friends and Family: Not on file  . Attends Religious Services: Not on file  . Active Member of Clubs or Organizations: Not on file  . Attends CArchivistMeetings: Not on file  . Marital Status: Not on file  Intimate Partner Violence:   . Fear of Current or Ex-Partner: Not on file  . Emotionally Abused: Not on file  . Physically Abused: Not on file  . Sexually Abused: Not on file    Review of Systems:    Constitutional: No weight loss, fever or chills Cardiovascular: No chest pain Respiratory: No SOB  Gastrointestinal: See HPI and otherwise negative   Physical Exam:  Vital signs: BP 124/82   Pulse 78   Constitutional:   Pleasant Caucasian male appears to be in NAD, Well developed, Well nourished, alert and cooperative Respiratory: Respirations even and unlabored. Lungs clear to auscultation bilaterally.   No wheezes, crackles, or rhonchi.  Cardiovascular: Normal S1, S2. No MRG. Regular rate and rhythm. No peripheral edema, cyanosis or pallor.  Gastrointestinal:  Soft, nondistended, nontender. No rebound or guarding. Normal bowel sounds. No appreciable masses  or hepatomegaly. Rectal:  Not performed.  Psychiatric:  Demonstrates good judgement and reason without abnormal affect or  behaviors.  RELEVANT LABS AND IMAGING: CBC    Component Value Date/Time   WBC 9.9 03/14/2020 0217   RBC 4.66 03/14/2020 0217   HGB 15.5 03/14/2020 0217   HCT 45.8 03/14/2020 0217   PLT 204 03/14/2020 0217   MCV 98.3 03/14/2020 0217   MCH 33.3 03/14/2020 0217   MCHC 33.8 03/14/2020 0217   RDW 15.4 03/14/2020 0217   LYMPHSABS 2.9 03/14/2020 0217   MONOABS 1.0 03/14/2020 0217   EOSABS 0.1 03/14/2020 0217   BASOSABS 0.1 03/14/2020 0217    CMP     Component Value Date/Time   NA 133 (L) 03/14/2020 0217   K 4.3 03/14/2020 0217   CL 98 03/14/2020 0217   CO2 25 03/14/2020 0217   GLUCOSE 98 03/14/2020 0217   BUN 15 03/14/2020 0217   CREATININE 0.90 03/14/2020 0217   CREATININE 0.98 11/29/2019 1423   CALCIUM 8.8 (L) 03/14/2020 0217   PROT 7.3 03/14/2020 0217   ALBUMIN 3.8 03/14/2020 0217   AST 26 03/14/2020 0217   ALT 33 03/14/2020 0217   ALKPHOS 43 03/14/2020 0217   BILITOT 0.9 03/14/2020 0217   GFRNONAA >60 03/14/2020 0217   GFRAA >60 03/14/2020 0217    Assessment: 1.  Crohn's with complication: Patient did have acute diarrhea and still persists with 3 bowel movements a day with some abdominal pain, evaluation in the ER was pretty much negative for a Crohn's flare including negative CT, ESR and CRP, symptoms have improved over the past 24 hours the use of hyoscyamine; consider IBS versus viral gastroenteritis versus Crohn's 2.  Nausea and vomiting: Better over the last 24 hours  Plan: 1.  Patient is requesting refill of his Percocet.  Refilled Percocet just #10 with no further refills.  Explained that hopefully the Hyoscyamine will help with his abdominal cramping and loose stools. 2.  Discussed Hyoscyamine usage.  He can use this every 6 hours as needed.  Tells me that he just received 60 pills so he does not need a refill. 3.  Patient did bring with him paperwork to fill out for FMLA.  Told him Dr. Carlean Purl will be back in the office on Tuesday and hopefully can get these  filled out for him. 4.  Patient to follow in clinic with Dr. Arelia Longest in 2 months or sooner if necessary.  Ellouise Newer, [name] [facility] 03/15/2020, 3:43 PM  Cc: Biagio Borg, MD

## 2020-03-19 ENCOUNTER — Ambulatory Visit: Payer: BC Managed Care – PPO | Admitting: Psychologist

## 2020-03-20 DIAGNOSIS — Z0279 Encounter for issue of other medical certificate: Secondary | ICD-10-CM

## 2020-03-26 ENCOUNTER — Telehealth: Payer: Self-pay | Admitting: Internal Medicine

## 2020-03-28 ENCOUNTER — Other Ambulatory Visit: Payer: Self-pay | Admitting: Internal Medicine

## 2020-03-28 NOTE — Telephone Encounter (Signed)
Please advise Sir, thank you. 

## 2020-03-28 NOTE — Telephone Encounter (Signed)
Please change to OTC Vitamin D3 at 2000 units per day, indefinitely.  

## 2020-03-29 ENCOUNTER — Ambulatory Visit (INDEPENDENT_AMBULATORY_CARE_PROVIDER_SITE_OTHER): Payer: BC Managed Care – PPO | Admitting: Psychologist

## 2020-03-29 DIAGNOSIS — F411 Generalized anxiety disorder: Secondary | ICD-10-CM

## 2020-03-29 DIAGNOSIS — F330 Major depressive disorder, recurrent, mild: Secondary | ICD-10-CM | POA: Diagnosis not present

## 2020-04-02 ENCOUNTER — Encounter: Payer: Self-pay | Admitting: Internal Medicine

## 2020-04-02 ENCOUNTER — Other Ambulatory Visit (INDEPENDENT_AMBULATORY_CARE_PROVIDER_SITE_OTHER): Payer: BC Managed Care – PPO

## 2020-04-02 ENCOUNTER — Ambulatory Visit (INDEPENDENT_AMBULATORY_CARE_PROVIDER_SITE_OTHER): Payer: BC Managed Care – PPO | Admitting: Internal Medicine

## 2020-04-02 ENCOUNTER — Other Ambulatory Visit: Payer: BC Managed Care – PPO

## 2020-04-02 VITALS — BP 114/78 | HR 96 | Ht 67.25 in | Wt 219.4 lb

## 2020-04-02 DIAGNOSIS — E611 Iron deficiency: Secondary | ICD-10-CM | POA: Diagnosis not present

## 2020-04-02 DIAGNOSIS — Z79899 Other long term (current) drug therapy: Secondary | ICD-10-CM | POA: Diagnosis not present

## 2020-04-02 DIAGNOSIS — E538 Deficiency of other specified B group vitamins: Secondary | ICD-10-CM

## 2020-04-02 DIAGNOSIS — K50819 Crohn's disease of both small and large intestine with unspecified complications: Secondary | ICD-10-CM

## 2020-04-02 DIAGNOSIS — E559 Vitamin D deficiency, unspecified: Secondary | ICD-10-CM

## 2020-04-02 DIAGNOSIS — Z7960 Long term (current) use of unspecified immunomodulators and immunosuppressants: Secondary | ICD-10-CM

## 2020-04-02 DIAGNOSIS — F439 Reaction to severe stress, unspecified: Secondary | ICD-10-CM

## 2020-04-02 DIAGNOSIS — L309 Dermatitis, unspecified: Secondary | ICD-10-CM

## 2020-04-02 DIAGNOSIS — K439 Ventral hernia without obstruction or gangrene: Secondary | ICD-10-CM | POA: Insufficient documentation

## 2020-04-02 LAB — FERRITIN: Ferritin: 31.8 ng/mL (ref 22.0–322.0)

## 2020-04-02 MED ORDER — OXYCODONE-ACETAMINOPHEN 10-325 MG PO TABS: 1.0000 | ORAL_TABLET | Freq: Four times a day (QID) | ORAL | 0 refills | Status: DC | PRN

## 2020-04-02 MED ORDER — CYANOCOBALAMIN 1000 MCG/ML IJ SOLN
1000.0000 ug | Freq: Once | INTRAMUSCULAR | Status: AC
  Administered 2020-04-02: 1000 ug via INTRAMUSCULAR

## 2020-04-02 MED ORDER — NYSTATIN-TRIAMCINOLONE 100000-0.1 UNIT/GM-% EX OINT: 1.0000 "application " | TOPICAL_OINTMENT | Freq: Two times a day (BID) | CUTANEOUS | 1 refills | Status: DC

## 2020-04-02 NOTE — Assessment & Plan Note (Addendum)
He had a flare while on weekly Humira to the best of our knowledge.  Radiographic inflammatory changes though less specific on the regular CT abdomen and pelvis were gone a month after and only about 2 weeks after taking budesonide.  We talked about how this could mean he is failing Humira.  Our plan is to continue budesonide for 2 months.  Reassess after that.  Office visit early December. He is not inclined to change therapy at this time.  He had adequate levels he does not have antibiotics.  This is not unreasonable in my opinion.  He continues to have pain and Lomotil, dicyclomine were ineffective.  Extended release hyoscyamine caused headaches so we will discontinue. 10 mg Percocet Rx today.  #30.  Explained do not intend for this to be chronic.

## 2020-04-02 NOTE — Progress Notes (Addendum)
[name] 44 y.o. 1975-07-10 [id number]  Assessment & Plan:   Encounter Diagnoses  Name Primary?  . Crohn's disease of small and large intestines with complication ([place]) Yes  . Iron deficiency   . Long-term use of immunosuppressant medication - Humira   . Perianal dermatitis   . Situational stress   . Vitamin D deficiency     CROHN'S DISEASE, LARGE AND SMALL INTESTINES He had a flare while on weekly Humira to the best of our knowledge.  Radiographic inflammatory changes though less specific on the regular CT abdomen and pelvis were gone a month after and only about 2 weeks after taking budesonide.  We talked about how this could mean he is failing Humira.  Our plan is to continue budesonide for 2 months.  Reassess after that.  Office visit early December. He is not inclined to change therapy at this time.  He had adequate levels he does not have antibiotics.  This is not unreasonable in my opinion.  He continues to have pain and Lomotil, dicyclomine were ineffective.  Extended release hyoscyamine caused headaches so we will discontinue. 10 mg Percocet Rx today.  #30.  Explained do not intend for this to be chronic.  Long-term use of immunosuppressant medication - Humira No signs of toxicity, he does not have antibodies and his levels on weekly Humira were acceptable.  Perianal dermatitis This has recurred.  Mycolog ointment Rx.  Iron deficiency Lab Results  Component Value Date   FERRITIN 31.8 04/02/2020  Ferritin was checked today and is improved.  The stool changes with iron tablets are bothersome he can go to every other day.  He has not been anemic.    Vitamin D deficiency He is on a daily dose after taking an 8-week course of 50,000 units weekly.  Needs to be reassessed.  Neglected to do that today we will do at a follow-up visit.  Unless could be added to today's labs.  Meds ordered this encounter  Medications  . oxyCODONE-acetaminophen (PERCOCET) 10-325 MG  tablet    Sig: Take 1 tablet by mouth every 6 (six) hours as needed for pain.    Dispense:  30 tablet    Refill:  0  . nystatin-triamcinolone ointment (MYCOLOG)    Sig: Apply 1 application topically 2 (two) times daily.    Dispense:  30 g    Refill:  1   Medications Discontinued During This Encounter  Medication Reason  . predniSONE (DELTASONE) 10 MG tablet Completed Course  . oxyCODONE-acetaminophen (PERCOCET) 10-325 MG tablet Completed Course  . Vitamin D, Ergocalciferol, (DRISDOL) 1.25 MG (50000 UNIT) CAPS capsule Completed Course  . hyoscyamine (LEVBID) 0.375 MG 12 hr tablet     I appreciate the opportunity to care for this patient. CC: [name] Borg, MD  ADDENDUM:  Subsequent phone conversation with the patient on  April 12, 2020.  [name] [name] is still having pain and fatigue issues and in my medical opinion I support remaining on short-term disability and out of work through May 03, 2020.  Gatha Mayer, MD, FACG 1:28 PM 04/15/2020   Subjective:   Chief Complaint: Crohn's flare  HPI [name] [name] is a 44 year old white man with Crohn's ileocolitis status post resection who was in the middle of a flare being treated with budesonide while still on weekly Humira.  Previously on azathioprine with Remicade, azathioprine discontinued due to CMV infection and Remicade stopped due to lack of efficacy and antibodies developing in 2014.  Humira begun 2015.  Things started to deteriorate with abdominal pain and diarrhea in late August and a CT enterography demonstrated neoterminal ileum thickening.  He was started on budesonide after that, Humira level was acceptable and antibodies were not present.  He has had a slow response requiring narcotic pain medicine.  He went to the emergency department a month after his original CT where a repeat CT scan abdomen pelvis with contrast (not enterography) showed no significant inflammatory changes at the CT level.  It did show his complex ventral  hernia with at least 2 hernia sacs containing mesenteric fat unchanged.  He is not taking narcotic pain medicines now and was taking hyoscyamine 0.375 mg twice daily which was helpful but unfortunately caused terrible headaches.  I asked him to try taking a bit more Lomotil to see if that made a difference but it has not.  He is having about 3 loose stools a day which is an improvement in diarrhea.  The pain is better but still disruptive including sleep.  He also has slight perianal stool leakage which is not necessarily new but it's made a bit worse since taking iron for a low ferritin.  Also with some perianal itching and irritation.  He does use wet wipes and tries to use a hair dryer on low to dry the area.  Cleansing has been a bit tricky.  He also started B12 injections monthly.  Situational stressors are that his father lives here now and Debbie is helping to care for him, and Nadeem' partner had squamous cell carcinoma of the anus requiring surgery and anal closure and colostomy recently.   No Known Allergies Current Meds  Medication Sig  . acetaminophen (TYLENOL) 325 MG tablet Take 650 mg by mouth every 6 (six) hours as needed for moderate pain.  Marland Kitchen ALPRAZolam (XANAX XR) 3 MG 24 hr tablet TAKE 1 TABLET (3 MG TOTAL) BY MOUTH EVERY MORNING.  Marland Kitchen alprazolam (XANAX) 2 MG tablet TAKE 1/2 TAB BY MOUTH TWICE DAILY AS NEEDED (Patient taking differently: Take 1 mg by mouth 2 (two) times daily as needed for sleep or anxiety. )  . budesonide (ENTOCORT EC) 3 MG 24 hr capsule TAKE 3 CAPSULES (9 MG TOTAL) BY MOUTH DAILY.  . cyanocobalamin (,VITAMIN B-12,) 1000 MCG/ML injection Inject 1 mL (1,000 mcg total) into the muscle every 30 (thirty) days.  . diphenoxylate-atropine (LOMOTIL) 2.5-0.025 MG tablet TAKE 1 TABLET BY MOUTH 4 TIMES A DAY AS NEEDED FOR DIARRHEA/LOOSE STOOLS  . DULoxetine (CYMBALTA) 60 MG capsule TAKE 1 CAPSULE (60 MG TOTAL) BY MOUTH 2 (TWO) TIMES DAILY. (Patient taking differently: Take 120  mg by mouth daily. )  . emtricitabine-tenofovir AF (DESCOVY) 200-25 MG tablet Take 1 tablet by mouth daily.  . ferrous sulfate 325 (65 FE) MG EC tablet Take 325 mg by mouth daily with breakfast.  . HUMIRA PEN 40 MG/0.4ML PNKT INJECT 40 MG INTO THE SKIN ONCE A WEEK (Patient taking differently: Inject 40 mg into the skin every Wednesday. )  . lisinopril-hydrochlorothiazide (ZESTORETIC) 20-25 MG tablet TAKE 1 TABLET BY MOUTH EVERY DAY (Patient taking differently: Take 1 tablet by mouth daily. )  . ondansetron (ZOFRAN) 4 MG tablet TAKE 1 TAB BY MOUTH EVERY 6 HOURS (Patient taking differently: Take 4 mg by mouth every 6 (six) hours as needed for nausea or vomiting. )  . zolpidem (AMBIEN) 10 MG tablet Take 1 tablet (10 mg total) by mouth at bedtime as needed. for sleep  . [DISCONTINUED] hyoscyamine (LEVBID) 0.375 MG 12  hr tablet Take 1 tablet (0.375 mg total) by mouth 2 (two) times daily.   Current Facility-Administered Medications for the 04/02/20 encounter (Office Visit) with Gatha Mayer, MD  Medication  . cyanocobalamin ((VITAMIN B-12)) injection 1,000 mcg   Past Medical History:  Diagnosis Date  . Anal fissure and fistula    chronic  . Anxiety   . 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 Crohn's disease [facility]) followed by dr Carlean Purl   small and large intestines--  hx bowel resection 02/ 2009--  currently treated w/ humira  . Depression   . Essential hypertension, benign   . History of CMV 10/14/2010   resolved w/ antiviral therapy  . History of kidney stones   . History of resection of small bowel   . Hyperlipidemia   . OSA (obstructive sleep apnea)    01-05-2018 per pt resolved since tonsils removed   . Vitamin D deficiency    Past Surgical History:  Procedure Laterality Date  . ANAL FISTULOTOMY N/A 01/07/2018   Procedure: ANAL FISTULOTOMY;  Surgeon: Leighton Ruff, MD;  Location[facility];  Service: General;  Laterality: N/A;  .  COLONOSCOPY  last one 04-2017   dr Carlean Purl  . EXPLORATORY LAPAROTOMY W/ ILEOSECECTOMY W/ PRIMARY ANASTAMOSIS  08-05-2007    dr gross  [facility]   crohn's, sbo, perferation's  . GANGLION CYST EXCISION Right 2016   wrist  . HYPOSPADIAS CORRECTION  CHILD  . SPHINCTEROTOMY N/A 01/07/2018   Procedure: CHEMICAL SPHINCTEROTOMY (BOTOX);  Surgeon: Leighton Ruff, MD;  Location: [facility];  Service: General;  Laterality: N/A;  . TONSILLECTOMY Bilateral 09/30/2015   Procedure: BILATERAL TONSILLECTOMY;  Surgeon: Izora Gala, MD;  Location: [place];  Service: ENT;  Laterality: Bilateral;  . UPPER GASTROINTESTINAL ENDOSCOPY  10/15/2010   w/biopsy, mild gastritis and duodenitis   Social History   Social History Narrative   HSG, [employer] - 2 years. Married '99 - 60yr/divorced. 2 sons - '98, '00 split custody. 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 No history of physical or sexual abuse.    [employer]   family history includes Allergies in his mother and sister; Asthma in his sister; Cancer in his mother; Gout in his father; Heart disease in his father; Hyperlipidemia in his father; Hypertension in his father; Obesity in his mother; Sleep apnea in his father; Stroke in his maternal grandfather.   Review of Systems As above  Objective:   Physical Exam BP 114/78 (BP Location: Left Arm, Patient Position: Sitting, Cuff Size: Normal)   Pulse 96   Ht 5' 7.25" (1.708 m)   Wt 219 lb 6 oz (99.5 kg)   BMI 34.10 kg/m  Obese pleasant white man in no acute distress  Abdomen is obese there is a midline hernia soft reducible he has tenderness in the epigastrium and right lower quadrant areas mostly without rebound.  Bowel sounds are present.  Inspection of the rectal area shows erythema in the perianal dermatitis anterior to the anus in the area of the perineal body no evidence of perianal abscess.  He has some slightly fleshy anal tag/external hemorrhoid complexes I  will see an open fistula.  No tenderness.

## 2020-04-02 NOTE — Addendum Note (Signed)
Addended by: [place], Fatimata Talsma E on: 04/02/2020 01:53 PM   Modules accepted: Orders

## 2020-04-02 NOTE — Assessment & Plan Note (Signed)
Lab Results  Component Value Date   FERRITIN 31.8 04/02/2020  Ferritin was checked today and is improved.  The stool changes with iron tablets are bothersome he can go to every other day.  He has not been anemic.

## 2020-04-02 NOTE — Patient Instructions (Addendum)
Your provider has requested that you go to the basement level for lab work before leaving today. Press "B" on the elevator. The lab is located at the first door on the left as you exit the elevator.  We have sent  medications to your pharmacy for you to pick up at your convenience.  Decrease your iron to one every other day per Dr Carlean Purl.  We have given you your B12 shot today.  Take your Budesonide for a full 2 months.   Stop the Hycet.  Come back and see [place] in early December.   I appreciate the opportunity to care for you. Silvano Rusk, MD, [facility]

## 2020-04-02 NOTE — Assessment & Plan Note (Signed)
This has recurred.  Mycolog ointment Rx.

## 2020-04-02 NOTE — Assessment & Plan Note (Addendum)
He is on a daily dose after taking an 8-week course of 50,000 units weekly.  Needs to be reassessed.  Neglected to do that today we will do at a follow-up visit.  Unless could be added to today's labs.

## 2020-04-02 NOTE — Assessment & Plan Note (Signed)
No signs of toxicity, he does not have antibodies and his levels on weekly Humira were acceptable.

## 2020-04-05 ENCOUNTER — Other Ambulatory Visit: Payer: Self-pay | Admitting: Internal Medicine

## 2020-04-06 ENCOUNTER — Other Ambulatory Visit: Payer: Self-pay | Admitting: Internal Medicine

## 2020-04-06 LAB — VITAMIN D 1,25 DIHYDROXY
Vitamin D 1, 25 (OH)2 Total: 29 pg/mL (ref 18–72)
Vitamin D2 1, 25 (OH)2: 10 pg/mL
Vitamin D3 1, 25 (OH)2: 19 pg/mL

## 2020-04-06 MED ORDER — VITAMIN D (ERGOCALCIFEROL) 1.25 MG (50000 UNIT) PO CAPS: 50000.0000 [IU] | ORAL_CAPSULE | ORAL | 0 refills | Status: AC

## 2020-04-15 ENCOUNTER — Telehealth: Payer: Self-pay

## 2020-04-15 NOTE — Telephone Encounter (Signed)
[place] has requested more notes. I have faxed them the office note from 04/02/2020 which contains an addendum regarding his care. This went to fax # [phone], I got confirmation that it went thru.

## 2020-04-16 ENCOUNTER — Ambulatory Visit (INDEPENDENT_AMBULATORY_CARE_PROVIDER_SITE_OTHER): Payer: BC Managed Care – PPO | Admitting: Psychologist

## 2020-04-16 DIAGNOSIS — F330 Major depressive disorder, recurrent, mild: Secondary | ICD-10-CM

## 2020-04-16 DIAGNOSIS — F411 Generalized anxiety disorder: Secondary | ICD-10-CM

## 2020-04-25 ENCOUNTER — Ambulatory Visit (INDEPENDENT_AMBULATORY_CARE_PROVIDER_SITE_OTHER): Payer: BC Managed Care – PPO | Admitting: Psychologist

## 2020-04-25 ENCOUNTER — Other Ambulatory Visit: Payer: Self-pay | Admitting: Internal Medicine

## 2020-04-25 DIAGNOSIS — F411 Generalized anxiety disorder: Secondary | ICD-10-CM

## 2020-04-25 DIAGNOSIS — F330 Major depressive disorder, recurrent, mild: Secondary | ICD-10-CM

## 2020-04-25 MED ORDER — OXYCODONE-ACETAMINOPHEN 10-325 MG PO TABS
1.0000 | ORAL_TABLET | Freq: Four times a day (QID) | ORAL | 0 refills | Status: DC | PRN
Start: 2020-04-25 — End: 2020-05-27

## 2020-05-01 ENCOUNTER — Ambulatory Visit (INDEPENDENT_AMBULATORY_CARE_PROVIDER_SITE_OTHER): Payer: BC Managed Care – PPO | Admitting: Internal Medicine

## 2020-05-01 ENCOUNTER — Other Ambulatory Visit: Payer: Self-pay

## 2020-05-01 DIAGNOSIS — E538 Deficiency of other specified B group vitamins: Secondary | ICD-10-CM | POA: Diagnosis not present

## 2020-05-01 MED ORDER — CYANOCOBALAMIN 1000 MCG/ML IJ SOLN
1000.0000 ug | Freq: Once | INTRAMUSCULAR | Status: AC
  Administered 2020-05-01: 1000 ug via INTRAMUSCULAR

## 2020-05-10 ENCOUNTER — Ambulatory Visit (INDEPENDENT_AMBULATORY_CARE_PROVIDER_SITE_OTHER): Payer: BC Managed Care – PPO | Admitting: Psychologist

## 2020-05-10 ENCOUNTER — Other Ambulatory Visit: Payer: Self-pay | Admitting: Internal Medicine

## 2020-05-10 DIAGNOSIS — F411 Generalized anxiety disorder: Secondary | ICD-10-CM | POA: Diagnosis not present

## 2020-05-10 DIAGNOSIS — F330 Major depressive disorder, recurrent, mild: Secondary | ICD-10-CM | POA: Diagnosis not present

## 2020-05-15 ENCOUNTER — Other Ambulatory Visit: Payer: Self-pay | Admitting: Internal Medicine

## 2020-05-15 NOTE — Telephone Encounter (Signed)
Can you please refill for Bastian, thank you Lessie Dings

## 2020-05-21 ENCOUNTER — Other Ambulatory Visit: Payer: Self-pay

## 2020-05-21 ENCOUNTER — Ambulatory Visit: Payer: BC Managed Care – PPO | Admitting: Pharmacist

## 2020-05-21 ENCOUNTER — Other Ambulatory Visit (HOSPITAL_COMMUNITY)
Admission: RE | Admit: 2020-05-21 | Discharge: 2020-05-21 | Disposition: A | Discharge status: Home / Self Care | Payer: BC Managed Care – PPO | Source: Ambulatory Visit | Attending: Infectious Disease | Admitting: Infectious Disease

## 2020-05-21 DIAGNOSIS — Z79899 Other long term (current) drug therapy: Secondary | ICD-10-CM | POA: Insufficient documentation

## 2020-05-21 NOTE — Progress Notes (Signed)
Date:  05/21/2020   HPI: [name] [name] is a 44 y.o. male who presents to the [facility] clinic for HIV PrEP follow-up.  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-colonic resection 12/20/2017   Perianal dermatitis 10/26/2017   Encounter for well adult exam with abnormal findings 05/10/2017   High risk sexual behavior 94/85/4627   Folliculitis 03/50/0938   Ganglion cyst of flexor tendon sheath 11/02/2014   Chronic posterior anal fissure 02/15/2014   Tinea pedis 06/27/2013   Long-term use of immunosuppressant medication - Humira 10/14/2010   Obesity, Class II, BMI 35-39.9 07/20/2007   Anxiety with depression 07/20/2007   Insomnia 07/20/2007   OSA (obstructive sleep apnea) 07/20/2007   Essential hypertension, benign 07/20/2007   CROHN'S DISEASE, LARGE AND SMALL INTESTINES 07/20/2007    Patient's Medications  New Prescriptions   No medications on file  Previous Medications   ACETAMINOPHEN (TYLENOL) 325 MG TABLET    Take 650 mg by mouth every 6 (six) hours as needed for moderate pain.   ALPRAZOLAM (XANAX XR) 3 MG 24 HR TABLET    TAKE 1 TABLET (3 MG TOTAL) BY MOUTH EVERY MORNING.   ALPRAZOLAM (XANAX) 2 MG TABLET    TAKE 1/2 TAB BY MOUTH TWICE DAILY AS NEEDED   BUDESONIDE (ENTOCORT EC) 3 MG 24 HR CAPSULE    TAKE 3 CAPSULES (9 MG TOTAL) BY MOUTH DAILY.   DIPHENOXYLATE-ATROPINE (LOMOTIL) 2.5-0.025 MG TABLET    TAKE 1 TABLET BY MOUTH 4 TIMES A DAY AS NEEDED FOR DIARRHEA/LOOSE STOOLS   DULOXETINE (CYMBALTA) 60 MG CAPSULE    TAKE 1 CAPSULE (60 MG TOTAL) BY MOUTH 2 (TWO) TIMES DAILY.   EMTRICITABINE-TENOFOVIR AF (DESCOVY) 200-25 MG TABLET     Take 1 tablet by mouth daily.   FERROUS SULFATE 325 (65 FE) MG EC TABLET    Take 325 mg by mouth every other day.    HUMIRA PEN 40 MG/0.4ML PNKT    INJECT 40 MG INTO THE SKIN ONCE A WEEK   LISINOPRIL-HYDROCHLOROTHIAZIDE (ZESTORETIC) 20-25 MG TABLET    TAKE 1 TABLET BY MOUTH EVERY DAY   NYSTATIN-TRIAMCINOLONE OINTMENT (MYCOLOG)    Apply 1 application topically 2 (two) times daily.   ONDANSETRON (ZOFRAN) 4 MG TABLET    TAKE 1 TAB BY MOUTH EVERY 6 HOURS   OXYCODONE-ACETAMINOPHEN (PERCOCET) 10-325 MG TABLET    Take 1 tablet by mouth every 6 (six) hours as needed for pain.   VITAMIN D, ERGOCALCIFEROL, (DRISDOL) 1.25 MG (50000 UNIT) CAPS CAPSULE    Take 1 capsule (50,000 Units total) by mouth every 7 (seven) days for 8 doses.   ZOLPIDEM (AMBIEN) 10 MG TABLET    Take 1 tablet (10 mg total) by mouth at bedtime as needed. 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   Crohn's disease ([place]) followed  by dr Carlean Purl   small and large intestines--  hx bowel resection 02/ 2009--  currently treated w/ humira   Depression    Essential hypertension, benign    History of CMV 10/14/2010   resolved w/ antiviral therapy   History of kidney stones    History of resection of small bowel    Hyperlipidemia    OSA (obstructive sleep apnea)    01-05-2018 per pt resolved since tonsils removed    Ventral hernia    Vitamin D deficiency     Social History: Social History   Socioeconomic History   Marital status: Divorced    Spouse name: Not on file   Number of children: 2   Years of education: 14   Highest education level: Not on file  Occupational History   Occupation: Mortgage    Employer: [employer]    Tobacco Use   Smoking status: Never Smoker   Smokeless tobacco: Never Used  Scientific laboratory technician Use: Never used  Substance and Sexual Activity   Alcohol use: Yes    Alcohol/week: 3.0 standard drinks    Types: 3 Shots of liquor per week   Drug use: No   Sexual activity: Yes    Partners: Male    Comment: Truvada protection  Other Topics Concern   Not on file  Social History Narrative   HSG, [place] - 2 years. Married '99 - 1yr/divorced. 2 sons - '98, '00 split custody. 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 No history of physical or sexual abuse.    [employer]  Social Determinants of Health   Financial Resource Strain:    Difficulty of Paying Living Expenses: Not on file  Food Insecurity:    Worried About RCharity fundraiserin the Last Year: Not on file   RYRC Worldwideof Food in the Last Year: Not on file  Transportation Needs:    Lack of Transportation (Medical): Not on file   Lack of Transportation (Non-Medical): Not on file  Physical Activity:    Days of Exercise per Week: Not on file   Minutes of Exercise per Session: Not on file  Stress:    Feeling of Stress : Not on file  Social Connections:    Frequency of Communication with Friends and Family: Not on file   Frequency of Social Gatherings with Friends and Family: Not on file   Attends Religious Services: Not on file   Active Member of Clubs or Organizations: Not on file   Attends CArchivistMeetings: Not on file   Marital Status: Not on file    No flowsheet data found.  Labs:  SCr: Lab Results  Component Value Date   CREATININE 0.90 03/14/2020   CREATININE 1.20 02/13/2020   CREATININE 0.98 11/29/2019   CREATININE 1.00 07/07/2018   CREATININE 1.00 01/07/2018   HIV Lab Results  Component Value Date   HIV NON-REACTIVE 11/29/2019   HIV NON-REACTIVE 07/07/2018   HIV NON-REACTIVE 04/02/2017   HIV NONREACTIVE 06/30/2016   HIV NONREACTIVE  10/02/2014   Hepatitis B Lab Results  Component Value Date   HEPBSAB NON-REACTIVE 12/28/2019   HEPBSAG NON-REACTIVE 12/28/2019   HEPBCAB NON-REACTIVE 12/28/2019   Hepatitis C No results found for: HEPCAB, HCVRNAPCRQN Hepatitis A No results found for: HAV RPR and STI Lab Results  Component Value Date   LABRPR NON-REACTIVE 11/29/2019   LABRPR NON REAC 11/09/2012    No flowsheet data found.  Assessment: Stephanos presents to clinic today for his 55-monthPrEP follow-up. [facility] but was prescribed Truvada via his PCP prior to this. He was agreeable to switching to Descovy at this last appointment, and he says he is tolerating it well. He denies missed doses or concerns. He remains with his one HIV+ partner who sees Dr. HJohnnye Simahere. He stated he only gives/receives oral sex. He and his partner have not had any other partners since his last visit. He states he has enough medicine at this time. Will check HIV ab today and refill Descovy x 6 months if negative. Will have him follow-up with Cassie in 6 months.   Patient requested oral and urine cytologies today. Will defer further testing given recent BMet from September. Of note, patient was not immune to hepatitis B and recently received his two hepatitis B vaccines. Will test for HAV antibody today. Informed him that he could either reach out to our clinic or his PCP to receive the hepatitis A vaccines if he is not immune. He has already completed 3 doses of the Pfizer COVID-19 shots.  Plan: Check HIV ab, HAV ab, urine/oral cytologies Refill Descovy x 6 months if HIV ab negative Follow-up with Cassie on 11/21/20 @ 4:00PM  AAlfonse Spruce PharmD PGY2 ID Pharmacy Resident [phone] 05/21/2020, 4:41 PM

## 2020-05-22 LAB — HIV ANTIBODY (ROUTINE TESTING W REFLEX): HIV 1&2 Ab, 4th Generation: NONREACTIVE

## 2020-05-22 LAB — HEPATITIS A ANTIBODY, TOTAL: Hepatitis A AB,Total: NONREACTIVE

## 2020-05-22 MED ORDER — DESCOVY 200-25 MG PO TABS: 1.0000 | ORAL_TABLET | Freq: Every day | ORAL | 5 refills | Status: DC

## 2020-05-23 LAB — URINE CYTOLOGY ANCILLARY ONLY
Chlamydia: NEGATIVE
Comment: NEGATIVE
Comment: NORMAL
Neisseria Gonorrhea: NEGATIVE

## 2020-05-23 LAB — CYTOLOGY, (ORAL, ANAL, URETHRAL) ANCILLARY ONLY
Chlamydia: NEGATIVE
Comment: NEGATIVE
Comment: NORMAL
Neisseria Gonorrhea: NEGATIVE

## 2020-05-25 ENCOUNTER — Other Ambulatory Visit: Payer: Self-pay | Admitting: Internal Medicine

## 2020-05-27 ENCOUNTER — Ambulatory Visit (INDEPENDENT_AMBULATORY_CARE_PROVIDER_SITE_OTHER): Payer: BC Managed Care – PPO | Admitting: Internal Medicine

## 2020-05-27 ENCOUNTER — Encounter: Payer: Self-pay | Admitting: Internal Medicine

## 2020-05-27 ENCOUNTER — Other Ambulatory Visit: Payer: BC Managed Care – PPO

## 2020-05-27 VITALS — BP 100/70 | HR 80 | Ht 67.25 in | Wt 218.0 lb

## 2020-05-27 DIAGNOSIS — Z79899 Other long term (current) drug therapy: Secondary | ICD-10-CM | POA: Diagnosis not present

## 2020-05-27 DIAGNOSIS — Z7960 Long term (current) use of unspecified immunomodulators and immunosuppressants: Secondary | ICD-10-CM

## 2020-05-27 DIAGNOSIS — Z23 Encounter for immunization: Secondary | ICD-10-CM | POA: Diagnosis not present

## 2020-05-27 DIAGNOSIS — E538 Deficiency of other specified B group vitamins: Secondary | ICD-10-CM | POA: Diagnosis not present

## 2020-05-27 DIAGNOSIS — E559 Vitamin D deficiency, unspecified: Secondary | ICD-10-CM

## 2020-05-27 DIAGNOSIS — K50819 Crohn's disease of both small and large intestine with unspecified complications: Secondary | ICD-10-CM | POA: Diagnosis not present

## 2020-05-27 DIAGNOSIS — R7989 Other specified abnormal findings of blood chemistry: Secondary | ICD-10-CM

## 2020-05-27 MED ORDER — CYANOCOBALAMIN 1000 MCG/ML IJ SOLN
1000.0000 ug | Freq: Once | INTRAMUSCULAR | Status: AC
  Administered 2020-05-27: 1000 ug via INTRAMUSCULAR

## 2020-05-27 MED ORDER — OXYCODONE-ACETAMINOPHEN 10-325 MG PO TABS: 1.0000 | ORAL_TABLET | Freq: Four times a day (QID) | ORAL | 0 refills | Status: DC | PRN

## 2020-05-27 NOTE — Assessment & Plan Note (Signed)
No side effects noted.  Tolerating.  He had a negative hepatitis A total antibody so he is nave, ID recommended immunization so we will do so.

## 2020-05-27 NOTE — Progress Notes (Signed)
[name] [name] 44 y.o. 11-03-1975 [id number]  Assessment & Plan:   CROHN'S DISEASE, LARGE AND SMALL INTESTINES His recent flare seems to be resolved.  I had his imaging reviewed at our IBD conference and the reviewer at conference was not convinced there was significant inflammation on previous CT enterography.  [name] [name] has been under a lot of stress with his partner and his health issues and trying to care for him etc. that may have been contributing to some flare in symptoms.  His adalimumab levels are acceptable and exceed the minimum trough and he does not have antibodies.  Fortunately he feels better at this time.  My plan is for him to stop the budesonide and check a fecal calprotectin.  He will continue his other medications.  From time to time he has some episodes of pain as described in the history today.  I have agreed to prescribe 1510 mg Percocet for him to have on hand but this should last months.  Further plans pending the calprotectin and clinical course.  Long-term use of immunosuppressant medication - Humira No side effects noted.  Tolerating.  He had a negative hepatitis A total antibody so he is nave, ID recommended immunization so we will do so.  Low normal vitamin B12 level Continue B12 supplementation he is a set up for malabsorption given resection and Crohn's  Vitamin D deficiency Continue supplementation and follow-up levels at a later date   Meds ordered this encounter  Medications  . oxyCODONE-acetaminophen (PERCOCET) 10-325 MG tablet    Sig: Take 1 tablet by mouth every 6 (six) hours as needed for pain.    Dispense:  15 tablet    Refill:  0  . cyanocobalamin ((VITAMIN B-12)) injection 1,000 mcg   Medications Discontinued During This Encounter  Medication Reason  . oxyCODONE-acetaminophen (PERCOCET) 10-325 MG tablet Patient Preference  . budesonide (ENTOCORT EC) 3 MG 24 hr capsule    Orders Placed This Encounter  Procedures  . Calprotectin, Fecal  .  Hepatitis A vaccine adult IM      Subjective:   Chief Complaint: Follow-up of Crohn's disease questions about B12 and Entocort  HPI [name] [name] returns unaccompanied, he reports that things seem to be about back to baseline.  No terrible diarrhea no terrible abdominal pain except for occasional episodes where he thinks having a pain pill would be helpful.  His partner is doing much better after his surgery and colostomy.  They are managing well at this point.  The ID clinic tested [name] for hepatitis A immunity and he is nave and they have recommended vaccination and is requesting to do that here.  He is wondering why he does not have a lot more energy since he is on B12 supplementation and his friends tell him that he would.  He would like to stop the budesonide.     No Known Allergies Current Meds  Medication Sig  . acetaminophen (TYLENOL) 325 MG tablet Take 650 mg by mouth every 6 (six) hours as needed for moderate pain.  Marland Kitchen ALPRAZolam (XANAX XR) 3 MG 24 hr tablet TAKE 1 TABLET (3 MG TOTAL) BY MOUTH EVERY MORNING.  Marland Kitchen alprazolam (XANAX) 2 MG tablet TAKE 1/2 TAB BY MOUTH TWICE DAILY AS NEEDED (Patient taking differently: Take 1 mg by mouth 2 (two) times daily as needed for sleep or anxiety. )  . diphenoxylate-atropine (LOMOTIL) 2.5-0.025 MG tablet TAKE 1 TABLET BY MOUTH 4 TIMES A DAY AS NEEDED FOR DIARRHEA/LOOSE STOOLS  . DULoxetine (  CYMBALTA) 60 MG capsule TAKE 1 CAPSULE (60 MG TOTAL) BY MOUTH 2 (TWO) TIMES DAILY. (Patient taking differently: Take 120 mg by mouth daily. )  . emtricitabine-tenofovir AF (DESCOVY) 200-25 MG tablet Take 1 tablet by mouth daily.  . ferrous sulfate 325 (65 FE) MG EC tablet Take 325 mg by mouth every other day.   Marland Kitchen HUMIRA PEN 40 MG/0.4ML PNKT INJECT 40 MG INTO THE SKIN ONCE A WEEK (Patient taking differently: Inject 40 mg into the skin every Wednesday. )  . lisinopril-hydrochlorothiazide (ZESTORETIC) 20-25 MG tablet TAKE 1 TABLET BY MOUTH EVERY DAY (Patient taking  differently: Take 1 tablet by mouth daily. )  . nystatin-triamcinolone ointment (MYCOLOG) Apply 1 application topically 2 (two) times daily.  . ondansetron (ZOFRAN) 4 MG tablet TAKE 1 TAB BY MOUTH EVERY 6 HOURS (Patient taking differently: Take 4 mg by mouth every 6 (six) hours as needed for nausea or vomiting. )  . Vitamin D, Ergocalciferol, (DRISDOL) 1.25 MG (50000 UNIT) CAPS capsule Take 50,000 Units by mouth every 7 (seven) days.  Marland Kitchen zolpidem (AMBIEN) 10 MG tablet Take 1 tablet (10 mg total) by mouth at bedtime as needed. for sleep  . [DISCONTINUED] budesonide (ENTOCORT EC) 3 MG 24 hr capsule TAKE 3 CAPSULES (9 MG TOTAL) BY MOUTH DAILY.   Past Medical History:  Diagnosis Date  . Anal fissure and fistula    chronic  . Anxiety   . 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 Crohn's disease [facility]) followed by dr Carlean Purl   small and large intestines--  hx bowel resection 02/ 2009--  currently treated w/ humira  . Depression   . Essential hypertension, benign   . History of CMV 10/14/2010   resolved w/ antiviral therapy  . History of kidney stones   . History of resection of small bowel   . Hyperlipidemia   . OSA (obstructive sleep apnea)    01-05-2018 per pt resolved since tonsils removed   . Ventral hernia   . Vitamin D deficiency    Past Surgical History:  Procedure Laterality Date  . ANAL FISTULOTOMY N/A 01/07/2018   Procedure: ANAL FISTULOTOMY;  Surgeon: Leighton Ruff, MD;  Location: [facility];  Service: General;  Laterality: N/A;  . COLONOSCOPY  last one 04-2017   dr Carlean Purl  . EXPLORATORY LAPAROTOMY W/ ILEOSECECTOMY W/ PRIMARY ANASTAMOSIS  08-05-2007    dr gross  [facility]   crohn's, sbo, perferation's  . GANGLION CYST EXCISION Right 2016   wrist  . HYPOSPADIAS CORRECTION  CHILD  . SPHINCTEROTOMY N/A 01/07/2018   Procedure: CHEMICAL SPHINCTEROTOMY (BOTOX);  Surgeon: Leighton Ruff, MD;  Location: [facility];  Service:  General;  Laterality: N/A;  . TONSILLECTOMY Bilateral 09/30/2015   Procedure: BILATERAL TONSILLECTOMY;  Surgeon: Izora Gala, MD;  Location: [facility];  Service: ENT;  Laterality: Bilateral;  . UPPER GASTROINTESTINAL ENDOSCOPY  10/15/2010   w/biopsy, mild gastritis and duodenitis   Social History   Social History Narrative   HSG, [place] - 2 years. Married '99 - 58yr/divorced. 2 sons - '98, '00 split custody. 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 No history of physical or sexual abuse.    [employer]   family history includes Allergies in his mother and sister; Asthma in his sister; Cancer in his mother; Gout in his father; Heart disease in his father; Hyperlipidemia in his father; Hypertension in his father; Obesity in his mother; Sleep apnea in his father; Stroke  in his maternal grandfather.   Review of Systems As per HPI  Objective:   Physical Exam BP 100/70 (BP Location: Left Arm, Patient Position: Sitting, Cuff Size: Normal)   Pulse 80   Ht 5' 7.25" (1.708 m)   Wt 218 lb (98.9 kg)   BMI 33.89 kg/m  Pleasant well-developed obese white man no acute distress The abdomen is soft obese nontender.  Midline abdominal scar with ventral hernia.  Nontender.  Reducible.

## 2020-05-27 NOTE — Assessment & Plan Note (Signed)
His recent flare seems to be resolved.  I had his imaging reviewed at our IBD conference and the reviewer at conference was not convinced there was significant inflammation on previous CT enterography.  [name] [name] has been under a lot of stress with his partner and his health issues and trying to care for him etc. that may have been contributing to some flare in symptoms.  His adalimumab levels are acceptable and exceed the minimum trough and he does not have antibodies.  Fortunately he feels better at this time.  My plan is for him to stop the budesonide and check a fecal calprotectin.  He will continue his other medications.  From time to time he has some episodes of pain as described in the history today.  I have agreed to prescribe 1510 mg Percocet for him to have on hand but this should last months.  Further plans pending the calprotectin and clinical course.

## 2020-05-27 NOTE — Patient Instructions (Signed)
Your provider has requested that you go to the basement level for lab work before leaving today. Press "B" on the elevator. The lab is located at the first door on the left as you exit the elevator.  Due to recent changes in healthcare laws, you may see the results of your imaging and laboratory studies on MyChart before your provider has had a chance to review them.  We understand that in some cases there may be results that are confusing or concerning to you. Not all laboratory results come back in the same time frame and the provider may be waiting for multiple results in order to interpret others.  Please give [place] 48 hours in order for your provider to thoroughly review all the results before contacting the office for clarification of your results.   We have sent the following medications to your pharmacy for you to pick up at your convenience: Percocet  Please stop your Budesonide.  We are giving you a B12 injection and your first Hepatitis A vaccine- you will get your second one June 7th 2022.  I appreciate the opportunity to care for you. Silvano Rusk, MD, [facility]

## 2020-05-27 NOTE — Assessment & Plan Note (Signed)
Continue supplementation and follow-up levels at a later date

## 2020-05-27 NOTE — Assessment & Plan Note (Signed)
Continue B12 supplementation he is a set up for malabsorption given resection and Crohn's

## 2020-05-31 ENCOUNTER — Telehealth: Payer: Self-pay | Admitting: Internal Medicine

## 2020-05-31 ENCOUNTER — Ambulatory Visit (INDEPENDENT_AMBULATORY_CARE_PROVIDER_SITE_OTHER): Payer: BC Managed Care – PPO | Admitting: Psychologist

## 2020-05-31 DIAGNOSIS — F410 Panic disorder [episodic paroxysmal anxiety] without agoraphobia: Secondary | ICD-10-CM

## 2020-05-31 DIAGNOSIS — E559 Vitamin D deficiency, unspecified: Secondary | ICD-10-CM

## 2020-05-31 DIAGNOSIS — Z634 Disappearance and death of family member: Secondary | ICD-10-CM

## 2020-05-31 NOTE — Telephone Encounter (Signed)
Please advise Sir, thank you. He started the weekly Vitamin D in Oct. 2021 for 8 weeks total.

## 2020-06-01 ENCOUNTER — Other Ambulatory Visit: Payer: Self-pay | Admitting: Internal Medicine

## 2020-06-03 NOTE — Telephone Encounter (Signed)
Pt informed and order entered.

## 2020-06-03 NOTE — Telephone Encounter (Signed)
Time to recheck vitamin D 25 hydroxy level and will then make recommendation  Dx vit D deficiency

## 2020-06-27 ENCOUNTER — Ambulatory Visit (INDEPENDENT_AMBULATORY_CARE_PROVIDER_SITE_OTHER): Payer: BC Managed Care – PPO | Admitting: Psychologist

## 2020-06-27 DIAGNOSIS — F411 Generalized anxiety disorder: Secondary | ICD-10-CM

## 2020-06-27 DIAGNOSIS — F330 Major depressive disorder, recurrent, mild: Secondary | ICD-10-CM

## 2020-07-01 ENCOUNTER — Other Ambulatory Visit: Payer: Self-pay | Admitting: Internal Medicine

## 2020-07-01 NOTE — Telephone Encounter (Signed)
Please refill as per office routine med refill policy (all routine meds refilled for 3 mo or monthly per pt preference up to one year from last visit, then month to month grace period for 3 mo, then further med refills will have to be denied)  

## 2020-07-12 ENCOUNTER — Ambulatory Visit (INDEPENDENT_AMBULATORY_CARE_PROVIDER_SITE_OTHER): Payer: BC Managed Care – PPO | Admitting: Psychologist

## 2020-07-12 DIAGNOSIS — F411 Generalized anxiety disorder: Secondary | ICD-10-CM

## 2020-07-12 DIAGNOSIS — F330 Major depressive disorder, recurrent, mild: Secondary | ICD-10-CM

## 2020-07-19 ENCOUNTER — Other Ambulatory Visit: Payer: Self-pay | Admitting: Internal Medicine

## 2020-07-22 ENCOUNTER — Other Ambulatory Visit: Payer: Self-pay

## 2020-07-23 ENCOUNTER — Encounter: Payer: Self-pay | Admitting: Internal Medicine

## 2020-07-23 ENCOUNTER — Ambulatory Visit: Payer: BC Managed Care – PPO | Admitting: Internal Medicine

## 2020-07-23 ENCOUNTER — Ambulatory Visit (INDEPENDENT_AMBULATORY_CARE_PROVIDER_SITE_OTHER): Payer: BC Managed Care – PPO | Admitting: Psychologist

## 2020-07-23 VITALS — BP 110/68 | HR 81 | Temp 98.2°F | Ht 67.25 in | Wt 214.0 lb

## 2020-07-23 DIAGNOSIS — F330 Major depressive disorder, recurrent, mild: Secondary | ICD-10-CM

## 2020-07-23 DIAGNOSIS — F411 Generalized anxiety disorder: Secondary | ICD-10-CM

## 2020-07-23 DIAGNOSIS — F418 Other specified anxiety disorders: Secondary | ICD-10-CM | POA: Diagnosis not present

## 2020-07-23 DIAGNOSIS — I10 Essential (primary) hypertension: Secondary | ICD-10-CM | POA: Diagnosis not present

## 2020-07-23 DIAGNOSIS — R739 Hyperglycemia, unspecified: Secondary | ICD-10-CM

## 2020-07-23 DIAGNOSIS — R229 Localized swelling, mass and lump, unspecified: Secondary | ICD-10-CM

## 2020-07-23 MED ORDER — FLUOXETINE HCL 20 MG PO CAPS: 20.0000 mg | ORAL_CAPSULE | Freq: Every day | ORAL | 3 refills | Status: DC

## 2020-07-23 NOTE — Progress Notes (Deleted)
Established Patient Office Visit  Subjective:  Patient ID: [name] [name], male    DOB: 1975-08-06  Age: 45 y.o. MRN: [id number]  CC: No chief complaint on file.   HPI [name] [name] presents for ***  Past Medical History:  Diagnosis Date  . Anal fissure and fistula    chronic  . Anxiety   . 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 Crohn's disease [facility]) followed by dr Carlean Purl   small and large intestines--  hx bowel resection 02/ 2009--  currently treated w/ humira  . Depression   . Essential hypertension, benign   . History of CMV 10/14/2010   resolved w/ antiviral therapy  . History of kidney stones   . History of resection of small bowel   . Hyperlipidemia   . OSA (obstructive sleep apnea)    01-05-2018 per pt resolved since tonsils removed   . Ventral hernia   . Vitamin D deficiency     Past Surgical History:  Procedure Laterality Date  . ANAL FISTULOTOMY N/A 01/07/2018   Procedure: ANAL FISTULOTOMY;  Surgeon: Leighton Ruff, MD;  Location: [facility];  Service: General;  Laterality: N/A;  . COLONOSCOPY  last one 04-2017   dr Carlean Purl  . EXPLORATORY LAPAROTOMY W/ ILEOSECECTOMY W/ PRIMARY ANASTAMOSIS  08-05-2007    dr gross  [facility]   crohn's, sbo, perferation's  . GANGLION CYST EXCISION Right 2016   wrist  . HYPOSPADIAS CORRECTION  CHILD  . SPHINCTEROTOMY N/A 01/07/2018   Procedure: CHEMICAL SPHINCTEROTOMY (BOTOX);  Surgeon: Leighton Ruff, MD;  Location: [facility];  Service: General;  Laterality: N/A;  . TONSILLECTOMY Bilateral 09/30/2015   Procedure: BILATERAL TONSILLECTOMY;  Surgeon: Izora Gala, MD;  Location: [place];  Service: ENT;  Laterality: Bilateral;  . UPPER GASTROINTESTINAL ENDOSCOPY  10/15/2010   w/biopsy, mild gastritis and duodenitis    Family History  Problem Relation Age of Onset  . Heart disease Father        CAD/MI  . Hyperlipidemia Father   . Hypertension Father   . Gout Father   .  Sleep apnea Father   . Obesity Mother   . Allergies Mother   . Cancer Mother   . Stroke Maternal Grandfather   . Allergies Sister   . Asthma Sister   . Colon cancer Neg Hx   . Diabetes Neg Hx   . COPD Neg Hx   . Colon polyps Neg Hx   . Stomach cancer Neg Hx   . Pancreatic cancer Neg Hx     Social History   Socioeconomic History  . Marital status: Divorced    Spouse name: Not on file  . Number of children: 2  . Years of education: 35  . Highest education level: Not on file  Occupational History  . Occupation: Librarian, academic: [employer]  Tobacco Use  . Smoking status: Never Smoker  . Smokeless tobacco: Never Used  Vaping Use  . Vaping Use: Never used  Substance and Sexual Activity  . Alcohol use: Yes    Alcohol/week: 3.0 standard drinks    Types: 3 Shots of liquor per week  . Drug use: No  . Sexual activity: Yes    Partners: Male    Comment: [place] protection  Other Topics Concern  . Not on file  Social History Narrative   HSG, [employer] [facility] [employer] - 2 years. Married '99 - 55yr/divorced. 2 sons - '98, '00 split custody. Work -  customer relations. Lives with partner No exercise. No history of physical or sexual abuse.    [employer]   Social Determinants of Health   Financial Resource Strain: Not on file  Food Insecurity: Not on file  Transportation Needs: Not on file  Physical Activity: Not on file  Stress: Not on file  Social Connections: Not on file  Intimate Partner Violence: Not on file    Outpatient Medications Prior to Visit  Medication Sig Dispense Refill  . acetaminophen (TYLENOL) 325 MG tablet Take 650 mg by mouth every 6 (six) hours as needed for moderate pain.    Marland Kitchen ALPRAZolam (XANAX XR) 3 MG 24 hr tablet TAKE 1 TABLET (3 MG TOTAL) BY MOUTH EVERY MORNING. 30 tablet 5  . alprazolam (XANAX) 2 MG tablet TAKE 1/2 TAB BY MOUTH TWICE DAILY AS NEEDED (Patient taking differently: Take 1 mg by mouth 2 (two) times daily as needed for sleep or  anxiety.) 30 tablet 5  . cyanocobalamin (,VITAMIN B-12,) 1000 MCG/ML injection Inject 1,000 mcg into the muscle every 30 (thirty) days.    . diphenoxylate-atropine (LOMOTIL) 2.5-0.025 MG tablet TAKE 1 TABLET BY MOUTH 4 TIMES A DAY AS NEEDED FOR DIARRHEA/LOOSE STOOLS 90 tablet 1  . DULoxetine (CYMBALTA) 60 MG capsule TAKE 1 CAPSULE (60 MG TOTAL) BY MOUTH 2 (TWO) TIMES DAILY. 180 capsule 1  . emtricitabine-tenofovir AF (DESCOVY) 200-25 MG tablet Take 1 tablet by mouth daily. 30 tablet 5  . ferrous sulfate 325 (65 FE) MG EC tablet Take 325 mg by mouth every other day.     Marland Kitchen HUMIRA PEN 40 MG/0.4ML PNKT INJECT 40 MG INTO THE SKIN ONCE A WEEK (Patient taking differently: Inject 40 mg into the skin every Wednesday.) 12 each 3  . lisinopril-hydrochlorothiazide (ZESTORETIC) 20-25 MG tablet TAKE 1 TABLET BY MOUTH EVERY DAY 90 tablet 0  . nystatin-triamcinolone ointment (MYCOLOG) Apply 1 application topically 2 (two) times daily. 30 g 1  . ondansetron (ZOFRAN) 4 MG tablet TAKE 1 TAB BY MOUTH EVERY 6 HOURS (Patient taking differently: Take 4 mg by mouth every 6 (six) hours as needed for nausea or vomiting.) 30 tablet 1  . zolpidem (AMBIEN) 10 MG tablet Take 1 tablet (10 mg total) by mouth at bedtime as needed. for sleep 30 tablet 5  . oxyCODONE-acetaminophen (PERCOCET) 10-325 MG tablet Take 1 tablet by mouth every 6 (six) hours as needed for pain. 15 tablet 0  . Vitamin D, Ergocalciferol, (DRISDOL) 1.25 MG (50000 UNIT) CAPS capsule Take 50,000 Units by mouth every 7 (seven) days.     No facility-administered medications prior to visit.    No Known Allergies  ROS Review of Systems    Objective:    Physical Exam  BP 110/68   Pulse 81   Temp 98.2 F (36.8 C) (Oral)   Ht 5' 7.25" (1.708 m)   Wt 214 lb (97.1 kg)   SpO2 95%   BMI 33.27 kg/m  Wt Readings from Last 3 Encounters:  07/23/20 214 lb (97.1 kg)  05/27/20 218 lb (98.9 kg)  04/02/20 219 lb 6 oz (99.5 kg)     There are no preventive  care reminders to display for this patient.  There are no preventive care reminders to display for this patient.  Lab Results  Component Value Date   TSH 1.27 11/29/2019   Lab Results  Component Value Date   WBC 9.9 03/14/2020   HGB 15.5 03/14/2020   HCT 45.8 03/14/2020   MCV 98.3  03/14/2020   PLT 204 03/14/2020   Lab Results  Component Value Date   NA 133 (L) 03/14/2020   K 4.3 03/14/2020   CO2 25 03/14/2020   GLUCOSE 98 03/14/2020   BUN 15 03/14/2020   CREATININE 0.90 03/14/2020   BILITOT 0.9 03/14/2020   ALKPHOS 43 03/14/2020   AST 26 03/14/2020   ALT 33 03/14/2020   PROT 7.3 03/14/2020   ALBUMIN 3.8 03/14/2020   CALCIUM 8.8 (L) 03/14/2020   ANIONGAP 10 03/14/2020   GFR 86.54 07/07/2018   Lab Results  Component Value Date   CHOL 254 (H) 11/29/2019   Lab Results  Component Value Date   HDL 45 11/29/2019   Lab Results  Component Value Date   LDLCALC 184 (H) 11/29/2019   Lab Results  Component Value Date   TRIG 119 11/29/2019   Lab Results  Component Value Date   CHOLHDL 5.6 (H) 11/29/2019   Lab Results  Component Value Date   HGBA1C 5.3 11/29/2019      Assessment & Plan:   Problem List Items Addressed This Visit   None     No orders of the defined types were placed in this encounter.   Follow-up: No follow-ups on file.    Cathlean Cower, MD

## 2020-07-23 NOTE — Patient Instructions (Signed)
Please take all new medication as prescribed - the prozac 20 mg per day  Ok to reduce the cymbalta to 60 mg per day for 4 wks, then stop  Please continue all other medications as before, and refills have been done if requested.  Please have the pharmacy call with any other refills you may need.  Please keep your appointments with your specialists as you may have planned

## 2020-07-24 ENCOUNTER — Other Ambulatory Visit: Payer: Self-pay | Admitting: Internal Medicine

## 2020-07-25 ENCOUNTER — Other Ambulatory Visit: Payer: BC Managed Care – PPO

## 2020-07-25 DIAGNOSIS — Z7960 Long term (current) use of unspecified immunomodulators and immunosuppressants: Secondary | ICD-10-CM

## 2020-07-25 DIAGNOSIS — Z79899 Other long term (current) drug therapy: Secondary | ICD-10-CM

## 2020-07-25 DIAGNOSIS — E538 Deficiency of other specified B group vitamins: Secondary | ICD-10-CM

## 2020-07-25 DIAGNOSIS — K50819 Crohn's disease of both small and large intestine with unspecified complications: Secondary | ICD-10-CM

## 2020-07-25 NOTE — Telephone Encounter (Signed)
Done erx 

## 2020-07-29 ENCOUNTER — Encounter: Payer: Self-pay | Admitting: Internal Medicine

## 2020-07-29 ENCOUNTER — Ambulatory Visit (INDEPENDENT_AMBULATORY_CARE_PROVIDER_SITE_OTHER): Payer: BC Managed Care – PPO | Admitting: Psychologist

## 2020-07-29 DIAGNOSIS — R229 Localized swelling, mass and lump, unspecified: Secondary | ICD-10-CM | POA: Insufficient documentation

## 2020-07-29 DIAGNOSIS — F321 Major depressive disorder, single episode, moderate: Secondary | ICD-10-CM

## 2020-07-29 NOTE — Assessment & Plan Note (Signed)
.   Lab Results  Component Value Date   HGBA1C 5.3 11/29/2019   Stable, pt to continue current medical treatment  - diet

## 2020-07-29 NOTE — Progress Notes (Signed)
Patient ID: [name] [name], male   DOB: 1975/10/06, 45 y.o.   MRN: [id number]        Chief Complaint: anxiety, and skin lump to neck       HPI:  [name] [name] is a 45 y.o. male here with c/o 1 mo noticed a firm lump tot he post right neck, no really getting larger, non tender, no trauma, and no overlying skin change.   Pt denies fever, wt loss, night sweats, loss of appetite, or other constitutional symptoms  But Denies worsening depressive symptoms, suicidal ideation, or panic; has ongoing anxiety, much increased recently, and cymbalta not working, asks for change in treatment.    Wt Readings from Last 3 Encounters:  07/23/20 214 lb (97.1 kg)  05/27/20 218 lb (98.9 kg)  04/02/20 219 lb 6 oz (99.5 kg)   BP Readings from Last 3 Encounters:  07/23/20 110/68  05/27/20 100/70  04/02/20 114/78         Past Medical History:  Diagnosis Date  . Anal fissure and fistula    chronic  . Anxiety   . 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 Crohn's disease [facility]) followed by dr Carlean Purl   small and large intestines--  hx bowel resection 02/ 2009--  currently treated w/ humira  . Depression   . Essential hypertension, benign   . History of CMV 10/14/2010   resolved w/ antiviral therapy  . History of kidney stones   . History of resection of small bowel   . Hyperlipidemia   . OSA (obstructive sleep apnea)    01-05-2018 per pt resolved since tonsils removed   . Ventral hernia   . Vitamin D deficiency    Past Surgical History:  Procedure Laterality Date  . ANAL FISTULOTOMY N/A 01/07/2018   Procedure: ANAL FISTULOTOMY;  Surgeon: Leighton Ruff, MD;  Location: [facility];  Service: General;  Laterality: N/A;  . COLONOSCOPY  last one 04-2017   dr Carlean Purl  . EXPLORATORY LAPAROTOMY W/ ILEOSECECTOMY W/ PRIMARY ANASTAMOSIS  08-05-2007    dr gross  [facility]   crohn's, sbo, perferation's  . GANGLION CYST EXCISION Right 2016   wrist  . HYPOSPADIAS CORRECTION   CHILD  . SPHINCTEROTOMY N/A 01/07/2018   Procedure: CHEMICAL SPHINCTEROTOMY (BOTOX);  Surgeon: Leighton Ruff, MD;  Location: [facility];  Service: General;  Laterality: N/A;  . TONSILLECTOMY Bilateral 09/30/2015   Procedure: BILATERAL TONSILLECTOMY;  Surgeon: Izora Gala, MD;  Location: [place];  Service: ENT;  Laterality: Bilateral;  . UPPER GASTROINTESTINAL ENDOSCOPY  10/15/2010   w/biopsy, mild gastritis and duodenitis    reports that he has never smoked. He has never used smokeless tobacco. He reports current alcohol use of about 3.0 standard drinks of alcohol per week. He reports that he does not use drugs. family history includes Allergies in his mother and sister; Asthma in his sister; Cancer in his mother; Gout in his father; Heart disease in his father; Hyperlipidemia in his father; Hypertension in his father; Obesity in his mother; Sleep apnea in his father; Stroke in his maternal grandfather. No Known Allergies Current Outpatient Medications on File Prior to Visit  Medication Sig Dispense Refill  . acetaminophen (TYLENOL) 325 MG tablet Take 650 mg by mouth every 6 (six) hours as needed for moderate pain.    Marland Kitchen ALPRAZolam (XANAX XR) 3 MG 24 hr tablet TAKE 1 TABLET (3 MG TOTAL) BY MOUTH EVERY MORNING. 30 tablet 5  . alprazolam Duanne Moron)  2 MG tablet TAKE 1/2 TAB BY MOUTH TWICE DAILY AS NEEDED (Patient taking differently: Take 1 mg by mouth 2 (two) times daily as needed for sleep or anxiety.) 30 tablet 5  . cyanocobalamin (,VITAMIN B-12,) 1000 MCG/ML injection Inject 1,000 mcg into the muscle every 30 (thirty) days.    . diphenoxylate-atropine (LOMOTIL) 2.5-0.025 MG tablet TAKE 1 TABLET BY MOUTH 4 TIMES A DAY AS NEEDED FOR DIARRHEA/LOOSE STOOLS 90 tablet 1  . DULoxetine (CYMBALTA) 60 MG capsule TAKE 1 CAPSULE (60 MG TOTAL) BY MOUTH 2 (TWO) TIMES DAILY. 180 capsule 1  . emtricitabine-tenofovir AF (DESCOVY) 200-25 MG tablet Take 1 tablet by mouth daily. 30 tablet 5  . ferrous  sulfate 325 (65 FE) MG EC tablet Take 325 mg by mouth every other day.     Marland Kitchen HUMIRA PEN 40 MG/0.4ML PNKT INJECT 40 MG INTO THE SKIN ONCE A WEEK (Patient taking differently: Inject 40 mg into the skin every Wednesday.) 12 each 3  . lisinopril-hydrochlorothiazide (ZESTORETIC) 20-25 MG tablet TAKE 1 TABLET BY MOUTH EVERY DAY 90 tablet 0  . nystatin-triamcinolone ointment (MYCOLOG) Apply 1 application topically 2 (two) times daily. 30 g 1  . ondansetron (ZOFRAN) 4 MG tablet TAKE 1 TAB BY MOUTH EVERY 6 HOURS (Patient taking differently: Take 4 mg by mouth every 6 (six) hours as needed for nausea or vomiting.) 30 tablet 1   No current facility-administered medications on file prior to visit.        ROS:  All others reviewed and negative.  Objective        PE:  BP 110/68   Pulse 81   Temp 98.2 F (36.8 C) (Oral)   Ht 5' 7.25" (1.708 m)   Wt 214 lb (97.1 kg)   SpO2 95%   BMI 33.27 kg/m                 Constitutional: Pt appears in NAD               HENT: Head: NCAT.                Right Ear: External ear normal.                 Left Ear: External ear normal.                Eyes: . Pupils are equal, round, and reactive to light. Conjunctivae and EOM are normal               Nose: without d/c or deformity               Neck: Neck supple. Gross normal ROM               Cardiovascular: Normal rate and regular rhythm.                 Pulmonary/Chest: Effort normal and breath sounds without rales or wheezing.                Abd:  Soft, NT, ND, + BS, no organomegaly               Neurological: Pt is alert. At baseline orientation, motor grossly intact               Skin: Skin is warm. LE edema - none, right post neck with subq firm mobile nodular mass, nontender, no overlying skin change  Psychiatric: Pt behavior is normal without agitation   Micro: none  Cardiac tracings I have personally interpreted today:  none  Pertinent Radiological findings (summarize): none   Lab  Results  Component Value Date   WBC 9.9 03/14/2020   HGB 15.5 03/14/2020   HCT 45.8 03/14/2020   PLT 204 03/14/2020   GLUCOSE 98 03/14/2020   CHOL 254 (H) 11/29/2019   TRIG 119 11/29/2019   HDL 45 11/29/2019   LDLCALC 184 (H) 11/29/2019   ALT 33 03/14/2020   AST 26 03/14/2020   NA 133 (L) 03/14/2020   K 4.3 03/14/2020   CL 98 03/14/2020   CREATININE 0.90 03/14/2020   BUN 15 03/14/2020   CO2 25 03/14/2020   TSH 1.27 11/29/2019   PSA 0.3 11/29/2019   HGBA1C 5.3 11/29/2019   Assessment/Plan:  [name] [name] is a 45 y.o. 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  Anxiety with depression Ok for change cymbalta to prozac 20 mg daily, declines referral counseling  Essential hypertension, benign BP Readings from Last 3 Encounters:  07/23/20 110/68  05/27/20 100/70  04/02/20 114/78   Stable, pt to continue medical treatment zetoretic   Current Outpatient Medications (Cardiovascular):  .  lisinopril-hydrochlorothiazide (ZESTORETIC) 20-25 MG tablet, TAKE 1 TABLET BY MOUTH EVERY DAY   Current Outpatient Medications (Analgesics):  .  acetaminophen (TYLENOL) 325 MG tablet, Take 650 mg by mouth every 6 (six) hours as needed for moderate pain. Marland Kitchen  HUMIRA PEN 40 MG/0.4ML PNKT, INJECT 40 MG INTO THE SKIN ONCE A WEEK (Patient taking differently: Inject 40 mg into the skin every Wednesday.) .  oxyCODONE-acetaminophen (PERCOCET) 10-325 MG tablet, Take 1 tablet by mouth every 6 (six) hours as needed for pain.  Current Outpatient Medications (Hematological):  .  cyanocobalamin (,VITAMIN B-12,) 1000  MCG/ML injection, Inject 1,000 mcg into the muscle every 30 (thirty) days. .  ferrous sulfate 325 (65 FE) MG EC tablet, Take 325 mg by mouth every other day.   Current Outpatient Medications (Other):  Marland Kitchen  ALPRAZolam (XANAX XR) 3 MG 24 hr tablet, TAKE 1 TABLET (3 MG TOTAL) BY MOUTH EVERY MORNING. Marland Kitchen  alprazolam (XANAX) 2 MG tablet, TAKE 1/2 TAB BY MOUTH TWICE DAILY AS NEEDED (Patient taking differently: Take 1 mg by mouth 2 (two) times daily as needed for sleep or anxiety.) .  diphenoxylate-atropine (LOMOTIL) 2.5-0.025 MG tablet, TAKE 1 TABLET BY MOUTH 4 TIMES A DAY AS NEEDED FOR DIARRHEA/LOOSE STOOLS .  DULoxetine (CYMBALTA) 60 MG capsule, TAKE 1 CAPSULE (60 MG TOTAL) BY MOUTH 2 (TWO) TIMES DAILY. Marland Kitchen  emtricitabine-tenofovir AF (DESCOVY) 200-25 MG tablet, Take 1 tablet by mouth daily. Marland Kitchen  FLUoxetine (PROZAC) 20 MG capsule, Take 1 capsule (20 mg total) by mouth daily. Marland Kitchen  nystatin-triamcinolone ointment (MYCOLOG), Apply 1 application topically 2 (two) times daily. .  ondansetron (ZOFRAN) 4 MG tablet, TAKE 1 TAB BY MOUTH EVERY 6 HOURS (Patient taking differently: Take 4 mg by mouth every 6 (six) hours as needed for nausea or vomiting.) .  Vitamin D, Ergocalciferol, (DRISDOL) 1.25 MG (50000 UNIT) CAPS capsule, Take 50,000 Units by mouth every 7 (seven) days. Marland Kitchen  zolpidem (AMBIEN) 10 MG tablet, TAKE  1 TABLET (10 MG TOTAL) BY MOUTH AT BEDTIME AS NEEDED. FOR SLEEP   Hyperglycemia . Lab Results  Component Value Date   HGBA1C 5.3 11/29/2019   Stable, pt to continue current medical treatment  - diet   Skin nodule Benign appearing, d/w pt, ok to follow for change in size, for gen surgu referral if changes  Followup: Return if symptoms worsen or fail to improve.  Cathlean Cower, MD 07/29/2020 9:02 PM [facility]

## 2020-07-29 NOTE — Assessment & Plan Note (Signed)
BP Readings from Last 3 Encounters:  07/23/20 110/68  05/27/20 100/70  04/02/20 114/78   Stable, pt to continue medical treatment zetoretic   Current Outpatient Medications (Cardiovascular):  .  lisinopril-hydrochlorothiazide (ZESTORETIC) 20-25 MG tablet, TAKE 1 TABLET BY MOUTH EVERY DAY   Current Outpatient Medications (Analgesics):  .  acetaminophen (TYLENOL) 325 MG tablet, Take 650 mg by mouth every 6 (six) hours as needed for moderate pain. Marland Kitchen  HUMIRA PEN 40 MG/0.4ML PNKT, INJECT 40 MG INTO THE SKIN ONCE A WEEK (Patient taking differently: Inject 40 mg into the skin every Wednesday.) .  oxyCODONE-acetaminophen (PERCOCET) 10-325 MG tablet, Take 1 tablet by mouth every 6 (six) hours as needed for pain.  Current Outpatient Medications (Hematological):  .  cyanocobalamin (,VITAMIN B-12,) 1000 MCG/ML injection, Inject 1,000 mcg into the muscle every 30 (thirty) days. .  ferrous sulfate 325 (65 FE) MG EC tablet, Take 325 mg by mouth every other day.   Current Outpatient Medications (Other):  Marland Kitchen  ALPRAZolam (XANAX XR) 3 MG 24 hr tablet, TAKE 1 TABLET (3 MG TOTAL) BY MOUTH EVERY MORNING. Marland Kitchen  alprazolam (XANAX) 2 MG tablet, TAKE 1/2 TAB BY MOUTH TWICE DAILY AS NEEDED (Patient taking differently: Take 1 mg by mouth 2 (two) times daily as needed for sleep or anxiety.) .  diphenoxylate-atropine (LOMOTIL) 2.5-0.025 MG tablet, TAKE 1 TABLET BY MOUTH 4 TIMES A DAY AS NEEDED FOR DIARRHEA/LOOSE STOOLS .  DULoxetine (CYMBALTA) 60 MG capsule, TAKE 1 CAPSULE (60 MG TOTAL) BY MOUTH 2 (TWO) TIMES DAILY. Marland Kitchen  emtricitabine-tenofovir AF (DESCOVY) 200-25 MG tablet, Take 1 tablet by mouth daily. Marland Kitchen  FLUoxetine (PROZAC) 20 MG capsule, Take 1 capsule (20 mg total) by mouth daily. Marland Kitchen  nystatin-triamcinolone ointment (MYCOLOG), Apply 1 application topically 2 (two) times daily. .  ondansetron (ZOFRAN) 4 MG tablet, TAKE 1 TAB BY MOUTH EVERY 6 HOURS (Patient taking differently: Take 4 mg by mouth every 6 (six) hours as  needed for nausea or vomiting.) .  Vitamin D, Ergocalciferol, (DRISDOL) 1.25 MG (50000 UNIT) CAPS capsule, Take 50,000 Units by mouth every 7 (seven) days. Marland Kitchen  zolpidem (AMBIEN) 10 MG tablet, TAKE 1 TABLET (10 MG TOTAL) BY MOUTH AT BEDTIME AS NEEDED. FOR SLEEP

## 2020-07-29 NOTE — Assessment & Plan Note (Signed)
Benign appearing, d/w pt, ok to follow for change in size, for gen surgu referral if changes

## 2020-07-29 NOTE — Assessment & Plan Note (Signed)
Ok for change cymbalta to prozac 20 mg daily, declines referral counseling

## 2020-07-30 ENCOUNTER — Other Ambulatory Visit: Payer: Self-pay | Admitting: Internal Medicine

## 2020-07-30 LAB — CALPROTECTIN, FECAL: Calprotectin, Fecal: 40 ug/g (ref 0–120)

## 2020-08-06 ENCOUNTER — Ambulatory Visit: Payer: BC Managed Care – PPO | Admitting: Psychologist

## 2020-08-16 ENCOUNTER — Ambulatory Visit (INDEPENDENT_AMBULATORY_CARE_PROVIDER_SITE_OTHER): Payer: BC Managed Care – PPO | Admitting: Psychologist

## 2020-08-16 DIAGNOSIS — F411 Generalized anxiety disorder: Secondary | ICD-10-CM

## 2020-08-16 DIAGNOSIS — F330 Major depressive disorder, recurrent, mild: Secondary | ICD-10-CM

## 2020-08-19 ENCOUNTER — Telehealth: Payer: Self-pay | Admitting: Internal Medicine

## 2020-08-19 NOTE — Telephone Encounter (Signed)
Prior Josem Kaufmann form has been faxed

## 2020-08-22 ENCOUNTER — Other Ambulatory Visit: Payer: Self-pay | Admitting: Internal Medicine

## 2020-08-22 MED ORDER — OXYCODONE-ACETAMINOPHEN 10-325 MG PO TABS: 1.0000 | ORAL_TABLET | Freq: Four times a day (QID) | ORAL | 0 refills | Status: DC | PRN

## 2020-09-24 ENCOUNTER — Other Ambulatory Visit: Payer: Self-pay | Admitting: Internal Medicine

## 2020-09-24 NOTE — Telephone Encounter (Signed)
Please refill as per office routine med refill policy (all routine meds refilled for 3 mo or monthly per pt preference up to one year from last visit, then month to month grace period for 3 mo, then further med refills will have to be denied)  

## 2020-10-02 ENCOUNTER — Encounter: Payer: Self-pay | Admitting: Internal Medicine

## 2020-10-02 ENCOUNTER — Other Ambulatory Visit: Payer: Self-pay | Admitting: Internal Medicine

## 2020-10-02 ENCOUNTER — Ambulatory Visit: Payer: BC Managed Care – PPO | Admitting: Internal Medicine

## 2020-10-02 ENCOUNTER — Other Ambulatory Visit (INDEPENDENT_AMBULATORY_CARE_PROVIDER_SITE_OTHER): Payer: BC Managed Care – PPO

## 2020-10-02 VITALS — BP 127/72 | HR 66 | Ht 70.0 in | Wt 213.0 lb

## 2020-10-02 DIAGNOSIS — E538 Deficiency of other specified B group vitamins: Secondary | ICD-10-CM | POA: Diagnosis not present

## 2020-10-02 DIAGNOSIS — K5080 Crohn's disease of both small and large intestine without complications: Secondary | ICD-10-CM | POA: Diagnosis not present

## 2020-10-02 DIAGNOSIS — F439 Reaction to severe stress, unspecified: Secondary | ICD-10-CM

## 2020-10-02 DIAGNOSIS — Z79899 Other long term (current) drug therapy: Secondary | ICD-10-CM | POA: Diagnosis not present

## 2020-10-02 DIAGNOSIS — R7989 Other specified abnormal findings of blood chemistry: Secondary | ICD-10-CM

## 2020-10-02 DIAGNOSIS — Z7960 Long term (current) use of unspecified immunomodulators and immunosuppressants: Secondary | ICD-10-CM

## 2020-10-02 DIAGNOSIS — E611 Iron deficiency: Secondary | ICD-10-CM

## 2020-10-02 DIAGNOSIS — E559 Vitamin D deficiency, unspecified: Secondary | ICD-10-CM

## 2020-10-02 LAB — COMPREHENSIVE METABOLIC PANEL
ALT: 15 U/L (ref 0–53)
AST: 16 U/L (ref 0–37)
Albumin: 4.4 g/dL (ref 3.5–5.2)
Alkaline Phosphatase: 53 U/L (ref 39–117)
BUN: 10 mg/dL (ref 6–23)
CO2: 28 mEq/L (ref 19–32)
Calcium: 9.7 mg/dL (ref 8.4–10.5)
Chloride: 99 mEq/L (ref 96–112)
Creatinine, Ser: 0.96 mg/dL (ref 0.40–1.50)
GFR: 95.41 mL/min (ref >60.00)
Glucose, Bld: 86 mg/dL (ref 70–99)
Potassium: 3.9 mEq/L (ref 3.5–5.1)
Sodium: 136 mEq/L (ref 135–145)
Total Bilirubin: 0.6 mg/dL (ref 0.2–1.2)
Total Protein: 7.9 g/dL (ref 6.0–8.3)

## 2020-10-02 LAB — CBC WITH DIFFERENTIAL/PLATELET
Basophils Absolute: 0.1 10*3/uL (ref 0.0–0.1)
Basophils Relative: 0.9 % (ref 0.0–3.0)
Eosinophils Absolute: 0.2 10*3/uL (ref 0.0–0.7)
Eosinophils Relative: 2 % (ref 0.0–5.0)
HCT: 48.2 % (ref 39.0–52.0)
Hemoglobin: 16.1 g/dL (ref 13.0–17.0)
Lymphocytes Relative: 41.4 % (ref 12.0–46.0)
Lymphs Abs: 3.6 10*3/uL (ref 0.7–4.0)
MCHC: 33.4 g/dL (ref 30.0–36.0)
MCV: 99.2 fl (ref 78.0–100.0)
Monocytes Absolute: 0.9 10*3/uL (ref 0.1–1.0)
Monocytes Relative: 10 % (ref 3.0–12.0)
Neutro Abs: 3.9 10*3/uL (ref 1.4–7.7)
Neutrophils Relative %: 45.7 % (ref 43.0–77.0)
Platelets: 236 10*3/uL (ref 150.0–400.0)
RBC: 4.85 Mil/uL (ref 4.22–5.81)
RDW: 13.9 % (ref 11.5–15.5)
WBC: 8.6 10*3/uL (ref 4.0–10.5)

## 2020-10-02 LAB — VITAMIN B12: Vitamin B-12: 196 pg/mL — ABNORMAL LOW (ref 211–911)

## 2020-10-02 MED ORDER — OXYCODONE-ACETAMINOPHEN 10-325 MG PO TABS: 1.0000 | ORAL_TABLET | Freq: Four times a day (QID) | ORAL | 0 refills | Status: DC | PRN

## 2020-10-02 NOTE — Assessment & Plan Note (Signed)
Recheck labs today. 

## 2020-10-02 NOTE — Patient Instructions (Signed)
Your provider has requested that you go to the basement level for lab work before leaving today. Press "B" on the elevator. The lab is located at the first door on the left as you exit the elevator.  Due to recent changes in healthcare laws, you may see the results of your imaging and laboratory studies on MyChart before your provider has had a chance to review them.  We understand that in some cases there may be results that are confusing or concerning to you. Not all laboratory results come back in the same time frame and the provider may be waiting for multiple results in order to interpret others.  Please give [place] 48 hours in order for your provider to thoroughly review all the results before contacting the office for clarification of your results.   I appreciate the opportunity to care for you. Silvano Rusk, MD, [facility]

## 2020-10-02 NOTE — Assessment & Plan Note (Addendum)
Doing well recent fecal calprotectin is normal.  This tells [place] inflammation is under control.  Continue weekly Humira. Routine follow-up 6 months approximately sooner as needed

## 2020-10-02 NOTE — Assessment & Plan Note (Signed)
Recheck levels

## 2020-10-02 NOTE — Assessment & Plan Note (Signed)
No current issues.  QuantiFERON in July.

## 2020-10-02 NOTE — Progress Notes (Signed)
[name] [name] 45 y.o. [birth year] [id number]  Assessment & Plan:   CROHN'S DISEASE, LARGE AND SMALL INTESTINES Doing well recent fecal calprotectin is normal.  This tells [place] inflammation is under control.  Continue weekly Humira. Routine follow-up 6 months approximately sooner as needed  Long-term use of immunosuppressant medication - Humira No current issues.  QuantiFERON in July.  Low normal vitamin B12 level Recheck levels  Vitamin D deficiency Recheck levels  Iron deficiency Recheck labs today  Orders Placed This Encounter  Procedures  . Vitamin B12  . CBC with Differential/Platelet  . Comprehensive metabolic panel   Meds ordered this encounter  Medications  . oxyCODONE-acetaminophen (PERCOCET) 10-325 MG tablet    Sig: Take 1 tablet by mouth every 6 (six) hours as needed for pain.    Dispense:  15 tablet    Refill:  0   I appreciate the opportunity to care for this patient. CC: [name] Borg, MD  Subjective:   Chief Complaint: Follow-up of Crohn's  HPI [name] [name] is here for follow-up of Crohn's disease of the small and large intestines, doing well on Humira overall.  A couple of months ago he submitted fecal calprotectin and it was normal.  He has occasional abdominal pain and occasionally needs hydrocodone.  He keeps a small limited supply on hand.  Has been under situational stress with his partner [name] [name] who had to have a colostomy.  Patient had a surgery in October and then is still has the changes colostomy.  [name] [name] is also dealing with the death of his father and a job change.  So there is stress in the house.  As far as his Crohn's disease he thinks overall is going well there is no significant diarrhea except occasional.  His perianal abscess and fistula are not flaring.  He has lost some weight.  At last visit we discussed eating changes.  For some reason he is not getting his B12 injections right now.  Anal fissure will occasionally flaring him put the  diltiazem lidocaine ointment and but that seems to be rare  Wt Readings from Last 3 Encounters:  10/02/20 213 lb (96.6 kg)  07/23/20 214 lb (97.1 kg)  05/27/20 218 lb (98.9 kg)   [name] [name] just purchased a house and has been busy moving as well. No Known Allergies Current Meds  Medication Sig  . acetaminophen (TYLENOL) 325 MG tablet Take 650 mg by mouth every 6 (six) hours as needed for moderate pain.  Marland Kitchen ALPRAZolam (XANAX XR) 3 MG 24 hr tablet TAKE 1 TABLET (3 MG TOTAL) BY MOUTH EVERY MORNING.  Marland Kitchen alprazolam (XANAX) 2 MG tablet TAKE 1/2 TAB BY MOUTH TWICE DAILY AS NEEDED.  . cyanocobalamin (,VITAMIN B-12,) 1000 MCG/ML injection Inject 1,000 mcg into the muscle every 30 (thirty) days.  . diphenoxylate-atropine (LOMOTIL) 2.5-0.025 MG tablet TAKE 1 TABLET BY MOUTH 4 TIMES A DAY AS NEEDED FOR DIARRHEA/LOOSE STOOLS  . DULoxetine (CYMBALTA) 60 MG capsule TAKE 1 CAPSULE (60 MG TOTAL) BY MOUTH 2 (TWO) TIMES DAILY.  Marland Kitchen emtricitabine-tenofovir AF (DESCOVY) 200-25 MG tablet Take 1 tablet by mouth daily.  . ferrous sulfate 325 (65 FE) MG EC tablet Take 325 mg by mouth every other day.   Marland Kitchen FLUoxetine (PROZAC) 20 MG capsule Take 1 capsule (20 mg total) by mouth daily.  Marland Kitchen HUMIRA PEN 40 MG/0.4ML PNKT INJECT 40 MG INTO THE SKIN ONCE A WEEK (Patient taking differently: Inject 40 mg into the skin every Wednesday.)  . lisinopril-hydrochlorothiazide (ZESTORETIC) 20-25  MG tablet TAKE 1 TABLET BY MOUTH EVERY DAY  . nystatin-triamcinolone ointment (MYCOLOG) Apply 1 application topically 2 (two) times daily.  . ondansetron (ZOFRAN) 4 MG tablet TAKE 1 TAB BY MOUTH EVERY 6 HOURS (Patient taking differently: Take 4 mg by mouth every 6 (six) hours as needed for nausea or vomiting.)  . oxyCODONE-acetaminophen (PERCOCET) 10-325 MG tablet Take 1 tablet by mouth every 6 (six) hours as needed for pain.  Marland Kitchen zolpidem (AMBIEN) 10 MG tablet TAKE 1 TABLET (10 MG TOTAL) BY MOUTH AT BEDTIME AS NEEDED. FOR SLEEP   Past Medical History:   Diagnosis Date  . Anal fissure and fistula    chronic  . Anxiety   . 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 Crohn's disease [facility]) followed by dr Carlean Purl   small and large intestines--  hx bowel resection 02/ 2009--  currently treated w/ humira  . Depression   . Essential hypertension, benign   . History of CMV 10/14/2010   resolved w/ antiviral therapy  . History of kidney stones   . History of resection of small bowel   . Hyperlipidemia   . OSA (obstructive sleep apnea)    01-05-2018 per pt resolved since tonsils removed   . Ventral hernia   . Vitamin D deficiency    Past Surgical History:  Procedure Laterality Date  . ANAL FISTULOTOMY N/A 01/07/2018   Procedure: ANAL FISTULOTOMY;  Surgeon: Leighton Ruff, MD;  Location: [facility];  Service: General;  Laterality: N/A;  . COLONOSCOPY  last one 04-2017   dr Carlean Purl  . EXPLORATORY LAPAROTOMY W/ ILEOSECECTOMY W/ PRIMARY ANASTAMOSIS  08-05-2007    dr gross  [facility]   crohn's, sbo, perferation's  . GANGLION CYST EXCISION Right 2016   wrist  . HYPOSPADIAS CORRECTION  CHILD  . SPHINCTEROTOMY N/A 01/07/2018   Procedure: CHEMICAL SPHINCTEROTOMY (BOTOX);  Surgeon: Leighton Ruff, MD;  Location: [facility];  Service: General;  Laterality: N/A;  . TONSILLECTOMY Bilateral 09/30/2015   Procedure: BILATERAL TONSILLECTOMY;  Surgeon: Izora Gala, MD;  Location: [place];  Service: ENT;  Laterality: Bilateral;  . UPPER GASTROINTESTINAL ENDOSCOPY  10/15/2010   w/biopsy, mild gastritis and duodenitis   Social History   Social History Narrative   HSG, [place] - 2 years. Married '99 - 22yr/divorced. 2 sons - '98, '00 split custody. 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 No history of physical or sexual abuse.    [name] [employer]   family history includes Allergies in his mother and sister; Asthma in his sister; Cancer in his mother; Gout in his father; Heart  disease in his father; Hyperlipidemia in his father; Hypertension in his father; Obesity in his mother; Sleep apnea in his father; Stroke in his maternal grandfather.   Review of Systems As above  Objective:   Physical Exam BP 127/72   Pulse 66   Ht 5' 10"  (1.778 m)   Wt 213 lb (96.6 kg)   SpO2 96%   BMI 30.56 kg/m  Obese pleasant white man in no acute distress  Lungs are clear Heart sounds are normal S1-S2 no rubs or gallops The abdomen is obese there are surgical scars is mildly tender in the right lower quadrant in the area where he injected Humira today.  It is not completely reproducible and there is not anything of concern.  Bowel sounds are present. Perianal area is slightly erythematous in the right 12 o'clock position away from the  anus there is a little pinpoint opening with a tag associated but there is no drainage there is no sign of fistula or abscess. Perianal ok

## 2020-10-03 ENCOUNTER — Other Ambulatory Visit (INDEPENDENT_AMBULATORY_CARE_PROVIDER_SITE_OTHER): Payer: BC Managed Care – PPO

## 2020-10-03 ENCOUNTER — Other Ambulatory Visit: Payer: Self-pay | Admitting: Internal Medicine

## 2020-10-03 DIAGNOSIS — E559 Vitamin D deficiency, unspecified: Secondary | ICD-10-CM

## 2020-10-03 LAB — VITAMIN D 25 HYDROXY (VIT D DEFICIENCY, FRACTURES): VITD: 37 ng/mL (ref 30.00–100.00)

## 2020-10-03 NOTE — Telephone Encounter (Signed)
Please advise Sir? 

## 2020-10-03 NOTE — Progress Notes (Signed)
vit

## 2020-10-03 NOTE — Telephone Encounter (Signed)
I called him and he will come next week for his B12 injection. The lab will run the vitamin D level on blood drawn yesterday.

## 2020-10-03 NOTE — Telephone Encounter (Signed)
I meant to recheck vit D level and I did not Please try to add 25 OH vit d level to labs from yesterday His B12 is low - needs to restart monthly injections

## 2020-10-10 ENCOUNTER — Ambulatory Visit (INDEPENDENT_AMBULATORY_CARE_PROVIDER_SITE_OTHER): Payer: BC Managed Care – PPO | Admitting: Internal Medicine

## 2020-10-10 ENCOUNTER — Encounter: Payer: Self-pay | Admitting: Pharmacist

## 2020-10-10 DIAGNOSIS — E538 Deficiency of other specified B group vitamins: Secondary | ICD-10-CM

## 2020-10-10 MED ORDER — CYANOCOBALAMIN 1000 MCG/ML IJ SOLN
1000.0000 ug | Freq: Once | INTRAMUSCULAR | Status: AC
  Administered 2020-10-10: 1000 ug via INTRAMUSCULAR

## 2020-10-18 ENCOUNTER — Other Ambulatory Visit: Payer: Self-pay | Admitting: Internal Medicine

## 2020-10-18 NOTE — Telephone Encounter (Signed)
Please refill as per office routine med refill policy (all routine meds refilled for 3 mo or monthly per pt preference up to one year from last visit, then month to month grace period for 3 mo, then further med refills will have to be denied)  

## 2020-10-30 ENCOUNTER — Ambulatory Visit (INDEPENDENT_AMBULATORY_CARE_PROVIDER_SITE_OTHER): Payer: BC Managed Care – PPO | Admitting: Psychology

## 2020-10-30 DIAGNOSIS — F330 Major depressive disorder, recurrent, mild: Secondary | ICD-10-CM

## 2020-10-30 DIAGNOSIS — F411 Generalized anxiety disorder: Secondary | ICD-10-CM | POA: Diagnosis not present

## 2020-11-04 ENCOUNTER — Other Ambulatory Visit: Payer: Self-pay | Admitting: Internal Medicine

## 2020-11-04 MED ORDER — OXYCODONE-ACETAMINOPHEN 10-325 MG PO TABS: 1.0000 | ORAL_TABLET | Freq: Four times a day (QID) | ORAL | 0 refills | Status: DC | PRN

## 2020-11-14 ENCOUNTER — Ambulatory Visit (INDEPENDENT_AMBULATORY_CARE_PROVIDER_SITE_OTHER): Payer: BC Managed Care – PPO | Admitting: Psychology

## 2020-11-14 DIAGNOSIS — F330 Major depressive disorder, recurrent, mild: Secondary | ICD-10-CM

## 2020-11-14 DIAGNOSIS — F411 Generalized anxiety disorder: Secondary | ICD-10-CM | POA: Diagnosis not present

## 2020-11-20 ENCOUNTER — Other Ambulatory Visit: Payer: Self-pay

## 2020-11-21 ENCOUNTER — Encounter: Payer: Self-pay | Admitting: Internal Medicine

## 2020-11-21 ENCOUNTER — Telehealth: Payer: Self-pay | Admitting: Internal Medicine

## 2020-11-21 ENCOUNTER — Other Ambulatory Visit: Payer: Self-pay

## 2020-11-21 ENCOUNTER — Ambulatory Visit: Payer: BC Managed Care – PPO | Admitting: Pharmacist

## 2020-11-21 ENCOUNTER — Ambulatory Visit: Payer: BC Managed Care – PPO | Admitting: Internal Medicine

## 2020-11-21 VITALS — BP 100/66 | HR 78 | Temp 98.4°F | Ht 70.0 in | Wt 212.0 lb

## 2020-11-21 DIAGNOSIS — R739 Hyperglycemia, unspecified: Secondary | ICD-10-CM | POA: Diagnosis not present

## 2020-11-21 DIAGNOSIS — Z0000 Encounter for general adult medical examination without abnormal findings: Secondary | ICD-10-CM | POA: Diagnosis not present

## 2020-11-21 DIAGNOSIS — F418 Other specified anxiety disorders: Secondary | ICD-10-CM | POA: Diagnosis not present

## 2020-11-21 DIAGNOSIS — E559 Vitamin D deficiency, unspecified: Secondary | ICD-10-CM | POA: Diagnosis not present

## 2020-11-21 DIAGNOSIS — Z125 Encounter for screening for malignant neoplasm of prostate: Secondary | ICD-10-CM

## 2020-11-21 DIAGNOSIS — Z0001 Encounter for general adult medical examination with abnormal findings: Secondary | ICD-10-CM | POA: Diagnosis not present

## 2020-11-21 DIAGNOSIS — E538 Deficiency of other specified B group vitamins: Secondary | ICD-10-CM | POA: Diagnosis not present

## 2020-11-21 DIAGNOSIS — I10 Essential (primary) hypertension: Secondary | ICD-10-CM

## 2020-11-21 LAB — CBC WITH DIFFERENTIAL/PLATELET
Basophils Absolute: 0.1 10*3/uL (ref 0.0–0.1)
Basophils Relative: 0.8 % (ref 0.0–3.0)
Eosinophils Absolute: 0.1 10*3/uL (ref 0.0–0.7)
Eosinophils Relative: 1.4 % (ref 0.0–5.0)
HCT: 45.8 % (ref 39.0–52.0)
Hemoglobin: 15.5 g/dL (ref 13.0–17.0)
Lymphocytes Relative: 29.8 % (ref 12.0–46.0)
Lymphs Abs: 2.8 10*3/uL (ref 0.7–4.0)
MCHC: 33.8 g/dL (ref 30.0–36.0)
MCV: 98.5 fl (ref 78.0–100.0)
Monocytes Absolute: 0.9 10*3/uL (ref 0.1–1.0)
Monocytes Relative: 9.9 % (ref 3.0–12.0)
Neutro Abs: 5.4 10*3/uL (ref 1.4–7.7)
Neutrophils Relative %: 58.1 % (ref 43.0–77.0)
Platelets: 255 10*3/uL (ref 150.0–400.0)
RBC: 4.65 Mil/uL (ref 4.22–5.81)
RDW: 13.7 % (ref 11.5–15.5)
WBC: 9.3 10*3/uL (ref 4.0–10.5)

## 2020-11-21 LAB — PSA: PSA: 0.41 ng/mL (ref 0.10–4.00)

## 2020-11-21 LAB — HEMOGLOBIN A1C: Hgb A1c MFr Bld: 5.8 % (ref 4.6–6.5)

## 2020-11-21 LAB — TSH: TSH: 1.5 u[IU]/mL (ref 0.35–4.50)

## 2020-11-21 LAB — VITAMIN B12: Vitamin B-12: >1550 pg/mL — ABNORMAL HIGH (ref 211–911)

## 2020-11-21 LAB — VITAMIN D 25 HYDROXY (VIT D DEFICIENCY, FRACTURES): VITD: 30.66 ng/mL (ref 30.00–100.00)

## 2020-11-21 MED ORDER — FLUOXETINE HCL 40 MG PO CAPS: 40.0000 mg | ORAL_CAPSULE | Freq: Every day | ORAL | 3 refills | Status: DC

## 2020-11-21 MED ORDER — CYANOCOBALAMIN 1000 MCG/ML IJ SOLN
1000.0000 ug | Freq: Once | INTRAMUSCULAR | Status: AC
  Administered 2020-11-21: 1000 ug via INTRAMUSCULAR

## 2020-11-21 NOTE — Patient Instructions (Addendum)
You had the b12 shot today;  You can plan to come here for monthly B12 shots or GI if you prefer  Ok to increase the prozac to 40 mg due to the "poop out effect"    You will be contacted regarding the referral for: psychiatry  Please continue all other medications as before, and refills have been done if requested.  Please have the pharmacy call with any other refills you may need.  Please continue your efforts at being more active, low cholesterol diet, and weight control.  You are otherwise up to date with prevention measures today.  Please keep your appointments with your specialists as you may have planned  Please go to the LAB at the blood drawing area for the tests to be done  You will be contacted by phone if any changes need to be made immediately.  Otherwise, you will receive a letter about your results with an explanation, but please check with MyChart first.  Please remember to sign up for MyChart if you have not done so, as this will be important to you in the future with finding out test results, communicating by private email, and scheduling acute appointments online when needed.  Please make an Appointment to return in 6 months, or sooner if needed

## 2020-11-21 NOTE — Progress Notes (Signed)
Patient ID: [name] [name], male   DOB: 07-28-1975, 45 y.o.   MRN: [id number]      Chief Complaint:: wellness exam and Medication Problem (Pt states his anxiety is more out of the roof. He feels more uncomfortable on these medications. He feels very jittery. Medication was working until about 3.5 weeks ago.)  and low b12, htn, hyperglycemia, anxiety depression       HPI:  [name] [name] is a 45 y.o. male here for wellness exam; pt iwll consider second covid booster soon; o/w up to date with preventive referrals and immunizations                        Also taking b12 IM sporadically.  Due today. Has severe ongoing anxiety, was initially on prozac and improved but then seemed to get worse again after 2 mo. Was on cymbalta, not sure what to do now.  Asks also for psychiatry referral, and wil continue to see counseling as well.  Denies worsening depressive symptoms, suicidal ideation.  Pt denies chest pain, increased sob or doe, wheezing, orthopnea, PND, increased LE swelling, palpitations, dizziness or syncope.   Pt denies polydipsia, polyuria, or new focal neuro s/s.   Pt denies fever, wt loss, night sweats, loss of appetite, or other constitutional symptoms  No other new complaints  Wt Readings from Last 3 Encounters:  10/02/20 213 lb (96.6 kg)  07/23/20 214 lb (97.1 kg)  05/27/20 218 lb (98.9 kg)   BP Readings from Last 3 Encounters:  10/02/20 127/72  07/23/20 110/68  05/27/20 100/70   Immunization History  Administered Date(s) Administered  . Hepatitis A, Adult 05/27/2020  . Hepb-cpg 01/03/2020, 02/05/2020  . Influenza Split 07/21/2012  . Influenza,inj,Quad PF,6+ Mos 05/09/2013, 04/06/2014, 03/16/2020  . Influenza-Unspecified 02/26/2015, 03/26/2016, 03/18/2019  . PFIZER(Purple Top)SARS-COV-2 Vaccination 09/20/2019, 10/12/2019, 04/07/2020  . PPD Test 10/13/2011, 03/18/2015  . Pneumococcal Conjugate-13 12/20/2017  . Tdap 07/28/2013  There are no preventive care reminders to display  for this patient.  Past Medical History:  Diagnosis Date  . Anal fissure and fistula    chronic  . Anxiety   . 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 Crohn's disease [facility]) followed by dr Carlean Purl   small and large intestines--  hx bowel resection 02/ 2009--  currently treated w/ humira  . Depression   . Essential hypertension, benign   . History of CMV 10/14/2010   resolved w/ antiviral therapy  . History of kidney stones   . History of resection of small bowel   . Hyperlipidemia   . OSA (obstructive sleep apnea)    01-05-2018 per pt resolved since tonsils removed   . Ventral hernia   . Vitamin D deficiency    Past Surgical History:  Procedure Laterality Date  . ANAL FISTULOTOMY N/A 01/07/2018   Procedure: ANAL FISTULOTOMY;  Surgeon: Leighton Ruff, MD;  Location: [facility];  Service: General;  Laterality: N/A;  . COLONOSCOPY  last one 04-2017   dr Carlean Purl  . EXPLORATORY LAPAROTOMY W/ ILEOSECECTOMY W/ PRIMARY ANASTAMOSIS  08-05-2007    dr gross  [facility]   crohn's, sbo, perferation's  . GANGLION CYST EXCISION Right 2016   wrist  . HYPOSPADIAS CORRECTION  CHILD  . SPHINCTEROTOMY N/A 01/07/2018   Procedure: CHEMICAL SPHINCTEROTOMY (BOTOX);  Surgeon: Leighton Ruff, MD;  Location: [facility];  Service: General;  Laterality: N/A;  . TONSILLECTOMY Bilateral 09/30/2015   Procedure: BILATERAL TONSILLECTOMY;  Surgeon: Izora Gala, MD;  Location: [place];  Service: ENT;  Laterality: Bilateral;  . UPPER GASTROINTESTINAL ENDOSCOPY  10/15/2010   w/biopsy, mild gastritis and duodenitis    reports that he has never smoked. He has never used smokeless tobacco. He reports current alcohol use of about 3.0 standard drinks of alcohol per week. He reports that he does not use drugs. family history includes Allergies in his mother and sister; Asthma in his sister; Cancer in his mother; Gout in his father; Heart disease in his father;  Hyperlipidemia in his father; Hypertension in his father; Obesity in his mother; Sleep apnea in his father; Stroke in his maternal grandfather. No Known Allergies Current Outpatient Medications on File Prior to Visit  Medication Sig Dispense Refill  . acetaminophen (TYLENOL) 325 MG tablet Take 650 mg by mouth every 6 (six) hours as needed for moderate pain.    Marland Kitchen ALPRAZolam (XANAX XR) 3 MG 24 hr tablet TAKE 1 TABLET (3 MG TOTAL) BY MOUTH EVERY MORNING. 30 tablet 5  . alprazolam (XANAX) 2 MG tablet TAKE 1/2 TAB BY MOUTH TWICE DAILY AS NEEDED. 30 tablet 5  . cyanocobalamin (,VITAMIN B-12,) 1000 MCG/ML injection Inject 1,000 mcg into the muscle every 30 (thirty) days.    . diphenoxylate-atropine (LOMOTIL) 2.5-0.025 MG tablet TAKE 1 TABLET BY MOUTH 4 TIMES A DAY AS NEEDED FOR DIARRHEA/LOOSE STOOLS 90 tablet 1  . emtricitabine-tenofovir AF (DESCOVY) 200-25 MG tablet Take 1 tablet by mouth daily. 30 tablet 5  . ferrous sulfate 325 (65 FE) MG EC tablet Take 325 mg by mouth every other day.     Marland Kitchen HUMIRA PEN 40 MG/0.4ML PNKT INJECT 40 MG INTO THE SKIN ONCE A WEEK (Patient taking differently: Inject 40 mg into the skin every Wednesday.) 12 each 3  . lisinopril-hydrochlorothiazide (ZESTORETIC) 20-25 MG tablet TAKE 1 TABLET BY MOUTH EVERY DAY 90 tablet 0  . nystatin-triamcinolone ointment (MYCOLOG) Apply 1 application topically 2 (two) times daily. 30 g 1  . ondansetron (ZOFRAN) 4 MG tablet TAKE 1 TAB BY MOUTH EVERY 6 HOURS (Patient taking differently: Take 4 mg by mouth every 6 (six) hours as needed for nausea or vomiting.) 30 tablet 1  . oxyCODONE-acetaminophen (PERCOCET) 10-325 MG tablet Take 1 tablet by mouth every 6 (six) hours as needed for pain. 15 tablet 0  . zolpidem (AMBIEN) 10 MG tablet TAKE 1 TABLET (10 MG TOTAL) BY MOUTH AT BEDTIME AS NEEDED. FOR SLEEP 30 tablet 5   No current facility-administered medications on file prior to visit.        ROS:  All others reviewed and negative.  Objective         PE:  BP 100/66 (BP Location: Left Arm, Patient Position: Sitting, Cuff Size: Large)   Pulse 78   Temp 98.4 F (36.9 C) (Oral)   Ht 5' 10"  (1.778 m)   Wt 212 lb (96.2 kg)   SpO2 98%   BMI 30.42 kg/m                 Constitutional: Pt appears in NAD               HENT: Head: NCAT.                Right Ear: External ear normal.                 Left Ear: External ear normal.  Eyes: . Pupils are equal, round, and reactive to light. Conjunctivae and EOM are normal               Nose: without d/c or deformity               Neck: Neck supple. Gross normal ROM               Cardiovascular: Normal rate and regular rhythm.                 Pulmonary/Chest: Effort normal and breath sounds without rales or wheezing.                Abd:  Soft, NT, ND, + BS, no organomegaly               Neurological: Pt is alert. At baseline orientation, motor grossly intact               Skin: Skin is warm. No rashes, no other new lesions, LE edema - none               Psychiatric: Pt behavior is normal without agitation   Micro: none  Cardiac tracings I have personally interpreted today:  none  Pertinent Radiological findings (summarize): none   Lab Results  Component Value Date   WBC 8.6 10/02/2020   HGB 16.1 10/02/2020   HCT 48.2 10/02/2020   PLT 236.0 10/02/2020   GLUCOSE 86 10/02/2020   CHOL 254 (H) 11/29/2019   TRIG 119 11/29/2019   HDL 45 11/29/2019   LDLCALC 184 (H) 11/29/2019   ALT 15 10/02/2020   AST 16 10/02/2020   NA 136 10/02/2020   K 3.9 10/02/2020   CL 99 10/02/2020   CREATININE 0.96 10/02/2020   BUN 10 10/02/2020   CO2 28 10/02/2020   TSH 1.27 11/29/2019   PSA 0.3 11/29/2019   HGBA1C 5.3 11/29/2019   Assessment/Plan:  [name] [name] is a 45 y.o. White or Caucasian [1] male with  has a past medical history of Anal fissure and fistula, Anxiety, Bile salt-induced diarrhea after ielo-colonic resection (12/20/2017), Crohn's disease ([place]) (followed by dr  Carlean Purl), Depression, Essential hypertension, benign, History of CMV (10/14/2010), History of kidney stones, History of resection of small bowel, Hyperlipidemia, OSA (obstructive sleep apnea), Ventral hernia, and Vitamin D deficiency.  B12 deficiency Lab Results  Component Value Date   VITAMINB12 196 (L) 10/02/2020   Low to continue IM replament on more regular basis - for shot today  Vitamin D deficiency Last vitamin D Lab Results  Component Value Date   VD25OH 37.00 10/03/2020   Stable, cont oral replacement   Encounter for well adult exam with abnormal findings Age and sex appropriate education and counseling updated with regular exercise and diet Referrals for preventative services - none needed Immunizations addressed - for covid booster soon Smoking counseling  - none needed Evidence for depression or other mood disorder - see below Most recent labs reviewed. I have personally reviewed and have noted: 1) the patient's medical and social history 2) The patient's current medications and supplements 3) The patient's height, weight, and BMI have been recorded in the chart   Anxiety with depression Uncontrolled, for increased prozac 40 qd, and refer psychiatry  Essential hypertension, benign BP Readings from Last 3 Encounters:  11/21/20 100/66  10/02/20 127/72  07/23/20 110/68   Stable, pt to continue medical treatment zestoretic   Hyperglycemia Lab Results  Component Value Date   HGBA1C 5.3  11/29/2019   Stable, pt to continue current medical treatment  - diet   Followup: Return in about 6 months (around 05/23/2021).  Cathlean Cower, MD 11/21/2020 9:03 PM [facility]'

## 2020-11-21 NOTE — Assessment & Plan Note (Signed)
Uncontrolled, for increased prozac 40 qd, and refer psychiatry

## 2020-11-21 NOTE — Progress Notes (Signed)
B12 given via IM Left Deltoid per Dr. Jenny Reichmann

## 2020-11-21 NOTE — Telephone Encounter (Signed)
I spoke with [name] [name] and made him an appointment for tomorrow. We will give him his B12 then and he is having problems he wants to discuss with Dr Carlean Purl.

## 2020-11-21 NOTE — Assessment & Plan Note (Signed)
BP Readings from Last 3 Encounters:  11/21/20 100/66  10/02/20 127/72  07/23/20 110/68   Stable, pt to continue medical treatment zestoretic

## 2020-11-21 NOTE — Assessment & Plan Note (Addendum)
Lab Results  Component Value Date   VITAMINB12 196 (L) 10/02/2020   Low to continue IM replament on more regular basis - for shot today

## 2020-11-21 NOTE — Progress Notes (Deleted)
Patient ID: [name] [name], male   DOB: 11-02-1975, 45 y.o.   MRN: [id number]         Chief Complaint:: wellness exam and Medication Problem (Pt states his anxiety is more out of the roof. He feels more uncomfortable on these medications. He feels very jittery. Medication was working until about 3.5 weeks ago.)  ***       HPI:  [name] [name] is a 45 y.o. male here for wellness exam; pt iwll consider second covid booster soon;                         Also taking b12 IM sporadically.  Due today.    Now on prozac instead of cymbalta, on prozac for about 2 mo.   Wt Readings from Last 3 Encounters:  10/02/20 213 lb (96.6 kg)  07/23/20 214 lb (97.1 kg)  05/27/20 218 lb (98.9 kg)   BP Readings from Last 3 Encounters:  10/02/20 127/72  07/23/20 110/68  05/27/20 100/70   Immunization History  Administered Date(s) Administered  . Hepatitis A, Adult 05/27/2020  . Hepb-cpg 01/03/2020, 02/05/2020  . Influenza Split 07/21/2012  . Influenza,inj,Quad PF,6+ Mos 05/09/2013, 04/06/2014, 03/16/2020  . Influenza-Unspecified 02/26/2015, 03/26/2016, 03/18/2019  . PFIZER(Purple Top)SARS-COV-2 Vaccination 09/20/2019, 10/12/2019, 04/07/2020  . PPD Test 10/13/2011, 03/18/2015  . Pneumococcal Conjugate-13 12/20/2017  . Tdap 07/28/2013  There are no preventive care reminders to display for this patient.    Past Medical History:  Diagnosis Date  . Anal fissure and fistula    chronic  . Anxiety   . 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 Crohn's disease [facility]) followed by dr Carlean Purl   small and large intestines--  hx bowel resection 02/ 2009--  currently treated w/ humira  . Depression   . Essential hypertension, benign   . History of CMV 10/14/2010   resolved w/ antiviral therapy  . History of kidney stones   . History of resection of small bowel   . Hyperlipidemia   . OSA (obstructive sleep apnea)    01-05-2018 per pt resolved since tonsils removed   . Ventral  hernia   . Vitamin D deficiency    Past Surgical History:  Procedure Laterality Date  . ANAL FISTULOTOMY N/A 01/07/2018   Procedure: ANAL FISTULOTOMY;  Surgeon: Leighton Ruff, MD;  Location: [facility];  Service: General;  Laterality: N/A;  . COLONOSCOPY  last one 04-2017   dr Carlean Purl  . EXPLORATORY LAPAROTOMY W/ ILEOSECECTOMY W/ PRIMARY ANASTAMOSIS  08-05-2007    dr gross  [facility]   crohn's, sbo, perferation's  . GANGLION CYST EXCISION Right 2016   wrist  . HYPOSPADIAS CORRECTION  CHILD  . SPHINCTEROTOMY N/A 01/07/2018   Procedure: CHEMICAL SPHINCTEROTOMY (BOTOX);  Surgeon: Leighton Ruff, MD;  Location: [facility];  Service: General;  Laterality: N/A;  . TONSILLECTOMY Bilateral 09/30/2015   Procedure: BILATERAL TONSILLECTOMY;  Surgeon: Izora Gala, MD;  Location: [place];  Service: ENT;  Laterality: Bilateral;  . UPPER GASTROINTESTINAL ENDOSCOPY  10/15/2010   w/biopsy, mild gastritis and duodenitis    reports that he has never smoked. He has never used smokeless tobacco. He reports current alcohol use of about 3.0 standard drinks of alcohol per week. He reports that he does not use drugs. family history includes Allergies in his mother and sister; Asthma in his sister; Cancer in his mother; Gout in his father; Heart disease in his father; Hyperlipidemia in  his father; Hypertension in his father; Obesity in his mother; Sleep apnea in his father; Stroke in his maternal grandfather. No Known Allergies Current Outpatient Medications on File Prior to Visit  Medication Sig Dispense Refill  . acetaminophen (TYLENOL) 325 MG tablet Take 650 mg by mouth every 6 (six) hours as needed for moderate pain.    Marland Kitchen ALPRAZolam (XANAX XR) 3 MG 24 hr tablet TAKE 1 TABLET (3 MG TOTAL) BY MOUTH EVERY MORNING. 30 tablet 5  . alprazolam (XANAX) 2 MG tablet TAKE 1/2 TAB BY MOUTH TWICE DAILY AS NEEDED. 30 tablet 5  . cyanocobalamin (,VITAMIN B-12,) 1000 MCG/ML injection Inject 1,000 mcg  into the muscle every 30 (thirty) days.    . diphenoxylate-atropine (LOMOTIL) 2.5-0.025 MG tablet TAKE 1 TABLET BY MOUTH 4 TIMES A DAY AS NEEDED FOR DIARRHEA/LOOSE STOOLS 90 tablet 1  . DULoxetine (CYMBALTA) 60 MG capsule TAKE 1 CAPSULE (60 MG TOTAL) BY MOUTH 2 (TWO) TIMES DAILY. 180 capsule 1  . emtricitabine-tenofovir AF (DESCOVY) 200-25 MG tablet Take 1 tablet by mouth daily. 30 tablet 5  . ferrous sulfate 325 (65 FE) MG EC tablet Take 325 mg by mouth every other day.     Marland Kitchen FLUoxetine (PROZAC) 20 MG capsule Take 1 capsule (20 mg total) by mouth daily. 90 capsule 3  . HUMIRA PEN 40 MG/0.4ML PNKT INJECT 40 MG INTO THE SKIN ONCE A WEEK (Patient taking differently: Inject 40 mg into the skin every Wednesday.) 12 each 3  . lisinopril-hydrochlorothiazide (ZESTORETIC) 20-25 MG tablet TAKE 1 TABLET BY MOUTH EVERY DAY 90 tablet 0  . nystatin-triamcinolone ointment (MYCOLOG) Apply 1 application topically 2 (two) times daily. 30 g 1  . ondansetron (ZOFRAN) 4 MG tablet TAKE 1 TAB BY MOUTH EVERY 6 HOURS (Patient taking differently: Take 4 mg by mouth every 6 (six) hours as needed for nausea or vomiting.) 30 tablet 1  . oxyCODONE-acetaminophen (PERCOCET) 10-325 MG tablet Take 1 tablet by mouth every 6 (six) hours as needed for pain. 15 tablet 0  . zolpidem (AMBIEN) 10 MG tablet TAKE 1 TABLET (10 MG TOTAL) BY MOUTH AT BEDTIME AS NEEDED. FOR SLEEP 30 tablet 5   No current facility-administered medications on file prior to visit.        ROS:  All others reviewed and negative.  Objective        PE:  There were no vitals taken for this visit.                Constitutional: Pt appears in NAD               HENT: Head: NCAT.                Right Ear: External ear normal.                 Left Ear: External ear normal.                Eyes: . Pupils are equal, round, and reactive to light. Conjunctivae and EOM are normal               Nose: without d/c or deformity               Neck: Neck supple. Gross normal  ROM               Cardiovascular: Normal rate and regular rhythm.                 Pulmonary/Chest:  Effort normal and breath sounds without rales or wheezing.                Abd:  Soft, NT, ND, + BS, no organomegaly               Neurological: Pt is alert. At baseline orientation, motor grossly intact               Skin: Skin is warm. No rashes, no other new lesions, LE edema - ***               Psychiatric: Pt behavior is normal without agitation   Micro: none  Cardiac tracings I have personally interpreted today:  none  Pertinent Radiological findings (summarize): none   Lab Results  Component Value Date   WBC 8.6 10/02/2020   HGB 16.1 10/02/2020   HCT 48.2 10/02/2020   PLT 236.0 10/02/2020   GLUCOSE 86 10/02/2020   CHOL 254 (H) 11/29/2019   TRIG 119 11/29/2019   HDL 45 11/29/2019   LDLCALC 184 (H) 11/29/2019   ALT 15 10/02/2020   AST 16 10/02/2020   NA 136 10/02/2020   K 3.9 10/02/2020   CL 99 10/02/2020   CREATININE 0.96 10/02/2020   BUN 10 10/02/2020   CO2 28 10/02/2020   TSH 1.27 11/29/2019   PSA 0.3 11/29/2019   HGBA1C 5.3 11/29/2019   Assessment/Plan:  [name] [name] is a 45 y.o. 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  No problem-specific Assessment & Plan notes found for this encounter.  Followup: No follow-ups on file.  Cathlean Cower, MD 11/21/2020 9:50 AM [facility]

## 2020-11-21 NOTE — Telephone Encounter (Signed)
Inbound call from patient. States he does not believe he gotten his monthly B12 shots and if it would okay if his PCP can give it today when he goes to see them?

## 2020-11-21 NOTE — Assessment & Plan Note (Signed)
Lab Results  Component Value Date   HGBA1C 5.3 11/29/2019   Stable, pt to continue current medical treatment  - diet

## 2020-11-21 NOTE — Assessment & Plan Note (Signed)
Age and sex appropriate education and counseling updated with regular exercise and diet Referrals for preventative services - none needed Immunizations addressed - for covid booster soon Smoking counseling  - none needed Evidence for depression or other mood disorder - see below Most recent labs reviewed. I have personally reviewed and have noted: 1) the patient's medical and social history 2) The patient's current medications and supplements 3) The patient's height, weight, and BMI have been recorded in the chart

## 2020-11-21 NOTE — Assessment & Plan Note (Signed)
Last vitamin D Lab Results  Component Value Date   VD25OH 37.00 10/03/2020   Stable, cont oral replacement

## 2020-11-22 ENCOUNTER — Ambulatory Visit (INDEPENDENT_AMBULATORY_CARE_PROVIDER_SITE_OTHER): Payer: BC Managed Care – PPO | Admitting: Internal Medicine

## 2020-11-22 ENCOUNTER — Encounter: Payer: Self-pay | Admitting: Internal Medicine

## 2020-11-22 VITALS — BP 120/70 | HR 80 | Ht 67.25 in | Wt 215.4 lb

## 2020-11-22 DIAGNOSIS — R1011 Right upper quadrant pain: Secondary | ICD-10-CM

## 2020-11-22 DIAGNOSIS — Z636 Dependent relative needing care at home: Secondary | ICD-10-CM

## 2020-11-22 DIAGNOSIS — F4323 Adjustment disorder with mixed anxiety and depressed mood: Secondary | ICD-10-CM

## 2020-11-22 DIAGNOSIS — K5080 Crohn's disease of both small and large intestine without complications: Secondary | ICD-10-CM

## 2020-11-22 DIAGNOSIS — K9089 Other intestinal malabsorption: Secondary | ICD-10-CM

## 2020-11-22 LAB — BASIC METABOLIC PANEL
BUN: 22 mg/dL (ref 6–23)
CO2: 22 mEq/L (ref 19–32)
Calcium: 10.1 mg/dL (ref 8.4–10.5)
Chloride: 99 mEq/L (ref 96–112)
Creatinine, Ser: 1.1 mg/dL (ref 0.40–1.50)
GFR: 80.95 mL/min (ref >60.00)
Glucose, Bld: 88 mg/dL (ref 70–99)
Potassium: 4.6 mEq/L (ref 3.5–5.1)
Sodium: 137 mEq/L (ref 135–145)

## 2020-11-22 LAB — HEPATIC FUNCTION PANEL
ALT: 11 U/L (ref 0–53)
AST: 14 U/L (ref 0–37)
Albumin: 4.7 g/dL (ref 3.5–5.2)
Alkaline Phosphatase: 59 U/L (ref 39–117)
Bilirubin, Direct: 0 mg/dL (ref 0.0–0.3)
Total Bilirubin: 0.3 mg/dL (ref 0.2–1.2)
Total Protein: 8 g/dL (ref 6.0–8.3)

## 2020-11-22 LAB — LIPID PANEL
Cholesterol: 294 mg/dL — ABNORMAL HIGH (ref 0–200)
HDL: 51.5 mg/dL (ref >39.00)
LDL Cholesterol: 207 mg/dL — ABNORMAL HIGH (ref 0–99)
NonHDL: 242.15
Total CHOL/HDL Ratio: 6
Triglycerides: 178 mg/dL — ABNORMAL HIGH (ref 0.0–149.0)
VLDL: 35.6 mg/dL (ref 0.0–40.0)

## 2020-11-22 MED ORDER — OXYCODONE-ACETAMINOPHEN 10-325 MG PO TABS: 1.0000 | ORAL_TABLET | Freq: Four times a day (QID) | ORAL | 0 refills | Status: DC | PRN

## 2020-11-22 NOTE — Progress Notes (Signed)
[name] [name] 45 y.o. 04/15/1976 [id number]  Assessment & Plan:   Encounter Diagnoses  Name Primary?  . Abdominal wall pain in right upper quadrant Yes  . Crohn's disease of both small and large intestine without complication ([place])   . 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 Adjustment disorder with mixed anxiety and depressed mood   . Caregiver burden    My exam indicates abdominal wall pain.  I think this could respond to injection therapy with steroids.  I will have him see Dr. Bryan Lemma for evaluation and treatment.   Otherwise continue current care.  Hemoglobin is normal iron levels had improved.  Agree with psychiatry and cognitive behavioral therapy.  He is not interested in increasing medication unless absolutely necessary.  See me in 6 months   CC: Biagio Borg, MD Dr. Gerrit Heck   Subjective:   Chief Complaint: Crohn's disease and right upper quadrant pain  HPI [name] [name] presents for follow-up.  He is continue to have some right lower quadrant pain though his Crohn's is otherwise okay.  I have given him some hydrocodone medication to take for that as needed and he is interested in a refill.  Fair amount of stress with his husband's colostomy and illness.  His mood is off he is going to see a psychiatrist and he continues with a therapist though he had taken a break he reestablished.  He describes a pain in the right upper quadrant area that is worse with movement or twisting.  It is not affected by eating or bowel habits  He continues on B12 therapy which was started for B12 of 196 in April.  Had labs through Dr. Jenny Reichmann yesterday I think those are still pending.   Marland KitchenNo Known Allergies Current Meds  Medication Sig  . acetaminophen (TYLENOL) 325 MG tablet Take 650 mg by mouth every 6 (six) hours as needed for moderate pain.  Marland Kitchen ALPRAZolam (XANAX XR) 3 MG 24 hr tablet TAKE 1 TABLET (3 MG TOTAL) BY MOUTH EVERY MORNING.  Marland Kitchen alprazolam (XANAX)  2 MG tablet TAKE 1/2 TAB BY MOUTH TWICE DAILY AS NEEDED.  . cyanocobalamin (,VITAMIN B-12,) 1000 MCG/ML injection Inject 1,000 mcg into the muscle every 30 (thirty) days.  . diphenoxylate-atropine (LOMOTIL) 2.5-0.025 MG tablet TAKE 1 TABLET BY MOUTH 4 TIMES A DAY AS NEEDED FOR DIARRHEA/LOOSE STOOLS  . emtricitabine-tenofovir AF (DESCOVY) 200-25 MG tablet Take 1 tablet by mouth daily.  . ferrous sulfate 325 (65 FE) MG EC tablet Take 325 mg by mouth every other day.   Marland Kitchen FLUoxetine (PROZAC) 40 MG capsule Take 1 capsule (40 mg total) by mouth daily.  Marland Kitchen HUMIRA PEN 40 MG/0.4ML PNKT INJECT 40 MG INTO THE SKIN ONCE A WEEK (Patient taking differently: Inject 40 mg into the skin every Wednesday.)  . lisinopril-hydrochlorothiazide (ZESTORETIC) 20-25 MG tablet TAKE 1 TABLET BY MOUTH EVERY DAY  . nystatin-triamcinolone ointment (MYCOLOG) Apply 1 application topically 2 (two) times daily.  . ondansetron (ZOFRAN) 4 MG tablet TAKE 1 TAB BY MOUTH EVERY 6 HOURS (Patient taking differently: Take 4 mg by mouth every 6 (six) hours as needed for nausea or vomiting.)  . zolpidem (AMBIEN) 10 MG tablet TAKE 1 TABLET (10 MG TOTAL) BY MOUTH AT BEDTIME AS NEEDED. FOR SLEEP  . [DISCONTINUED] oxyCODONE-acetaminophen (PERCOCET) 10-325 MG tablet Take 1 tablet by mouth every 6 (six) hours as needed for pain.   Past Medical History:  Diagnosis Date  . Anal fissure and fistula  chronic  . Anxiety   . 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 Crohn's disease [facility]) followed by dr Carlean Purl   small and large intestines--  hx bowel resection 02/ 2009--  currently treated w/ humira  . Depression   . Essential hypertension, benign   . History of CMV 10/14/2010   resolved w/ antiviral therapy  . History of kidney stones   . History of resection of small bowel   . Hyperlipidemia   . OSA (obstructive sleep apnea)    01-05-2018 per pt resolved since tonsils removed   . Ventral hernia   . Vitamin D  deficiency    Past Surgical History:  Procedure Laterality Date  . ANAL FISTULOTOMY N/A 01/07/2018   Procedure: ANAL FISTULOTOMY;  Surgeon: Leighton Ruff, MD;  Location: [facility];  Service: General;  Laterality: N/A;  . COLONOSCOPY  last one 04-2017   dr Carlean Purl  . EXPLORATORY LAPAROTOMY W/ ILEOSECECTOMY W/ PRIMARY ANASTAMOSIS  08-05-2007    dr gross  [facility]   crohn's, sbo, perferation's  . GANGLION CYST EXCISION Right 2016   wrist  . HYPOSPADIAS CORRECTION  CHILD  . SPHINCTEROTOMY N/A 01/07/2018   Procedure: CHEMICAL SPHINCTEROTOMY (BOTOX);  Surgeon: Leighton Ruff, MD;  Location: [facility];  Service: General;  Laterality: N/A;  . TONSILLECTOMY Bilateral 09/30/2015   Procedure: BILATERAL TONSILLECTOMY;  Surgeon: Izora Gala, MD;  Location: [place];  Service: ENT;  Laterality: Bilateral;  . UPPER GASTROINTESTINAL ENDOSCOPY  10/15/2010   w/biopsy, mild gastritis and duodenitis   Social History   Social History Narrative   HSG, [employer] - 2 years. Married '99 - 87yr/divorced. 2 sons - '98, '00 split custody. 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 No history of physical or sexual abuse.    [employer]   family history includes Allergies in his mother and sister; Asthma in his sister; Cancer in his mother; Gout in his father; Heart disease in his father; Hyperlipidemia in his father; Hypertension in his father; Obesity in his mother; Sleep apnea in his father; Stroke in his maternal grandfather.   Review of Systems   Objective:   Physical Exam BP 120/70   Pulse 80   Ht 5' 7.25" (1.708 m)   Wt 215 lb 6.4 oz (97.7 kg)   BMI 33.49 kg/m  Obese middle-aged white man in no acute distress  Abdomen is obese there is a midline surgical scar and small hernia.  He is tender in the right upper and mid quadrant and there is a definite Carnett's sign in my opinion.  Lower ribs are slightly tender as well.

## 2020-11-22 NOTE — Patient Instructions (Addendum)
Great to see you today.   We will make you an appointment to see Dr Gerrit Heck for an abdominal wall injection.  Dr Carlean Purl has refilled your pain medicine.  Come back and see [place] in 6 months please.   I appreciate the opportunity to care for you. Silvano Rusk, MD, [facility]

## 2020-11-25 ENCOUNTER — Ambulatory Visit (INDEPENDENT_AMBULATORY_CARE_PROVIDER_SITE_OTHER): Payer: BC Managed Care – PPO | Admitting: Pharmacist

## 2020-11-25 ENCOUNTER — Other Ambulatory Visit: Payer: Self-pay

## 2020-11-25 DIAGNOSIS — Z79899 Other long term (current) drug therapy: Secondary | ICD-10-CM

## 2020-11-25 NOTE — Progress Notes (Signed)
Date:  11/29/2020   HPI: [name] [name] is a 45 y.o. male who presents to the [place] clinic for HIV PrEP follow-up.  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 12/20/2017   Perianal dermatitis 10/26/2017   Encounter for well adult exam with abnormal findings 05/10/2017   High risk sexual behavior 26/71/2458   Folliculitis 09/98/3382   Chronic tonsillitis 10/14/2015   Ganglion cyst of flexor tendon sheath 11/02/2014   Chronic posterior anal fissure 02/15/2014   Tinea pedis 06/27/2013   Long-term use of immunosuppressant medication - Humira 10/14/2010   Obesity, Class II, BMI 35-39.9 07/20/2007   Anxiety with depression 07/20/2007   Insomnia 07/20/2007   OSA (obstructive sleep apnea) 07/20/2007   Essential hypertension, benign 07/20/2007   CROHN'S DISEASE, LARGE AND SMALL INTESTINES 07/20/2007    Patient's Medications  New Prescriptions   ROSUVASTATIN (CRESTOR) 20 MG TABLET    Take 1 tablet (20 mg total) by mouth daily.  Previous Medications   ACETAMINOPHEN (TYLENOL) 325 MG TABLET    Take 650 mg by mouth every 6 (six) hours as needed for moderate pain.   ALPRAZOLAM (XANAX XR) 3 MG 24 HR TABLET    TAKE 1 TABLET (3 MG TOTAL) BY MOUTH EVERY MORNING.   ALPRAZOLAM (XANAX) 2 MG TABLET    TAKE 1/2 TAB BY MOUTH TWICE DAILY AS NEEDED.   CYANOCOBALAMIN (,VITAMIN B-12,) 1000 MCG/ML INJECTION    Inject 1,000 mcg into the muscle every 30 (thirty) days.   DIPHENOXYLATE-ATROPINE (LOMOTIL) 2.5-0.025 MG TABLET    TAKE 1 TABLET BY MOUTH 4 TIMES A DAY AS NEEDED FOR DIARRHEA/LOOSE STOOLS   FERROUS  SULFATE 325 (65 FE) MG EC TABLET    Take 325 mg by mouth every other day.    FLUOXETINE (PROZAC) 40 MG CAPSULE    Take 1 capsule (40 mg total) by mouth daily.   HUMIRA PEN 40 MG/0.4ML PNKT    INJECT 40 MG INTO THE SKIN ONCE A WEEK   LISINOPRIL-HYDROCHLOROTHIAZIDE (ZESTORETIC) 20-25 MG TABLET    TAKE 1 TABLET BY MOUTH EVERY DAY   NYSTATIN-TRIAMCINOLONE OINTMENT (MYCOLOG)    Apply 1 application topically 2 (two) times daily.   ONDANSETRON (ZOFRAN) 4 MG TABLET    TAKE 1 TAB BY MOUTH EVERY 6 HOURS   OXYCODONE-ACETAMINOPHEN (PERCOCET) 10-325 MG TABLET    Take 1 tablet by mouth every 6 (six) hours as needed for pain.   ZOLPIDEM (AMBIEN) 10 MG TABLET    TAKE 1 TABLET (10 MG TOTAL) BY MOUTH AT BEDTIME AS NEEDED. FOR SLEEP  Modified Medications   Modified Medication Previous Medication   EMTRICITABINE-TENOFOVIR AF (DESCOVY) 200-25 MG TABLET emtricitabine-tenofovir AF (DESCOVY) 200-25 MG tablet      Take 1 tablet by mouth daily.    Take 1 tablet by mouth daily.  Discontinued Medications   No medications on file    Allergies: No Known Allergies  Past Medical History: Past Medical History:  Diagnosis Date   Anal fissure and fistula    chronic   Anxiety    Bile salt-induced  diarrhea after ielo-colonic resection 12/20/2017   Crohn's disease ([place]) followed by dr Carlean Purl   small and large intestines--  hx bowel resection 02/ 2009--  currently treated w/ humira   Depression    Essential hypertension, benign    History of CMV 10/14/2010   resolved w/ antiviral therapy   History of kidney stones    History of resection of small bowel    Hyperlipidemia    OSA (obstructive sleep apnea)    01-05-2018 per pt resolved since tonsils removed    Ventral hernia    Vitamin D deficiency     Social History: Social History   Socioeconomic History   Marital status: Divorced    Spouse name: Not on file   Number of children: 2   Years of education: 14   Highest education level: Not on file  Occupational  History   Occupation: Mortgage    Employer: [employer]  Tobacco Use   Smoking status: Never   Smokeless tobacco: Never  Vaping Use   Vaping Use: Never used  Substance and Sexual Activity   Alcohol use: Yes    Alcohol/week: 3.0 standard drinks    Types: 3 Shots of liquor per week   Drug use: No   Sexual activity: Yes    Partners: Male    Comment: Truvada protection  Other Topics Concern   Not on file  Social History Narrative   HSG, [employer] - 2 years. Married '99 - 36yr/divorced. 2 sons - '98, '00 split custody. 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 No history of physical or sexual abuse.    [employer]  Social Determinants of Health   Financial Resource Strain: Not on file  Food Insecurity: Not on file  Transportation Needs: Not on file  Physical Activity: Not on file  Stress: Not on file  Social Connections: Not on file    No flowsheet data found.  Labs:  SCr: Lab Results  Component Value Date   CREATININE 1.10 11/21/2020   CREATININE 0.96 10/02/2020   CREATININE 0.90 03/14/2020   CREATININE 1.20 02/13/2020   CREATININE 0.98 11/29/2019   HIV Lab Results  Component Value Date   HIV NON-REACTIVE 11/25/2020   HIV NON-REACTIVE 05/21/2020   HIV NON-REACTIVE 11/29/2019   HIV NON-REACTIVE 07/07/2018   HIV NON-REACTIVE 04/02/2017   Hepatitis B Lab Results  Component Value Date   HEPBSAB NON-REACTIVE 12/28/2019   HEPBSAG NON-REACTIVE 12/28/2019   HEPBCAB NON-REACTIVE 12/28/2019   Hepatitis C No results found for: HEPCAB, HCVRNAPCRQN Hepatitis A Lab Results  Component Value Date   HAV NON-REACTIVE 05/21/2020   RPR and STI Lab Results  Component Value Date   LABRPR NON-REACTIVE 11/29/2019   LABRPR NON REAC 11/09/2012    STI Results GC CT  05/21/2020 Negative Negative  05/21/2020 Negative Negative    Assessment: [name] [name] here today to follow up for HIV PrEP. He continues to take it daily without any side  effects. He does not miss any doses. No concerns for anything today. He wishes to get PrEP treatment at his PCP's office to reduce the number of appointments he goes to. I will reach out to [name] [name] see if he is agreeable.  Plan: - HIV antibody today - Descovy x 3 months if HIV negative - Will reach out to PCP  Rambo Sarafian L. Kenyah Luba, PharmD, BCIDP, AAHIVP, CPP Clinical Pharmacist Practitioner Infectious Diseases [facility] 11/29/2020, 3:52 PM

## 2020-11-26 LAB — HIV ANTIBODY (ROUTINE TESTING W REFLEX): HIV 1&2 Ab, 4th Generation: NONREACTIVE

## 2020-11-28 ENCOUNTER — Encounter: Payer: Self-pay | Admitting: Internal Medicine

## 2020-11-28 NOTE — Telephone Encounter (Signed)
Please advise, pt needs review of lab work so results can be released to pt and Gastro. Thank you

## 2020-11-29 ENCOUNTER — Other Ambulatory Visit: Payer: Self-pay | Admitting: Pharmacist

## 2020-11-29 ENCOUNTER — Other Ambulatory Visit: Payer: Self-pay | Admitting: Internal Medicine

## 2020-11-29 DIAGNOSIS — Z79899 Other long term (current) drug therapy: Secondary | ICD-10-CM

## 2020-11-29 MED ORDER — DESCOVY 200-25 MG PO TABS: 1.0000 | ORAL_TABLET | Freq: Every day | ORAL | 2 refills | Status: DC

## 2020-11-29 MED ORDER — ROSUVASTATIN CALCIUM 20 MG PO TABS
20.0000 mg | ORAL_TABLET | Freq: Every day | ORAL | 3 refills | Status: DC
Start: 2020-11-29 — End: 2021-11-13

## 2020-12-03 ENCOUNTER — Ambulatory Visit (INDEPENDENT_AMBULATORY_CARE_PROVIDER_SITE_OTHER): Payer: BC Managed Care – PPO | Admitting: Psychology

## 2020-12-03 DIAGNOSIS — F411 Generalized anxiety disorder: Secondary | ICD-10-CM

## 2020-12-03 DIAGNOSIS — F331 Major depressive disorder, recurrent, moderate: Secondary | ICD-10-CM | POA: Diagnosis not present

## 2020-12-04 NOTE — Telephone Encounter (Signed)
Called and spoke with pt about lab results and Psychiatry referral. The psychiatry office that the referral was place is not taking new patients at this time. Pt was notified and he states that he called Dr. Toy Care office and first available appointment would be in December, which he declined. Pt also called Dr. Clovis Pu office and is waiting for a reply from them. Pt states he would like this office to continue to try to schedule an appointment as well.

## 2020-12-10 ENCOUNTER — Encounter: Payer: Self-pay | Admitting: Pharmacist

## 2020-12-16 ENCOUNTER — Other Ambulatory Visit: Payer: Self-pay | Admitting: Pharmacist

## 2020-12-16 DIAGNOSIS — Z79899 Other long term (current) drug therapy: Secondary | ICD-10-CM

## 2020-12-16 MED ORDER — EMTRICITABINE-TENOFOVIR DF 200-300 MG PO TABS: 1.0000 | ORAL_TABLET | Freq: Every day | ORAL | 2 refills | Status: DC

## 2020-12-16 NOTE — Progress Notes (Signed)
Patient's insurance will only pay for Truvada. Discussed with patient. Will send to pharmacy.

## 2020-12-17 ENCOUNTER — Ambulatory Visit (INDEPENDENT_AMBULATORY_CARE_PROVIDER_SITE_OTHER): Payer: BC Managed Care – PPO | Admitting: Psychology

## 2020-12-17 DIAGNOSIS — F330 Major depressive disorder, recurrent, mild: Secondary | ICD-10-CM

## 2020-12-17 DIAGNOSIS — F411 Generalized anxiety disorder: Secondary | ICD-10-CM

## 2020-12-19 ENCOUNTER — Ambulatory Visit (INDEPENDENT_AMBULATORY_CARE_PROVIDER_SITE_OTHER): Payer: BC Managed Care – PPO | Admitting: Internal Medicine

## 2020-12-19 DIAGNOSIS — E538 Deficiency of other specified B group vitamins: Secondary | ICD-10-CM | POA: Diagnosis not present

## 2020-12-19 MED ORDER — CYANOCOBALAMIN 1000 MCG/ML IJ SOLN
1000.0000 ug | Freq: Once | INTRAMUSCULAR | Status: AC
  Administered 2020-12-19: 1000 ug via INTRAMUSCULAR

## 2021-01-07 ENCOUNTER — Ambulatory Visit: Payer: BC Managed Care – PPO | Admitting: Psychology

## 2021-01-17 ENCOUNTER — Ambulatory Visit (INDEPENDENT_AMBULATORY_CARE_PROVIDER_SITE_OTHER): Payer: BC Managed Care – PPO | Admitting: Internal Medicine

## 2021-01-17 ENCOUNTER — Other Ambulatory Visit: Payer: Self-pay

## 2021-01-17 DIAGNOSIS — E538 Deficiency of other specified B group vitamins: Secondary | ICD-10-CM | POA: Diagnosis not present

## 2021-01-17 MED ORDER — CYANOCOBALAMIN 1000 MCG/ML IJ SOLN
1000.0000 ug | Freq: Once | INTRAMUSCULAR | Status: AC
  Administered 2021-01-17: 1000 ug via INTRAMUSCULAR

## 2021-01-21 ENCOUNTER — Other Ambulatory Visit: Payer: Self-pay

## 2021-01-21 MED ORDER — HUMIRA (2 PEN) 40 MG/0.4ML ~~LOC~~ AJKT
40.0000 mg | AUTO-INJECTOR | SUBCUTANEOUS | 1 refills | Status: DC
Start: 2021-01-21 — End: 2021-06-27

## 2021-01-23 ENCOUNTER — Other Ambulatory Visit: Payer: Self-pay | Admitting: Internal Medicine

## 2021-01-23 ENCOUNTER — Other Ambulatory Visit: Payer: Self-pay

## 2021-01-23 MED ORDER — OXYCODONE-ACETAMINOPHEN 10-325 MG PO TABS: 1.0000 | ORAL_TABLET | Freq: Four times a day (QID) | ORAL | 0 refills | Status: DC | PRN

## 2021-01-23 NOTE — Telephone Encounter (Signed)
Please address Sir, thank you.

## 2021-01-24 NOTE — Telephone Encounter (Signed)
[name] [name] informed and said he already took his Humira-takes it on Wednesday's. He said he doesn't have a fever with his Covid, just bad body aches. I encouraged him to call his PCP and let him know he has covid to see if he wants him to do any treatment.

## 2021-01-28 ENCOUNTER — Telehealth: Payer: Self-pay | Admitting: Internal Medicine

## 2021-01-28 ENCOUNTER — Encounter: Payer: Self-pay | Admitting: Internal Medicine

## 2021-01-28 NOTE — Telephone Encounter (Signed)
[name] [name] went to see Dr Chucky May (Psychiatrist) on 01/27/21 and she has changed his medicines as follows: stop the prozac, start Deplin L-methyfolate-CA 44m capsules, one daily (this is a medical food supplement) and he will start Trintellix 588mone daily tomorrow (01/29/21). I have updated his medicine list.  They did a DNA swab test to see what medicines are best for him.   Dr KaToy Careentioned about using spravato spray as treatment if the other changes don't help. They spray it in your face and you sit in a room so they can monitor you for 2 hours. You do this for 7 weeks. It is supposed to help the other medicines last longer.  He wanted Dr GeCarlean Purlo be aware of these changes.

## 2021-02-04 ENCOUNTER — Ambulatory Visit (INDEPENDENT_AMBULATORY_CARE_PROVIDER_SITE_OTHER): Payer: BC Managed Care – PPO | Admitting: Psychology

## 2021-02-04 DIAGNOSIS — F330 Major depressive disorder, recurrent, mild: Secondary | ICD-10-CM

## 2021-02-04 DIAGNOSIS — F411 Generalized anxiety disorder: Secondary | ICD-10-CM | POA: Diagnosis not present

## 2021-02-06 ENCOUNTER — Ambulatory Visit: Payer: BC Managed Care – PPO | Admitting: Gastroenterology

## 2021-02-14 ENCOUNTER — Ambulatory Visit (INDEPENDENT_AMBULATORY_CARE_PROVIDER_SITE_OTHER): Payer: BC Managed Care – PPO | Admitting: Internal Medicine

## 2021-02-14 DIAGNOSIS — E538 Deficiency of other specified B group vitamins: Secondary | ICD-10-CM | POA: Diagnosis not present

## 2021-02-14 MED ORDER — CYANOCOBALAMIN 1000 MCG/ML IJ SOLN
1000.0000 ug | Freq: Once | INTRAMUSCULAR | Status: AC
Start: 2021-02-14 — End: 2021-02-14
  Administered 2021-02-14: 1000 ug via INTRAMUSCULAR

## 2021-02-21 ENCOUNTER — Telehealth: Payer: Self-pay | Admitting: Internal Medicine

## 2021-02-21 NOTE — Telephone Encounter (Signed)
I told [name] [name] we don't have those type supplies here. By the time I called him he had checked multiple places and ended up ordering from [employer]. They are a different brand but hopefully will work. This is for his partner, not [name].

## 2021-02-21 NOTE — Telephone Encounter (Signed)
Inbound call from patient requesting a call back to know if Dr. Carlean Purl have any adhesive wipes for ostomy. Would like to speak with PJ

## 2021-02-25 ENCOUNTER — Ambulatory Visit: Payer: BC Managed Care – PPO | Admitting: Gastroenterology

## 2021-02-26 ENCOUNTER — Ambulatory Visit: Payer: BC Managed Care – PPO | Admitting: Psychology

## 2021-03-17 ENCOUNTER — Other Ambulatory Visit: Payer: Self-pay | Admitting: Pharmacist

## 2021-03-17 ENCOUNTER — Ambulatory Visit: Payer: BC Managed Care – PPO | Admitting: Gastroenterology

## 2021-03-17 ENCOUNTER — Other Ambulatory Visit: Payer: Self-pay

## 2021-03-17 ENCOUNTER — Encounter: Payer: Self-pay | Admitting: Pharmacist

## 2021-03-17 DIAGNOSIS — Z79899 Other long term (current) drug therapy: Secondary | ICD-10-CM

## 2021-03-17 MED ORDER — EMTRICITABINE-TENOFOVIR DF 200-300 MG PO TABS: 1.0000 | ORAL_TABLET | Freq: Every day | ORAL | 2 refills | Status: DC

## 2021-03-17 NOTE — Progress Notes (Signed)
Refilling Rx. Patient has one partner who is HIV positive on treatment. Will follow up every 6 months.

## 2021-03-19 ENCOUNTER — Other Ambulatory Visit: Payer: Self-pay

## 2021-03-19 MED ORDER — OXYCODONE-ACETAMINOPHEN 10-325 MG PO TABS: 1.0000 | ORAL_TABLET | Freq: Four times a day (QID) | ORAL | 0 refills | Status: DC | PRN

## 2021-03-19 NOTE — Telephone Encounter (Signed)
OK   Did not see that  I refilled the rx

## 2021-03-19 NOTE — Telephone Encounter (Signed)
Nevin needs to schedule an appointment for any further narcotic prescriptions

## 2021-03-19 NOTE — Telephone Encounter (Signed)
I left Kemo a [employer] that this has been done.

## 2021-03-19 NOTE — Telephone Encounter (Signed)
He has an appointment with Dr Pilar Grammes Oct 4th for abdominal wall injection. Did you mean appointment in general or one with you Sir?

## 2021-03-21 ENCOUNTER — Ambulatory Visit (INDEPENDENT_AMBULATORY_CARE_PROVIDER_SITE_OTHER): Payer: BC Managed Care – PPO | Admitting: Internal Medicine

## 2021-03-21 DIAGNOSIS — E538 Deficiency of other specified B group vitamins: Secondary | ICD-10-CM

## 2021-03-21 MED ORDER — CYANOCOBALAMIN 1000 MCG/ML IJ SOLN
1000.0000 ug | Freq: Once | INTRAMUSCULAR | Status: AC
  Administered 2021-03-21: 1000 ug via INTRAMUSCULAR

## 2021-03-25 ENCOUNTER — Ambulatory Visit: Payer: BC Managed Care – PPO | Admitting: Gastroenterology

## 2021-04-18 ENCOUNTER — Ambulatory Visit (INDEPENDENT_AMBULATORY_CARE_PROVIDER_SITE_OTHER): Payer: BC Managed Care – PPO | Admitting: Internal Medicine

## 2021-04-18 DIAGNOSIS — E538 Deficiency of other specified B group vitamins: Secondary | ICD-10-CM | POA: Diagnosis not present

## 2021-04-18 MED ORDER — CYANOCOBALAMIN 1000 MCG/ML IJ SOLN
1000.0000 ug | Freq: Once | INTRAMUSCULAR | Status: AC
  Administered 2021-04-18: 1000 ug via INTRAMUSCULAR

## 2021-05-09 ENCOUNTER — Other Ambulatory Visit: Payer: Self-pay | Admitting: Internal Medicine

## 2021-05-09 MED ORDER — OXYCODONE-ACETAMINOPHEN 10-325 MG PO TABS: 1.0000 | ORAL_TABLET | Freq: Four times a day (QID) | ORAL | 0 refills | Status: DC | PRN

## 2021-05-09 NOTE — Telephone Encounter (Signed)
I refilled it He needs to make a next available appointment for f/u

## 2021-05-09 NOTE — Telephone Encounter (Signed)
Please see his note, one pill left.

## 2021-05-09 NOTE — Telephone Encounter (Signed)
I left [name] [name] a detailed message that rx has been sent and for him to call and get an appointment.

## 2021-05-13 ENCOUNTER — Other Ambulatory Visit: Payer: Self-pay | Admitting: Internal Medicine

## 2021-05-20 ENCOUNTER — Ambulatory Visit (INDEPENDENT_AMBULATORY_CARE_PROVIDER_SITE_OTHER): Payer: BC Managed Care – PPO | Admitting: Internal Medicine

## 2021-05-20 DIAGNOSIS — E538 Deficiency of other specified B group vitamins: Secondary | ICD-10-CM

## 2021-05-20 MED ORDER — CYANOCOBALAMIN 1000 MCG/ML IJ SOLN
1000.0000 ug | INTRAMUSCULAR | Status: DC
Start: 2021-05-20 — End: 2022-04-02
  Administered 2021-05-20 – 2022-02-09 (×7): 1000 ug via INTRAMUSCULAR

## 2021-06-05 ENCOUNTER — Other Ambulatory Visit: Payer: Self-pay

## 2021-06-05 ENCOUNTER — Ambulatory Visit (INDEPENDENT_AMBULATORY_CARE_PROVIDER_SITE_OTHER): Payer: BC Managed Care – PPO | Admitting: Pharmacist

## 2021-06-05 DIAGNOSIS — Z113 Encounter for screening for infections with a predominantly sexual mode of transmission: Secondary | ICD-10-CM

## 2021-06-05 DIAGNOSIS — E785 Hyperlipidemia, unspecified: Secondary | ICD-10-CM

## 2021-06-05 DIAGNOSIS — R739 Hyperglycemia, unspecified: Secondary | ICD-10-CM | POA: Diagnosis not present

## 2021-06-05 DIAGNOSIS — Z79899 Other long term (current) drug therapy: Secondary | ICD-10-CM

## 2021-06-05 DIAGNOSIS — Z0001 Encounter for general adult medical examination with abnormal findings: Secondary | ICD-10-CM

## 2021-06-05 NOTE — Progress Notes (Signed)
Date:  06/05/2021   HPI: [name] [name] is a 45 y.o. male who presents to the [place] clinic for HIV PrEP follow-up.  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 12/20/2017   Perianal dermatitis 10/26/2017   Encounter for well adult exam with abnormal findings 05/10/2017   High risk sexual behavior 62/22/9798   Folliculitis 92/04/9416   Chronic tonsillitis 10/14/2015   Ganglion cyst of flexor tendon sheath 11/02/2014   Chronic posterior anal fissure 02/15/2014   Tinea pedis 06/27/2013   Long-term use of immunosuppressant medication - Humira 10/14/2010   Obesity, Class II, BMI 35-39.9 07/20/2007   Anxiety with depression 07/20/2007   Insomnia 07/20/2007   OSA (obstructive sleep apnea) 07/20/2007   Essential hypertension, benign 07/20/2007   CROHN'S DISEASE, LARGE AND SMALL INTESTINES 07/20/2007    Patient's Medications  New Prescriptions   No medications on file  Previous Medications   ACETAMINOPHEN (TYLENOL) 325 MG TABLET    Take 650 mg by mouth every 6 (six) hours as needed for moderate pain.   ADALIMUMAB (HUMIRA PEN) 40 MG/0.4ML PNKT    Inject 40 mg into the skin once a week.   ALPRAZOLAM (XANAX XR) 3 MG 24 HR TABLET    TAKE 1 TABLET BY MOUTH EVERY MORNING.   ALPRAZOLAM (XANAX) 2 MG TABLET    TAKE 1/2 TAB BY MOUTH TWICE DAILY AS NEEDED.   CYANOCOBALAMIN (,VITAMIN B-12,) 1000 MCG/ML INJECTION    INJECT 1 ML (1,000 MCG TOTAL) INTO THE MUSCLE EVERY 30 (THIRTY) DAYS.   DIPHENOXYLATE-ATROPINE (LOMOTIL) 2.5-0.025 MG TABLET    TAKE 1 TABLET BY MOUTH 4 TIMES A DAY AS NEEDED FOR  DIARRHEA/LOOSE STOOLS   EMTRICITABINE-TENOFOVIR (TRUVADA) 200-300 MG TABLET    Take 1 tablet by mouth daily.   FERROUS SULFATE 325 (65 FE) MG EC TABLET    Take 325 mg by mouth every other day.    L-METHYLFOLATE-ALGAE (DEPLIN 15 PO)    Take 1 capsule by mouth daily. A medical food supplement   LISINOPRIL-HYDROCHLOROTHIAZIDE (ZESTORETIC) 20-25 MG TABLET    TAKE 1 TABLET BY MOUTH EVERY DAY   NYSTATIN-TRIAMCINOLONE OINTMENT (MYCOLOG)    Apply 1 application topically 2 (two) times daily.   ONDANSETRON (ZOFRAN) 4 MG TABLET    TAKE 1 TAB BY MOUTH EVERY 6 HOURS   OXYCODONE-ACETAMINOPHEN (PERCOCET) 10-325 MG TABLET    Take 1 tablet by mouth every 6 (six) hours as needed for pain.   ROSUVASTATIN (CRESTOR) 20 MG TABLET    Take 1 tablet (20 mg total) by mouth daily.   VILAZODONE HCL (VIIBRYD) 40 MG TABS    Take 1 tablet by mouth daily.   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-colonic resection 12/20/2017   Crohn's disease ([place]) followed by dr Carlean Purl   small and large intestines--  hx bowel resection 02/ 2009--  currently treated w/ humira   Essential hypertension, benign    History of CMV 10/14/2010   resolved w/ antiviral therapy   History of kidney stones    History of resection of small bowel    Hyperlipidemia    Major depressive disorder    OSA (obstructive sleep apnea)    01-05-2018 per pt resolved since tonsils removed    Ventral hernia    Vitamin D deficiency     Social History: Social History   Socioeconomic History   Marital status: Divorced    Spouse name: Not on file   Number of children: 2   Years of education: 14   Highest education level:  Not on file  Occupational History   Occupation: Mortgage    Employer: [employer]  Tobacco Use   Smoking status: Never   Smokeless tobacco: Never  Vaping Use   Vaping Use: Never used  Substance and Sexual Activity   Alcohol use: Yes    Alcohol/week: 3.0 standard drinks    Types: 3 Shots of liquor per week   Drug use: No   Sexual activity: Yes    Partners: Male    Comment: Truvada protection  Other Topics Concern   Not on file  Social History Narrative   HSG, [place] - 2 years. Married '99 - 22yr/divorced. 2 sons - '98, '00 split custody. 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 No history of physical or sexual abuse.    [employer]  Social Determinants of Health   Financial Resource Strain: Not on file  Food Insecurity: Not on file  Transportation Needs: Not on file  Physical Activity: Not on file  Stress: Not on file  Social Connections: Not on file    No flowsheet data found.  Labs:  SCr: Lab Results  Component Value Date   CREATININE 1.10 11/21/2020   CREATININE 0.96 10/02/2020   CREATININE 0.90 03/14/2020   CREATININE 1.20 02/13/2020   CREATININE 0.98 11/29/2019   HIV Lab Results  Component Value Date   HIV NON-REACTIVE 11/25/2020   HIV NON-REACTIVE 05/21/2020   HIV NON-REACTIVE 11/29/2019   HIV NON-REACTIVE 07/07/2018   HIV NON-REACTIVE 04/02/2017   Hepatitis B Lab Results  Component Value Date   HEPBSAB NON-REACTIVE 12/28/2019   HEPBSAG NON-REACTIVE 12/28/2019   HEPBCAB NON-REACTIVE 12/28/2019   Hepatitis C No results found for: HEPCAB, HCVRNAPCRQN Hepatitis A Lab Results  Component Value Date   HAV NON-REACTIVE 05/21/2020   RPR and STI Lab Results  Component Value Date   LABRPR NON-REACTIVE 11/29/2019   LABRPR NON REAC 11/09/2012    STI Results GC CT  05/21/2020 Negative Negative  05/21/2020 Negative Negative    Assessment: DJsiahcomes in today for his 6 month PrEP follow up. He switched  from Descovy to Truvada several months ago because of his insurance. He is tolerating Truvada well with no issues or side effects. He will have no changes to his insurance in the new year.   He would prefer not to come into the office for appointments as he has a work from home job and needs to be at his computer at all times. I will coordinate a lab appointment only when he is due for his refill and send in his prescriptions after a telephone/evisit. He may get  his labs coordinated through his GI doctor or PCP as well. Updated medication list today.  He is still with his HIV+ partner. No new partners. He is taking care of his husband who now has an ostomy. He declines STI testing today but asks for general labs to be drawn for his annual appointment. Will send in 6 more months of Truvada if he remains HIV negative.   Plan: - HIV antibody + routine lab work for annual visit - Truvada x 6 months if HIV negative - Reach out when refill  is needed to schedule lab appointment  Khamille Beynon L. Iretha Kirley, PharmD, BCIDP, AAHIVP, CPP Clinical Pharmacist Practitioner [facility] 06/05/2021, 9:05 AM

## 2021-06-06 LAB — CBC WITH DIFFERENTIAL/PLATELET
Absolute Monocytes: 722 cells/uL (ref 200–950)
Basophils Absolute: 82 cells/uL (ref 0–200)
Basophils Relative: 1 %
Eosinophils Absolute: 148 cells/uL (ref 15–500)
Eosinophils Relative: 1.8 %
HCT: 45.2 % (ref 38.5–50.0)
Hemoglobin: 15.6 g/dL (ref 13.2–17.1)
Lymphs Abs: 2665 cells/uL (ref 850–3900)
MCH: 33.6 pg — ABNORMAL HIGH (ref 27.0–33.0)
MCHC: 34.5 g/dL (ref 32.0–36.0)
MCV: 97.4 fL (ref 80.0–100.0)
MPV: 11.8 fL (ref 7.5–12.5)
Monocytes Relative: 8.8 %
Neutro Abs: 4584 cells/uL (ref 1500–7800)
Neutrophils Relative %: 55.9 %
Platelets: 241 10*3/uL (ref 140–400)
RBC: 4.64 10*6/uL (ref 4.20–5.80)
RDW: 13.2 % (ref 11.0–15.0)
Total Lymphocyte: 32.5 %
WBC: 8.2 10*3/uL (ref 3.8–10.8)

## 2021-06-06 LAB — COMPREHENSIVE METABOLIC PANEL
AG Ratio: 1.5 (calc) (ref 1.0–2.5)
ALT: 22 U/L (ref 9–46)
AST: 19 U/L (ref 10–40)
Albumin: 4.5 g/dL (ref 3.6–5.1)
Alkaline phosphatase (APISO): 69 U/L (ref 36–130)
BUN: 14 mg/dL (ref 7–25)
CO2: 26 mmol/L (ref 20–32)
Calcium: 9.6 mg/dL (ref 8.6–10.3)
Chloride: 102 mmol/L (ref 98–110)
Creat: 1.21 mg/dL (ref 0.60–1.29)
Globulin: 3 g/dL (calc) (ref 1.9–3.7)
Glucose, Bld: 99 mg/dL (ref 65–99)
Potassium: 4.5 mmol/L (ref 3.5–5.3)
Sodium: 135 mmol/L (ref 135–146)
Total Bilirubin: 0.4 mg/dL (ref 0.2–1.2)
Total Protein: 7.5 g/dL (ref 6.1–8.1)

## 2021-06-06 LAB — LIPID PANEL
Cholesterol: 192 mg/dL (ref <200)
HDL: 59 mg/dL (ref >=40)
LDL Cholesterol (Calc): 113 mg/dL (calc) — ABNORMAL HIGH
Non-HDL Cholesterol (Calc): 133 mg/dL (calc) — ABNORMAL HIGH (ref <130)
Total CHOL/HDL Ratio: 3.3 (calc) (ref <5.0)
Triglycerides: 96 mg/dL (ref <150)

## 2021-06-06 LAB — VITAMIN B12: Vitamin B-12: 663 pg/mL (ref 200–1100)

## 2021-06-06 LAB — VITAMIN D 25 HYDROXY (VIT D DEFICIENCY, FRACTURES): Vit D, 25-Hydroxy: 40 ng/mL (ref 30–100)

## 2021-06-06 LAB — TSH: TSH: 1.14 mIU/L (ref 0.40–4.50)

## 2021-06-06 LAB — HEMOGLOBIN A1C
Hgb A1c MFr Bld: 5.6 % of total Hgb (ref <5.7)
Mean Plasma Glucose: 114 mg/dL
eAG (mmol/L): 6.3 mmol/L

## 2021-06-06 LAB — HIV ANTIBODY (ROUTINE TESTING W REFLEX): HIV 1&2 Ab, 4th Generation: NONREACTIVE

## 2021-06-06 LAB — RPR: RPR Ser Ql: NONREACTIVE

## 2021-06-09 ENCOUNTER — Other Ambulatory Visit: Payer: Self-pay | Admitting: Pharmacist

## 2021-06-09 DIAGNOSIS — Z79899 Other long term (current) drug therapy: Secondary | ICD-10-CM

## 2021-06-09 MED ORDER — EMTRICITABINE-TENOFOVIR DF 200-300 MG PO TABS: 1.0000 | ORAL_TABLET | Freq: Every day | ORAL | 5 refills | Status: DC

## 2021-06-17 ENCOUNTER — Ambulatory Visit (INDEPENDENT_AMBULATORY_CARE_PROVIDER_SITE_OTHER): Payer: BC Managed Care – PPO | Admitting: Internal Medicine

## 2021-06-17 DIAGNOSIS — E538 Deficiency of other specified B group vitamins: Secondary | ICD-10-CM | POA: Diagnosis not present

## 2021-06-17 MED ORDER — CYANOCOBALAMIN 1000 MCG/ML IJ SOLN
1000.0000 ug | Freq: Once | INTRAMUSCULAR | Status: AC
Start: 2021-06-17 — End: 2021-06-17
  Administered 2021-06-17: 08:00:00 1000 ug via INTRAMUSCULAR

## 2021-06-18 ENCOUNTER — Telehealth: Payer: Self-pay | Admitting: Internal Medicine

## 2021-06-18 ENCOUNTER — Encounter: Payer: Self-pay | Admitting: Internal Medicine

## 2021-06-18 NOTE — Telephone Encounter (Signed)
Dr. Carlean Purl Pt.  Patient called in and states he was given his B12 shot yesterday and he tested positive for Covid-19 this AM .  Pt states that he feels very weak and run down today and that he has a fever of 99.7.  Pl states that he is supposed to inject his Humira this evening. Pt is questioning if he should wait on the Humira Injection in regard to  currently being positive for Covid. Please Advise as DOD

## 2021-06-18 NOTE — Telephone Encounter (Signed)
Pt made aware of Dr. Lorenso Courier recommendations.  Pt verbalized understanding with all questions answered.

## 2021-06-18 NOTE — Telephone Encounter (Signed)
Pt made aware of Dr. Lorenso Courier recommendations documented in a phone note. Pt verbalized understanding with all questions answered.

## 2021-06-18 NOTE — Telephone Encounter (Signed)
Patient called states he was given his B12 shot yesterday and he tested positive for Covid-19 also wants to know what he can or can not do as far as his Humira goes with having Covid.

## 2021-06-22 DIAGNOSIS — U071 COVID-19: Secondary | ICD-10-CM

## 2021-06-22 HISTORY — DX: COVID-19: U07.1

## 2021-06-23 ENCOUNTER — Other Ambulatory Visit: Payer: Self-pay

## 2021-06-24 ENCOUNTER — Telehealth: Payer: Self-pay | Admitting: Internal Medicine

## 2021-06-24 ENCOUNTER — Telehealth (INDEPENDENT_AMBULATORY_CARE_PROVIDER_SITE_OTHER): Payer: BC Managed Care – PPO | Admitting: Internal Medicine

## 2021-06-24 ENCOUNTER — Encounter: Payer: Self-pay | Admitting: Internal Medicine

## 2021-06-24 ENCOUNTER — Emergency Department (HOSPITAL_COMMUNITY)
Admission: EM | Admit: 2021-06-24 | Discharge: 2021-06-24 | Discharge status: Against Medical Advice | Payer: BC Managed Care – PPO | Source: Home / Self Care

## 2021-06-24 DIAGNOSIS — U071 COVID-19: Secondary | ICD-10-CM

## 2021-06-24 MED ORDER — HYDROCODONE BIT-HOMATROP MBR 5-1.5 MG/5ML PO SOLN: 5.0000 mL | Freq: Three times a day (TID) | ORAL | 0 refills | Status: DC | PRN

## 2021-06-24 MED ORDER — MOLNUPIRAVIR EUA 200MG CAPSULE: 4.0000 | ORAL_CAPSULE | Freq: Two times a day (BID) | ORAL | 0 refills | Status: AC

## 2021-06-24 MED ORDER — ALBUTEROL SULFATE HFA 108 (90 BASE) MCG/ACT IN AERS: 2.0000 | INHALATION_SPRAY | Freq: Four times a day (QID) | RESPIRATORY_TRACT | 1 refills | Status: DC | PRN

## 2021-06-24 NOTE — Telephone Encounter (Signed)
Connected to [facility] 1.2.2023.  Caller states, he states tested positive for Covid-19 last Wednesday. He states is currently having congestion, headache, body aches, and either is sweating or freezing. Pt has been holding his humira per his doctor, but was instructed to hold the medication until he feels better. Pt is requesting an inhaler as he occasionally has some breathing difficulty. Pt has taken OTC pain medications but it has not helped. No fevers. Pt is fully vaccinated against COVID. Denies any other symptoms at this time.   Advised to see HCP within 4 hours. Appt scheduled

## 2021-06-24 NOTE — Assessment & Plan Note (Addendum)
New Pt has Crohn's - on Rx Given Molnupiravir Rx Hycodan syr  Take zinc 50 mg a day for 1 week, vitamin C 1000 mg daily for 1 week, vitamin D2 50,000 units weekly for 2 months (unless  taking vitamin D daily already), an antioxidant Quercetin 500 mg twice a day for 1 week (if you can get it quick enough). Take Allegra or Benadryl.  Maintain good oral hydration and take Tylenol for high fever.  Call if problems. Isolate for 5 days, then wear a mask for 5 days per CDC.

## 2021-06-24 NOTE — Progress Notes (Signed)
Virtual Visit via Video Note  I connected with [name] [name] on 06/24/21 at  2:00 PM EST by a video enabled telemedicine application and verified that I am speaking with the correct person using two identifiers.   I discussed the limitations of evaluation and management by telemedicine and the availability of in person appointments. The patient expressed understanding and agreed to proceed.  I was located at our [facility] office. The patient was at home. There was no one else present in the visit.  No chief complaint on file.    History of Present Illness:  COVID x 5 d: sick w/chills, sleepy, ST, cough, HA No mucus or nasal d/c. No SOB  Review of Systems  Constitutional:  Positive for chills. Negative for fever.  HENT:  Positive for congestion, sinus pain and sore throat.   Respiratory:  Positive for cough. Negative for sputum production, shortness of breath and wheezing.   Musculoskeletal:  Positive for myalgias.  Neurological:  Positive for weakness and headaches.  Endo/Heme/Allergies:  Does not bruise/bleed easily.    Observations/Objective: The patient appears to be in no acute distress  Assessment and Plan:  Problem List Items Addressed This Visit     COVID-19    New Pt has Crohn's - on Rx Given Molnupiravir Rx Hycodan syr  Take zinc 50 mg a day for 1 week, vitamin C 1000 mg daily for 1 week, vitamin D2 50,000 units weekly for 2 months (unless  taking vitamin D daily already), an antioxidant Quercetin 500 mg twice a day for 1 week (if you can get it quick enough). Take Allegra or Benadryl.  Maintain good oral hydration and take Tylenol for high fever.  Call if problems. Isolate for 5 days, then wear a mask for 5 days per CDC.       Relevant Medications   molnupiravir EUA (LAGEVRIO) 200 mg CAPS capsule     Meds ordered this encounter  Medications   molnupiravir EUA (LAGEVRIO) 200 mg CAPS capsule    Sig: Take 4 capsules (800 mg total) by mouth 2 (two)  times daily for 5 days.    Dispense:  40 capsule    Refill:  0   HYDROcodone bit-homatropine (HYCODAN) 5-1.5 MG/5ML syrup    Sig: Take 5 mLs by mouth every 8 (eight) hours as needed for cough.    Dispense:  240 mL    Refill:  0     Follow Up Instructions:    I discussed the assessment and treatment plan with the patient. The patient was provided an opportunity to ask questions and all were answered. The patient agreed with the plan and demonstrated an understanding of the instructions.   The patient was advised to call back or seek an in-person evaluation if the symptoms worsen or if the condition fails to improve as anticipated.  I provided face-to-face time during this encounter. We were at different locations.   Walker Kehr, MD

## 2021-06-27 ENCOUNTER — Telehealth: Payer: Self-pay

## 2021-06-27 ENCOUNTER — Other Ambulatory Visit: Payer: Self-pay | Admitting: Gastroenterology

## 2021-06-27 NOTE — Telephone Encounter (Signed)
Received an RX Request for Humira Injections It seems that this pt has no showed or canceled for recent abdominal wall injections for 03/25/2021; 03/17/2021; 02/25/2021; 02/06/2021 Pt was scheduled for an OV for 07/17/2021 at 1:30 with Dr. Carlean Purl for 6 month follow up and prescription refill:   Left message for pt to call back: Prescription sent in for 1 Month for Humira:

## 2021-06-30 NOTE — Telephone Encounter (Signed)
Pt was notified that we received an RX Request for the  Humira Injections It seems that this pt has no showed or canceled for recent abdominal wall injections for 03/25/2021; 03/17/2021; 02/25/2021; 02/06/2021 Pt was scheduled for an OV for 07/17/2021 at 1:30 with Dr. Carlean Purl for 6 month follow up and prescription refill:  Pt made aware Prescription sent in for 1 Month for Humira:  Pt verbalized understanding with all questions answered.

## 2021-07-08 ENCOUNTER — Telehealth: Payer: Self-pay | Admitting: Internal Medicine

## 2021-07-08 NOTE — Telephone Encounter (Signed)
Pt states they would like to transfer care to [facility] due to his father already receiving care in the [place] office.

## 2021-07-08 NOTE — Telephone Encounter (Signed)
Please see message and advise 

## 2021-07-08 NOTE — Telephone Encounter (Signed)
Ok with me, thanks.

## 2021-07-09 NOTE — Telephone Encounter (Signed)
Please see Samantha's message.

## 2021-07-09 NOTE — Telephone Encounter (Signed)
Patient has been scheduled

## 2021-07-14 ENCOUNTER — Ambulatory Visit (INDEPENDENT_AMBULATORY_CARE_PROVIDER_SITE_OTHER): Payer: BC Managed Care – PPO | Admitting: Internal Medicine

## 2021-07-14 DIAGNOSIS — E538 Deficiency of other specified B group vitamins: Secondary | ICD-10-CM | POA: Diagnosis not present

## 2021-07-17 ENCOUNTER — Encounter: Payer: Self-pay | Admitting: Internal Medicine

## 2021-07-17 ENCOUNTER — Ambulatory Visit: Payer: BC Managed Care – PPO | Admitting: Internal Medicine

## 2021-07-17 VITALS — BP 94/68 | HR 106 | Ht 67.25 in | Wt 225.2 lb

## 2021-07-17 DIAGNOSIS — K5080 Crohn's disease of both small and large intestine without complications: Secondary | ICD-10-CM

## 2021-07-17 DIAGNOSIS — K9089 Other intestinal malabsorption: Secondary | ICD-10-CM | POA: Diagnosis not present

## 2021-07-17 DIAGNOSIS — Z7960 Long term (current) use of unspecified immunomodulators and immunosuppressants: Secondary | ICD-10-CM | POA: Diagnosis not present

## 2021-07-17 DIAGNOSIS — E538 Deficiency of other specified B group vitamins: Secondary | ICD-10-CM | POA: Diagnosis not present

## 2021-07-17 MED ORDER — HUMIRA (2 PEN) 40 MG/0.4ML ~~LOC~~ AJKT: AUTO-INJECTOR | SUBCUTANEOUS | 3 refills | Status: DC

## 2021-07-17 NOTE — Patient Instructions (Signed)
Your provider has requested that you go to the basement level for lab work before leaving today. Press "B" on the elevator. The lab is located at the first door on the left as you exit the elevator.  We have sent the following medications to your pharmacy for you to pick up at your convenience: Humira  I appreciate the opportunity to care for you. Silvano Rusk, MD, [facility]

## 2021-07-17 NOTE — Progress Notes (Signed)
[name] [name] 46 y.o. [birth year] [id number]  Assessment & Plan:   Encounter Diagnoses  Name Primary?   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  We will continue Humira weekly and Lomotil as needed.  In the past he has taken cholestyramine but he seems to be doing okay without that at this time.  He does have some chronic right upper quadrant mid quadrant pain that I think has been musculoskeletal.  He decided not to pursue abdominal wall injection with [name]. Bryan [name].  He tolerates this and we will observe it.  He will continue his B12 therapy as well.  QuantiFERON testing today.  Orders Placed This Encounter  Procedures   QuantiFERON-TB Gold Plus   Return in 1 year sooner as needed.    Subjective:   Chief Complaint: Follow-up of Crohn's disease  HPI [name] [name] returns today for follow-up.  He is not having any problems with his Crohn's disease.  Sometimes he will have some fecal seepage after defecation but otherwise no significant perianal complaints.  No significant diarrhea reported he manages with diphenoxylate and atropine, he continues on Humira and would like a refill.  He is on weekly Humira.  He had COVID in early January this month and has recovered.  He and his husband both had it.  Recent laboratory testing through ID with good vitamin D level at 40, normal electrolytes and chemistries, improved lipid panel, mild elevation in LDL but triglyceride and HDL in good ranges, normal CBC. No Known Allergies Current Meds  Medication Sig   acetaminophen (TYLENOL) 325 MG tablet Take 650 mg by mouth every 6 (six) hours as needed for moderate pain.   albuterol (VENTOLIN HFA) 108 (90 Base) MCG/ACT inhaler Inhale 2 puffs into the lungs every 6 (six)  hours as needed for wheezing or shortness of breath.   ALPRAZolam (XANAX XR) 3 MG 24 hr tablet TAKE 1 TABLET BY MOUTH EVERY MORNING.   alprazolam (XANAX) 2 MG tablet TAKE 1/2 TAB BY MOUTH TWICE DAILY AS NEEDED.   cyanocobalamin (,VITAMIN B-12,) 1000 MCG/ML injection INJECT 1 ML (1,000 MCG TOTAL) INTO THE MUSCLE EVERY 30 (THIRTY) DAYS.   Dextromethorphan-buPROPion ER (AUVELITY) 45-105 MG TBCR Take 1 tablet by mouth in the morning and at bedtime.   diphenoxylate-atropine (LOMOTIL) 2.5-0.025 MG tablet TAKE 1 TABLET BY MOUTH 4 TIMES A DAY AS NEEDED FOR DIARRHEA/LOOSE STOOLS   emtricitabine-tenofovir (TRUVADA) 200-300 MG tablet Take 1 tablet by mouth daily.   HUMIRA PEN 40 MG/0.4ML PNKT INJECT 40 MG UNDER THE SKIN ONCE A WEEK   L-Methylfolate-Algae (DEPLIN 15 PO) Take 1 capsule by mouth daily. A medical food supplement   lisinopril-hydrochlorothiazide (ZESTORETIC) 20-25 MG tablet TAKE 1 TABLET BY MOUTH EVERY DAY   nystatin-triamcinolone ointment (MYCOLOG) Apply 1 application topically 2 (two) times daily.   ondansetron (ZOFRAN) 4 MG tablet TAKE 1 TAB BY MOUTH EVERY 6 HOURS (Patient taking differently: Take 4 mg by mouth every 6 (six) hours as needed for nausea or vomiting.)   rosuvastatin (CRESTOR) 20 MG tablet Take 1 tablet (20 mg total) by mouth daily.   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   Crohn's disease ([place]) followed by [name] [name]   small and large intestines--  hx bowel resection 02/ 2009--  currently treated w/ humira   Essential hypertension, benign    History of CMV 10/14/2010   resolved w/ antiviral  therapy   History of kidney stones    History of resection of small bowel    Hyperlipidemia    Major depressive disorder    OSA (obstructive sleep apnea)    01-05-2018 per pt resolved since tonsils removed    Ventral hernia    Vitamin D deficiency    Past Surgical History:  Procedure Laterality Date   ANAL FISTULOTOMY N/A 01/07/2018   Procedure: ANAL FISTULOTOMY;  Surgeon: [name] Ruff, MD;  Location: [facility];  Service: General;  Laterality: N/A;   COLONOSCOPY  last one 04-2017   [name] [name]   EXPLORATORY LAPAROTOMY W/ ILEOSECECTOMY W/ PRIMARY ANASTAMOSIS  08-05-2007    [name] gross  [facility]   crohn's, sbo, perferation's   GANGLION CYST EXCISION Right 2016   wrist   HYPOSPADIAS CORRECTION  CHILD   SPHINCTEROTOMY N/A 01/07/2018   Procedure: CHEMICAL SPHINCTEROTOMY (BOTOX);  Surgeon: [name] Ruff, MD;  Location: [facility];  Service: General;  Laterality: N/A;   TONSILLECTOMY Bilateral 09/30/2015   Procedure: BILATERAL TONSILLECTOMY;  Surgeon: [name] Gala, MD;  Location: [place];  Service: ENT;  Laterality: Bilateral;   UPPER GASTROINTESTINAL ENDOSCOPY  10/15/2010   w/biopsy, mild gastritis and duodenitis   Social History   Social History Narrative   HSG, [place] - 2 years. Married '99 - 40yr/divorced. 2 sons - '98, '00 split custody. 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 No history of physical or sexual abuse.    [name] [name]   family history includes Allergies in his mother and sister; Asthma in his sister; Cancer in his mother; Gout in his father; Heart disease in his father; Hyperlipidemia in his father; Hypertension in his father; Obesity in his mother; Sleep apnea in his father; Stroke in his maternal grandfather.   Review of Systems As above  Objective:   Physical Exam BP 94/68    Pulse (!) 106    Ht 5' 7.25" (1.708 m)    Wt 225 lb 4 oz (102.2 kg)    SpO2 96%    BMI 35.02 kg/m  Obese well-developed  well-nourished white man no acute distress Lungs are clear Heart sounds are normal S1-S2 no rubs or gallops The abdomen is obese soft, there is a ventral scar, ventral hernia.  Soft and nontender.  Bowel sounds are present.  There is chronic right upper and mid quadrant tenderness as before.  No rebound. Inspection of the perianal area shows some mild perianal erythema, there is a sentinel pile posteriorly as in the past, and just in the left lateral posterior close to the 6 o'clock position is a small pinpoint skin defect without any discharge or fluctuance.  Site of prior fistula I suspect.

## 2021-07-23 ENCOUNTER — Other Ambulatory Visit: Payer: Self-pay | Admitting: Internal Medicine

## 2021-07-31 ENCOUNTER — Encounter: Payer: Self-pay | Admitting: Internal Medicine

## 2021-07-31 NOTE — Telephone Encounter (Signed)
Contacted pt to clarify what medication he is requesting a refill for.  Pt stated that he is requesting a refill for the percocet 10/325: Pt stated that he has been taking tylenol but feels that he is needing  the percocet  Pt notified that Dr. Carlean Purl is out of the office so it will be next week before he responds: Pt verbalized understanding with all questions answered.

## 2021-08-06 NOTE — Telephone Encounter (Signed)
Pt scheduled for an Office Visit with Dr. Bryan Lemma on 08/18/2021 at 11:20   Pt made aware:    Address Provided Pt verbalized understanding with all questions answered

## 2021-08-06 NOTE — Telephone Encounter (Signed)
Pt scheduled for an Office Visit with Dr. Bryan Lemma on 08/18/2021 at 11:20  Left message for pt to call back

## 2021-08-15 ENCOUNTER — Ambulatory Visit (INDEPENDENT_AMBULATORY_CARE_PROVIDER_SITE_OTHER): Payer: BC Managed Care – PPO | Admitting: Internal Medicine

## 2021-08-15 DIAGNOSIS — E538 Deficiency of other specified B group vitamins: Secondary | ICD-10-CM | POA: Diagnosis not present

## 2021-08-15 NOTE — Patient Instructions (Signed)
[id number] ° °

## 2021-08-18 ENCOUNTER — Encounter: Payer: Self-pay | Admitting: Physician Assistant

## 2021-08-18 ENCOUNTER — Ambulatory Visit: Payer: BC Managed Care – PPO | Admitting: Physician Assistant

## 2021-08-18 ENCOUNTER — Ambulatory Visit: Payer: BC Managed Care – PPO | Admitting: Gastroenterology

## 2021-08-18 ENCOUNTER — Other Ambulatory Visit: Payer: Self-pay

## 2021-08-18 VITALS — BP 120/60 | HR 78 | Temp 98.0°F | Ht 67.03 in | Wt 226.0 lb

## 2021-08-18 DIAGNOSIS — E559 Vitamin D deficiency, unspecified: Secondary | ICD-10-CM | POA: Diagnosis not present

## 2021-08-18 DIAGNOSIS — R0602 Shortness of breath: Secondary | ICD-10-CM

## 2021-08-18 DIAGNOSIS — E785 Hyperlipidemia, unspecified: Secondary | ICD-10-CM

## 2021-08-18 DIAGNOSIS — F418 Other specified anxiety disorders: Secondary | ICD-10-CM | POA: Diagnosis not present

## 2021-08-18 DIAGNOSIS — I10 Essential (primary) hypertension: Secondary | ICD-10-CM

## 2021-08-18 DIAGNOSIS — E538 Deficiency of other specified B group vitamins: Secondary | ICD-10-CM

## 2021-08-18 DIAGNOSIS — K5080 Crohn's disease of both small and large intestine without complications: Secondary | ICD-10-CM

## 2021-08-18 DIAGNOSIS — Z79899 Other long term (current) drug therapy: Secondary | ICD-10-CM

## 2021-08-18 MED ORDER — QVAR REDIHALER 40 MCG/ACT IN AERB: 2.0000 | INHALATION_SPRAY | Freq: Two times a day (BID) | RESPIRATORY_TRACT | 3 refills | Status: DC

## 2021-08-18 MED ORDER — ALBUTEROL SULFATE HFA 108 (90 BASE) MCG/ACT IN AERS: 2.0000 | INHALATION_SPRAY | Freq: Four times a day (QID) | RESPIRATORY_TRACT | 1 refills | Status: DC | PRN

## 2021-08-18 NOTE — Progress Notes (Signed)
[name] [name] is a 46 y.o. male here for a follow up referred by former PCP, Dr. Jenny Reichmann.  History of Present Illness:   Chief Complaint  Patient presents with   Transfer of care   Hypertension   Anxiety   Depression    Post Covid- 19 SOB [name] [name] reports he recently had COVID 19 this past January and has found that he has been needing to use his albuterol inhaler multiple times a day. States he has recently gone from working from home for the past 3 years to now having to travel to [facility] and work in office. He admits that after having minimal physical activity to now walking often, he often feels out of breath upon arriving at work. Pt does have a hx of inhaler use due to past bronchitis illness. Denies hx of tobacco use, chest pain, lightheadedness, or dizziness.     Anxiety/Depression Pt expresses stress related to finding appropriate care in regards to his mental health. He has been regularly following up with Dr. Toy Care, psychiatry, who has had him trial multiple medications including trintellix and vilazodone which he found to either make him vomit or excessively cry. According to pt, due to this he underwent genetic testing and as told that he had major depressive disorder and that he was not able to process antidepressants regularly without taking the methylfolate supplement. Although this was not fully understood by him, he bought the supplement but has not taken it due to not trusting it.   Additionally he is compliant with taking xanax 5 mg daily and ambien 10 mg nightly for sleep. Although he has been taking these medications for years, he would like to wean off of them due to their ineffectiveness and addictive capabilities.  Per chart review from prior PCP:  He has been on 1 mg xanax since at least 2014. At that time he was also on paxil 40 mg daily. He ended up getting increased to 3 mg extended release xanax in 2016. In 2017 he was weaned off of paxil and was given cymbalta.  By 2021 he was on 3 mg extended release xanax and 2 mg xanax that he takes in AM and PM. In February 2022 he was transitioned to prozac and then referred to psychiatry later that year due to ongoing issues with worsening anxiety and depression. He is now seeing Dr. Toy Care as stated above.  Currently he feels he is out of control in terms of his mood and is trying to find someone who could help him sort everything out. At one point he was participating in talk therapy but has since stopped due to not finding it beneficial. Denies SI/HI.    HTN Currently taking lisinopril-hydrochlorothiazide 20-25 mg daily with no complications. At home blood pressure readings are: checked regularly and WNL per patient report. Patient denies chest pain, SOB, blurred vision, dizziness, unusual headaches, lower leg swelling. Denies excessive caffeine intake, stimulant usage, excessive alcohol intake, or increase in salt consumption.  BP Readings from Last 3 Encounters:  08/18/21 120/60  07/17/21 94/68  11/22/20 120/70    HLD Pt is compliant with taking crestor 20 mg daily with no adverse effects for about 5-6 months. He is managing well with this medication. Denies CP or SOB.    Crohn's Disease/Bowel Resection [name] is compliant with taking Humira 40 mg weekly injections with no complications. In the past he has undergone a bowel resection of his small and large intestine in 07/2007 which was  completed with no complications. Pt has been regularly following up with Dr. Carlean Purl, gastroenterology, about this issue and has been managing well.    B12 Deficiency  Although pt has been compliant with receiving monthly B12 injections by Dr. Carlean Purl, he isn't sure if it is helping. He is not taking an OTC supplement and would like re-check these levels to be sure they are improving.    HIV Exposure  Due to having a significant other with HIV, [name] has been routinely tested for this annually and has resulted as  negative each time. Although he is HIV negative, he has been compliant with taking Truvada 200-300 mg daily. This has been managed by Cassie Kuppelweiser. Denies concerning sx.    Vitamin D Deficiency  [name] is currently taking an OTC vitamin D supplements with no complications. He is interested in re-checking this today. Denies concerning sx.    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   COVID-19 06/2021   Crohn's disease ([place]) followed by dr Carlean Purl   small and large intestines--  hx bowel resection 02/ 2009--  currently treated w/ humira   Essential hypertension, benign    History of CMV 10/14/2010   resolved w/ antiviral therapy   History of kidney stones    History of resection of small bowel    Hyperlipidemia    Major depressive disorder    OSA (obstructive sleep apnea)    01-05-2018 per pt resolved since tonsils removed    Ventral hernia    Vitamin D deficiency      Social History   Tobacco Use   Smoking status: Never   Smokeless tobacco: Never  Vaping Use   Vaping Use: Never used  Substance Use Topics   Alcohol use: Yes    Alcohol/week: 3.0 standard drinks    Types: 3 Shots of liquor per week   Drug use: No    Past Surgical History:  Procedure Laterality Date   ANAL FISTULOTOMY N/A 01/07/2018   Procedure: ANAL FISTULOTOMY;  Surgeon: Leighton Ruff, MD;  Location: [facility];  Service: General;  Laterality: N/A;   COLONOSCOPY  last one 04-2017   dr Carlean Purl   EXPLORATORY LAPAROTOMY W/ ILEOSECECTOMY W/ PRIMARY ANASTAMOSIS  08-05-2007    dr gross  [facility]   crohn's, sbo, perferation's   GANGLION CYST EXCISION Right 2016   wrist   HYPOSPADIAS CORRECTION  CHILD   SPHINCTEROTOMY N/A 01/07/2018   Procedure: CHEMICAL SPHINCTEROTOMY (BOTOX);  Surgeon: Leighton Ruff, MD;  Location: [facility];  Service: General;  Laterality:  N/A;   TONSILLECTOMY Bilateral 09/30/2015   Procedure: BILATERAL TONSILLECTOMY;  Surgeon: Izora Gala, MD;  Location[facility];  Service: ENT;  Laterality: Bilateral;   UPPER GASTROINTESTINAL ENDOSCOPY  10/15/2010   w/biopsy, mild gastritis and duodenitis    Family History  Problem Relation Age of Onset   Heart disease Father        CAD/MI   Hyperlipidemia Father    Hypertension Father    Gout Father    Sleep apnea Father    Obesity Mother    Allergies Mother    Cancer Mother    Stroke Maternal Grandfather    Allergies Sister    Asthma Sister    Colon cancer Neg Hx    Diabetes Neg Hx    COPD Neg Hx    Colon polyps  Neg Hx    Stomach cancer Neg Hx    Pancreatic cancer Neg Hx     No Known Allergies  Current Medications:   Current Outpatient Medications:    acetaminophen (TYLENOL) 325 MG tablet, Take 650 mg by mouth every 6 (six) hours as needed for moderate pain., Disp: , Rfl:    Adalimumab (HUMIRA PEN) 40 MG/0.4ML PNKT, INJECT 40 MG UNDER THE SKIN ONCE A WEEK, Disp: 12 each, Rfl: 3   albuterol (VENTOLIN HFA) 108 (90 Base) MCG/ACT inhaler, Inhale 2 puffs into the lungs every 6 (six) hours as needed for wheezing or shortness of breath., Disp: 8 g, Rfl: 1   ALPRAZolam (XANAX XR) 3 MG 24 hr tablet, TAKE 1 TABLET BY MOUTH EVERY DAY IN THE MORNING, Disp: 30 tablet, Rfl: 2   alprazolam (XANAX) 2 MG tablet, TAKE 1/2 TAB BY MOUTH TWICE DAILY AS NEEDED., Disp: 30 tablet, Rfl: 2   cyanocobalamin (,VITAMIN B-12,) 1000 MCG/ML injection, INJECT 1 ML (1,000 MCG TOTAL) INTO THE MUSCLE EVERY 30 (THIRTY) DAYS., Disp: 3 mL, Rfl: 3   Dextromethorphan-buPROPion ER (AUVELITY) 45-105 MG TBCR, Take 1 tablet by mouth in the morning and at bedtime., Disp: , Rfl:    diphenoxylate-atropine (LOMOTIL) 2.5-0.025 MG tablet, TAKE 1 TABLET BY MOUTH 4 TIMES A DAY AS NEEDED FOR DIARRHEA/LOOSE STOOLS, Disp: 90 tablet, Rfl: 1   emtricitabine-tenofovir (TRUVADA) 200-300 MG tablet, Take 1 tablet by mouth daily., Disp: 30  tablet, Rfl: 5   L-Methylfolate-Algae (DEPLIN 15 PO), Take 1 capsule by mouth daily. A medical food supplement, Disp: , Rfl:    lisinopril-hydrochlorothiazide (ZESTORETIC) 20-25 MG tablet, TAKE 1 TABLET BY MOUTH EVERY DAY, Disp: 90 tablet, Rfl: 3   nystatin-triamcinolone ointment (MYCOLOG), Apply 1 application topically 2 (two) times daily., Disp: 30 g, Rfl: 1   ondansetron (ZOFRAN) 4 MG tablet, TAKE 1 TAB BY MOUTH EVERY 6 HOURS (Patient taking differently: Take 4 mg by mouth every 6 (six) hours as needed for nausea or vomiting.), Disp: 30 tablet, Rfl: 1   rosuvastatin (CRESTOR) 20 MG tablet, Take 1 tablet (20 mg total) by mouth daily., Disp: 90 tablet, Rfl: 3   Vilazodone HCl (VIIBRYD) 40 MG TABS, Take 20 mg by mouth daily., Disp: , Rfl:    zolpidem (AMBIEN) 10 MG tablet, TAKE 1 TABLET BY MOUTH EVERY DAY AT BEDTIME AS NEEDED FOR SLEEP, Disp: 30 tablet, Rfl: 5  Current Facility-Administered Medications:    cyanocobalamin ((VITAMIN B-12)) injection 1,000 mcg, 1,000 mcg, Intramuscular, Q30 days, Gatha Mayer, MD, 1,000 mcg at 05/20/21 9381   Review of Systems:   Review of Systems  Psychiatric/Behavioral:  Positive for depression.    Vitals:   Vitals:   08/18/21 1535  BP: 120/60  Pulse: 78  Temp: 98 F (36.7 C)  TempSrc: Temporal  SpO2: 95%  Weight: 226 lb (102.5 kg)  Height: 5' 7.03" (1.703 m)     Body mass index is 35.37 kg/m.  Physical Exam:   Physical Exam Vitals and nursing note reviewed.  Constitutional:      General: He is not in acute distress.    Appearance: He is well-developed. He is not ill-appearing or toxic-appearing.  Cardiovascular:     Rate and Rhythm: Normal rate and regular rhythm.     Pulses: Normal pulses.     Heart sounds: Normal heart sounds, S1 normal and S2 normal.  Pulmonary:     Effort: Pulmonary effort is normal.     Breath sounds: Normal breath sounds.  Skin:    General: Skin is warm and dry.  Neurological:     Mental Status: He is alert.      GCS: GCS eye subscore is 4. GCS verbal subscore is 5. GCS motor subscore is 6.  Psychiatric:        Speech: Speech normal.        Behavior: Behavior normal. Behavior is cooperative.    Assessment and Plan:   Shortness of breath Uncontrolled Will trial ICS in AM and PM (QVAR sent), and albuterol prn Follow-up in 1 month If remains uncontrolled, will refer to pulmonology  Anxiety with depression Uncontrolled I have given him a recommendation to reach out to Dr. Daron Offer for his psychiatric care I will take over xanax rx, however I discussed that we will begin weaning this -- he is agreeable Follow-up in 1 month I discussed with patient that if they develop any SI, to tell someone immediately and seek medical attention.  Essential hypertension, benign Well controlled Continue lisinopril-hctz 20-25 mg daily Follow-up in 6 months, sooner if concerns  Hyperlipidemia, unspecified hyperlipidemia type Well controlled Continue crestor 20 mg daily Follow-up in 6 months, sooner if concerns  Crohn's disease of both small and large intestine without complication ([place]) Reviewed Mgmt per Carlean Purl at [facility]  On pre-exposure prophylaxis for HIV Discussed with patient and infectious disease Will take over care Patient needs HIV screening q 3 months for truvada  Vitamin D deficiency Update level and provide recommendations accordingly  B12 deficiency Update level and provide recommendations accordingly  I,Havlyn C Ratchford,acting as a scribe for [employer], PA.,have documented all relevant documentation on the behalf of Inda Coke, PA,as directed by  Inda Coke, PA while in the presence of Inda Coke, [place].  I, Inda Coke, [place], have reviewed all documentation for this visit. The documentation on 08/19/21 for the exam, diagnosis, procedures, and orders are all accurate and complete.  Time spent with patient today was 60 minutes which consisted of chart review,  discussing diagnosis, work up, treatment answering questions and documentation. I also personally reached out to infectious disease to discuss patient and reviewed their care plan.  Inda Coke, [name]

## 2021-08-18 NOTE — Patient Instructions (Addendum)
It was great to see you!  Let's try to get you plugged in with Dr. Daron Offer - he is local here in [place] but only operates online for the time being Dr. Daron Offer offers psychiatric care visits through two [facility].  [place] no longer accepts or manages psychiatric medication management outside of these platforms.  Visit the links below to see if your insurance plan is eligible for participation.  Both platforms also offer fee based services which is set directly by each respective company.    [facility]: [email or web address]  [facility]: [email or web address]  Patients of [facility] and [facility] must communicate any medical questions, records requests, or refill requests directly through those platforms or schedule a follow up visit to discuss any additional needs or concerns.   Additional information I will be in touch with your lab results.  Start QVAR maintenance inhaler Use two puffs in AM and two puffs in PM  I will reach out to Infectious Disease to make sure we are all on the same page about [place] taking over your Truvada  Let's follow-up in 1 month, sooner if concerns  Take care,  Inda Coke [name]

## 2021-08-19 DIAGNOSIS — Z79899 Other long term (current) drug therapy: Secondary | ICD-10-CM | POA: Insufficient documentation

## 2021-08-19 LAB — CBC WITH DIFFERENTIAL/PLATELET
Basophils Absolute: 0.1 10*3/uL (ref 0.0–0.1)
Basophils Relative: 0.7 % (ref 0.0–3.0)
Eosinophils Absolute: 0.2 10*3/uL (ref 0.0–0.7)
Eosinophils Relative: 2.2 % (ref 0.0–5.0)
HCT: 48 % (ref 39.0–52.0)
Hemoglobin: 16 g/dL (ref 13.0–17.0)
Lymphocytes Relative: 33.7 % (ref 12.0–46.0)
Lymphs Abs: 3.1 10*3/uL (ref 0.7–4.0)
MCHC: 33.4 g/dL (ref 30.0–36.0)
MCV: 99.8 fl (ref 78.0–100.0)
Monocytes Absolute: 0.9 10*3/uL (ref 0.1–1.0)
Monocytes Relative: 9.4 % (ref 3.0–12.0)
Neutro Abs: 5 10*3/uL (ref 1.4–7.7)
Neutrophils Relative %: 54 % (ref 43.0–77.0)
Platelets: 224 10*3/uL (ref 150.0–400.0)
RBC: 4.81 Mil/uL (ref 4.22–5.81)
RDW: 13.6 % (ref 11.5–15.5)
WBC: 9.2 10*3/uL (ref 4.0–10.5)

## 2021-08-19 LAB — COMPREHENSIVE METABOLIC PANEL
ALT: 19 U/L (ref 0–53)
AST: 20 U/L (ref 0–37)
Albumin: 5 g/dL (ref 3.5–5.2)
Alkaline Phosphatase: 72 U/L (ref 39–117)
BUN: 14 mg/dL (ref 6–23)
CO2: 24 mEq/L (ref 19–32)
Calcium: 10 mg/dL (ref 8.4–10.5)
Chloride: 99 mEq/L (ref 96–112)
Creatinine, Ser: 1.46 mg/dL (ref 0.40–1.50)
GFR: 57.34 mL/min — ABNORMAL LOW (ref >60.00)
Glucose, Bld: 92 mg/dL (ref 70–99)
Potassium: 4 mEq/L (ref 3.5–5.1)
Sodium: 136 mEq/L (ref 135–145)
Total Bilirubin: 0.5 mg/dL (ref 0.2–1.2)
Total Protein: 8 g/dL (ref 6.0–8.3)

## 2021-08-19 LAB — VITAMIN D 25 HYDROXY (VIT D DEFICIENCY, FRACTURES): VITD: 51.5 ng/mL (ref 30.00–100.00)

## 2021-08-25 ENCOUNTER — Ambulatory Visit: Payer: BC Managed Care – PPO | Admitting: Gastroenterology

## 2021-08-29 ENCOUNTER — Encounter: Payer: Self-pay | Admitting: Physician Assistant

## 2021-09-12 ENCOUNTER — Ambulatory Visit: Payer: BC Managed Care – PPO | Admitting: Gastroenterology

## 2021-09-12 ENCOUNTER — Ambulatory Visit (INDEPENDENT_AMBULATORY_CARE_PROVIDER_SITE_OTHER): Payer: BC Managed Care – PPO | Admitting: Internal Medicine

## 2021-09-12 ENCOUNTER — Other Ambulatory Visit: Payer: Self-pay

## 2021-09-12 DIAGNOSIS — E538 Deficiency of other specified B group vitamins: Secondary | ICD-10-CM | POA: Diagnosis not present

## 2021-09-12 NOTE — Progress Notes (Signed)
Patient given B12 injection. Patient tolerated well ?

## 2021-09-15 ENCOUNTER — Ambulatory Visit: Payer: BC Managed Care – PPO | Admitting: Physician Assistant

## 2021-09-15 ENCOUNTER — Encounter: Payer: Self-pay | Admitting: Physician Assistant

## 2021-09-15 VITALS — BP 100/67 | HR 70 | Temp 98.7°F | Ht 67.0 in | Wt 226.8 lb

## 2021-09-15 DIAGNOSIS — Z79899 Other long term (current) drug therapy: Secondary | ICD-10-CM

## 2021-09-15 DIAGNOSIS — R0602 Shortness of breath: Secondary | ICD-10-CM

## 2021-09-15 DIAGNOSIS — G8929 Other chronic pain: Secondary | ICD-10-CM | POA: Diagnosis not present

## 2021-09-15 DIAGNOSIS — K5080 Crohn's disease of both small and large intestine without complications: Secondary | ICD-10-CM

## 2021-09-15 DIAGNOSIS — F418 Other specified anxiety disorders: Secondary | ICD-10-CM

## 2021-09-15 DIAGNOSIS — R519 Headache, unspecified: Secondary | ICD-10-CM | POA: Diagnosis not present

## 2021-09-15 MED ORDER — EMTRICITABINE-TENOFOVIR DF 200-300 MG PO TABS: 1.0000 | ORAL_TABLET | Freq: Every day | ORAL | 2 refills | Status: DC

## 2021-09-15 NOTE — Patient Instructions (Signed)
It was great to see you! ? ?Please get an updated eye exam  ? ?Please ask Dr. Toy Care about taking over your xanax and helping you find a more effective way to control your anxiety -- let me know if she is not willing to help with this ? ?I have refilled Truvada, we need HIV tests every 3 months to fill this medication ? ?For your headaches, I would like to get an MRI, you will be contacted about this. ?Drink more water. ?Consider checking your blood pressure to make sure its not fluctuating. ? ?Let's follow-up in 3 months, sooner if you have concerns. ? ?If a referral was placed today, you will be contacted for an appointment. Please note that routine referrals can sometimes take up to 3-4 weeks to process. Please call our office if you haven't heard anything after this time frame. ? ?Take care, ? ?Inda Coke [name]  ?

## 2021-09-15 NOTE — Progress Notes (Addendum)
[name] [name] is a 46 y.o. male here for follow up of a pre-existing problem.  ? ?History of Present Illness:  ? ?Chief Complaint  ?Patient presents with  ? Follow-up  ?  Medication question   ? ? ?HPI ? ?Anxiety/Depression ?Following our previous visit on 08/18/21, pt was unable to connect with Dr. Daron Offer due to the provider not accepting his insurance. Despite this he is still wondering if there is another medication he could trial since he is now compliant with taking the methyfolate that was recommended by Dr. Toy Care, psychiatry. States that although he is still compliant with taking a total of xanax 5 mg (3 mg xanax XR and 1 mg xanax BID), he still feels on edge and is constantly looking for something to calm him down. Denies SI/HI. ? ?Headaches ?Pt states since cutting back on his intake of tylenol, he has been experiencing multiple headaches. States pain make him feel like his head is going to explode especially after bending over, standing up too fast, or coughing too much. According to pt he has seen stars on occasion, which led to him wanting to have this evaluated. He does admit he doesn't drink water often, instead choosing coffee but this is his norm. Denies: weakness, changes in vision, n/v. ? ?MRI brain 2013: ?IMPRESSION:  ?No acute intracranial findings.  Slight empty sella.  No abnormal  ?post contrast enhancement.  ? ?CT of head and Angio 2013: ?IMPRESSION:  ?1.  Negative CTA of the head.  ?2.  The dural sinuses are patent.  ?3.  No evidence for AV fistula.  ?4.  Mild small vessel disease.  ? ?Post Covid SOB ?Since previous visit, pt has been compliant with using Qvar redihaler 40 mcg two puffs in the AM and Pm and albuterol 108 mcg base inhaler with no adverse effects. He has found this to be extremely beneficial and found he hasn't needed to use the albuterol inhaler since using the Qvar.  ? ?HIV Exposure ?Pt remains compliant with taking Truvada 200-300 mg daily with no complications. Although he  is following up with Cassie Kuppelweiser, infectious disease, he is interested in having me manage this issue.  ? ?Crohns disease ?Needs quantiferon done for his humira. Would like that drawn today since he is here. ? ?Past Medical History:  ?Diagnosis Date  ? Anal fissure and fistula   ? chronic  ? Anxiety   ? Bile salt-induced diarrhea after ielo-colonic resection 12/20/2017  ? COVID-19 06/2021  ? Crohn's disease ([place]) followed by dr Carlean Purl  ? small and large intestines--  hx bowel resection 02/ 2009--  currently treated w/ humira  ? Essential hypertension, benign   ? History of CMV 10/14/2010  ? resolved w/ antiviral therapy  ? History of kidney stones   ? History of resection of small bowel   ? Hyperlipidemia   ? Major depressive disorder   ? OSA (obstructive sleep apnea)   ? 01-05-2018 per pt resolved since tonsils removed   ? Ventral hernia   ? Vitamin D deficiency   ? ?  ?Social History  ? ?Tobacco Use  ? Smoking status: Never  ? Smokeless tobacco: Never  ?Vaping Use  ? Vaping Use: Never used  ?Substance Use Topics  ? Alcohol use: Yes  ?  Alcohol/week: 3.0 standard drinks  ?  Types: 3 Shots of liquor per week  ? Drug use: No  ? ? ?Past Surgical History:  ?Procedure Laterality Date  ? ANAL FISTULOTOMY N/A  01/07/2018  ? Procedure: ANAL FISTULOTOMY;  Surgeon: Leighton Ruff, MD;  [facility]: [facility];  Service: General;  Laterality: N/A;  ? COLONOSCOPY  last one 04-2017   dr Carlean Purl  ? EXPLORATORY LAPAROTOMY W/ ILEOSECECTOMY W/ PRIMARY ANASTAMOSIS  08-05-2007    dr gross  [facility]  ? crohn's, sbo, perferation's  ? GANGLION CYST EXCISION Right 2016  ? wrist  ? HYPOSPADIAS CORRECTION  CHILD  ? SPHINCTEROTOMY N/A 01/07/2018  ? Procedure: CHEMICAL SPHINCTEROTOMY (BOTOX);  Surgeon: Leighton Ruff, MD;  Location: [facility];  Service: General;  Laterality: N/A;  ? TONSILLECTOMY Bilateral 09/30/2015  ? Procedure: BILATERAL TONSILLECTOMY;  Surgeon: Izora Gala, MD;  Location: [place];  Service:  ENT;  Laterality: Bilateral;  ? UPPER GASTROINTESTINAL ENDOSCOPY  10/15/2010  ? w/biopsy, mild gastritis and duodenitis  ? ? ?Family History  ?Problem Relation Age of Onset  ? Heart disease Father   ?     CAD/MI  ? Hyperlipidemia Father   ? Hypertension Father   ? Gout Father   ? Sleep apnea Father   ? Obesity Mother   ? Allergies Mother   ? Cancer Mother   ? Stroke Maternal Grandfather   ? Allergies Sister   ? Asthma Sister   ? Colon cancer Neg Hx   ? Diabetes Neg Hx   ? COPD Neg Hx   ? Colon polyps Neg Hx   ? Stomach cancer Neg Hx   ? Pancreatic cancer Neg Hx   ? ? ?No Known Allergies ? ?Current Medications:  ? ?Current Outpatient Medications:  ?  acetaminophen (TYLENOL) 325 MG tablet, Take 650 mg by mouth every 6 (six) hours as needed for moderate pain., Disp: , Rfl:  ?  Adalimumab (HUMIRA PEN) 40 MG/0.4ML PNKT, INJECT 40 MG UNDER THE SKIN ONCE A WEEK, Disp: 12 each, Rfl: 3 ?  albuterol (VENTOLIN HFA) 108 (90 Base) MCG/ACT inhaler, Inhale 2 puffs into the lungs every 6 (six) hours as needed for wheezing or shortness of breath., Disp: 8 g, Rfl: 1 ?  ALPRAZolam (XANAX XR) 3 MG 24 hr tablet, TAKE 1 TABLET BY MOUTH EVERY DAY IN THE MORNING, Disp: 30 tablet, Rfl: 2 ?  alprazolam (XANAX) 2 MG tablet, TAKE 1/2 TAB BY MOUTH TWICE DAILY AS NEEDED., Disp: 30 tablet, Rfl: 2 ?  beclomethasone (QVAR REDIHALER) 40 MCG/ACT inhaler, Inhale 2 puffs into the lungs 2 (two) times daily., Disp: 1 each, Rfl: 3 ?  cyanocobalamin (,VITAMIN B-12,) 1000 MCG/ML injection, INJECT 1 ML (1,000 MCG TOTAL) INTO THE MUSCLE EVERY 30 (THIRTY) DAYS., Disp: 3 mL, Rfl: 3 ?  Dextromethorphan-buPROPion ER (AUVELITY) 45-105 MG TBCR, Take 1 tablet by mouth in the morning and at bedtime., Disp: , Rfl:  ?  diphenoxylate-atropine (LOMOTIL) 2.5-0.025 MG tablet, TAKE 1 TABLET BY MOUTH 4 TIMES A DAY AS NEEDED FOR DIARRHEA/LOOSE STOOLS, Disp: 90 tablet, Rfl: 1 ?  L-Methylfolate-Algae (DEPLIN 15 PO), Take 1 capsule by mouth daily. A medical food supplement, Disp:  , Rfl:  ?  lisinopril-hydrochlorothiazide (ZESTORETIC) 20-25 MG tablet, TAKE 1 TABLET BY MOUTH EVERY DAY, Disp: 90 tablet, Rfl: 3 ?  nystatin-triamcinolone ointment (MYCOLOG), Apply 1 application topically 2 (two) times daily., Disp: 30 g, Rfl: 1 ?  ondansetron (ZOFRAN) 4 MG tablet, TAKE 1 TAB BY MOUTH EVERY 6 HOURS (Patient taking differently: Take 4 mg by mouth every 6 (six) hours as needed for nausea or vomiting.), Disp: 30 tablet, Rfl: 1 ?  rosuvastatin (CRESTOR) 20 MG tablet, Take  1 tablet (20 mg total) by mouth daily., Disp: 90 tablet, Rfl: 3 ?  Vilazodone HCl (VIIBRYD) 40 MG TABS, Take 20 mg by mouth daily., Disp: , Rfl:  ?  zolpidem (AMBIEN) 10 MG tablet, TAKE 1 TABLET BY MOUTH EVERY DAY AT BEDTIME AS NEEDED FOR SLEEP, Disp: 30 tablet, Rfl: 5 ?  emtricitabine-tenofovir (TRUVADA) 200-300 MG tablet, Take 1 tablet by mouth daily., Disp: 30 tablet, Rfl: 2 ? ?Current Facility-Administered Medications:  ?  cyanocobalamin ((VITAMIN B-12)) injection 1,000 mcg, 1,000 mcg, Intramuscular, Q30 days, Gatha Mayer, MD, 1,000 mcg at 09/12/21 2811  ? ?Review of Systems:  ? ?ROS ?Negative unless otherwise specified per HPI. ? ?Vitals:  ? ?Vitals:  ? 09/15/21 1500  ?BP: 100/67  ?Pulse: 70  ?Temp: 98.7 ?F (37.1 ?C)  ?TempSrc: Temporal  ?SpO2: 98%  ?Weight: 226 lb 12.8 oz (102.9 kg)  ?Height: 5' 7"  (1.702 m)  ?   ?Body mass index is 35.52 kg/m?. ? ? ? ? ?Physical Exam:  ? ?Physical Exam ?Vitals and nursing note reviewed.  ?Constitutional:   ?   General: He is not in acute distress. ?   Appearance: He is well-developed. He is not ill-appearing or toxic-appearing.  ?Cardiovascular:  ?   Rate and Rhythm: Normal rate and regular rhythm.  ?   Pulses: Normal pulses.  ?   Heart sounds: Normal heart sounds, S1 normal and S2 normal.  ?Pulmonary:  ?   Effort: Pulmonary effort is normal.  ?   Breath sounds: Normal breath sounds.  ?Skin: ?   General: Skin is warm and dry.  ?Neurological:  ?   General: No focal deficit present.  ?    Mental Status: He is alert.  ?   GCS: GCS eye subscore is 4. GCS verbal subscore is 5. GCS motor subscore is 6.  ?   Cranial Nerves: Cranial nerves 2-12 are intact.  ?   Sensory: Sensation is intact.  ?

## 2021-09-16 LAB — BASIC METABOLIC PANEL
BUN: 12 mg/dL (ref 6–23)
CO2: 25 mEq/L (ref 19–32)
Calcium: 9.8 mg/dL (ref 8.4–10.5)
Chloride: 98 mEq/L (ref 96–112)
Creatinine, Ser: 1.32 mg/dL (ref 0.40–1.50)
GFR: 64.67 mL/min (ref >60.00)
Glucose, Bld: 99 mg/dL (ref 70–99)
Potassium: 4.1 mEq/L (ref 3.5–5.1)
Sodium: 133 mEq/L — ABNORMAL LOW (ref 135–145)

## 2021-09-19 ENCOUNTER — Encounter: Payer: Self-pay | Admitting: Physician Assistant

## 2021-09-21 LAB — HIV ANTIBODY (ROUTINE TESTING W REFLEX): HIV 1&2 Ab, 4th Generation: NONREACTIVE

## 2021-09-21 LAB — QUANTIFERON-TB GOLD PLUS
Mitogen-NIL: >10 IU/mL
NIL: 0.03 IU/mL
QuantiFERON-TB Gold Plus: NEGATIVE
TB1-NIL: 0.01 IU/mL
TB2-NIL: 0.03 IU/mL

## 2021-10-06 ENCOUNTER — Telehealth: Payer: Self-pay | Admitting: Gastroenterology

## 2021-10-06 NOTE — Telephone Encounter (Signed)
Patient wanted to let you know he rescheduled his B12 injection to Monday 4/24 at 8:10 because he will be out of town on Friday.  Thank you. ?

## 2021-10-06 NOTE — Telephone Encounter (Signed)
noted 

## 2021-10-13 ENCOUNTER — Ambulatory Visit (INDEPENDENT_AMBULATORY_CARE_PROVIDER_SITE_OTHER): Payer: BC Managed Care – PPO | Admitting: Internal Medicine

## 2021-10-13 DIAGNOSIS — E538 Deficiency of other specified B group vitamins: Secondary | ICD-10-CM

## 2021-10-16 ENCOUNTER — Telehealth: Payer: Self-pay

## 2021-10-16 NOTE — Telephone Encounter (Signed)
Jawan messaged [place] that he is being charged $56.74 for [place] to administer his B12 shot. Per Barb Merino, CGRN she is going to call billing to try and clear up this error. I will let Jahrell know we are working on this matter. He said if he has to pay then he just wants syringes sent in for him to do it himself. I have sent esiquio boesen message that we are working on this. ?

## 2021-10-19 ENCOUNTER — Other Ambulatory Visit: Payer: Self-pay | Admitting: Physician Assistant

## 2021-11-10 ENCOUNTER — Ambulatory Visit: Payer: BC Managed Care – PPO | Admitting: Gastroenterology

## 2021-11-10 ENCOUNTER — Encounter: Payer: Self-pay | Admitting: Gastroenterology

## 2021-11-10 VITALS — BP 119/80 | HR 75 | Ht 67.0 in | Wt 227.0 lb

## 2021-11-10 DIAGNOSIS — R1011 Right upper quadrant pain: Secondary | ICD-10-CM | POA: Diagnosis not present

## 2021-11-10 DIAGNOSIS — L290 Pruritus ani: Secondary | ICD-10-CM

## 2021-11-10 DIAGNOSIS — K5080 Crohn's disease of both small and large intestine without complications: Secondary | ICD-10-CM | POA: Diagnosis not present

## 2021-11-10 DIAGNOSIS — G589 Mononeuropathy, unspecified: Secondary | ICD-10-CM | POA: Diagnosis not present

## 2021-11-10 NOTE — Progress Notes (Signed)
Chief Complaint:    Abdominal pain, rectal itching   HPI:     Patient is a 46 y.o. male with a ongstanding history of Crohn's Disease s/p ileocecectomy, and follows regularly with Dr. Carlean Purl, referred to me to discuss chronic abdominal pain and consideration of abdominal wall injection.   He describes pain on the right upper/mid abdomen. Has been present for a few months, and worse with right sided abdominal flexion. Describes as a "catch" and feel like getting "shanked". Lasts a minute or so then subsides.  Can recreate pain with certain movements.  Not related to PO intake. No associated GI sxs. Can occur every 3-4 days or so. No change with trial of pain medications. Does not recall any abdominal injuries.   Separately, he is also c/o rectal itching and burning discomfort. Has been present for the last 1.5 months or so. Trialed mycolog antifungal cream w/o much improvement.  No bleeding.  No change in bowel habits.  Crohn's otherwise in clinical remission with weekly Humira Lomotil prn fecal seepage.  Additionally, plan for B12 injection today.     Latest Ref Rng & Units 08/18/2021    4:15 PM 06/05/2021    9:21 AM 11/21/2020   10:27 AM  CBC  WBC 4.0 - 10.5 K/uL 9.2   8.2   9.3    Hemoglobin 13.0 - 17.0 g/dL 16.0   15.6   15.5    Hematocrit 39.0 - 52.0 % 48.0   45.2   45.8    Platelets 150.0 - 400.0 K/uL 224.0   241   255.0        Latest Ref Rng & Units 09/15/2021    3:50 PM 08/18/2021    4:15 PM 06/05/2021    9:21 AM  CMP  Glucose 70 - 99 mg/dL 99   92   99    BUN 6 - 23 mg/dL 12   14   14     Creatinine 0.40 - 1.50 mg/dL 1.32   1.46   1.21    Sodium 135 - 145 mEq/L 133   136   135    Potassium 3.5 - 5.1 mEq/L 4.1   4.0   4.5    Chloride 96 - 112 mEq/L 98   99   102    CO2 19 - 32 mEq/L 25   24   26     Calcium 8.4 - 10.5 mg/dL 9.8   10.0   9.6    Total Protein 6.0 - 8.3 g/dL  8.0   7.5    Total Bilirubin 0.2 - 1.2 mg/dL  0.5   0.4    Alkaline Phos 39 - 117 U/L  72     AST  0 - 37 U/L  20   19    ALT 0 - 53 U/L  19   22        Review of systems:     No chest pain, no SOB, no fevers, no urinary sx   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   COVID-19 06/2021   Crohn's disease ([place]) followed by dr Carlean Purl   small and large intestines--  hx bowel resection 02/ 2009--  currently treated w/ humira   Essential hypertension, benign    History of CMV 10/14/2010   resolved w/ antiviral therapy   History of kidney stones  History of resection of small bowel    Hyperlipidemia    Major depressive disorder    OSA (obstructive sleep apnea)    01-05-2018 per pt resolved since tonsils removed    Ventral hernia    Vitamin D deficiency     Patient's surgical history, family medical history, social history, medications and allergies were all reviewed in Epic    Current Outpatient Medications  Medication Sig Dispense Refill   acetaminophen (TYLENOL) 325 MG tablet Take 650 mg by mouth every 6 (six) hours as needed for moderate pain.     Adalimumab (HUMIRA PEN) 40 MG/0.4ML PNKT INJECT 40 MG UNDER THE SKIN ONCE A WEEK 12 each 3   albuterol (VENTOLIN HFA) 108 (90 Base) MCG/ACT inhaler Inhale 2 puffs into the lungs every 6 (six) hours as needed for wheezing or shortness of breath. 8 g 1   ALPRAZolam (XANAX XR) 3 MG 24 hr tablet TAKE 1 TABLET BY MOUTH EVERY DAY IN THE MORNING 30 tablet 2   alprazolam (XANAX) 2 MG tablet TAKE 1/2 TAB BY MOUTH TWICE DAILY AS NEEDED. 30 tablet 2   busPIRone (BUSPAR) 30 MG tablet Take 1 tablet by mouth in the morning and at bedtime.     cyanocobalamin (,VITAMIN B-12,) 1000 MCG/ML injection INJECT 1 ML (1,000 MCG TOTAL) INTO THE MUSCLE EVERY 30 (THIRTY) DAYS. 3 mL 3   Dextromethorphan-buPROPion ER (AUVELITY) 45-105 MG TBCR Take 1 tablet by mouth in the morning and at bedtime.     diphenoxylate-atropine (LOMOTIL) 2.5-0.025 MG  tablet TAKE 1 TABLET BY MOUTH 4 TIMES A DAY AS NEEDED FOR DIARRHEA/LOOSE STOOLS 90 tablet 1   emtricitabine-tenofovir (TRUVADA) 200-300 MG tablet Take 1 tablet by mouth daily. 30 tablet 2   L-Methylfolate-Algae (DEPLIN 15 PO) Take 1 capsule by mouth daily. A medical food supplement     lisinopril-hydrochlorothiazide (ZESTORETIC) 20-25 MG tablet TAKE 1 TABLET BY MOUTH EVERY DAY 90 tablet 3   nystatin-triamcinolone ointment (MYCOLOG) Apply 1 application topically 2 (two) times daily. 30 g 1   ondansetron (ZOFRAN) 4 MG tablet TAKE 1 TAB BY MOUTH EVERY 6 HOURS (Patient taking differently: Take 4 mg by mouth every 6 (six) hours as needed for nausea or vomiting.) 30 tablet 1   QVAR REDIHALER 40 MCG/ACT inhaler INHALE 2 PUFFS INTO THE LUNGS TWICE A DAY 10.6 g 3   rosuvastatin (CRESTOR) 20 MG tablet Take 1 tablet (20 mg total) by mouth daily. 90 tablet 3   zolpidem (AMBIEN) 10 MG tablet TAKE 1 TABLET BY MOUTH EVERY DAY AT BEDTIME AS NEEDED FOR SLEEP 30 tablet 5   Current Facility-Administered Medications  Medication Dose Route Frequency Provider Last Rate Last Admin   cyanocobalamin ((VITAMIN B-12)) injection 1,000 mcg  1,000 mcg Intramuscular Q30 days Gatha Mayer, MD   1,000 mcg at 10/13/21 4536      Physical Exam:     BP 119/80   Pulse 75   Ht 5' 7"  (1.702 m)   Wt 227 lb (103 kg)   BMI 35.55 kg/m   GENERAL:  Pleasant male in NAD PSYCH: Cooperative, normal affect EENT:  conjunctiva pink, mucous membranes moist CARDIAC:  RRR, no murmur heard, no peripheral edema PULM: Normal respiratory effort, lungs CTA bilaterally, no wheezing ABDOMEN:  +Carnett's sign with pinpoint TTP in the RUQ. Otherwise, nondistended, soft.  No peritoneal signs.  No obvious masses, no hepatomegaly,  normal bowel sounds SKIN:  turgor, no lesions seen Musculoskeletal:  Normal muscle tone, normal strength NEURO: Alert  and oriented x 3, no focal neurologic deficits Rectal exam: Mild skin breakdown about 2 cm away  from anus, near the coccyx. No drainage. Skin mildy indurated, but no fluctuance. No external anal fissures, fistulae, external hemorrhoids or skin. (Chaperone: Patient was offered a chaperone for this sensitive examination, but declined).      IMPRESSION and PLAN:    #1.  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  All risks, benefits and alternative forms of therapy were described and informed consent was obtained.  -The patient was draped and the site prepped in the usual sterile fashion.  Site of pain was reconfirmed by manual palpation. -Lidocaine 1%.  Injected 5 cc into the site of pain, with resolution of pain upon repeat palpation.  This confirmed the diagnosis of Abdominal Wall Syndrome. -Kenalog 40 mg.  Injected directly into the site of pain for therapeutic intervention of the above confirmed Abdominal Wall Syndrome. -Band-Aid applied -The patient was observed in the Gastroenterology Clinic for 15 minutes after the procedure. No complications were encountered and the patient tolerated the procedure well.  -I explained that pain may recur and approximately 20% of patients.  If the pain does recur, can follow-up with me for possible repeat injection in 4+ weeks.  Otherwise, to follow-up w/ me as needed.    #2.  Skin excoriation Small area of mild skin breakdown located about 2 cm away from the anus.  No appreciable improvement with an appropriate trial of antifungal therapy - Apply barrier cream (ie, Desitin, A&D) - If no improvement, can consider short trial of topical steroid  vs refer to Dermatology  #3.  Crohn's Disease - In clinical remission on Humira - Follow-up with Dr. Carlean Purl as planned    Lavena Bullion ,DO, [facility] 11/10/2021, 8:28 AM        Lavena Bullion ,DO, [facility] 11/10/2021, 8:28 AM

## 2021-11-10 NOTE — Patient Instructions (Signed)
If you are age 47 or younger, your body mass index should be between 19-25. Your Body mass index is 35.55 kg/m. If this is out of the aformentioned range listed, please consider follow up with your Primary Care Provider.   ________________________________________________________  The [facility] providers would like to encourage you to use [facility] to communicate with providers for non-urgent requests or questions.  Due to long hold times on the telephone, sending your provider a message by [facility] may be a faster and more efficient way to get a response.  Please allow 48 business hours for a response.  Please remember that this is for non-urgent requests.  _______________________________________________________   Due to recent changes in healthcare laws, you may see the results of your imaging and laboratory studies on MyChart before your provider has had a chance to review them.  We understand that in some cases there may be results that are confusing or concerning to you. Not all laboratory results come back in the same time frame and the provider may be waiting for multiple results in order to interpret others.  Please give [place] 48 hours in order for your provider to thoroughly review all the results before contacting the office for clarification of your results.     Thank you for choosing me and [facility].  Vito Cirigliano, D.O.

## 2021-11-13 ENCOUNTER — Other Ambulatory Visit: Payer: Self-pay | Admitting: Internal Medicine

## 2021-11-13 NOTE — Telephone Encounter (Signed)
Please refill as per office routine med refill policy (all routine meds to be refilled for 3 mo or monthly (per pt preference) up to one year from last visit, then month to month grace period for 3 mo, then further med refills will have to be denied) ? ?

## 2021-11-21 ENCOUNTER — Telehealth: Payer: Self-pay | Admitting: Physician Assistant

## 2021-11-21 NOTE — Telephone Encounter (Signed)
Noted  

## 2021-11-21 NOTE — Telephone Encounter (Signed)
Patient coordinator called : Lattie Haw From [place] to patient for coordination of care and benefit- Can be reached at [phone] direct ext [phone] if needed.    NO FURTHER ACTIONS NEEDED AT THE TIME

## 2021-11-27 ENCOUNTER — Other Ambulatory Visit: Payer: Self-pay | Admitting: Physician Assistant

## 2021-11-27 DIAGNOSIS — Z79899 Other long term (current) drug therapy: Secondary | ICD-10-CM

## 2021-11-29 ENCOUNTER — Other Ambulatory Visit: Payer: Self-pay | Admitting: Internal Medicine

## 2021-11-29 NOTE — Telephone Encounter (Signed)
To pcp please

## 2021-12-03 ENCOUNTER — Encounter: Payer: Self-pay | Admitting: Physician Assistant

## 2021-12-07 ENCOUNTER — Other Ambulatory Visit: Payer: Self-pay | Admitting: Internal Medicine

## 2021-12-07 NOTE — Telephone Encounter (Signed)
Please refill as per office routine med refill policy (all routine meds to be refilled for 3 mo or monthly (per pt preference) up to one year from last visit, then month to month grace period for 3 mo, then further med refills will have to be denied) ? ?

## 2021-12-08 ENCOUNTER — Ambulatory Visit (INDEPENDENT_AMBULATORY_CARE_PROVIDER_SITE_OTHER): Payer: BC Managed Care – PPO | Admitting: Internal Medicine

## 2021-12-08 DIAGNOSIS — E538 Deficiency of other specified B group vitamins: Secondary | ICD-10-CM

## 2022-01-12 ENCOUNTER — Telehealth: Payer: Self-pay

## 2022-01-12 ENCOUNTER — Ambulatory Visit (INDEPENDENT_AMBULATORY_CARE_PROVIDER_SITE_OTHER): Payer: BC Managed Care – PPO | Admitting: Internal Medicine

## 2022-01-12 DIAGNOSIS — E538 Deficiency of other specified B group vitamins: Secondary | ICD-10-CM

## 2022-01-12 NOTE — Telephone Encounter (Signed)
Lin came in today for his B12 and is having GI issues that he wants to see Dr Carlean Purl for. Dr Carlean Purl is out this week and no open appointments until September. I left [name] [name] a detailed message that I can ask Dr Carlean Purl about working him in on 01/20/2022 or we can check availability of the APP's schedule.

## 2022-01-21 NOTE — Telephone Encounter (Signed)
I left [name] [name] a message to check in with him and see if he wants to book an appointment.

## 2022-01-22 NOTE — Telephone Encounter (Signed)
Patient is requesting to be seen sooner if possible stated he can possibly be worked in. He said he can only do a Monday or Friday.

## 2022-01-22 NOTE — Telephone Encounter (Signed)
Please advise Sir, thank you. 

## 2022-01-23 ENCOUNTER — Other Ambulatory Visit (HOSPITAL_COMMUNITY)
Admission: RE | Admit: 2022-01-23 | Discharge: 2022-01-23 | Disposition: A | Discharge status: Home / Self Care | Payer: BC Managed Care – PPO | Source: Ambulatory Visit | Attending: Physician Assistant | Admitting: Physician Assistant

## 2022-01-23 ENCOUNTER — Telehealth: Payer: Self-pay | Admitting: Pharmacy Technician

## 2022-01-23 ENCOUNTER — Ambulatory Visit: Payer: BC Managed Care – PPO | Admitting: Physician Assistant

## 2022-01-23 ENCOUNTER — Encounter: Payer: Self-pay | Admitting: Physician Assistant

## 2022-01-23 ENCOUNTER — Other Ambulatory Visit (HOSPITAL_COMMUNITY): Payer: Self-pay

## 2022-01-23 VITALS — BP 118/72 | HR 89 | Temp 98.3°F | Ht 67.0 in | Wt 223.0 lb

## 2022-01-23 DIAGNOSIS — Z79899 Other long term (current) drug therapy: Secondary | ICD-10-CM

## 2022-01-23 DIAGNOSIS — A630 Anogenital (venereal) warts: Secondary | ICD-10-CM | POA: Diagnosis not present

## 2022-01-23 DIAGNOSIS — F418 Other specified anxiety disorders: Secondary | ICD-10-CM | POA: Diagnosis not present

## 2022-01-23 DIAGNOSIS — R21 Rash and other nonspecific skin eruption: Secondary | ICD-10-CM

## 2022-01-23 MED ORDER — KETOCONAZOLE 2 % EX CREA
1.0000 | TOPICAL_CREAM | Freq: Two times a day (BID) | CUTANEOUS | 0 refills | Status: DC
Start: 2022-01-23 — End: 2023-05-14

## 2022-01-23 NOTE — Telephone Encounter (Signed)
PLAN LIMITATION EXCEEDED APPROVED

## 2022-01-23 NOTE — Telephone Encounter (Signed)
Burhan will take that appointment and said thank you .

## 2022-01-23 NOTE — Telephone Encounter (Signed)
Patient Advocate Encounter  Received notification from [employer] that prior authorization for HUMIRA 40MG/0.4ML is required.   PA submitted on 8.4.23 Key [id number] Status is pending    Luciano Cutter, CPhT Patient Advocate Phone: [phone]

## 2022-01-23 NOTE — Telephone Encounter (Signed)
8/21 at 350 best I can do

## 2022-01-23 NOTE — Patient Instructions (Signed)
It was great to see you!  Apply nizoral cream to the area on your bum  Please let me know what I can do to help with your psych management -- if you need new referral to a different provider  Cryotherapy performed today - we treat patients every two weeks for up to 6 to 10 weeks. If clearance has not been achieved within 6 to 10 weeks, we will find alternative treatment or send you to dermatology.  Take care,  Inda Coke [name]

## 2022-01-23 NOTE — Progress Notes (Signed)
[name] [name] is a 46 y.o. male here for a follow up on anxiety.   History of Present Illness:   Chief Complaint  Patient presents with   Follow-up    HPI   Anxiety/Depression  Patient was taking Xanax 5 mg (3 mg XR xanax and 2 mg Xanax) with no complications. States he is unable to refill Xanax and has not taken this since the past 3-4 days. States he has been feeling anxious. He has been doing well otherwise. States he was recently started on Gabapentin 600 mg QID daily by Dr. Toy Care, psychiatry, in order to bridge the gap between now and his refill which is available tomorrow. States he has been compliant with this regimen since last Wednesday on 01/14/2022. This has been working well for him. Denies SI/HI. He is working on weaning this slowly with Dr. Toy Care to come off of this.  On pre-exposure prophylaxis for HIV   Patient is currently taking Truvada 200-300 mg daily with complications. Has been doing well with this regimen. Denies any concerns.   Rash Has an area of very itchy redness at the top of is buttocks. Has applied mycolog to the area. Denies any fever, chills, etc.  Condyloma Has an area on his penis that has become more irritated lately. It has changed in color. Denies concerns for STI or discharge from penis.  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   COVID-19 06/2021   Crohn's disease ([place]) followed by dr Carlean Purl   small and large intestines--  hx bowel resection 02/ 2009--  currently treated w/ humira   Essential hypertension, benign    History of CMV 10/14/2010   resolved w/ antiviral therapy   History of kidney stones    History of resection of small bowel    Hyperlipidemia    Major depressive disorder    OSA (obstructive sleep apnea)    01-05-2018 per pt resolved since tonsils removed    Ventral hernia    Vitamin D deficiency       Social History   Tobacco Use   Smoking status: Never   Smokeless tobacco: Never  Vaping Use   Vaping Use: Never used  Substance Use Topics   Alcohol use: Yes    Alcohol/week: 3.0 standard drinks of alcohol    Types: 3 Shots of liquor per week   Drug use: No    Past Surgical History:  Procedure Laterality Date   ANAL FISTULOTOMY N/A 01/07/2018   Procedure: ANAL FISTULOTOMY;  Surgeon: Leighton Ruff, MD;  Location: [facility];  Service: General;  Laterality: N/A;   COLONOSCOPY  last one 04-2017   dr Carlean Purl   EXPLORATORY LAPAROTOMY W/ ILEOSECECTOMY W/ PRIMARY ANASTAMOSIS  08-05-2007    dr gross  [facility]   crohn's, sbo, perferation's   GANGLION CYST EXCISION Right 2016   wrist   HYPOSPADIAS CORRECTION  CHILD   SPHINCTEROTOMY N/A 01/07/2018   Procedure: CHEMICAL SPHINCTEROTOMY (BOTOX);  Surgeon: Leighton Ruff, MD;  Location: [facility];  Service: General;  Laterality: N/A;   TONSILLECTOMY Bilateral 09/30/2015   Procedure: BILATERAL TONSILLECTOMY;  Surgeon: Izora Gala, MD;  Location: [facility];  Service: ENT;  Laterality: Bilateral;   UPPER GASTROINTESTINAL ENDOSCOPY  10/15/2010   w/biopsy, mild gastritis and duodenitis    Family History  Problem Relation Age of Onset   Heart disease  Father        CAD/MI   Hyperlipidemia Father    Hypertension Father    Gout Father    Sleep apnea Father    Obesity Mother    Allergies Mother    Cancer Mother    Stroke Maternal Grandfather    Allergies Sister    Asthma Sister    Colon cancer Neg Hx    Diabetes Neg Hx    COPD Neg Hx    Colon polyps Neg Hx    Stomach cancer Neg Hx    Pancreatic cancer Neg Hx     No Known Allergies  Current Medications:   Current Outpatient Medications:    acetaminophen (TYLENOL) 325 MG tablet, Take 650 mg by mouth every 6 (six) hours as needed for moderate pain., Disp: , Rfl:    Adalimumab (HUMIRA PEN) 40 MG/0.4ML PNKT, INJECT 40 MG UNDER THE SKIN ONCE A WEEK, Disp: 12 each,  Rfl: 3   albuterol (VENTOLIN HFA) 108 (90 Base) MCG/ACT inhaler, Inhale 2 puffs into the lungs every 6 (six) hours as needed for wheezing or shortness of breath., Disp: 8 g, Rfl: 1   ALPRAZolam (XANAX XR) 3 MG 24 hr tablet, TAKE 1 TABLET BY MOUTH EVERY DAY IN THE MORNING, Disp: 30 tablet, Rfl: 2   alprazolam (XANAX) 2 MG tablet, TAKE 1/2 TAB BY MOUTH TWICE DAILY AS NEEDED., Disp: 30 tablet, Rfl: 2   busPIRone (BUSPAR) 30 MG tablet, Take 1 tablet by mouth in the morning and at bedtime., Disp: , Rfl:    cyanocobalamin (,VITAMIN B-12,) 1000 MCG/ML injection, INJECT 1 ML (1,000 MCG TOTAL) INTO THE MUSCLE EVERY 30 DAYS., Disp: 3 mL, Rfl: 3   Dextromethorphan-buPROPion ER (AUVELITY) 45-105 MG TBCR, Take 1 tablet by mouth in the morning and at bedtime., Disp: , Rfl:    emtricitabine-tenofovir (TRUVADA) 200-300 MG tablet, Take 1 tablet by mouth daily., Disp: 30 tablet, Rfl: 2   L-Methylfolate-Algae (DEPLIN 15 PO), Take 1 capsule by mouth daily. A medical food supplement, Disp: , Rfl:    lisinopril-hydrochlorothiazide (ZESTORETIC) 20-25 MG tablet, TAKE 1 TABLET BY MOUTH EVERY DAY, Disp: 90 tablet, Rfl: 2   nystatin-triamcinolone ointment (MYCOLOG), Apply 1 application topically 2 (two) times daily., Disp: 30 g, Rfl: 1   ondansetron (ZOFRAN) 4 MG tablet, TAKE 1 TAB BY MOUTH EVERY 6 HOURS (Patient taking differently: Take 4 mg by mouth every 6 (six) hours as needed for nausea or vomiting.), Disp: 30 tablet, Rfl: 1   QVAR REDIHALER 40 MCG/ACT inhaler, INHALE 2 PUFFS INTO THE LUNGS TWICE A DAY, Disp: 31.8 g, Rfl: 1   rosuvastatin (CRESTOR) 20 MG tablet, TAKE 1 TABLET BY MOUTH EVERY DAY, Disp: 90 tablet, Rfl: 0   zolpidem (AMBIEN) 10 MG tablet, TAKE 1 TABLET BY MOUTH EVERY DAY AT BEDTIME AS NEEDED FOR SLEEP, Disp: 30 tablet, Rfl: 5   diphenoxylate-atropine (LOMOTIL) 2.5-0.025 MG tablet, TAKE 1 TABLET BY MOUTH 4 TIMES A DAY AS NEEDED FOR DIARRHEA/LOOSE STOOLS (Patient not taking: Reported on 01/23/2022), Disp: 90  tablet, Rfl: 1  Current Facility-Administered Medications:    cyanocobalamin ((VITAMIN B-12)) injection 1,000 mcg, 1,000 mcg, Intramuscular, Q30 days, Gatha Mayer, MD, 1,000 mcg at 01/12/22 0816   Review of Systems:   ROS Negative unless otherwise specified per HPI.   Vitals:   Vitals:   01/23/22 1434  BP: 118/72  Pulse: 89  Temp: 98.3 F (36.8 C)  SpO2: 98%  Weight: 223 lb (101.2 kg)  Height: 5' 7"  (  1.702 m)     Body mass index is 34.93 kg/m.  Physical Exam:   Physical Exam Vitals and nursing note reviewed.  Constitutional:      General: He is not in acute distress.    Appearance: He is well-developed. He is not ill-appearing or toxic-appearing.  Cardiovascular:     Rate and Rhythm: Normal rate and regular rhythm.     Pulses: Normal pulses.     Heart sounds: Normal heart sounds, S1 normal and S2 normal.  Pulmonary:     Effort: Pulmonary effort is normal.     Breath sounds: Normal breath sounds.  Skin:    General: Skin is warm and dry.     Comments: 2 areas of well-demarcated erythema to gluteal cleft without induration or fluctuance  Wart-like lesion to left side of base of penis  Neurological:     Mental Status: He is alert.     GCS: GCS eye subscore is 4. GCS verbal subscore is 5. GCS motor subscore is 6.  Psychiatric:        Speech: Speech normal.        Behavior: Behavior normal. Behavior is cooperative.    Procedure: Cryotherapy  Consent:  Risks and benefits of therapy discussed with patient who voices understanding and agrees with planned care. No barriers to communication or understanding identified.  After obtaining informed consent, the patient's identity, procedure, and site were verified during a pause prior to proceeding with the minor surgical procedure as per universal protocol recommendations.    Meds, vitals, and allergies reviewed.  After appropriate cleansing, liquid nitrogen was applied to wart on patient's penis. Patient tolerated  procedure well without any complications.    Assessment and Plan:   On pre-exposure prophylaxis for HIV Update blood work and STI testing today Follow-up in 3 months, sooner if concerns  Anxiety with depression Uncontrolled Mgmt per psych Recommend that he reach out to [place] if needed Denies SI/HI  Rash Suspect possible tinea Apply ketoconazole to area Discussed keeping area dry when sweating  Condyloma Aftercare, including blister formation, risks of bleeding, and risks of recurrence were discussed. All questions answered.  Return for retreatment as needed for further evaluation and management.     I,Savera Zaman,acting as a Education administrator for [employer], PA.,have documented all relevant documentation on the behalf of Inda Coke, PA,as directed by  Inda Coke, PA while in the presence of Inda Coke, [place].   I, Inda Coke, [place], have reviewed all documentation for this visit. The documentation on 01/23/22 for the exam, diagnosis, procedures, and orders are all accurate and complete.   Inda Coke, [name]

## 2022-01-25 LAB — HIV ANTIBODY (ROUTINE TESTING W REFLEX): HIV 1&2 Ab, 4th Generation: NONREACTIVE

## 2022-01-25 LAB — RPR: RPR Ser Ql: NONREACTIVE

## 2022-01-26 ENCOUNTER — Other Ambulatory Visit (HOSPITAL_COMMUNITY): Payer: Self-pay

## 2022-01-26 ENCOUNTER — Encounter: Payer: Self-pay | Admitting: Physician Assistant

## 2022-01-26 LAB — URINE CYTOLOGY ANCILLARY ONLY
Chlamydia: NEGATIVE
Comment: NEGATIVE
Comment: NEGATIVE
Comment: NORMAL
Neisseria Gonorrhea: NEGATIVE
Trichomonas: NEGATIVE

## 2022-01-27 ENCOUNTER — Encounter: Payer: Self-pay | Admitting: Physician Assistant

## 2022-01-27 NOTE — Telephone Encounter (Signed)
Received a fax regarding Prior Authorization from [employer] for [place]. Authorization has been CANCELLED because CASE ID [id number] has an approval on file for 7.5.23 to 8.3.24 with additional quantify approval.

## 2022-02-09 ENCOUNTER — Other Ambulatory Visit (INDEPENDENT_AMBULATORY_CARE_PROVIDER_SITE_OTHER): Payer: BC Managed Care – PPO

## 2022-02-09 ENCOUNTER — Other Ambulatory Visit: Payer: Self-pay | Admitting: Physician Assistant

## 2022-02-09 ENCOUNTER — Ambulatory Visit: Payer: BC Managed Care – PPO | Admitting: Internal Medicine

## 2022-02-09 ENCOUNTER — Encounter: Payer: Self-pay | Admitting: Internal Medicine

## 2022-02-09 VITALS — BP 112/60 | HR 84 | Ht 67.0 in | Wt 220.2 lb

## 2022-02-09 DIAGNOSIS — K5080 Crohn's disease of both small and large intestine without complications: Secondary | ICD-10-CM

## 2022-02-09 DIAGNOSIS — R590 Localized enlarged lymph nodes: Secondary | ICD-10-CM

## 2022-02-09 DIAGNOSIS — E538 Deficiency of other specified B group vitamins: Secondary | ICD-10-CM | POA: Diagnosis not present

## 2022-02-09 DIAGNOSIS — L309 Dermatitis, unspecified: Secondary | ICD-10-CM

## 2022-02-09 DIAGNOSIS — R5383 Other fatigue: Secondary | ICD-10-CM | POA: Diagnosis not present

## 2022-02-09 DIAGNOSIS — Z79899 Other long term (current) drug therapy: Secondary | ICD-10-CM

## 2022-02-09 LAB — COMPREHENSIVE METABOLIC PANEL
ALT: 20 U/L (ref 0–53)
AST: 23 U/L (ref 0–37)
Albumin: 4.6 g/dL (ref 3.5–5.2)
Alkaline Phosphatase: 64 U/L (ref 39–117)
BUN: 13 mg/dL (ref 6–23)
CO2: 28 mEq/L (ref 19–32)
Calcium: 9.3 mg/dL (ref 8.4–10.5)
Chloride: 99 mEq/L (ref 96–112)
Creatinine, Ser: 1.19 mg/dL (ref 0.40–1.50)
GFR: 73.04 mL/min (ref >60.00)
Glucose, Bld: 85 mg/dL (ref 70–99)
Potassium: 3.6 mEq/L (ref 3.5–5.1)
Sodium: 133 mEq/L — ABNORMAL LOW (ref 135–145)
Total Bilirubin: 0.6 mg/dL (ref 0.2–1.2)
Total Protein: 7.5 g/dL (ref 6.0–8.3)

## 2022-02-09 LAB — CBC WITH DIFFERENTIAL/PLATELET
Basophils Absolute: 0.1 10*3/uL (ref 0.0–0.1)
Basophils Relative: 0.9 % (ref 0.0–3.0)
Eosinophils Absolute: 0.1 10*3/uL (ref 0.0–0.7)
Eosinophils Relative: 1.3 % (ref 0.0–5.0)
HCT: 44.8 % (ref 39.0–52.0)
Hemoglobin: 15 g/dL (ref 13.0–17.0)
Lymphocytes Relative: 35.6 % (ref 12.0–46.0)
Lymphs Abs: 3.4 10*3/uL (ref 0.7–4.0)
MCHC: 33.6 g/dL (ref 30.0–36.0)
MCV: 100.5 fl — ABNORMAL HIGH (ref 78.0–100.0)
Monocytes Absolute: 1 10*3/uL (ref 0.1–1.0)
Monocytes Relative: 10.8 % (ref 3.0–12.0)
Neutro Abs: 5 10*3/uL (ref 1.4–7.7)
Neutrophils Relative %: 51.4 % (ref 43.0–77.0)
Platelets: 232 10*3/uL (ref 150.0–400.0)
RBC: 4.45 Mil/uL (ref 4.22–5.81)
RDW: 14.2 % (ref 11.5–15.5)
WBC: 9.7 10*3/uL (ref 4.0–10.5)

## 2022-02-09 LAB — TSH: TSH: 2.17 u[IU]/mL (ref 0.35–5.50)

## 2022-02-09 NOTE — Progress Notes (Signed)
[name] [name] 46 y.o. [birth year] [id number]  Assessment & Plan:   Encounter Diagnoses  Name Primary?   Crohn's disease of both small and large intestine without complication (HCC) Yes   Fatigue, unspecified type    Submandibular lymphadenopathy - left    Perianal dermatitis     Lab work-up as below regarding fatigue and question of Crohn's disease status.  We did review the possibility of colonoscopy and have gone noninvasive first.   Orders Placed This Encounter  Procedures   Calprotectin, Fecal   Comprehensive metabolic panel   CBC with Differential/Platelet   TSH   He will monitor this lymph node and let me know if it persists or enlarges.  Add hydrocortisone cream to the Nizoral for the perianal dermatitis.  Consider a more potent steroid/antifungal combination pending the result.  He has been on Mycolog in the past.  CC: Morene Rankins, [place], [place]    Subjective:   Chief Complaint: Follow-up of Crohn's disease  HPI 46 year old white man with a history of Crohn's ileocolitis, status post hemicolectomy and terminal ileal resection, on weekly Humira, here because of bloating and abdominal discomfort intermittently.  Also fatigue.  For the past several months he has had multiple days out of the month where he feels "gurgly" and bloated and having more discomfort.  He did have a successful abdominal wall injection from Dr. Bryan Lemma and his right sided abdominal pain that he was having is gone.  He has not noted any bleeding no significant change in bowel habits they are often loose but they are controllable.  His last colonoscopy was in 2018 and he did have some inflammatory changes at his anastomosis.  He had a CT enterography in August 2021 with some thickening at the neoterminal ileum and mild mucosal hyperenhancement, he has known abdominal wall and hernias as well.  He had another CT of the abdomen pelvis, a standard 1 month after the August 2021 CT scan that was without  significant abnormality other than the hernias.  He has noticed a swollen lymph node below his left mandible.  He has had some postnasal drip type symptoms no fevers reported no other URI type problems.  He noticed that the other day so perhaps a few days ago.  It is not tender.  He has been treated with Nizoral for some pericoccygeal/perianal dermatitis.  He says it remains very pruritic.  Crohn's timeline/history Diagnosed 2005, has been on azathioprine and Remicade on and off On Remicade monotherapy now since 2009 right hemicolectomy (perforation) and recuuernt perianal fistula (controlled) Azathioprine restarted 11/2009. Due to data showing better outcomes on dual tx (biologic and immunomodulator).  Azathioprine discontinued May 2011 do to CMV infection. 05/2013 - has antibodies to infliximab 06/30/2013 initiating Humira November 2018 colonoscopy with inflammatory changes at the anastomosis, small bowel and colon were normal 07/2017 weekly Humira started Late 2021 flare treated with budesonide  Wt Readings from Last 3 Encounters:  02/09/22 220 lb 3.2 oz (99.9 kg)  01/23/22 223 lb (101.2 kg)  11/10/21 227 lb (103 kg)    No Known Allergies Current Meds  Medication Sig   acetaminophen (TYLENOL) 325 MG tablet Take 650 mg by mouth every 6 (six) hours as needed for moderate pain.   Adalimumab (HUMIRA PEN) 40 MG/0.4ML PNKT INJECT 40 MG UNDER THE SKIN ONCE A WEEK   albuterol (VENTOLIN HFA) 108 (90 Base) MCG/ACT inhaler Inhale 2 puffs into the lungs every 6 (six) hours as needed for wheezing or shortness of breath.  ALPRAZolam (XANAX XR) 3 MG 24 hr tablet TAKE 1 TABLET BY MOUTH EVERY DAY IN THE MORNING   alprazolam (XANAX) 2 MG tablet TAKE 1/2 TAB BY MOUTH TWICE DAILY AS NEEDED.   busPIRone (BUSPAR) 30 MG tablet Take 1 tablet by mouth in the morning and at bedtime.   cyanocobalamin (,VITAMIN B-12,) 1000 MCG/ML injection INJECT 1 ML (1,000 MCG TOTAL) INTO THE MUSCLE EVERY 30 DAYS.    Dextromethorphan-buPROPion ER (AUVELITY) 45-105 MG TBCR Take 1 tablet by mouth in the morning and at bedtime.   diphenoxylate-atropine (LOMOTIL) 2.5-0.025 MG tablet TAKE 1 TABLET BY MOUTH 4 TIMES A DAY AS NEEDED FOR DIARRHEA/LOOSE STOOLS   emtricitabine-tenofovir (TRUVADA) 200-300 MG tablet TAKE 1 TABLET BY MOUTH EVERY DAY   ketoconazole (NIZORAL) 2 % cream Apply 1 Application topically 2 (two) times daily.   L-Methylfolate-Algae (DEPLIN 15 PO) Take 1 capsule by mouth daily. A medical food supplement   lisinopril-hydrochlorothiazide (ZESTORETIC) 20-25 MG tablet TAKE 1 TABLET BY MOUTH EVERY DAY   nystatin-triamcinolone ointment (MYCOLOG) Apply 1 application topically 2 (two) times daily.   ondansetron (ZOFRAN) 4 MG tablet TAKE 1 TAB BY MOUTH EVERY 6 HOURS (Patient taking differently: Take 4 mg by mouth every 6 (six) hours as needed for nausea or vomiting.)   QVAR REDIHALER 40 MCG/ACT inhaler INHALE 2 PUFFS INTO THE LUNGS TWICE A DAY   rosuvastatin (CRESTOR) 20 MG tablet TAKE 1 TABLET BY MOUTH EVERY DAY   zolpidem (AMBIEN) 10 MG tablet TAKE 1 TABLET BY MOUTH EVERY DAY AT BEDTIME AS NEEDED FOR SLEEP   Current Facility-Administered Medications for the 02/09/22 encounter (Office Visit) with Gatha Mayer, MD  Medication   cyanocobalamin ((VITAMIN B-12)) injection 1,000 mcg   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   COVID-19 06/2021   Crohn's disease ([place]) followed by dr Carlean Purl   small and large intestines--  hx bowel resection 02/ 2009--  currently treated w/ humira   Essential hypertension, benign    History of CMV 10/14/2010   resolved w/ antiviral therapy   History of kidney stones    History of resection of small bowel    Hyperlipidemia    Major depressive disorder    OSA (obstructive sleep apnea)    01-05-2018 per pt resolved since tonsils removed    Ventral hernia     Vitamin D deficiency    Past Surgical History:  Procedure Laterality Date   ANAL FISTULOTOMY N/A 01/07/2018   Procedure: ANAL FISTULOTOMY;  Surgeon: Leighton Ruff, MD;  [facility];  Service: General;  Laterality: N/A;   COLONOSCOPY  last one 04-2017   dr Carlean Purl   EXPLORATORY LAPAROTOMY W/ ILEOSECECTOMY W/ PRIMARY ANASTAMOSIS  08-05-2007    dr gross  [facility]   crohn's, sbo, perferation's   GANGLION CYST EXCISION Right 2016   wrist   HYPOSPADIAS CORRECTION  CHILD   SPHINCTEROTOMY N/A 01/07/2018   Procedure: CHEMICAL SPHINCTEROTOMY (BOTOX);  Surgeon: Leighton Ruff, MD;  Location: [facility];  Service: General;  Laterality: N/A;   TONSILLECTOMY Bilateral 09/30/2015   Procedure: BILATERAL TONSILLECTOMY;  Surgeon: Izora Gala, MD;  Location: [place];  Service: ENT;  Laterality: Bilateral;   UPPER GASTROINTESTINAL ENDOSCOPY  10/15/2010   w/biopsy, mild gastritis and duodenitis   Social History   Social History Narrative   HSG, [place] - 2 years.  Married '99 - 54yr/divorced. 2 sons - '98, '00 split custody. 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 No history of physical or sexual abuse.    [employer]   family history includes Allergies in his mother and sister; Asthma in his sister; Cancer in his mother; Gout in his father; Heart disease in his father; Hyperlipidemia in his father; Hypertension in his father; Obesity in his mother; Sleep apnea in his father; Stroke in his maternal grandfather.   Review of Systems See HPI  Objective:   Physical Exam BP 112/60   Pulse 84   Ht 5' 7"  (1.702 m)   Wt 220 lb 3.2 oz (99.9 kg)   BMI 34.49 kg/m  Obese white man no acute distress Lungs are clear There is a very small mobile nontender lymph node that is slightly enlarged in the left submandibular area.  I detect no other cervical, supraclavicular postauricular adenopathy. Mouth and pharynx to the extent that is visible  is clear. Heart sounds normal S1-S2 no rubs murmurs or gallops The abdomen shows well-healed surgical scar,  fat-containing hernias are palpable but not tender, the abdomen itself is not tender.  Bowel sounds are present but not increased.  Inspection of the perirectal area shows some fairly intense and scaly erythema in and around the coccyx, there is no drainage

## 2022-02-09 NOTE — Patient Instructions (Addendum)
We have given you your monthly b12 injection today. You are scheduled for your next injection on 03/09/22 at 8:10 am.  _______________________________________________________  If you are age 47 or older, your body mass index should be between 23-30. Your Body mass index is 34.49 kg/m. If this is out of the aforementioned range listed, please consider follow up with your Primary Care Provider.  If you are age 74 or younger, your body mass index should be between 19-25. Your Body mass index is 34.49 kg/m. If this is out of the aformentioned range listed, please consider follow up with your Primary Care Provider.   ________________________________________________________  The [facility] providers would like to encourage you to use [facility] to communicate with providers for non-urgent requests or questions.  Due to long hold times on the telephone, sending your provider a message by [facility] may be a faster and more efficient way to get a response.  Please allow 48 business hours for a response.  Please remember that this is for non-urgent requests.  _______________________________________________________  Due to recent changes in healthcare laws, you may see the results of your imaging and laboratory studies on MyChart before your provider has had a chance to review them.  We understand that in some cases there may be results that are confusing or concerning to you. Not all laboratory results come back in the same time frame and the provider may be waiting for multiple results in order to interpret others.  Please give [place] 48 hours in order for your provider to thoroughly review all the results before contacting the office for clarification of your results.  ______________________________________________________  Your provider has requested that you go to the basement level for lab work before leaving today. Press "B" on the elevator. The lab is located at the first door on the left as you exit the  elevator.  Watch your enlarged lymph node and let us or your PCP know if it changes.  Try over the counter Hydrocortisone cream on your rash.  It was a pleasure to see you today!  Thank you for trusting me with your gastrointestinal care!    Dr.Gessner

## 2022-02-13 ENCOUNTER — Encounter: Payer: Self-pay | Admitting: Internal Medicine

## 2022-02-16 NOTE — Telephone Encounter (Signed)
Please see note from pt in regard to the place on side of neck that is tender and sensitive: I will forward message about Humira to our PA department:

## 2022-02-16 NOTE — Telephone Encounter (Signed)
Please see Letter sent to pt:

## 2022-02-17 ENCOUNTER — Telehealth: Payer: Self-pay | Admitting: Pharmacy Technician

## 2022-02-17 ENCOUNTER — Telehealth: Payer: Self-pay | Admitting: Internal Medicine

## 2022-02-17 ENCOUNTER — Other Ambulatory Visit (HOSPITAL_COMMUNITY): Payer: Self-pay

## 2022-02-17 NOTE — Telephone Encounter (Signed)
Current PA  on file expires 01/23/2023 for plan limit exceeded. This should cover his regular PA, however will submit new PA request and wait on decision from insurance. Pt recently filled a 3 month supply mail order 01/26/2022

## 2022-02-17 NOTE — Telephone Encounter (Signed)
Patient states he is returning your call.

## 2022-02-17 NOTE — Telephone Encounter (Signed)
Pt was notified that I forwarded his my chart message to Dr. Carlean Purl along with his Letter to PA dept:  Pt verbalized understanding with all questions answered.

## 2022-02-17 NOTE — Telephone Encounter (Signed)
Patient Advocate Encounter  Received notification that prior authorization for HUMIRIA  is required.   PA not submitted waiting for question to populate  Key [id number] Status is pending    [name] [name], [employer] Patient Advocate Phone: [phone]

## 2022-02-24 ENCOUNTER — Encounter: Payer: Self-pay | Admitting: Physician Assistant

## 2022-02-24 ENCOUNTER — Ambulatory Visit: Payer: BC Managed Care – PPO | Admitting: Physician Assistant

## 2022-02-24 VITALS — BP 124/80 | HR 79 | Temp 98.6°F | Ht 67.0 in | Wt 216.0 lb

## 2022-02-24 DIAGNOSIS — A630 Anogenital (venereal) warts: Secondary | ICD-10-CM | POA: Diagnosis not present

## 2022-02-24 DIAGNOSIS — J029 Acute pharyngitis, unspecified: Secondary | ICD-10-CM

## 2022-02-24 DIAGNOSIS — R591 Generalized enlarged lymph nodes: Secondary | ICD-10-CM

## 2022-02-24 DIAGNOSIS — Z23 Encounter for immunization: Secondary | ICD-10-CM | POA: Diagnosis not present

## 2022-02-24 DIAGNOSIS — F418 Other specified anxiety disorders: Secondary | ICD-10-CM | POA: Diagnosis not present

## 2022-02-24 LAB — POCT RAPID STREP A (OFFICE): Rapid Strep A Screen: NEGATIVE

## 2022-02-24 NOTE — Progress Notes (Signed)
[name] [name] is a 46 y.o. male here for a follow up of a pre-existing problem.  History of Present Illness:   Chief Complaint  Patient presents with   Adenopathy    Lymph node left side of neck.    HPI  Cervical lymphadenopathy Patient reports that he has noticed left-sided cervical lymphadenopathy for about 2 weeks now.  He denies any symptoms at first but for the past week and a half he has had sore throat, nasal congestion, cough.  He took Mucinex however this made his Crohn's significantly flare so he stopped this medication.  He has felt rundown but has not had any fever.  Denies: Neck stiffness, chest pain, shortness of breath, lethargy.  Anxiety with depression We transferred his psychiatric care to Dr. Robina Ade that was being done by his prior PCP.  She has instructed him to decrease his Xanax from 5 mg daily to 4.75 mg daily.  He states that with this small change of medication, he is feeling worsening anxiety and he is interested in coming off of this medication in a formal setting.  Denies suicidal or homicidal ideation but is having significant worsening anxiety.  Condyloma At his last visit we did some cryotherapy and this has improved the area that was frozen however he has had ongoing development of new areas and is wondering what can be done for this.  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   COVID-19 06/2021   Crohn's disease ([place]) followed by dr Carlean Purl   small and large intestines--  hx bowel resection 02/ 2009--  currently treated w/ humira   Essential hypertension, benign    History of CMV 10/14/2010   resolved w/ antiviral therapy   History of kidney stones    History of resection of small bowel    Hyperlipidemia    Major depressive disorder    OSA (obstructive sleep apnea)    01-05-2018 per pt resolved since tonsils removed    Ventral hernia     Vitamin D deficiency      Social History   Tobacco Use   Smoking status: Never   Smokeless tobacco: Never  Vaping Use   Vaping Use: Never used  Substance Use Topics   Alcohol use: Yes    Alcohol/week: 3.0 standard drinks of alcohol    Types: 3 Shots of liquor per week   Drug use: No    Past Surgical History:  Procedure Laterality Date   ANAL FISTULOTOMY N/A 01/07/2018   Procedure: ANAL FISTULOTOMY;  Surgeon: Leighton Ruff, MD;  [facility];  Service: General;  Laterality: N/A;   COLONOSCOPY  last one 04-2017   dr Carlean Purl   EXPLORATORY LAPAROTOMY W/ ILEOSECECTOMY W/ PRIMARY ANASTAMOSIS  08-05-2007    dr gross  [facility]   crohn's, sbo, perferation's   GANGLION CYST EXCISION Right 2016   wrist   HYPOSPADIAS CORRECTION  CHILD   SPHINCTEROTOMY N/A 01/07/2018   Procedure: CHEMICAL SPHINCTEROTOMY (BOTOX);  Surgeon: Leighton Ruff, MD;  Location: [facility];  Service: General;  Laterality: N/A;   TONSILLECTOMY Bilateral 09/30/2015   Procedure: BILATERAL TONSILLECTOMY;  Surgeon: Izora Gala, MD;  Location: [place];  Service: ENT;  Laterality: Bilateral;   UPPER GASTROINTESTINAL ENDOSCOPY  10/15/2010   w/biopsy, mild gastritis and duodenitis    Family History  Problem Relation Age of Onset  Heart disease Father        CAD/MI   Hyperlipidemia Father    Hypertension Father    Gout Father    Sleep apnea Father    Obesity Mother    Allergies Mother    Cancer Mother    Stroke Maternal Grandfather    Allergies Sister    Asthma Sister    Colon cancer Neg Hx    Diabetes Neg Hx    COPD Neg Hx    Colon polyps Neg Hx    Stomach cancer Neg Hx    Pancreatic cancer Neg Hx     No Known Allergies  Current Medications:   Current Outpatient Medications:    acetaminophen (TYLENOL) 325 MG tablet, Take 650 mg by mouth every 6 (six) hours as needed for moderate pain., Disp: , Rfl:    Adalimumab (HUMIRA PEN) 40 MG/0.4ML PNKT, INJECT 40 MG UNDER THE  SKIN ONCE A WEEK, Disp: 12 each, Rfl: 3   albuterol (VENTOLIN HFA) 108 (90 Base) MCG/ACT inhaler, Inhale 2 puffs into the lungs every 6 (six) hours as needed for wheezing or shortness of breath., Disp: 8 g, Rfl: 1   ALPRAZolam (XANAX XR) 3 MG 24 hr tablet, TAKE 1 TABLET BY MOUTH EVERY DAY IN THE MORNING, Disp: 30 tablet, Rfl: 2   alprazolam (XANAX) 2 MG tablet, TAKE 1/2 TAB BY MOUTH TWICE DAILY AS NEEDED., Disp: 30 tablet, Rfl: 2   busPIRone (BUSPAR) 30 MG tablet, Take 1 tablet by mouth in the morning and at bedtime., Disp: , Rfl:    cyanocobalamin (,VITAMIN B-12,) 1000 MCG/ML injection, INJECT 1 ML (1,000 MCG TOTAL) INTO THE MUSCLE EVERY 30 DAYS., Disp: 3 mL, Rfl: 3   Dextromethorphan-buPROPion ER (AUVELITY) 45-105 MG TBCR, Take 1 tablet by mouth in the morning and at bedtime., Disp: , Rfl:    diphenoxylate-atropine (LOMOTIL) 2.5-0.025 MG tablet, TAKE 1 TABLET BY MOUTH 4 TIMES A DAY AS NEEDED FOR DIARRHEA/LOOSE STOOLS, Disp: 90 tablet, Rfl: 1   emtricitabine-tenofovir (TRUVADA) 200-300 MG tablet, TAKE 1 TABLET BY MOUTH EVERY DAY, Disp: 90 tablet, Rfl: 0   ketoconazole (NIZORAL) 2 % cream, Apply 1 Application topically 2 (two) times daily., Disp: 30 g, Rfl: 0   L-Methylfolate-Algae (DEPLIN 15 PO), Take 1 capsule by mouth daily. A medical food supplement, Disp: , Rfl:    lisinopril-hydrochlorothiazide (ZESTORETIC) 20-25 MG tablet, TAKE 1 TABLET BY MOUTH EVERY DAY, Disp: 90 tablet, Rfl: 2   ondansetron (ZOFRAN) 4 MG tablet, TAKE 1 TAB BY MOUTH EVERY 6 HOURS (Patient taking differently: Take 4 mg by mouth every 6 (six) hours as needed for nausea or vomiting.), Disp: 30 tablet, Rfl: 1   QVAR REDIHALER 40 MCG/ACT inhaler, INHALE 2 PUFFS INTO THE LUNGS TWICE A DAY, Disp: 31.8 g, Rfl: 1   rosuvastatin (CRESTOR) 20 MG tablet, TAKE 1 TABLET BY MOUTH EVERY DAY, Disp: 90 tablet, Rfl: 0   zolpidem (AMBIEN) 10 MG tablet, TAKE 1 TABLET BY MOUTH EVERY DAY AT BEDTIME AS NEEDED FOR SLEEP, Disp: 30 tablet, Rfl:  5  Current Facility-Administered Medications:    cyanocobalamin ((VITAMIN B-12)) injection 1,000 mcg, 1,000 mcg, Intramuscular, Q30 days, Gatha Mayer, MD, 1,000 mcg at 02/09/22 1601   Review of Systems:   ROS Negative unless otherwise specified per HPI.  Vitals:   Vitals:   02/24/22 1105  BP: 124/80  Pulse: 79  Temp: 98.6 F (37 C)  TempSrc: Temporal  SpO2: 98%  Weight: 216 lb (98 kg)  Height: 5'  7" (1.702 m)     Body mass index is 33.83 kg/m.  Physical Exam:   Physical Exam Vitals and nursing note reviewed.  Constitutional:      General: He is not in acute distress.    Appearance: He is well-developed. He is not ill-appearing or toxic-appearing.  HENT:     Head: Normocephalic and atraumatic.     Right Ear: Tympanic membrane, ear canal and external ear normal. Tympanic membrane is not erythematous, retracted or bulging.     Left Ear: Tympanic membrane, ear canal and external ear normal. Tympanic membrane is not erythematous, retracted or bulging.     Nose: Nose normal.     Right Sinus: No maxillary sinus tenderness or frontal sinus tenderness.     Left Sinus: No maxillary sinus tenderness or frontal sinus tenderness.     Mouth/Throat:     Pharynx: Uvula midline. No posterior oropharyngeal erythema.  Eyes:     General: Lids are normal.     Conjunctiva/sclera: Conjunctivae normal.  Neck:     Trachea: Trachea normal.  Cardiovascular:     Rate and Rhythm: Normal rate and regular rhythm.     Pulses: Normal pulses.     Heart sounds: Normal heart sounds, S1 normal and S2 normal.  Pulmonary:     Effort: Pulmonary effort is normal.     Breath sounds: Normal breath sounds. No decreased breath sounds, wheezing, rhonchi or rales.  Lymphadenopathy:     Cervical: Cervical adenopathy present.     Left cervical: Superficial cervical adenopathy present.  Skin:    General: Skin is warm and dry.  Neurological:     Mental Status: He is alert.     GCS: GCS eye subscore is  4. GCS verbal subscore is 5. GCS motor subscore is 6.  Psychiatric:        Speech: Speech normal.        Behavior: Behavior normal. Behavior is cooperative.    Results for orders placed or performed in visit on 02/24/22  POCT rapid strep A  Result Value Ref Range   Rapid Strep A Screen Negative Negative    Assessment and Plan:   Anxiety with depression Worsening anxiety with Xanax wean Will trial sending patient to behavioral health urgent care for further evaluation to see if they can help with outpatient resources to help with further Xanax wean in a safe manner  Condyloma We were going to attempt additional cryotherapy but patient had a syncopal episode when he saw his blood, so this was deferred  We considered referral to dermatology but he does not want any lasering or other treatment at this time, he will let us know if he wants to pursue this  Lymphadenopathy Suspect that this is due to his illness Discussed that if this persists beyond 4 weeks, we will pursue imaging No red flags  Sorethroat No red flags on exam Strep test was negative Mono test was attempted but he passed out with side of blood, patient came to and was able to ambulate soon after coming to Continue watchful waiting He could be developing a sinus infection however due to his Crohn's he usually cannot tolerate antibiotics unless it is absolutely needed, so we are going to continue watchful waiting at this point He will contact us if he has worsening symptoms and we will likely add on Augmentin  Need for immunization against influenza Updated today  Inda Coke, [name]

## 2022-02-25 ENCOUNTER — Ambulatory Visit: Payer: BC Managed Care – PPO | Admitting: Physician Assistant

## 2022-02-25 ENCOUNTER — Other Ambulatory Visit: Payer: BC Managed Care – PPO

## 2022-02-25 DIAGNOSIS — K5080 Crohn's disease of both small and large intestine without complications: Secondary | ICD-10-CM

## 2022-02-26 ENCOUNTER — Telehealth: Payer: Self-pay

## 2022-02-26 NOTE — Telephone Encounter (Signed)
Patient Advocate Encounter   Received notification from [employer] that prior authorization is required for Humira Pen 11m/0.4mL. PA submitted and APPROVED on 02/26/2022.  Key [id number]: 01/27/2022 - 02/26/2023  SClista Bernhardt CPhT [facility] Patient Advocate Phone: [phone]

## 2022-02-26 NOTE — Telephone Encounter (Signed)
Dezmin called to report that [facility] is sending him bills again for his monthly B12 injections. He request I send him in syringes and needles and he will do it at home going forward. I have cancelled his upcoming B12 appointment. I called and gave a verbal to [facility] for his supplies.

## 2022-03-01 LAB — CALPROTECTIN, FECAL: Calprotectin, Fecal: 50 ug/g (ref 0–120)

## 2022-03-02 ENCOUNTER — Encounter: Payer: Self-pay | Admitting: Internal Medicine

## 2022-03-05 ENCOUNTER — Encounter: Payer: Self-pay | Admitting: Internal Medicine

## 2022-03-10 ENCOUNTER — Other Ambulatory Visit: Payer: Self-pay | Admitting: Physician Assistant

## 2022-03-10 ENCOUNTER — Encounter: Payer: Self-pay | Admitting: Physician Assistant

## 2022-03-10 ENCOUNTER — Telehealth: Payer: Self-pay | Admitting: Physician Assistant

## 2022-03-10 DIAGNOSIS — F418 Other specified anxiety disorders: Secondary | ICD-10-CM

## 2022-03-10 NOTE — Telephone Encounter (Signed)
..  Type of form received: short term disability   Additional comments:   Received by: [employer]   Form should be Faxed to: [phone]  Form should be mailed to:  na   Is patient requesting call for pickup: na    Form placed:   sams folder   Attach charge sheet.  Yes   Individual made aware of 3-5 business day turn around (Y/N)?    NA

## 2022-03-11 ENCOUNTER — Telehealth: Payer: Self-pay | Admitting: Physician Assistant

## 2022-03-11 NOTE — Telephone Encounter (Signed)
..  Type of form received: Attending physicians statement for short term  disbility   Additional comments:   Received by: [employer]   Form should be Faxed to: [phone]  Form should be mailed to:  na   Is patient requesting call for pickup: na    Form placed:   sam [employer] folder   Attach charge sheet.  Yes   Individual made aware of 3-5 business day turn around (Y/N)?   Yes

## 2022-03-12 NOTE — Telephone Encounter (Signed)
Left message on voicemail to call office.  

## 2022-03-12 NOTE — Telephone Encounter (Signed)
Pt called back, told him FMLA is completed did not know if you wanted me to fax or are you picking them up. Also you did not sign 1st page of FMLA. Pt said he would like [place] to fax and he will come by to sign paper today. Told him okay, once signed I will fax everything over. Pt verbalized understanding.

## 2022-03-12 NOTE — Telephone Encounter (Signed)
Pt came to the office and signed paper. FMLA faxed to [employer] at [phone].

## 2022-03-12 NOTE — Telephone Encounter (Signed)
Pt called back told him Physicians Statement forms are complete and I will fax them over. Pt verbalized understanding.

## 2022-03-12 NOTE — Telephone Encounter (Signed)
Pt notifed FMLA and 39 Statement forms are completed.

## 2022-03-12 NOTE — Telephone Encounter (Signed)
Physicians Statement for [place] and office notes faxed to [employer] at [phone].

## 2022-03-12 NOTE — Telephone Encounter (Signed)
Spoke to pt told him Aldona Bar said you can make an appt for Cryo therapy. Pt verbalized understanding and will schedule.

## 2022-03-14 ENCOUNTER — Other Ambulatory Visit: Payer: Self-pay | Admitting: Physician Assistant

## 2022-04-01 ENCOUNTER — Ambulatory Visit: Payer: BC Managed Care – PPO | Admitting: Physician Assistant

## 2022-04-01 ENCOUNTER — Other Ambulatory Visit (HOSPITAL_COMMUNITY)
Admission: RE | Admit: 2022-04-01 | Discharge: 2022-04-01 | Disposition: A | Discharge status: Home / Self Care | Payer: BC Managed Care – PPO | Source: Ambulatory Visit

## 2022-04-01 ENCOUNTER — Telehealth: Payer: Self-pay

## 2022-04-01 ENCOUNTER — Encounter: Payer: Self-pay | Admitting: Physician Assistant

## 2022-04-01 ENCOUNTER — Ambulatory Visit (HOSPITAL_BASED_OUTPATIENT_CLINIC_OR_DEPARTMENT_OTHER)
Admission: RE | Admit: 2022-04-01 | Discharge: 2022-04-01 | Disposition: A | Discharge status: Home / Self Care | Payer: BC Managed Care – PPO | Source: Ambulatory Visit | Attending: Physician Assistant | Admitting: Physician Assistant

## 2022-04-01 VITALS — BP 122/80 | HR 87 | Temp 97.8°F | Ht 67.0 in | Wt 221.0 lb

## 2022-04-01 DIAGNOSIS — A630 Anogenital (venereal) warts: Secondary | ICD-10-CM

## 2022-04-01 DIAGNOSIS — R5383 Other fatigue: Secondary | ICD-10-CM | POA: Diagnosis not present

## 2022-04-01 DIAGNOSIS — R591 Generalized enlarged lymph nodes: Secondary | ICD-10-CM | POA: Diagnosis present

## 2022-04-01 DIAGNOSIS — R590 Localized enlarged lymph nodes: Secondary | ICD-10-CM | POA: Insufficient documentation

## 2022-04-01 DIAGNOSIS — Z79899 Other long term (current) drug therapy: Secondary | ICD-10-CM

## 2022-04-01 DIAGNOSIS — K6289 Other specified diseases of anus and rectum: Secondary | ICD-10-CM

## 2022-04-01 LAB — CBC WITH DIFFERENTIAL/PLATELET
Basophils Absolute: 0.1 10*3/uL (ref 0.0–0.1)
Basophils Relative: 0.8 % (ref 0.0–3.0)
Eosinophils Absolute: 0.1 10*3/uL (ref 0.0–0.7)
Eosinophils Relative: 1.4 % (ref 0.0–5.0)
HCT: 46.2 % (ref 39.0–52.0)
Hemoglobin: 15.5 g/dL (ref 13.0–17.0)
Lymphocytes Relative: 28.9 % (ref 12.0–46.0)
Lymphs Abs: 2.5 10*3/uL (ref 0.7–4.0)
MCHC: 33.5 g/dL (ref 30.0–36.0)
MCV: 101.6 fl — ABNORMAL HIGH (ref 78.0–100.0)
Monocytes Absolute: 0.8 10*3/uL (ref 0.1–1.0)
Monocytes Relative: 9.3 % (ref 3.0–12.0)
Neutro Abs: 5.2 10*3/uL (ref 1.4–7.7)
Neutrophils Relative %: 59.6 % (ref 43.0–77.0)
Platelets: 249 10*3/uL (ref 150.0–400.0)
RBC: 4.55 Mil/uL (ref 4.22–5.81)
RDW: 13.7 % (ref 11.5–15.5)
WBC: 8.7 10*3/uL (ref 4.0–10.5)

## 2022-04-01 LAB — COMPREHENSIVE METABOLIC PANEL
ALT: 22 U/L (ref 0–53)
AST: 25 U/L (ref 0–37)
Albumin: 4.9 g/dL (ref 3.5–5.2)
Alkaline Phosphatase: 71 U/L (ref 39–117)
BUN: 15 mg/dL (ref 6–23)
CO2: 26 mEq/L (ref 19–32)
Calcium: 10.2 mg/dL (ref 8.4–10.5)
Chloride: 100 mEq/L (ref 96–112)
Creatinine, Ser: 1.24 mg/dL (ref 0.40–1.50)
GFR: 69.45 mL/min (ref >60.00)
Glucose, Bld: 85 mg/dL (ref 70–99)
Potassium: 4.1 mEq/L (ref 3.5–5.1)
Sodium: 135 mEq/L (ref 135–145)
Total Bilirubin: 0.5 mg/dL (ref 0.2–1.2)
Total Protein: 8.3 g/dL (ref 6.0–8.3)

## 2022-04-01 LAB — TSH: TSH: 1.8 u[IU]/mL (ref 0.35–5.50)

## 2022-04-01 NOTE — Telephone Encounter (Signed)
Pt was made aware of Dr. Carlean Purl recommendation to see pt tomorrow on 04/02/2022 at 11:30: Pt agreed: Pt was scheduled for an office visit with Dr. Carlean Purl 04/02/2022 at 11:30:   Pt made aware:  Pt verbalized understanding with all questions answered.

## 2022-04-01 NOTE — Patient Instructions (Addendum)
It was great to see you!  [facility] office will work you in for rectal eval tomorrow -- they will call you  Ultrasound of your neck will be ordered -- see below  Update blood work today  Referral to dermatology for further evaluation of your condyloma   Take care,  [name] Coke [name]

## 2022-04-01 NOTE — Progress Notes (Signed)
[name] [name] is a 46 y.o. male here for a follow up of a pre-existing problem.  History of Present Illness:   Chief Complaint  Patient presents with  . cryotherapy  . Lymphadenopathy    Pt c/o bilateral glans on neck swollen, worsening.    HPI  LAD; Fatigue Patient is complaining of swollen lymph nodes, his left side is persistent since mid-August and reports that the right side may be swollen as well, this occurred a couple weeks ago. When he was here in 02/24/22 and we ruled out strep throat. He has had fatigue since. Last blood panel done in August and this was unremarkable, however sx started after this.   Rectal pain Patient is complaining of an uncomfortable sensation in the rectal area that has been occurring for a month. Reports to wipe rectum clean and 45 mins later, rectum will become dirty again. Hx of fistula that presented the same way per his report. Denies fevers, chills, n/v/d. Sexually ac   Condyloma He reports an uncomfortable ongoing condyloma in his upper L groin area. Has had cryotherapy with me in the past x 1 for this, and it did help.   Pre-exposure prophylaxis for HIV Currently on Truvada Doing well on this Needs refill and testing today   Past Medical History:  Diagnosis Date  . Anal fissure and fistula    chronic  . Anxiety   . 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 COVID-19 06/2021  . Crohn's disease [facility]) followed by dr Carlean Purl   small and large intestines--  hx bowel resection 02/ 2009--  currently treated w/ humira  . Essential hypertension, benign   . History of CMV 10/14/2010   resolved w/ antiviral therapy  . History of kidney stones   . History of resection of small bowel   . Hyperlipidemia   . Major depressive disorder   . OSA (obstructive sleep apnea)    01-05-2018 per pt resolved since tonsils removed   . Ventral hernia   . Vitamin D deficiency      Social History   Tobacco Use  . Smoking  status: Never  . Smokeless tobacco: Never  Vaping Use  . Vaping Use: Never used  Substance Use Topics  . Alcohol use: Yes    Alcohol/week: 3.0 standard drinks of alcohol    Types: 3 Shots of liquor per week  . Drug use: No    Past Surgical History:  Procedure Laterality Date  . ANAL FISTULOTOMY N/A 01/07/2018   Procedure: ANAL FISTULOTOMY;  Surgeon: Leighton Ruff, MD;  Location: [facility];  Service: General;  Laterality: N/A;  . COLONOSCOPY  last one 04-2017   dr Carlean Purl  . EXPLORATORY LAPAROTOMY W/ ILEOSECECTOMY W/ PRIMARY ANASTAMOSIS  08-05-2007    dr gross  [facility]   crohn's, sbo, perferation's  . GANGLION CYST EXCISION Right 2016   wrist  . HYPOSPADIAS CORRECTION  CHILD  . SPHINCTEROTOMY N/A 01/07/2018   Procedure: CHEMICAL SPHINCTEROTOMY (BOTOX);  Surgeon: Leighton Ruff, MD;  Location: [facility];  Service: General;  Laterality: N/A;  . TONSILLECTOMY Bilateral 09/30/2015   Procedure: BILATERAL TONSILLECTOMY;  Surgeon: Izora Gala, MD;  Location: [place];  Service: ENT;  Laterality: Bilateral;  . UPPER GASTROINTESTINAL ENDOSCOPY  10/15/2010   w/biopsy, mild gastritis and duodenitis    Family History  Problem Relation Age of Onset  . Heart disease Father        CAD/MI  . Hyperlipidemia Father   .  Hypertension Father   . Gout Father   . Sleep apnea Father   . Obesity Mother   . Allergies Mother   . Cancer Mother   . Stroke Maternal Grandfather   . Allergies Sister   . Asthma Sister   . Colon cancer Neg Hx   . Diabetes Neg Hx   . COPD Neg Hx   . Colon polyps Neg Hx   . Stomach cancer Neg Hx   . Pancreatic cancer Neg Hx     No Known Allergies  Current Medications:   Current Outpatient Medications:  .  acetaminophen (TYLENOL) 325 MG tablet, Take 650 mg by mouth every 6 (six) hours as needed for moderate pain., Disp: , Rfl:  .  Adalimumab (HUMIRA PEN) 40 MG/0.4ML PNKT, INJECT 40 MG UNDER THE SKIN ONCE A WEEK, Disp: 12 each, Rfl: 3 .   albuterol (VENTOLIN HFA) 108 (90 Base) MCG/ACT inhaler, Inhale 2 puffs into the lungs every 6 (six) hours as needed for wheezing or shortness of breath., Disp: 8 g, Rfl: 1 .  ALPRAZolam (XANAX XR) 3 MG 24 hr tablet, TAKE 1 TABLET BY MOUTH EVERY DAY IN THE MORNING, Disp: 30 tablet, Rfl: 2 .  alprazolam (XANAX) 2 MG tablet, TAKE 1/2 TAB BY MOUTH TWICE DAILY AS NEEDED., Disp: 30 tablet, Rfl: 2 .  busPIRone (BUSPAR) 30 MG tablet, Take 1 tablet by mouth in the morning and at bedtime., Disp: , Rfl:  .  Dextromethorphan-buPROPion ER (AUVELITY) 45-105 MG TBCR, Take 1 tablet by mouth in the morning and at bedtime., Disp: , Rfl:  .  diphenoxylate-atropine (LOMOTIL) 2.5-0.025 MG tablet, TAKE 1 TABLET BY MOUTH 4 TIMES A DAY AS NEEDED FOR DIARRHEA/LOOSE STOOLS, Disp: 90 tablet, Rfl: 1 .  emtricitabine-tenofovir (TRUVADA) 200-300 MG tablet, TAKE 1 TABLET BY MOUTH EVERY DAY, Disp: 90 tablet, Rfl: 0 .  ketoconazole (NIZORAL) 2 % cream, Apply 1 Application topically 2 (two) times daily., Disp: 30 g, Rfl: 0 .  L-Methylfolate-Algae (DEPLIN 15 PO), Take 1 capsule by mouth daily. A medical food supplement, Disp: , Rfl:  .  lisinopril-hydrochlorothiazide (ZESTORETIC) 20-25 MG tablet, TAKE 1 TABLET BY MOUTH EVERY DAY, Disp: 90 tablet, Rfl: 2 .  ondansetron (ZOFRAN) 4 MG tablet, TAKE 1 TAB BY MOUTH EVERY 6 HOURS (Patient taking differently: Take 4 mg by mouth every 6 (six) hours as needed for nausea or vomiting.), Disp: 30 tablet, Rfl: 1 .  QVAR REDIHALER 40 MCG/ACT inhaler, INHALE 2 PUFFS INTO THE LUNGS TWICE A DAY, Disp: 31.8 g, Rfl: 1 .  rosuvastatin (CRESTOR) 20 MG tablet, TAKE 1 TABLET BY MOUTH EVERY DAY, Disp: 90 tablet, Rfl: 0 .  zolpidem (AMBIEN) 10 MG tablet, TAKE 1 TABLET BY MOUTH EVERY DAY AT BEDTIME AS NEEDED FOR SLEEP, Disp: 30 tablet, Rfl: 5 .  cyanocobalamin (,VITAMIN B-12,) 1000 MCG/ML injection, INJECT 1 ML (1,000 MCG TOTAL) INTO THE MUSCLE EVERY 30 DAYS. (Patient not taking: Reported on 04/01/2022), Disp: 3  mL, Rfl: 3  Current Facility-Administered Medications:  .  cyanocobalamin ((VITAMIN B-12)) injection 1,000 mcg, 1,000 mcg, Intramuscular, Q30 days, Gatha Mayer, MD, 1,000 mcg at 02/09/22 1601   Review of Systems:   Review of Systems  Constitutional:  Positive for malaise/fatigue.  HENT:         (+) right swollen lymph node  Musculoskeletal:  Positive for back pain.    Vitals:   Vitals:   04/01/22 1042  BP: 122/80  Pulse: 87  Temp: 97.8 F (36.6 C)  TempSrc: Temporal  SpO2: 95%  Weight: 221 lb (100.2 kg)  Height: 5' 7"  (1.702 m)     Body mass index is 34.61 kg/m.  Physical Exam:   Physical Exam Exam conducted with a chaperone present.  Constitutional:      General: He is not in acute distress.    Appearance: Normal appearance. He is not ill-appearing.  HENT:     Head: Normocephalic and atraumatic.     Right Ear: External ear normal.     Left Ear: External ear normal.  Eyes:     Extraocular Movements: Extraocular movements intact.     Pupils: Pupils are equal, round, and reactive to light.  Cardiovascular:     Rate and Rhythm: Normal rate and regular rhythm.     Heart sounds: Normal heart sounds. No murmur heard.    No gallop.  Pulmonary:     Effort: Pulmonary effort is normal. No respiratory distress.     Breath sounds: Normal breath sounds. No wheezing or rales.  Genitourinary:    Comments: Hyperpigmented wart to lower L groin region Small cluster of hyperpigmented wars to lower R groin region  Tenderness and erythema to rectum at 12 o'clock Lymphadenopathy:     Cervical: Cervical adenopathy present.     Right cervical: Superficial cervical adenopathy present.     Left cervical: Superficial cervical adenopathy present.  Skin:    General: Skin is warm and dry.  Neurological:     Mental Status: He is alert and oriented to person, place, and time.  Psychiatric:        Judgment: Judgment normal.    Procedure: Cryotherapy  Consent:  Risks and  benefits of therapy discussed with patient who voices understanding and agrees with planned care. No barriers to communication or understanding identified.  After obtaining informed consent, the patient's identity, procedure, and site were verified during a pause prior to proceeding with the minor surgical procedure as per universal protocol recommendations.    Meds, vitals, and allergies reviewed.  After appropriate cleansing, liquid nitrogen was applied to 3 warts on patient's groin. Patient tolerated procedure well without any complications.     Assessment and Plan:   Lymphadenopathy Given chronicity, will obtain u/s for further evaluation  Condyloma Cryotherapy performed Referral to dermatology Aftercare, including blister formation, risks of bleeding, and risks of recurrence were discussed. All questions answered.  Return for retreatment as needed for further evaluation and management.   Rectal pain Unclear etiology Appointment secured with GI tomorrow to follow-up on this No systemic symptoms Appreciate coordination of care  Fatigue, unspecified type Update blood work today Suspect possibly related to ongoing mental health concerns If new/worsening or ongoing symptoms, will pursue further evaluation Follow-up in 2 weeks  On pre-exposure prophylaxis for HIV Update STD testing to refill truvada   I, Luna Glasgow, acting as a Education administrator for [employer], PA.,have documented all relevant documentation on the behalf of Inda Coke, PA,as directed by  Inda Coke, PA while in the presence of Inda Coke, [place].  I, Inda Coke, [place], have reviewed all documentation for this visit. The documentation on 04/01/22 for the exam, diagnosis, procedures, and orders are all accurate and complete.   Inda Coke, [name]

## 2022-04-02 ENCOUNTER — Encounter: Payer: Self-pay | Admitting: Internal Medicine

## 2022-04-02 ENCOUNTER — Encounter (HOSPITAL_BASED_OUTPATIENT_CLINIC_OR_DEPARTMENT_OTHER): Payer: Self-pay

## 2022-04-02 ENCOUNTER — Ambulatory Visit (HOSPITAL_BASED_OUTPATIENT_CLINIC_OR_DEPARTMENT_OTHER)
Admission: RE | Admit: 2022-04-02 | Discharge: 2022-04-02 | Disposition: A | Discharge status: Home / Self Care | Payer: BC Managed Care – PPO | Source: Ambulatory Visit | Attending: Physician Assistant | Admitting: Physician Assistant

## 2022-04-02 ENCOUNTER — Other Ambulatory Visit: Payer: Self-pay | Admitting: Physician Assistant

## 2022-04-02 ENCOUNTER — Ambulatory Visit: Payer: BC Managed Care – PPO | Admitting: Internal Medicine

## 2022-04-02 VITALS — BP 110/80 | HR 69 | Ht 69.0 in | Wt 221.5 lb

## 2022-04-02 DIAGNOSIS — K50813 Crohn's disease of both small and large intestine with fistula: Secondary | ICD-10-CM

## 2022-04-02 DIAGNOSIS — Z7960 Long term (current) use of unspecified immunomodulators and immunosuppressants: Secondary | ICD-10-CM | POA: Diagnosis not present

## 2022-04-02 DIAGNOSIS — R221 Localized swelling, mass and lump, neck: Secondary | ICD-10-CM | POA: Insufficient documentation

## 2022-04-02 DIAGNOSIS — K602 Anal fissure, unspecified: Secondary | ICD-10-CM | POA: Diagnosis not present

## 2022-04-02 DIAGNOSIS — E538 Deficiency of other specified B group vitamins: Secondary | ICD-10-CM | POA: Diagnosis not present

## 2022-04-02 DIAGNOSIS — K603 Anal fistula: Secondary | ICD-10-CM

## 2022-04-02 LAB — URINE CYTOLOGY ANCILLARY ONLY
Chlamydia: NEGATIVE
Comment: NEGATIVE
Comment: NEGATIVE
Comment: NORMAL
Neisseria Gonorrhea: NEGATIVE
Trichomonas: NEGATIVE

## 2022-04-02 LAB — RPR: RPR Ser Ql: NONREACTIVE

## 2022-04-02 LAB — HIV ANTIBODY (ROUTINE TESTING W REFLEX): HIV 1&2 Ab, 4th Generation: NONREACTIVE

## 2022-04-02 MED ORDER — VITAMIN B-12 1000 MCG PO TABS: 1000.0000 ug | ORAL_TABLET | Freq: Every day | ORAL | Status: AC

## 2022-04-02 MED ORDER — IOHEXOL 300 MG/ML  SOLN
100.0000 mL | Freq: Once | INTRAMUSCULAR | Status: AC | PRN
  Administered 2022-04-02: 75 mL via INTRAVENOUS

## 2022-04-02 MED ORDER — AMBULATORY NON FORMULARY MEDICATION: 3 refills | Status: DC

## 2022-04-02 NOTE — Patient Instructions (Addendum)
Your provider has prescribed Dilitzem/Lidocaine gel for you. Please follow the directions written on your prescription bottle or given to you specifically by your provider. Since this is a specialty medication and is not readily available at most local pharmacies, we have sent your prescription to:  [facility] information is below: Address: [address] [place], [place] [address]  [phone]  *Please DO NOT go directly from our office to pick up this medication! Give the pharmacy 1 day to process the prescription as this is compounded and takes time to make.   You will be contacted by [facility] in the next 2 days to arrange a MRI pelvis..  The number on your caller ID will be [phone], please answer when they call.  If you have not heard from them in 2 days please call [phone] to schedule.    You have been scheduled for an MRI at __________________ on _______________. Your appointment time is __________________. Please arrive to admitting (at main entrance of the hospital) 15 minutes prior to your appointment time for registration purposes. Please make certain not to have anything to eat or drink 6 hours prior to your test. In addition, if you have any metal in your body, have a pacemaker or defibrillator, please be sure to let your ordering physician know. This test typically takes 45 minutes to 1 hour to complete. Should you need to reschedule, please call [phone] to do so.  Per Dr Carlean Purl take 1045mg of the B12 daily.  I appreciate the opportunity to care for you. CSilvano Rusk MD, FShoreline Asc Inc-

## 2022-04-02 NOTE — Progress Notes (Signed)
[name] [name] 46 y.o. 06/17/1976 [id number]  Assessment & Plan:   Encounter Diagnoses  Name Primary?   Anal fissure and fistula Yes   Crohn's disease of both small and large intestine with fistula (HCC)    Long-term use of immunosuppressant medication - Humira    Vitamin B12 deficiency    He seems to have a fissure and a small fistula, posterior.  I am going to treat with diltiazem 2% lidocaine 5% cream 2-3 times a day and get an MRI of the pelvis to try to understand this better.  Could need colorectal surgical evaluation pending that.  Consider adalimumab antibodies and drug levels.   Trial of oral B12 given the cost of his injections.  He will need follow-up testing with his B12 level in 4 to 6 months.  I suspect he could have every 40-monthinjections as an option as well.  CC: WInda Coke PA   Subjective:   Chief Complaint: Rectal pain fistula question  HPI The patient is a 46year old white man with a history of Crohn's disease of the small and large intestine, status post ileocecal resection, on Humira weekly, B12 deficiency and prior history of anal fissure and fistula who presents with a several week history of rectal pain.  There is intermittent soiling some time after defecation.  Small amounts of feces that leak.  No purulence detected no fevers no particular swelling though the area in the posterior rectum is tender.  He does have pain with and after defecation.  He was seen at primary care yesterday and referred for evaluation.  He is not having any bleeding.  His bowel habits are "normal".  Stable without significant diarrhea.  He has not had any fever.  Its not painful to sit.  He had a perianal dermatitis when seen in August though that has resolved he says.  Due to a high co-pay on B12 injections he wants to explore alternatives i.e. oral.  He considered self administration but says he cannot do it.   He is currently taking some time off work and will be  returning at the end of the month.  Lab Results  Component Value Date   [id number] 37112/15/2022   Absolute  No Known Allergies Current Meds  Medication Sig   acetaminophen (TYLENOL) 325 MG tablet Take 650 mg by mouth every 6 (six) hours as needed for moderate pain.   Adalimumab (HUMIRA PEN) 40 MG/0.4ML PNKT INJECT 40 MG UNDER THE SKIN ONCE A WEEK   albuterol (VENTOLIN HFA) 108 (90 Base) MCG/ACT inhaler Inhale 2 puffs into the lungs every 6 (six) hours as needed for wheezing or shortness of breath.   ALPRAZolam (XANAX XR) 3 MG 24 hr tablet TAKE 1 TABLET BY MOUTH EVERY DAY IN THE MORNING   alprazolam (XANAX) 2 MG tablet TAKE 1/2 TAB BY MOUTH TWICE DAILY AS NEEDED.   busPIRone (BUSPAR) 30 MG tablet Take 1 tablet by mouth in the morning and at bedtime.   cyanocobalamin (,VITAMIN B-12,) 1000 MCG/ML injection INJECT 1 ML (1,000 MCG TOTAL) INTO THE MUSCLE EVERY 30 DAYS.   Dextromethorphan-buPROPion ER (AUVELITY) 45-105 MG TBCR Take 1 tablet by mouth in the morning and at bedtime.   diphenoxylate-atropine (LOMOTIL) 2.5-0.025 MG tablet TAKE 1 TABLET BY MOUTH 4 TIMES A DAY AS NEEDED FOR DIARRHEA/LOOSE STOOLS   emtricitabine-tenofovir (TRUVADA) 200-300 MG tablet TAKE 1 TABLET BY MOUTH EVERY DAY   ketoconazole (NIZORAL) 2 % cream Apply 1 Application topically 2 (two) times  daily.   L-Methylfolate-Algae (DEPLIN 15 PO) Take 1 capsule by mouth daily. A medical food supplement   lisinopril-hydrochlorothiazide (ZESTORETIC) 20-25 MG tablet TAKE 1 TABLET BY MOUTH EVERY DAY   ondansetron (ZOFRAN) 4 MG tablet TAKE 1 TAB BY MOUTH EVERY 6 HOURS (Patient taking differently: Take 4 mg by mouth every 6 (six) hours as needed for nausea or vomiting.)   QVAR REDIHALER 40 MCG/ACT inhaler INHALE 2 PUFFS INTO THE LUNGS TWICE A DAY   rosuvastatin (CRESTOR) 20 MG tablet TAKE 1 TABLET BY MOUTH EVERY DAY   zolpidem (AMBIEN) 10 MG tablet TAKE 1 TABLET BY MOUTH EVERY DAY AT BEDTIME AS NEEDED FOR SLEEP   Current  Facility-Administered Medications for the 04/02/22 encounter (Office Visit) with Gatha Mayer, MD  Medication   cyanocobalamin ((VITAMIN B-12)) injection 1,000 mcg   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   COVID-19 06/2021   Crohn's disease ([place]) followed by dr Carlean Purl   small and large intestines--  hx bowel resection 02/ 2009--  currently treated w/ humira   Essential hypertension, benign    History of CMV 10/14/2010   resolved w/ antiviral therapy   History of kidney stones    History of resection of small bowel    Hyperlipidemia    Major depressive disorder    OSA (obstructive sleep apnea)    01-05-2018 per pt resolved since tonsils removed    Ventral hernia    Vitamin D deficiency    Past Surgical History:  Procedure Laterality Date   ANAL FISTULOTOMY N/A 01/07/2018   Procedure: ANAL FISTULOTOMY;  Surgeon: Leighton Ruff, MD;  Location[facility];  Service: General;  Laterality: N/A;   COLONOSCOPY  last one 04-2017   dr Carlean Purl   EXPLORATORY LAPAROTOMY W/ ILEOSECECTOMY W/ PRIMARY ANASTAMOSIS  08-05-2007    dr gross  [facility]   crohn's, sbo, perferation's   GANGLION CYST EXCISION Right 2016   wrist   HYPOSPADIAS CORRECTION  CHILD   SPHINCTEROTOMY N/A 01/07/2018   Procedure: CHEMICAL SPHINCTEROTOMY (BOTOX);  Surgeon: Leighton Ruff, MD;  Location: [facility];  Service: General;  Laterality: N/A;   TONSILLECTOMY Bilateral 09/30/2015   Procedure: BILATERAL TONSILLECTOMY;  Surgeon: Izora Gala, MD;  Location: [place];  Service: ENT;  Laterality: Bilateral;   UPPER GASTROINTESTINAL ENDOSCOPY  10/15/2010   w/biopsy, mild gastritis and duodenitis   Social History   Social History Narrative   HSG, [place] - 2 years. Married '99 - 36yr/divorced. 2 sons - '98, '00 split custody. Work - cFinancial planner Lives with partner No  exercise. No history of physical or sexual abuse.    WAgustina CaroliMortgage   family history includes Allergies in his mother and sister; Asthma in his sister; Cancer in his mother; Gout in his father; Heart disease in his father; Hyperlipidemia in his father; Hypertension in his father; Obesity in his mother; Sleep apnea in his father; Stroke in his maternal grandfather.   Review of Systems   Objective:   Physical Exam BP 110/80   Pulse 69   Ht 5' 9"  (1.753 m)   Wt 221 lb 8 oz (100.5 kg)   BMI 32.71 kg/m  Obese white man in no acute distress Rectal exam: Prior perianal dermatitis has resolved On inspection there is a pin point hole at the posterior area that is somewhat tender  but I do not detect any underlying abscess though there is some induration.  I could not express any fluid though there is a slight amount of feces around this suspected fistula opening    Digital exam is somewhat tender with induration in the posterior anal canal.  Anoscopy is performed and does reveal a posterior anal fissure

## 2022-04-09 ENCOUNTER — Other Ambulatory Visit: Payer: Self-pay | Admitting: Physician Assistant

## 2022-04-14 ENCOUNTER — Ambulatory Visit (HOSPITAL_COMMUNITY)
Admission: RE | Admit: 2022-04-14 | Discharge: 2022-04-14 | Disposition: A | Discharge status: Home / Self Care | Payer: BC Managed Care – PPO | Source: Ambulatory Visit | Attending: Internal Medicine | Admitting: Internal Medicine

## 2022-04-14 DIAGNOSIS — K50813 Crohn's disease of both small and large intestine with fistula: Secondary | ICD-10-CM | POA: Insufficient documentation

## 2022-04-14 DIAGNOSIS — K603 Anal fistula: Secondary | ICD-10-CM | POA: Diagnosis present

## 2022-04-14 DIAGNOSIS — K602 Anal fissure, unspecified: Secondary | ICD-10-CM | POA: Insufficient documentation

## 2022-04-14 MED ORDER — GADOBUTROL 1 MMOL/ML IV SOLN
10.0000 mL | Freq: Once | INTRAVENOUS | Status: AC | PRN
  Administered 2022-04-14: 10 mL via INTRAVENOUS

## 2022-04-15 ENCOUNTER — Encounter: Payer: Self-pay | Admitting: Internal Medicine

## 2022-04-15 ENCOUNTER — Encounter: Payer: Self-pay | Admitting: Physician Assistant

## 2022-04-15 ENCOUNTER — Ambulatory Visit: Payer: BC Managed Care – PPO | Admitting: Physician Assistant

## 2022-04-15 ENCOUNTER — Other Ambulatory Visit: Payer: Self-pay | Admitting: Internal Medicine

## 2022-04-15 VITALS — BP 110/80 | HR 77 | Temp 98.0°F | Ht 69.0 in | Wt 218.4 lb

## 2022-04-15 DIAGNOSIS — R93811 Abnormal radiologic findings on diagnostic imaging of right testicle: Secondary | ICD-10-CM

## 2022-04-15 DIAGNOSIS — A630 Anogenital (venereal) warts: Secondary | ICD-10-CM

## 2022-04-15 DIAGNOSIS — K602 Anal fissure, unspecified: Secondary | ICD-10-CM | POA: Diagnosis not present

## 2022-04-15 DIAGNOSIS — J4540 Moderate persistent asthma, uncomplicated: Secondary | ICD-10-CM

## 2022-04-15 MED ORDER — FLUTICASONE PROPIONATE HFA 110 MCG/ACT IN AERO: 2.0000 | INHALATION_SPRAY | Freq: Two times a day (BID) | RESPIRATORY_TRACT | 1 refills | Status: DC

## 2022-04-15 MED ORDER — QVAR REDIHALER 80 MCG/ACT IN AERB: 2.0000 | INHALATION_SPRAY | Freq: Two times a day (BID) | RESPIRATORY_TRACT | 1 refills | Status: DC

## 2022-04-15 MED ORDER — MONTELUKAST SODIUM 10 MG PO TABS: 10.0000 mg | ORAL_TABLET | Freq: Every day | ORAL | 1 refills | Status: DC

## 2022-04-15 MED ORDER — LEVOFLOXACIN 500 MG PO TABS: 500.0000 mg | ORAL_TABLET | Freq: Every day | ORAL | 0 refills | Status: AC

## 2022-04-15 NOTE — Patient Instructions (Signed)
It was great to see you!  Pick up the inhaler that is cheapest and start this If you are still needing increased use of albuterol, let me know Start singulair 10 mg daily to help with congestion/wheezing  Urology referral placed  Please bring short term disability paperwork and we will addend  Take care,  Inda Coke [name]

## 2022-04-15 NOTE — Progress Notes (Signed)
[name] [name] is a 46 y.o. male here for a follow up of a pre-existing problem.  History of Present Illness:   Chief Complaint  Patient presents with   cryo therapy   Discuss Short term disability    HPI  Anal Fissure MRI done yesterday. Worsened pain. Started on antibiotic by GI. Referred for surgery consult.  MRI also found abnormal R testicle MRI Pelvis 04/14/2022: Small mildly heterogeneous appearing RIGHT testis is incompletely assessed and resides within the lower inguinal canal as seen on prior imaging. This may be the result of prior trauma or cryptorchid testis. Given the possibility of increased risk of testicular neoplasm would suggest correlation with prior imaging of this area if performed or follow-up scrotal sonogram if not performed previously.  This is a known issue per patient, has been present all his life. He has been told to see urology in the past but he has never been. He is agreeable to referral today.  Asthma Feels as if his chest is tight and heavy. Has a severe productive cough, worse at night. Accompanying nasal congestion. Using daily antihistamine. Uses Qvar inhaler 2 puffs in the morning and 2 puffs at night. No longer controlling symptoms. Increasing use of Albuterol inhaler recently. Denies fever and chills. Denies any chest pain.  Condyloma He has an uncomfortable ongoing condyloma in his upper L groin area. Has had cryotherapy with me in the past. Last was on 04/01/22. Has an appt with dermatology next week to further evaluate.   Due to multiple medical issues and need for appt with specialists, he would like to extend his STD for a few more weeks.  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   COVID-19 06/2021   Crohn's disease ([place]) followed by dr Carlean Purl   small and large intestines--  hx bowel resection 02/ 2009--   currently treated w/ humira   Essential hypertension, benign    History of CMV 10/14/2010   resolved w/ antiviral therapy   History of kidney stones    History of resection of small bowel    Hyperlipidemia    Major depressive disorder    OSA (obstructive sleep apnea)    01-05-2018 per pt resolved since tonsils removed    Ventral hernia    Vitamin D deficiency      Social History   Tobacco Use   Smoking status: Never   Smokeless tobacco: Never  Vaping Use   Vaping Use: Never used  Substance Use Topics   Alcohol use: Yes    Alcohol/week: 3.0 standard drinks of alcohol    Types: 3 Shots of liquor per week   Drug use: No    Past Surgical History:  Procedure Laterality Date   ANAL FISTULOTOMY N/A 01/07/2018   Procedure: ANAL FISTULOTOMY;  Surgeon: Leighton Ruff, MD;  Location: [facility];  Service: General;  Laterality: N/A;   COLONOSCOPY  last one 04-2017   dr Carlean Purl   EXPLORATORY LAPAROTOMY W/ ILEOSECECTOMY W/ PRIMARY ANASTAMOSIS  08-05-2007    dr gross  [facility]   crohn's, sbo, perferation's   GANGLION CYST EXCISION Right 2016   wrist   HYPOSPADIAS CORRECTION  CHILD   SPHINCTEROTOMY N/A 01/07/2018   Procedure: CHEMICAL SPHINCTEROTOMY (BOTOX);  Surgeon: Leighton Ruff, MD;  Location: [facility];  Service: General;  Laterality: N/A;   TONSILLECTOMY Bilateral 09/30/2015  Procedure: BILATERAL TONSILLECTOMY;  Surgeon: Izora Gala, MD;  Location[facility] OR;  Service: ENT;  Laterality: Bilateral;   UPPER GASTROINTESTINAL ENDOSCOPY  10/15/2010   w/biopsy, mild gastritis and duodenitis    Family History  Problem Relation Age of Onset   Heart disease Father        CAD/MI   Hyperlipidemia Father    Hypertension Father    Gout Father    Sleep apnea Father    Obesity Mother    Allergies Mother    Cancer Mother    Stroke Maternal Grandfather    Allergies Sister    Asthma Sister    Colon cancer Neg Hx    Diabetes Neg Hx    COPD Neg Hx    Colon  polyps Neg Hx    Stomach cancer Neg Hx    Pancreatic cancer Neg Hx     No Known Allergies  Current Medications:   Current Outpatient Medications:    acetaminophen (TYLENOL) 325 MG tablet, Take 650 mg by mouth every 6 (six) hours as needed for moderate pain., Disp: , Rfl:    Adalimumab (HUMIRA PEN) 40 MG/0.4ML PNKT, INJECT 40 MG UNDER THE SKIN ONCE A WEEK, Disp: 12 each, Rfl: 3   albuterol (VENTOLIN HFA) 108 (90 Base) MCG/ACT inhaler, TAKE 2 PUFFS BY MOUTH EVERY 6 HOURS AS NEEDED FOR WHEEZE OR SHORTNESS OF BREATH, Disp: 8.5 each, Rfl: 1   ALPRAZolam (XANAX XR) 3 MG 24 hr tablet, TAKE 1 TABLET BY MOUTH EVERY DAY IN THE MORNING, Disp: 30 tablet, Rfl: 2   alprazolam (XANAX) 2 MG tablet, TAKE 1/2 TAB BY MOUTH TWICE DAILY AS NEEDED., Disp: 30 tablet, Rfl: 2   AMBULATORY NON FORMULARY MEDICATION, Dilitazem 2% mixed with Lidocaine 5% Sig: apply a pea size amount to rectum 2-3 times a day, Disp: 30 g, Rfl: 3   beclomethasone (QVAR REDIHALER) 80 MCG/ACT inhaler, Inhale 2 puffs into the lungs 2 (two) times daily., Disp: 3 each, Rfl: 1   busPIRone (BUSPAR) 30 MG tablet, Take 1 tablet by mouth in the morning and at bedtime., Disp: , Rfl:    cyanocobalamin (VITAMIN B12) 1000 MCG tablet, Take 1 tablet (1,000 mcg total) by mouth daily., Disp: , Rfl:    Dextromethorphan-buPROPion ER (AUVELITY) 45-105 MG TBCR, Take 1 tablet by mouth in the morning and at bedtime., Disp: , Rfl:    diphenoxylate-atropine (LOMOTIL) 2.5-0.025 MG tablet, TAKE 1 TABLET BY MOUTH 4 TIMES A DAY AS NEEDED FOR DIARRHEA/LOOSE STOOLS, Disp: 90 tablet, Rfl: 1   emtricitabine-tenofovir (TRUVADA) 200-300 MG tablet, TAKE 1 TABLET BY MOUTH EVERY DAY, Disp: 90 tablet, Rfl: 0   fluticasone (FLOVENT HFA) 110 MCG/ACT inhaler, Inhale 2 puffs into the lungs in the morning and at bedtime., Disp: 3 each, Rfl: 1   ketoconazole (NIZORAL) 2 % cream, Apply 1 Application topically 2 (two) times daily., Disp: 30 g, Rfl: 0   L-Methylfolate-Algae (DEPLIN 15  PO), Take 1 capsule by mouth daily. A medical food supplement, Disp: , Rfl:    lisinopril-hydrochlorothiazide (ZESTORETIC) 20-25 MG tablet, TAKE 1 TABLET BY MOUTH EVERY DAY, Disp: 90 tablet, Rfl: 2   montelukast (SINGULAIR) 10 MG tablet, Take 1 tablet (10 mg total) by mouth at bedtime., Disp: 90 tablet, Rfl: 1   ondansetron (ZOFRAN) 4 MG tablet, TAKE 1 TAB BY MOUTH EVERY 6 HOURS (Patient taking differently: Take 4 mg by mouth every 6 (six) hours as needed for nausea or vomiting.), Disp: 30 tablet, Rfl: 1   rosuvastatin (CRESTOR)  20 MG tablet, TAKE 1 TABLET BY MOUTH EVERY DAY, Disp: 90 tablet, Rfl: 0   zolpidem (AMBIEN) 10 MG tablet, TAKE 1 TABLET BY MOUTH EVERY DAY AT BEDTIME AS NEEDED FOR SLEEP, Disp: 30 tablet, Rfl: 5   levofloxacin (LEVAQUIN) 500 MG tablet, Take 1 tablet (500 mg total) by mouth daily for 10 days., Disp: 10 tablet, Rfl: 0   Review of Systems:   Review of Systems  Constitutional:  Negative for chills, fever, malaise/fatigue and weight loss.  HENT:  Positive for congestion. Negative for hearing loss, sinus pain and sore throat.   Respiratory:  Positive for cough. Negative for hemoptysis.        + chest tightness  Cardiovascular:  Negative for chest pain, palpitations, leg swelling and PND.  Gastrointestinal:  Negative for abdominal pain, constipation, diarrhea, heartburn, nausea and vomiting.  Genitourinary:  Negative for dysuria, frequency and urgency.  Musculoskeletal:  Negative for back pain, myalgias and neck pain.  Skin:  Negative for itching and rash.  Neurological:  Negative for dizziness, tingling, seizures and headaches.  Endo/Heme/Allergies:  Negative for polydipsia.  Psychiatric/Behavioral:  Negative for depression. The patient is not nervous/anxious.     Vitals:   Vitals:   04/15/22 0913  BP: 110/80  Pulse: 77  Temp: 98 F (36.7 C)  TempSrc: Temporal  SpO2: 96%  Weight: 218 lb 6.1 oz (99.1 kg)  Height: 5' 9"  (1.753 m)     Body mass index is 32.25  kg/m.  Physical Exam:   Physical Exam Exam conducted with a chaperone present.  Constitutional:      General: He is not in acute distress.    Appearance: Normal appearance. He is not ill-appearing.  HENT:     Head: Normocephalic and atraumatic.     Right Ear: External ear normal.     Left Ear: External ear normal.  Eyes:     Extraocular Movements: Extraocular movements intact.     Pupils: Pupils are equal, round, and reactive to light.  Cardiovascular:     Rate and Rhythm: Normal rate and regular rhythm.     Heart sounds: Normal heart sounds. No murmur heard.    No gallop.  Pulmonary:     Effort: Pulmonary effort is normal. No respiratory distress.     Breath sounds: Normal breath sounds. No wheezing or rales.  Genitourinary:    Comments: Hyperpigmented wart to lower L groin region    Skin:    General: Skin is warm and dry.  Neurological:     Mental Status: He is alert and oriented to person, place, and time.  Psychiatric:        Judgment: Judgment normal.    Procedure: Cryotherapy  Consent:  Risks and benefits of therapy discussed with patient who voices understanding and agrees with planned care. No barriers to communication or understanding identified.  After obtaining informed consent, the patient's identity, procedure, and site were verified during a pause prior to proceeding with the minor surgical procedure as per universal protocol recommendations.    Meds, vitals, and allergies reviewed.  After appropriate cleansing, liquid nitrogen was applied to wart on patient's L groin. Patient tolerated procedure well without any complications.  Aftercare, including blister formation, risks of bleeding, and risks of recurrence were discussed. All questions answered.  Return for retreatment as needed for further evaluation and management.    Assessment and Plan:   Moderate persistent asthma, unspecified whether complicated Uncontrolled -- no severe wheezing on  exam Increase strength of  QVAR from 40 to 80 (I have also sent in [place] in case this is cheaper for him to fill) Start singulair 10 mg nightly -- rare adverse effect of suicidality discussed If still requiring increased use of albuterol, recommend close follow-up  Abnormal finding on diagnostic imaging of right testicle Referral to urology  Anal fissure Management per GI and surgery  Condyloma Cryo performed today Seeing derm next week  We will extend his Short Term Disability to 11/27   I,Alexis Herring,acting as a scribe for [employer], PA.,have documented all relevant documentation on the behalf of Inda Coke, PA,as directed by  Inda Coke, PA while in the presence of Inda Coke, [place].  I, Inda Coke, [place], have reviewed all documentation for this visit. The documentation on 04/15/22 for the exam, diagnosis, procedures, and orders are all accurate and complete.  Inda Coke [name]

## 2022-04-16 NOTE — Telephone Encounter (Signed)
Received fax from [facility] needing recent office notes, imaging and lab tests. I have faxed everything over requested to [phone].

## 2022-04-17 ENCOUNTER — Telehealth: Payer: Self-pay | Admitting: Physician Assistant

## 2022-04-17 NOTE — Telephone Encounter (Signed)
..  Type of form received: STD  Additional comments: faxed came through onbase  Received by:  [employer]  Form should be Faxed to: [phone]  Form should be mailed to:  na  Is patient requesting call for pickup:na   Form placed:  samanthas folder   Attach charge sheet.  Yes   Individual made aware of 3-5 business day turn around (Y/N)?   No

## 2022-04-17 NOTE — Telephone Encounter (Signed)
Called [employer] and spoke to [place], told her received fax saying you needed Medical records. I faxed over 25 pages yesterday with update office notes. Colletta [place] checked and yes she received them and does not need anything. Told her okay, thanks.

## 2022-04-24 ENCOUNTER — Telehealth: Payer: Self-pay

## 2022-04-24 NOTE — Telephone Encounter (Signed)
Short Term Disability paperwork completed and faxed to [facility] fax # [phone]. Scanned into chart and [facility] informed.

## 2022-04-27 ENCOUNTER — Ambulatory Visit: Payer: BC Managed Care – PPO | Admitting: Physician Assistant

## 2022-04-27 ENCOUNTER — Encounter: Payer: Self-pay | Admitting: Physician Assistant

## 2022-04-27 VITALS — BP 100/64 | HR 70 | Temp 97.7°F | Ht 69.0 in | Wt 225.4 lb

## 2022-04-27 DIAGNOSIS — R93811 Abnormal radiologic findings on diagnostic imaging of right testicle: Secondary | ICD-10-CM

## 2022-04-27 DIAGNOSIS — J4540 Moderate persistent asthma, uncomplicated: Secondary | ICD-10-CM | POA: Diagnosis not present

## 2022-04-27 DIAGNOSIS — A630 Anogenital (venereal) warts: Secondary | ICD-10-CM

## 2022-04-27 DIAGNOSIS — K602 Anal fissure, unspecified: Secondary | ICD-10-CM | POA: Diagnosis not present

## 2022-04-27 NOTE — Progress Notes (Signed)
[name] [name] is a 46 y.o. male here for a follow up of a pre-existing problem.  History of Present Illness:   Chief Complaint  Patient presents with   Asthma    HPI  Anal Fissure MRI done. Follow up scheduled with surgery Dr. Marcello Moores.   MRI also found abnormal R testicle MRI Pelvis 04/14/2022: Small mildly heterogeneous appearing RIGHT testis is incompletely assessed and resides within the lower inguinal canal as seen on prior imaging. This may be the result of prior trauma or cryptorchid testis. Given the possibility of increased risk of testicular neoplasm would suggest correlation with prior imaging of this area if performed or follow-up scrotal sonogram if not performed previously.  Scheduled to see urology in December.     Asthma He is doing much better since last visit. Using daily antihistamine. Started on PO Singulair 54m at last visit. His QVAR dose was doubled to 851m 2 puffs BID as well. Denies any complications.  He has not needed his Albuterol inhaler since starting Singulair.   Condyloma He had an uncomfortable ongoing condyloma in his upper L groin area. Has had cryotherapy with me in the past. Last was on 04/15/22.   He saw ErMarella Chimesor follow up for this and she ordered biopsy on 04/22/22.    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   COVID-19 06/2021   [place]an [place]url small and large intestines--  hx bowel resection 02/ 2009--  currently treated w/ humira   Essential hypertension, benign    History of CMV 10/14/2010   resolved w/ antiviral therapy   History of kidney stones    History of resection of small bowel    Hyperlipidemia    Major depressive disorder    OSA (obstructive sleep apnea)    01-05-2018 per pt resolved since tonsils removed    Ventral hernia    Vitamin D deficiency      Social  History   Tobacco Use   Smoking status: Never   Smokeless tobacco: Never  Vaping Use   Vaping Use: Never used  Substance Use Topics   Alcohol use: Yes    Alcohol/week: 3.0 standard drinks of alcohol    Types: 3 Shots of liquor per week   Drug use: No    Past Surgical History:  Procedure Laterality Date   ANAL FISTULOTOMY N/A 01/07/2018   Procedure: ANAL FISTULOTOMY;  Surgeon: ThLeighton RuffMD;  [facility];  Laterality: N/A;   COLONOSCOPY  last one 04-2017   dr geCarlean Purl EXPLORATORY LAPAROTOMY W/ ILEOSECECTOMY W/ PRIMARY ANASTAMOSIS  08-05-2007    [facility]   GANGLION CYST EXCISION Right 2016   wrist   HYPOSPADIAS CORRECTION  CHILD   SPHINCTEROTOMY N/A 01/07/2018   Procedure: CHEMICAL SPHINCTEROTOMY (BOTOX);  Surgeon: ThLeighton RuffMD;  [facility]: General;  Laterality: N/A;   TONSILLECTOMY Bilateral 09/30/2015   Procedure: BILATERAL TONSILLECTOMY;  Surgeon: JeIzora GalaMD;  [place] [facility] [place] [place];  Laterality: Bilateral;   UPPER GASTROINTESTINAL ENDOSCOPY  10/15/2010   w/biopsy, mild gastritis and duodenitis    Family History  Problem Relation Age of Onset   Heart disease Father        CAD/MI   Hyperlipidemia  Father    Hypertension Father    Gout Father    Sleep apnea Father    Obesity Mother    Allergies Mother    Cancer Mother    Stroke Maternal Grandfather    Allergies Sister    Asthma Sister    Colon cancer Neg Hx    Diabetes Neg Hx    COPD Neg Hx    Colon polyps Neg Hx    Stomach cancer Neg Hx    Pancreatic cancer Neg Hx     No Known Allergies  Current Medications:   Current Outpatient Medications:    acetaminophen (TYLENOL) 325 MG tablet, Take 650 mg by mouth every 6 (six) hours as needed for moderate pain., Disp: , Rfl:    Adalimumab (HUMIRA PEN) 40 MG/0.4ML PNKT, INJECT 40 MG UNDER THE SKIN ONCE A WEEK, Disp: 12 each, Rfl: 3    albuterol (VENTOLIN HFA) 108 (90 Base) MCG/ACT inhaler, TAKE 2 PUFFS BY MOUTH EVERY 6 HOURS AS NEEDED FOR WHEEZE OR SHORTNESS OF BREATH, Disp: 8.5 each, Rfl: 1   ALPRAZolam (XANAX XR) 3 MG 24 hr tablet, TAKE 1 TABLET BY MOUTH EVERY DAY IN THE MORNING, Disp: 30 tablet, Rfl: 2   alprazolam (XANAX) 2 MG tablet, TAKE 1/2 TAB BY MOUTH TWICE DAILY AS NEEDED., Disp: 30 tablet, Rfl: 2   AMBULATORY NON FORMULARY MEDICATION, Dilitazem 2% mixed with Lidocaine 5% Sig: apply a pea size amount to rectum 2-3 times a day, Disp: 30 g, Rfl: 3   beclomethasone (QVAR REDIHALER) 80 MCG/ACT inhaler, Inhale 2 puffs into the lungs 2 (two) times daily., Disp: 3 each, Rfl: 1   busPIRone (BUSPAR) 30 MG tablet, Take 1 tablet by mouth in the morning and at bedtime., Disp: , Rfl:    cyanocobalamin (VITAMIN B12) 1000 MCG tablet, Take 1 tablet (1,000 mcg total) by mouth daily., Disp: , Rfl:    diphenoxylate-atropine (LOMOTIL) 2.5-0.025 MG tablet, TAKE 1 TABLET BY MOUTH 4 TIMES A DAY AS NEEDED FOR DIARRHEA/LOOSE STOOLS, Disp: 90 tablet, Rfl: 1   emtricitabine-tenofovir (TRUVADA) 200-300 MG tablet, TAKE 1 TABLET BY MOUTH EVERY DAY, Disp: 90 tablet, Rfl: 0   gabapentin (NEURONTIN) 600 MG tablet, Take 600 mg by mouth at bedtime., Disp: , Rfl:    ketoconazole (NIZORAL) 2 % cream, Apply 1 Application topically 2 (two) times daily., Disp: 30 g, Rfl: 0   L-Methylfolate-Algae (DEPLIN 15 PO), Take 1 capsule by mouth daily. A medical food supplement, Disp: , Rfl:    lisinopril-hydrochlorothiazide (ZESTORETIC) 20-25 MG tablet, TAKE 1 TABLET BY MOUTH EVERY DAY, Disp: 90 tablet, Rfl: 2   montelukast (SINGULAIR) 10 MG tablet, Take 1 tablet (10 mg total) by mouth at bedtime., Disp: 90 tablet, Rfl: 1   ondansetron (ZOFRAN) 4 MG tablet, TAKE 1 TAB BY MOUTH EVERY 6 HOURS (Patient taking differently: Take 4 mg by mouth every 6 (six) hours as needed for nausea or vomiting.), Disp: 30 tablet, Rfl: 1   rosuvastatin (CRESTOR) 20 MG tablet, TAKE 1 TABLET  BY MOUTH EVERY DAY, Disp: 90 tablet, Rfl: 0   vortioxetine HBr (TRINTELLIX) 10 MG TABS tablet, Take 10 mg by mouth daily., Disp: , Rfl:    zolpidem (AMBIEN) 10 MG tablet, TAKE 1 TABLET BY MOUTH EVERY DAY AT BEDTIME AS NEEDED FOR SLEEP, Disp: 30 tablet, Rfl: 5   Review of Systems:   Review of Systems  Constitutional:  Negative for chills, fever, malaise/fatigue and weight loss.  HENT:  Negative for hearing loss, sinus pain  and sore throat.   Respiratory:  Negative for cough and hemoptysis.   Cardiovascular:  Negative for chest pain, palpitations, leg swelling and PND.  Gastrointestinal:  Negative for abdominal pain, constipation, diarrhea, heartburn, nausea and vomiting.  Genitourinary:  Negative for dysuria, frequency and urgency.  Musculoskeletal:  Negative for back pain, myalgias and neck pain.  Skin:  Negative for itching and rash.  Neurological:  Negative for dizziness, tingling, seizures and headaches.  Endo/Heme/Allergies:  Negative for polydipsia.  Psychiatric/Behavioral:  Negative for depression. The patient is not nervous/anxious.     Vitals:   Vitals:   04/27/22 1437  BP: 100/64  Pulse: 70  Temp: 97.7 F (36.5 C)  TempSrc: Temporal  SpO2: 95%  Weight: 225 lb 6.1 oz (102.2 kg)  Height: 5' 9"  (1.753 m)     Body mass index is 33.28 kg/m.  Physical Exam:   Physical Exam Vitals and nursing note reviewed.  Constitutional:      Appearance: He is well-developed.  HENT:     Head: Normocephalic.  Eyes:     Conjunctiva/sclera: Conjunctivae normal.     Pupils: Pupils are equal, round, and reactive to light.  Pulmonary:     Effort: Pulmonary effort is normal.  Musculoskeletal:        General: Normal range of motion.     Cervical back: Normal range of motion.  Skin:    General: Skin is warm and dry.  Neurological:     Mental Status: He is alert and oriented to person, place, and time.  Psychiatric:        Behavior: Behavior normal.        Thought Content: Thought  content normal.        Judgment: Judgment normal.     Assessment and Plan:   Anal fissure Mgmt per surgery  Condyloma Mgmt per derm  Moderate persistent asthma, unspecified whether complicated Improving with singulair 10 mg Continues on QVAR and prn albuterol Denies concerns Follow-up as needed  Abnormal finding on diagnostic imaging of right testicle Mgmt per urology  I,Alexis Herring,acting as a scribe for [employer], PA.,have documented all relevant documentation on the behalf of Inda Coke, PA,as directed by  Inda Coke, PA while in the presence of Inda Coke, [place].  I, Inda Coke, [place], have reviewed all documentation for this visit. The documentation on 04/27/22 for the exam, diagnosis, procedures, and orders are all accurate and complete.  Inda Coke [name]

## 2022-04-30 ENCOUNTER — Encounter: Payer: Self-pay | Admitting: Internal Medicine

## 2022-05-01 ENCOUNTER — Other Ambulatory Visit: Payer: Self-pay

## 2022-05-01 DIAGNOSIS — K50813 Crohn's disease of both small and large intestine with fistula: Secondary | ICD-10-CM

## 2022-05-01 NOTE — Telephone Encounter (Signed)
Pt was contacted and notified of Dr. Carlean Purl recommendations: Pt was scheduled for a Colonoscopy on 05/19/2022 at 4:00 PM Pt made aware: Prep instructions were sent to pt via my chart: pt made aware: Ambulatory referral to GI placed in EPIC.  Pt verbalized understanding with all questions answered.

## 2022-05-01 NOTE — Telephone Encounter (Signed)
Inbound call from patient returning call.

## 2022-05-12 ENCOUNTER — Encounter: Payer: Self-pay | Admitting: Internal Medicine

## 2022-05-12 NOTE — Telephone Encounter (Signed)
See new message and advise.

## 2022-05-13 ENCOUNTER — Ambulatory Visit: Payer: BC Managed Care – PPO | Admitting: Physician Assistant

## 2022-05-13 ENCOUNTER — Encounter: Payer: Self-pay | Admitting: Physician Assistant

## 2022-05-13 VITALS — BP 108/70 | HR 75 | Temp 98.2°F | Ht 69.0 in | Wt 223.0 lb

## 2022-05-13 DIAGNOSIS — R051 Acute cough: Secondary | ICD-10-CM

## 2022-05-13 MED ORDER — HYDROCOD POLI-CHLORPHE POLI ER 10-8 MG/5ML PO SUER: 5.0000 mL | Freq: Every evening | ORAL | 0 refills | Status: DC | PRN

## 2022-05-13 NOTE — Progress Notes (Signed)
[name] [name] is a 46 y.o. male here for a new problem.  History of Present Illness:   Chief Complaint  Patient presents with   Cough    Pt c/o cough x several days, expectorating yellow/green sputum. He has been taking Delsym.    HPI  Cough Patient reports that he is experiencing a productive cough. He took a Covid test yesterday that came back negative. He manages his symptom with delsym cough syrup. He denies SOB, fever, chills, and body aches.   His cough disrupts his sleep at night.  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   COVID-19 06/2021   Crohn's disease ([place]) followed by dr Carlean Purl   small and large intestines--  hx bowel resection 02/ 2009--  currently treated w/ humira   Essential hypertension, benign    History of CMV 10/14/2010   resolved w/ antiviral therapy   History of kidney stones    History of resection of small bowel    Hyperlipidemia    Major depressive disorder    OSA (obstructive sleep apnea)    01-05-2018 per pt resolved since tonsils removed    Ventral hernia    Vitamin D deficiency      Social History   Tobacco Use   Smoking status: Never   Smokeless tobacco: Never  Vaping Use   Vaping Use: Never used  Substance Use Topics   Alcohol use: Yes    Alcohol/week: 3.0 standard drinks of alcohol    Types: 3 Shots of liquor per week   Drug use: No    Past Surgical History:  Procedure Laterality Date   ANAL FISTULOTOMY N/A 01/07/2018   Procedure: ANAL FISTULOTOMY;  Surgeon: Leighton Ruff, MD;  Location: [facility];  Service: General;  Laterality: N/A;   COLONOSCOPY  last one 04-2017   dr Carlean Purl   EXPLORATORY LAPAROTOMY W/ ILEOSECECTOMY W/ PRIMARY ANASTAMOSIS  08-05-2007    dr gross  [facility]   crohn's, sbo, perferation's   GANGLION CYST EXCISION Right 2016   wrist   HYPOSPADIAS CORRECTION  CHILD    SPHINCTEROTOMY N/A 01/07/2018   Procedure: CHEMICAL SPHINCTEROTOMY (BOTOX);  Surgeon: Leighton Ruff, MD;  Location: [facility];  Service: General;  Laterality: N/A;   TONSILLECTOMY Bilateral 09/30/2015   Procedure: BILATERAL TONSILLECTOMY;  Surgeon: Izora Gala, MD;  Location[facility];  Service: ENT;  Laterality: Bilateral;   UPPER GASTROINTESTINAL ENDOSCOPY  10/15/2010   w/biopsy, mild gastritis and duodenitis    Family History  Problem Relation Age of Onset   Heart disease Father        CAD/MI   Hyperlipidemia Father    Hypertension Father    Gout Father    Sleep apnea Father    Obesity Mother    Allergies Mother    Cancer Mother    Stroke Maternal Grandfather    Allergies Sister    Asthma Sister    Colon cancer Neg Hx    Diabetes Neg Hx    COPD Neg Hx    Colon polyps Neg Hx    Stomach cancer Neg Hx    Pancreatic cancer Neg Hx     No Known Allergies  Current Medications:   Current Outpatient Medications:    acetaminophen (TYLENOL) 325 MG tablet, Take 650 mg by mouth every 6 (six) hours as needed for moderate pain., Disp: ,  Rfl:    Adalimumab (HUMIRA PEN) 40 MG/0.4ML PNKT, INJECT 40 MG UNDER THE SKIN ONCE A WEEK, Disp: 12 each, Rfl: 3   albuterol (VENTOLIN HFA) 108 (90 Base) MCG/ACT inhaler, TAKE 2 PUFFS BY MOUTH EVERY 6 HOURS AS NEEDED FOR WHEEZE OR SHORTNESS OF BREATH, Disp: 8.5 each, Rfl: 1   ALPRAZolam (XANAX XR) 3 MG 24 hr tablet, TAKE 1 TABLET BY MOUTH EVERY DAY IN THE MORNING, Disp: 30 tablet, Rfl: 2   alprazolam (XANAX) 2 MG tablet, TAKE 1/2 TAB BY MOUTH TWICE DAILY AS NEEDED., Disp: 30 tablet, Rfl: 2   AMBULATORY NON FORMULARY MEDICATION, Dilitazem 2% mixed with Lidocaine 5% Sig: apply a pea size amount to rectum 2-3 times a day, Disp: 30 g, Rfl: 3   beclomethasone (QVAR REDIHALER) 80 MCG/ACT inhaler, Inhale 2 puffs into the lungs 2 (two) times daily., Disp: 3 each, Rfl: 1   busPIRone (BUSPAR) 30 MG tablet, Take 1 tablet by mouth in the morning and at  bedtime., Disp: , Rfl:    chlorpheniramine-HYDROcodone (TUSSIONEX) 10-8 MG/5ML, Take 5 mLs by mouth at bedtime as needed for cough., Disp: 70 mL, Rfl: 0   cyanocobalamin (VITAMIN B12) 1000 MCG tablet, Take 1 tablet (1,000 mcg total) by mouth daily., Disp: , Rfl:    diphenoxylate-atropine (LOMOTIL) 2.5-0.025 MG tablet, TAKE 1 TABLET BY MOUTH 4 TIMES A DAY AS NEEDED FOR DIARRHEA/LOOSE STOOLS, Disp: 90 tablet, Rfl: 1   emtricitabine-tenofovir (TRUVADA) 200-300 MG tablet, TAKE 1 TABLET BY MOUTH EVERY DAY, Disp: 90 tablet, Rfl: 0   gabapentin (NEURONTIN) 600 MG tablet, Take 600 mg by mouth at bedtime., Disp: , Rfl:    ketoconazole (NIZORAL) 2 % cream, Apply 1 Application topically 2 (two) times daily., Disp: 30 g, Rfl: 0   L-Methylfolate-Algae (DEPLIN 15 PO), Take 1 capsule by mouth daily. A medical food supplement, Disp: , Rfl:    lisinopril-hydrochlorothiazide (ZESTORETIC) 20-25 MG tablet, TAKE 1 TABLET BY MOUTH EVERY DAY, Disp: 90 tablet, Rfl: 2   montelukast (SINGULAIR) 10 MG tablet, Take 1 tablet (10 mg total) by mouth at bedtime., Disp: 90 tablet, Rfl: 1   ondansetron (ZOFRAN) 4 MG tablet, TAKE 1 TAB BY MOUTH EVERY 6 HOURS (Patient taking differently: Take 4 mg by mouth every 6 (six) hours as needed for nausea or vomiting.), Disp: 30 tablet, Rfl: 1   rosuvastatin (CRESTOR) 20 MG tablet, TAKE 1 TABLET BY MOUTH EVERY DAY, Disp: 90 tablet, Rfl: 0   vortioxetine HBr (TRINTELLIX) 10 MG TABS tablet, Take 10 mg by mouth daily., Disp: , Rfl:    zolpidem (AMBIEN) 10 MG tablet, TAKE 1 TABLET BY MOUTH EVERY DAY AT BEDTIME AS NEEDED FOR SLEEP, Disp: 30 tablet, Rfl: 5   Review of Systems:   Review of Systems  Constitutional:  Negative for chills and fever.  Respiratory:  Positive for cough (productive). Negative for shortness of breath.     Vitals:   Vitals:   05/13/22 0929  BP: 108/70  Pulse: 75  Temp: 98.2 F (36.8 C)  TempSrc: Temporal  SpO2: 98%  Weight: 223 lb (101.2 kg)  Height: 5' 9"   (1.753 m)     Body mass index is 32.93 kg/m.  Physical Exam:   Physical Exam Constitutional:      General: He is not in acute distress.    Appearance: Normal appearance. He is not ill-appearing.  HENT:     Head: Normocephalic and atraumatic.     Right Ear: External ear normal.  Left Ear: External ear normal.  Eyes:     Extraocular Movements: Extraocular movements intact.     Pupils: Pupils are equal, round, and reactive to light.  Cardiovascular:     Rate and Rhythm: Normal rate and regular rhythm.     Heart sounds: Normal heart sounds. No murmur heard.    No gallop.  Pulmonary:     Effort: Pulmonary effort is normal. No respiratory distress.     Breath sounds: Normal breath sounds. No wheezing or rales.  Skin:    General: Skin is warm and dry.  Neurological:     Mental Status: He is alert and oriented to person, place, and time.  Psychiatric:        Judgment: Judgment normal.     Assessment and Plan:   Acute cough No red flags on discussion, patient is not in any obvious distress during our visit. Discussed progression of most viral illness, and recommended supportive care at this point in time.  Did prescribe Tussionex for him to use at time, he was cautioned to not take this prior to operating motor vehicle. Discussed over the counter supportive care options, with recommendations to push fluids and rest. Reviewed return precautions including new/worsening fever, SOB, new/worsening cough or other concerns.  Recommended need to self-quarantine and practice social distancing until symptoms resolve. Discussed current recommendations for COVID testing. I recommend that patient follow-up if symptoms worsen or persist despite treatment x 7-10 days, sooner if needed.  I,Verona Buck,acting as a Education administrator for [employer], PA.,have documented all relevant documentation on the behalf of Inda Coke, PA,as directed by  Inda Coke, PA while in the presence of Inda Coke, [place].  I, Inda Coke, [place], have reviewed all documentation for this visit. The documentation on 05/13/22 for the exam, diagnosis, procedures, and orders are all accurate and complete.  Inda Coke, [name]

## 2022-05-19 ENCOUNTER — Encounter: Payer: Self-pay | Admitting: Internal Medicine

## 2022-05-19 ENCOUNTER — Ambulatory Visit (AMBULATORY_SURGERY_CENTER): Payer: BC Managed Care – PPO | Admitting: Internal Medicine

## 2022-05-19 VITALS — BP 113/76 | HR 69 | Temp 97.1°F | Resp 14 | Ht 69.0 in | Wt 221.0 lb

## 2022-05-19 DIAGNOSIS — K5000 Crohn's disease of small intestine without complications: Secondary | ICD-10-CM | POA: Diagnosis not present

## 2022-05-19 DIAGNOSIS — K50813 Crohn's disease of both small and large intestine with fistula: Secondary | ICD-10-CM

## 2022-05-19 DIAGNOSIS — K5080 Crohn's disease of both small and large intestine without complications: Secondary | ICD-10-CM

## 2022-05-19 DIAGNOSIS — K602 Anal fissure, unspecified: Secondary | ICD-10-CM

## 2022-05-19 DIAGNOSIS — K9189 Other postprocedural complications and disorders of digestive system: Secondary | ICD-10-CM | POA: Diagnosis not present

## 2022-05-19 DIAGNOSIS — K603 Anal fistula: Secondary | ICD-10-CM | POA: Diagnosis present

## 2022-05-19 DIAGNOSIS — Z7960 Long term (current) use of unspecified immunomodulators and immunosuppressants: Secondary | ICD-10-CM

## 2022-05-19 MED ORDER — SODIUM CHLORIDE 0.9 % IV SOLN: 500.0000 mL | Freq: Once | INTRAVENOUS | Status: DC

## 2022-05-19 NOTE — Progress Notes (Signed)
[facility] History and Physical   Primary Care Physician:  Inda Coke, [place]   Reason for Procedure:   Crohn's disease w/ rectal pain - fissure + fistula  Plan:    colonoscopy     HPI: [name] [name] is a 46 y.o. 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   COVID-19 06/2021   Crohn's disease ([place]) followed by dr Carlean Purl   small and large intestines--  hx bowel resection 02/ 2009--  currently treated w/ humira   Essential hypertension, benign    History of CMV 10/14/2010   resolved w/ antiviral therapy   History of kidney stones    History of resection of small bowel    Hyperlipidemia    Major depressive disorder    OSA (obstructive sleep apnea)    01-05-2018 per pt resolved since tonsils removed    Ventral hernia    Vitamin D deficiency     Past Surgical History:  Procedure Laterality Date   ANAL FISTULOTOMY N/A 01/07/2018   Procedure: ANAL FISTULOTOMY;  Surgeon: Leighton Ruff, MD;  Location: [facility];  Service: General;  Laterality: N/A;   COLONOSCOPY  last one 04-2017   dr Carlean Purl   EXPLORATORY LAPAROTOMY W/ ILEOSECECTOMY W/ PRIMARY ANASTAMOSIS  08-05-2007    dr gross  [facility]   crohn's, sbo, perferation's   GANGLION CYST EXCISION Right 2016   wrist   HYPOSPADIAS CORRECTION  CHILD   SPHINCTEROTOMY N/A 01/07/2018   Procedure: CHEMICAL SPHINCTEROTOMY (BOTOX);  Surgeon: Leighton Ruff, MD;  Location: [facility];  Service: General;  Laterality: N/A;   TONSILLECTOMY Bilateral 09/30/2015   Procedure: BILATERAL TONSILLECTOMY;  Surgeon: Izora Gala, MD;  Location: [place];  Service: ENT;  Laterality: Bilateral;   UPPER GASTROINTESTINAL ENDOSCOPY  10/15/2010   w/biopsy, mild gastritis and duodenitis    Prior to Admission medications    Medication Sig Start Date End Date Taking? Authorizing Provider  acetaminophen (TYLENOL) 325 MG tablet Take 650 mg by mouth every 6 (six) hours as needed for moderate pain.   Yes [provider]  albuterol (VENTOLIN HFA) 108 (90 Base) MCG/ACT inhaler TAKE 2 PUFFS BY MOUTH EVERY 6 HOURS AS NEEDED FOR WHEEZE OR SHORTNESS OF BREATH 04/10/22  Yes Morene Rankins, Ava, PA  ALPRAZolam (XANAX XR) 3 MG 24 hr tablet TAKE 1 TABLET BY MOUTH EVERY DAY IN THE MORNING 07/23/21  Yes Biagio Borg, MD  alprazolam Duanne Moron) 2 MG tablet TAKE 1/2 TAB BY MOUTH TWICE DAILY AS NEEDED. 07/23/21  Yes Biagio Borg, MD  AMBULATORY NON FORMULARY MEDICATION Dilitazem 2% mixed with Lidocaine 5% Sig: apply a pea size amount to rectum 2-3 times a day 04/02/22  Yes Gatha Mayer, MD  beclomethasone (QVAR REDIHALER) 80 MCG/ACT inhaler Inhale 2 puffs into the lungs 2 (two) times daily. 04/15/22  Yes Inda Coke, PA  busPIRone (BUSPAR) 30 MG tablet Take 1 tablet by mouth in the morning and at bedtime. 10/04/21  Yes [provider]  chlorpheniramine-HYDROcodone (TUSSIONEX) 10-8 MG/5ML Take 5 mLs by mouth at bedtime as needed for cough. 05/13/22  Yes Inda Coke, PA  cyanocobalamin (VITAMIN B12) 1000 MCG tablet Take 1 tablet (1,000 mcg total) by mouth daily. 04/02/22  Yes Gatha Mayer, MD  emtricitabine-tenofovir (TRUVADA) 200-300 MG tablet  TAKE 1 TABLET BY MOUTH EVERY DAY 02/09/22  Yes Inda Coke, PA  gabapentin (NEURONTIN) 600 MG tablet Take 600 mg by mouth at bedtime.   Yes [provider]  L-Methylfolate-Algae (DEPLIN 15 PO) Take 1 capsule by mouth daily. A medical food supplement   Yes [provider]  lisinopril-hydrochlorothiazide (ZESTORETIC) 20-25 MG tablet TAKE 1 TABLET BY MOUTH EVERY DAY 12/01/21  Yes Inda Coke, PA  montelukast (SINGULAIR) 10 MG tablet Take 1 tablet (10 mg total) by mouth at bedtime. 04/15/22  Yes Worley, Aldona Bar, PA  rosuvastatin (CRESTOR) 20 MG tablet  TAKE 1 TABLET BY MOUTH EVERY DAY 03/17/22  Yes Inda Coke, PA  vortioxetine HBr (TRINTELLIX) 10 MG TABS tablet Take 10 mg by mouth daily.   Yes [provider]  zolpidem (AMBIEN) 10 MG tablet TAKE 1 TABLET BY MOUTH EVERY DAY AT BEDTIME AS NEEDED FOR SLEEP 07/23/21  Yes Biagio Borg, MD  Adalimumab (HUMIRA PEN) 40 MG/0.4ML PNKT INJECT 40 MG UNDER THE SKIN ONCE A WEEK 07/17/21   Gatha Mayer, MD  diphenoxylate-atropine (LOMOTIL) 2.5-0.025 MG tablet TAKE 1 TABLET BY MOUTH 4 TIMES A DAY AS NEEDED FOR DIARRHEA/LOOSE STOOLS Patient not taking: Reported on 05/19/2022 05/15/20   Gatha Mayer, MD  ketoconazole (NIZORAL) 2 % cream Apply 1 Application topically 2 (two) times daily. 01/23/22   Inda Coke, PA  ondansetron (ZOFRAN) 4 MG tablet TAKE 1 TAB BY MOUTH EVERY 6 HOURS Patient taking differently: Take 4 mg by mouth every 6 (six) hours as needed for nausea or vomiting. 11/17/18   Gatha Mayer, MD    Current Outpatient Medications  Medication Sig Dispense Refill   acetaminophen (TYLENOL) 325 MG tablet Take 650 mg by mouth every 6 (six) hours as needed for moderate pain.     albuterol (VENTOLIN HFA) 108 (90 Base) MCG/ACT inhaler TAKE 2 PUFFS BY MOUTH EVERY 6 HOURS AS NEEDED FOR WHEEZE OR SHORTNESS OF BREATH 8.5 each 1   ALPRAZolam (XANAX XR) 3 MG 24 hr tablet TAKE 1 TABLET BY MOUTH EVERY DAY IN THE MORNING 30 tablet 2   alprazolam (XANAX) 2 MG tablet TAKE 1/2 TAB BY MOUTH TWICE DAILY AS NEEDED. 30 tablet 2   AMBULATORY NON FORMULARY MEDICATION Dilitazem 2% mixed with Lidocaine 5% Sig: apply a pea size amount to rectum 2-3 times a day 30 g 3   beclomethasone (QVAR REDIHALER) 80 MCG/ACT inhaler Inhale 2 puffs into the lungs 2 (two) times daily. 3 each 1   busPIRone (BUSPAR) 30 MG tablet Take 1 tablet by mouth in the morning and at bedtime.     chlorpheniramine-HYDROcodone (TUSSIONEX) 10-8 MG/5ML Take 5 mLs by mouth at bedtime as needed for cough. 70 mL 0   cyanocobalamin (VITAMIN  B12) 1000 MCG tablet Take 1 tablet (1,000 mcg total) by mouth daily.     emtricitabine-tenofovir (TRUVADA) 200-300 MG tablet TAKE 1 TABLET BY MOUTH EVERY DAY 90 tablet 0   gabapentin (NEURONTIN) 600 MG tablet Take 600 mg by mouth at bedtime.     L-Methylfolate-Algae (DEPLIN 15 PO) Take 1 capsule by mouth daily. A medical food supplement     lisinopril-hydrochlorothiazide (ZESTORETIC) 20-25 MG tablet TAKE 1 TABLET BY MOUTH EVERY DAY 90 tablet 2   montelukast (SINGULAIR) 10 MG tablet Take 1 tablet (10 mg total) by mouth at bedtime. 90 tablet 1   rosuvastatin (CRESTOR) 20 MG tablet TAKE 1 TABLET BY MOUTH EVERY DAY 90 tablet 0   vortioxetine HBr (TRINTELLIX) 10 MG  TABS tablet Take 10 mg by mouth daily.     zolpidem (AMBIEN) 10 MG tablet TAKE 1 TABLET BY MOUTH EVERY DAY AT BEDTIME AS NEEDED FOR SLEEP 30 tablet 5   Adalimumab (HUMIRA PEN) 40 MG/0.4ML PNKT INJECT 40 MG UNDER THE SKIN ONCE A WEEK 12 each 3   diphenoxylate-atropine (LOMOTIL) 2.5-0.025 MG tablet TAKE 1 TABLET BY MOUTH 4 TIMES A DAY AS NEEDED FOR DIARRHEA/LOOSE STOOLS (Patient not taking: Reported on 05/19/2022) 90 tablet 1   ketoconazole (NIZORAL) 2 % cream Apply 1 Application topically 2 (two) times daily. 30 g 0   ondansetron (ZOFRAN) 4 MG tablet TAKE 1 TAB BY MOUTH EVERY 6 HOURS (Patient taking differently: Take 4 mg by mouth every 6 (six) hours as needed for nausea or vomiting.) 30 tablet 1   Current Facility-Administered Medications  Medication Dose Route Frequency Provider Last Rate Last Admin   0.9 %  sodium chloride infusion  500 mL Intravenous Once Gatha Mayer, MD        Allergies as of 05/19/2022   (No Known Allergies)    Family History  Problem Relation Age of Onset   Heart disease Father        CAD/MI   Hyperlipidemia Father    Hypertension Father    Gout Father    Sleep apnea Father    Obesity Mother    Allergies Mother    Cancer Mother    Stroke Maternal Grandfather    Allergies Sister    Asthma Sister     Colon cancer Neg Hx    Diabetes Neg Hx    COPD Neg Hx    Colon polyps Neg Hx    Stomach cancer Neg Hx    Pancreatic cancer Neg Hx     Social History   Socioeconomic History   Marital status: Divorced    Spouse name: Not on file   Number of children: 2   Years of education: 14   Highest education level: Not on file  Occupational History   Occupation: Mortgage    Employer: [employer]  Tobacco Use   Smoking status: Never   Smokeless tobacco: Never  Vaping Use   Vaping Use: Never used  Substance and Sexual Activity   Alcohol use: Yes    Alcohol/week: 3.0 standard drinks of alcohol    Types: 3 Shots of liquor per week   Drug use: No   Sexual activity: Yes    Partners: Male    Comment: Truvada protection  Other Topics Concern   Not on file  Social History Narrative   HSG, [place] - 2 years. Married '99 - 3yr/divorced. 2 sons - '98, '00 split custody. 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 No history of physical or sexual abuse.    [employer]  Social Determinants of Health   Financial Resource Strain: Not on file  Food Insecurity: Not on file  Transportation Needs: Not on file  Physical Activity: Not on file  Stress: Not on file  Social Connections: Not on file  Intimate Partner Violence: Not on file    Review of Systems:  All other review of systems negative except as mentioned in the HPI.  Physical Exam: Vital signs BP 106/61   Pulse 71   Temp (!) 97.1 F (36.2 C) (Temporal)   Ht 5' 9"  (1.753 m)   Wt 221 lb (100.2 kg)   SpO2 97%   BMI 32.64 kg/m   General:  Alert,  Well-developed, well-nourished, pleasant and cooperative in NAD Lungs:  Clear throughout to auscultation.   Heart:  Regular rate and rhythm; no murmurs, clicks, rubs,  or gallops. Abdomen:  Soft, nontender and nondistended. Normal bowel sounds.   Neuro/Psych:  Alert and cooperative. Normal mood and affect. A and O x 3   @Chaney Ingram  Simonne Maffucci, MD,  [facility] [phone] (pager) 05/19/2022 3:42 PM@

## 2022-05-19 NOTE — Patient Instructions (Addendum)
There is some inflammation in the small intestine. Anal fissure was seen. Colon looks normal.  Let me see the biopsy results and communicate with you and Dr. Marcello Moores and we will sort out next steps. Continue the diltiazem + lidocaine treatment for the fissure.  Resume normal diet and medications.  PLEASE COME TO OUR LAB THE DAY BEFORE OR OF YOUR NEXT HUMIRA INJECTION - CHECK DRUG LEVELS AND ANTIBODIES  I appreciate the opportunity to care for you. Gatha Mayer, MD, [facility]  Await pathology results from the biopsies taken today.  Resume previous diet and medications.  Continue diltiazem and lidocaine for fissure.  Check Adalimumab levels and antibodies (ordered) 1 day or so before next Humira dose.   YOU HAD AN ENDOSCOPIC PROCEDURE TODAY AT [facility]:   Refer to the procedure report that was given to you for any specific questions about what was found during the examination.  If the procedure report does not answer your questions, please call your gastroenterologist to clarify.  If you requested that your care partner not be given the details of your procedure findings, then the procedure report has been included in a sealed envelope for you to review at your convenience later.  YOU SHOULD EXPECT: Some feelings of bloating in the abdomen. Passage of more gas than usual.  Walking can help get rid of the air that was put into your GI tract during the procedure and reduce the bloating. If you had a lower endoscopy (such as a colonoscopy or flexible sigmoidoscopy) you may notice spotting of blood in your stool or on the toilet paper. If you underwent a bowel prep for your procedure, you may not have a normal bowel movement for a few days.  Please Note:  You might notice some irritation and congestion in your nose or some drainage.  This is from the oxygen used during your procedure.  There is no need for concern and it should clear up in a day or so.  SYMPTOMS TO REPORT  IMMEDIATELY:  Following lower endoscopy (colonoscopy or flexible sigmoidoscopy):  Excessive amounts of blood in the stool  Significant tenderness or worsening of abdominal pains  Swelling of the abdomen that is new, acute  Fever of 100F or higher   For urgent or emergent issues, a gastroenterologist can be reached at any hour by calling [phone]. Do not use MyChart messaging for urgent concerns.    DIET:  We do recommend a small meal at first, but then you may proceed to your regular diet.  Drink plenty of fluids but you should avoid alcoholic beverages for 24 hours.  ACTIVITY:  You should plan to take it easy for the rest of today and you should NOT DRIVE or use heavy machinery until tomorrow (because of the sedation medicines used during the test).    FOLLOW UP: Our staff will call the number listed on your records the next business day following your procedure.  We will call around 7:15- 8:00 am to check on you and address any questions or concerns that you may have regarding the information given to you following your procedure. If we do not reach you, we will leave a message.     If any biopsies were taken you will be contacted by phone or by letter within the next 1-3 weeks.  Please call us at [phone] if you have not heard about the biopsies in 3 weeks.    SIGNATURES/CONFIDENTIALITY: You and/or your care partner  have signed paperwork which will be entered into your electronic medical record.  These signatures attest to the fact that that the information above on your After Visit Summary has been reviewed and is understood.  Full responsibility of the confidentiality of this discharge information lies with you and/or your care-partner.

## 2022-05-19 NOTE — Progress Notes (Signed)
Report to PACU, RN, vss, BBS= Clear.  

## 2022-05-19 NOTE — Op Note (Signed)
[place] Patient Name: [name] [name] Procedure Date: 05/19/2022 3:41 PM MRN: [id number] Endoscopist: Gatha Mayer , MD, [id number] Age: 46 Referring MD:  Date of Birth: [birth year] Gender: Male Account #: [id number] Procedure:                Colonoscopy Indications:              Crohn's disease of the small bowel and colon,                            Follow-up of Crohn's disease of the small bowel and                            colon, Disease activity assessment of Crohn's                            disease of the small bowel and colon, Assess                            therapeutic response to therapy of Crohn's disease                            of the small bowel and colon Medicines:                Monitored Anesthesia Care Procedure:                Pre-Anesthesia Assessment:                           - Prior to the procedure, a History and Physical                            was performed, and patient medications and                            allergies were reviewed. The patient's tolerance of                            previous anesthesia was also reviewed. The risks                            and benefits of the procedure and the sedation                            options and risks were discussed with the patient.                            All questions were answered, and informed consent                            was obtained. Prior Anticoagulants: The patient has                            taken no anticoagulant or antiplatelet agents. ASA  Grade Assessment: II - A patient with mild systemic                            disease. After reviewing the risks and benefits,                            the patient was deemed in satisfactory condition to                            undergo the procedure.                           After obtaining informed consent, the colonoscope                            was passed under direct vision.  Throughout the                            procedure, the patient's blood pressure, pulse, and                            oxygen saturations were monitored continuously. The                            CF [id number] #[id number] was introduced through the anus                            and advanced to the the terminal ileum. The                            colonoscopy was performed without difficulty. The                            patient tolerated the procedure well. The quality                            of the bowel preparation was good. The rectum and                            Ileocolonic anastomsis areas were photographed. The                            bowel preparation used was Miralax via split dose                            instruction. Scope In: 3:56:17 PM Scope Out: 4:12:35 PM Scope Withdrawal Time: 0 hours 13 minutes 48 seconds  Total Procedure Duration: 0 hours 16 minutes 18 seconds  Findings:                 An anal fissure was found on perianal exam.                           Patchy inflammation characterized by shallow  ulcerations was found in the terminal ileum. The                            inflammation was mild in severity. Biopsies were                            taken with a cold forceps for histology.                            Verification of patient identification for the                            specimen was done. Estimated blood loss was minimal.                           There was evidence of a prior end-to-side                            ileo-colonic anastomosis in the transverse colon.                            This was patent and was characterized by                            inflammation and ulceration. The anastomosis was                            traversed. Biopsies were taken with a cold forceps                            for histology. Verification of patient                            identification for the specimen was done.  Estimated                            blood loss was minimal.                           An anal fissure was found in the anal canal.                           The exam was otherwise without abnormality on                            direct and retroflexion views.                           Biopsies for histology were taken with a cold                            forceps from the right colon and left colon for  evaluation of microscopic colitis. Complications:            No immediate complications. Estimated Blood Loss:     Estimated blood loss was minimal. Impression:               - Anal fissure (posterior) found on perianal exam.                           - Crohn's disease with ileitis. Inflammation was                            found. This was mild in severity. Few small ulcers.                            Biopsied.                           - Patent end-to-side ileo-colonic anastomosis,                            characterized by inflammation and ulceration.                            Biopsied.                           - Anal fissure. Posterior                           - The examination was otherwise normal on direct                            and retroflexion views.                           - Biopsies were taken with a cold forceps from the                            right colon and left colon for evaluation of                            microscopic colitis. Recommendation:           - Patient has a contact number available for                            emergencies. The signs and symptoms of potential                            delayed complications were discussed with the                            patient. Return to normal activities tomorrow.                            Written discharge instructions were provided to the  patient.                           - Resume previous diet.                           - Continue present  medications.                           - Await pathology results.                           - Continue diltiazem + lidocaine for fissure                           check adalimumab levels and antibodies (ordered) 1                            day or so before next Humira dose (taking weekly) -                            I would not say this level of disease activity                            definitely means treatment failure though not in                            endoscopic remission Gatha Mayer, MD 05/19/2022 4:27:52 PM This report has been signed electronically.

## 2022-05-19 NOTE — Progress Notes (Signed)
VS completed by DT.  Pt's states no medical or surgical changes since previsit or office visit.  

## 2022-05-19 NOTE — Progress Notes (Signed)
Called to room to assist during endoscopic procedure.  Patient ID and intended procedure confirmed with present staff. Received instructions for my participation in the procedure from the performing physician.  

## 2022-05-20 ENCOUNTER — Telehealth: Payer: Self-pay

## 2022-05-20 ENCOUNTER — Other Ambulatory Visit: Payer: BC Managed Care – PPO

## 2022-05-20 ENCOUNTER — Other Ambulatory Visit: Payer: Self-pay | Admitting: Physician Assistant

## 2022-05-20 ENCOUNTER — Encounter: Payer: Self-pay | Admitting: Physician Assistant

## 2022-05-20 DIAGNOSIS — K5080 Crohn's disease of both small and large intestine without complications: Secondary | ICD-10-CM

## 2022-05-20 DIAGNOSIS — Z7960 Long term (current) use of unspecified immunomodulators and immunosuppressants: Secondary | ICD-10-CM

## 2022-05-20 MED ORDER — HYDROCOD POLI-CHLORPHE POLI ER 10-8 MG/5ML PO SUER: 5.0000 mL | Freq: Every evening | ORAL | 0 refills | Status: DC | PRN

## 2022-05-20 MED ORDER — AMOXICILLIN-POT CLAVULANATE 875-125 MG PO TABS: 1.0000 | ORAL_TABLET | Freq: Two times a day (BID) | ORAL | 0 refills | Status: DC

## 2022-05-20 NOTE — Telephone Encounter (Signed)
  Follow up Call-     05/19/2022    3:26 PM  Call back number  Post procedure Call Back phone  # [phone]  Permission to leave phone message Yes     Patient questions:  Do you have a fever, pain , or abdominal swelling? No. Pain Score  0 *  Have you tolerated food without any problems? Yes.    Have you been able to return to your normal activities? Yes.    Do you have any questions about your discharge instructions: Diet   No. Medications  No. Follow up visit  No.  Do you have questions or concerns about your Care? No.  Actions: * If pain score is 4 or above: No action needed, pain <4.

## 2022-05-22 ENCOUNTER — Telehealth: Payer: Self-pay

## 2022-05-22 NOTE — Telephone Encounter (Signed)
Form filled out and faxed to [phone] [facility] group for [place]. Scanned into epic.

## 2022-05-23 ENCOUNTER — Other Ambulatory Visit: Payer: Self-pay | Admitting: Physician Assistant

## 2022-05-23 DIAGNOSIS — Z79899 Other long term (current) drug therapy: Secondary | ICD-10-CM

## 2022-05-25 ENCOUNTER — Encounter: Payer: Self-pay | Admitting: Internal Medicine

## 2022-05-26 LAB — SERIAL MONITORING

## 2022-05-27 ENCOUNTER — Ambulatory Visit: Payer: Self-pay | Admitting: General Surgery

## 2022-05-27 LAB — ADALIMUMAB+AB (SERIAL MONITOR)
Adalimumab Drug Level: 12 ug/mL
Anti-Adalimumab Antibody: <25 ng/mL

## 2022-05-29 ENCOUNTER — Encounter: Payer: Self-pay | Admitting: Internal Medicine

## 2022-05-29 ENCOUNTER — Encounter: Payer: Self-pay | Admitting: Physician Assistant

## 2022-05-29 DIAGNOSIS — R197 Diarrhea, unspecified: Secondary | ICD-10-CM

## 2022-05-29 MED ORDER — DIPHENOXYLATE-ATROPINE 2.5-0.025 MG PO TABS: ORAL_TABLET | ORAL | 1 refills | Status: DC

## 2022-05-29 NOTE — Telephone Encounter (Signed)
Plan:  Stop Augnmentin  Meds ordered this encounter  Medications   diphenoxylate-atropine (LOMOTIL) 2.5-0.025 MG tablet    Sig: TAKE 1 TABLET BY MOUTH 4 TIMES A DAY AS NEEDED FOR DIARRHEA/LOOSE STOOLS    Dispense:  90 tablet    Refill:  1    If still having watery diarrhea 12/11 he will do C diff test (ordered)

## 2022-06-05 ENCOUNTER — Telehealth: Payer: Self-pay | Admitting: Internal Medicine

## 2022-06-05 NOTE — Telephone Encounter (Signed)
Inbound call from patient stating that he is scheduled to have a procedure that Dr. Morene Rankins wanted him to have with Urology on  January  2nd and then is going to have a procedure with Dr. Marcello Moores on January the 11th that Dr. Carlean Purl wanted him to have. Patient is requesting a call back to discuss if he is okay to have the procedures that close together. Please advise.

## 2022-06-05 NOTE — Telephone Encounter (Signed)
patient stating that he is scheduled to have a procedure that Dr. Morene Rankins wanted him to have with Urology on January 2nd and then is going to have a procedure with Dr. Marcello Moores on January the 11th that Dr. Carlean Purl wanted him to have.  Pt was questioning if the two surgery's that he has planned for next week are to close together: Pt was recommended that he reach out to both of the surgeons and get there advise and recommendations: Pt verbalized understanding with all questions answered.

## 2022-06-08 ENCOUNTER — Encounter: Payer: Self-pay | Admitting: Internal Medicine

## 2022-06-08 ENCOUNTER — Encounter: Payer: Self-pay | Admitting: Physician Assistant

## 2022-06-08 NOTE — Telephone Encounter (Signed)
Pt stated that he needs more documentation to assist with his short term disability:Pt was encouraged to write a detailed my chart message stating  what he needs to be said in the documentation. Pt to send an additional my chart message:

## 2022-06-08 NOTE — Telephone Encounter (Signed)
Incoming call from patient states he is returning a call from [place]. Please advise

## 2022-06-09 ENCOUNTER — Encounter: Payer: Self-pay | Admitting: Physician Assistant

## 2022-06-09 NOTE — Telephone Encounter (Signed)
Letter faxed to [place] at [employer] at [phone].

## 2022-06-13 ENCOUNTER — Other Ambulatory Visit: Payer: Self-pay | Admitting: Physician Assistant

## 2022-06-16 ENCOUNTER — Telehealth: Payer: Self-pay | Admitting: Pharmacy Technician

## 2022-06-16 ENCOUNTER — Other Ambulatory Visit (HOSPITAL_COMMUNITY): Payer: Self-pay

## 2022-06-16 NOTE — Telephone Encounter (Signed)
Patient Advocate Encounter  Received notification from [place] that prior authorization for HUMIRA 40MG is required.   PA NOT SUBMITTED  Key [id number] CURRENT PA on file until 01/23/2023

## 2022-06-23 ENCOUNTER — Other Ambulatory Visit: Payer: Self-pay

## 2022-06-26 ENCOUNTER — Encounter (HOSPITAL_BASED_OUTPATIENT_CLINIC_OR_DEPARTMENT_OTHER): Payer: Self-pay | Admitting: General Surgery

## 2022-06-26 NOTE — Progress Notes (Signed)
06/26/2022 11:55 AM Spoke w/ via phone for pre-op interview---patient  Lab needs dos---- chem 8, EKG Lab results------in EPIC COVID test -----patient states asymptomatic no test needed Arrive at -------0915 NPO after MN NO Solid Food.  Clear liquids from MN until---0715 Med rec completed Medications to take morning of surgery -----none, xanax if needed  Diabetic medication -----na Patient instructed no nail polish to be worn day of surgery Patient instructed to bring photo id and insurance card day of surgery Patient aware to have Driver (ride ) / caregiver    for 24 hours after surgery -[name] [name] Patient Special Instructions -----na Pre-Op special Istructions -----na Patient verbalized understanding of instructions that were given at this phone interview. Patient denies shortness of breath, chest pain, fever, cough at this phone interview.  Cynthis Purington, Arville Lime

## 2022-07-02 ENCOUNTER — Other Ambulatory Visit: Payer: Self-pay

## 2022-07-02 ENCOUNTER — Ambulatory Visit (HOSPITAL_BASED_OUTPATIENT_CLINIC_OR_DEPARTMENT_OTHER): Payer: BC Managed Care – PPO

## 2022-07-02 ENCOUNTER — Ambulatory Visit (HOSPITAL_BASED_OUTPATIENT_CLINIC_OR_DEPARTMENT_OTHER)
Admission: RE | Admit: 2022-07-02 | Discharge: 2022-07-02 | Disposition: A | Discharge status: Home / Self Care | Payer: BC Managed Care – PPO | Source: Ambulatory Visit | Attending: General Surgery | Admitting: General Surgery

## 2022-07-02 ENCOUNTER — Encounter (HOSPITAL_BASED_OUTPATIENT_CLINIC_OR_DEPARTMENT_OTHER): Payer: Self-pay | Admitting: General Surgery

## 2022-07-02 ENCOUNTER — Encounter (HOSPITAL_BASED_OUTPATIENT_CLINIC_OR_DEPARTMENT_OTHER)
Admission: RE | Disposition: A | Discharge status: Home / Self Care | Payer: Self-pay | Source: Ambulatory Visit | Attending: General Surgery

## 2022-07-02 DIAGNOSIS — I10 Essential (primary) hypertension: Secondary | ICD-10-CM | POA: Diagnosis not present

## 2022-07-02 DIAGNOSIS — Z79899 Other long term (current) drug therapy: Secondary | ICD-10-CM | POA: Diagnosis not present

## 2022-07-02 DIAGNOSIS — Z8249 Family history of ischemic heart disease and other diseases of the circulatory system: Secondary | ICD-10-CM | POA: Diagnosis not present

## 2022-07-02 DIAGNOSIS — G4730 Sleep apnea, unspecified: Secondary | ICD-10-CM | POA: Diagnosis not present

## 2022-07-02 DIAGNOSIS — F418 Other specified anxiety disorders: Secondary | ICD-10-CM | POA: Diagnosis not present

## 2022-07-02 DIAGNOSIS — K602 Anal fissure, unspecified: Secondary | ICD-10-CM | POA: Diagnosis present

## 2022-07-02 DIAGNOSIS — K5090 Crohn's disease, unspecified, without complications: Secondary | ICD-10-CM | POA: Insufficient documentation

## 2022-07-02 HISTORY — PX: SPHINCTEROTOMY: SHX5279

## 2022-07-02 LAB — POCT I-STAT, CHEM 8
BUN: 21 mg/dL — ABNORMAL HIGH (ref 6–20)
Calcium, Ion: 1.2 mmol/L (ref 1.15–1.40)
Chloride: 103 mmol/L (ref 98–111)
Creatinine, Ser: 1.1 mg/dL (ref 0.61–1.24)
Glucose, Bld: 102 mg/dL — ABNORMAL HIGH (ref 70–99)
HCT: 48 % (ref 39.0–52.0)
Hemoglobin: 16.3 g/dL (ref 13.0–17.0)
Potassium: 4.2 mmol/L (ref 3.5–5.1)
Sodium: 138 mmol/L (ref 135–145)
TCO2: 25 mmol/L (ref 22–32)

## 2022-07-02 SURGERY — SPHINCTEROTOMY, ANAL
Anesthesia: Monitor Anesthesia Care | Site: Rectum

## 2022-07-02 MED ORDER — SODIUM CHLORIDE (PF) 0.9 % IJ SOLN
INTRAMUSCULAR | Status: DC | PRN
  Administered 2022-07-02: 50 mL

## 2022-07-02 MED ORDER — ONDANSETRON HCL 4 MG/2ML IJ SOLN
INTRAMUSCULAR | Status: DC | PRN
  Administered 2022-07-02: 4 mg via INTRAVENOUS

## 2022-07-02 MED ORDER — LIDOCAINE 2% (20 MG/ML) 5 ML SYRINGE
INTRAMUSCULAR | Status: DC | PRN
  Administered 2022-07-02: 60 mg via INTRAVENOUS

## 2022-07-02 MED ORDER — OXYCODONE HCL 5 MG PO TABS
ORAL_TABLET | ORAL | Status: AC
  Filled 2022-07-02: qty 1

## 2022-07-02 MED ORDER — TRAMADOL HCL 50 MG PO TABS: 50.0000 mg | ORAL_TABLET | Freq: Four times a day (QID) | ORAL | 0 refills | Status: DC | PRN

## 2022-07-02 MED ORDER — PROPOFOL 500 MG/50ML IV EMUL
INTRAVENOUS | Status: AC
  Filled 2022-07-02: qty 50

## 2022-07-02 MED ORDER — KETAMINE HCL 10 MG/ML IJ SOLN
INTRAMUSCULAR | Status: DC | PRN
  Administered 2022-07-02: 10 mg via INTRAVENOUS
  Administered 2022-07-02: 20 mg via INTRAVENOUS

## 2022-07-02 MED ORDER — OXYCODONE HCL 5 MG PO TABS
5.0000 mg | ORAL_TABLET | Freq: Once | ORAL | Status: AC | PRN
  Administered 2022-07-02: 5 mg via ORAL

## 2022-07-02 MED ORDER — PROPOFOL 500 MG/50ML IV EMUL
INTRAVENOUS | Status: DC | PRN
  Administered 2022-07-02: 200 ug/kg/min via INTRAVENOUS

## 2022-07-02 MED ORDER — 0.9 % SODIUM CHLORIDE (POUR BTL) OPTIME
TOPICAL | Status: DC | PRN
  Administered 2022-07-02: 500 mL

## 2022-07-02 MED ORDER — FENTANYL CITRATE (PF) 100 MCG/2ML IJ SOLN
INTRAMUSCULAR | Status: AC
  Filled 2022-07-02: qty 2

## 2022-07-02 MED ORDER — SODIUM CHLORIDE 0.9% FLUSH: 3.0000 mL | Freq: Two times a day (BID) | INTRAVENOUS | Status: DC

## 2022-07-02 MED ORDER — MIDAZOLAM HCL 2 MG/2ML IJ SOLN
INTRAMUSCULAR | Status: AC
  Filled 2022-07-02: qty 2

## 2022-07-02 MED ORDER — FENTANYL CITRATE (PF) 100 MCG/2ML IJ SOLN
INTRAMUSCULAR | Status: DC | PRN
  Administered 2022-07-02 (×2): 25 ug via INTRAVENOUS

## 2022-07-02 MED ORDER — PROPOFOL 500 MG/50ML IV EMUL: INTRAVENOUS | Status: DC | PRN

## 2022-07-02 MED ORDER — BUPIVACAINE HCL (PF) 0.5 % IJ SOLN
INTRAMUSCULAR | Status: DC | PRN
  Administered 2022-07-02: 30 mL

## 2022-07-02 MED ORDER — LACTATED RINGERS IV SOLN: INTRAVENOUS | Status: DC

## 2022-07-02 MED ORDER — ONDANSETRON HCL 4 MG/2ML IJ SOLN
INTRAMUSCULAR | Status: AC
  Filled 2022-07-02: qty 2

## 2022-07-02 MED ORDER — PROPOFOL 10 MG/ML IV BOLUS
INTRAVENOUS | Status: DC | PRN
  Administered 2022-07-02: 30 mg via INTRAVENOUS

## 2022-07-02 MED ORDER — MIDAZOLAM HCL 2 MG/2ML IJ SOLN
INTRAMUSCULAR | Status: DC | PRN
  Administered 2022-07-02: 2 mg via INTRAVENOUS

## 2022-07-02 MED ORDER — FENTANYL CITRATE (PF) 100 MCG/2ML IJ SOLN: 25.0000 ug | INTRAMUSCULAR | Status: DC | PRN

## 2022-07-02 MED ORDER — OXYCODONE HCL 5 MG/5ML PO SOLN: 5.0000 mg | Freq: Once | ORAL | Status: AC | PRN

## 2022-07-02 MED ORDER — ONABOTULINUMTOXINA 100 UNITS IJ SOLR
INTRAMUSCULAR | Status: DC | PRN
  Administered 2022-07-02: 100 [IU] via INTRAMUSCULAR

## 2022-07-02 MED ORDER — KETAMINE HCL 50 MG/5ML IJ SOSY
PREFILLED_SYRINGE | INTRAMUSCULAR | Status: AC
  Filled 2022-07-02: qty 5

## 2022-07-02 SURGICAL SUPPLY — 33 items
BENZOIN TINCTURE PRP APPL 2/3 (GAUZE/BANDAGES/DRESSINGS) ×1 IMPLANT
BLADE SURG 15 STRL LF DISP TIS (BLADE) ×1 IMPLANT
BLADE SURG 15 STRL SS (BLADE)
BRIEF MESH DISP LRG (UNDERPADS AND DIAPERS) ×1 IMPLANT
COVER BACK TABLE 60X90IN (DRAPES) ×1 IMPLANT
COVER MAYO STAND STRL (DRAPES) ×1 IMPLANT
DRAPE LAPAROTOMY 100X72 PEDS (DRAPES) ×1 IMPLANT
DRAPE UTILITY XL STRL (DRAPES) ×1 IMPLANT
ELECT REM PT RETURN 9FT ADLT (ELECTROSURGICAL)
ELECTRODE REM PT RTRN 9FT ADLT (ELECTROSURGICAL) ×1 IMPLANT
GAUZE 4X4 16PLY ~~LOC~~+RFID DBL (SPONGE) ×1 IMPLANT
GAUZE PAD ABD 8X10 STRL (GAUZE/BANDAGES/DRESSINGS) ×1 IMPLANT
GAUZE SPONGE 4X4 12PLY STRL (GAUZE/BANDAGES/DRESSINGS) IMPLANT
GAUZE SPONGE 4X4 12PLY STRL LF (GAUZE/BANDAGES/DRESSINGS) IMPLANT
GLOVE BIO SURGEON STRL SZ 6.5 (GLOVE) ×1 IMPLANT
GLOVE BIOGEL PI IND STRL 7.0 (GLOVE) ×1 IMPLANT
GLOVE INDICATOR 6.5 STRL GRN (GLOVE) ×1 IMPLANT
KIT SIGMOIDOSCOPE (SET/KITS/TRAYS/PACK) IMPLANT
KIT TURNOVER CYSTO (KITS) ×1 IMPLANT
NDL SAFETY ECLIP 18X1.5 (MISCELLANEOUS) IMPLANT
NEEDLE HYPO 22GX1.5 SAFETY (NEEDLE) ×1 IMPLANT
NS IRRIG 500ML POUR BTL (IV SOLUTION) ×1 IMPLANT
PACK BASIN DAY SURGERY FS (CUSTOM PROCEDURE TRAY) ×1 IMPLANT
PAD ARMBOARD 7.5X6 YLW CONV (MISCELLANEOUS) IMPLANT
SUT CHROMIC 2 0 SH (SUTURE) IMPLANT
SUT CHROMIC 3 0 SH 27 (SUTURE) IMPLANT
SYR CONTROL 10ML LL (SYRINGE) ×1 IMPLANT
TOWEL OR 17X26 10 PK STRL BLUE (TOWEL DISPOSABLE) ×1 IMPLANT
TRAP FLUID SMOKE EVACUATOR (MISCELLANEOUS) ×1 IMPLANT
TUBE CONNECTING 12X1/4 (SUCTIONS) ×1 IMPLANT
UNDERPAD 30X36 HEAVY ABSORB (UNDERPADS AND DIAPERS) ×1 IMPLANT
WATER STERILE IRR 500ML POUR (IV SOLUTION) ×1 IMPLANT
YANKAUER SUCT BULB TIP NO VENT (SUCTIONS) ×1 IMPLANT

## 2022-07-02 NOTE — Anesthesia Postprocedure Evaluation (Signed)
Anesthesia Post Note  Patient: FREEMAN BORBA  Procedure(s) Performed: chemical SPHINCTEROTOMY (Rectum)     Patient location during evaluation: PACU Anesthesia Type: MAC Level of consciousness: awake and alert Pain management: pain level controlled Vital Signs Assessment: post-procedure vital signs reviewed and stable Respiratory status: spontaneous breathing, nonlabored ventilation, respiratory function stable and patient connected to nasal cannula oxygen Cardiovascular status: stable and blood pressure returned to baseline Postop Assessment: no apparent nausea or vomiting Anesthetic complications: no   No notable events documented.  Last Vitals:  Vitals:   07/02/22 1230 07/02/22 1310  BP: 125/81 127/75  Pulse: (!) 57 60  Resp: 10 16  Temp:  36.7 C  SpO2: 95% 95%    Last Pain:  Vitals:   07/02/22 0944  TempSrc: Oral                 Belenda Cruise P Arul Farabee

## 2022-07-02 NOTE — Transfer of Care (Cosign Needed)
Immediate Anesthesia Transfer of Care Note  Patient: [name] [name]  Procedure(s) Performed: chemical SPHINCTEROTOMY (Rectum)  Patient Location: PACU  Anesthesia Type:MAC  Level of Consciousness: awake, alert , and oriented  Airway & Oxygen Therapy: Patient Spontanous Breathing and Patient connected to face mask oxygen  Post-op Assessment: Report given to RN, Post -op Vital signs reviewed and stable, and Patient moving all extremities  Post vital signs: Reviewed and stable  Last Vitals:  Vitals Value Taken Time  BP    Temp    Pulse    Resp    SpO2      Last Pain:  Vitals:   07/02/22 0944  TempSrc: Oral         Complications: No notable events documented.

## 2022-07-02 NOTE — Op Note (Signed)
07/02/2022  12:03 PM  PATIENT:  [name] [name]  47 y.o. male  Patient Care Team: Inda Coke, [place] as PCP - General (Physician Assistant)  PRE-OPERATIVE DIAGNOSIS:  anal fissure  POST-OPERATIVE DIAGNOSIS:  anal fissure  PROCEDURE:   CHEMICAL SPHINCTEROTOMY    Surgeon(s): Leighton Ruff, MD  ASSISTANT: none   ANESTHESIA:   local and MAC  SPECIMEN:  No Specimen  DISPOSITION OF SPECIMEN:  N/A  COUNTS:  YES  PLAN OF CARE: Discharge to home after PACU  PATIENT DISPOSITION:  PACU - hemodynamically stable.  INDICATION: 47 y.o. M with Crohn's and posterior anal fissure   OR FINDINGS: no sphincter hypertension, mostly epithelialized posterior midline fissure, no fistulas noted  DESCRIPTION: the patient was identified in the preoperative holding area and taken to the OR where they were laid on the operating room table.  MAC anesthesia was induced without difficulty. The patient was then positioned in prone jackknife position with buttocks gently taped apart.  The patient was then prepped and draped in usual sterile fashion.  SCDs were noted to be in place prior to the initiation of anesthesia. A surgical timeout was performed indicating the correct patient, procedure, positioning and need for preoperative antibiotics.  A rectal block was performed using Marcaine.    I began with a digital rectal exam.  The patient did not appear to have any sphincter hypertension.  He did have some scar tissue and stricture posteriorly.  This was gently dilated.  I then placed a Hill-Ferguson anoscope into the anal canal and evaluated this completely.  There was no sign of active inflammation.  I injected 100 units of Botox into the intersphincteric groove.  He tolerated this well.  A dressing was applied.  Awakened from anesthesia and sent to the postanesthesia care unit in stable condition.  All counts were correct per operating room staff.  Rosario Adie, MD  Colorectal [facility] [place] [facility]

## 2022-07-02 NOTE — Anesthesia Preprocedure Evaluation (Signed)
Anesthesia Evaluation  Patient identified by MRN, date of birth, ID band Patient awake    Reviewed: Allergy & Precautions, NPO status , Patient's Chart, lab work & pertinent test results  Airway Mallampati: II  TM Distance: >3 FB Neck ROM: Full    Dental no notable dental hx.    Pulmonary sleep apnea    Pulmonary exam normal        Cardiovascular hypertension, Pt. on medications  Rhythm:Regular Rate:Normal     Neuro/Psych   Anxiety Depression    negative neurological ROS     GI/Hepatic Neg liver ROS,,,Anal fissure   Endo/Other  negative endocrine ROS    Renal/GU negative Renal ROS  negative genitourinary   Musculoskeletal negative musculoskeletal ROS (+)    Abdominal Normal abdominal exam  (+)   Peds  Hematology negative hematology ROS (+)   Anesthesia Other Findings   Reproductive/Obstetrics                             Anesthesia Physical Anesthesia Plan  ASA: 2  Anesthesia Plan: MAC   Post-op Pain Management:    Induction: Intravenous  PONV Risk Score and Plan: 1 and Ondansetron, Dexamethasone, Propofol infusion, Midazolam and Treatment may vary due to age or medical condition  Airway Management Planned: Simple Face Mask, Natural Airway and Nasal Cannula  Additional Equipment: None  Intra-op Plan:   Post-operative Plan:   Informed Consent: I have reviewed the patients History and Physical, chart, labs and discussed the procedure including the risks, benefits and alternatives for the proposed anesthesia with the patient or authorized representative who has indicated his/her understanding and acceptance.     Dental advisory given  Plan Discussed with: CRNA  Anesthesia Plan Comments:        Anesthesia Quick Evaluation

## 2022-07-02 NOTE — H&P (Signed)
REFERRING PHYSICIAN:  Asencion Partridge, *   PROVIDER:  Monico Blitz, MD   MRN: [id number] DOB: [birth year] DATE OF ENCOUNTER: 04/29/2022   Subjective    Chief Complaint: No chief complaint on file.       History of Present Illness: [name] [name] is a 47 y.o. male who is seen today as an office consultation at the request of Dr. Morene Rankins for evaluation of No chief complaint on file. .  Patient presented to his primary care physician with rectal pain in October.  He stated that it had been recurring for approximately 1 month.  He has a history of an anal fistula.  Patient has Crohn's disease and was seen in the office in 2019 for an anal fissure.  He was taken to the OR in July 2019 for chemical sphincterotomy.  He was noted to have a small overlying fistula that was divided.     Review of Systems: A complete review of systems was obtained from the patient.  I have reviewed this information and discussed as appropriate with the patient.  See HPI as well for other ROS.     Medical History: Past Medical History      Past Medical History:  Diagnosis Date   Anxiety     Hyperlipidemia     Hypertension          There is no problem list on file for this patient.     Past Surgical History       Past Surgical History:  Procedure Laterality Date   abdominal blockage            Allergies  No Known Allergies           Current Outpatient Medications on File Prior to Visit  Medication Sig Dispense Refill   ALPRAZolam (XANAX XR) 3 MG 24 hr tablet Take 1 tablet by mouth every morning       BD INTEGRA SYRINGE 3 mL 25 gauge x 1" Syrg as directed       busPIRone (BUSPAR) 30 MG tablet Take by mouth       emtricitabine-tenofovir, TDF, (TRUVADA) 200-300 mg tablet Take 1 tablet by mouth once daily       HUMIRA,CF, PEN 40 mg/0.4 mL pen injector kit INJECT 40 MG UNDER THE SKIN ONCE A WEEK       lisinopriL-hydroCHLOROthiazide (ZESTORETIC) 20-25 mg tablet Take 1 tablet by  mouth once daily       montelukast (SINGULAIR) 10 mg tablet Take 10 mg by mouth at bedtime       zolpidem (AMBIEN) 10 mg tablet Take 1 tablet by mouth at bedtime as needed       gabapentin (NEURONTIN) 600 MG tablet Take by mouth       vortioxetine (TRINTELLIX) 10 mg tablet Take 10 mg by mouth once daily        No current facility-administered medications on file prior to visit.      Family History       Family History  Problem Relation Age of Onset   Obesity Mother     Hyperlipidemia (Elevated cholesterol) Father     High blood pressure (Hypertension) Father     Obesity Father          Social History       Tobacco Use  Smoking Status Never  Smokeless Tobacco Never      Social History  Social History        Socioeconomic History  Marital status: Single  Tobacco Use   Smoking status: Never   Smokeless tobacco: Never  Substance and Sexual Activity   Alcohol use: Yes   Drug use: Never        Objective:         BP 119/78   Pulse 68   Temp 98.9 F (37.2 C) (Oral)   Resp 16   Ht 5\' 9"  (1.753 m)   Wt 100.3 kg   SpO2 99%   BMI 32.67 kg/m     Exam Gen: NAD Abd: soft Rectal: Multiple posterior anal shallow fissures with webbing of skin bridges consistent with inflammatory process of Crohn's.     Labs, Imaging and Diagnostic Testing:     Assessment and Plan:  Diagnoses and all orders for this visit:   Anal fissure   Patient has an anal fissure.  He has been using diltiazem ointment with no relief.  We discussed performing an exam under anesthesia to see if there is something surgical that can be done.  I am hesitant to do a chemical sphincterotomy as the patient does not have any sphincter hypertension and has chronically loose stools, which would make him relatively incontinent for a while.  He is ok with this and aware that he may have trouble with continence.  He is also aware that this may not heal his fissure if it's related to his Crohn's  inflammation.     Rosario Adie, MD Colon and Rectal Surgery [facility]

## 2022-07-02 NOTE — Discharge Instructions (Addendum)
Beginning the day after surgery:  You may sit in a tub of warm water 2-3 times a day to relieve discomfort.  Eat a regular diet high in fiber.  Avoid foods that give you constipation or diarrhea.  Avoid foods that are difficult to digest, such as seeds, nuts, corn or popcorn.  Do not go any longer than 2 days without a bowel movement.  You may take a dose of Milk of Magnesia if you become constipated.    Drink 6-8 glasses of water daily.  Walking is encouraged.  Avoid strenuous activity and heavy lifting for one month after surgery.    Call the office if you have any questions or concerns.  Call immediately if you develop:  Excessive rectal bleeding (more than a cup or passing large clots) Increased discomfort Fever greater than 100 F Difficulty urinating  Post Anesthesia Home Care Instructions  Activity: Get plenty of rest for the remainder of the day. A responsible adult should stay with you for 24 hours following the procedure.  For the next 24 hours, DO NOT: -Drive a car -Operate machinery -Drink alcoholic beverages -Take any medication unless instructed by your physician -Make any legal decisions or sign important papers.  Meals: Start with liquid foods such as gelatin or soup. Progress to regular foods as tolerated. Avoid greasy, spicy, heavy foods. If nausea and/or vomiting occur, drink only clear liquids until the nausea and/or vomiting subsides. Call your physician if vomiting continues.  Special Instructions/Symptoms: Your throat may feel dry or sore from the anesthesia or the breathing tube placed in your throat during surgery. If this causes discomfort, gargle with warm salt water. The discomfort should disappear within 24 hours.      

## 2022-07-06 ENCOUNTER — Encounter (HOSPITAL_BASED_OUTPATIENT_CLINIC_OR_DEPARTMENT_OTHER): Payer: Self-pay | Admitting: General Surgery

## 2022-07-07 ENCOUNTER — Other Ambulatory Visit: Payer: Self-pay | Admitting: Internal Medicine

## 2022-07-08 NOTE — Telephone Encounter (Signed)
Humira should continue as before  Let me know if you need additional info

## 2022-07-08 NOTE — Telephone Encounter (Signed)
Please review the request for the refill for the Humira and advise  Pt recently had surgery. See Notes

## 2022-08-09 ENCOUNTER — Other Ambulatory Visit: Payer: Self-pay | Admitting: Physician Assistant

## 2022-08-09 ENCOUNTER — Other Ambulatory Visit: Payer: Self-pay | Admitting: Internal Medicine

## 2022-08-09 DIAGNOSIS — Z79899 Other long term (current) drug therapy: Secondary | ICD-10-CM

## 2022-08-11 ENCOUNTER — Encounter: Payer: Self-pay | Admitting: Internal Medicine

## 2022-08-13 ENCOUNTER — Other Ambulatory Visit: Payer: Self-pay | Admitting: Internal Medicine

## 2022-08-13 MED ORDER — HYDROCODONE-ACETAMINOPHEN 5-325 MG PO TABS: 1.0000 | ORAL_TABLET | Freq: Four times a day (QID) | ORAL | 0 refills | Status: DC | PRN

## 2022-08-13 NOTE — Telephone Encounter (Signed)
[name] [name] - patient you helped in May w/ abdominal wall injection and sounds like you could help him again w/ appointment  Thanks!  Glendell Docker

## 2022-08-18 ENCOUNTER — Other Ambulatory Visit: Payer: Self-pay | Admitting: Internal Medicine

## 2022-08-18 NOTE — Telephone Encounter (Signed)
It looks like this was refilled on 08/10/22 - can you check with Suvan on this?

## 2022-08-18 NOTE — Telephone Encounter (Signed)
Please look at refill request Sir, thank you.

## 2022-08-19 NOTE — Telephone Encounter (Signed)
I left [name] [name] a detailed message to call me back.

## 2022-08-19 NOTE — Telephone Encounter (Signed)
[name] [name] said he last filled the lomotil 06/19/2022. He wants Dr Carlean Purl to know he has an appointment with Dr Melanee Left April 17th. He only has 7 pain pills left and he is trying hard to spread them out however he said he may call and request more before his April appointment.

## 2022-08-24 ENCOUNTER — Other Ambulatory Visit: Payer: Self-pay | Admitting: Internal Medicine

## 2022-08-24 MED ORDER — HYDROCODONE-ACETAMINOPHEN 5-325 MG PO TABS: 1.0000 | ORAL_TABLET | Freq: Four times a day (QID) | ORAL | 0 refills | Status: DC | PRN

## 2022-08-24 NOTE — Progress Notes (Signed)
Refill on pain meds until abdominal wall injection appt

## 2022-09-01 ENCOUNTER — Other Ambulatory Visit: Payer: Self-pay | Admitting: Physician Assistant

## 2022-09-10 ENCOUNTER — Other Ambulatory Visit: Payer: Self-pay | Admitting: Physician Assistant

## 2022-10-05 ENCOUNTER — Encounter: Payer: Self-pay | Admitting: Internal Medicine

## 2022-10-05 ENCOUNTER — Other Ambulatory Visit: Payer: Self-pay | Admitting: Internal Medicine

## 2022-10-06 ENCOUNTER — Other Ambulatory Visit: Payer: Self-pay | Admitting: Internal Medicine

## 2022-10-06 MED ORDER — HYDROCODONE-ACETAMINOPHEN 5-325 MG PO TABS: 1.0000 | ORAL_TABLET | Freq: Four times a day (QID) | ORAL | 0 refills | Status: DC | PRN

## 2022-10-07 ENCOUNTER — Ambulatory Visit: Payer: BC Managed Care – PPO | Admitting: Gastroenterology

## 2022-10-13 ENCOUNTER — Telehealth: Payer: Self-pay

## 2022-10-13 NOTE — Telephone Encounter (Signed)
-----   Message from Shellia Cleverly, DO sent at 10/13/2022 12:15 PM EDT ----- Regarding: RE: earlier appt? Sure thing.  Victorine Mcnee, can you find a spot for this patient for abdominal wall injection. Ok to [employer] into 10:00 or 2:40, or can take a hemorrhoid banding spot if there's one available.    ----- Message ----- From: Iva Boop, MD Sent: 10/13/2022  11:41 AM EDT To: Shellia Cleverly, DO Subject: earlier appt?                                  This guy had to reschedule his injection appt - any place to squeeze him in sooner?  Thanks  Baldo Ash

## 2022-10-13 NOTE — Telephone Encounter (Signed)
Called patient and LVM in regards to reschedule his appointment in sooner date.

## 2022-10-13 NOTE — Telephone Encounter (Signed)
Called patient and LVM again in regards to reschedule his appointment in a sooner date. Sent detailed MyChart message stating that we have one cancellation tomorrow at 1:40 to see if he can come.

## 2022-10-24 ENCOUNTER — Other Ambulatory Visit: Payer: Self-pay | Admitting: Physician Assistant

## 2022-10-30 ENCOUNTER — Ambulatory Visit: Payer: BC Managed Care – PPO | Admitting: Gastroenterology

## 2022-11-04 ENCOUNTER — Other Ambulatory Visit: Payer: Self-pay | Admitting: Physician Assistant

## 2022-11-04 DIAGNOSIS — Z79899 Other long term (current) drug therapy: Secondary | ICD-10-CM

## 2022-11-14 ENCOUNTER — Encounter: Payer: Self-pay | Admitting: Physician Assistant

## 2022-11-18 ENCOUNTER — Other Ambulatory Visit: Payer: Self-pay | Admitting: Physician Assistant

## 2022-12-06 ENCOUNTER — Other Ambulatory Visit: Payer: Self-pay | Admitting: Physician Assistant

## 2022-12-14 ENCOUNTER — Telehealth: Payer: Self-pay | Admitting: Internal Medicine

## 2022-12-14 NOTE — Telephone Encounter (Signed)
Left message for pt to call back  °

## 2022-12-14 NOTE — Telephone Encounter (Signed)
Inbound call from patient , he is wanting  to see if he can get any type of medication to relive pain, he said he is taking tylenol but is not been working for him ;  his next apt will be 8/15

## 2022-12-14 NOTE — Telephone Encounter (Signed)
Patient is returning your call.  

## 2022-12-15 ENCOUNTER — Other Ambulatory Visit: Payer: Self-pay

## 2022-12-15 DIAGNOSIS — R1011 Right upper quadrant pain: Secondary | ICD-10-CM

## 2022-12-15 DIAGNOSIS — K50813 Crohn's disease of both small and large intestine with fistula: Secondary | ICD-10-CM

## 2022-12-15 DIAGNOSIS — K602 Anal fissure, unspecified: Secondary | ICD-10-CM

## 2022-12-15 NOTE — Telephone Encounter (Signed)
I tried to order the Norco through Epic but was unsuccessful. I called pt pharmacy and tried to give a verbal and they stated that it could not be called in or faxed and that it had to be written.  Please assist with the ordering of pain medication.  Spoke with Nabina in regard to scheduling pt a sooner appointment.

## 2022-12-15 NOTE — Telephone Encounter (Signed)
Pt stated that he was referred by Dr. Leone Payor to Dr. Barron Alvine  to receive a shot in his side. Pt stated that he had to cancel his recent appointment that was scheduled with Dr. Barron Alvine due to the passing away of his Olene Floss and is now scheduled for August 15th. Pt stated that he took the last of his pain meds that was prescribed by Dr. Leone Payor over the weekend and was requesting if he could be prescribed more pain meds until his office visit.  Please advise

## 2022-12-16 ENCOUNTER — Telehealth: Payer: Self-pay | Admitting: Gastroenterology

## 2022-12-16 DIAGNOSIS — G589 Mononeuropathy, unspecified: Secondary | ICD-10-CM

## 2022-12-16 MED ORDER — OXYCODONE-ACETAMINOPHEN 5-325 MG PO TABS: 1.0000 | ORAL_TABLET | Freq: Four times a day (QID) | ORAL | Status: DC | PRN

## 2022-12-16 MED ORDER — HYDROCODONE-ACETAMINOPHEN 5-325 MG PO TABS: 1.0000 | ORAL_TABLET | Freq: Four times a day (QID) | ORAL | 0 refills | Status: DC | PRN

## 2022-12-16 NOTE — Addendum Note (Signed)
Addended by: Shellia Cleverly on: 12/16/2022 04:36 PM   Modules accepted: Orders

## 2022-12-16 NOTE — Telephone Encounter (Signed)
Pt was made aware of prescription that was sent to pharmacy and notified of the appointment on 01/28/2023 at 10:00 with Dr. Barron Alvine.  Pt verbalized understanding with all questions answered.

## 2022-12-16 NOTE — Telephone Encounter (Signed)
Pt made aware that prescription has been sent to pharmacy.  Pt verbalized understanding with all questions answered.

## 2022-12-16 NOTE — Telephone Encounter (Signed)
MyChart message sent to the patient in regards to pain med and f/u appointment.

## 2022-12-16 NOTE — Addendum Note (Signed)
Addended by: Shellia Cleverly on: 12/16/2022 05:10 PM   Modules accepted: Orders

## 2022-12-16 NOTE — Telephone Encounter (Signed)
Refill placed for Oxycodone, #15.

## 2022-12-16 NOTE — Telephone Encounter (Signed)
[phone] on [address] at corner of [address].

## 2022-12-18 ENCOUNTER — Ambulatory Visit: Payer: BC Managed Care – PPO | Admitting: Physician Assistant

## 2022-12-25 ENCOUNTER — Ambulatory Visit: Payer: BC Managed Care – PPO | Admitting: Physician Assistant

## 2022-12-26 ENCOUNTER — Other Ambulatory Visit: Payer: Self-pay | Admitting: Physician Assistant

## 2023-01-01 ENCOUNTER — Encounter: Payer: Self-pay | Admitting: Physician Assistant

## 2023-01-01 ENCOUNTER — Ambulatory Visit (INDEPENDENT_AMBULATORY_CARE_PROVIDER_SITE_OTHER): Payer: BC Managed Care – PPO | Admitting: Physician Assistant

## 2023-01-01 ENCOUNTER — Other Ambulatory Visit (HOSPITAL_COMMUNITY)
Admission: RE | Admit: 2023-01-01 | Discharge: 2023-01-01 | Disposition: A | Discharge status: Home / Self Care | Payer: BC Managed Care – PPO | Source: Ambulatory Visit | Attending: Physician Assistant | Admitting: Physician Assistant

## 2023-01-01 VITALS — BP 120/80 | HR 76 | Temp 98.0°F | Ht 69.0 in | Wt 229.2 lb

## 2023-01-01 DIAGNOSIS — E669 Obesity, unspecified: Secondary | ICD-10-CM | POA: Diagnosis not present

## 2023-01-01 DIAGNOSIS — F418 Other specified anxiety disorders: Secondary | ICD-10-CM

## 2023-01-01 DIAGNOSIS — Z0001 Encounter for general adult medical examination with abnormal findings: Secondary | ICD-10-CM

## 2023-01-01 DIAGNOSIS — Z79899 Other long term (current) drug therapy: Secondary | ICD-10-CM

## 2023-01-01 DIAGNOSIS — E785 Hyperlipidemia, unspecified: Secondary | ICD-10-CM

## 2023-01-01 DIAGNOSIS — E538 Deficiency of other specified B group vitamins: Secondary | ICD-10-CM

## 2023-01-01 DIAGNOSIS — I10 Essential (primary) hypertension: Secondary | ICD-10-CM

## 2023-01-01 DIAGNOSIS — Z7960 Long term (current) use of unspecified immunomodulators and immunosuppressants: Secondary | ICD-10-CM

## 2023-01-01 DIAGNOSIS — R1111 Vomiting without nausea: Secondary | ICD-10-CM

## 2023-01-01 DIAGNOSIS — Z6833 Body mass index (BMI) 33.0-33.9, adult: Secondary | ICD-10-CM

## 2023-01-01 DIAGNOSIS — J4540 Moderate persistent asthma, uncomplicated: Secondary | ICD-10-CM

## 2023-01-01 DIAGNOSIS — E559 Vitamin D deficiency, unspecified: Secondary | ICD-10-CM | POA: Diagnosis not present

## 2023-01-01 LAB — CBC WITH DIFFERENTIAL/PLATELET
Basophils Absolute: 0 10*3/uL (ref 0.0–0.1)
Basophils Relative: 0.6 % (ref 0.0–3.0)
Eosinophils Absolute: 0.1 10*3/uL (ref 0.0–0.7)
Eosinophils Relative: 1 % (ref 0.0–5.0)
HCT: 47 % (ref 39.0–52.0)
Hemoglobin: 15.6 g/dL (ref 13.0–17.0)
Lymphocytes Relative: 34.8 % (ref 12.0–46.0)
Lymphs Abs: 2.5 10*3/uL (ref 0.7–4.0)
MCHC: 33.1 g/dL (ref 30.0–36.0)
MCV: 101.1 fl — ABNORMAL HIGH (ref 78.0–100.0)
Monocytes Absolute: 0.8 10*3/uL (ref 0.1–1.0)
Monocytes Relative: 10.5 % (ref 3.0–12.0)
Neutro Abs: 3.9 10*3/uL (ref 1.4–7.7)
Neutrophils Relative %: 53.1 % (ref 43.0–77.0)
Platelets: 221 10*3/uL (ref 150.0–400.0)
RBC: 4.65 Mil/uL (ref 4.22–5.81)
RDW: 13.9 % (ref 11.5–15.5)
WBC: 7.3 10*3/uL (ref 4.0–10.5)

## 2023-01-01 LAB — COMPREHENSIVE METABOLIC PANEL
ALT: 20 U/L (ref 0–53)
AST: 19 U/L (ref 0–37)
Albumin: 5 g/dL (ref 3.5–5.2)
Alkaline Phosphatase: 69 U/L (ref 39–117)
BUN: 21 mg/dL (ref 6–23)
CO2: 28 mEq/L (ref 19–32)
Calcium: 10.1 mg/dL (ref 8.4–10.5)
Chloride: 97 mEq/L (ref 96–112)
Creatinine, Ser: 1.22 mg/dL (ref 0.40–1.50)
GFR: 70.44 mL/min (ref >60.00)
Glucose, Bld: 104 mg/dL — ABNORMAL HIGH (ref 70–99)
Potassium: 4.3 mEq/L (ref 3.5–5.1)
Sodium: 134 mEq/L — ABNORMAL LOW (ref 135–145)
Total Bilirubin: 0.7 mg/dL (ref 0.2–1.2)
Total Protein: 8.2 g/dL (ref 6.0–8.3)

## 2023-01-01 LAB — LIPID PANEL
Cholesterol: 180 mg/dL (ref 0–200)
HDL: 48.1 mg/dL (ref >39.00)
LDL Cholesterol: 110 mg/dL — ABNORMAL HIGH (ref 0–99)
NonHDL: 131.79
Total CHOL/HDL Ratio: 4
Triglycerides: 111 mg/dL (ref 0.0–149.0)
VLDL: 22.2 mg/dL (ref 0.0–40.0)

## 2023-01-01 LAB — VITAMIN D 25 HYDROXY (VIT D DEFICIENCY, FRACTURES): VITD: 44.37 ng/mL (ref 30.00–100.00)

## 2023-01-01 LAB — VITAMIN B12: Vitamin B-12: 1323 pg/mL — ABNORMAL HIGH (ref 211–911)

## 2023-01-01 LAB — TSH: TSH: 1.19 u[IU]/mL (ref 0.35–5.50)

## 2023-01-01 LAB — HEMOGLOBIN A1C: Hgb A1c MFr Bld: 5.8 % (ref 4.6–6.5)

## 2023-01-01 NOTE — Progress Notes (Signed)
Subjective:    [name] [name] is a 47 y.o. male and is here for a comprehensive physical exam.  HPI  There are no preventive care reminders to display for this patient.  Acute Concerns: Emesis: Reports that a couple months ago he has been throwing up 3-4/week for seemingly no reason, and is experiencing abdominal pain (right side) He believes it may be associated with taking Trintellix before eating, since he used anti-nausea medication when first he first started, but they caused drowsiness. States he does not want to change mental health medication. He has an upcoming follow-up with Dr. Stan Head MD in gastroenterology, and will discuss this with him further.  Chronic Issues: Crohn's: Compliant with 40 mg Humira. Tolerating well.  HIV pre-exposure Prophylaxis: Compliant with 200-300 mg Truvada. Tolerating well.   Asthma Currently using QVAR 2 puffs in am and pm Compliant with 10 mg Singulair, states he had to go 9 days without it and feels like he was using it more than needed once he refilled.  Anxiety and Depression Compliant with 20 mg Trintellix, believe it may be associated with his recent episodes of emesis. Compliant with Deplin 15 PO. Tolerating well.  No risk of SI/HI. Continues on xanax, but overall decreasing intake with psychiatry, Dr Evelene Croon  Health Maintenance: Immunizations -- UTD Colonoscopy -- Endoscopy last done 05/19/22. Crohn's and anastomotic ulcer found, otherwise normal. PSA --  Lab Results  Component Value Date   PSA 0.41 11/21/2020   PSA 0.3 11/29/2019   PSA 0.40 07/07/2018   Diet -- Healthy overall. Sleep habits -- No concerns, naps for 1.5 hours when possible, states he always feels tired Exercise -- Has not been exercising regularly.  Weight --  Recent weight history Wt Readings from Last 10 Encounters:  01/01/23 229 lb 4 oz (104 kg)  07/02/22 221 lb 3.2 oz (100.3 kg)  05/19/22 221 lb (100.2 kg)  05/13/22 223 lb (101.2 kg)   04/27/22 225 lb 6.1 oz (102.2 kg)  04/15/22 218 lb 6.1 oz (99.1 kg)  04/02/22 221 lb 8 oz (100.5 kg)  04/01/22 221 lb (100.2 kg)  02/24/22 216 lb (98 kg)  02/09/22 220 lb 3.2 oz (99.9 kg)   Body mass index is 33.85 kg/m.  Mood -- Seeing Hoy Register [facility], states he enjoys meeting with her. Alcohol use --  reports current alcohol use of about 3.0 standard drinks of alcohol per week.  Tobacco use --  Tobacco Use: Low Risk  (01/01/2023)   Patient History    Smoking Tobacco Use: Never    Smokeless Tobacco Use: Never    Passive Exposure: Not on file    Eligible for Low Dose CT? No  UTD with eye doctor? Yes UTD with dentist? No     01/01/2023    8:01 AM  Depression screen PHQ 2/9  Decreased Interest 1  Down, Depressed, Hopeless 1  PHQ - 2 Score 2  Altered sleeping 1  Tired, decreased energy 1  Change in appetite 1  Feeling bad or failure about yourself  1  Trouble concentrating 1  Moving slowly or fidgety/restless 1  Suicidal thoughts 0  PHQ-9 Score 8  Difficult doing work/chores Somewhat difficult    Other providers/specialists: Patient Care Team: Jarold Motto, [place] as PCP - General (Physician Assistant)    PMHx, SurgHx, SocialHx, Medications, and Allergies were reviewed in the Visit Navigator and updated as appropriate.   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   COVID-19 06/2021   Crohn's disease (HCC) followed by dr Leone Payor   small and large intestines--  hx bowel resection 02/ 2009--  currently treated w/ humira   Essential hypertension, benign    History of CMV 10/14/2010   resolved w/ antiviral therapy   History of kidney stones    History of resection of small bowel    Hyperlipidemia    Major depressive disorder    OSA (obstructive sleep apnea)    01-05-2018 per pt resolved since  tonsils removed    Ventral hernia    Vitamin D deficiency      Past Surgical History:  Procedure Laterality Date   ANAL FISTULOTOMY N/A 01/07/2018   Procedure: ANAL FISTULOTOMY;  Surgeon: Romie Levee, MD;  Location: [facility];  Service: General;  Laterality: N/A;   COLONOSCOPY  last one 04-2017   dr Leone Payor   EXPLORATORY LAPAROTOMY W/ ILEOSECECTOMY W/ PRIMARY ANASTAMOSIS  08-05-2007    dr gross  [facility]   crohn's, sbo, perferation's   GANGLION CYST EXCISION Right 2016   wrist   HYPOSPADIAS CORRECTION  CHILD   SPHINCTEROTOMY N/A 01/07/2018   Procedure: CHEMICAL SPHINCTEROTOMY (BOTOX);  Surgeon: Romie Levee, MD;  Location[facility];  Service: General;  Laterality: N/A;   SPHINCTEROTOMY N/A 07/02/2022   Procedure: chemical SPHINCTEROTOMY;  Surgeon: Romie Levee, MD;  Location: [facility];  Service: General;  Laterality: N/A;   TONSILLECTOMY Bilateral 09/30/2015   Procedure: BILATERAL TONSILLECTOMY;  Surgeon: Serena Colonel, MD;  Location: [facility];  Service: ENT;  Laterality: Bilateral;   UPPER GASTROINTESTINAL ENDOSCOPY  10/15/2010   w/biopsy, mild gastritis and duodenitis     Family History  Problem Relation Age of Onset   Obesity Mother    Allergies Mother    Multiple sclerosis Mother    Heart disease Father        CAD/MI   Hyperlipidemia Father    Hypertension Father    Gout Father    Sleep apnea Father    Myasthenia gravis Maternal Grandfather    Stroke Maternal Grandfather    Stroke Paternal Grandmother 7   Pancreatic cancer Paternal Grandfather 55   Colon cancer Neg Hx    Diabetes Neg Hx    COPD Neg Hx    Colon polyps Neg Hx    Stomach cancer Neg Hx     Social History   Tobacco Use   Smoking status: Never   Smokeless tobacco: Never  Vaping Use   Vaping status: Never Used  Substance Use Topics   Alcohol use: Yes    Alcohol/week: 3.0 standard drinks of alcohol    Types: 3 Shots of liquor per week    Comment:  rare   Drug use: Yes    Types: Marijuana    Review of Systems:   Review of Systems  Constitutional:  Negative for chills, fever, malaise/fatigue and weight loss.  HENT:  Negative for hearing loss, sinus pain and sore throat.   Respiratory:  Negative for cough and hemoptysis.   Cardiovascular:  Negative for chest pain, palpitations, leg swelling and PND.  Gastrointestinal:  Positive for vomiting (-See HPI). Negative for abdominal pain, constipation, diarrhea, heartburn and nausea.  Genitourinary:  Negative for dysuria, frequency and urgency.  Musculoskeletal:  Negative for back pain, myalgias and neck pain.  Skin:  Negative for itching and rash.  Neurological:  Negative for dizziness, tingling, seizures and headaches.  Endo/Heme/Allergies:  Negative for polydipsia.  Psychiatric/Behavioral:  Negative for depression. The patient is not nervous/anxious.     Objective:    Vitals:   01/01/23 0800  BP: 120/80  Pulse: 76  Temp: 98 F (36.7 C)  SpO2: 97%    Body mass index is 33.85 kg/m.  General  Alert, cooperative, no distress, appears stated age  Head:  Normocephalic, without obvious abnormality, atraumatic  Eyes:  PERRL, conjunctiva/corneas clear, EOM's intact, fundi benign, both eyes       Ears:  Normal TM's and external ear canals, both ears  Nose: Nares normal, septum midline, mucosa normal, no drainage or sinus tenderness  Throat: Lips, mucosa, and tongue normal; teeth and gums normal  Neck: Supple, symmetrical, trachea midline, no adenopathy;     thyroid:  No enlargement/tenderness/nodules; no carotid bruit or JVD  Back:   Symmetric, no curvature, ROM normal, no CVA tenderness  Lungs:   Clear to auscultation bilaterally, respirations unlabored  Chest wall:  No tenderness or deformity  Heart:  Regular rate and rhythm, S1 and S2 normal, no murmur, rub or gallop  Abdomen:   Soft, non-tender, bowel sounds active all four quadrants, no masses, no organomegaly   Extremities: Extremities normal, atraumatic, no cyanosis or edema  Prostate : Deferred   Skin: Skin color, texture, turgor normal, no rashes or lesions  Lymph nodes: Cervical, supraclavicular, and axillary nodes normal  Neurologic: CNII-XII grossly intact. Normal strength, sensation and reflexes throughout   AssessmentPlan:   Encounter for general adult medical examination with abnormal findings Today patient counseled on age appropriate routine health concerns for screening and prevention, each reviewed and up to date or declined. Immunizations reviewed and up to date or declined. Labs ordered and reviewed. Risk factors for depression reviewed and negative. Hearing function and visual acuity are intact. ADLs screened and addressed as needed. Functional ability and level of safety reviewed and appropriate. Education, counseling and referrals performed based on assessed risks today. Patient provided with a copy of personalized plan for preventive services.  Anxiety with depression Reviewed regimen Well controlled  Management per psychiatry  Vomiting without nausea, unspecified vomiting type Unclear etiology Possibly associated with his trintellix however he doesn't want to change this Recommend close follow-up with his gastroenterology provider  Hyperlipidemia, unspecified hyperlipidemia type Update lipid panel and adjust crestor 20 mg daily as indicated  Essential hypertension, benign Normotensive Continue lisinopril-hydrochloroTHIAZIDE 20-25 mg Follow-up in 1 year, sooner if concerns  Moderate persistent asthma, unspecified whether complicated Well controlled Continue qvar twice daily and singulair Follow-up prn  On pre-exposure prophylaxis for HIV Denies concerns with Truvada -- will update blood work so we can continue rx  B12 deficiency Update B12 and provide recommendations  Vitamin D deficiency Update Vitamin D and provide recommendations  Obesity, unspecified  classification, unspecified obesity type, unspecified whether serious comorbidity present Continue efforts at healthy lifestyle  Long-term use of immunosuppressant medication - Humira Will check humira labs per his request and fwd to GI    I,Emily Lagle,acting as a scribe for [employer], PA.,have documented all relevant documentation on the behalf of Jarold Motto, PA,as directed by  Jarold Motto, PA while in the presence of Jarold Motto, [place].  I, Jarold Motto, [place], have reviewed all documentation for this visit. The documentation on 01/01/23 for the exam, diagnosis, procedures, and orders are all accurate and complete.   Jarold Motto, [name] [place]  [place]

## 2023-01-01 NOTE — Patient Instructions (Signed)
It was great to see you! ? ?Please go to the lab for blood work.  ? ?Our office will call you with your results unless you have chosen to receive results via MyChart. ? ?If your blood work is normal we will follow-up each year for physicals and as scheduled for chronic medical problems. ? ?If anything is abnormal we will treat accordingly and get you in for a follow-up. ? ?Take care, ? ?Timur Nibert ?  ? ? ?

## 2023-01-01 NOTE — Progress Notes (Signed)
The test results show that your current treatment is OK, as the tests are stable.  Please continue the same plan.  There is no other need for change of treatment or further evaluation based on these results, at this time.  thanks 

## 2023-01-04 ENCOUNTER — Other Ambulatory Visit: Payer: Self-pay | Admitting: Physician Assistant

## 2023-01-04 LAB — URINE CYTOLOGY ANCILLARY ONLY
Chlamydia: NEGATIVE
Comment: NEGATIVE
Comment: NEGATIVE
Comment: NORMAL
Neisseria Gonorrhea: NEGATIVE
Trichomonas: NEGATIVE

## 2023-01-04 MED ORDER — ROSUVASTATIN CALCIUM 40 MG PO TABS: 40.0000 mg | ORAL_TABLET | Freq: Every day | ORAL | 3 refills | Status: DC

## 2023-01-05 LAB — HIV ANTIBODY (ROUTINE TESTING W REFLEX): HIV 1&2 Ab, 4th Generation: NONREACTIVE

## 2023-01-05 LAB — RPR: RPR Ser Ql: NONREACTIVE

## 2023-01-08 LAB — ADALIMUMAB+AB (SERIAL MONITOR): Adalimumab Drug Level: 11 ug/mL

## 2023-01-10 LAB — SERIAL MONITORING

## 2023-01-11 LAB — ADALIMUMAB+AB (SERIAL MONITOR): Anti-Adalimumab Antibody: <25 ng/mL

## 2023-01-13 ENCOUNTER — Telehealth: Payer: Self-pay | Admitting: Physician Assistant

## 2023-01-13 NOTE — Telephone Encounter (Signed)
FYI, see message. 

## 2023-01-13 NOTE — Telephone Encounter (Signed)
Caller is Melissa from [employer]. States that if patient/PCP team need any support in regards to his health then they can be reached at [phone].

## 2023-01-14 ENCOUNTER — Encounter: Payer: Self-pay | Admitting: Internal Medicine

## 2023-01-15 ENCOUNTER — Ambulatory Visit: Payer: BC Managed Care – PPO | Admitting: Gastroenterology

## 2023-01-28 ENCOUNTER — Ambulatory Visit (INDEPENDENT_AMBULATORY_CARE_PROVIDER_SITE_OTHER): Payer: BC Managed Care – PPO | Admitting: Gastroenterology

## 2023-01-28 ENCOUNTER — Encounter: Payer: Self-pay | Admitting: Gastroenterology

## 2023-01-28 VITALS — BP 110/80 | HR 74 | Ht 69.0 in | Wt 228.0 lb

## 2023-01-28 DIAGNOSIS — G589 Mononeuropathy, unspecified: Secondary | ICD-10-CM | POA: Diagnosis not present

## 2023-01-28 DIAGNOSIS — K50813 Crohn's disease of both small and large intestine with fistula: Secondary | ICD-10-CM

## 2023-01-28 NOTE — Progress Notes (Signed)
Chief Complaint:    Abdominal pain, abdominal wall injection   HPI:    Patient is a 47 y.o. male with a ongstanding history of Crohn's Disease s/p ileocecectomy, and follows regularly with Dr. Leone Payor, initially seen by me on 11/10/2021 for abdominal wall syndrome.  Exam that day with +Carnett's sign in the RUQ, treated with lidocaine 1% and 40 mg of Kenalog for anterior cutaneous nerve entrapment syndrome.  He had an excellent response with resolution of his abdominal pain.  Unfortunately started to have recurrence of the same index pain in the last couple of months.  Same location, quality.   His Crohn's is largely in clinical remission.  Had a colonoscopy in 04/2022 with Dr. Leone Payor which did show mild ileitis with inflammation at his ileocolonic anastomosis and posterior anal fissure .  Adalimumab trough at goal and no antiadalimumab antibody last month.  He continues to follow closely with Dr. Leone Payor for his Crohn's.  Review of systems:     No chest pain, no SOB, no fevers, no urinary sx   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   COVID-19 06/2021   Crohn's disease (HCC) followed by dr Leone Payor   small and large intestines--  hx bowel resection 02/ 2009--  currently treated w/ humira   Essential hypertension, benign    History of CMV 10/14/2010   resolved w/ antiviral therapy   History of kidney stones    History of resection of small bowel    Hyperlipidemia    Major depressive disorder    OSA (obstructive sleep apnea)    01-05-2018 per pt resolved since tonsils removed    Ventral hernia    Vitamin D deficiency     Patient's surgical history, family medical history, social history, medications and allergies were all reviewed in Epic    Current Outpatient Medications  Medication Sig Dispense Refill   acetaminophen (TYLENOL) 325 MG tablet Take 650 mg by  mouth every 6 (six) hours as needed for moderate pain.     albuterol (VENTOLIN HFA) 108 (90 Base) MCG/ACT inhaler TAKE 2 PUFFS BY MOUTH EVERY 6 HOURS AS NEEDED FOR WHEEZE OR SHORTNESS OF BREATH 8.5 each 5   ALPRAZolam (XANAX XR) 3 MG 24 hr tablet TAKE 1 TABLET BY MOUTH EVERY DAY IN THE MORNING 30 tablet 2   AMBULATORY NON FORMULARY MEDICATION Dilitazem 2% mixed with Lidocaine 5% Sig: apply a pea size amount to rectum 2-3 times a day 30 g 3   cyanocobalamin (VITAMIN B12) 1000 MCG tablet Take 1 tablet (1,000 mcg total) by mouth daily.     diphenoxylate-atropine (LOMOTIL) 2.5-0.025 MG tablet TAKE 1 TABLET BY MOUTH 4 TIMES A DAY AS NEEDED FOR DIARRHEA/LOOSE STOOLS 90 tablet 1   emtricitabine-tenofovir (TRUVADA) 200-300 MG tablet TAKE 1 TABLET BY MOUTH EVERY DAY 90 tablet 0   gabapentin (NEURONTIN) 600 MG tablet Take 600 mg by mouth at bedtime.     HUMIRA, 2 PEN, 40 MG/0.4ML PNKT INJECT 40 MG UNDER THE SKIN ONCE A WEEK 12 each 3   ketoconazole (NIZORAL) 2 % cream Apply 1 Application topically 2 (two) times daily. 30 g 0   L-Methylfolate-Algae (DEPLIN 15 PO) Take 1 capsule by mouth daily. A medical food supplement     lisinopril-hydrochlorothiazide (ZESTORETIC) 20-25 MG tablet TAKE 1 TABLET BY MOUTH EVERY DAY 90 tablet 1   montelukast (SINGULAIR)  10 MG tablet TAKE 1 TABLET BY MOUTH EVERYDAY AT BEDTIME 90 tablet 3   ondansetron (ZOFRAN) 4 MG tablet TAKE 1 TAB BY MOUTH EVERY 6 HOURS (Patient taking differently: Take 4 mg by mouth every 6 (six) hours as needed for nausea or vomiting.) 30 tablet 1   QVAR REDIHALER 80 MCG/ACT inhaler INHALE 2 PUFFS INTO THE LUNGS TWICE A DAY 31.8 g 1   rosuvastatin (CRESTOR) 40 MG tablet Take 1 tablet (40 mg total) by mouth daily. 90 tablet 3   TRINTELLIX 20 MG TABS tablet Take 20 mg by mouth daily.     zolpidem (AMBIEN) 10 MG tablet TAKE 1 TABLET BY MOUTH EVERY DAY AT BEDTIME AS NEEDED FOR SLEEP 30 tablet 5   HYDROcodone-acetaminophen (NORCO) 5-325 MG tablet Take 1 tablet  by mouth every 6 (six) hours as needed for up to 15 doses for moderate pain. (Patient not taking: Reported on 01/28/2023) 15 tablet 0   No current facility-administered medications for this visit.    Physical Exam:     BP 110/80   Pulse 74   Ht 5\' 9"  (1.753 m)   Wt 228 lb (103.4 kg)   BMI 33.67 kg/m   GENERAL:  Pleasant male in NAD PSYCH: : Cooperative, normal affect ABDOMEN:  +Carnett's sign with pinpoint TTP in the RUQ. Otherwise, nondistended, soft.  No peritoneal signs.  No obvious masses, no hepatomegaly,  normal bowel sounds  SKIN:  turgor, no lesions seen Musculoskeletal:  Normal muscle tone, normal strength NEURO: Alert and oriented x 3, no focal neurologic deficits   IMPRESSION and PLAN:    1)  Anterior Cutaneous Nerve Entrapment Syndrome/Abdominal Wall Syndrome  PROCEDURE NOTE: The patient presents with symptomatic Abdominal Wall Syndrome, unresponsive to conservative management, requesting abdominal wall injection for diagnostic and therapeutic intent.  All risks, benefits and alternative forms of therapy were described and informed consent was obtained.  -The patient was draped and the site prepped in the usual sterile fashion.  Site of pain was reconfirmed by manual palpation. -Lidocaine 1%.  Injected 5 cc into the site of pain, with resolution of pain upon repeat palpation.  This confirmed the diagnosis of Abdominal Wall Syndrome. -Kenalog 40 mg.  Injected directly into the site of pain for therapeutic intervention of the above confirmed Abdominal Wall Syndrome. -Band-Aid applied -The patient was observed in the Gastroenterology Clinic for 15 minutes after the procedure. No complications were encountered and the patient tolerated the procedure well.  -I again explained that pain may recur and approximately 20% of patients.  If the pain does recur, can follow-up with me for possible repeat injection in 4+ weeks.  Otherwise, to follow-up with me prn.   2) Crohns  Disease - On Humira weekly - To f/u with Dr. Leone Payor as scheduled    Shellia Cleverly ,DO, [facility] 01/28/2023, 10:13 AM

## 2023-01-28 NOTE — Patient Instructions (Signed)
Follow up as needed.   _______________________________________________________  If your blood pressure at your visit was 140/90 or greater, please contact your primary care physician to follow up on this.  _______________________________________________________  If you are age 48 or younger, your body mass index should be between 19-25. Your Body mass index is 33.67 kg/m. If this is out of the aformentioned range listed, please consider follow up with your Primary Care Provider.   __________________________________________________________  The [place] providers would like to encourage you to use [facility] to communicate with providers for non-urgent requests or questions.  Due to long hold times on the telephone, sending your provider a message by [facility] may be a faster and more efficient way to get a response.  Please allow 48 business hours for a response.  Please remember that this is for non-urgent requests.   Due to recent changes in healthcare laws, you may see the results of your imaging and laboratory studies on MyChart before your provider has had a chance to review them.  We understand that in some cases there may be results that are confusing or concerning to you. Not all laboratory results come back in the same time frame and the provider may be waiting for multiple results in order to interpret others.  Please give [place] 48 hours in order for your provider to thoroughly review all the results before contacting the office for clarification of your results.     Thank you for choosing me and  Gastroenterology.  Vito Cirigliano, D.O.

## 2023-01-30 ENCOUNTER — Other Ambulatory Visit: Payer: Self-pay | Admitting: Physician Assistant

## 2023-01-30 DIAGNOSIS — Z79899 Other long term (current) drug therapy: Secondary | ICD-10-CM

## 2023-02-04 ENCOUNTER — Ambulatory Visit: Payer: BC Managed Care – PPO | Admitting: Gastroenterology

## 2023-02-24 ENCOUNTER — Telehealth: Payer: Self-pay | Admitting: Pharmacy Technician

## 2023-02-24 ENCOUNTER — Other Ambulatory Visit (HOSPITAL_COMMUNITY): Payer: Self-pay

## 2023-02-24 NOTE — Telephone Encounter (Signed)
Pharmacy Patient Advocate Encounter   Received notification from CoverMyMeds that prior authorization for HUMIRA 40MG  is required/requested.   Insurance verification completed.   The patient is insured through [employer] .   Per test claim: PA required; PA submitted to [employer] via CoverMyMeds Key/confirmation #/[id number] [facility] Status is pending

## 2023-03-04 ENCOUNTER — Other Ambulatory Visit: Payer: Self-pay | Admitting: Physician Assistant

## 2023-03-04 ENCOUNTER — Other Ambulatory Visit: Payer: Self-pay | Admitting: Internal Medicine

## 2023-03-04 NOTE — Telephone Encounter (Signed)
[facility]. [name] [name] is up to date on his office visits.

## 2023-03-09 ENCOUNTER — Encounter: Payer: Self-pay | Admitting: Internal Medicine

## 2023-03-09 NOTE — Telephone Encounter (Signed)
Pt contacted in regard to my chart message that was sent. Pt stated that he is having extreme nausea at times; 3-5 times a week. Pt stated that it is not every day, last week it was Monday, Tuesday, Wednesday and Friday. And this week Tuesday ( today) Taking zofran.  Please review and advise

## 2023-04-27 ENCOUNTER — Other Ambulatory Visit: Payer: Self-pay | Admitting: Internal Medicine

## 2023-04-27 NOTE — Telephone Encounter (Signed)
Please advise Sir, thank you. 

## 2023-04-30 ENCOUNTER — Encounter: Payer: Self-pay | Admitting: Internal Medicine

## 2023-05-02 ENCOUNTER — Other Ambulatory Visit: Payer: Self-pay | Admitting: Physician Assistant

## 2023-05-02 DIAGNOSIS — Z79899 Other long term (current) drug therapy: Secondary | ICD-10-CM

## 2023-05-04 NOTE — Telephone Encounter (Signed)
[name] [name], please call pt and schedule f/u Diabetes this week or next per [facility]. Tell pt can not fill Truvada till he is seen. Thanks

## 2023-05-05 ENCOUNTER — Other Ambulatory Visit: Payer: Self-pay | Admitting: *Deleted

## 2023-05-05 MED ORDER — ROSUVASTATIN CALCIUM 40 MG PO TABS: 40.0000 mg | ORAL_TABLET | Freq: Every day | ORAL | 1 refills | Status: DC

## 2023-05-12 ENCOUNTER — Encounter: Payer: Self-pay | Admitting: Internal Medicine

## 2023-05-12 ENCOUNTER — Other Ambulatory Visit: Payer: BC Managed Care – PPO

## 2023-05-12 ENCOUNTER — Ambulatory Visit (INDEPENDENT_AMBULATORY_CARE_PROVIDER_SITE_OTHER): Payer: BC Managed Care – PPO | Admitting: Internal Medicine

## 2023-05-12 VITALS — BP 110/70 | HR 81 | Ht 69.0 in | Wt 230.0 lb

## 2023-05-12 DIAGNOSIS — K603 Anal fistula, unspecified: Secondary | ICD-10-CM | POA: Diagnosis not present

## 2023-05-12 DIAGNOSIS — L290 Pruritus ani: Secondary | ICD-10-CM | POA: Diagnosis not present

## 2023-05-12 DIAGNOSIS — L309 Dermatitis, unspecified: Secondary | ICD-10-CM

## 2023-05-12 DIAGNOSIS — K50813 Crohn's disease of both small and large intestine with fistula: Secondary | ICD-10-CM

## 2023-05-12 DIAGNOSIS — Z7960 Long term (current) use of unspecified immunomodulators and immunosuppressants: Secondary | ICD-10-CM

## 2023-05-12 DIAGNOSIS — K602 Anal fissure, unspecified: Secondary | ICD-10-CM

## 2023-05-12 DIAGNOSIS — Z111 Encounter for screening for respiratory tuberculosis: Secondary | ICD-10-CM

## 2023-05-12 DIAGNOSIS — R110 Nausea: Secondary | ICD-10-CM

## 2023-05-12 MED ORDER — NYSTATIN-TRIAMCINOLONE 100000-0.1 UNIT/GM-% EX OINT: 1.0000 | TOPICAL_OINTMENT | Freq: Two times a day (BID) | CUTANEOUS | 1 refills | Status: AC

## 2023-05-12 NOTE — Progress Notes (Signed)
[name] [name] 47 y.o. 10-Nov-1975 [id number]  Assessment & Plan:   Encounter Diagnoses  Name Primary?   Crohn's disease of both small and large intestine with fistula (HCC) Yes   Anal fissure and fistula    Perianal dermatitis    Long-term use of immunosuppressant medication    Chronic nausea    Tuberculosis screening    Plan is to continue with weekly Humira he is doing well on that.  In July he had a good trough level and no antibodies.  Would prefer not to change.  Treat the perianal dermatitis issues as below with Mycolog ointment and then also use Zeasorb AF at night, and Desitin once treatment course with the Mycolog generic is done.  If he has persistent issues he should consult with his dermatologist, I think.  Meds ordered this encounter  Medications   nystatin-triamcinolone ointment (MYCOLOG)    Sig: Apply 1 Application topically 2 (two) times daily.    Dispense:  30 g    Refill:  1  \   Orders Placed This Encounter  Procedures   QuantiFERON-TB Gold Plus    Follow-up 6 months sooner if needed.   Subjective:  Gastroenterology summary:  Crohn's disease of the small and large intestine with history of ilio cecal resection, and perianal fistula Diagnosed 2005, has been on azathioprine and Remicade on and off On Remicade monotherapy now since 2009 right hemicolectomy (perforation) and recuuernt perianal fistula (controlled) Azathioprine restarted 11/2009. Due to data showing better outcomes on dual tx (biologic and immunomodulator).  Azathioprine discontinued May 2011 do to CMV infection. 05/2013 - has antibodies to infliximab 06/30/2013 initiating Humira November 2018 colonoscopy with inflammatory changes at the anastomosis, small bowel and colon were normal 07/2017 weekly Humira started Late 2021 flare treated with budesonide (CT enterography abnormal TI) Colonoscopy November 2023-patchy terminal ileitis, anal fissure, anastomotic ulcer, and normal colon  biopsies Adalimumab level 12 and no antibodies July 2024  Chronic posterior anal fissure status post Botox injection  Bile salt induced diarrhea after ileocolonic resection  B12 deficiency  Musculoskeletal right upper quadrant pain successfully treated with steroid injection for peripheral nerve entrapment syndrome  Perianal dermatitis  Normal EGD 2014 no evidence of Crohn's disease of the upper GI tract  Chronic nausea cause unclear  ---------------------------------------------------------------------------------------------------------------------------------  Chief Complaint: Perianal pain and rash  HPI 47 year old white man with the above gastroenterology problems, presents for follow-up today.  [name] [name] reports he is having perianal pain and discomfort.  He sweats a lot when he sleeps, and he thinks the dermatitis problems are back.  He had successful relief of intertrigo using Zeasorb AF in the abdominal area.  He is using Nizoral cream again but sometimes that irritates things any added in some topical hydrocortisone, on the perianal area.  His bowel movements are normal and formed and no bleeding.  His right sided abdominal pain resolved after Dr. Barron Alvine performed injection of steroids for nerve entrapment syndrome.  He reports that nausea is still an issue that is relieved by 8 mg of ondansetron in the morning.  I had previously told him I thought maybe it was from anxiety.  His psychiatrist is not really sure but is treating it with ondansetron.  He is tolerating life this way.  He received a message that he should consider bio similar medications instead of his Humira.  It does not sound like, from what we know that he is bound to make that change though he may need a prior authorization  when things turn over in January.  He does have a recent prior authorization to continue Humira and he says it is good through October 2025.  His fissure bothers him sometimes but it is not a  significant issue these days.  He has not noted any rectal bleeding or perianal or anal discharge consistent with prior fistula and abscess problems.  There is no anal receptive intercourse.   No Known Allergies Current Meds  Medication Sig   acetaminophen (TYLENOL) 325 MG tablet Take 650 mg by mouth every 6 (six) hours as needed for moderate pain.   albuterol (VENTOLIN HFA) 108 (90 Base) MCG/ACT inhaler TAKE 2 PUFFS BY MOUTH EVERY 6 HOURS AS NEEDED FOR WHEEZE OR SHORTNESS OF BREATH   ALPRAZolam (XANAX XR) 3 MG 24 hr tablet TAKE 1 TABLET BY MOUTH EVERY DAY IN THE MORNING   AMBULATORY NON FORMULARY MEDICATION Dilitazem 2% mixed with Lidocaine 5% Sig: apply a pea size amount to rectum 2-3 times a day   cyanocobalamin (VITAMIN B12) 1000 MCG tablet Take 1 tablet (1,000 mcg total) by mouth daily.   diphenoxylate-atropine (LOMOTIL) 2.5-0.025 MG tablet TAKE 1 TABLET BY MOUTH 4 TIMES A DAY AS NEEDED FOR DIARRHEA/LOOSE STOOLS   emtricitabine-tenofovir (TRUVADA) 200-300 MG tablet TAKE 1 TABLET BY MOUTH EVERY DAY   gabapentin (NEURONTIN) 600 MG tablet Take 600 mg by mouth at bedtime.   HUMIRA, 2 PEN, 40 MG/0.4ML PNKT INJECT 40 MG UNDER THE SKIN ONCE A WEEK   HYDROcodone-acetaminophen (NORCO) 5-325 MG tablet Take 1 tablet by mouth every 6 (six) hours as needed for up to 15 doses for moderate pain.   ketoconazole (NIZORAL) 2 % cream Apply 1 Application topically 2 (two) times daily.   L-Methylfolate-Algae (DEPLIN 15 PO) Take 1 capsule by mouth daily. A medical food supplement   lisinopril-hydrochlorothiazide (ZESTORETIC) 20-25 MG tablet TAKE 1 TABLET BY MOUTH EVERY DAY   montelukast (SINGULAIR) 10 MG tablet TAKE 1 TABLET BY MOUTH EVERYDAY AT BEDTIME   ondansetron (ZOFRAN) 4 MG tablet TAKE 1 TAB BY MOUTH EVERY 6 HOURS (Patient taking differently: Take 4 mg by mouth every 6 (six) hours as needed for nausea or vomiting. Pt taking one 8 mg  as needed)   QVAR REDIHALER 80 MCG/ACT inhaler INHALE 2 PUFFS INTO THE  LUNGS TWICE A DAY   rosuvastatin (CRESTOR) 40 MG tablet Take 1 tablet (40 mg total) by mouth daily.   TRINTELLIX 20 MG TABS tablet Take 20 mg by mouth daily.   zolpidem (AMBIEN) 10 MG tablet TAKE 1 TABLET BY MOUTH EVERY DAY AT BEDTIME AS NEEDED FOR SLEEP   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   COVID-19 06/2021   Crohn's disease (HCC) followed by dr Leone Payor   small and large intestines--  hx bowel resection 02/ 2009--  currently treated w/ humira   Essential hypertension, benign    History of CMV 10/14/2010   resolved w/ antiviral therapy   History of kidney stones    History of resection of small bowel    Hyperlipidemia    Major depressive disorder    OSA (obstructive sleep apnea)    01-05-2018 per pt resolved since tonsils removed    Ventral hernia    Vitamin D deficiency    Past Surgical History:  Procedure Laterality Date   ANAL FISTULOTOMY N/A 01/07/2018   Procedure: ANAL FISTULOTOMY;  Surgeon: Romie Levee, MD;  Location: [facility];  Service: General;  Laterality: N/A;   COLONOSCOPY  last one 04-2017   dr Leone Payor   EXPLORATORY LAPAROTOMY W/ ILEOSECECTOMY W/ PRIMARY ANASTAMOSIS  08-05-2007    dr gross  [facility]   crohn's, sbo, perferation's   GANGLION CYST EXCISION Right 2016   wrist   HYPOSPADIAS CORRECTION  CHILD   SPHINCTEROTOMY N/A 01/07/2018   Procedure: CHEMICAL SPHINCTEROTOMY (BOTOX);  Surgeon: Romie Levee, MD;  Location: [facility];  Service: General;  Laterality: N/A;   SPHINCTEROTOMY N/A 07/02/2022   Procedure: chemical SPHINCTEROTOMY;  Surgeon: Romie Levee, MD;  Location[facility];  Service: General;  Laterality: N/A;   TONSILLECTOMY Bilateral 09/30/2015   Procedure: BILATERAL TONSILLECTOMY;  Surgeon: Serena Colonel, MD;  Location: [facility] OR;  Service: ENT;  Laterality: Bilateral;   UPPER  GASTROINTESTINAL ENDOSCOPY  10/15/2010   w/biopsy, mild gastritis and duodenitis   Social History   Social History Narrative   HSG, [address] - 2 years. Married '99 - 66yrs/divorced. 2 sons - '98, '00 split custody. Work - Artist. Lives with partner No exercise. No history of physical or sexual abuse.    [employer]   family history includes Allergies in his mother; Gout in his father; Heart disease in his father; Hyperlipidemia in his father; Hypertension in his father; Multiple sclerosis in his mother; Myasthenia gravis in his maternal grandfather; Obesity in his mother; Pancreatic cancer (age of onset: 91) in his paternal grandfather; Sleep apnea in his father; Stroke in his maternal grandfather; Stroke (age of onset: 71) in his paternal grandmother.   Review of Systems As per HPI  Objective:   Physical Exam BP 110/70   Pulse 81   Ht 5\' 9"  (1.753 m)   Wt 230 lb (104.3 kg)   BMI 33.97 kg/m  WDWN obese wm NAD Lungs cta Cor NL S!s2 no rmg Abd obese soft and mildly tender  Rectal      Upper photo shows some perianal erythema, not seen is a tiny pinpoint left lateral posterior fistula without discharge or fluctuance or any other problems noted.  The lower photo shows slight skin breakdown and erythema over the coccyx, which is slightly tender.  There are some satellite lesions suggesting yeast.  DRE mod tender posterior - scarred post fissure

## 2023-05-12 NOTE — Patient Instructions (Signed)
Your provider has requested that you go to the basement level for lab work before leaving today. Press "B" on the elevator. The lab is located at the first door on the left as you exit the elevator.  We have sent the following medications to your pharmacy for you to pick up at your convenience: Mycolog oint.  Also use your anti-fungal cream in the anal area. If after using your Mycolog and anti-fungal treatment still having issues see your dermatologist.  Continue the Humira.  I appreciate the opportunity to care for you. Stan Head, MD, [facility]

## 2023-05-13 NOTE — Progress Notes (Signed)
[name] [name] is a 47 y.o. male here for medication refills.  History of Present Illness:   Chief Complaint  Patient presents with   On pre-exposure prophylaxis for HIV    Pt needs refill for medication Truvada    HPI  Anxiety and Depression Treated with 20 mg Trintellix, alprazolam 3 mg daily, Deplin 15 PO. He is having anxiety while trying to taper off of alprazolam. Followed by Dr. Evelene Croon. Denies SI/HI.  Essential Hypertension Treated with lisinopril-hydrochlorothiazide 20-25 mg daily. Requesting refill. Blood pressure is well-controlled.  Dry Skin / Skin Breakdown Using nystatin-triamcinolone ointment, Zeasorb AF powder. Reports Zeasorb creates a discomforting skin feel and would like to try ketoconazole.  Nausea/Vomiting Taking Zofran 4 mg daily and reports he has nausea/vomiting without it. Nausea/vomiting has been ongoing since 02/2023. Wonders if this is related to Trintellix. Takes Tylenol for headaches associated with Zofran. Normal EGD 2014 no evidence of Crohn's disease of the upper GI tract. Colonoscopy November 2023-patchy terminal ileitis, anal fissure, anastomotic ulcer, and normal colon biopsies. Has some right abdominal pain.  HIV Prevention  Taking Truvada 200-300 mg daily. Requesting refill.  Vaccine counseling- reports he is UTD on flu vaccine.  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   COVID-19 06/2021   Crohn's disease (HCC) followed by dr Leone Payor   small and large intestines--  hx bowel resection 02/ 2009--  currently treated w/ humira   Essential hypertension, benign    History of CMV 10/14/2010   resolved w/ antiviral therapy   History of kidney stones    History of resection of small bowel    Hyperlipidemia    Major depressive disorder    OSA (obstructive sleep apnea)    01-05-2018 per pt resolved since tonsils removed     Ventral hernia    Vitamin D deficiency      Social History   Tobacco Use   Smoking status: Never   Smokeless tobacco: Never  Vaping Use   Vaping status: Never Used  Substance Use Topics   Alcohol use: Yes    Alcohol/week: 3.0 standard drinks of alcohol    Types: 3 Shots of liquor per week    Comment: rare   Drug use: Yes    Types: Marijuana    Past Surgical History:  Procedure Laterality Date   ANAL FISTULOTOMY N/A 01/07/2018   Procedure: ANAL FISTULOTOMY;  Surgeon: Romie Levee, MD;  Location[facility];  Service: General;  Laterality: N/A;   COLONOSCOPY  last one 04-2017   dr Leone Payor   EXPLORATORY LAPAROTOMY W/ ILEOSECECTOMY W/ PRIMARY ANASTAMOSIS  08-05-2007    dr gross  [facility]   crohn's, sbo, perferation's   GANGLION CYST EXCISION Right 2016   wrist   HYPOSPADIAS CORRECTION  CHILD   SPHINCTEROTOMY N/A 01/07/2018   Procedure: CHEMICAL SPHINCTEROTOMY (BOTOX);  Surgeon: Romie Levee, MD;  Location: [facility];  Service: General;  Laterality: N/A;   SPHINCTEROTOMY N/A 07/02/2022   Procedure: chemical SPHINCTEROTOMY;  Surgeon: Romie Levee, MD;  Location: [facility];  Service: General;  Laterality: N/A;   TONSILLECTOMY Bilateral 09/30/2015   Procedure: BILATERAL TONSILLECTOMY;  Surgeon: Serena Colonel, MD;  Location: [facility] OR;  Service: ENT;  Laterality: Bilateral;   UPPER GASTROINTESTINAL ENDOSCOPY  10/15/2010   w/biopsy, mild gastritis and duodenitis    Family History  Problem Relation Age  of Onset   Obesity Mother    Allergies Mother    Multiple sclerosis Mother    Heart disease Father        CAD/MI   Hyperlipidemia Father    Hypertension Father    Gout Father    Sleep apnea Father    Myasthenia gravis Maternal Grandfather    Stroke Maternal Grandfather    Stroke Paternal Grandmother 17   Pancreatic cancer Paternal Grandfather 72   Colon cancer Neg Hx    Diabetes Neg Hx    COPD Neg Hx    Colon polyps Neg Hx     Stomach cancer Neg Hx     No Known Allergies  Current Medications:   Current Outpatient Medications:    acetaminophen (TYLENOL) 325 MG tablet, Take 650 mg by mouth every 6 (six) hours as needed for moderate pain., Disp: , Rfl:    albuterol (VENTOLIN HFA) 108 (90 Base) MCG/ACT inhaler, TAKE 2 PUFFS BY MOUTH EVERY 6 HOURS AS NEEDED FOR WHEEZE OR SHORTNESS OF BREATH, Disp: 8.5 each, Rfl: 5   ALPRAZolam (XANAX XR) 3 MG 24 hr tablet, TAKE 1 TABLET BY MOUTH EVERY DAY IN THE MORNING, Disp: 30 tablet, Rfl: 2   cyanocobalamin (VITAMIN B12) 1000 MCG tablet, Take 1 tablet (1,000 mcg total) by mouth daily., Disp: , Rfl:    diphenoxylate-atropine (LOMOTIL) 2.5-0.025 MG tablet, TAKE 1 TABLET BY MOUTH 4 TIMES A DAY AS NEEDED FOR DIARRHEA/LOOSE STOOLS, Disp: 90 tablet, Rfl: 1   gabapentin (NEURONTIN) 600 MG tablet, Take 600 mg by mouth at bedtime., Disp: , Rfl:    HUMIRA, 2 PEN, 40 MG/0.4ML PNKT, INJECT 40 MG UNDER THE SKIN ONCE A WEEK, Disp: 12 each, Rfl: 3   L-Methylfolate-Algae (DEPLIN 15 PO), Take 1 capsule by mouth daily. A medical food supplement, Disp: , Rfl:    montelukast (SINGULAIR) 10 MG tablet, TAKE 1 TABLET BY MOUTH EVERYDAY AT BEDTIME, Disp: 90 tablet, Rfl: 3   nystatin-triamcinolone ointment (MYCOLOG), Apply 1 Application topically 2 (two) times daily., Disp: 30 g, Rfl: 1   ondansetron (ZOFRAN) 4 MG tablet, TAKE 1 TAB BY MOUTH EVERY 6 HOURS (Patient taking differently: Take 4 mg by mouth every 6 (six) hours as needed for nausea or vomiting. Pt taking one 8 mg  as needed), Disp: 30 tablet, Rfl: 1   QVAR REDIHALER 80 MCG/ACT inhaler, INHALE 2 PUFFS INTO THE LUNGS TWICE A DAY, Disp: 31.8 g, Rfl: 1   rosuvastatin (CRESTOR) 40 MG tablet, Take 1 tablet (40 mg total) by mouth daily., Disp: 90 tablet, Rfl: 1   TRINTELLIX 20 MG TABS tablet, Take 20 mg by mouth daily., Disp: , Rfl:    zolpidem (AMBIEN) 10 MG tablet, TAKE 1 TABLET BY MOUTH EVERY DAY AT BEDTIME AS NEEDED FOR SLEEP, Disp: 30 tablet, Rfl: 5    emtricitabine-tenofovir (TRUVADA) 200-300 MG tablet, Take 1 tablet by mouth daily., Disp: 90 tablet, Rfl: 0   ketoconazole (NIZORAL) 2 % cream, Apply 1 Application topically 2 (two) times daily., Disp: 60 g, Rfl: 1   lisinopril-hydrochlorothiazide (ZESTORETIC) 20-25 MG tablet, Take 1 tablet by mouth daily., Disp: 90 tablet, Rfl: 1   Review of Systems:   Review of Systems  Constitutional:  Negative for fever and malaise/fatigue.  HENT:  Negative for congestion.   Eyes:  Negative for blurred vision.  Respiratory:  Negative for cough and shortness of breath.   Cardiovascular:  Negative for chest pain, palpitations and leg swelling.  Gastrointestinal:  Positive for abdominal  pain (Right), nausea and vomiting.  Musculoskeletal:  Negative for back pain.  Skin:  Negative for rash.  Neurological:  Negative for loss of consciousness and headaches.  Psychiatric/Behavioral:  The patient is nervous/anxious.     Vitals:   Vitals:   05/14/23 0827  BP: 122/80  Pulse: 77  Temp: 98.2 F (36.8 C)  TempSrc: Temporal  SpO2: 96%  Weight: 232 lb (105.2 kg)  Height: 5\' 9"  (1.753 m)     Body mass index is 34.26 kg/m.  Physical Exam:   Physical Exam Vitals and nursing note reviewed.  Constitutional:      General: He is not in acute distress.    Appearance: He is well-developed. He is not ill-appearing or toxic-appearing.  Cardiovascular:     Rate and Rhythm: Normal rate and regular rhythm.     Pulses: Normal pulses.     Heart sounds: Normal heart sounds, S1 normal and S2 normal.  Pulmonary:     Effort: Pulmonary effort is normal.     Breath sounds: Normal breath sounds.  Skin:    General: Skin is warm and dry.  Neurological:     Mental Status: He is alert.     GCS: GCS eye subscore is 4. GCS verbal subscore is 5. GCS motor subscore is 6.  Psychiatric:        Speech: Speech normal.        Behavior: Behavior normal. Behavior is cooperative.     Assessment and Plan:   Anxiety with  depression Ongoing Management per psychiatry Follow-up with [place] prn  Essential hypertension, benign Normotensive Continue  lisinopril-hydrochlorothiazide 20-25 mg daily. Follow-up in 6 months, sooner if concerns  Intertrigo Difficulty using powder and ointment Will trial ketoconazole He will follow-up with dermatology if persists  Moderate nausea and vomiting Unclear etiology I did ask him to reach out to his psychiatrist about Trintellix possibly causing this We may need to get gastroenterology to evaluate further Continue to monitor  On pre-exposure prophylaxis for HIV Repeat Truvada blood work today Will refill Future blood work for 3 months from now put in -- he will schedule lab only appointment for this Will see patient in office every 6 months    I,Alexander Ruley,acting as a Neurosurgeon for [employer], PA.,have documented all relevant documentation on the behalf of Jarold Motto, PA,as directed by  Jarold Motto, PA while in the presence of Jarold Motto, [place].  I, Jarold Motto, [place], have reviewed all documentation for this visit. The documentation on 05/14/23 for the exam, diagnosis, procedures, and orders are all accurate and complete.    Jarold Motto, [name]

## 2023-05-14 ENCOUNTER — Other Ambulatory Visit (HOSPITAL_COMMUNITY)
Admission: RE | Admit: 2023-05-14 | Discharge: 2023-05-14 | Disposition: A | Discharge status: Home / Self Care | Payer: BC Managed Care – PPO | Source: Ambulatory Visit | Attending: Physician Assistant | Admitting: Physician Assistant

## 2023-05-14 ENCOUNTER — Ambulatory Visit: Payer: BC Managed Care – PPO | Admitting: Physician Assistant

## 2023-05-14 ENCOUNTER — Encounter: Payer: Self-pay | Admitting: Physician Assistant

## 2023-05-14 VITALS — BP 122/80 | HR 77 | Temp 98.2°F | Ht 69.0 in | Wt 232.0 lb

## 2023-05-14 DIAGNOSIS — L304 Erythema intertrigo: Secondary | ICD-10-CM

## 2023-05-14 DIAGNOSIS — Z79899 Other long term (current) drug therapy: Secondary | ICD-10-CM | POA: Insufficient documentation

## 2023-05-14 DIAGNOSIS — I10 Essential (primary) hypertension: Secondary | ICD-10-CM

## 2023-05-14 DIAGNOSIS — F418 Other specified anxiety disorders: Secondary | ICD-10-CM

## 2023-05-14 DIAGNOSIS — R112 Nausea with vomiting, unspecified: Secondary | ICD-10-CM | POA: Diagnosis not present

## 2023-05-14 MED ORDER — KETOCONAZOLE 2 % EX CREA: 1.0000 | TOPICAL_CREAM | Freq: Two times a day (BID) | CUTANEOUS | 1 refills | Status: AC

## 2023-05-14 MED ORDER — EMTRICITABINE-TENOFOVIR DF 200-300 MG PO TABS: 1.0000 | ORAL_TABLET | Freq: Every day | ORAL | 0 refills | Status: DC

## 2023-05-14 MED ORDER — LISINOPRIL-HYDROCHLOROTHIAZIDE 20-25 MG PO TABS: 1.0000 | ORAL_TABLET | Freq: Every day | ORAL | 1 refills | Status: DC

## 2023-05-14 NOTE — Patient Instructions (Signed)
It was great to see you!  Refill medications  Trial the ketoconazole   Consider Pristiq with your psychiatry for possible alleviation of nausea/vomiting  Repeat blood work in 3 months -- lab only  Follow-up in office in 6 months  Take care,  Jarold Motto [name]

## 2023-05-15 LAB — RPR: RPR Ser Ql: NONREACTIVE

## 2023-05-15 LAB — QUANTIFERON-TB GOLD PLUS

## 2023-05-15 LAB — HIV ANTIBODY (ROUTINE TESTING W REFLEX): HIV 1&2 Ab, 4th Generation: NONREACTIVE

## 2023-05-17 ENCOUNTER — Telehealth: Payer: Self-pay

## 2023-05-17 DIAGNOSIS — Z7960 Long term (current) use of unspecified immunomodulators and immunosuppressants: Secondary | ICD-10-CM

## 2023-05-17 DIAGNOSIS — Z111 Encounter for screening for respiratory tuberculosis: Secondary | ICD-10-CM

## 2023-05-17 DIAGNOSIS — K50813 Crohn's disease of both small and large intestine with fistula: Secondary | ICD-10-CM

## 2023-05-17 LAB — URINE CYTOLOGY ANCILLARY ONLY
Chlamydia: NEGATIVE
Comment: NEGATIVE
Comment: NORMAL
Neisseria Gonorrhea: NEGATIVE

## 2023-05-17 NOTE — Telephone Encounter (Signed)
[name] [name] informed to come back and have his blood re drawn for the TB testing. The lab called and said they didn't have enough plasma to run the test. Orders placed again.

## 2023-05-18 ENCOUNTER — Other Ambulatory Visit: Payer: Self-pay | Admitting: Physician Assistant

## 2023-05-31 ENCOUNTER — Other Ambulatory Visit: Payer: Self-pay | Admitting: Internal Medicine

## 2023-06-08 ENCOUNTER — Encounter: Payer: Self-pay | Admitting: Internal Medicine

## 2023-06-08 NOTE — Telephone Encounter (Signed)
Hot palpable lesion adjacent to coccyx it sounds like, no dc No fever Painful to touch and sit  Advised ED visit to determine if it needs I and D vs other (Abx) and ? Imaging  He wants to soak in hot tub and see if that will help and if not will go to ED

## 2023-06-09 ENCOUNTER — Other Ambulatory Visit: Payer: Self-pay | Admitting: Internal Medicine

## 2023-06-09 MED ORDER — DOXYCYCLINE HYCLATE 100 MG PO TABS
100.0000 mg | ORAL_TABLET | Freq: Two times a day (BID) | ORAL | 0 refills | Status: AC
Start: 2023-06-09 — End: 2023-06-16

## 2023-06-10 ENCOUNTER — Encounter: Payer: Self-pay | Admitting: Internal Medicine

## 2023-06-11 ENCOUNTER — Ambulatory Visit: Payer: BC Managed Care – PPO | Admitting: Physician Assistant

## 2023-06-23 ENCOUNTER — Encounter: Payer: Self-pay | Admitting: Gastroenterology

## 2023-06-24 ENCOUNTER — Telehealth: Payer: Self-pay

## 2023-06-24 ENCOUNTER — Other Ambulatory Visit: Payer: Self-pay | Admitting: Physician Assistant

## 2023-06-24 NOTE — Telephone Encounter (Signed)
 Received fax from [facility] that patient's Humira requires a prior authorization. Forms have been faxed to prior auth department.

## 2023-06-24 NOTE — Telephone Encounter (Signed)
 Left message on voicemail to call office. Pharmacy sent a message Qvar is on backorder, not available. Please ask pt to contact insurance to see what would be an alternative inhaler.

## 2023-06-25 ENCOUNTER — Other Ambulatory Visit: Payer: Self-pay | Admitting: Internal Medicine

## 2023-06-25 MED ORDER — HYDROCODONE-ACETAMINOPHEN 5-325 MG PO TABS: 1.0000 | ORAL_TABLET | Freq: Four times a day (QID) | ORAL | 0 refills | Status: DC | PRN

## 2023-06-25 NOTE — Telephone Encounter (Signed)
 Received fax from [facility] stating patient's prescription for Humira 40 mg/0.4 mL pen has been converted to the interchangeable biosimilar, ADALIMUMAB-RYVK (CF) A 40 mg. Fax placed on Dr. Marvell Fuller desk for review.

## 2023-06-25 NOTE — Telephone Encounter (Signed)
 I am going to have him go with the biosimilar for now  Will see if they respond to the appeal they told us to send as of the new year

## 2023-06-28 ENCOUNTER — Other Ambulatory Visit: Payer: Self-pay | Admitting: Physician Assistant

## 2023-06-28 MED ORDER — QVAR REDIHALER 80 MCG/ACT IN AERB: 2.0000 | INHALATION_SPRAY | Freq: Two times a day (BID) | RESPIRATORY_TRACT | 0 refills | Status: DC

## 2023-06-28 NOTE — Telephone Encounter (Signed)
 LVM for patient to return call.

## 2023-06-28 NOTE — Telephone Encounter (Signed)
 Patient returned call and said that he had received a letter from his insurance company. He said that they are wanting him to get his prescriptions filled by [facility] or [facility]. Patient would like for Rx of Qvar  to be sent to [facility].

## 2023-06-29 ENCOUNTER — Other Ambulatory Visit: Payer: Self-pay | Admitting: *Deleted

## 2023-06-29 MED ORDER — QVAR REDIHALER 80 MCG/ACT IN AERB: 2.0000 | INHALATION_SPRAY | Freq: Two times a day (BID) | RESPIRATORY_TRACT | 1 refills | Status: DC

## 2023-06-29 NOTE — Telephone Encounter (Signed)
 Rx sent as requested.

## 2023-06-29 NOTE — Telephone Encounter (Signed)
 Rx for Qvar 90 day supply sent to [facility].

## 2023-06-29 NOTE — Telephone Encounter (Signed)
 Copied from CRM [id number]. Topic: General - Other >> Jun 29, 2023  3:31 PM Pinkey ORN wrote: Reason for CRM: Medication beclomethasone (QVAR  REDIHALER) 80 MCG/ACT inhaler >> Jun 29, 2023  3:33 PM Pinkey ORN wrote: Patient states [facility] won't fill his prescription because it has to be for a 90 day supply, not just 30 day. Please resend that prescription over but for 90 day supply instead.

## 2023-07-01 ENCOUNTER — Encounter: Payer: Self-pay | Admitting: Physician Assistant

## 2023-07-01 ENCOUNTER — Other Ambulatory Visit: Payer: Self-pay | Admitting: Physician Assistant

## 2023-07-01 MED ORDER — ARNUITY ELLIPTA 50 MCG/ACT IN AEPB: 1.0000 | INHALATION_SPRAY | Freq: Every day | RESPIRATORY_TRACT | 3 refills | Status: DC

## 2023-07-01 NOTE — Telephone Encounter (Signed)
 Spoke to pt told him I let a message on 1/2 about your Qvar . I received a message from the pharmacy saying it is on backorder and not available and I did not Rx for Qvar  to [facility] as requested. Pt said yes, but [facility] is saying now it needs to be filled at my local pharmacy. Told him okay you will need to contact your insurance to see what other inhaler they will cover due to not available. Pt verbalized understanding and will send My Chart message with information.

## 2023-07-01 NOTE — Telephone Encounter (Signed)
 [name] [name] please see message about alternative inhaler due to Qvar is not available. Please advise what to order.

## 2023-07-01 NOTE — Telephone Encounter (Signed)
 Copied from CRM [id number]. Topic: Clinical - Medication Question >> Jul 01, 2023  8:55 AM [name] [name] wrote: Reason for CRM: Patient is calling because he [facility] [address], [place], [place] [address] because [facility] won't fill his beclomethasone (QVAR  REDIHALER) 80 MCG/ACT inhaler. He would [facility]

## 2023-07-05 NOTE — Telephone Encounter (Signed)
 Copied from CRM [id number]. Topic: General - Other >> Jul 05, 2023  1:32 PM Whitney O wrote: Reason for CRM: Dr. Job called me in a new inhaler medication and it was 50 mcg and the old inhaler  medication was 80 mcg and patient is wanting to make sure that he doesn't need to take more of the new inhaler because the old inhaler was a higher dosage. Patient say you can mychart him with the info or give him a call [phone]. Please reach out to patient concerning this information needed

## 2023-07-07 ENCOUNTER — Ambulatory Visit: Payer: BC Managed Care – PPO | Admitting: Gastroenterology

## 2023-07-08 ENCOUNTER — Other Ambulatory Visit (HOSPITAL_COMMUNITY): Payer: Self-pay

## 2023-07-08 NOTE — Telephone Encounter (Signed)
I see that the appeal that was sent  was denied. Am I doing another prior authorization for one of the listed preferred medications? If so which am I doing the prior auth on. I am not seeing any other requests.

## 2023-07-09 NOTE — Telephone Encounter (Signed)
I think Raffi told me he received a biosimilar - we can go with that but please ask him if that is so and which one  04/30/23 patient message indicates biosimilar but not the name  Just to clarify let's make sure he is on an adalimumab biosimilar and send the PA if needed for that  thanks

## 2023-07-12 NOTE — Telephone Encounter (Signed)
Left message for pt to call back  °

## 2023-07-13 NOTE — Telephone Encounter (Signed)
Spoke with Pt. Pt stated that he is receiving the adalimumab biosimilar. Pt stated that he has already received 2 doses.  Routed as [employer]

## 2023-07-14 NOTE — Telephone Encounter (Signed)
Not from my understanding. Thanks for all your help

## 2023-07-14 NOTE — Telephone Encounter (Signed)
Is there anything further needed for this encounter

## 2023-07-22 ENCOUNTER — Ambulatory Visit: Payer: 59 | Admitting: Gastroenterology

## 2023-07-22 ENCOUNTER — Encounter: Payer: Self-pay | Admitting: Physician Assistant

## 2023-07-22 ENCOUNTER — Encounter: Payer: Self-pay | Admitting: Gastroenterology

## 2023-07-22 ENCOUNTER — Telehealth: Payer: Self-pay

## 2023-07-22 VITALS — BP 124/72 | HR 70 | Ht 68.0 in | Wt 231.0 lb

## 2023-07-22 DIAGNOSIS — R109 Unspecified abdominal pain: Secondary | ICD-10-CM | POA: Diagnosis not present

## 2023-07-22 DIAGNOSIS — R1011 Right upper quadrant pain: Secondary | ICD-10-CM

## 2023-07-22 NOTE — Patient Instructions (Signed)
_______________________________________________________  If your blood pressure at your visit was 140/90 or greater, please contact your primary care physician to follow up on this.  _______________________________________________________  If you are age 49 or older, your body mass index should be between 23-30. Your Body mass index is 35.12 kg/m. If this is out of the aforementioned range listed, please consider follow up with your Primary Care Provider.  If you are age 12 or younger, your body mass index should be between 19-25. Your Body mass index is 35.12 kg/m. If this is out of the aformentioned range listed, please consider follow up with your Primary Care Provider.   ________________________________________________________  The [facility] providers would like to encourage you to use [facility] to communicate with providers for non-urgent requests or questions.  Due to long hold times on the telephone, sending your provider a message by [facility] may be a faster and more efficient way to get a response.  Please allow 48 business hours for a response.  Please remember that this is for non-urgent requests.  _______________________________________________________  Bonita Quin will follow up in our office on an as needed basis.  It was a pleasure to see you today!  Vito Cirigliano, D.O.

## 2023-07-22 NOTE — Progress Notes (Signed)
Chief Complaint:    Abdominal pain, Abdominal Wall Syndrome  HPI:     Patient is a 48 y.o. male with a ongstanding history of Crohn's Disease s/p ileocecectomy, and follows regularly with Dr. Leone Payor, initially seen by me on 11/10/2021 for abdominal wall syndrome.  Exam that day with +Carnett's sign in the RUQ, treated with lidocaine 1% and 40 mg of Kenalog for anterior cutaneous nerve entrapment syndrome.  He had an excellent response with resolution of his abdominal pain.   Recurrence of pinpoint RUQ pain with repeat abdominal wall injection in 01/28/2023, again with excellent response.   Unfortunately, [name] [name] had recurrence of his same index RUQ pain earlier this month, and presents today for repeat abdominal wall injection. Sxs started after right sidebending, similar to previous, and pain has persisted.  Otherwise doing well from a Crohn's standpoint and had follow-up with Dr. Leone Payor on 05/12/2023.  No change in medical or surgical history, medications, allergies, social history since last appointment with me.   Review of systems:     No chest pain, no SOB, no fevers, no urinary sx   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   COVID-19 06/2021   Crohn's disease (HCC) followed by dr Leone Payor   small and large intestines--  hx bowel resection 02/ 2009--  currently treated w/ humira   Essential hypertension, benign    History of CMV 10/14/2010   resolved w/ antiviral therapy   History of kidney stones    History of resection of small bowel    Hyperlipidemia    Major depressive disorder    OSA (obstructive sleep apnea)    01-05-2018 per pt resolved since tonsils removed    Ventral hernia    Vitamin D deficiency     Patient's surgical history, family medical history, social history, medications and allergies were all reviewed in Epic    Current Outpatient  Medications  Medication Sig Dispense Refill   acetaminophen (TYLENOL) 325 MG tablet Take 650 mg by mouth every 6 (six) hours as needed for moderate pain.     Adalimumab-ryvk, 2 Pen, 40 MG/0.4ML AJKT Inject into the skin.     albuterol (VENTOLIN HFA) 108 (90 Base) MCG/ACT inhaler TAKE 2 PUFFS BY MOUTH EVERY 6 HOURS AS NEEDED FOR WHEEZE OR SHORTNESS OF BREATH 8.5 each 5   ALPRAZolam (XANAX XR) 3 MG 24 hr tablet TAKE 1 TABLET BY MOUTH EVERY DAY IN THE MORNING 30 tablet 2   cyanocobalamin (VITAMIN B12) 1000 MCG tablet Take 1 tablet (1,000 mcg total) by mouth daily.     diphenoxylate-atropine (LOMOTIL) 2.5-0.025 MG tablet TAKE 1 TABLET BY MOUTH 4 TIMES A DAY AS NEEDED FOR DIARRHEA/LOOSE STOOLS 90 tablet 1   emtricitabine-tenofovir (TRUVADA) 200-300 MG tablet Take 1 tablet by mouth daily. 90 tablet 0   Fluticasone Furoate (ARNUITY ELLIPTA) 50 MCG/ACT AEPB Inhale 1 puff into the lungs daily. 60 each 3   gabapentin (NEURONTIN) 600 MG tablet Take 600 mg by mouth at bedtime.     HYDROcodone-acetaminophen (NORCO/VICODIN) 5-325 MG tablet Take 1 tablet by mouth every 6 (six) hours as needed for moderate pain (pain score 4-6). 10 tablet 0   ketoconazole (NIZORAL) 2 % cream Apply 1 Application topically 2 (two) times daily. 60 g 1   L-Methylfolate-Algae (DEPLIN 15 PO) Take 1 capsule by mouth daily. A medical food supplement  lisinopril-hydrochlorothiazide (ZESTORETIC) 20-25 MG tablet Take 1 tablet by mouth daily. 90 tablet 1   montelukast (SINGULAIR) 10 MG tablet TAKE 1 TABLET BY MOUTH EVERYDAY AT BEDTIME 90 tablet 3   nystatin-triamcinolone ointment (MYCOLOG) Apply 1 Application topically 2 (two) times daily. 30 g 1   ondansetron (ZOFRAN) 4 MG tablet TAKE 1 TAB BY MOUTH EVERY 6 HOURS (Patient taking differently: Take 4 mg by mouth every 6 (six) hours as needed for nausea or vomiting. Pt taking one 8 mg  as needed) 30 tablet 1   rosuvastatin (CRESTOR) 40 MG tablet Take 1 tablet (40 mg total) by mouth daily. 90  tablet 1   zolpidem (AMBIEN) 10 MG tablet TAKE 1 TABLET BY MOUTH EVERY DAY AT BEDTIME AS NEEDED FOR SLEEP 30 tablet 5   No current facility-administered medications for this visit.    Physical Exam:     BP 124/72   Pulse 70   Ht 5\' 8"  (1.727 m)   Wt 231 lb (104.8 kg)   BMI 35.12 kg/m   GENERAL:  Pleasant male in NAD PSYCH: : Cooperative, normal affect ABDOMEN:  +Carnett's sign with pinpoint TTP in the RUQ. Otherwise, nondistended, soft.  No peritoneal signs.  No obvious masses, no hepatomegaly,  normal bowel sounds SKIN:  turgor, no lesions seen Musculoskeletal:  Normal muscle tone, normal strength NEURO: Alert and oriented x 3, no focal neurologic deficits    IMPRESSION and PLAN:    #1.  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  All risks, benefits and alternative forms of therapy were described and informed consent was obtained.  -The patient was draped and the site prepped in the usual sterile fashion.  Site of pain was reconfirmed by manual palpation. -Lidocaine 1%.  Injected 5 cc into the site of pain, with resolution of pain upon repeat palpation.  This confirmed the diagnosis of Abdominal Wall Syndrome. -Kenalog 40 mg (NDC code: 3077098888; Lot #: [id number]).  Injected directly into the site of pain for therapeutic intervention of the above confirmed Abdominal Wall Syndrome. -Band-Aid applied -The patient was observed in the Gastroenterology Clinic for 15 minutes after the procedure. No complications were encountered and the patient tolerated the procedure well.  -I again explained that pain may recur and approximately 20% of patients.  If the pain does recur, can follow-up with me for possible repeat injection in 4+ weeks.  Otherwise, to follow-up  with me as needed.   #2.  Crohn's Disease - Continue follow-up with Dr. Leone Payor as scheduled      Shellia Cleverly ,DO, [facility] 07/22/2023, 3:28 PM

## 2023-07-22 NOTE — Telephone Encounter (Signed)
[name] [name] came in today to see Dr Barron Alvine and got an abdominal wall injection. He ask if Dr Leone Payor can give him a refill on his pain rx just to have on hand in case needed. Will route to Dr Leone Payor to advise.

## 2023-07-23 ENCOUNTER — Other Ambulatory Visit: Payer: Self-pay | Admitting: Internal Medicine

## 2023-07-23 MED ORDER — ARNUITY ELLIPTA 50 MCG/ACT IN AEPB: 1.0000 | INHALATION_SPRAY | Freq: Every day | RESPIRATORY_TRACT | 3 refills | Status: DC

## 2023-07-23 MED ORDER — HYDROCODONE-ACETAMINOPHEN 5-325 MG PO TABS: 1.0000 | ORAL_TABLET | Freq: Four times a day (QID) | ORAL | 0 refills | Status: DC | PRN

## 2023-07-23 NOTE — Telephone Encounter (Signed)
Rx sent after My Chart message

## 2023-07-23 NOTE — Addendum Note (Signed)
Addended by: Jimmye Norman on: 07/23/2023 12:10 PM   Modules accepted: Orders

## 2023-08-08 ENCOUNTER — Other Ambulatory Visit: Payer: Self-pay | Admitting: Physician Assistant

## 2023-08-08 DIAGNOSIS — Z79899 Other long term (current) drug therapy: Secondary | ICD-10-CM

## 2023-08-09 ENCOUNTER — Encounter: Payer: Self-pay | Admitting: Physician Assistant

## 2023-08-13 ENCOUNTER — Other Ambulatory Visit: Payer: 59

## 2023-09-02 ENCOUNTER — Other Ambulatory Visit

## 2023-09-02 DIAGNOSIS — Z79899 Other long term (current) drug therapy: Secondary | ICD-10-CM

## 2023-09-03 ENCOUNTER — Other Ambulatory Visit: Payer: Self-pay | Admitting: Physician Assistant

## 2023-09-03 DIAGNOSIS — Z79899 Other long term (current) drug therapy: Secondary | ICD-10-CM

## 2023-09-03 LAB — HIV ANTIBODY (ROUTINE TESTING W REFLEX): HIV 1&2 Ab, 4th Generation: NONREACTIVE

## 2023-09-03 LAB — RPR: RPR Ser Ql: NONREACTIVE

## 2023-09-10 NOTE — Telephone Encounter (Signed)
 Pharmacy Patient Advocate Encounter  Received notification from [employer] that Prior Authorization for HUMIRA 40MG  has been APPROVED from 8.5.24 to 9.4.25   PA #/Case ID/Reference #: [id number]

## 2023-09-16 ENCOUNTER — Telehealth: Payer: Self-pay | Admitting: Internal Medicine

## 2023-09-16 ENCOUNTER — Encounter: Payer: Self-pay | Admitting: Internal Medicine

## 2023-09-16 NOTE — Telephone Encounter (Signed)
 Inbound call from patient stating that he had sent a message to Dr. Leone Payor back in December of 2024 about a bump that that appeared on his bottom and Dr. Leone Payor sent in some medication for it. Patient states now he has another place that has appeared and is requesting a call back to discuss next steps. Please advise.

## 2023-09-16 NOTE — Telephone Encounter (Signed)
 PT returned call to speak further about his issue. Please advise.

## 2023-09-16 NOTE — Telephone Encounter (Signed)
 Called the patient back. No answer. Left a message on his voicemail of my call. Who is his dermatologist?

## 2023-09-16 NOTE — Telephone Encounter (Signed)
 Spoke with the patient. See the patient message from today.

## 2023-09-17 NOTE — Telephone Encounter (Signed)
 See the patient message of 09/16/23. Spoke with the patient. He applied heat to the area and the area opened on it's own. It has drained some. Feels better. He has cleaned it an applied a band aid. He has decided if it does not continue to improve he will go to his PCP for medical attention.

## 2023-10-22 ENCOUNTER — Encounter: Payer: Self-pay | Admitting: Physician Assistant

## 2023-10-25 NOTE — Telephone Encounter (Signed)
 Noted.

## 2023-10-26 ENCOUNTER — Other Ambulatory Visit: Payer: Self-pay | Admitting: Physician Assistant

## 2023-11-04 ENCOUNTER — Other Ambulatory Visit: Payer: Self-pay | Admitting: Physician Assistant

## 2023-11-11 NOTE — Progress Notes (Signed)
 [name] [name] is a 48 y.o. male here for a follow up of a pre-existing problem.  History of Present Illness:   Chief Complaint  Patient presents with   Medical Management of Chronic Issues    Hypertension, Anxiety/Depression and Pre-exposure prophylaxis for HIV.   Anxiety & Depression: Currently on Xanax  XR 3 mg daily and Viibryd 50 mg daily.  Tolerating well with no side effects.  He does report increased primary caregiver stress.  Overall his moods are well controlled.  Denies any SI/HI.  Followed by Dr Deborra Falter with psychiatry   Back Pain: Pt complains of constant central lower back pain. Pain has been ongoing several months to a year.  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   Denies weakness in lower extremities, numbness, tingling, saddle anesthesia, loss of bowel/bladder function  Hypertension: Condition is overall well controlled.  Pt is on Zestoretic  20-25 mg daily. Good compliance and tolerance with his current regimen.  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   Pre-exposure Prophylaxis for HIV: Compliant with Truvada  200-300 mg daily.  No acute concerns.   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   COVID-19 06/2021   Crohn's disease (HCC) followed by dr Willy Harvest   small and large intestines--  hx bowel resection 02/ 2009--  currently treated w/ humira    Essential hypertension, benign    History of CMV 10/14/2010   resolved w/ antiviral therapy   History of kidney stones    History of resection of small bowel    Hyperlipidemia    Major depressive disorder    OSA (obstructive sleep apnea)    01-05-2018 per pt resolved since tonsils removed    Ventral hernia    Vitamin D  deficiency      Social History   Tobacco Use   Smoking  status: Never   Smokeless tobacco: Never  Vaping Use   Vaping status: Never Used  Substance Use Topics   Alcohol use: Yes    Alcohol/week: 3.0 standard drinks of alcohol    Types: 3 Shots of liquor per week    Comment: rare   Drug use: Yes    Types: Marijuana    Past Surgical History:  Procedure Laterality Date   ANAL FISTULOTOMY N/A 01/07/2018   Procedure: ANAL FISTULOTOMY;  Surgeon: Joyce Nixon, MD;  Location: [facility];  Service: General;  Laterality: N/A;   COLONOSCOPY  last one 04-2017   dr Willy Harvest   EXPLORATORY LAPAROTOMY W/ ILEOSECECTOMY W/ PRIMARY ANASTAMOSIS  08-05-2007    dr gross  [facility]   crohn's, sbo, perferation's   GANGLION CYST EXCISION Right 2016   wrist   HYPOSPADIAS CORRECTION  CHILD   SPHINCTEROTOMY N/A 01/07/2018   Procedure: CHEMICAL SPHINCTEROTOMY (BOTOX );  Surgeon: Joyce Nixon, MD;  Location: [facility];  Service: General;  Laterality: N/A;   SPHINCTEROTOMY N/A 07/02/2022   Procedure: chemical SPHINCTEROTOMY;  Surgeon: Joyce Nixon, MD;  Location: [facility];  Service: General;  Laterality: N/A;   TONSILLECTOMY Bilateral 09/30/2015   Procedure: BILATERAL TONSILLECTOMY;  Surgeon: Janita Mellow, MD;  [facility]: [facility];  Service: ENT;  Laterality: Bilateral;   UPPER GASTROINTESTINAL ENDOSCOPY  10/15/2010   w/biopsy, mild gastritis and duodenitis  Family History  Problem Relation Age of Onset   Obesity Mother    Allergies Mother    Multiple sclerosis Mother    Heart disease Father        CAD/MI   Hyperlipidemia Father    Hypertension Father    Gout Father    Sleep apnea Father    Myasthenia gravis Maternal Grandfather    Stroke Maternal Grandfather    Stroke Paternal Grandmother 65   Pancreatic cancer Paternal Grandfather 59   Colon cancer Neg Hx    Diabetes Neg Hx    COPD Neg Hx    Colon polyps Neg Hx    Stomach cancer Neg Hx     No Known Allergies  Current Medications:   Current Outpatient  Medications:    acetaminophen  (TYLENOL ) 325 MG tablet, Take 650 mg by mouth every 6 (six) hours as needed for moderate pain., Disp: , Rfl:    Adalimumab -ryvk, 2 Pen, 40 MG/0.4ML AJKT, Inject into the skin., Disp: , Rfl:    albuterol  (VENTOLIN  HFA) 108 (90 Base) MCG/ACT inhaler, TAKE 2 PUFFS BY MOUTH EVERY 6 HOURS AS NEEDED FOR WHEEZE OR SHORTNESS OF BREATH, Disp: 8.5 each, Rfl: 5   ALPRAZolam  (XANAX  XR) 3 MG 24 hr tablet, TAKE 1 TABLET BY MOUTH EVERY DAY IN THE MORNING, Disp: 30 tablet, Rfl: 2   cyanocobalamin  (VITAMIN B12) 1000 MCG tablet, Take 1 tablet (1,000 mcg total) by mouth daily., Disp: , Rfl:    diphenoxylate -atropine  (LOMOTIL ) 2.5-0.025 MG tablet, TAKE 1 TABLET BY MOUTH 4 TIMES A DAY AS NEEDED FOR DIARRHEA/LOOSE STOOLS, Disp: 90 tablet, Rfl: 1   emtricitabine -tenofovir  (TRUVADA ) 200-300 MG tablet, TAKE 1 TABLET BY MOUTH EVERY DAY, Disp: 90 tablet, Rfl: 0   Fluticasone  Furoate (ARNUITY ELLIPTA ) 50 MCG/ACT AEPB, Inhale 1 puff into the lungs daily., Disp: 90 each, Rfl: 3   gabapentin  (NEURONTIN ) 600 MG tablet, Take 600 mg by mouth at bedtime., Disp: , Rfl:    ketoconazole  (NIZORAL ) 2 % cream, Apply 1 Application topically 2 (two) times daily., Disp: 60 g, Rfl: 1   L-Methylfolate-Algae (DEPLIN 15 PO), Take 1 capsule by mouth daily. A medical food supplement, Disp: , Rfl:    lisinopril -hydrochlorothiazide  (ZESTORETIC ) 20-25 MG tablet, TAKE 1 TABLET BY MOUTH EVERY DAY, Disp: 90 tablet, Rfl: 1   montelukast  (SINGULAIR ) 10 MG tablet, TAKE 1 TABLET BY MOUTH EVERYDAY AT BEDTIME, Disp: 90 tablet, Rfl: 3   nystatin -triamcinolone  ointment (MYCOLOG), Apply 1 Application topically 2 (two) times daily., Disp: 30 g, Rfl: 1   ondansetron  (ZOFRAN ) 4 MG tablet, TAKE 1 TAB BY MOUTH EVERY 6 HOURS (Patient taking differently: Take 4 mg by mouth every 6 (six) hours as needed for nausea or vomiting. Pt taking one 8 mg  as needed), Disp: 30 tablet, Rfl: 1   rosuvastatin  (CRESTOR ) 40 MG tablet, TAKE 1 TABLET DAILY,  Disp: 90 tablet, Rfl: 3   SPRAVATO, 84 MG DOSE, 28 MG/DEVICE SOPK, Place into both nostrils every 30 (thirty) days., Disp: , Rfl:    Vilazodone HCl (VIIBRYD) 10 MG TABS, Take 10 mg by mouth daily., Disp: , Rfl:    Vilazodone HCl (VIIBRYD) 40 MG TABS, Take 40 mg by mouth daily., Disp: , Rfl:    zolpidem  (AMBIEN ) 10 MG tablet, TAKE 1 TABLET BY MOUTH EVERY DAY AT BEDTIME AS NEEDED FOR SLEEP, Disp: 30 tablet, Rfl: 5   Review of Systems:   Negative unless otherwise specified per HPI.  Vitals:   Vitals:   11/12/23 1191  BP: 110/70  Pulse: 75  Temp: 98.2 F (36.8 C)  TempSrc: Temporal  SpO2: 94%  Weight: 222 lb 8 oz (100.9 kg)  Height: 5\' 8"  (1.727 m)     Body mass index is 33.83 kg/m.  Physical Exam:   Physical Exam Vitals and nursing note reviewed.  Constitutional:      General: He is not in acute distress.    Appearance: He is well-developed. He is not ill-appearing or toxic-appearing.  Cardiovascular:     Rate and Rhythm: Normal rate and regular rhythm.     Pulses: Normal pulses.     Heart sounds: Normal heart sounds, S1 normal and S2 normal.  Pulmonary:     Effort: Pulmonary effort is normal.     Breath sounds: Normal breath sounds.  Musculoskeletal:     Comments: No decreased ROM 2/2 pain with flexion/extension, lateral side bends, or rotation. Reproducible tenderness with deep palpation to bilateral paraspinal lumbar muscles. No bony tenderness. No evidence of erythema, rash or ecchymosis.       Skin:    General: Skin is warm and dry.  Neurological:     Mental Status: He is alert.     GCS: GCS eye subscore is 4. GCS verbal subscore is 5. GCS motor subscore is 6.  Psychiatric:        Speech: Speech normal.        Behavior: Behavior normal. Behavior is cooperative.     Assessment and Plan:   1. Anxiety with depression (Primary) Controlled per patient Continue management per psychiatry I discussed with patient that if they develop any SI, to tell someone  immediately and seek medical attention.  2. Chronic midline low back pain without sciatica No red flags Suspect musculoskeletal strain due to increase in activity Order xray Consider physical therapy vs sports medicine based on results Recommend NSAIDs as needed and gentle stretches - DG Lumbar Spine Complete; Future  3. On pre-exposure prophylaxis for HIV Compliant  Update blood work and urine screening Continue current regimen and follow up  - HIV Antibody (routine testing w rflx) - RPR - CBC with Differential/Platelet - Comprehensive metabolic panel with GFR - Urine cytology ancillary only  4. Essential hypertension, benign Normotensive Continue lisinopril -hydrochloroTHIAZIDE  20-25 mg daily If home monitoring shows consistent elevation, or any symptom(s) develop, recommend reach out to us  for further advice on next steps  5. Need for prophylactic vaccination with combined diphtheria-tetanus-pertussis (DTP) vaccine - Tdap vaccine greater than or equal to 7yo IM    I, Bernita Bristle, acting as a Neurosurgeon for [employer], [place]., have documented all relevant documentation on the behalf of Alexander Iba, [place], as directed by   while in the presence of Alexander Iba, [place].  I, Alexander Iba, [place], have reviewed all documentation for this visit. The documentation on 11/12/23 for the exam, diagnosis, procedures, and orders are all accurate and complete.  Alexander Iba, [name]

## 2023-11-12 ENCOUNTER — Other Ambulatory Visit (HOSPITAL_COMMUNITY)
Admission: RE | Admit: 2023-11-12 | Discharge: 2023-11-12 | Disposition: A | Discharge status: Home / Self Care | Source: Ambulatory Visit | Attending: Physician Assistant | Admitting: Physician Assistant

## 2023-11-12 ENCOUNTER — Ambulatory Visit: Payer: BC Managed Care – PPO | Admitting: Physician Assistant

## 2023-11-12 ENCOUNTER — Ambulatory Visit (INDEPENDENT_AMBULATORY_CARE_PROVIDER_SITE_OTHER)

## 2023-11-12 VITALS — BP 110/70 | HR 75 | Temp 98.2°F | Ht 68.0 in | Wt 222.5 lb

## 2023-11-12 DIAGNOSIS — Z23 Encounter for immunization: Secondary | ICD-10-CM | POA: Diagnosis not present

## 2023-11-12 DIAGNOSIS — F418 Other specified anxiety disorders: Secondary | ICD-10-CM | POA: Diagnosis not present

## 2023-11-12 DIAGNOSIS — Z79899 Other long term (current) drug therapy: Secondary | ICD-10-CM

## 2023-11-12 DIAGNOSIS — G8929 Other chronic pain: Secondary | ICD-10-CM

## 2023-11-12 DIAGNOSIS — M545 Low back pain, unspecified: Secondary | ICD-10-CM | POA: Diagnosis not present

## 2023-11-12 DIAGNOSIS — I10 Essential (primary) hypertension: Secondary | ICD-10-CM | POA: Diagnosis not present

## 2023-11-12 LAB — CBC WITH DIFFERENTIAL/PLATELET
Basophils Absolute: 0 10*3/uL (ref 0.0–0.1)
Basophils Relative: 0.6 % (ref 0.0–3.0)
Eosinophils Absolute: 0.2 10*3/uL (ref 0.0–0.7)
Eosinophils Relative: 2 % (ref 0.0–5.0)
HCT: 44 % (ref 39.0–52.0)
Hemoglobin: 14.7 g/dL (ref 13.0–17.0)
Lymphocytes Relative: 28.9 % (ref 12.0–46.0)
Lymphs Abs: 2.3 10*3/uL (ref 0.7–4.0)
MCHC: 33.3 g/dL (ref 30.0–36.0)
MCV: 100.2 fl — ABNORMAL HIGH (ref 78.0–100.0)
Monocytes Absolute: 0.8 10*3/uL (ref 0.1–1.0)
Monocytes Relative: 10.8 % (ref 3.0–12.0)
Neutro Abs: 4.5 10*3/uL (ref 1.4–7.7)
Neutrophils Relative %: 57.7 % (ref 43.0–77.0)
Platelets: 239 10*3/uL (ref 150.0–400.0)
RBC: 4.39 Mil/uL (ref 4.22–5.81)
RDW: 14.9 % (ref 11.5–15.5)
WBC: 7.8 10*3/uL (ref 4.0–10.5)

## 2023-11-12 LAB — COMPREHENSIVE METABOLIC PANEL WITH GFR
ALT: 21 U/L (ref 0–53)
AST: 25 U/L (ref 0–37)
Albumin: 4.9 g/dL (ref 3.5–5.2)
Alkaline Phosphatase: 53 U/L (ref 39–117)
BUN: 20 mg/dL (ref 6–23)
CO2: 28 meq/L (ref 19–32)
Calcium: 9.5 mg/dL (ref 8.4–10.5)
Chloride: 100 meq/L (ref 96–112)
Creatinine, Ser: 1.32 mg/dL (ref 0.40–1.50)
GFR: 63.7 mL/min (ref >60.00)
Glucose, Bld: 88 mg/dL (ref 70–99)
Potassium: 3.9 meq/L (ref 3.5–5.1)
Sodium: 136 meq/L (ref 135–145)
Total Bilirubin: 0.5 mg/dL (ref 0.2–1.2)
Total Protein: 7.9 g/dL (ref 6.0–8.3)

## 2023-11-12 NOTE — Patient Instructions (Addendum)
 It was great to see you!  Follow up in 3 months for blood work only  Schedule Comprehensive Physical Exam (CPE) preventive care annual visit in 6 months  Take care,  Jevin Camino [name]

## 2023-11-13 LAB — RPR: RPR Ser Ql: NONREACTIVE

## 2023-11-13 LAB — HIV ANTIBODY (ROUTINE TESTING W REFLEX): HIV 1&2 Ab, 4th Generation: NONREACTIVE

## 2023-11-15 ENCOUNTER — Ambulatory Visit: Payer: Self-pay | Admitting: Physician Assistant

## 2023-11-16 ENCOUNTER — Other Ambulatory Visit: Payer: Self-pay | Admitting: Physician Assistant

## 2023-11-16 ENCOUNTER — Other Ambulatory Visit: Payer: Self-pay | Admitting: Internal Medicine

## 2023-11-16 DIAGNOSIS — Z79899 Other long term (current) drug therapy: Secondary | ICD-10-CM

## 2023-11-16 LAB — URINE CYTOLOGY ANCILLARY ONLY
Chlamydia: NEGATIVE
Comment: NEGATIVE
Comment: NEGATIVE
Comment: NORMAL
Neisseria Gonorrhea: NEGATIVE
Trichomonas: NEGATIVE

## 2023-11-16 MED ORDER — EMTRICITABINE-TENOFOVIR DF 200-300 MG PO TABS
1.0000 | ORAL_TABLET | Freq: Every day | ORAL | 0 refills | Status: AC
Start: 2023-11-16 — End: ?

## 2023-11-17 ENCOUNTER — Other Ambulatory Visit: Payer: Self-pay

## 2023-11-17 ENCOUNTER — Encounter: Payer: Self-pay | Admitting: Internal Medicine

## 2023-11-17 MED ORDER — ADALIMUMAB-RYVK (2 PEN) 40 MG/0.4ML ~~LOC~~ AJKT
40.0000 mg | AUTO-INJECTOR | SUBCUTANEOUS | 0 refills | Status: DC
Start: 2023-11-17 — End: 2024-02-07

## 2023-11-17 NOTE — Telephone Encounter (Signed)
Please advise Sir? 

## 2023-11-19 ENCOUNTER — Other Ambulatory Visit: Payer: Self-pay | Admitting: Physician Assistant

## 2023-11-19 MED ORDER — CELECOXIB 100 MG PO CAPS: 100.0000 mg | ORAL_CAPSULE | Freq: Two times a day (BID) | ORAL | 1 refills | Status: DC | PRN

## 2023-11-22 ENCOUNTER — Other Ambulatory Visit: Payer: Self-pay | Admitting: Physician Assistant

## 2023-11-22 MED ORDER — CELECOXIB 100 MG PO CAPS: 100.0000 mg | ORAL_CAPSULE | Freq: Two times a day (BID) | ORAL | 1 refills | Status: DC | PRN

## 2023-12-02 ENCOUNTER — Other Ambulatory Visit: Payer: Self-pay | Admitting: Physician Assistant

## 2023-12-02 DIAGNOSIS — Z79899 Other long term (current) drug therapy: Secondary | ICD-10-CM

## 2024-02-06 ENCOUNTER — Encounter: Payer: Self-pay | Admitting: Physician Assistant

## 2024-02-06 ENCOUNTER — Other Ambulatory Visit: Payer: Self-pay | Admitting: Internal Medicine

## 2024-02-07 MED ORDER — ROSUVASTATIN CALCIUM 40 MG PO TABS
40.0000 mg | ORAL_TABLET | Freq: Every day | ORAL | 3 refills | Status: AC
Start: 2024-02-07 — End: ?

## 2024-02-11 ENCOUNTER — Other Ambulatory Visit (INDEPENDENT_AMBULATORY_CARE_PROVIDER_SITE_OTHER)

## 2024-02-11 ENCOUNTER — Other Ambulatory Visit (HOSPITAL_COMMUNITY)
Admission: RE | Admit: 2024-02-11 | Discharge: 2024-02-11 | Disposition: A | Discharge status: Home / Self Care | Source: Ambulatory Visit | Attending: Physician Assistant | Admitting: Physician Assistant

## 2024-02-11 DIAGNOSIS — Z79899 Other long term (current) drug therapy: Secondary | ICD-10-CM

## 2024-02-14 LAB — URINE CYTOLOGY ANCILLARY ONLY
Chlamydia: NEGATIVE
Comment: NEGATIVE
Comment: NEGATIVE
Comment: NORMAL
Neisseria Gonorrhea: NEGATIVE
Trichomonas: NEGATIVE

## 2024-02-15 ENCOUNTER — Other Ambulatory Visit: Payer: Self-pay | Admitting: Physician Assistant

## 2024-02-15 ENCOUNTER — Ambulatory Visit: Payer: Self-pay | Admitting: Physician Assistant

## 2024-02-15 DIAGNOSIS — Z79899 Other long term (current) drug therapy: Secondary | ICD-10-CM

## 2024-02-15 LAB — RPR

## 2024-02-15 LAB — HIV ANTIBODY (ROUTINE TESTING W REFLEX): HIV 1&2 Ab, 4th Generation: NONREACTIVE

## 2024-02-15 MED ORDER — EMTRICITABINE-TENOFOVIR DF 200-300 MG PO TABS: 1.0000 | ORAL_TABLET | Freq: Every day | ORAL | 0 refills | Status: DC

## 2024-02-24 ENCOUNTER — Other Ambulatory Visit: Payer: Self-pay | Admitting: Physician Assistant

## 2024-03-09 ENCOUNTER — Other Ambulatory Visit: Payer: Self-pay | Admitting: Physician Assistant

## 2024-03-31 ENCOUNTER — Encounter: Payer: Self-pay | Admitting: Family Medicine

## 2024-03-31 ENCOUNTER — Ambulatory Visit (INDEPENDENT_AMBULATORY_CARE_PROVIDER_SITE_OTHER): Admitting: Family Medicine

## 2024-03-31 ENCOUNTER — Ambulatory Visit: Payer: Self-pay

## 2024-03-31 ENCOUNTER — Ambulatory Visit (INDEPENDENT_AMBULATORY_CARE_PROVIDER_SITE_OTHER)

## 2024-03-31 VITALS — BP 98/60 | HR 58 | Temp 98.0°F | Wt 218.9 lb

## 2024-03-31 DIAGNOSIS — M533 Sacrococcygeal disorders, not elsewhere classified: Secondary | ICD-10-CM

## 2024-03-31 MED ORDER — HYDROCODONE-ACETAMINOPHEN 5-325 MG PO TABS: 1.0000 | ORAL_TABLET | Freq: Four times a day (QID) | ORAL | 0 refills | Status: DC | PRN

## 2024-03-31 NOTE — Patient Instructions (Signed)
 Be sure to avoid alcohol and ambien  when taking the Vicodin.

## 2024-03-31 NOTE — Telephone Encounter (Signed)
 Noted

## 2024-03-31 NOTE — Telephone Encounter (Signed)
 FYI Only or Action Required?: Action required by provider: request for appointment.  Patient was last seen in primary care on 11/12/2023 by Job Lukes, PA.  Called Nurse Triage reporting Fall and Tailbone Pain.  Symptoms began yesterday.  Interventions attempted: OTC medications: tylenol .  Symptoms are: gradually worsening.  Triage Disposition: See Physician Within 24 Hours  Patient/caregiver understands and will follow disposition?: Yes   Copied from CRM [id number]. Topic: Clinical - Red Word Triage >> Mar 31, 2024 10:32 AM [name] [name] wrote: Red Word that prompted transfer to Nurse Triage: Patient having pain in tailbone area, fell lastnight Reason for Disposition  [1] MODERATE pain (e.g., interferes with normal activities) AND [2] high-risk adult (e.g., age > 60 years, osteoporosis, chronic steroid use)  Answer Assessment - Initial Assessment Questions No available appts with PCP/clinic. Scheduled alternative provider, 03/31/24.  1. MECHANISM: How did the injury happen?       Moving furniture, backed up, fell on tailbone 2. ONSET: When did the injury happen? (e.g., minutes, hours ago)      yesterday 3. LOCATION: Where is the injury located?      Tail bone 4. SEVERITY: Can you sit? Can you walk?      Uncomfortable sit, walk, get up, sneeze, cough 5. PAIN: Is there pain? If Yes, ask: How bad is the pain?  (Scale 0-10; none, mild, moderate, or severe)     Rest 2/10, movement 8/10; Tylenol , heating pad 6. SIZE: For bruises, or swelling, ask: How large is it? (e.g., inches or centimeters)      No; feel bump, size of dime size 7. OTHER SYMPTOMS: Do you have any other symptoms? (e.g., numbness, back pain)     Denies back pain, numbness/ weakness, problems with b/b, blood urination  Protocols used: Tailbone Injury-A-AH

## 2024-03-31 NOTE — Progress Notes (Signed)
 Established Patient Office Visit  Subjective   Patient ID: [name] [name], male    DOB: 1975/12/07  Age: 48 y.o. MRN: [id number]  Chief Complaint  Patient presents with   Flank Pain    HPI   [name] [name] is seen as a work in with sacrococcygeal pain which started last night after he fell when moving some furniture.  He had a ottoman he was backing up and tripped over.  He states he landed directly on the floor on his sacrum region.  Has had difficulty with walking and sleeping last night since then.  No visible bruising or swelling.  Has pain sitting directly over his sacrum or coccyx region.  No other injuries reported.  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   COVID-19 06/2021   Crohn's disease (HCC) followed by dr [name] [name]   small and large intestines--  hx bowel resection 02/ 2009--  currently treated w/ humira    Essential hypertension, benign    History of CMV 10/14/2010   resolved w/ antiviral therapy   History of kidney stones    History of resection of small bowel    Hyperlipidemia    Major depressive disorder    OSA (obstructive sleep apnea)    01-05-2018 per pt resolved since tonsils removed    Ventral hernia    Vitamin D  deficiency    Past Surgical History:  Procedure Laterality Date   ANAL FISTULOTOMY N/A 01/07/2018   Procedure: ANAL FISTULOTOMY;  Surgeon: [name] Hila, MD;  Location: [facility];  Service: General;  Laterality: N/A;   COLONOSCOPY  last one 04-2017   dr [name] [name]   EXPLORATORY LAPAROTOMY W/ ILEOSECECTOMY W/ PRIMARY ANASTAMOSIS  08-05-2007    dr [name] [name]  [facility]   crohn's, sbo, perferation's   GANGLION CYST EXCISION Right 2016   wrist   HYPOSPADIAS CORRECTION  CHILD   SPHINCTEROTOMY N/A 01/07/2018   Procedure: CHEMICAL SPHINCTEROTOMY (BOTOX );  Surgeon: [name] Hila, MD;  Location: [facility];  Service:  General;  Laterality: N/A;   SPHINCTEROTOMY N/A 07/02/2022   Procedure: chemical SPHINCTEROTOMY;  Surgeon: [name] Hila, MD;  Location: [facility];  Service: General;  Laterality: N/A;   TONSILLECTOMY Bilateral 09/30/2015   Procedure: BILATERAL TONSILLECTOMY;  Surgeon: [name] Loader, MD;  Location: [facility] OR;  Service: ENT;  Laterality: Bilateral;   UPPER GASTROINTESTINAL ENDOSCOPY  10/15/2010   w/biopsy, mild gastritis and duodenitis    reports that he has never smoked. He has never used smokeless tobacco. He reports current alcohol use of about 3.0 standard drinks of alcohol per week. He reports current drug use. Drug: Marijuana. family history includes Allergies in his mother; Gout in his father; Heart disease in his father; Hyperlipidemia in his father; Hypertension in his father; Multiple sclerosis in his mother; Myasthenia gravis in his maternal grandfather; Obesity in his mother; Pancreatic cancer (age of onset: 83) in his paternal grandfather; Sleep apnea in his father; Stroke in his maternal grandfather; Stroke (age of onset: 70) in his paternal grandmother. No Known Allergies  Review of Systems  Musculoskeletal:  Positive for back pain.  Neurological:  Negative for focal weakness and headaches.      Objective:     BP 98/60   Pulse (!) 58   Temp 98 F (36.7 C) (Oral)   Wt 218 lb 14.4 oz (99.3 kg)  SpO2 96%   BMI 33.28 kg/m    Physical Exam Vitals reviewed.  Constitutional:      General: He is not in acute distress.    Appearance: He is not ill-appearing.  Cardiovascular:     Rate and Rhythm: Normal rate and regular rhythm.  Pulmonary:     Effort: Pulmonary effort is normal.     Breath sounds: Normal breath sounds.  Musculoskeletal:     Comments: No visible swelling or ecchymosis sacrococcygeal region.  Does have tenderness over the lower sacral and coccyx region.  Nontender over lumbar spine  Neurological:     Mental Status: He is alert.      No  results found for any visits on 03/31/24.    The 10-year ASCVD risk score (Arnett DK, et al., 2019) is: 2%    Assessment & Plan:   Problem List Items Addressed This Visit   None Visit Diagnoses       Sacrococcygeal pain    -  Primary   Relevant Medications   HYDROcodone -acetaminophen  (NORCO/VICODIN) 5-325 MG tablet   Other Relevant Orders   DG Sacrum/Coccyx     We did discuss x-rays although this would not likely change management but patient is interested in ruling out fracture.  He had very difficult time sleeping last night with Tylenol .  He has not gotten much benefit from tramadol  in the past.  We did express caution about him not mixing any pain medicine with alcohol, Ambien .  He does take extended release Xanax  but is down to one half of 3 mg daily and is in process of tapering further.  We wrote for limited hydrocodone  5/325 mg 1 every 6 hours as needed for severe pain.  He is aware this will likely take several weeks to resolve even if no fracture  No follow-ups on file.    [name] Scarlet, MD

## 2024-04-05 ENCOUNTER — Ambulatory Visit: Payer: Self-pay | Admitting: Family Medicine

## 2024-04-05 ENCOUNTER — Telehealth: Payer: Self-pay | Admitting: Family Medicine

## 2024-04-05 NOTE — Telephone Encounter (Unsigned)
 Copied from CRM #[id number]. Topic: Clinical - Medication Refill >> Apr 05, 2024 12:33 PM Zy'onna H wrote: Medication: HYDROcodone -acetaminophen  (NORCO/VICODIN) 5-325 MG tablet **Patient stated the Rx was originally prescribed for 5 days - still having trouble with the following and is requesting a refill.** -traveling back and forth to the car - sleeping throughout the night   Has the patient contacted their pharmacy? Yes (Agent: If no, request that the patient contact the pharmacy for the refill. If patient does not wish to contact the pharmacy document the reason why and proceed with request.) (Agent: If yes, when and what did the pharmacy advise?)  This is the patient's preferred pharmacy:  [id number] [place], [place] - [address] AT [address] [place] [address] Phone: [phone] Fax: [phone]  Is this the correct pharmacy for this prescription? Yes If no, delete pharmacy and type the correct one.   Has the prescription been filled recently? Yes  Is the patient out of the medication? Yes  Has the patient been seen for an appointment in the last year OR does the patient have an upcoming appointment? Yes  Can we respond through MyChart? Yes  Agent: Please be advised that Rx refills may take up to 3 business days. We ask that you follow-up with your pharmacy.

## 2024-04-06 MED ORDER — HYDROCODONE-ACETAMINOPHEN 5-325 MG PO TABS: 1.0000 | ORAL_TABLET | Freq: Four times a day (QID) | ORAL | 0 refills | Status: DC | PRN

## 2024-04-06 NOTE — Telephone Encounter (Signed)
 I sent in one refill of Hydrocodone .  If pain persists to extent of needing additional meds, recommend follow up with Samantha Worley.  Wolm LELON Scarlet MD [facility] at [facility]

## 2024-04-06 NOTE — Addendum Note (Signed)
 Addended by: MICHEAL WOLM ORN on: 04/06/2024 02:55 PM   Modules accepted: Orders

## 2024-04-06 NOTE — Telephone Encounter (Signed)
 Addressed in separate note.  Wolm LELON Scarlet MD [facility] at [facility]

## 2024-04-13 ENCOUNTER — Ambulatory Visit: Admitting: Internal Medicine

## 2024-05-01 ENCOUNTER — Other Ambulatory Visit: Payer: Self-pay | Admitting: Internal Medicine

## 2024-05-01 MED ORDER — ADALIMUMAB-RYVK (2 PEN) 40 MG/0.4ML ~~LOC~~ AJKT: 40.0000 mg | AUTO-INJECTOR | SUBCUTANEOUS | 1 refills | Status: DC

## 2024-05-01 NOTE — Telephone Encounter (Signed)
 We can refill but he needs a quantiferon test  Thanks

## 2024-05-01 NOTE — Telephone Encounter (Signed)
 Ok to fill Dr Avram

## 2024-05-01 NOTE — Addendum Note (Signed)
 Addended by: ANITRA ODETTA CROME on: 05/01/2024 04:37 PM   Modules accepted: Orders

## 2024-05-10 ENCOUNTER — Telehealth: Payer: Self-pay | Admitting: Physician Assistant

## 2024-05-10 NOTE — Telephone Encounter (Signed)
 My chart message sent

## 2024-05-10 NOTE — Telephone Encounter (Signed)
 Patient had to r/s CPE from 05/26/24 due to PCP being out of office. Pt has been rescheduled to next available on 1/30. Can pt medication be refilled to last until CPE in January? Please advise.

## 2024-05-10 NOTE — Telephone Encounter (Signed)
 Okay to fill Truvada  till appt in Jan. Pt was rescheduled due to provider out.

## 2024-05-11 ENCOUNTER — Other Ambulatory Visit: Payer: Self-pay | Admitting: Physician Assistant

## 2024-05-11 DIAGNOSIS — Z79899 Other long term (current) drug therapy: Secondary | ICD-10-CM

## 2024-05-12 ENCOUNTER — Other Ambulatory Visit (HOSPITAL_COMMUNITY)
Admission: RE | Admit: 2024-05-12 | Discharge: 2024-05-12 | Disposition: A | Discharge status: Home / Self Care | Source: Ambulatory Visit | Attending: Physician Assistant | Admitting: Physician Assistant

## 2024-05-12 ENCOUNTER — Telehealth: Payer: Self-pay | Admitting: Physician Assistant

## 2024-05-12 ENCOUNTER — Other Ambulatory Visit

## 2024-05-12 DIAGNOSIS — Z79899 Other long term (current) drug therapy: Secondary | ICD-10-CM

## 2024-05-12 NOTE — Telephone Encounter (Signed)
 See other message, spoke to pt urine was done.

## 2024-05-12 NOTE — Telephone Encounter (Signed)
 Spoke to pt he said they did collect the urine then. Told him okay.

## 2024-05-12 NOTE — Telephone Encounter (Addendum)
 Patient states he is at [facility] and that the Order for Urine Test says [facility] on it, therefore [facility] said they will not do the Urinalysis. Patient requests to be called.

## 2024-05-13 LAB — HIV ANTIBODY (ROUTINE TESTING W REFLEX)
HIV 1&2 Ab, 4th Generation: NONREACTIVE
HIV FINAL INTERPRETATION: NEGATIVE

## 2024-05-13 LAB — SYPHILIS: RPR W/REFLEX TO RPR TITER AND TREPONEMAL ANTIBODIES, TRADITIONAL SCREENING AND DIAGNOSIS ALGORITHM: RPR Ser Ql: NONREACTIVE

## 2024-05-14 ENCOUNTER — Other Ambulatory Visit: Payer: Self-pay | Admitting: Physician Assistant

## 2024-05-14 DIAGNOSIS — Z79899 Other long term (current) drug therapy: Secondary | ICD-10-CM

## 2024-05-15 ENCOUNTER — Other Ambulatory Visit: Payer: Self-pay | Admitting: Physician Assistant

## 2024-05-15 ENCOUNTER — Ambulatory Visit: Payer: Self-pay | Admitting: Physician Assistant

## 2024-05-15 DIAGNOSIS — Z79899 Other long term (current) drug therapy: Secondary | ICD-10-CM

## 2024-05-15 LAB — URINE CYTOLOGY ANCILLARY ONLY
Chlamydia: NEGATIVE
Comment: NEGATIVE
Comment: NEGATIVE
Comment: NORMAL
Neisseria Gonorrhea: NEGATIVE
Trichomonas: NEGATIVE

## 2024-05-15 MED ORDER — EMTRICITABINE-TENOFOVIR DF 200-300 MG PO TABS: 1.0000 | ORAL_TABLET | Freq: Every day | ORAL | 0 refills | Status: AC

## 2024-05-26 ENCOUNTER — Encounter: Admitting: Physician Assistant

## 2024-06-08 ENCOUNTER — Other Ambulatory Visit (INDEPENDENT_AMBULATORY_CARE_PROVIDER_SITE_OTHER)

## 2024-06-08 ENCOUNTER — Encounter: Payer: Self-pay | Admitting: Internal Medicine

## 2024-06-08 ENCOUNTER — Ambulatory Visit: Admitting: Internal Medicine

## 2024-06-08 VITALS — BP 116/66 | HR 74 | Ht 69.0 in | Wt 219.0 lb

## 2024-06-08 DIAGNOSIS — Z7960 Long term (current) use of unspecified immunomodulators and immunosuppressants: Secondary | ICD-10-CM

## 2024-06-08 DIAGNOSIS — K50813 Crohn's disease of both small and large intestine with fistula: Secondary | ICD-10-CM

## 2024-06-08 DIAGNOSIS — Z111 Encounter for screening for respiratory tuberculosis: Secondary | ICD-10-CM | POA: Diagnosis not present

## 2024-06-08 DIAGNOSIS — Z79620 Long term (current) use of immunosuppressive biologic: Secondary | ICD-10-CM | POA: Diagnosis not present

## 2024-06-08 NOTE — Progress Notes (Signed)
 [name] [name] 48 y.o. Feb 01, 1976 [id number]  Assessment & Plan:   Encounter Diagnoses  Name Primary?   Crohn's disease of both small and large intestine with fistula (HCC) Yes   Long-term use of immunosuppressant medication    Tuberculosis screening     [name] [name] is clinically well using weekly adalimumab  immunosuppression for his Crohn's disease and 1 diphenoxylate  and atropine  tablet every day.  He will continue this high risk but successful and tolerated medication regimen.  He will do a fecal calprotectin to assess disease status in a more objective fashion and his annual QuantiFERON testing.  Anticipate return visit routinely in 1 year or sooner if needed.    Subjective:  Gastroenterology summary:   Crohn's disease of the small and large intestine with history of ilio cecal resection, and perianal fistula Diagnosed 2005, has been on azathioprine and Remicade  on and off On Remicade  monotherapy now since 2009 right hemicolectomy (perforation) and recuuernt perianal fistula (controlled) Azathioprine restarted 11/2009. Due to data showing better outcomes on dual tx (biologic and immunomodulator).  Azathioprine discontinued May 2011 do to CMV infection. 05/2013 - has antibodies to infliximab  06/30/2013 initiating Humira  November 2018 colonoscopy with inflammatory changes at the anastomosis, small bowel and colon were normal 07/2017 weekly Humira  started Late 2021 flare treated with budesonide  (CT enterography abnormal TI) Colonoscopy November 2023-patchy terminal ileitis, anal fissure, anastomotic ulcer, and normal colon biopsies Adalimumab  level 12 and no antibodies July 2024   Chronic posterior anal fissure status post Botox  injection   Bile salt induced diarrhea after ileocolonic resection   B12 deficiency   Musculoskeletal right upper quadrant pain successfully treated with steroid injection for peripheral nerve entrapment syndrome   Perianal dermatitis   Normal EGD  2014 no evidence of Crohn's disease of the upper GI tract   Chronic nausea cause unclear  -----------------------------------------------------------------------------------------  Chief Complaint: Follow-up of Crohn's disease  HPI 48 year old man with a history of Crohn's disease of the small and large intestine status post ileocolonic resection, maintained on weekly adalimumab , here for follow-up.  He was last seen in November 2024.  Gastrointestinal symptoms - No diarrhea or gastrointestinal bleeding - Bowel movements occasionally runny but regular - No abdominal pain  Crohn's disease management - Currently on weekly adalimumab  (Humira ) - Takes diphenoxylate  and atropine  daily for diarrhea control, which maintains regularity - No longer uses cholestyramine, feels it is unnecessary  He had successful treatment of right upper quadrant abdominal wall pain and nerve entrapment syndrome by [name] [name] (second injection) July 22, 2023.  Social history he continues to have a caregiver burden with his elderly father and his spouse who has a history of rectal cancer and has a colostomy.  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  Lab Results  Component Value Date   WBC 7.8 11/12/2023   HGB 14.7 11/12/2023   HCT 44.0 11/12/2023   MCV 100.2 (H) 11/12/2023   PLT 239.0 11/12/2023     Chemistry      Component Value Date/Time   NA 136 11/12/2023 0906   K 3.9 11/12/2023 0906   CL 100 11/12/2023 0906   CO2 28 11/12/2023 0906   BUN 20 11/12/2023 0906   CREATININE 1.32 11/12/2023 0906   CREATININE 1.21 06/05/2021 0921      Component Value Date/Time   CALCIUM  9.5 11/12/2023 0906   ALKPHOS 53 11/12/2023 0906   AST 25 11/12/2023 0906   ALT 21 11/12/2023 0906   BILITOT 0.5 11/12/2023 0906  Allergies[1] Active Medications[2] Past Medical History:  Diagnosis Date   Anal fissure and fistula    chronic   Anxiety    Bile salt-induced diarrhea after  ielo-colonic resection 12/20/2017   COVID-19 06/2021   Crohn's disease (HCC) followed by dr [name] [name]   small and large intestines--  hx bowel resection 02/ 2009--  currently treated w/ humira    Essential hypertension, benign    History of CMV 10/14/2010   resolved w/ antiviral therapy   History of kidney stones    History of resection of small bowel    Hyperlipidemia    Major depressive disorder    OSA (obstructive sleep apnea)    01-05-2018 per pt resolved since tonsils removed    Ventral hernia    Vitamin D  deficiency    Past Surgical History:  Procedure Laterality Date   ANAL FISTULOTOMY N/A 01/07/2018   Procedure: ANAL FISTULOTOMY;  Surgeon: [name] Hila, MD;  Location: [facility];  Service: General;  Laterality: N/A;   COLONOSCOPY  last one 04-2017   dr [name] [name]   EXPLORATORY LAPAROTOMY W/ ILEOSECECTOMY W/ PRIMARY ANASTAMOSIS  08-05-2007    dr [name] [name]  [facility]   crohn's, sbo, perferation's   GANGLION CYST EXCISION Right 2016   wrist   HYPOSPADIAS CORRECTION  CHILD   SPHINCTEROTOMY N/A 01/07/2018   Procedure: CHEMICAL SPHINCTEROTOMY (BOTOX );  Surgeon: [name] Hila, MD;  Location: [name] [place];  Service: General;  Laterality: N/A;   SPHINCTEROTOMY N/A 07/02/2022   Procedure: chemical SPHINCTEROTOMY;  Surgeon: [name] Hila, MD;  Location: [facility];  Service: General;  Laterality: N/A;   TONSILLECTOMY Bilateral 09/30/2015   Procedure: BILATERAL TONSILLECTOMY;  Surgeon: [name] Loader, MD;  Location: [facility] OR;  Service: ENT;  Laterality: Bilateral;   UPPER GASTROINTESTINAL ENDOSCOPY  10/15/2010   w/biopsy, mild gastritis and duodenitis   Social History   Social History Narrative   [employer], [address] - 2 years. Married '99 - 62yrs/divorced. 2 sons - '98, '00 split custody. Work - artist. Lives with partner No exercise. No history of physical or sexual abuse.    [name]   family history includes Allergies in his  mother; Gout in his father; Heart disease in his father; Hyperlipidemia in his father; Hypertension in his father; Multiple sclerosis in his mother; Myasthenia gravis in his maternal grandfather; Obesity in his mother; Pancreatic cancer (age of onset: 44) in his paternal grandfather; Sleep apnea in his father; Stroke in his maternal grandfather; Stroke (age of onset: 66) in his paternal grandmother.   Review of Systems As per HPI  Objective:   Physical Exam @BP  116/66   Pulse 74   Ht 5' 9 (1.753 m)   Wt 219 lb (99.3 kg)   BMI 32.34 kg/m @  General:  NAD Eyes:   anicteric Lungs:  clear Heart::  S1S2 no rubs, murmurs or gallops Abdomen:  soft and nontender, BS+, obese with well-healed surgical scars    Data Reviewed:  See HPI and summary    [1] No Known Allergies [2]  Current Meds  Medication Sig   acetaminophen  (TYLENOL ) 325 MG tablet Take 650 mg by mouth every 6 (six) hours as needed for moderate pain.   Adalimumab -ryvk, 2 Pen, 40 MG/0.4ML AJKT Inject 40 mg as directed once a week.   albuterol  (VENTOLIN  HFA) 108 (90 Base) MCG/ACT inhaler TAKE 2 PUFFS BY MOUTH EVERY 6 HOURS AS NEEDED FOR WHEEZE OR SHORTNESS OF BREATH   ALPRAZolam  (XANAX  XR)  3 MG 24 hr tablet TAKE 1 TABLET BY MOUTH EVERY DAY IN THE MORNING   celecoxib  (CELEBREX ) 100 MG capsule Take 1 capsule (100 mg total) by mouth 2 (two) times daily as needed.   cyanocobalamin  (VITAMIN B12) 1000 MCG tablet Take 1 tablet (1,000 mcg total) by mouth daily.   diphenoxylate -atropine  (LOMOTIL ) 2.5-0.025 MG tablet TAKE 1 TABLET BY MOUTH 4 TIMES A DAY AS NEEDED FOR DIARRHEA/LOOSE STOOLS   emtricitabine -tenofovir  (TRUVADA ) 200-300 MG tablet Take 1 tablet by mouth daily.   Fluticasone  Furoate (ARNUITY ELLIPTA ) 50 MCG/ACT AEPB Inhale 1 puff into the lungs daily.   gabapentin  (NEURONTIN ) 600 MG tablet Take 600 mg by mouth at bedtime.   ketoconazole  (NIZORAL ) 2 % cream Apply 1 Application topically 2 (two) times daily.    L-Methylfolate-Algae (DEPLIN 15 PO) Take 1 capsule by mouth daily. A medical food supplement   lisinopril -hydrochlorothiazide  (ZESTORETIC ) 20-25 MG tablet TAKE 1 TABLET BY MOUTH EVERY DAY   montelukast  (SINGULAIR ) 10 MG tablet TAKE 1 TABLET BY MOUTH EVERYDAY AT BEDTIME   nystatin -triamcinolone  ointment (MYCOLOG) Apply 1 Application topically 2 (two) times daily.   ondansetron  (ZOFRAN ) 4 MG tablet TAKE 1 TAB BY MOUTH EVERY 6 HOURS (Patient taking differently: Take 4 mg by mouth every 6 (six) hours as needed for nausea or vomiting. Pt taking one 8 mg  as needed)   rosuvastatin  (CRESTOR ) 40 MG tablet Take 1 tablet (40 mg total) by mouth daily.   SPRAVATO, 84 MG DOSE, 28 MG/DEVICE SOPK Place into both nostrils every 30 (thirty) days.   Vilazodone HCl (VIIBRYD) 10 MG TABS Take 10 mg by mouth daily.   Vilazodone HCl (VIIBRYD) 40 MG TABS Take 40 mg by mouth daily.   zolpidem  (AMBIEN ) 10 MG tablet TAKE 1 TABLET BY MOUTH EVERY DAY AT BEDTIME AS NEEDED FOR SLEEP

## 2024-06-08 NOTE — Patient Instructions (Signed)
 Your provider has requested that you go to the basement level for lab work before leaving today. Press "B" on the elevator. The lab is located at the first door on the left as you exit the elevator.  Due to recent changes in healthcare laws, you may see the results of your imaging and laboratory studies on MyChart before your provider has had a chance to review them.  We understand that in some cases there may be results that are confusing or concerning to you. Not all laboratory results come back in the same time frame and the provider may be waiting for multiple results in order to interpret others.  Please give [place] 48 hours in order for your provider to thoroughly review all the results before contacting the office for clarification of your results.   I appreciate the opportunity to care for you. Stan Head, MD, [facility]

## 2024-06-08 NOTE — Addendum Note (Signed)
 Addended by: Maciah Feeback E on: 06/08/2024 03:02 PM   Modules accepted: Orders

## 2024-06-09 ENCOUNTER — Other Ambulatory Visit (INDEPENDENT_AMBULATORY_CARE_PROVIDER_SITE_OTHER)

## 2024-06-09 DIAGNOSIS — K50813 Crohn's disease of both small and large intestine with fistula: Secondary | ICD-10-CM

## 2024-06-09 DIAGNOSIS — Z7960 Long term (current) use of unspecified immunomodulators and immunosuppressants: Secondary | ICD-10-CM

## 2024-06-13 LAB — QUANTIFERON-TB GOLD PLUS
Mitogen-NIL: >10 [IU]/mL
NIL: 0.08 [IU]/mL
QuantiFERON-TB Gold Plus: NEGATIVE
TB1-NIL: 0.02 [IU]/mL
TB2-NIL: 0.05 [IU]/mL

## 2024-06-20 ENCOUNTER — Other Ambulatory Visit: Payer: Self-pay

## 2024-06-20 MED ORDER — ADALIMUMAB-RYVK (2 PEN) 40 MG/0.4ML ~~LOC~~ AJKT: 40.0000 mg | AUTO-INJECTOR | SUBCUTANEOUS | 1 refills | Status: AC

## 2024-07-05 ENCOUNTER — Encounter: Payer: Self-pay | Admitting: Internal Medicine

## 2024-07-06 ENCOUNTER — Telehealth: Payer: Self-pay

## 2024-07-06 ENCOUNTER — Other Ambulatory Visit (HOSPITAL_COMMUNITY): Payer: Self-pay

## 2024-07-06 NOTE — Telephone Encounter (Signed)
 Pharmacy Patient Advocate Encounter   Received notification from CoverMyMeds that prior authorization for Adalimumab -ryvk (2 Pen) 40MG /0.4ML auto-injector kit is required/requested.   Insurance verification completed.   The patient is insured through [employer].   Per test claim: PA required; PA started via CoverMyMeds. KEY [id number] . Waiting for clinical questions to populate.

## 2024-07-10 ENCOUNTER — Other Ambulatory Visit (HOSPITAL_COMMUNITY): Payer: Self-pay

## 2024-07-11 ENCOUNTER — Other Ambulatory Visit (HOSPITAL_COMMUNITY): Payer: Self-pay

## 2024-07-11 NOTE — Telephone Encounter (Signed)
 Patient notified

## 2024-07-11 NOTE — Telephone Encounter (Signed)
 Pharmacy Patient Advocate Encounter  Received notification from [employer] that Prior Authorization for Adalimumab -ryvk (2 Pen) 40MG /0.4ML auto-injector kit has been APPROVED from 06-22-2024 to 07-06-2025   PA #/Case ID/Reference #: [id number]

## 2024-07-15 ENCOUNTER — Other Ambulatory Visit: Payer: Self-pay | Admitting: Medical Genetics

## 2024-07-21 ENCOUNTER — Ambulatory Visit (INDEPENDENT_AMBULATORY_CARE_PROVIDER_SITE_OTHER): Payer: Self-pay | Admitting: Physician Assistant

## 2024-07-21 ENCOUNTER — Encounter: Payer: Self-pay | Admitting: Physician Assistant

## 2024-07-21 VITALS — BP 116/50 | HR 82 | Temp 98.2°F | Ht 69.0 in | Wt 221.0 lb

## 2024-07-21 DIAGNOSIS — I10 Essential (primary) hypertension: Secondary | ICD-10-CM

## 2024-07-21 DIAGNOSIS — Z79899 Other long term (current) drug therapy: Secondary | ICD-10-CM | POA: Diagnosis not present

## 2024-07-21 DIAGNOSIS — K50813 Crohn's disease of both small and large intestine with fistula: Secondary | ICD-10-CM

## 2024-07-21 DIAGNOSIS — H6993 Unspecified Eustachian tube disorder, bilateral: Secondary | ICD-10-CM

## 2024-07-21 DIAGNOSIS — J4540 Moderate persistent asthma, uncomplicated: Secondary | ICD-10-CM

## 2024-07-21 DIAGNOSIS — F418 Other specified anxiety disorders: Secondary | ICD-10-CM

## 2024-07-21 DIAGNOSIS — B370 Candidal stomatitis: Secondary | ICD-10-CM | POA: Diagnosis not present

## 2024-07-21 DIAGNOSIS — Z0000 Encounter for general adult medical examination without abnormal findings: Secondary | ICD-10-CM

## 2024-07-21 MED ORDER — ARNUITY ELLIPTA 50 MCG/ACT IN AEPB: 1.0000 | INHALATION_SPRAY | Freq: Every day | RESPIRATORY_TRACT | 3 refills | Status: DC

## 2024-07-21 MED ORDER — FLUCONAZOLE 150 MG PO TABS: ORAL_TABLET | ORAL | 0 refills | Status: AC

## 2024-07-21 NOTE — Patient Instructions (Addendum)
 SABRA

## 2024-07-21 NOTE — Progress Notes (Signed)
 "  Subjective:    [name] [name] is a 49 y.o. male and is here for a comprehensive physical exam.  HPI  There are no preventive care reminders to display for this patient.  Discussed the use of AI scribe software for clinical note transcription with the patient, who gave verbal consent to proceed.  History of Present Illness   [name] [name] is a 50 year old male who presents for Comprehensive Physical Exam (CPE) preventive care annual visit.  He drinks two energy drinks daily, each with 300 mg caffeine, plus additional coffee, sodas. He uses Ambien  for sleep and Xanax  during the day and has reduced Xanax  to 3 mg daily. His high caffeine intake began to help with afternoon fatigue while working at [employer] over the summer.  He has a thick coating on his tongue that peels off in sheets when scraped. He has not been using his Arnuity Ellipta  but has used his albuterol  inhaler more often after running out of Anoro. He is concerned this could be thrush, especially since he takes adalimumab  for ulcerative colitis.  He feels fluid in his ears with swishing sounds. He takes an oral allergy pill daily, alternating brands, but avoids nasal sprays due to discomfort.  He has ulcerative colitis treated with adalimumab , started about a year ago after switching from Humira  due to insurance.  He recently ended an 11-year relationship with his partner and feels somewhat less stressed overall but notes ongoing logistical challenges. He drinks alcohol occasionally.   Health Maintenance: Immunizations -- UpToDate  Colonoscopy -- UpToDate  PSA --  Lab Results  Component Value Date   PSA 0.41 11/21/2020   PSA 0.3 11/29/2019   PSA 0.40 07/07/2018   Diet -- healthy as able Sleep habits -- see above Exercise -- limited  Weight -- Weight: 221 lb (100.2 kg)  Recent weight history Wt Readings from Last 10 Encounters:  07/21/24 221 lb (100.2 kg)  06/08/24 219 lb (99.3 kg)  03/31/24 218 lb 14.4  oz (99.3 kg)  11/12/23 222 lb 8 oz (100.9 kg)  07/22/23 231 lb (104.8 kg)  05/14/23 232 lb (105.2 kg)  05/12/23 230 lb (104.3 kg)  01/28/23 228 lb (103.4 kg)  01/01/23 229 lb 4 oz (104 kg)  07/02/22 221 lb 3.2 oz (100.3 kg)   Body mass index is 32.64 kg/m.  Mood -- stable Alcohol use --  reports current alcohol use of about 3.0 standard drinks of alcohol per week.  Tobacco use --  Tobacco Use: Low Risk (07/21/2024)   Patient History    Smoking Tobacco Use: Never    Smokeless Tobacco Use: Never    Passive Exposure: Not on file    Eligible for Low Dose CT? no  UTD with eye doctor? yes UTD with dentist? no     07/21/2024    3:16 PM  Depression screen PHQ 2/9  Decreased Interest 1  Down, Depressed, Hopeless 1  PHQ - 2 Score 2  Altered sleeping 0  Tired, decreased energy 1  Change in appetite 1  Feeling bad or failure about yourself  1  Trouble concentrating 2  Moving slowly or fidgety/restless 0  Suicidal thoughts 0  PHQ-9 Score 7  Difficult doing work/chores Somewhat difficult    Other providers/specialists: Patient Care Team: Job Lukes, [place] as PCP - General (Physician Assistant)    PMHx, SurgHx, SocialHx, Medications, and Allergies were reviewed in the Visit Navigator and updated as appropriate.   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   COVID-19 06/2021   Crohn's disease (HCC) followed by dr avram   small and large intestines--  hx bowel resection 02/ 2009--  currently treated w/ humira    Essential hypertension, benign    History of CMV 10/14/2010   resolved w/ antiviral therapy   History of kidney stones    History of resection of small bowel    Hyperlipidemia    Major depressive disorder    OSA (obstructive sleep apnea)    01-05-2018 per pt resolved since tonsils removed    Ventral hernia    Vitamin D  deficiency      Past  Surgical History:  Procedure Laterality Date   ANAL FISTULOTOMY N/A 01/07/2018   Procedure: ANAL FISTULOTOMY;  Surgeon: Debby Hila, MD;  Location: [facility];  Service: General;  Laterality: N/A;   COLONOSCOPY  last one 04-2017   dr avram   EXPLORATORY LAPAROTOMY W/ ILEOSECECTOMY W/ PRIMARY ANASTAMOSIS  08-05-2007    dr gross  [facility]   crohn's, sbo, perferation's   GANGLION CYST EXCISION Right 2016   wrist   HYPOSPADIAS CORRECTION  CHILD   SPHINCTEROTOMY N/A 01/07/2018   Procedure: CHEMICAL SPHINCTEROTOMY (BOTOX );  Surgeon: Debby Hila, MD;  Location: [facility];  Service: General;  Laterality: N/A;   SPHINCTEROTOMY N/A 07/02/2022   Procedure: chemical SPHINCTEROTOMY;  Surgeon: Debby Hila, MD;  Location: [facility];  Service: General;  Laterality: N/A;   TONSILLECTOMY Bilateral 09/30/2015   Procedure: BILATERAL TONSILLECTOMY;  Surgeon: Ida Loader, MD;  Location: [facility];  Service: ENT;  Laterality: Bilateral;   UPPER GASTROINTESTINAL ENDOSCOPY  10/15/2010   w/biopsy, mild gastritis and duodenitis     Family History  Problem Relation Age of Onset   Obesity Mother    Allergies Mother    Multiple sclerosis Mother    Heart disease Father        CAD/MI   Hyperlipidemia Father    Hypertension Father    Gout Father    Sleep apnea Father    Myasthenia gravis Maternal Grandfather    Stroke Maternal Grandfather    Stroke Paternal Grandmother 81   Pancreatic cancer Paternal Grandfather 7   Colon cancer Neg Hx    Diabetes Neg Hx    COPD Neg Hx    Colon polyps Neg Hx    Stomach cancer Neg Hx     Social History[1]  Review of Systems:   Review of Systems  Constitutional:  Negative for chills, fever, malaise/fatigue and weight loss.  HENT:  Positive for congestion. Negative for hearing loss, sinus pain and sore throat.   Respiratory:  Negative for cough and hemoptysis.   Cardiovascular:  Negative for chest pain, palpitations,  leg swelling and PND.  Gastrointestinal:  Negative for abdominal pain, constipation, diarrhea, heartburn, nausea and vomiting.  Genitourinary:  Negative for dysuria, frequency and urgency.  Musculoskeletal:  Negative for back pain, myalgias and neck pain.  Skin:  Negative for itching and rash.  Neurological:  Negative for dizziness, tingling, seizures and headaches.  Endo/Heme/Allergies:  Negative for polydipsia.  Psychiatric/Behavioral:  Negative for depression. The patient is not nervous/anxious.     Objective:    Vitals:   07/21/24 1418  BP: (!) 116/50  Pulse: 82  Temp: 98.2 F (36.8 C)  SpO2: 98%    Body mass index is 32.64  kg/m.  General  Alert, cooperative, no distress, appears stated age  Head:  Normocephalic, without obvious abnormality, atraumatic  Eyes:  PERRL, conjunctiva/corneas clear, EOM's intact, fundi benign, both eyes       Ears:  Normal TM's and external ear canals, both ears  Nose: Nares normal, septum midline, mucosa normal, no drainage or sinus tenderness  Throat: Lips, mucosa wnl and tongue with thick white plaque at posterior aspect ; teeth and gums normal  Neck: Supple, symmetrical, trachea midline, no adenopathy;     thyroid :  No enlargement/tenderness/nodules; no carotid bruit or JVD  Back:   Symmetric, no curvature, ROM normal, no CVA tenderness  Lungs:   Clear to auscultation bilaterally, respirations unlabored  Chest wall:  No tenderness or deformity  Heart:  Regular rate and rhythm, S1 and S2 normal, no murmur, rub or gallop  Abdomen:   Soft, non-tender, bowel sounds active all four quadrants, no masses, no organomegaly  Extremities: Extremities normal, atraumatic, no cyanosis or edema  Prostate : Deferred   Skin: Skin color, texture, turgor normal, no rashes or lesions  Lymph nodes: Cervical, supraclavicular, and axillary nodes normal  Neurologic: CNII-XII grossly intact. Normal strength, sensation and reflexes throughout   AssessmentPlan:    Assessment and Plan    General Health Maintenance Routine health maintenance discussed. - Scheduled dental exam.  On pre-exposure prophylaxis for HIV  Currently UpToDate on bloodwork -recommend blood work and urinalysis around February 20th.   Oral candidiasis Thick tongue coating, possibly linked to steroid inhaler or adalimumab . Reluctant to use swish and spit treatment. - Prescribed Diflucan  (fluconazole ) oral tablets, one today, additional tablet in 3-5 days if needed, total of three tablets. - Advised brushing teeth after steroid inhaler use. - Recommended dental exam.  Eustachian tube dysfunction with middle ear effusion Fluid behind ears likely due to Eustachian tube dysfunction. Reluctant to use nasal spray. - Recommended medicated nasal spray morning and night for 2-3 weeks. - Advised doubling antihistamine dose if not too sedating.  Moderate persistent asthma, unspecified whether complicated  Increased rescue inhaler use due to running out of Anoro Ellipta. Irregular steroid inhaler use. - Prescribed Anoro Ellipta inhaler. - Advised regular steroid inhaler use and brushing teeth after use.  Crohn's disease of both small and large intestine with fistula (HCC)  Managed with adalimumab  for the past year. Discussed potential oral candidiasis side effect. - Continue adalimumab  as prescribed.  Essential hypertension, benign  Blood pressure well-controlled on current regimen. - Continue current antihypertensive medication regimen of lisinopril -hydrochloroTHIAZIDE  20-25 mg daily  Anxiety with Depression Feeling less stressed post-breakup.  Continue ongoing psychiatric care Reports there is no any need for further intervention at this time    Lucie Buttner, [name] [facility]         [1]  Social History Tobacco Use   Smoking status: Never   Smokeless tobacco: Never  Vaping Use   Vaping status: Never Used  Substance Use Topics   Alcohol use:  Yes    Alcohol/week: 3.0 standard drinks of alcohol    Types: 3 Shots of liquor per week    Comment: rare   Drug use: Yes    Types: Marijuana   "

## 2024-07-21 NOTE — Addendum Note (Signed)
 Addended by: Thaila Bottoms J on: 07/21/2024 04:01 PM   Modules accepted: Orders

## 2024-07-22 ENCOUNTER — Other Ambulatory Visit: Payer: Self-pay | Admitting: Physician Assistant

## 2024-07-25 ENCOUNTER — Other Ambulatory Visit: Payer: Self-pay | Admitting: Physician Assistant

## 2024-07-25 MED ORDER — ASMANEX HFA 100 MCG/ACT IN AERO: 1.0000 | INHALATION_SPRAY | Freq: Two times a day (BID) | RESPIRATORY_TRACT | 5 refills | Status: AC

## 2024-07-25 NOTE — Telephone Encounter (Signed)
 Please see message from the pharmacy. Okay to change order?

## 2024-07-27 MED ORDER — ONDANSETRON HCL 8 MG PO TABS: 8.0000 mg | ORAL_TABLET | Freq: Three times a day (TID) | ORAL | 1 refills | Status: AC | PRN

## 2024-07-27 NOTE — Telephone Encounter (Signed)
 Okay to fill Rx for Zofran  8 mg?

## 2024-07-27 NOTE — Telephone Encounter (Signed)
 Spoke to pt told him I am not sure you understood what I was asking. I have you are ordered a 4 mg tablet and I can order an 8 mg tablet so you only have to take one. Pt said he is already taking the 8 mg tablet that what is on his bottle. Told him okay we have the wrong Rx. I will send in the 8 mg tablet of Zofran  for you. Pt verbalized understanding. Rx sent.

## 2024-07-27 NOTE — Addendum Note (Signed)
 Addended by: THURMON ARLAND PARAS on: 07/27/2024 08:53 AM   Modules accepted: Orders
